# Patient Record
Sex: Female | Born: 1960 | Race: Black or African American | Hispanic: No | Marital: Married | Smoking: Former smoker
Health system: Southern US, Community
[De-identification: ages and names within clinical notes are randomized; demographics above are authoritative.]

## PROBLEM LIST (undated history)

## (undated) ENCOUNTER — Emergency Department (HOSPITAL_COMMUNITY): Discharge status: Absent without leave | Payer: BLUE CROSS/BLUE SHIELD

## (undated) DIAGNOSIS — I10 Essential (primary) hypertension: Secondary | ICD-10-CM

## (undated) DIAGNOSIS — G8929 Other chronic pain: Secondary | ICD-10-CM

## (undated) DIAGNOSIS — M79673 Pain in unspecified foot: Secondary | ICD-10-CM

## (undated) DIAGNOSIS — G4730 Sleep apnea, unspecified: Secondary | ICD-10-CM

## (undated) DIAGNOSIS — E785 Hyperlipidemia, unspecified: Secondary | ICD-10-CM

## (undated) DIAGNOSIS — M47812 Spondylosis without myelopathy or radiculopathy, cervical region: Secondary | ICD-10-CM

## (undated) DIAGNOSIS — F419 Anxiety disorder, unspecified: Secondary | ICD-10-CM

## (undated) DIAGNOSIS — E119 Type 2 diabetes mellitus without complications: Secondary | ICD-10-CM

## (undated) DIAGNOSIS — D573 Sickle-cell trait: Secondary | ICD-10-CM

## (undated) DIAGNOSIS — M779 Enthesopathy, unspecified: Secondary | ICD-10-CM

## (undated) DIAGNOSIS — M722 Plantar fascial fibromatosis: Secondary | ICD-10-CM

## (undated) DIAGNOSIS — D571 Sickle-cell disease without crisis: Secondary | ICD-10-CM

## (undated) DIAGNOSIS — G5600 Carpal tunnel syndrome, unspecified upper limb: Secondary | ICD-10-CM

## (undated) DIAGNOSIS — D649 Anemia, unspecified: Secondary | ICD-10-CM

## (undated) DIAGNOSIS — M797 Fibromyalgia: Secondary | ICD-10-CM

## (undated) DIAGNOSIS — K5190 Ulcerative colitis, unspecified, without complications: Secondary | ICD-10-CM

## (undated) HISTORY — PX: COLONOSCOPY: SHX174

## (undated) HISTORY — DX: Carpal tunnel syndrome, unspecified upper limb: G56.00

## (undated) HISTORY — DX: Sleep apnea, unspecified: G47.30

## (undated) HISTORY — DX: Other chronic pain: G89.29

## (undated) HISTORY — DX: Hyperlipidemia, unspecified: E78.5

## (undated) HISTORY — DX: Type 2 diabetes mellitus without complications: E11.9

## (undated) HISTORY — DX: Pain in unspecified foot: M79.673

## (undated) HISTORY — DX: Plantar fascial fibromatosis: M72.2

## (undated) HISTORY — DX: Anxiety disorder, unspecified: F41.9

## (undated) HISTORY — DX: Essential (primary) hypertension: I10

## (undated) HISTORY — PX: ROTATOR CUFF REPAIR: SHX139

## (undated) HISTORY — PX: ABDOMINAL HYSTERECTOMY: SHX81

## (undated) HISTORY — PX: ESOPHAGOGASTRODUODENOSCOPY: SHX1529

## (undated) HISTORY — PX: APPENDECTOMY: SHX54

## (undated) HISTORY — DX: Spondylosis without myelopathy or radiculopathy, cervical region: M47.812

## (undated) HISTORY — DX: Anemia, unspecified: D64.9

## (undated) HISTORY — DX: Sickle-cell disease without crisis: D57.1

## (undated) HISTORY — DX: Enthesopathy, unspecified: M77.9

---

## 2015-09-13 DIAGNOSIS — K51919 Ulcerative colitis, unspecified with unspecified complications: Secondary | ICD-10-CM | POA: Diagnosis not present

## 2015-09-13 DIAGNOSIS — R748 Abnormal levels of other serum enzymes: Secondary | ICD-10-CM | POA: Diagnosis not present

## 2015-10-25 DIAGNOSIS — Z87891 Personal history of nicotine dependence: Secondary | ICD-10-CM | POA: Diagnosis not present

## 2015-10-25 DIAGNOSIS — Z5181 Encounter for therapeutic drug level monitoring: Secondary | ICD-10-CM | POA: Diagnosis not present

## 2015-10-25 DIAGNOSIS — Z885 Allergy status to narcotic agent status: Secondary | ICD-10-CM | POA: Diagnosis not present

## 2015-10-25 DIAGNOSIS — K5190 Ulcerative colitis, unspecified, without complications: Secondary | ICD-10-CM | POA: Diagnosis not present

## 2015-11-15 DIAGNOSIS — R748 Abnormal levels of other serum enzymes: Secondary | ICD-10-CM | POA: Diagnosis not present

## 2015-11-26 DIAGNOSIS — Z885 Allergy status to narcotic agent status: Secondary | ICD-10-CM | POA: Diagnosis not present

## 2015-11-26 DIAGNOSIS — Z87891 Personal history of nicotine dependence: Secondary | ICD-10-CM | POA: Diagnosis not present

## 2015-11-26 DIAGNOSIS — K5190 Ulcerative colitis, unspecified, without complications: Secondary | ICD-10-CM | POA: Diagnosis not present

## 2015-12-27 DIAGNOSIS — Z885 Allergy status to narcotic agent status: Secondary | ICD-10-CM | POA: Diagnosis not present

## 2015-12-27 DIAGNOSIS — K5190 Ulcerative colitis, unspecified, without complications: Secondary | ICD-10-CM | POA: Diagnosis not present

## 2015-12-27 DIAGNOSIS — Z87891 Personal history of nicotine dependence: Secondary | ICD-10-CM | POA: Diagnosis not present

## 2016-01-25 DIAGNOSIS — K5190 Ulcerative colitis, unspecified, without complications: Secondary | ICD-10-CM | POA: Diagnosis not present

## 2016-01-25 DIAGNOSIS — Z87891 Personal history of nicotine dependence: Secondary | ICD-10-CM | POA: Diagnosis not present

## 2016-01-25 DIAGNOSIS — Z885 Allergy status to narcotic agent status: Secondary | ICD-10-CM | POA: Diagnosis not present

## 2016-02-21 DIAGNOSIS — D509 Iron deficiency anemia, unspecified: Secondary | ICD-10-CM | POA: Diagnosis not present

## 2016-02-21 DIAGNOSIS — K51919 Ulcerative colitis, unspecified with unspecified complications: Secondary | ICD-10-CM | POA: Diagnosis not present

## 2016-02-21 DIAGNOSIS — Z720 Tobacco use: Secondary | ICD-10-CM | POA: Diagnosis not present

## 2016-02-21 DIAGNOSIS — R1084 Generalized abdominal pain: Secondary | ICD-10-CM | POA: Diagnosis not present

## 2016-02-21 DIAGNOSIS — Z885 Allergy status to narcotic agent status: Secondary | ICD-10-CM | POA: Diagnosis not present

## 2016-02-21 DIAGNOSIS — Z8719 Personal history of other diseases of the digestive system: Secondary | ICD-10-CM | POA: Diagnosis not present

## 2016-03-20 DIAGNOSIS — K5190 Ulcerative colitis, unspecified, without complications: Secondary | ICD-10-CM | POA: Diagnosis not present

## 2016-03-20 DIAGNOSIS — Z885 Allergy status to narcotic agent status: Secondary | ICD-10-CM | POA: Diagnosis not present

## 2016-03-20 DIAGNOSIS — Z87891 Personal history of nicotine dependence: Secondary | ICD-10-CM | POA: Diagnosis not present

## 2016-04-18 DIAGNOSIS — K5190 Ulcerative colitis, unspecified, without complications: Secondary | ICD-10-CM | POA: Diagnosis not present

## 2016-04-18 DIAGNOSIS — Z87891 Personal history of nicotine dependence: Secondary | ICD-10-CM | POA: Diagnosis not present

## 2016-04-18 DIAGNOSIS — Z885 Allergy status to narcotic agent status: Secondary | ICD-10-CM | POA: Diagnosis not present

## 2016-05-16 DIAGNOSIS — K5100 Ulcerative (chronic) pancolitis without complications: Secondary | ICD-10-CM | POA: Diagnosis not present

## 2016-05-16 DIAGNOSIS — K2970 Gastritis, unspecified, without bleeding: Secondary | ICD-10-CM | POA: Diagnosis not present

## 2016-06-28 DIAGNOSIS — M7989 Other specified soft tissue disorders: Secondary | ICD-10-CM | POA: Diagnosis not present

## 2016-06-28 DIAGNOSIS — R0789 Other chest pain: Secondary | ICD-10-CM | POA: Diagnosis not present

## 2016-06-28 DIAGNOSIS — M25512 Pain in left shoulder: Secondary | ICD-10-CM | POA: Diagnosis not present

## 2016-06-28 DIAGNOSIS — R079 Chest pain, unspecified: Secondary | ICD-10-CM | POA: Diagnosis not present

## 2016-06-28 DIAGNOSIS — R200 Anesthesia of skin: Secondary | ICD-10-CM | POA: Diagnosis not present

## 2016-06-28 DIAGNOSIS — M79602 Pain in left arm: Secondary | ICD-10-CM | POA: Diagnosis not present

## 2016-06-28 DIAGNOSIS — K5190 Ulcerative colitis, unspecified, without complications: Secondary | ICD-10-CM | POA: Diagnosis not present

## 2016-06-29 DIAGNOSIS — R200 Anesthesia of skin: Secondary | ICD-10-CM | POA: Diagnosis not present

## 2016-06-29 DIAGNOSIS — M25512 Pain in left shoulder: Secondary | ICD-10-CM | POA: Diagnosis not present

## 2016-07-07 DIAGNOSIS — G5602 Carpal tunnel syndrome, left upper limb: Secondary | ICD-10-CM | POA: Diagnosis not present

## 2016-07-07 DIAGNOSIS — M654 Radial styloid tenosynovitis [de Quervain]: Secondary | ICD-10-CM | POA: Diagnosis not present

## 2016-07-07 DIAGNOSIS — G5601 Carpal tunnel syndrome, right upper limb: Secondary | ICD-10-CM | POA: Diagnosis not present

## 2016-07-10 ENCOUNTER — Encounter: Payer: Self-pay | Admitting: Cardiology

## 2016-07-10 DIAGNOSIS — M50122 Cervical disc disorder at C5-C6 level with radiculopathy: Secondary | ICD-10-CM | POA: Diagnosis not present

## 2016-07-10 DIAGNOSIS — M5412 Radiculopathy, cervical region: Secondary | ICD-10-CM | POA: Diagnosis not present

## 2016-07-22 DIAGNOSIS — M5412 Radiculopathy, cervical region: Secondary | ICD-10-CM | POA: Diagnosis not present

## 2016-07-22 DIAGNOSIS — G540 Brachial plexus disorders: Secondary | ICD-10-CM | POA: Diagnosis not present

## 2016-07-22 DIAGNOSIS — M542 Cervicalgia: Secondary | ICD-10-CM | POA: Diagnosis not present

## 2016-07-22 DIAGNOSIS — M792 Neuralgia and neuritis, unspecified: Secondary | ICD-10-CM | POA: Diagnosis not present

## 2016-08-01 DIAGNOSIS — G540 Brachial plexus disorders: Secondary | ICD-10-CM | POA: Diagnosis not present

## 2016-08-04 DIAGNOSIS — M654 Radial styloid tenosynovitis [de Quervain]: Secondary | ICD-10-CM | POA: Diagnosis not present

## 2016-08-04 DIAGNOSIS — G5601 Carpal tunnel syndrome, right upper limb: Secondary | ICD-10-CM | POA: Diagnosis not present

## 2016-08-04 DIAGNOSIS — G5602 Carpal tunnel syndrome, left upper limb: Secondary | ICD-10-CM | POA: Diagnosis not present

## 2016-08-13 DIAGNOSIS — M7542 Impingement syndrome of left shoulder: Secondary | ICD-10-CM | POA: Diagnosis not present

## 2016-08-13 DIAGNOSIS — M79672 Pain in left foot: Secondary | ICD-10-CM | POA: Diagnosis not present

## 2016-08-13 DIAGNOSIS — M19071 Primary osteoarthritis, right ankle and foot: Secondary | ICD-10-CM | POA: Diagnosis not present

## 2016-08-13 DIAGNOSIS — M7732 Calcaneal spur, left foot: Secondary | ICD-10-CM | POA: Diagnosis not present

## 2016-08-13 DIAGNOSIS — M5412 Radiculopathy, cervical region: Secondary | ICD-10-CM | POA: Diagnosis not present

## 2016-08-13 DIAGNOSIS — M79671 Pain in right foot: Secondary | ICD-10-CM | POA: Diagnosis not present

## 2016-08-13 DIAGNOSIS — G5603 Carpal tunnel syndrome, bilateral upper limbs: Secondary | ICD-10-CM | POA: Diagnosis not present

## 2016-08-13 DIAGNOSIS — M7731 Calcaneal spur, right foot: Secondary | ICD-10-CM | POA: Diagnosis not present

## 2016-08-13 DIAGNOSIS — G5761 Lesion of plantar nerve, right lower limb: Secondary | ICD-10-CM | POA: Diagnosis not present

## 2016-08-13 DIAGNOSIS — M722 Plantar fascial fibromatosis: Secondary | ICD-10-CM | POA: Diagnosis not present

## 2016-08-15 DIAGNOSIS — M5412 Radiculopathy, cervical region: Secondary | ICD-10-CM | POA: Diagnosis not present

## 2016-08-15 DIAGNOSIS — M792 Neuralgia and neuritis, unspecified: Secondary | ICD-10-CM | POA: Diagnosis not present

## 2016-08-15 DIAGNOSIS — G540 Brachial plexus disorders: Secondary | ICD-10-CM | POA: Diagnosis not present

## 2016-08-15 DIAGNOSIS — M542 Cervicalgia: Secondary | ICD-10-CM | POA: Diagnosis not present

## 2016-08-20 DIAGNOSIS — N63 Unspecified lump in unspecified breast: Secondary | ICD-10-CM | POA: Diagnosis not present

## 2016-08-20 DIAGNOSIS — N6459 Other signs and symptoms in breast: Secondary | ICD-10-CM | POA: Diagnosis not present

## 2016-08-20 DIAGNOSIS — R922 Inconclusive mammogram: Secondary | ICD-10-CM | POA: Diagnosis not present

## 2016-08-20 DIAGNOSIS — R928 Other abnormal and inconclusive findings on diagnostic imaging of breast: Secondary | ICD-10-CM | POA: Diagnosis not present

## 2016-08-25 DIAGNOSIS — G5603 Carpal tunnel syndrome, bilateral upper limbs: Secondary | ICD-10-CM | POA: Diagnosis not present

## 2016-09-10 DIAGNOSIS — R202 Paresthesia of skin: Secondary | ICD-10-CM | POA: Diagnosis not present

## 2016-09-10 DIAGNOSIS — M7741 Metatarsalgia, right foot: Secondary | ICD-10-CM | POA: Diagnosis not present

## 2016-09-10 DIAGNOSIS — G5762 Lesion of plantar nerve, left lower limb: Secondary | ICD-10-CM | POA: Diagnosis not present

## 2016-09-10 DIAGNOSIS — M7661 Achilles tendinitis, right leg: Secondary | ICD-10-CM | POA: Diagnosis not present

## 2016-10-13 DIAGNOSIS — Z1389 Encounter for screening for other disorder: Secondary | ICD-10-CM | POA: Diagnosis not present

## 2016-10-13 DIAGNOSIS — N390 Urinary tract infection, site not specified: Secondary | ICD-10-CM | POA: Diagnosis not present

## 2016-10-13 DIAGNOSIS — Z1231 Encounter for screening mammogram for malignant neoplasm of breast: Secondary | ICD-10-CM | POA: Diagnosis not present

## 2016-10-13 DIAGNOSIS — R918 Other nonspecific abnormal finding of lung field: Secondary | ICD-10-CM | POA: Diagnosis not present

## 2016-10-13 DIAGNOSIS — R945 Abnormal results of liver function studies: Secondary | ICD-10-CM | POA: Diagnosis not present

## 2017-02-06 ENCOUNTER — Encounter (HOSPITAL_COMMUNITY): Payer: Self-pay | Admitting: Family Medicine

## 2017-02-06 ENCOUNTER — Ambulatory Visit (HOSPITAL_COMMUNITY)
Admission: EM | Admit: 2017-02-06 | Discharge: 2017-02-06 | Disposition: A | Discharge status: Home / Self Care | Payer: Medicare Other | Attending: Internal Medicine | Admitting: Internal Medicine

## 2017-02-06 DIAGNOSIS — M79672 Pain in left foot: Secondary | ICD-10-CM | POA: Diagnosis not present

## 2017-02-06 DIAGNOSIS — M2012 Hallux valgus (acquired), left foot: Secondary | ICD-10-CM

## 2017-02-06 DIAGNOSIS — M722 Plantar fascial fibromatosis: Secondary | ICD-10-CM | POA: Diagnosis not present

## 2017-02-06 DIAGNOSIS — M79671 Pain in right foot: Secondary | ICD-10-CM

## 2017-02-06 DIAGNOSIS — M2011 Hallux valgus (acquired), right foot: Secondary | ICD-10-CM | POA: Diagnosis not present

## 2017-02-06 HISTORY — DX: Ulcerative colitis, unspecified, without complications: K51.90

## 2017-02-06 MED ORDER — MELOXICAM 15 MG PO TABS: 15.0000 mg | ORAL_TABLET | Freq: Every day | ORAL | 0 refills | Status: DC

## 2017-02-06 NOTE — ED Provider Notes (Signed)
CSN: [id number]     Arrival date & time 02/06/17  1311 History   First MD Initiated Contact with Patient 02/06/17 1600     Chief Complaint  Patient presents with  . Foot Pain   (Consider location/radiation/quality/duration/timing/severity/associated sxs/prior Treatment) 56 year old female who moved to the area from [place] approximately 2 months ago. She is complaining of feet pain and swelling for 2-3 weeks. She has a history of plantar fasciitis bilaterally. She saw a podiatrist approximate 6 months ago in [place] and was given injections into the heels of her feet. She was advised to use cold, rolling the bottom of her feet over a frozen can as well as to wear shoes supports. She describes supports that sound like it is more for heel cushion than actual arch support as recommended for plantar fasciitis. Denies any known injury. Denies being on her feet for prolonged period of time.      Past Medical History:  Diagnosis Date  . UC (ulcerative colitis) ([place])    History reviewed. No pertinent surgical history. History reviewed. No pertinent family history. Social History  Substance Use Topics  . Smoking status: Never Smoker  . Smokeless tobacco: Never Used  . Alcohol use Not on file   OB History    No data available     Review of Systems  Constitutional: Negative.  Negative for activity change, chills and fever.  HENT: Negative.   Respiratory: Negative.   Cardiovascular: Negative.   Musculoskeletal:       As per HPI  Skin: Negative for color change, pallor and rash.  Neurological: Negative.   All other systems reviewed and are negative.   Allergies  Codeine and Latex  Home Medications   Prior to Admission medications   Medication Sig Start Date End Date Taking? Authorizing Provider  meloxicam (MOBIC) 15 MG tablet Take 1 tablet (15 mg total) by mouth daily. Prn pain 02/06/17   Janne Napoleon, NP   Meds Ordered and Administered this Visit  Medications - No data to  display  BP 135/82 (BP Location: Right Arm)   Pulse 87   Temp 98.6 F (37 C) (Oral)   Resp 16   SpO2 99%  No data found.   Physical Exam  Constitutional: She is oriented to person, place, and time. She appears well-developed and well-nourished. No distress.  HENT:  Head: Normocephalic and atraumatic.  Eyes: EOM are normal. Pupils are equal, round, and reactive to light.  Neck: Neck supple.  Pulmonary/Chest: Effort normal.  Musculoskeletal: She exhibits tenderness. She exhibits no edema.  Today the feet have no evidence of swelling. The plantar aspect of the heels are tender, left greater than right. There is tenderness to the dorsum of both feet. Tenderness to the base of the great toes over the MCPs. Both toes demonstrate hallux valgus. No other obvious deformities. Normal warmth and color. Pedal pulse 2+. No lesions of the skin.  Neurological: She is alert and oriented to person, place, and time. No cranial nerve deficit.  Skin: Skin is warm and dry. Capillary refill takes less than 2 seconds.  Psychiatric: She has a normal mood and affect.  Nursing note and vitals reviewed.   Urgent Care Course     Procedures (including critical care time)  Labs Review Labs Reviewed - No data to display  Imaging Review No results found.   Visual Acuity Review  Right Eye Distance:   Left Eye Distance:   Bilateral Distance:    Right Eye  Near:   Left Eye Near:    Bilateral Near:         MDM   1. Plantar fasciitis, bilateral   2. Hallux valgus, acquired, bilateral   3. Foot pain, bilateral   Injections were offered but explanation of the procedure resulted in the choice for more conservative tx first. Obtain the full-length shoe inserts with a high arch and wear them whenever you are walking. Rolled the bottom of your feet over frozen can 3 or 4 times a day. Follow-up with podiatrist in call for an appointment early. Meds ordered this encounter  Medications  . meloxicam  (MOBIC) 15 MG tablet    Sig: Take 1 tablet (15 mg total) by mouth daily. Prn pain    Dispense:  14 tablet    Refill:  0    Order Specific Question:   Supervising Provider    Answer:   Sherlene Shams [[id number]]       Janne Napoleon, NP 02/06/17 [phone]

## 2017-02-06 NOTE — ED Triage Notes (Signed)
Pt here for bilateral foot pain x 3 weeks. sts more on the left side. sts she has been soaking them.  sts she previously saw a podiatrist and used to get shots.

## 2017-02-06 NOTE — Discharge Instructions (Signed)
Obtain the full-length shoe inserts with a high arch and wear them whenever you are walking. Rolled the bottom of your feet over frozen can 3 or 4 times a day. Follow-up with podiatrist in call for an appointment early.

## 2017-02-12 DIAGNOSIS — K51019 Ulcerative (chronic) pancolitis with unspecified complications: Secondary | ICD-10-CM | POA: Diagnosis not present

## 2017-02-13 DIAGNOSIS — G5761 Lesion of plantar nerve, right lower limb: Secondary | ICD-10-CM | POA: Diagnosis not present

## 2017-02-13 DIAGNOSIS — M7661 Achilles tendinitis, right leg: Secondary | ICD-10-CM | POA: Diagnosis not present

## 2017-02-13 DIAGNOSIS — M7662 Achilles tendinitis, left leg: Secondary | ICD-10-CM | POA: Diagnosis not present

## 2017-02-14 DIAGNOSIS — R109 Unspecified abdominal pain: Secondary | ICD-10-CM | POA: Diagnosis not present

## 2017-02-14 DIAGNOSIS — R197 Diarrhea, unspecified: Secondary | ICD-10-CM | POA: Diagnosis not present

## 2017-05-05 ENCOUNTER — Encounter (HOSPITAL_COMMUNITY): Payer: Self-pay | Admitting: Emergency Medicine

## 2017-05-05 ENCOUNTER — Ambulatory Visit (HOSPITAL_COMMUNITY)
Admission: EM | Admit: 2017-05-05 | Discharge: 2017-05-05 | Disposition: A | Discharge status: Home / Self Care | Payer: Medicare Other | Attending: Emergency Medicine | Admitting: Emergency Medicine

## 2017-05-05 DIAGNOSIS — R21 Rash and other nonspecific skin eruption: Secondary | ICD-10-CM

## 2017-05-05 MED ORDER — METHYLPREDNISOLONE ACETATE 80 MG/ML IJ SUSP
INTRAMUSCULAR | Status: AC
  Filled 2017-05-05: qty 1

## 2017-05-05 MED ORDER — METHYLPREDNISOLONE ACETATE 80 MG/ML IJ SUSP
80.0000 mg | Freq: Once | INTRAMUSCULAR | Status: AC
  Administered 2017-05-05: 80 mg via INTRAMUSCULAR

## 2017-05-05 MED ORDER — PREDNISONE 10 MG (21) PO TBPK: ORAL_TABLET | ORAL | 0 refills | Status: DC

## 2017-05-05 MED ORDER — TRIAMCINOLONE ACETONIDE 0.1 % EX CREA: 1.0000 "application " | TOPICAL_CREAM | Freq: Two times a day (BID) | CUTANEOUS | 2 refills | Status: DC

## 2017-05-05 MED ORDER — HYDROXYZINE HCL 25 MG PO TABS: 25.0000 mg | ORAL_TABLET | Freq: Four times a day (QID) | ORAL | 0 refills | Status: DC

## 2017-05-05 NOTE — ED Provider Notes (Signed)
  [place]   [id number] 05/05/17 Arrival Time: 1656   SUBJECTIVE:  Beverly Ramirez is a 56 y.o. female who presents to the urgent care with complaint of pruritic rash that is been ongoing for 2 weeks and worsening. States his symptoms started after working in her flower bed, has no new medications, new soaps, new detergents, or other sources allergen, no other person in her family is currently experiencing similar symptoms. She has no fever or chills, nausea or vomiting, or other systemic symptoms  ROS: As per HPI, remainder of ROS negative.   OBJECTIVE:  Vitals:   05/05/17 1734 05/05/17 1735  BP: (!) 138/93   Pulse: 82   Resp: 16   Temp: 98.3 F (36.8 C)   TempSrc: Oral   SpO2: 100%   Weight:  165 lb (74.8 kg)  Height:  5' 2"  (1.575 m)     General appearance: alert; no distress HEENT: normocephalic; atraumatic; conjunctivae normal;  Lungs: clear to auscultation bilaterally Heart: regular rate and rhythm Abdomen: soft, non-tender Musculoskeletal: Grossly symmetrical Skin: warm and dry, multiple, erythemic, scaling lesions on the hands, arms, legs, and feet Neurologic: Grossly normal Psychological:  alert and cooperative; normal mood and affect     ASSESSMENT & PLAN:  1. Rash and nonspecific skin eruption     Meds ordered this encounter  Medications  . methylPREDNISolone acetate (DEPO-MEDROL) injection 80 mg  . predniSONE (STERAPRED UNI-PAK 21 TAB) 10 MG (21) TBPK tablet    Sig: Take 6 tablets tomorrow, decrease by 1 each day till finished (6,5,4,3,2,1)    Dispense:  21 tablet    Refill:  0    Order Specific Question:   Supervising Provider    Answer:   Melynda Ripple [[id number]]  . triamcinolone cream (KENALOG) 0.1 %    Sig: Apply 1 application topically 2 (two) times daily.    Dispense:  30 g    Refill:  2    Order Specific Question:   Supervising Provider    Answer:   Melynda Ripple [[id number]]  . hydrOXYzine (ATARAX/VISTARIL) 25 MG tablet    Sig: Take 1 tablet (25 mg total) by mouth every 6 (six) hours.    Dispense:  30 tablet    Refill:  0    Order Specific Question:   Supervising Provider    Answer:   Melynda Ripple [[id number]]    Reviewed expectations re: course of current medical issues. Questions answered. Outlined signs and symptoms indicating need for more acute intervention. Patient verbalized understanding. After Visit Summary given.    Procedures:     No results found for this or any previous visit.  Labs Reviewed - No data to display  No results found.  Allergies  Allergen Reactions  . Codeine   . Latex     PMHx, SurgHx, SocialHx, Medications, and Allergies were reviewed in the Visit Navigator and updated as appropriate.       Barnet Glasgow, NP 05/05/17 1840

## 2017-05-05 NOTE — Discharge Instructions (Signed)
I have attached the contact information for [facility], contact them to schedule an appointment to establish for primary care.   For your rash, you have been given injection of Depo-Medrol here, started on a prednisone taper to take starting tomorrow, and for itching Atarax, one tablet every 6 hours as needed. This may cause drowsiness, do not drink alcohol, and do not drive until this medicine affects you.  For your hands, and given you Kenalog cream, apply 2-3 times a day as needed

## 2017-05-05 NOTE — ED Triage Notes (Signed)
PT reports an itchy rash on arms and legs for two weeks. It started after working in her flower beds.

## 2017-05-13 ENCOUNTER — Encounter: Payer: Self-pay | Admitting: Internal Medicine

## 2017-05-26 ENCOUNTER — Ambulatory Visit (INDEPENDENT_AMBULATORY_CARE_PROVIDER_SITE_OTHER): Payer: Medicare Other

## 2017-05-26 ENCOUNTER — Encounter: Payer: Self-pay | Admitting: Podiatry

## 2017-05-26 ENCOUNTER — Ambulatory Visit (INDEPENDENT_AMBULATORY_CARE_PROVIDER_SITE_OTHER): Payer: Medicare Other | Admitting: Podiatry

## 2017-05-26 DIAGNOSIS — M722 Plantar fascial fibromatosis: Secondary | ICD-10-CM | POA: Diagnosis not present

## 2017-05-26 DIAGNOSIS — G8929 Other chronic pain: Secondary | ICD-10-CM | POA: Diagnosis not present

## 2017-05-26 DIAGNOSIS — M79673 Pain in unspecified foot: Secondary | ICD-10-CM

## 2017-05-26 MED ORDER — MELOXICAM 15 MG PO TABS: 15.0000 mg | ORAL_TABLET | Freq: Every day | ORAL | 2 refills | Status: DC

## 2017-05-26 NOTE — Patient Instructions (Signed)

## 2017-05-26 NOTE — Progress Notes (Signed)
   Subjective:    Patient ID: Beverly Ramirez, female    DOB: 1961-04-10, 56 y.o.   MRN: [id number]  HPI  56 year old female presents the office today for concerns of bilateral heel pain. She previously was being treated by a physician in [place]. She states that when she was there she had one injection to the bottom of the heels which did help some but she did not want another injection. She also had a wrap to her foot. She is also dispensed a night splint which did not help. She also appears to his diagnosis the neuroma however she was having no symptoms this point. Her main concern is pain in the bottom of the heels which is been ongoing for about 1.5 years but over the last years become more continual to the bottom of the heel. She describes a throbbing, sharp sensation of the heel. She denies a sharp, shooting symptoms. She denies any swelling or redness. The pain does not wake her up at night. She has no other concerns. Review of Systems  All other systems reviewed and are negative.      Objective:   Physical Exam General: AAO x3, NAD  Dermatological: Skin is warm, dry and supple bilateral. Nails x 10 are well manicured; remaining integument appears unremarkable at this time. There are no open sores, no preulcerative lesions, no rash or signs of infection present.  Vascular: Dorsalis Pedis artery and Posterior Tibial artery pedal pulses are 2/4 bilateral with immedate capillary fill time.There is no pain with calf compression, swelling, warmth, erythema.   Neruologic: Grossly intact via light touch bilateral.  Protective threshold with Semmes Wienstein monofilament intact to all pedal sites bilateral. Negative tinel sign.   Musculoskeletal: Tenderness to palpation along the plantar medial tubercle of the calcaneus at the insertion of plantar fascia on the left and right foot. There is no pain along the course of the plantar fascia within the arch of the foot. Plantar fascia appears to  be intact. There is no pain with lateral compression of the calcaneus or pain with vibratory sensation. There is no pain along the course or insertion of the achilles tendon. No other areas of tenderness to bilateral lower extremities. Muscular strength 5/5 in all groups tested bilateral. There is no pain on the interspaces is no palpable neuroma identified today.  Gait: Unassisted, Nonantalgic.      Assessment & Plan:  56 year old female presents the office they for bilateral heel pain, chronic likely plantar fasciitis  -Treatment options discussed including all alternatives, risks, and complications -Etiology of symptoms were discussed -X-rays were obtained and reviewed with the patient. No evidence of acute fracture identified.  -Prescribed mobic. Discussed side effects of the medication and directed to stop if any are to occur and call the office.  -She wishes to hold off on another steroid injection -Stretching, icing exercises daily.  -Plantar fascial brace dispensed bilaterally  -I do think that should benefit from custom orthotics given her chronic nature the plantar fasciitis. She has multiple with orthotics today. She was aware of the charge.  -RTC 3 weeks or sooner if needed.   Celesta Gentile, DPM

## 2017-05-27 DIAGNOSIS — M79673 Pain in unspecified foot: Secondary | ICD-10-CM

## 2017-05-28 DIAGNOSIS — M722 Plantar fascial fibromatosis: Secondary | ICD-10-CM | POA: Insufficient documentation

## 2017-05-30 ENCOUNTER — Ambulatory Visit (HOSPITAL_COMMUNITY)
Admission: EM | Admit: 2017-05-30 | Discharge: 2017-05-30 | Disposition: A | Discharge status: Home / Self Care | Payer: Medicare Other | Attending: Family Medicine | Admitting: Family Medicine

## 2017-05-30 ENCOUNTER — Ambulatory Visit (INDEPENDENT_AMBULATORY_CARE_PROVIDER_SITE_OTHER): Payer: Medicare Other

## 2017-05-30 ENCOUNTER — Encounter (HOSPITAL_COMMUNITY): Payer: Self-pay | Admitting: Family Medicine

## 2017-05-30 DIAGNOSIS — S6992XA Unspecified injury of left wrist, hand and finger(s), initial encounter: Secondary | ICD-10-CM | POA: Diagnosis not present

## 2017-05-30 DIAGNOSIS — M25532 Pain in left wrist: Secondary | ICD-10-CM

## 2017-05-30 IMAGING — DX DG WRIST COMPLETE 3+V*L*
3 series · 3 of 3 positions shown · non-contrast
Comparison: None.

CLINICAL DATA: Fall down steps today injuring left wrist.

EXAM:
LEFT WRIST - COMPLETE 3+ VIEW

[wrist pa]
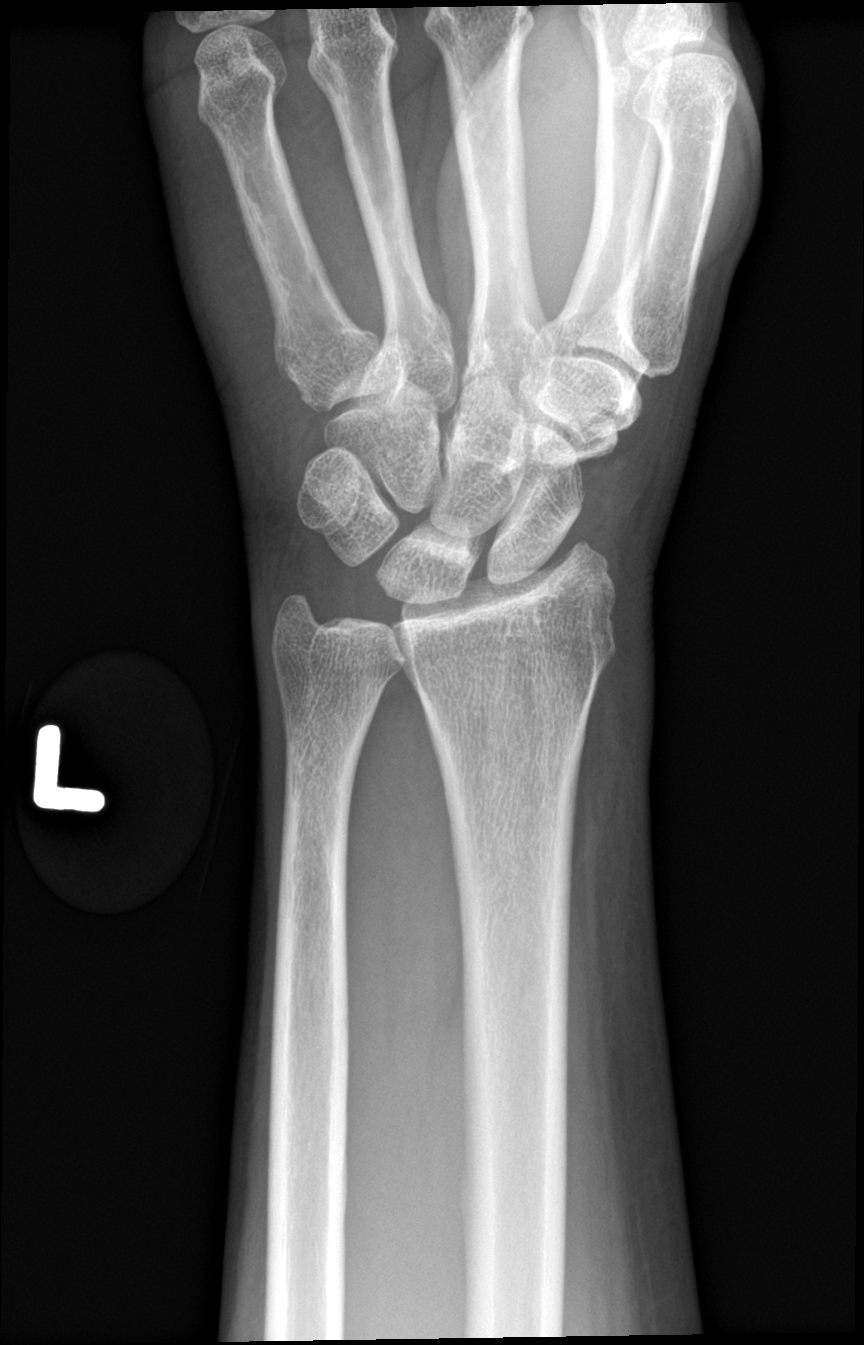

[wrist obl]
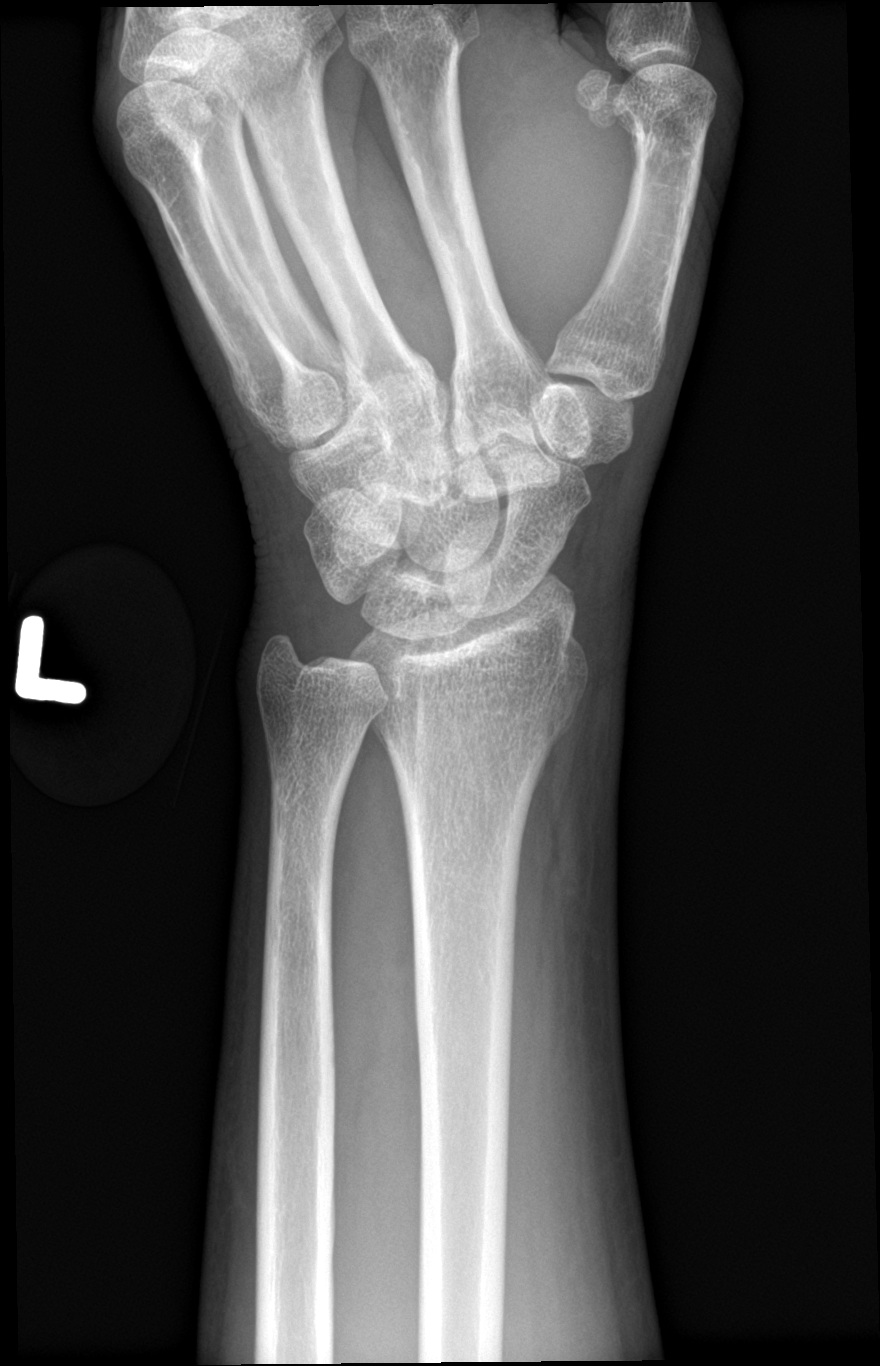

[wrist lat]
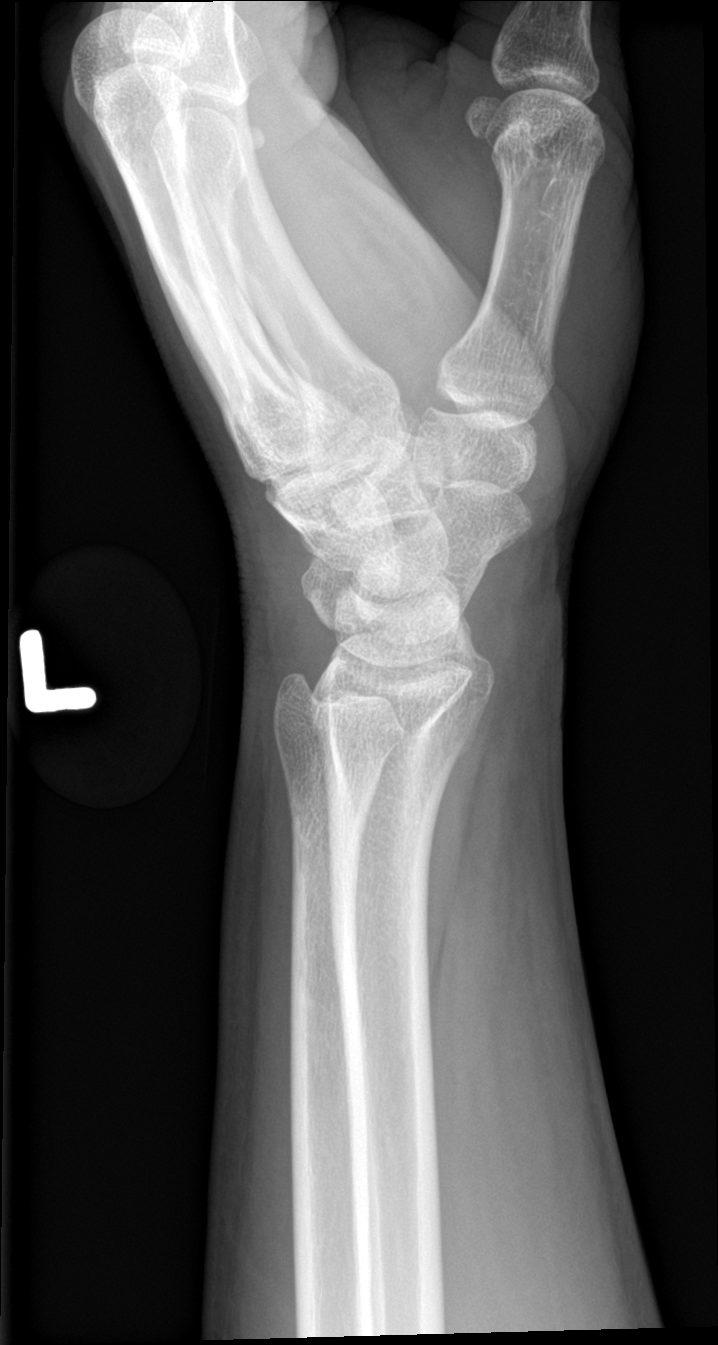

[3 of 3 positions shown; findings below may reference images not displayed]

FINDINGS: There is no evidence of fracture or dislocation. There is no
evidence of arthropathy or other focal bone abnormality. Soft
tissues are unremarkable.
IMPRESSION: Negative.

## 2017-05-30 NOTE — ED Notes (Signed)
No answer

## 2017-05-30 NOTE — ED Triage Notes (Signed)
Per pt she had a fall today mopping and landed on left arm. Pain in hand, wrist and FA.

## 2017-06-01 NOTE — ED Provider Notes (Signed)
  [place]   [id number] 05/30/17 Arrival Time: 2637  ASSESSMENT & PLAN:  1. Wrist pain, left    Placed in wrist splint to wear for at least one week. OTC analgesics as needed. F/U if needed. No fracture seen on x-rays tonight. Reviewed expectations re: course of current medical issues. Questions answered. Outlined signs and symptoms indicating need for more acute intervention. Patient verbalized understanding. After Visit Summary given.   SUBJECTIVE:  Beverly Ramirez is a 56 y.o. female who presents with complaint of L wrist discomfort after falling and landing on the side of her L arm. Gradual onset. Worse with certain movement. Better when holding still. No analgesics taken. No arm sensation changes. No previous injury reported.  ROS: As per HPI.   OBJECTIVE:  Vitals:   05/30/17 1820  BP: (!) 148/86  Pulse: 71  Resp: 18  Temp: 98.6 F (37 C)  SpO2: 100%    General appearance: alert; no distress Extremities: L forearm with tenderness over wrist but poorly localized; FROM with discomfort; normal sensation and grip; normal capillary refill Skin: warm and dry Psychological: alert and cooperative; normal mood and affect  Imaging: Dg Wrist Complete Left  Result Date: 05/30/2017 CLINICAL DATA:  Fall down steps today injuring left wrist. EXAM: LEFT WRIST - COMPLETE 3+ VIEW COMPARISON:  None. FINDINGS: There is no evidence of fracture or dislocation. There is no evidence of arthropathy or other focal bone abnormality. Soft tissues are unremarkable. IMPRESSION: Negative. Electronically Signed   By: Marin Olp M.D.   On: 05/30/2017 19:17    Allergies  Allergen Reactions  . Codeine Itching, Rash and Hives  . Latex     Past Medical History:  Diagnosis Date  . UC (ulcerative colitis) [facility])    Social History   Social History  . Marital status: Married    Spouse name: N/A  . Number of children: N/A  . Years of education: N/A   Occupational History    . Not on file.   Social History Main Topics  . Smoking status: Never Smoker  . Smokeless tobacco: Never Used  . Alcohol use Not on file  . Drug use: Unknown  . Sexual activity: Not on file   Other Topics Concern  . Not on file   Social History Narrative  . No narrative on file      Vanessa Kick, MD 06/01/17 [phone]

## 2017-06-10 ENCOUNTER — Encounter (HOSPITAL_COMMUNITY): Payer: Self-pay | Admitting: Emergency Medicine

## 2017-06-10 ENCOUNTER — Ambulatory Visit (INDEPENDENT_AMBULATORY_CARE_PROVIDER_SITE_OTHER): Payer: Medicare Other

## 2017-06-10 ENCOUNTER — Ambulatory Visit (HOSPITAL_COMMUNITY)
Admission: EM | Admit: 2017-06-10 | Discharge: 2017-06-10 | Disposition: A | Discharge status: Home / Self Care | Payer: Medicare Other | Attending: Family Medicine | Admitting: Family Medicine

## 2017-06-10 DIAGNOSIS — M25532 Pain in left wrist: Secondary | ICD-10-CM

## 2017-06-10 IMAGING — DX DG WRIST COMPLETE 3+V*L*
4 series · 4 of 4 positions shown · non-contrast
Comparison: Radiographs [DATE].

CLINICAL DATA: Left wrist pain after fall down stairs 1 month ago.

EXAM:
LEFT WRIST - COMPLETE 3+ VIEW

[wrist pa]
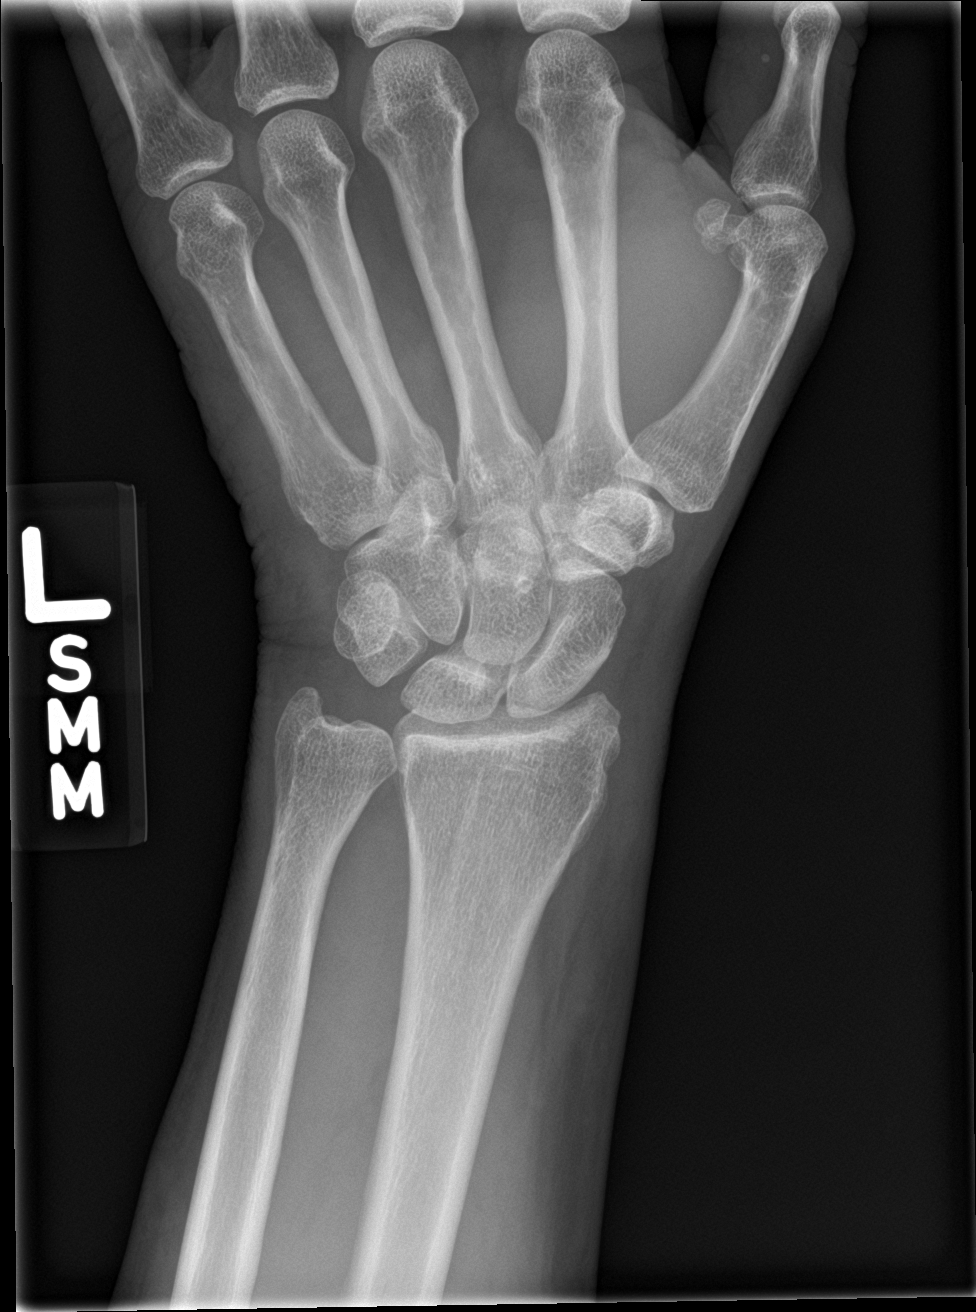

[wrist navicular]
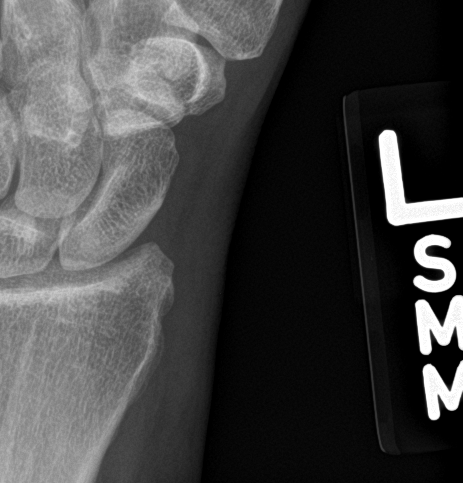

[wrist obl]
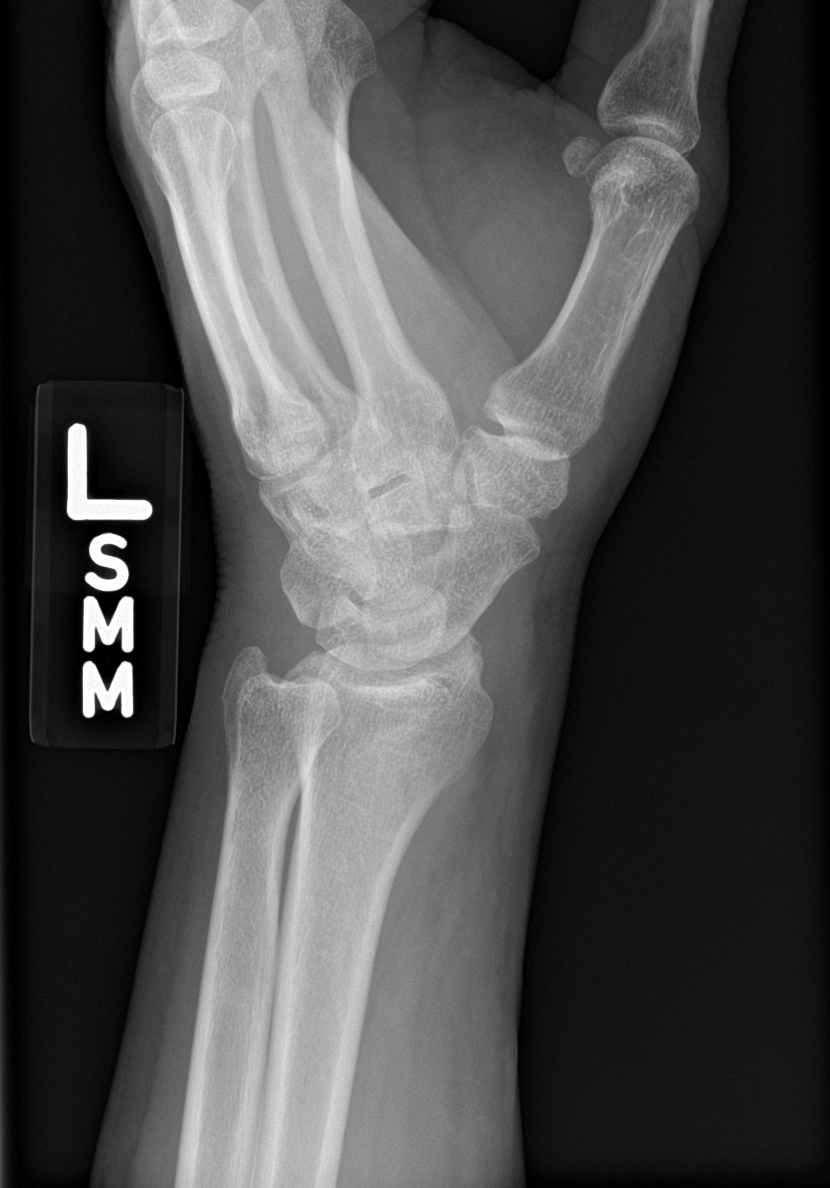

[wrist lat]
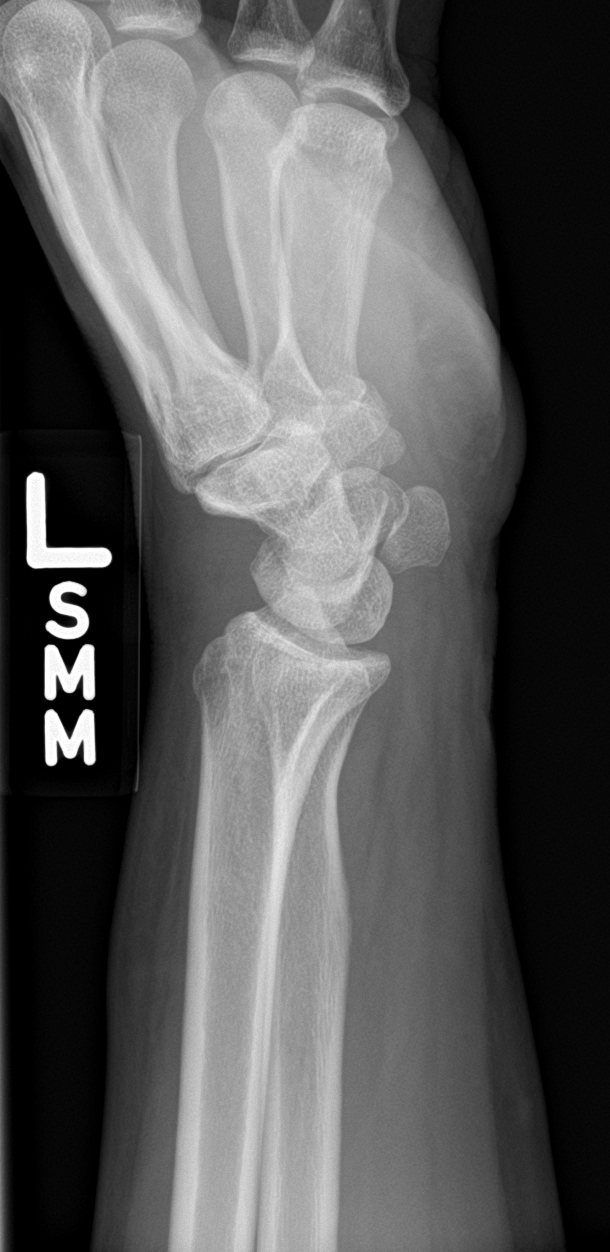

[4 of 4 positions shown; findings below may reference images not displayed]

FINDINGS: There is no evidence of fracture or dislocation. There is no
evidence of arthropathy or other focal bone abnormality. Soft
tissues are unremarkable.
IMPRESSION: Normal left wrist.

## 2017-06-10 MED ORDER — PREDNISONE 10 MG (21) PO TBPK: ORAL_TABLET | Freq: Every day | ORAL | 0 refills | Status: DC

## 2017-06-10 NOTE — ED Triage Notes (Signed)
PT reports she was seen here 2 weeks ago for left wrist pain. PT has been wearing her brace. Pain is not improving.

## 2017-06-13 NOTE — ED Provider Notes (Signed)
  [place]   [id number] 06/10/17 Arrival Time: 9191  ASSESSMENT & PLAN:  1. Left wrist pain     Meds ordered this encounter  Medications  . predniSONE (STERAPRED UNI-PAK 21 TAB) 10 MG (21) TBPK tablet    Sig: Take by mouth daily. Take as directed.    Dispense:  21 tablet    Refill:  0   Stop wearing wrist splint. Trial of prednisone. Will need orthopaedic evaluation if not improving. Reviewed expectations re: course of current medical issues. Questions answered. Outlined signs and symptoms indicating need for more acute intervention. Patient verbalized understanding. After Visit Summary given.   SUBJECTIVE:  Beverly Ramirez is a 56 y.o. female who presents with complaint of continuing L wrist discomfort. Wearing splint. See last note. No better but no worse. "Feels stiff." Has not been moving it much. No extremity sensation changes or weakness. No specific aggravating or alleviating factors reported.  ROS: As per HPI.   OBJECTIVE:  Vitals:   06/10/17 1447 06/10/17 1448  BP: 124/88   Pulse: 69   Resp: 16   Temp: 98.5 F (36.9 C)   TempSrc: Oral   SpO2: 100%   Weight:  165 lb (74.8 kg)  Height:  5' 2"  (1.575 m)    General appearance: alert; no distress Extremities: L wrist with vague tenderness on exam; FROM; no gross abnormalities Skin: warm and dry Neurologic: normal gait; normal symmetric reflexes Psychological: alert and cooperative; normal mood and affect  Imaging: Dg Wrist Complete Left  Result Date: 06/10/2017 CLINICAL DATA:  Left wrist pain after fall down stairs 1 month ago. EXAM: LEFT WRIST - COMPLETE 3+ VIEW COMPARISON:  Radiographs of May 30, 2017. FINDINGS: There is no evidence of fracture or dislocation. There is no evidence of arthropathy or other focal bone abnormality. Soft tissues are unremarkable. IMPRESSION: Normal left wrist. Electronically Signed   By: Marijo Conception, M.D.   On: 06/10/2017 15:44    Allergies    Allergen Reactions  . Codeine Itching, Rash and Hives  . Latex     Past Medical History:  Diagnosis Date  . UC (ulcerative colitis) [facility])    Social History   Social History  . Marital status: Married    Spouse name: N/A  . Number of children: N/A  . Years of education: N/A   Occupational History  . Not on file.   Social History Main Topics  . Smoking status: Never Smoker  . Smokeless tobacco: Never Used  . Alcohol use No  . Drug use: No  . Sexual activity: Not on file   Other Topics Concern  . Not on file   Social History Narrative  . No narrative on file   No family history on file. Past Surgical History:  Procedure Laterality Date  . ABDOMINAL HYSTERECTOMY    . APPENDECTOMY    . CESAREAN SECTION       Vanessa Kick, MD 06/13/17 [phone]

## 2017-06-22 ENCOUNTER — Encounter: Payer: Self-pay | Admitting: Obstetrics & Gynecology

## 2017-06-22 ENCOUNTER — Ambulatory Visit (INDEPENDENT_AMBULATORY_CARE_PROVIDER_SITE_OTHER): Payer: Medicare Other | Admitting: Obstetrics & Gynecology

## 2017-06-22 ENCOUNTER — Other Ambulatory Visit: Payer: Self-pay | Admitting: Obstetrics & Gynecology

## 2017-06-22 VITALS — BP 136/88 | Ht 62.5 in | Wt 164.0 lb

## 2017-06-22 DIAGNOSIS — N951 Menopausal and female climacteric states: Secondary | ICD-10-CM

## 2017-06-22 DIAGNOSIS — Z90711 Acquired absence of uterus with remaining cervical stump: Secondary | ICD-10-CM

## 2017-06-22 DIAGNOSIS — Z01411 Encounter for gynecological examination (general) (routine) with abnormal findings: Secondary | ICD-10-CM | POA: Diagnosis not present

## 2017-06-22 DIAGNOSIS — E2839 Other primary ovarian failure: Secondary | ICD-10-CM

## 2017-06-22 DIAGNOSIS — Z124 Encounter for screening for malignant neoplasm of cervix: Secondary | ICD-10-CM | POA: Diagnosis not present

## 2017-06-22 DIAGNOSIS — Z1231 Encounter for screening mammogram for malignant neoplasm of breast: Secondary | ICD-10-CM

## 2017-06-22 MED ORDER — ESTRADIOL 0.05 MG/24HR TD PTWK: 0.0500 mg | MEDICATED_PATCH | TRANSDERMAL | 4 refills | Status: DC

## 2017-06-22 NOTE — Progress Notes (Signed)
Beverly Ramirez 07-30-1961 [id number]   History:    56 y.o.  G61P4  Married  RP:  New patient presenting for annual gyn exam   HPI:  S/P Supracervical Hysterectomy for Fibroids.  Menopausal Sxs +++.  No HRT.  No pelvic pain.  Ulcerative Colitis under control on Meds.  Breasts wnl.  Mictions normal.  Past medical history,surgical history, family history and social history were all reviewed and documented in the EPIC chart.  Gynecologic History No LMP recorded. Patient has had a hysterectomy. Contraception: status post hysterectomy Last Pap: 2017. Results were: normal Last mammogram: 01/2016. Results were: normal [place] 2017 No Bone density yet  Obstetric History OB History  Gravida Para Term Preterm AB Living  4 4       4   SAB TAB Ectopic Multiple Live Births               # Outcome Date GA Lbr Len/2nd Weight Sex Delivery Anes PTL Lv  4 Para           3 Para           2 Para           1 Para                ROS: A ROS was performed and pertinent positives and negatives are included in the history.  GENERAL: No fevers or chills. HEENT: No change in vision, no earache, sore throat or sinus congestion. NECK: No pain or stiffness. CARDIOVASCULAR: No chest pain or pressure. No palpitations. PULMONARY: No shortness of breath, cough or wheeze. GASTROINTESTINAL: No abdominal pain, nausea, vomiting or diarrhea, melena or bright red blood per rectum. GENITOURINARY: No urinary frequency, urgency, hesitancy or dysuria. MUSCULOSKELETAL: No joint or muscle pain, no back pain, no recent trauma. DERMATOLOGIC: No rash, no itching, no lesions. ENDOCRINE: No polyuria, polydipsia, no heat or cold intolerance. No recent change in weight. HEMATOLOGICAL: No anemia or easy bruising or bleeding. NEUROLOGIC: No headache, seizures, numbness, tingling or weakness. PSYCHIATRIC: No depression, no loss of interest in normal activity or change in sleep pattern.     Exam:   BP 136/88   Ht 5' 2.5"  (1.588 m)   Wt 164 lb (74.4 kg)   BMI 29.52 kg/m   Body mass index is 29.52 kg/m.  General appearance : Well developed well nourished female. No acute distress HEENT: Eyes: no retinal hemorrhage or exudates,  Neck supple, trachea midline, no carotid bruits, no thyroidmegaly Lungs: Clear to auscultation, no rhonchi or wheezes, or rib retractions  Heart: Regular rate and rhythm, no murmurs or gallops Breast:Examined in sitting and supine position were symmetrical in appearance, no palpable masses or tenderness,  no skin retraction, no nipple inversion, no nipple discharge, no skin discoloration, no axillary or supraclavicular lymphadenopathy Abdomen: no palpable masses or tenderness, no rebound or guarding Extremities: no edema or skin discoloration or tenderness  Pelvic: Vulva normal  Bartholin, Urethra, Skene Glands: Within normal limits             Vagina: No gross lesions or discharge  Cervix: No gross lesions or discharge.  Pap reflex done.  Uterus  Absent  Adnexa  Without masses or tenderness  Anus and perineum  normal      Assessment/Plan:  56 y.o. female for annual exam   1. Encounter for gynecological examination with abnormal finding Gyn exam s/p SupraCervical Hysterectomy.  Pap reflex done.  Breasts wnl.  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  S/P Supracervical Hysterectomy.  No CI to HRT.  Risks/Benefits reviewed thoroughly.  Risks of blood clot/Stroke and Breast Ca discussed.  Benefits on CVS, Bones, Sexual activity, skin and overall enjoyment of life reviewed.  Start on Estradiol patch 0.05 continuous use, changing patch every week.  Will schedule Bone Density.  Vit D supplements/Ca++ in food/Weight Bearing physical activity. - DG Bone Density; Future  3. H/O abdominal supracervical subtotal hysterectomy  Counseling on above issues >50% x 20 minutes.  Princess Bruins MD,  11:40 AM 06/22/2017

## 2017-06-23 ENCOUNTER — Ambulatory Visit (INDEPENDENT_AMBULATORY_CARE_PROVIDER_SITE_OTHER): Payer: Self-pay | Admitting: Orthotics

## 2017-06-23 DIAGNOSIS — M79673 Pain in unspecified foot: Secondary | ICD-10-CM

## 2017-06-23 DIAGNOSIS — G8929 Other chronic pain: Secondary | ICD-10-CM

## 2017-06-23 DIAGNOSIS — M722 Plantar fascial fibromatosis: Secondary | ICD-10-CM

## 2017-06-23 LAB — PAP IG W/ RFLX HPV ASCU

## 2017-06-23 NOTE — Progress Notes (Signed)
Patient came in today to pick up custom made foot orthotics.  The goals were accomplished and the patient reported no dissatisfaction with said orthotics.  Patient was advised of breakin period and how to report any issues. 

## 2017-06-27 NOTE — Patient Instructions (Signed)
1. Encounter for gynecological examination with abnormal finding Gyn exam s/p SupraCervical Hysterectomy.  Pap reflex done.  Breasts wnl.  2. 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  S/P Supracervical Hysterectomy.  No CI to HRT.  Risks/Benefits reviewed thoroughly.  Risks of blood clot/Stroke and Breast Ca discussed.  Benefits on CVS, Bones, Sexual activity, skin and overall enjoyment of life reviewed.  Start on Estradiol patch 0.05 continuous use, changing patch every week.  Will schedule Bone Density.  Vit D supplements/Ca++ in food/Weight Bearing physical activity. - DG Bone Density; Future  3. H/O abdominal supracervical subtotal hysterectomy  Beverly Ramirez, it was a pleasure meeting you today!  I will inform you of your results as soon as available.     Menopause and Hormone Replacement Therapy What is hormone replacement therapy? Hormone replacement therapy (HRT) is the use of artificial (synthetic) hormones to replace hormones that your body stops producing during menopause. Menopause is the normal time of life when menstrual periods stop completely and the ovaries stop producing the female hormones estrogen and progesterone. This lack of hormones can affect your health and cause undesirable symptoms. HRT can relieve some of those symptoms. What are my options for HRT? HRT may consist of the synthetic hormones estrogen and progestin, or it may consist of only estrogen (estrogen-only therapy). You and your health care provider will decide which form of HRT is best for you. If you choose to be on HRT and you have a uterus, estrogen and progestin are usually prescribed. Estrogen-only therapy is used for women who do not have a uterus. Possible options for taking HRT include:  Pills.  Patches.  Gels.  Sprays.  Vaginal cream.  Vaginal rings.  Vaginal inserts.  The amount of hormone(s) that you take and  how long you take the hormone(s) varies depending on your individual health. It is important to:  Begin HRT with the lowest possible dosage.  Stop HRT as soon as your health care provider tells you to stop.  Work with your health care provider so that you feel informed and comfortable with your decisions.  What are the benefits of HRT? HRT can reduce the frequency and severity of menopausal symptoms. Benefits of HRT vary depending on the menopausal symptoms that you have, the severity of your symptoms, and your overall health. HRT may help to improve the following menopausal symptoms:  Hot flashes and night sweats. These are sudden feelings of heat that spread over the face and body. The skin may turn red, like a blush. Night sweats are hot flashes that happen while you are sleeping or trying to sleep.  Bone loss (osteoporosis). The body loses calcium more quickly after menopause, causing the bones to become weaker. This can increase the risk for bone breaks (fractures).  Vaginal dryness. The lining of the vagina can become thin and dry, which can cause pain during sexual intercourse or cause infection, burning, or itching.  Urinary tract infections.  Urinary incontinence. This is a decreased ability to control when you urinate.  Irritability.  Short-term memory problems.  What are the risks of HRT? Risks of HRT vary depending on your individual health and medical history. Risks of HRT also depend on whether you receive both estrogen and progestin or you receive estrogen only.HRT may increase the risk of:  Spotting. This is when a small amount of bloodleaks from the vagina unexpectedly.  Endometrial cancer. This cancer is in  the lining of the uterus (endometrium).  Breast cancer.  Increased density of breast tissue. This can make it harder to find breast cancer on a breast X-ray (mammogram).  Stroke.  Heart attack.  Blood clots.  Gallbladder disease.  Risks of HRT can  increase if you have any of the following conditions:  Endometrial cancer.  Liver disease.  Heart disease.  Breast cancer.  History of blood clots.  History of stroke.  How should I care for myself while I am on HRT?  Take over-the-counter and prescription medicines only as told by your health care provider.  Get mammograms, pelvic exams, and medical checkups as often as told by your health care provider.  Have Pap tests done as often as told by your health care provider. A Pap test is sometimes called a Pap smear. It is a screening test that is used to check for signs of cancer of the cervix and vagina. A Pap test can also identify the presence of infection or precancerous changes. Pap tests may be done: ? Every 3 years, starting at age 61. ? Every 5 years, starting after age 20, in combination with testing for human papillomavirus (HPV). ? More often or less often depending on other medical conditions you have, your age, and other risk factors.  It is your responsibility to get your Pap test results. Ask your health care provider or the department performing the test when your results will be ready.  Keep all follow-up visits as told by your health care provider. This is important. When should I seek medical care? Talk with your health care provider if:  You have any of these: ? Pain or swelling in your legs. ? Shortness of breath. ? Chest pain. ? Lumps or changes in your breasts or armpits. ? Slurred speech. ? Pain, burning, or bleeding when you urine.  You develop any of these: ? Unusual vaginal bleeding. ? Dizziness or headaches. ? Weakness or numbness in any part of your arms or legs. ? Pain in your abdomen.  This information is not intended to replace advice given to you by your health care provider. Make sure you discuss any questions you have with your health care provider. Document Released: 05/24/2003 Document Revised: 07/22/2016 Document Reviewed:  02/26/2015 Elsevier Interactive Patient Education  2017 [employer].

## 2017-07-13 ENCOUNTER — Ambulatory Visit (INDEPENDENT_AMBULATORY_CARE_PROVIDER_SITE_OTHER): Payer: Medicare Other | Admitting: Internal Medicine

## 2017-07-13 ENCOUNTER — Encounter: Payer: Self-pay | Admitting: Internal Medicine

## 2017-07-13 ENCOUNTER — Telehealth: Payer: Self-pay | Admitting: Internal Medicine

## 2017-07-13 VITALS — BP 148/94 | HR 60 | Ht 62.0 in | Wt 165.2 lb

## 2017-07-13 DIAGNOSIS — K51919 Ulcerative colitis, unspecified with unspecified complications: Secondary | ICD-10-CM | POA: Insufficient documentation

## 2017-07-13 DIAGNOSIS — Z23 Encounter for immunization: Secondary | ICD-10-CM | POA: Diagnosis not present

## 2017-07-13 DIAGNOSIS — K5190 Ulcerative colitis, unspecified, without complications: Secondary | ICD-10-CM

## 2017-07-13 NOTE — Progress Notes (Addendum)
Lourine Dowty-Hines 56 y.o. [birth year] [id number]  Assessment & Plan:   Ulcerative colitis without complications ([place]) Establishing care here today.  It sounds like there is a past history of Remicade and Humira.  Insurance coverage issues have caused her to go on oral therapy which she is unaware of the name but will call us with that.  I will review her records we have requested the last 5 years her records from her [place] gastroenterologist and will make her a follow-up appointment next available which is 2-3 months, and then before that determine what best to treat her with after records review.  Influenza vaccine provided today.  At her request.  09/02/2017  Records reviewed - was not on Remicade but was on Entyvio  Here is summary  Dx 2007 [place] - hospitalized and then Tx ASA 6 MP x years Dr. Aniceto Boss off meds Readmitted 2016 - Tx IV steroids Then Entyvio - did well with UC but had some nausea, increased LFT's Colonoscopy mild rectal edema w/ micro colitis in ascending only Neg EGD Changed to Humira 2017 - rapid response - did well but moved to [place] and insurance change and could not afford Humira CRP and ESR do rise w/ flares  Will contact her about treatment options which will be in large part determined by insurance  Gatha Mayer, MD, [facility]   Subjective:   Chief Complaint: Ulcerative colitis, establish care  HPI The patient is a pleasant married 56 year old African-American woman who moved from [place] to [place] within the past several months, requesting to establish care for ulcerative colitis.  Previously treated by Dr. Watt Climes in [place], last colonoscopy about 2 years ago, she is in symptomatic remission, with a history of treatment with Remicade and then Humira and prednisone.  She does not remember the name of the therapy he prescribed in June, she saw him and traveled back there to see him, but she could not afford to pay Humira anymore as it  was not covered on her insurance plan.  She has traditional Medicare but does not have a Medicare supplement at this time nor does she have a Medicare part D plan.  She denies any diarrhea bleeding or problems at this time.  She says she was diagnosed 6 or 7 years ago was hospitalized with a lot of nausea and vomiting weight loss diarrhea and terrible abdominal pain.  Her GI review of systems is otherwise negative at this time.  She does report that when she flares she will get mouth ulcers and also have peeling skin on her hands.  May have had some joint issues. Allergies  Allergen Reactions  . Codeine Itching, Rash and Hives  . Latex Hives    With the power   Current Meds  Medication Sig  . hydrOXYzine (ATARAX/VISTARIL) 10 MG tablet Take 10 mg as needed by mouth.  Unknown ulcerative colitis medicine, 4 red pills Past Medical History:  Diagnosis Date  . Anemia   . Carpal tunnel syndrome    bilateral  . Chronic heel pain   . DM (diabetes mellitus) ([place])   . Plantar fasciitis    bilateral  . UC (ulcerative colitis) ([place])    Past Surgical History:  Procedure Laterality Date  . ABDOMINAL HYSTERECTOMY    . APPENDECTOMY    . CESAREAN SECTION     x4  . COLONOSCOPY     Multiple in [place]   Social History   Socioeconomic History  .  Marital status: Married    Spouse name: Not on file  . Number of children: 4  .    .    Social Needs  .    .    .    .    .    Occupational History  . Occupation: retired    Comment: He formerly worked for [employer] of [employer], disabled due to a fall  Tobacco Use  . Smoking status: Former Smoker    Last attempt to quit: 10/13/2006    Years since quitting: 10.7  . Smokeless tobacco: Never Used  Substance and Sexual Activity  . Alcohol use: Yes    Comment: occasional  . Drug use: No  . Sexual activity: Yes    Partners: Male    Comment: 1ST intercourse- 38, partners- 65, married- 39 yrs   Other Topics Concern  . Not on file    Social History Narrative   She is married, 3 sons born 1979, 1981, 1984.  One daughter born in 66.   She is medically retired from the [employer] after a fall and a hip injury, around 2004.   Caffeinated beverage every other day x1   07/13/2017   family history includes Breast cancer in her maternal aunt; Colon cancer (age of onset: 85) in her cousin; Diabetes in her maternal aunt and mother; Hypertension in her mother; Lung cancer in her maternal aunt, maternal aunt, maternal aunt, maternal grandmother, and mother; Other in her sister; Sickle cell anemia in her sister; Sickle cell anemia (age of onset: 76) in her sister; Sickle cell trait in her father and mother; Stroke in her mother.   Review of Systems As per HPI.  Also sinus problems itchy skin.  She says her hand skin peels when she is ill and she will get mouth sores.  All other review of systems are negative.  Objective:   Physical Exam @BP  (!) 148/94 (BP Location: Left Arm, Patient Position: Sitting, Cuff Size: Normal)   Pulse 60   Ht 5' 2"  (1.575 m) Comment: height measured without shoes  Wt 165 lb 4 oz (75 kg)   BMI 30.22 kg/m @  General:  Well-developed, well-nourished and in no acute distress Eyes:  anicteric. ENT:   Mouth and posterior pharynx free of lesions.  Neck:   supple w/o thyromegaly or mass.  Lungs: Clear to auscultation bilaterally. Heart:  S1S2, no rubs, murmurs, gallops. Abdomen:  soft, non-tender, no hepatosplenomegaly, hernia, or mass and BS+.  There are well-healed surgical scars Lymph:  no cervical or supraclavicular adenopathy. Extremities:   no edema, cyanosis or clubbing Skin   no rash. Neuro:  A&O x 3.  Psych:  appropriate mood and  Affect.   Data Reviewed: Nothing to review today awaiting outside records review

## 2017-07-13 NOTE — Telephone Encounter (Signed)
Left message for patient to call me back , I want to confirm her medicine.

## 2017-07-13 NOTE — Assessment & Plan Note (Signed)
Establishing care here today.  It sounds like there is a past history of Remicade and Humira.  Insurance coverage issues have caused her to go on oral therapy which she is unaware of the name but will call us with that.  I will review her records we have requested the last 5 years her records from her [place] gastroenterologist and will make her a follow-up appointment next available which is 2-3 months, and then before that determine what best to treat her with after records review.  Influenza vaccine provided today.  At her request.

## 2017-07-13 NOTE — Patient Instructions (Addendum)
  Call us with the name of the medicine your on please.   We are going to obtain your records from [place] for review.   We will see you back on 09/22/17 at 10:45AM.  Today you've been given a flu shot.   Work on getting a Health visitor.    I appreciate the opportunity to care for you. Silvano Rusk, MD, [facility]

## 2017-07-14 ENCOUNTER — Ambulatory Visit
Admission: RE | Admit: 2017-07-14 | Discharge: 2017-07-14 | Disposition: A | Discharge status: Home / Self Care | Payer: Medicare Other | Source: Ambulatory Visit | Attending: Obstetrics & Gynecology | Admitting: Obstetrics & Gynecology

## 2017-07-14 DIAGNOSIS — Z780 Asymptomatic menopausal state: Secondary | ICD-10-CM | POA: Diagnosis not present

## 2017-07-14 DIAGNOSIS — Z13820 Encounter for screening for osteoporosis: Secondary | ICD-10-CM | POA: Diagnosis not present

## 2017-07-14 DIAGNOSIS — E2839 Other primary ovarian failure: Secondary | ICD-10-CM

## 2017-07-14 DIAGNOSIS — Z1231 Encounter for screening mammogram for malignant neoplasm of breast: Secondary | ICD-10-CM | POA: Diagnosis not present

## 2017-07-14 IMAGING — MG 2D DIGITAL SCREENING BILATERAL MAMMOGRAM WITH CAD AND ADJUNCT TO
8 of 12 series · 8 of 28 positions shown · non-contrast
Comparison: None.

CLINICAL DATA: Screening.

EXAM:
2D DIGITAL SCREENING BILATERAL MAMMOGRAM WITH CAD AND ADJUNCT TOMO

[L CC synth-2D]
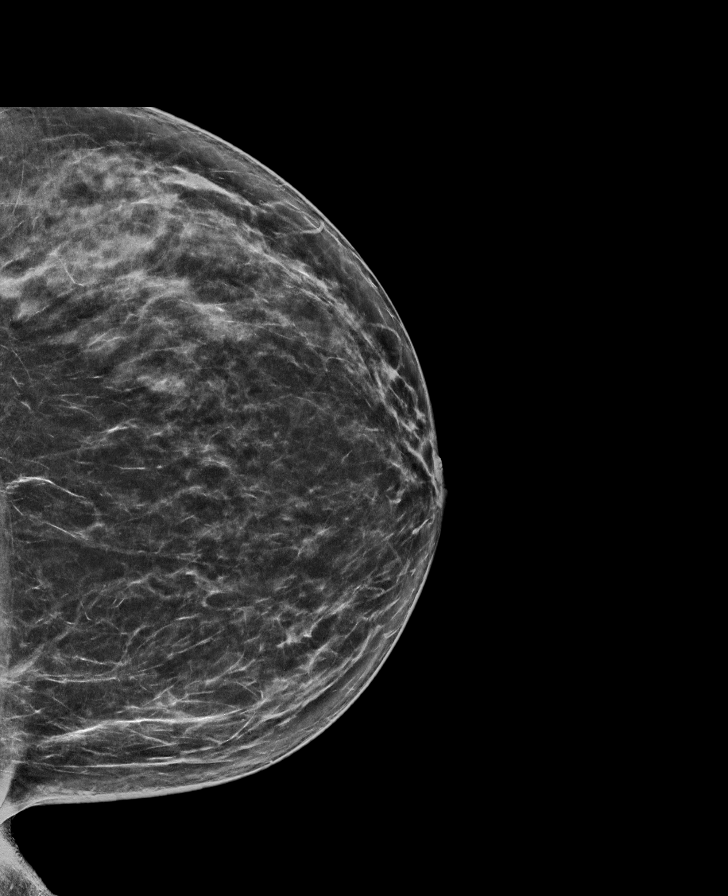

[R MLO synth-2D]
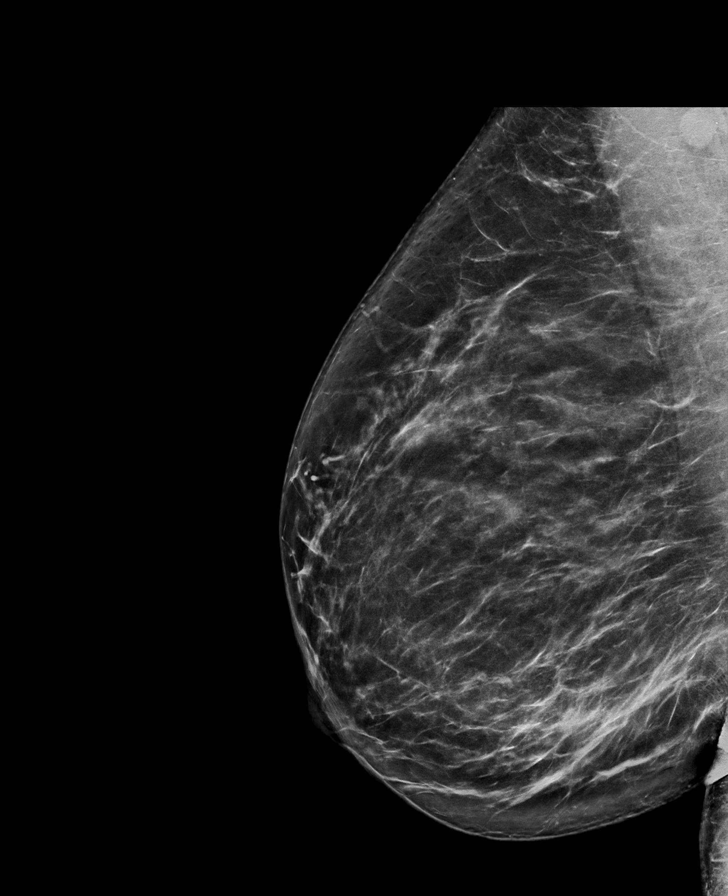

[L MLO synth-2D]
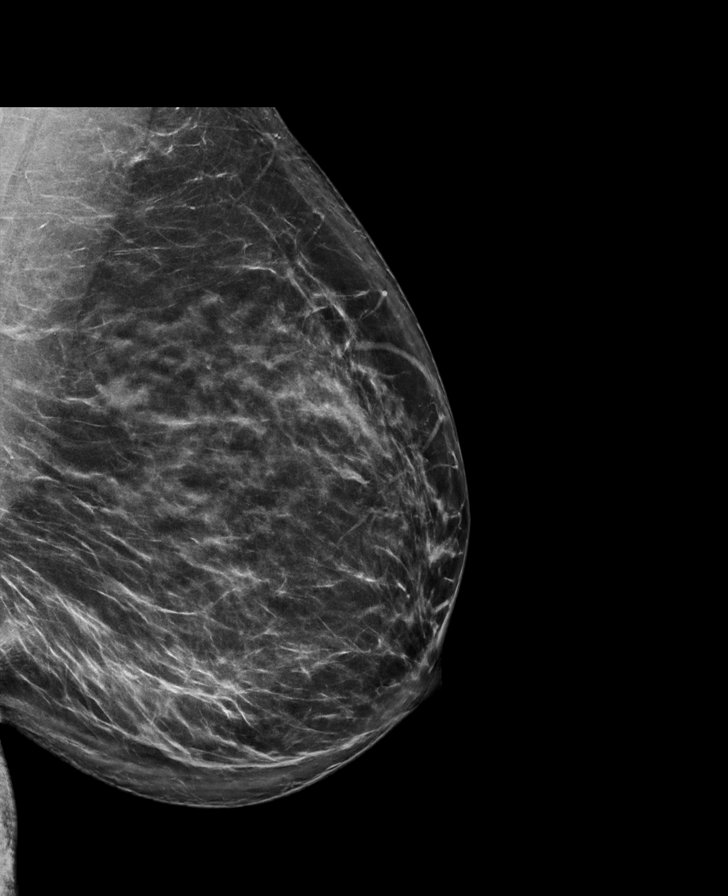

[L CC]
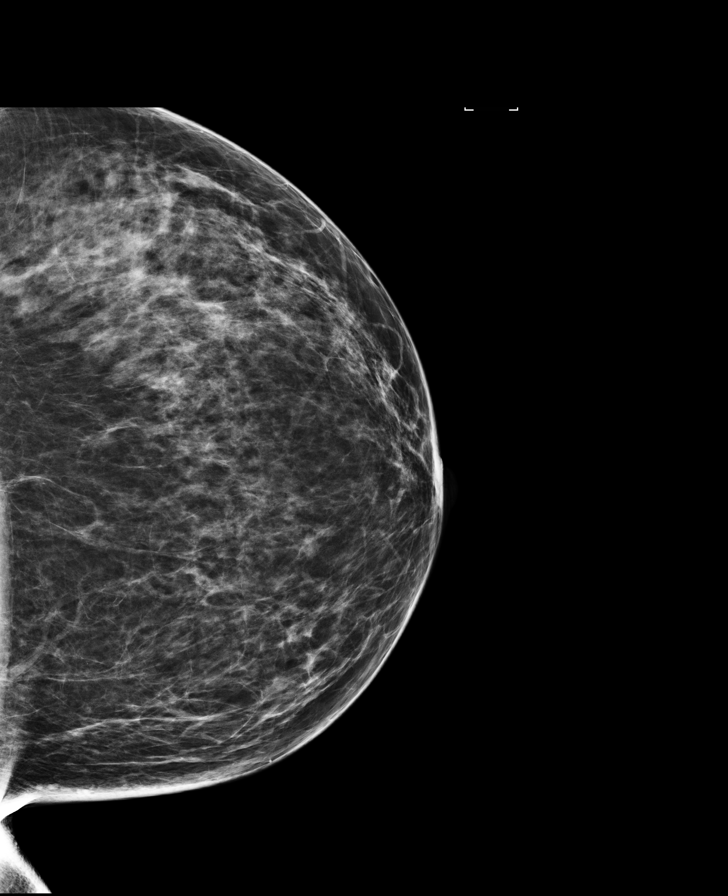

[L MLO]
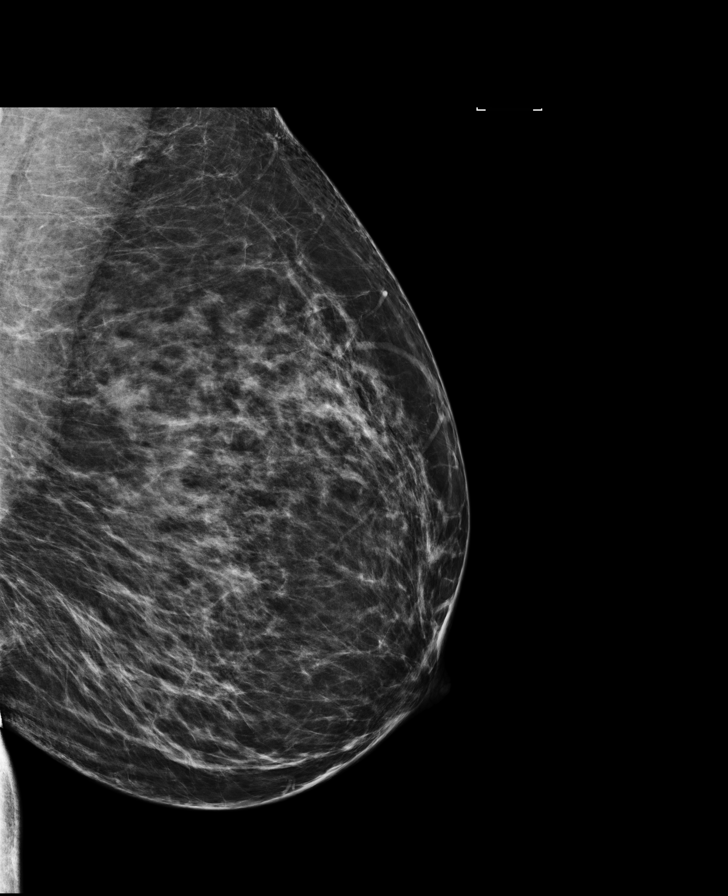

[R CC synth-2D]
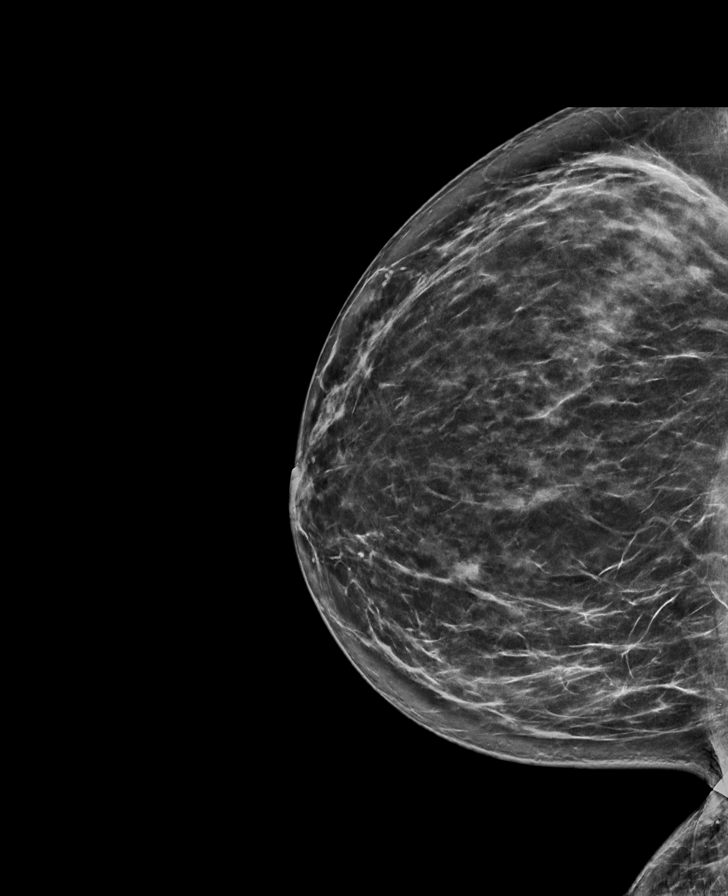

[R CC]
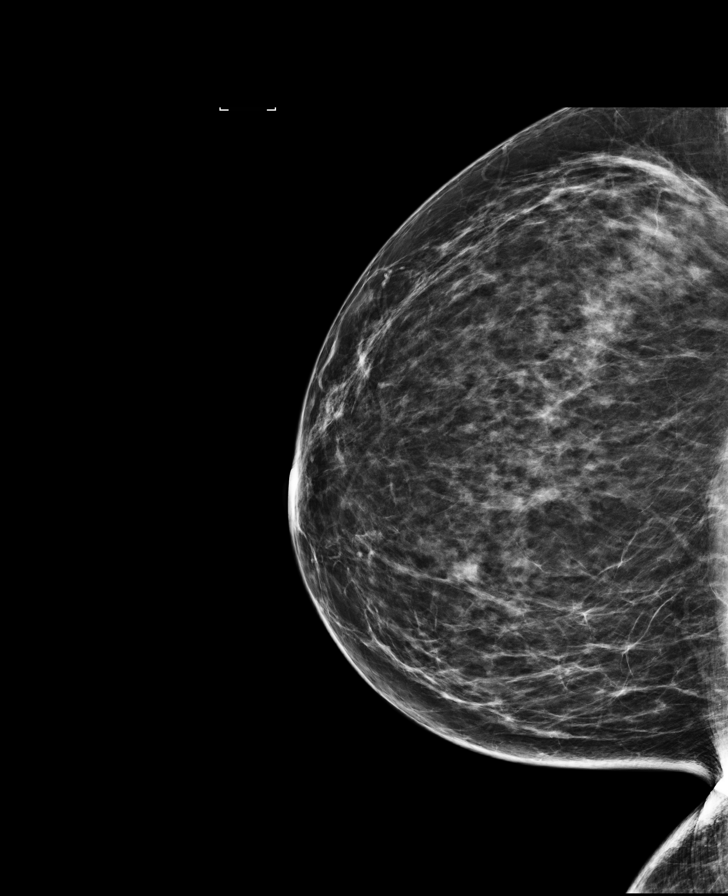

[R MLO]
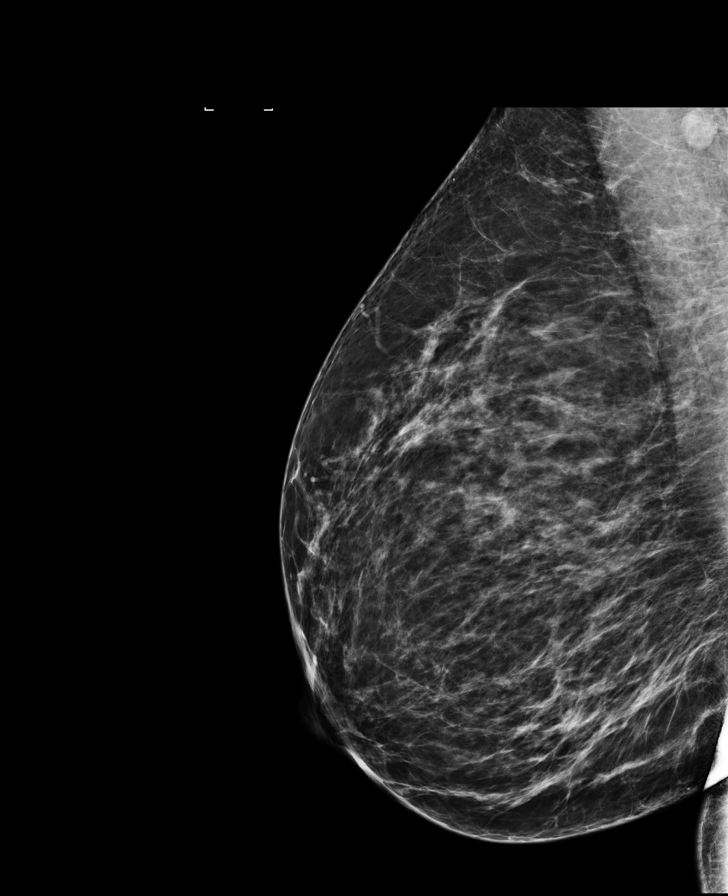

[8 of 28 positions shown; findings below may reference images not displayed]

ACR Breast Density Category b: There are scattered areas of
fibroglandular density.
FINDINGS: There are no findings suspicious for malignancy. Images were
processed with CAD.
IMPRESSION: No mammographic evidence of malignancy. A result letter of this
screening mammogram will be mailed directly to the patient.

RECOMMENDATION:
Screening mammogram in one year. (Code:[19])

BI-RADS CATEGORY  1: Negative.

## 2017-07-14 NOTE — Telephone Encounter (Signed)
Left another message to call me back to confirm rx.

## 2017-07-15 NOTE — Telephone Encounter (Signed)
Patient called back and I confirmed she is on Lialda 1.2g, 4 tablets daily.  I will let Dr Carlean Purl know.

## 2017-08-04 ENCOUNTER — Ambulatory Visit (INDEPENDENT_AMBULATORY_CARE_PROVIDER_SITE_OTHER): Payer: Medicare Other

## 2017-08-04 ENCOUNTER — Encounter: Payer: Self-pay | Admitting: Family Medicine

## 2017-08-04 ENCOUNTER — Other Ambulatory Visit: Payer: Self-pay

## 2017-08-04 ENCOUNTER — Ambulatory Visit (INDEPENDENT_AMBULATORY_CARE_PROVIDER_SITE_OTHER): Payer: Medicare Other | Admitting: Family Medicine

## 2017-08-04 VITALS — BP 128/78 | HR 111 | Temp 98.2°F | Wt 166.4 lb

## 2017-08-04 DIAGNOSIS — Z23 Encounter for immunization: Secondary | ICD-10-CM

## 2017-08-04 DIAGNOSIS — K5190 Ulcerative colitis, unspecified, without complications: Secondary | ICD-10-CM

## 2017-08-04 DIAGNOSIS — R059 Cough, unspecified: Secondary | ICD-10-CM

## 2017-08-04 DIAGNOSIS — R05 Cough: Secondary | ICD-10-CM

## 2017-08-04 DIAGNOSIS — D500 Iron deficiency anemia secondary to blood loss (chronic): Secondary | ICD-10-CM | POA: Diagnosis not present

## 2017-08-04 DIAGNOSIS — L309 Dermatitis, unspecified: Secondary | ICD-10-CM

## 2017-08-04 DIAGNOSIS — Z7689 Persons encountering health services in other specified circumstances: Secondary | ICD-10-CM

## 2017-08-04 DIAGNOSIS — E785 Hyperlipidemia, unspecified: Secondary | ICD-10-CM | POA: Insufficient documentation

## 2017-08-04 LAB — POCT URINALYSIS DIP (MANUAL ENTRY)
Bilirubin, UA: NEGATIVE
Glucose, UA: NEGATIVE mg/dL
Ketones, POC UA: NEGATIVE mg/dL
Leukocytes, UA: NEGATIVE
Nitrite, UA: NEGATIVE
Spec Grav, UA: 1.015 (ref 1.010–1.025)
Urobilinogen, UA: 1 E.U./dL
pH, UA: 6 (ref 5.0–8.0)

## 2017-08-04 IMAGING — DX DG CHEST 2V
2 series · 2 of 2 positions shown · non-contrast
Comparison: None.

CLINICAL DATA: Cough.

EXAM:
CHEST  2 VIEW

[chest pa]
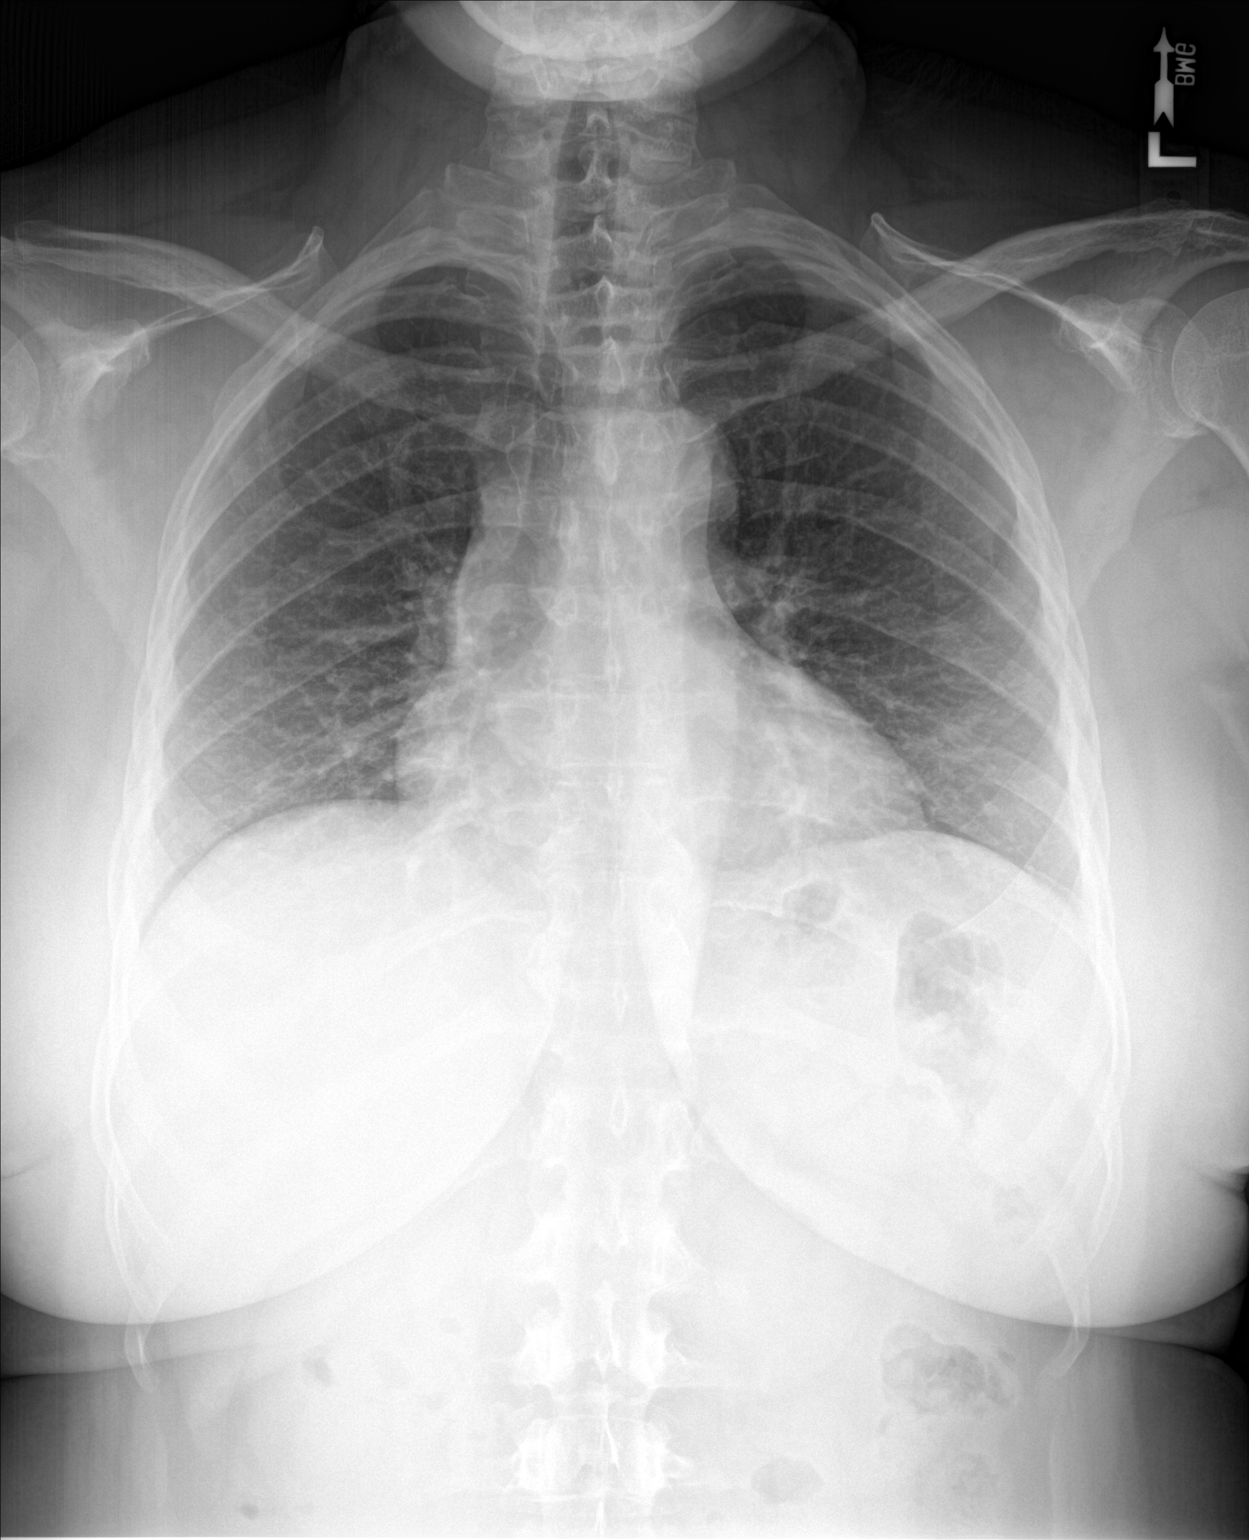

[chest lat]
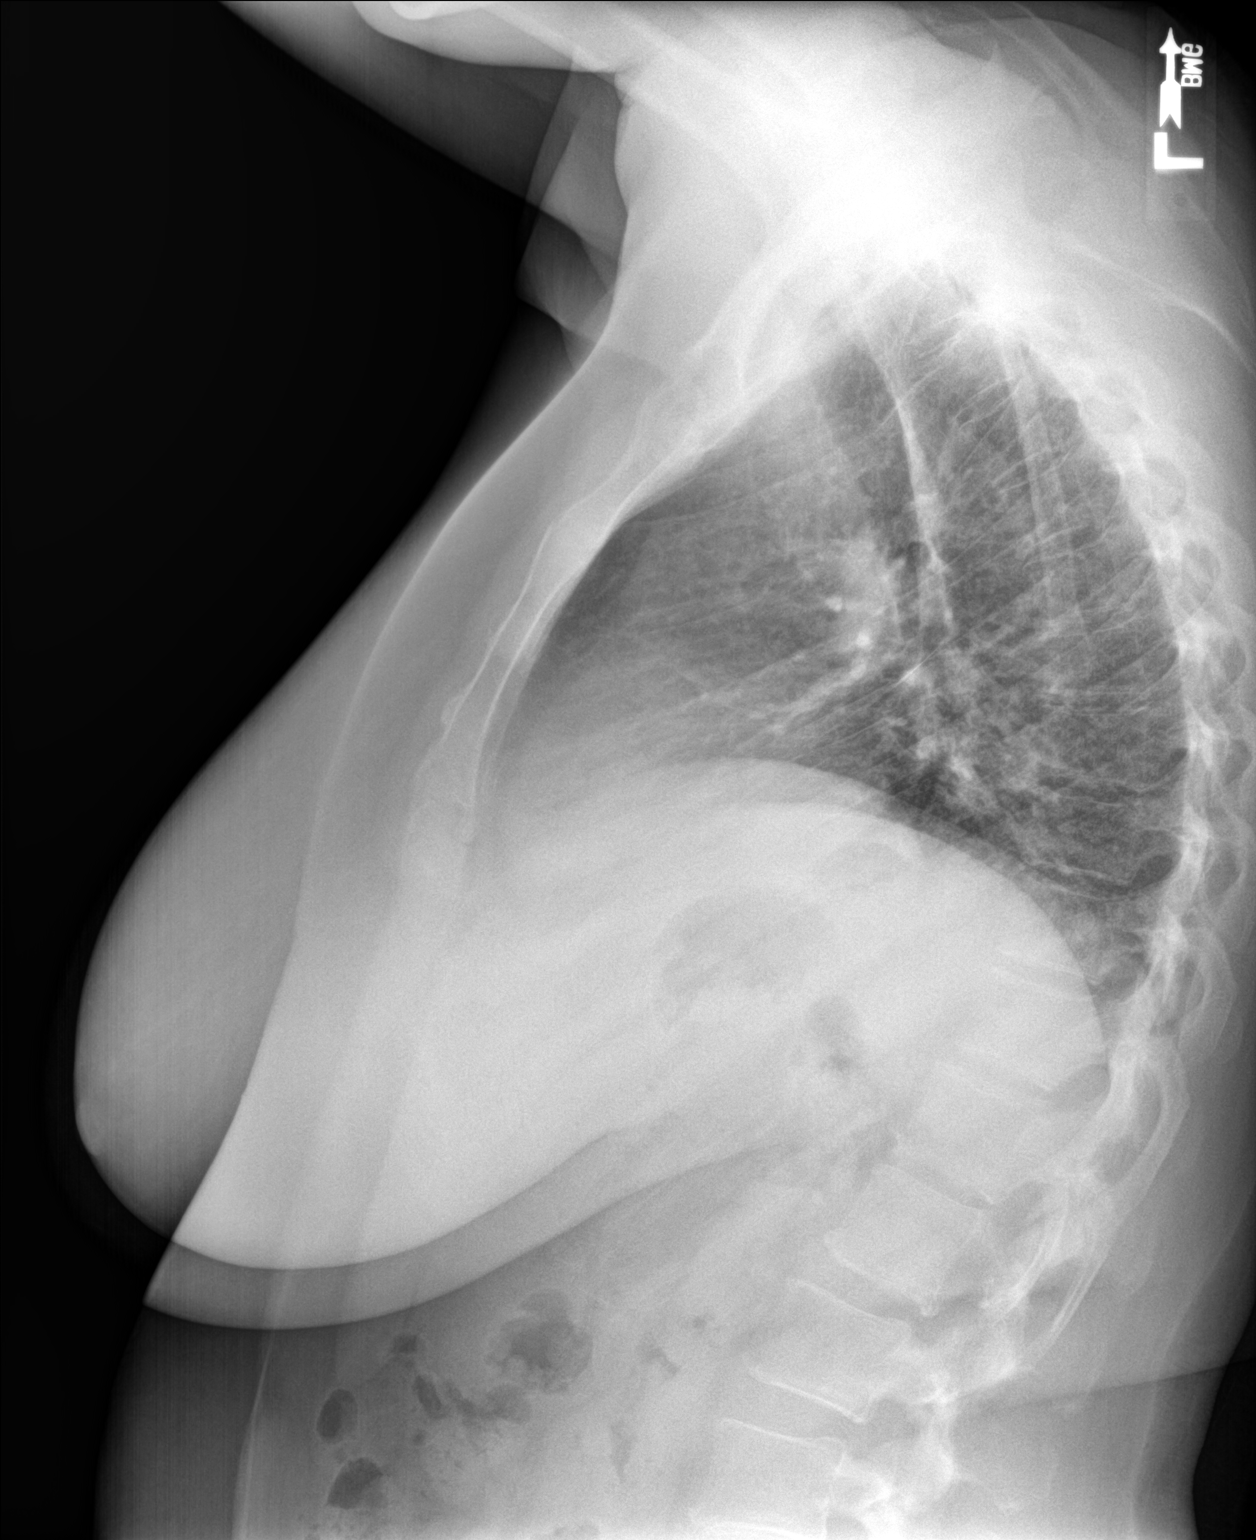

[2 of 2 positions shown; findings below may reference images not displayed]

FINDINGS: The cardiac silhouette, mediastinal and hilar contours are within
normal limits. Mild tortuosity of the thoracic aorta. Slightly low
lung volumes with vascular crowding and streaky atelectasis but no
definite infiltrates or effusions. Mild peribronchial thickening
could suggest bronchitis.

The bony thorax is intact.
IMPRESSION: Slightly low lung volumes with vascular crowding and streaky
atelectasis.

Mild peribronchial thickening could suggest bronchitis. No focal
infiltrates or effusions.

## 2017-08-04 MED ORDER — TRIAMCINOLONE ACETONIDE 0.1 % EX CREA
1.0000 | TOPICAL_CREAM | Freq: Two times a day (BID) | CUTANEOUS | 0 refills | Status: DC
Start: 2017-08-04 — End: 2017-11-17

## 2017-08-04 NOTE — Progress Notes (Signed)
11/27/20182:56 PM  Beverly Ramirez Mar 01, 1961, 56 y.o. female [id number]  Chief Complaint  Patient presents with  . Establish Care    may need gluscose chk due to prednisone taken prn. Makes glucose level spike     HPI:   Patient is a 56 y.o. female with past medical history significant for ulcerative colitis, sickle cell trait and anemia who presents today to establish care.  Patient recently moved to [facility] from [place] due to better weather with her husband and youngest daughter. She is on social security disability from work related injury affecting her hip and back She has established with GI. UC is currently not symptomatic. Used to be on remicade and humira, but now on mesasalamine due to insurance coverage. She is G4P4, postmenopausal, s/p partial hysterectomy due to DUB. Was prescribed climara for hot flashes but she never started it due to concern very strong fhx of breast cancer. Gyn ordered mammo and dexa, both were normal. Anemia has resolved, due to recurring UC flareups and DUB. Patient is jehova's witness and refuses transfusion, but she also states that her hgb was never low enough to be of concern. She reports her only concern is a cough, feels she might be coming down with something and was told that they saw a possible lung nodule, left lobe, in her last mammogram in [place].  Depression screen PHQ 2/9 08/04/2017  Decreased Interest 0  Down, Depressed, Hopeless 0  PHQ - 2 Score 0    Allergies  Allergen Reactions  . Codeine Itching, Rash and Hives  . Latex Hives    With the power    Prior to Admission medications   Medication Sig Start Date End Date Taking? Authorizing Provider  hydrOXYzine (ATARAX/VISTARIL) 10 MG tablet Take 10 mg as needed by mouth.   Yes [provider]  mesalamine (LIALDA) 1.2 g EC tablet Take 4.8 g daily with breakfast by mouth.    [provider]    Past Medical History:  Diagnosis Date  . Anemia   . Carpal tunnel  syndrome    bilateral  . Chronic heel pain   . Plantar fasciitis    bilateral  . UC (ulcerative colitis) ([place])     Past Surgical History:  Procedure Laterality Date  . ABDOMINAL HYSTERECTOMY    . APPENDECTOMY    . CESAREAN SECTION     x4  . COLONOSCOPY     Multiple in [place]    Social History   Tobacco Use  . Smoking status: Former Smoker    Last attempt to quit: 10/13/2006    Years since quitting: 10.8  . Smokeless tobacco: Never Used  Substance Use Topics  . Alcohol use: Yes    Comment: occasional    Family History  Problem Relation Age of Onset  . Stroke Mother   . Diabetes Mother   . Hypertension Mother   . Lung cancer Mother   . Sickle cell trait Mother   . Breast cancer Maternal Aunt   . Diabetes Maternal Aunt   . Lung cancer Maternal Aunt   . Lung cancer Maternal Aunt   . Lung cancer Maternal Aunt   . Colon cancer Cousin 71  . Sickle cell trait Father   . Sickle cell anemia Sister 53  . Lung cancer Maternal Grandmother   . Sickle cell anemia Sister   . Other Sister        accident and blood clot formed    Review of Systems  Constitutional: Negative for chills, fever, malaise/fatigue and weight loss.  HENT: Positive for congestion. Negative for hearing loss, sore throat and tinnitus.   Eyes: Negative for blurred vision and double vision.  Respiratory: Positive for cough. Negative for hemoptysis, sputum production, shortness of breath and wheezing.   Cardiovascular: Negative for chest pain, palpitations and leg swelling.  Gastrointestinal: Negative for abdominal pain, blood in stool, constipation, diarrhea, melena, nausea and vomiting.  Genitourinary: Negative for dysuria and hematuria.  Musculoskeletal: Negative for joint pain and myalgias.  Skin: Positive for itching and rash.  Neurological: Negative for dizziness, weakness and headaches.  Psychiatric/Behavioral: Negative for depression. The patient is not nervous/anxious.       OBJECTIVE:  Blood pressure 128/78, pulse (!) 111, temperature 98.2 F (36.8 C), temperature source Oral, weight 166 lb 6.4 oz (75.5 kg), SpO2 97 %.  Physical Exam  Constitutional: She is oriented to person, place, and time and well-developed, well-nourished, and in no distress.  HENT:  Head: Normocephalic and atraumatic.  Right Ear: Hearing, tympanic membrane, external ear and ear canal normal.  Left Ear: Hearing, tympanic membrane, external ear and ear canal normal.  Mouth/Throat: Oropharynx is clear and moist.  Eyes: EOM are normal. Pupils are equal, round, and reactive to light.  Neck: Neck supple. No thyromegaly present.  Cardiovascular: Normal rate, regular rhythm, normal heart sounds and intact distal pulses. Exam reveals no gallop and no friction rub.  No murmur heard. Pulmonary/Chest: Effort normal and breath sounds normal. She has no wheezes. She has no rales.  Abdominal: Soft. Bowel sounds are normal. She exhibits no distension and no mass. There is no tenderness.  Musculoskeletal: Normal range of motion. She exhibits no edema.  Lymphadenopathy:    She has no cervical adenopathy.  Neurological: She is alert and oriented to person, place, and time. She has normal reflexes. Gait normal.  Skin: Skin is warm and dry. Rash (extensor surface of upper arms with macular scaly patches. ) noted.  Psychiatric: Mood and affect normal.  Nursing note and vitals reviewed.   Results for orders placed or performed in visit on 08/04/17 (from the past 24 hour(s))  POCT urinalysis dipstick     Status: Abnormal   Collection Time: 08/04/17  2:32 PM  Result Value Ref Range   Color, UA yellow yellow   Clarity, UA clear clear   Glucose, UA negative negative mg/dL   Bilirubin, UA negative negative   Ketones, POC UA negative negative mg/dL   Spec Grav, UA 1.015 1.010 - 1.025   Blood, UA small (A) negative   pH, UA 6.0 5.0 - 8.0   Protein Ur, POC trace (A) negative mg/dL   Urobilinogen,  UA 1.0 0.2 or 1.0 E.U./dL   Nitrite, UA Negative Negative   Leukocytes, UA Negative Negative    Dg Chest 2 View  Result Date: 08/04/2017 CLINICAL DATA:  Cough. EXAM: CHEST  2 VIEW COMPARISON:  None. FINDINGS: The cardiac silhouette, mediastinal and hilar contours are within normal limits. Mild tortuosity of the thoracic aorta. Slightly low lung volumes with vascular crowding and streaky atelectasis but no definite infiltrates or effusions. Mild peribronchial thickening could suggest bronchitis. The bony thorax is intact. IMPRESSION: Slightly low lung volumes with vascular crowding and streaky atelectasis. Mild peribronchial thickening could suggest bronchitis. No focal infiltrates or effusions. Electronically Signed   By: Marijo Sanes M.D.   On: 08/04/2017 14:54     ASSESSMENT and PLAN 1. Encounter to establish care PMH, PSH, meds, allergies, Fhx, Shx,  reviewed with patient today. - CBC - Comprehensive metabolic panel - Lipid panel - TSH - POCT urinalysis dipstick  2. Cough - DG Chest 2 View; Future, suggestive of possible bronchitis, most likely viral as no fevers or significant symptoms and normal exam Discussed supportive measures: increase hydration, rest, OTC medications, etc. RTC precautions discussed.  3. Eczema, unspecified type Discussed routine eczema skin care instructions - triamcinolone cream (KENALOG) 0.1 %; Apply 1 application topically 2 (two) times daily.   4. Ulcerative colitis without complications, unspecified location [facility]) Currently well controlled on current regime, patient has established with GI, follow-up as scheduled. - CBC - Comprehensive metabolic panel  5. Need for diphtheria-tetanus-pertussis (Tdap) vaccine - Tdap vaccine greater than or equal to 7yo IM - given today  6. Anemia due to chronic blood loss Checking CBC today, it seems to be resolved with improved control and UC and hysterectomy. - CBC  7. Hyperlipidemia, unspecified  hyperlipidemia type Patient reports used to take medication but stopped for no specific reasons, will recheck lipids today. - Comprehensive metabolic panel - Lipid panel - TSH   Return in about 3 months (around 11/04/2017) for after seeing GI.    Rutherford Guys, MD [facility], [place] [address] Ph.  [phone] Fax [phone]

## 2017-08-04 NOTE — Patient Instructions (Signed)
     IF you received an x-ray today, you will receive an invoice from [facility]. Please contact [facility] at [phone] with questions or concerns regarding your invoice.   IF you received labwork today, you will receive an invoice from [facility]. Please contact [facility] at [phone] with questions or concerns regarding your invoice.   Our billing staff will not be able to assist you with questions regarding bills from these companies.  You will be contacted with the lab results as soon as they are available. The fastest way to get your results is to activate your My Chart account. Instructions are located on the last page of this paperwork. If you have not heard from us regarding the results in 2 weeks, please contact this office.     

## 2017-08-05 LAB — TSH: TSH: 0.725 u[IU]/mL (ref 0.450–4.500)

## 2017-08-05 LAB — CBC
Hematocrit: 38.5 % (ref 34.0–46.6)
Hemoglobin: 13.3 g/dL (ref 11.1–15.9)
MCH: 28.6 pg (ref 26.6–33.0)
MCHC: 34.5 g/dL (ref 31.5–35.7)
MCV: 83 fL (ref 79–97)
Platelets: 330 10*3/uL (ref 150–379)
RBC: 4.65 x10E6/uL (ref 3.77–5.28)
RDW: 13.6 % (ref 12.3–15.4)
WBC: 13.2 10*3/uL — ABNORMAL HIGH (ref 3.4–10.8)

## 2017-08-05 LAB — COMPREHENSIVE METABOLIC PANEL
ALT: 37 IU/L — ABNORMAL HIGH (ref 0–32)
AST: 22 IU/L (ref 0–40)
Albumin/Globulin Ratio: 1.4 (ref 1.2–2.2)
Albumin: 4.2 g/dL (ref 3.5–5.5)
Alkaline Phosphatase: 102 IU/L (ref 39–117)
BUN/Creatinine Ratio: 13 (ref 9–23)
BUN: 12 mg/dL (ref 6–24)
Bilirubin Total: 0.3 mg/dL (ref 0.0–1.2)
CO2: 26 mmol/L (ref 20–29)
Calcium: 9.7 mg/dL (ref 8.7–10.2)
Chloride: 102 mmol/L (ref 96–106)
Creatinine, Ser: 0.89 mg/dL (ref 0.57–1.00)
GFR calc Af Amer: 84 mL/min/{1.73_m2} (ref >59)
GFR calc non Af Amer: 73 mL/min/{1.73_m2} (ref >59)
Globulin, Total: 2.9 g/dL (ref 1.5–4.5)
Glucose: 109 mg/dL — ABNORMAL HIGH (ref 65–99)
Potassium: 4 mmol/L (ref 3.5–5.2)
Sodium: 143 mmol/L (ref 134–144)
Total Protein: 7.1 g/dL (ref 6.0–8.5)

## 2017-08-05 LAB — LIPID PANEL
Chol/HDL Ratio: 6.5 ratio — ABNORMAL HIGH (ref 0.0–4.4)
Cholesterol, Total: 268 mg/dL — ABNORMAL HIGH (ref 100–199)
HDL: 41 mg/dL (ref >39)
LDL Calculated: 185 mg/dL — ABNORMAL HIGH (ref 0–99)
Triglycerides: 210 mg/dL — ABNORMAL HIGH (ref 0–149)
VLDL Cholesterol Cal: 42 mg/dL — ABNORMAL HIGH (ref 5–40)

## 2017-08-06 NOTE — Telephone Encounter (Signed)
I am still waiting on outside records.  Can you double check on these thanks

## 2017-08-06 NOTE — Telephone Encounter (Signed)
Have re-faxed the ROI.

## 2017-08-07 NOTE — Telephone Encounter (Signed)
Spoke to the office where the records are coming from and she gave me a different fax # so re-faxed it again.

## 2017-08-10 ENCOUNTER — Encounter: Payer: Self-pay | Admitting: Family Medicine

## 2017-08-13 NOTE — Telephone Encounter (Signed)
Spoke with the office again and they cannot find the ROI, per JoHana refaxed to her attention.

## 2017-08-24 ENCOUNTER — Telehealth: Payer: Self-pay | Admitting: *Deleted

## 2017-08-24 DIAGNOSIS — D72829 Elevated white blood cell count, unspecified: Secondary | ICD-10-CM

## 2017-08-24 NOTE — Telephone Encounter (Signed)
Dr. Pamella Pert, I spoke with the patient and should would like a referral  to a hematologist. She stated that the one she saw in 03/2016 is in [place]. Also, she will take cholesterol medication, it can be called into the [facility] on [address]. [phone]. She also, stated that she had discussed with you, that her urine smells "has a really strong odor", but she has not heard anything back on it. Please advise.

## 2017-08-24 NOTE — Telephone Encounter (Signed)
I have called once again checking on the records and spoke with several people.  They are checking into this matter and they are going to call me back.

## 2017-08-25 MED ORDER — ATORVASTATIN CALCIUM 40 MG PO TABS: 40.0000 mg | ORAL_TABLET | Freq: Every day | ORAL | 1 refills | Status: DC

## 2017-08-25 NOTE — Telephone Encounter (Signed)
Thanks. Please let patient know that her urine did not look infected when I checked her in clinic. Please have her increase fluids. If it persist we might need to send it for culture. thanks

## 2017-08-26 NOTE — Progress Notes (Signed)
Called pt regarding the result note per [place]. Pt cell is no longer in service.

## 2017-08-27 NOTE — Telephone Encounter (Signed)
Spoke with Dr Bobby Rumpf Cohen's office once again and the young lady assured me this matter about patients records will be taken care of.  She said I should get the records in 24-48 hours and if they don't come to call them back.

## 2017-08-27 NOTE — Telephone Encounter (Signed)
Records came and have been placed in Dr Celesta Aver office for review.

## 2017-09-02 ENCOUNTER — Encounter: Payer: Self-pay | Admitting: Internal Medicine

## 2017-09-03 NOTE — Telephone Encounter (Signed)
Left message on her voice mail to call me back.

## 2017-09-03 NOTE — Telephone Encounter (Signed)
Please tell her I reviewed her records and see that she has been treated with Entyvio and then Humira as biologic agents.  Please get a colitis symptom update from her re: diarrhea, bleeding, abdominal pain   Since she required biologics in the past I suspect she will need that type of medication again  That depends upon her Medicare part D - does she have one of thos plans for 2019? If so can she tell us what it is so we can check her formulary?

## 2017-09-07 NOTE — Telephone Encounter (Signed)
Beverly Ramirez said she is doing okay other than having nausea x 4 days and hands breaking out, no fever, no diarrhea, no abdominal pain. She said her hands used to break out before she would have an attack.  She has an appointment to see Dr Carlean Purl 09/22/17.  She is going to find out which biologic her insurance covers and let us know.

## 2017-09-21 ENCOUNTER — Other Ambulatory Visit: Payer: Self-pay

## 2017-09-21 ENCOUNTER — Ambulatory Visit (INDEPENDENT_AMBULATORY_CARE_PROVIDER_SITE_OTHER): Payer: Medicare Other | Admitting: Family Medicine

## 2017-09-21 ENCOUNTER — Ambulatory Visit: Payer: Medicare Other | Admitting: Family Medicine

## 2017-09-21 ENCOUNTER — Encounter: Payer: Self-pay | Admitting: Family Medicine

## 2017-09-21 VITALS — BP 132/84 | HR 98 | Temp 97.8°F | Ht 65.0 in | Wt 168.8 lb

## 2017-09-21 DIAGNOSIS — R079 Chest pain, unspecified: Secondary | ICD-10-CM

## 2017-09-21 DIAGNOSIS — G4452 New daily persistent headache (NDPH): Secondary | ICD-10-CM

## 2017-09-21 DIAGNOSIS — R51 Headache: Secondary | ICD-10-CM

## 2017-09-21 DIAGNOSIS — R519 Headache, unspecified: Secondary | ICD-10-CM

## 2017-09-21 MED ORDER — IBUPROFEN 600 MG PO TABS: 600.0000 mg | ORAL_TABLET | Freq: Three times a day (TID) | ORAL | 2 refills | Status: DC | PRN

## 2017-09-21 MED ORDER — COLCHICINE 0.6 MG PO TABS: 0.6000 mg | ORAL_TABLET | Freq: Two times a day (BID) | ORAL | 3 refills | Status: DC

## 2017-09-21 MED ORDER — OMEPRAZOLE 20 MG PO CPDR: 20.0000 mg | DELAYED_RELEASE_CAPSULE | Freq: Every day | ORAL | 3 refills | Status: DC

## 2017-09-21 NOTE — Progress Notes (Signed)
1/14/201911:51 AM  Beverly Ramirez 02/16/1961, 57 y.o. female [id number]  Chief Complaint  Patient presents with  . Migraine    FOR 7 DAYS. HAVING ORAL SURGERY DONE SOON. TAKING AMOXICILLIN FOR TOOTH    HPI:   Patient is a 57 y.o. female with past medical history significant for UC who presents today for several concerns:  1. Headache, bilateral, throbbing, constant, x 1 week. starts in back of head/neck and radiates forward. She states that she goes to bed with headache, wakes up with headache. She denies any vision changes. She reports mild nausea. She does not suffer from headaches. She denies any trauma. 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   2. Chest pain, diffuse, anterior, with deep breathing only or is she lies back in her recliner. Stabbing. Feels it radiates to the back. Present for about a week. Pain forces her to sit forward in her recliner. Not associated with eating. Denies any recent illnesses, SOB, fever or chills. Has not taken anything for it. Pain is intermittent and self-resolves. She feels she is having a mild UC flareup..   Depression screen [facility] 2/9 09/21/2017 08/04/2017  Decreased Interest 0 0  Down, Depressed, Hopeless 0 0  PHQ - 2 Score 0 0    Allergies  Allergen Reactions  . Codeine Itching, Rash and Hives  . Latex Hives    With the power    Prior to Admission medications   Medication Sig Start Date End Date Taking? Authorizing Provider  atorvastatin (LIPITOR) 40 MG tablet Take 1 tablet (40 mg total) by mouth daily. 08/25/17   Rutherford Guys, MD  hydrOXYzine (ATARAX/VISTARIL) 10 MG tablet Take 10 mg as needed by mouth.    [provider]  mesalamine (LIALDA) 1.2 g EC tablet Take 4.8 g daily with breakfast by mouth.    [provider]  triamcinolone cream (KENALOG) 0.1 % Apply 1 application topically 2 (two) times daily. 08/04/17   Rutherford Guys, MD    Past Medical History:  Diagnosis Date  . Anemia   . Carpal tunnel  syndrome    bilateral  . Chronic heel pain   . DJD (degenerative joint disease) of cervical spine    MRI 2017  . Plantar fasciitis    bilateral  . UC (ulcerative colitis) ([place])     Past Surgical History:  Procedure Laterality Date  . ABDOMINAL HYSTERECTOMY    . APPENDECTOMY    . CESAREAN SECTION     x4  . COLONOSCOPY     Multiple in [place]  . ESOPHAGOGASTRODUODENOSCOPY      Social History   Tobacco Use  . Smoking status: Former Smoker    Last attempt to quit: 10/13/2006    Years since quitting: 10.9  . Smokeless tobacco: Never Used  Substance Use Topics  . Alcohol use: Yes    Comment: occasional    Family History  Problem Relation Age of Onset  . Stroke Mother   . Diabetes Mother   . Hypertension Mother   . Lung cancer Mother   . Sickle cell trait Mother   . Breast cancer Maternal Aunt   . Diabetes Maternal Aunt   . Lung cancer Maternal Aunt   . Lung cancer Maternal Aunt   . Lung cancer Maternal Aunt   . Colon cancer Cousin 52  . Sickle cell trait Father   . Sickle cell anemia Sister 28  . Lung cancer Maternal Grandmother   . Sickle cell anemia Sister   .  Other Sister        accident and blood clot formed    ROS Per hpi  OBJECTIVE:  Blood pressure 132/84, pulse 98, temperature 97.8 F (36.6 C), temperature source Oral, height _0  (1.651 m), weight 168 lb 12.8 oz (76.6 kg), SpO2 99 %.  Physical Exam  Constitutional: She is oriented to person, place, and time and well-developed, well-nourished, and in no distress.  HENT:  Head: Normocephalic and atraumatic.  Mouth/Throat: Oropharynx is clear and moist. No oropharyngeal exudate.  Bilateral TA nontender, nonindurated, + pulses  Eyes: EOM are normal. Pupils are equal, round, and reactive to light. No scleral icterus.  Neck: Neck supple. No JVD present. Muscular tenderness present. No spinous process tenderness present. Normal range of motion present.  Cardiovascular: Normal rate, regular rhythm and  normal heart sounds. Exam reveals no gallop and no friction rub.  No murmur heard. Pulmonary/Chest: Effort normal and breath sounds normal. She has no wheezes. She has no rales. She exhibits no tenderness.  Musculoskeletal: She exhibits no edema.  Lymphadenopathy:    She has no cervical adenopathy.  Neurological: She is alert and oriented to person, place, and time. She has normal sensation, normal strength, normal reflexes and intact cranial nerves. She has a normal Cerebellar Exam. Gait normal.  Skin: Skin is warm and dry.    ASSESSMENT and PLAN  1. Chest pain, unspecified type History suggestive of pericarditis, mild. Can be related to UC or sulfasalazine. Will start treatment with NSAIDs and colchicine. New meds r/se/b discussed. Adding GI protection. ER precautions reviewed.  - EKG 12-Lead - nsr, hr 57, nonspecific t wave abnormalities. No st elevation.   2. Acute intractable headache, unspecified headache type New onset in 57yo F. Exam suggestive of MSK etiology stemming from DDD-neck. However given age, constant and association with nausea will check ESR and CT scan head to r/o structural etiologies.  - CBC with Differential - Comprehensive metabolic panel - Sedimentation Rate  3. New daily persistent headache - CT HEAD W & WO CONTRAST; Future  Other orders - ibuprofen (ADVIL,MOTRIN) 600 MG tablet; Take 1 tablet (600 mg total) by mouth every 8 (eight) hours as needed. - colchicine 0.6 MG tablet; Take 1 tablet (0.6 mg total) by mouth 2 (two) times daily. - omeprazole (PRILOSEC) 20 MG capsule; Take 1 capsule (20 mg total) by mouth daily.  Return in about 1 week (around 09/28/2017).    Rutherford Guys, MD [facility], [place] [address] Ph.  [phone] Fax [phone]

## 2017-09-21 NOTE — Patient Instructions (Signed)
     IF you received an x-ray today, you will receive an invoice from [facility]. Please contact [facility] at [phone] with questions or concerns regarding your invoice.   IF you received labwork today, you will receive an invoice from [facility]. Please contact [facility] at [phone] with questions or concerns regarding your invoice.   Our billing staff will not be able to assist you with questions regarding bills from these companies.  You will be contacted with the lab results as soon as they are available. The fastest way to get your results is to activate your My Chart account. Instructions are located on the last page of this paperwork. If you have not heard from us regarding the results in 2 weeks, please contact this office.        IF you received an x-ray today, you will receive an invoice from [facility]. Please contact [facility] at [phone] with questions or concerns regarding your invoice.   IF you received labwork today, you will receive an invoice from [facility]. Please contact [facility] at [phone] with questions or concerns regarding your invoice.   Our billing staff will not be able to assist you with questions regarding bills from these companies.  You will be contacted with the lab results as soon as they are available. The fastest way to get your results is to activate your My Chart account. Instructions are located on the last page of this paperwork. If you have not heard from us regarding the results in 2 weeks, please contact this office.     

## 2017-09-22 ENCOUNTER — Encounter: Payer: Self-pay | Admitting: Internal Medicine

## 2017-09-22 ENCOUNTER — Ambulatory Visit (INDEPENDENT_AMBULATORY_CARE_PROVIDER_SITE_OTHER): Payer: Medicare Other | Admitting: Internal Medicine

## 2017-09-22 ENCOUNTER — Other Ambulatory Visit (INDEPENDENT_AMBULATORY_CARE_PROVIDER_SITE_OTHER): Payer: Medicare Other

## 2017-09-22 VITALS — BP 124/80 | HR 60 | Ht 62.0 in | Wt 166.5 lb

## 2017-09-22 DIAGNOSIS — Z7960 Long term (current) use of unspecified immunomodulators and immunosuppressants: Secondary | ICD-10-CM

## 2017-09-22 DIAGNOSIS — K51918 Ulcerative colitis, unspecified with other complication: Secondary | ICD-10-CM

## 2017-09-22 DIAGNOSIS — Z79899 Other long term (current) drug therapy: Secondary | ICD-10-CM

## 2017-09-22 LAB — COMPREHENSIVE METABOLIC PANEL
ALT: 23 IU/L (ref 0–32)
AST: 16 IU/L (ref 0–40)
Albumin/Globulin Ratio: 1.4 (ref 1.2–2.2)
Albumin: 4 g/dL (ref 3.5–5.5)
Alkaline Phosphatase: 88 IU/L (ref 39–117)
BUN/Creatinine Ratio: 12 (ref 9–23)
BUN: 9 mg/dL (ref 6–24)
Bilirubin Total: 0.2 mg/dL (ref 0.0–1.2)
CO2: 27 mmol/L (ref 20–29)
Calcium: 9.5 mg/dL (ref 8.7–10.2)
Chloride: 100 mmol/L (ref 96–106)
Creatinine, Ser: 0.75 mg/dL (ref 0.57–1.00)
GFR calc Af Amer: 104 mL/min/{1.73_m2} (ref >59)
GFR calc non Af Amer: 90 mL/min/{1.73_m2} (ref >59)
Globulin, Total: 2.8 g/dL (ref 1.5–4.5)
Glucose: 123 mg/dL — ABNORMAL HIGH (ref 65–99)
Potassium: 3.8 mmol/L (ref 3.5–5.2)
Sodium: 140 mmol/L (ref 134–144)
Total Protein: 6.8 g/dL (ref 6.0–8.5)

## 2017-09-22 LAB — CBC WITH DIFFERENTIAL/PLATELET
Basophils Absolute: 0 10*3/uL (ref 0.0–0.2)
Basos: 0 %
EOS (ABSOLUTE): 1 10*3/uL — ABNORMAL HIGH (ref 0.0–0.4)
Eos: 8 %
Hematocrit: 36.3 % (ref 34.0–46.6)
Hemoglobin: 11.9 g/dL (ref 11.1–15.9)
Immature Grans (Abs): 0 10*3/uL (ref 0.0–0.1)
Immature Granulocytes: 0 %
Lymphocytes Absolute: 4 10*3/uL — ABNORMAL HIGH (ref 0.7–3.1)
Lymphs: 32 %
MCH: 28.3 pg (ref 26.6–33.0)
MCHC: 32.8 g/dL (ref 31.5–35.7)
MCV: 86 fL (ref 79–97)
Monocytes Absolute: 1 10*3/uL — ABNORMAL HIGH (ref 0.1–0.9)
Monocytes: 8 %
Neutrophils Absolute: 6.4 10*3/uL (ref 1.4–7.0)
Neutrophils: 52 %
Platelets: 350 10*3/uL (ref 150–379)
RBC: 4.21 x10E6/uL (ref 3.77–5.28)
RDW: 13.6 % (ref 12.3–15.4)
WBC: 12.4 10*3/uL — ABNORMAL HIGH (ref 3.4–10.8)

## 2017-09-22 LAB — SEDIMENTATION RATE: Sed Rate: 10 mm/hr (ref 0–40)

## 2017-09-22 LAB — C-REACTIVE PROTEIN: CRP: 0.6 mg/dL (ref 0.5–20.0)

## 2017-09-22 MED ORDER — PREDNISONE 10 MG PO TABS: ORAL_TABLET | ORAL | 0 refills | Status: DC

## 2017-09-22 NOTE — Progress Notes (Signed)
Haylo Sitter-Hines 57 y.o. [birth year] [id number]  Assessment & Plan:   Encounter Diagnoses  Name Primary?  . Ulcerative colitis with other complication, unspecified location ([place]) Yes  . Long-term use of immunosuppressant medication - anticipate     I explained to her that it will probably be impossible to get her back on a biologic without some sort of insurance coverage.  I suspect her financial situation is such that she would not qualify for any assistance programs that we can see.  Nevertheless I will check a QuantiFERON again, she had one earlier last year that was okay but will check that, I am going to check a TP MT level in anticipation of starting 6-MP.  We may just go with 6-MP plus or minus Lialda.  That may be the most cost effective route for her.  I reviewed the risks benefits and indications of immunomodulators and biologic's with her some and of also given her the [facility] a handouts for both of these types of medications.  I plan to see her back in 2 months.  Sooner if needed.  Because of her acute symptoms that she says are like a flare she will have a one-month taper of prednisone starting at 40 mg daily declining by 10 mg each week   When she returns we can consider checking up on immunizations, she did have a flu vaccine in November, she has had a Tdap also.  Consider pneumococcal vaccines.  Bone density and vitamin D level may be necessary as well.  Cc;Santiago, Lilia Argue, MD  Subjective:   Chief Complaint: colitis flare  HPI Adlynn is here today for follow-up, I met her in November for the first time, she has a history of ulcerative colitis treated in [place], she moved to [place] in 2018.  Today she is says she is having a flare, she does say she has missed some doses of her Lialda and knows that that could be contributing, she reports that she has diffuse abdominal soreness in her hands are getting dry and cracking and she says that is consistent with a flare  of her ulcerative colitis.  Is not describing significant diarrhea or bleeding etc.   Since I last saw her I did get to review her records.  Her history is summarized below  Dx 2007 [place] - hospitalized and then Tx ASA 6 MP x years Dr. Aniceto Boss off meds Readmitted 2016 - Tx IV steroids Then Entyvio - did well with UC but had some nausea, increased LFT's Colonoscopy mild rectal edema w/ micro colitis in ascending only Neg EGD Changed to Humira 2017 - rapid response - did well but moved to  and insurance change and could not afford Humira CRP and ESR do rise w/ flares  She does not have a Medicare part D plan.  She says she is still sorting through things.  She is disabled, her husband has just retired and I think he may have a pension and they are still trying to sort out their insurance.  She saw primary care yesterday she had a headache that was thought to be musculoskeletal and/or related to C-spine degenerative joint disease.  CBC and metabolic panel were okay, white count was slightly elevated, sed rate was normal.A CT scan of the head was ordered.  Allergies  Allergen Reactions  . Codeine Itching, Rash and Hives  . Latex Hives    With the power   Current Meds  Medication Sig  . amoxicillin (  AMOXIL) 500 MG capsule Take 500 mg by mouth 3 (three) times daily.  Marland Kitchen atorvastatin (LIPITOR) 40 MG tablet Take 1 tablet (40 mg total) by mouth daily.  . colchicine 0.6 MG tablet Take 1 tablet (0.6 mg total) by mouth 2 (two) times daily.  Marland Kitchen ibuprofen (ADVIL,MOTRIN) 600 MG tablet Take 1 tablet (600 mg total) by mouth every 8 (eight) hours as needed.  Marland Kitchen omeprazole (PRILOSEC) 20 MG capsule Take 1 capsule (20 mg total) by mouth daily.  Marland Kitchen triamcinolone cream (KENALOG) 0.1 % Apply 1 application topically 2 (two) times daily.   Past Medical History:  Diagnosis Date  . Anemia   . Carpal tunnel syndrome    bilateral  . Chronic heel pain   . DJD (degenerative joint disease) of cervical spine     MRI 2017  . Plantar fasciitis    bilateral  . UC (ulcerative colitis) ([place])    Past Surgical History:  Procedure Laterality Date  . ABDOMINAL HYSTERECTOMY    . APPENDECTOMY    . CESAREAN SECTION     x4  . COLONOSCOPY     Multiple in [place]  . ESOPHAGOGASTRODUODENOSCOPY     Social History   Social History Narrative   She is married, 3 sons born 1979, 1981, 1984.  One daughter born in 29.   She is medically retired from the [employer] after a fall and a hip injury, around 2004.   Caffeinated beverage every other day x1   07/13/2017   family history includes Breast cancer in her maternal aunt; Colon cancer (age of onset: 80) in her cousin; Diabetes in her maternal aunt and mother; Hypertension in her mother; Lung cancer in her maternal aunt, maternal aunt, maternal aunt, maternal grandmother, and mother; Other in her sister; Sickle cell anemia in her sister; Sickle cell anemia (age of onset: 59) in her sister; Sickle cell trait in her father and mother; Stroke in her mother.   Review of Systems As per HPI  Objective:   Physical Exam BP 124/80 (BP Location: Left Arm, Patient Position: Sitting, Cuff Size: Normal)   Pulse 60   Ht 5' 2" (1.575 m)   Wt 166 lb 8 oz (75.5 kg)   BMI 30.45 kg/m   NAD Eyes anicteric Lungs cta Cor S1s2 no rmg abd is soft and mildly diffusely tender

## 2017-09-22 NOTE — Patient Instructions (Addendum)
Your physician has requested that you go to the basement for the following lab work before leaving today: TPMT, Gold Plus, CRP  We have sent the following medications to your pharmacy for you to pick up at your convenience: Prednisone  We are giving you information to read on biologics.   Follow up with Dr Carlean Purl in 2 months, appointment made for 11/24/17 at 2:00pm  I appreciate the opportunity to care for you. Silvano Rusk, MD, [facility]

## 2017-09-22 NOTE — Progress Notes (Signed)
CONSULT NOTE  Patient Care Team: Rutherford Guys, MD as PCP - General (Family Medicine)  CHIEF COMPLAINTS/PURPOSE OF CONSULTATION:  Chronic leukocytosis  HISTORY OF PRESENTING ILLNESS:   Beverly Ramirez 57 y.o. female was referred to the clinic by her PCP, Dr. Pamella Pert for elevated WBC count noted on labs on 09/21/17.   She is doing well overall. Per patient most recent labs on 09/21/17, her WBC count was at 12.4k and lymphocytes at 4k compared to 13.2 on 08/04/2017. She notes that she has been evaluated for elevated WBCs in the past and she hasn't had an official diagnosis as of yet. She is unsure of her highest WBC count at this time.   She has a PMHx of Ulcerative Colitis and anemia. She was diagnosed with UC 8 years ago. She has been on occasional prednisone use and she was prescribed a prescription of 40 mg taper prednisone today due to extra intestinal flare with rash and arthritis in her hands.  She notes that she hasn't started this prescription as of yet. Within the past year she has had 2-3 rounds of prednisone with her most recent prescriptions being in September 2018 and January 2019. Her UC has affected a portion of her colon and she is unsure if her entire colon is affected. She denies being on steroid enemas for her UC. She denies a prior hx of C. Diff. She has had to be hospitalized for her UC with her last admittance being in 2017 at [facility]. She notes that she was informed that her liver count was elevated and she was evaluated by a hepatologist and was informed that it was due to her UC. She denies stents being placed. She has taken colchicine in the past for probable gout without an official diagnosis. She didn't have a joint aspiration to diagnose gout. She was advised to d/c red meat consumption. She was evaluated by a podiatrist and diagnosed with plantar fascitis.   She was on Humira last year for 4 months and she notes that she tolerated humara without any overt  side effects. Pt was on Entyvio prior to humira and was on it for approximately 1 year. Weyman Rodney was discontinued due to no longer aiding in her symptoms. Her Gastroenterologist is Dr. Silvano Rusk. Her most recent colonoscopy was in 2017. She is on Amoxil currently prior to a root canal dental procedure. Pt moved from [place] to [place] about 8 months ago. She had a partial hysterectomy due to fibroids and no longer has her menstrual cycle. She notes that the reason for the partial hysterectomy was due to menorrhagia. She just started back taking multivitamins and B12 complex vitamin. She has a follow up appointment with her PCP, Dr. Pamella Pert on 09/28/2017   On review of systems, she notes abdominal cramping and soreness, cracking to the skin of her bilateral hands (patient uses triamcinolone cream for this), mouth sores, vomiting, and joint pain to bilateral hands. She has had prior hx of blood in stools with her most recent case being in 2017 with her prior admittance. She has one bowel movement a day and denies diarrhea. She was given IV Iron several times. She denies any other symptoms.    MEDICAL HISTORY:  Past Medical History:  Diagnosis Date  . Anemia   . Carpal tunnel syndrome    bilateral  . Chronic heel pain   . DJD (degenerative joint disease) of cervical spine    MRI 2017  . Plantar fasciitis  bilateral  . UC (ulcerative colitis) ([place])     SURGICAL HISTORY: Past Surgical History:  Procedure Laterality Date  . ABDOMINAL HYSTERECTOMY    . APPENDECTOMY    . CESAREAN SECTION     x4  . COLONOSCOPY     Multiple in [place]  . ESOPHAGOGASTRODUODENOSCOPY      SOCIAL HISTORY: Social History   Socioeconomic History  . Marital status: Married    Spouse name: Simona Huh  . Number of children: 4  . Years of education: Not on file  . Highest education level: Not on file  Social Needs  . Financial resource strain: Not on file  . Food insecurity - worry: Not on file  . Food insecurity -  inability: Not on file  . Transportation needs - medical: Not on file  . Transportation needs - non-medical: Not on file  Occupational History  . Occupation: retired    Comment: He formerly worked for [employer] of [employer], disabled due to a fall  Tobacco Use  . Smoking status: Former Smoker    Last attempt to quit: 10/13/2006    Years since quitting: 10.9  . Smokeless tobacco: Never Used  Substance and Sexual Activity  . Alcohol use: Yes    Comment: occasional  . Drug use: No  . Sexual activity: Yes    Partners: Male    Comment: 1ST intercourse- 55, partners- 30, married- 53 yrs   Other Topics Concern  . Not on file  Social History Narrative   She is married, 3 sons born 1979, 1981, 1984.  One daughter born in 6.   She is medically retired from the [employer] after a fall and a hip injury, around 2004.   Caffeinated beverage every other day x1   07/13/2017    FAMILY HISTORY: Family History  Problem Relation Age of Onset  . Stroke Mother   . Diabetes Mother   . Hypertension Mother   . Lung cancer Mother   . Sickle cell trait Mother   . Breast cancer Maternal Aunt   . Diabetes Maternal Aunt   . Lung cancer Maternal Aunt   . Lung cancer Maternal Aunt   . Lung cancer Maternal Aunt   . Colon cancer Cousin 29  . Sickle cell trait Father   . Sickle cell anemia Sister 21  . Lung cancer Maternal Grandmother   . Sickle cell anemia Sister   . Other Sister        accident and blood clot formed    ALLERGIES:  is allergic to codeine and latex.  MEDICATIONS:  Current Outpatient Medications  Medication Sig Dispense Refill  . amoxicillin (AMOXIL) 500 MG capsule Take 500 mg by mouth 3 (three) times daily.    Marland Kitchen atorvastatin (LIPITOR) 40 MG tablet Take 1 tablet (40 mg total) by mouth daily. 90 tablet 1  . hydrOXYzine (ATARAX/VISTARIL) 10 MG tablet Take 10 mg as needed by mouth.    Marland Kitchen ibuprofen (ADVIL,MOTRIN) 600 MG tablet Take 1 tablet (600 mg  total) by mouth every 8 (eight) hours as needed. 90 tablet 2  . mesalamine (LIALDA) 1.2 g EC tablet Take 4.8 g daily with breakfast by mouth.    Marland Kitchen omeprazole (PRILOSEC) 20 MG capsule Take 1 capsule (20 mg total) by mouth daily. 30 capsule 3  . predniSONE (DELTASONE) 10 MG tablet 40 mg daily x 1 week, reduce by 10 mg weekly until gone 70 tablet 0  . triamcinolone cream (KENALOG)  0.1 % Apply 1 application topically 2 (two) times daily. 30 g 0   No current facility-administered medications for this visit.     REVIEW OF SYSTEMS:   Constitutional: Denies fevers, chills or abnormal night sweats Eyes: Denies blurriness of vision, double vision or watery eyes Ears, nose, mouth, throat, and face: Denies mucositis or sore throat. +mouth sores  Respiratory: Denies cough, dyspnea or wheezes Cardiovascular: Denies palpitation, chest discomfort or lower extremity swelling Gastrointestinal:  Denies nausea, heartburn or change in bowel habits. +abdominal cramping and soreness. +vomiting.  Skin: +Cracking of skin to bilateral hands.  Lymphatics: Denies new lymphadenopathy or easy bruising Neurological:Denies numbness, tingling or new weaknesses Musculoskeletal: +joint pain to bilateral hands.  Behavioral/Psych: Mood is stable, no new changes  All other systems were reviewed with the patient and are negative.  PHYSICAL EXAMINATION: Vitals:   09/23/17 1006  BP: (!) 144/87  Pulse: 77  Resp: 20  Temp: 98.4 F (36.9 C)  SpO2: 100%   Filed Weights   09/23/17 1006  Weight: 167 lb 3.2 oz (75.8 kg)     GENERAL:alert, no distress and comfortable SKIN: skin color, texture, turgor are normal. Scaly, lichen formed rash on fingers bilaterally.  EYES: normal, conjunctiva are pink and non-injected, sclera clear OROPHARYNX:no exudate, no erythema and lips, buccal mucosa, and tongue normal. A few apthous like ulcers in the mouth.  NECK: supple, thyroid normal size, non-tender, without nodularity LYMPH:  no  palpable lymphadenopathy in the cervical, axillary or inguinal LUNGS: clear to auscultation and percussion with normal breathing effort HEART: regular rate & rhythm and no murmurs and no lower extremity edema ABDOMEN:abdomen soft and normal bowel sounds. General non-focal tenderness over all quadrants. No guarding, rigidity, or rebound.   Musculoskeletal:no cyanosis of digits and no clubbing. synovitis in bilateral hands, more prominent the proximal interphalanx joints.  PSYCH: alert & oriented x 3 with fluent speech NEURO: no focal motor/sensory deficits  LABORATORY DATA:   . CBC Latest Ref Rng & Units 09/23/2017 09/21/2017 08/04/2017  WBC 3.9 - 10.3 K/uL 12.6(H) 12.4(H) 13.2(H)  Hemoglobin 11.6 - 15.9 g/dL 12.4 11.9 13.3  Hematocrit 34.8 - 46.6 % 37.4 36.3 38.5  Platelets 145 - 400 K/uL 320 350 330   . CBC    Component Value Date/Time   WBC 12.6 (H) 09/23/2017 1059   RBC 4.35 09/23/2017 1059   RBC 4.35 09/23/2017 1059   HGB 12.4 09/23/2017 1059   HGB 11.9 09/21/2017 1333   HCT 37.4 09/23/2017 1059   HCT 36.3 09/21/2017 1333   PLT 320 09/23/2017 1059   PLT 350 09/21/2017 1333   MCV 86.0 09/23/2017 1059   MCV 86 09/21/2017 1333   MCH 28.5 09/23/2017 1059   MCHC 33.2 09/23/2017 1059   RDW 12.6 09/23/2017 1059   RDW 13.6 09/21/2017 1333   LYMPHSABS 3.4 (H) 09/23/2017 1059   LYMPHSABS 4.0 (H) 09/21/2017 1333   MONOABS 0.9 09/23/2017 1059   EOSABS 1.1 (H) 09/23/2017 1059   EOSABS 1.0 (H) 09/21/2017 1333   BASOSABS 0.0 09/23/2017 1059   BASOSABS 0.0 09/21/2017 1333   . CMP Latest Ref Rng & Units 09/23/2017 09/21/2017 08/04/2017  Glucose 70 - 140 mg/dL 93 123(H) 109(H)  BUN 7 - 26 mg/dL 10 9 12   Creatinine 0.57 - 1.00 mg/dL - 0.75 0.89  Sodium 136 - 145 mmol/L 140 140 143  Potassium 3.3 - 4.7 mmol/L 3.6 3.8 4.0  Chloride 98 - 109 mmol/L 105 100 102  CO2 22 - 29  mmol/L 28 27 26   Calcium 8.4 - 10.4 mg/dL 9.4 9.5 9.7  Total Protein 6.4 - 8.3 g/dL 7.2 6.8 7.1  Total Bilirubin  0.2 - 1.2 mg/dL 0.5 0.2 0.3  Alkaline Phos 40 - 150 U/L 94 88 102  AST 5 - 34 U/L 17 16 22   ALT 0 - 55 U/L 23 23 37(H)   Component     Latest Ref Rng & Units 09/23/2017  Iron     41 - 142 ug/dL 68  TIBC     236 - 444 ug/dL 288  Saturation Ratios     21 - 57 % 24  UIBC     ug/dL 220  Retic Ct Pct     0.7 - 2.1 % 2.8 (H)  RBC.     3.70 - 5.45 MIL/uL 4.35  Retic Count, Absolute     33.7 - 90.7 K/uL 121.8 (H)  LDH     125 - 245 U/L 240  TSH     0.308 - 3.960 uIU/mL 0.902  Ferritin     9 - 269 ng/mL 186  Vitamin B12     180 - 914 pg/mL 560  Sed Rate     0 - 22 mm/hr 18  CRP     <1.0 mg/dL <0.8    RADIOGRAPHIC STUDIES: I have personally reviewed the radiological images as listed and agreed with the findings in the report. No results found.  ASSESSMENT & PLAN:   57 y.o. female with a prior hx of Ulcerative colitis.   1. Leukocytosis secondary to acute on chronic inflammation from Ulcerative colitis  Her leucocytosis has been chronic since atleast 2017 and seems to wax and wane based on UC flares. Neutrophilia on and off could also be from steroid use.  Plan -Discussed recent labs with patient today.  -Advised that this appears chronic and improved and it can be atttributed to her hx of UC as the WBC counts appear reactive and not progressive and has been present for many years.  -Prior workup consisted of clonal study, BCR-ABL to rule out CML was negative in July 2017. Tryptase levels normal and it appears non-clonal.  -WBC at 12.4k, lymphocytes at 4.0, monocytes at 1.0 EOS at 1.0 as of 09/21/17 -will send out further study for elevated lymphocytes - flow cytometry was done and showed non clonal lymphocytes. -CRP at 0.6 as of 09/22/2017, Sed rate at 10 as of 09/21/17,  -PBS - no increased blasts. Normal myeloid maturation. -Advised that prednisone use as well as an UC flare could elevate WBC count.  -Advised the patient to maintain follow up with Dr. Carlean Purl for UC to  optimize treatment for UC  -Follow up in 6 months with labs      2. . Patient Active Problem List   Diagnosis Date Noted  . Hyperlipidemia 08/04/2017  . Ulcerative colitis without complications ([place]) 85/88/5027  . Plantar fasciitis 05/28/2017   -f/u with PCP for continued management.   Plan:  Labs today RTC with Dr Kambryn Dapolito in 6 months with labs  All questions were answered. The patient knows to call the clinic with any problems, questions or concerns. I spent 35 minutes counseling the patient face to face. The total time spent in the appointment was 45 minutes and more than 50% was on counseling.   Brunetta Genera MD MS  This document serves as a record of services personally performed by Sullivan Lone, MD. It was created on his behalf by [facility],  a trained medical scribe. The creation of this record is based on the scribe's personal observations and the provider's statements to them.   .I have reviewed the above documentation for accuracy and completeness, and I agree with the above. Brunetta Genera MD

## 2017-09-23 ENCOUNTER — Telehealth: Payer: Self-pay | Admitting: Hematology

## 2017-09-23 ENCOUNTER — Encounter: Payer: Self-pay | Admitting: Hematology

## 2017-09-23 ENCOUNTER — Inpatient Hospital Stay: Payer: Medicare Other | Attending: Hematology | Admitting: Hematology

## 2017-09-23 ENCOUNTER — Inpatient Hospital Stay: Payer: Medicare Other

## 2017-09-23 VITALS — BP 144/87 | HR 77 | Temp 98.4°F | Resp 20 | Ht 62.0 in | Wt 167.2 lb

## 2017-09-23 DIAGNOSIS — K51918 Ulcerative colitis, unspecified with other complication: Secondary | ICD-10-CM

## 2017-09-23 DIAGNOSIS — D509 Iron deficiency anemia, unspecified: Secondary | ICD-10-CM

## 2017-09-23 DIAGNOSIS — R109 Unspecified abdominal pain: Secondary | ICD-10-CM

## 2017-09-23 DIAGNOSIS — Z90710 Acquired absence of both cervix and uterus: Secondary | ICD-10-CM | POA: Diagnosis not present

## 2017-09-23 DIAGNOSIS — M722 Plantar fascial fibromatosis: Secondary | ICD-10-CM

## 2017-09-23 DIAGNOSIS — R112 Nausea with vomiting, unspecified: Secondary | ICD-10-CM | POA: Diagnosis not present

## 2017-09-23 DIAGNOSIS — D649 Anemia, unspecified: Secondary | ICD-10-CM

## 2017-09-23 DIAGNOSIS — D72828 Other elevated white blood cell count: Secondary | ICD-10-CM

## 2017-09-23 DIAGNOSIS — Z87891 Personal history of nicotine dependence: Secondary | ICD-10-CM

## 2017-09-23 DIAGNOSIS — K1379 Other lesions of oral mucosa: Secondary | ICD-10-CM | POA: Diagnosis not present

## 2017-09-23 DIAGNOSIS — E785 Hyperlipidemia, unspecified: Secondary | ICD-10-CM

## 2017-09-23 DIAGNOSIS — Z800 Family history of malignant neoplasm of digestive organs: Secondary | ICD-10-CM | POA: Diagnosis not present

## 2017-09-23 DIAGNOSIS — Z801 Family history of malignant neoplasm of trachea, bronchus and lung: Secondary | ICD-10-CM | POA: Diagnosis not present

## 2017-09-23 DIAGNOSIS — Z79899 Other long term (current) drug therapy: Secondary | ICD-10-CM | POA: Diagnosis not present

## 2017-09-23 DIAGNOSIS — E538 Deficiency of other specified B group vitamins: Secondary | ICD-10-CM

## 2017-09-23 DIAGNOSIS — M479 Spondylosis, unspecified: Secondary | ICD-10-CM

## 2017-09-23 DIAGNOSIS — K51911 Ulcerative colitis, unspecified with rectal bleeding: Secondary | ICD-10-CM

## 2017-09-23 DIAGNOSIS — D696 Thrombocytopenia, unspecified: Secondary | ICD-10-CM | POA: Diagnosis not present

## 2017-09-23 DIAGNOSIS — M2550 Pain in unspecified joint: Secondary | ICD-10-CM

## 2017-09-23 DIAGNOSIS — Z9049 Acquired absence of other specified parts of digestive tract: Secondary | ICD-10-CM | POA: Diagnosis not present

## 2017-09-23 DIAGNOSIS — D72829 Elevated white blood cell count, unspecified: Secondary | ICD-10-CM

## 2017-09-23 LAB — CMP (CANCER CENTER ONLY)
ALBUMIN: 3.6 g/dL (ref 3.5–5.0)
ALT: 23 U/L (ref 0–55)
AST: 17 U/L (ref 5–34)
Alkaline Phosphatase: 94 U/L (ref 40–150)
Anion gap: 7 (ref 3–11)
BUN: 10 mg/dL (ref 7–26)
CHLORIDE: 105 mmol/L (ref 98–109)
CO2: 28 mmol/L (ref 22–29)
CREATININE: 0.78 mg/dL (ref 0.60–1.10)
Calcium: 9.4 mg/dL (ref 8.4–10.4)
GFR, Estimated: >60 mL/min (ref >60)
GLUCOSE: 93 mg/dL (ref 70–140)
Potassium: 3.6 mmol/L (ref 3.3–4.7)
SODIUM: 140 mmol/L (ref 136–145)
Total Bilirubin: 0.5 mg/dL (ref 0.2–1.2)
Total Protein: 7.2 g/dL (ref 6.4–8.3)

## 2017-09-23 LAB — RETICULOCYTES
RBC.: 4.35 MIL/uL (ref 3.70–5.45)
RETIC COUNT ABSOLUTE: 121.8 10*3/uL — AB (ref 33.7–90.7)
RETIC CT PCT: 2.8 % — AB (ref 0.7–2.1)

## 2017-09-23 LAB — CBC WITH DIFFERENTIAL/PLATELET
BASOS ABS: 0 10*3/uL (ref 0.0–0.1)
BASOS PCT: 0 %
EOS ABS: 1.1 10*3/uL — AB (ref 0.0–0.5)
Eosinophils Relative: 9 %
HEMATOCRIT: 37.4 % (ref 34.8–46.6)
HEMOGLOBIN: 12.4 g/dL (ref 11.6–15.9)
Lymphocytes Relative: 27 %
Lymphs Abs: 3.4 10*3/uL — ABNORMAL HIGH (ref 0.9–3.3)
MCH: 28.5 pg (ref 25.1–34.0)
MCHC: 33.2 g/dL (ref 31.5–36.0)
MCV: 86 fL (ref 79.5–101.0)
Monocytes Absolute: 0.9 10*3/uL (ref 0.1–0.9)
Monocytes Relative: 7 %
NEUTROS ABS: 7.1 10*3/uL — AB (ref 1.5–6.5)
NEUTROS PCT: 57 %
Platelets: 320 10*3/uL (ref 145–400)
RBC: 4.35 MIL/uL (ref 3.70–5.45)
RDW: 12.6 % (ref 11.2–16.1)
WBC: 12.6 10*3/uL — AB (ref 3.9–10.3)

## 2017-09-23 LAB — IRON AND TIBC
Iron: 68 ug/dL (ref 41–142)
Saturation Ratios: 24 % (ref 21–57)
TIBC: 288 ug/dL (ref 236–444)
UIBC: 220 ug/dL

## 2017-09-23 LAB — FERRITIN: Ferritin: 186 ng/mL (ref 9–269)

## 2017-09-23 LAB — VITAMIN B12: VITAMIN B 12: 560 pg/mL (ref 180–914)

## 2017-09-23 LAB — TSH: TSH: 0.902 u[IU]/mL (ref 0.308–3.960)

## 2017-09-23 LAB — C-REACTIVE PROTEIN

## 2017-09-23 LAB — LACTATE DEHYDROGENASE: LDH: 240 U/L (ref 125–245)

## 2017-09-23 LAB — SEDIMENTATION RATE: SED RATE: 18 mm/h (ref 0–22)

## 2017-09-23 NOTE — Telephone Encounter (Signed)
Scheduled appt per 1/16 los - Gave patient aVS and calender per los.

## 2017-09-24 ENCOUNTER — Encounter: Payer: Self-pay | Admitting: Family Medicine

## 2017-09-25 ENCOUNTER — Other Ambulatory Visit: Payer: Self-pay

## 2017-09-25 ENCOUNTER — Ambulatory Visit (INDEPENDENT_AMBULATORY_CARE_PROVIDER_SITE_OTHER): Payer: Medicare Other | Admitting: Family Medicine

## 2017-09-25 ENCOUNTER — Encounter: Payer: Self-pay | Admitting: Family Medicine

## 2017-09-25 VITALS — BP 130/82 | HR 98 | Temp 97.8°F | Ht 62.0 in | Wt 166.0 lb

## 2017-09-25 DIAGNOSIS — K5190 Ulcerative colitis, unspecified, without complications: Secondary | ICD-10-CM | POA: Diagnosis not present

## 2017-09-25 DIAGNOSIS — R079 Chest pain, unspecified: Secondary | ICD-10-CM

## 2017-09-25 DIAGNOSIS — G4452 New daily persistent headache (NDPH): Secondary | ICD-10-CM

## 2017-09-25 DIAGNOSIS — D72829 Elevated white blood cell count, unspecified: Secondary | ICD-10-CM | POA: Diagnosis not present

## 2017-09-25 MED ORDER — CYCLOBENZAPRINE HCL 10 MG PO TABS: ORAL_TABLET | ORAL | 0 refills | Status: DC

## 2017-09-25 MED ORDER — COLCHICINE 0.6 MG PO TABS: 0.6000 mg | ORAL_TABLET | Freq: Two times a day (BID) | ORAL | 2 refills | Status: DC

## 2017-09-25 NOTE — Patient Instructions (Signed)
     IF you received an x-ray today, you will receive an invoice from [facility]. Please contact [facility] at [phone] with questions or concerns regarding your invoice.   IF you received labwork today, you will receive an invoice from [facility]. Please contact [facility] at [phone] with questions or concerns regarding your invoice.   Our billing staff will not be able to assist you with questions regarding bills from these companies.  You will be contacted with the lab results as soon as they are available. The fastest way to get your results is to activate your My Chart account. Instructions are located on the last page of this paperwork. If you have not heard from us regarding the results in 2 weeks, please contact this office.        IF you received an x-ray today, you will receive an invoice from [facility]. Please contact [facility] at [phone] with questions or concerns regarding your invoice.   IF you received labwork today, you will receive an invoice from [facility]. Please contact [facility] at [phone] with questions or concerns regarding your invoice.   Our billing staff will not be able to assist you with questions regarding bills from these companies.  You will be contacted with the lab results as soon as they are available. The fastest way to get your results is to activate your My Chart account. Instructions are located on the last page of this paperwork. If you have not heard from us regarding the results in 2 weeks, please contact this office.     

## 2017-09-28 LAB — QUANTIFERON-TB GOLD PLUS
NIL: 0.04 [IU]/mL
QUANTIFERON-TB GOLD PLUS: NEGATIVE
TB1-NIL: <0 IU/mL
TB2-NIL: <0 IU/mL

## 2017-09-28 LAB — THIOPURINE METHYLTRANSFERASE (TPMT), RBC: Thiopurine Methyltransferase, RBC: 10 nmol/hr/mL RBC — ABNORMAL LOW

## 2017-10-04 DIAGNOSIS — D72829 Elevated white blood cell count, unspecified: Secondary | ICD-10-CM | POA: Insufficient documentation

## 2017-10-04 NOTE — Progress Notes (Signed)
1/27/20196:06 PM  Beverly Ramirez [birth year], 57 y.o. female [id number]  Chief Complaint  Patient presents with  . Follow-up    HPI:   Patient is a 57 y.o. female with past medical history significant for UC who presents today for followup on chest pain that was suggestive of pericarditis and new onset headaches, presumed to be MSK 2/2 cervical DDD.  Chest pain has resolved. On NSAIDs, pred (GI)  and colchicine. Tolerating meds well.  Patient is also requesting muscle relaxant for headaches, no changes with current antiinflammatories. CT scan pending.   Since our last appt she has seen GI who started prednisone for UC flareup. Considering starting 6-MP.   Also saw heme-onc, chronic leukocytosis, reactive related to UC, workup for malignancy negative  Depression screen [facility] 2/9 09/25/2017 09/21/2017 08/04/2017  Decreased Interest 0 0 0  Down, Depressed, Hopeless 0 0 0  PHQ - 2 Score 0 0 0    Allergies  Allergen Reactions  . Codeine Itching, Rash and Hives  . Latex Hives    With the power    Prior to Admission medications   Medication Sig Start Date End Date Taking? Authorizing Provider  amoxicillin (AMOXIL) 500 MG capsule Take 500 mg by mouth 3 (three) times daily.   Yes [provider]  atorvastatin (LIPITOR) 40 MG tablet Take 1 tablet (40 mg total) by mouth daily. 08/25/17  Yes Rutherford Guys, MD  hydrOXYzine (ATARAX/VISTARIL) 10 MG tablet Take 10 mg as needed by mouth.   Yes [provider]  mesalamine (LIALDA) 1.2 g EC tablet Take 4.8 g daily with breakfast by mouth.   Yes [provider]  omeprazole (PRILOSEC) 20 MG capsule Take 1 capsule (20 mg total) by mouth daily. 09/21/17  Yes Rutherford Guys, MD  predniSONE (DELTASONE) 10 MG tablet 40 mg daily x 1 week, reduce by 10 mg weekly until gone 09/22/17  Yes Gatha Mayer, MD  triamcinolone cream (KENALOG) 0.1 % Apply 1 application topically 2 (two) times daily. 08/04/17  Yes Rutherford Guys, MD  colchicine 0.6 MG tablet Take 1 tablet (0.6 mg total) by mouth 2 (two) times daily. 09/25/17   Rutherford Guys, MD    Past Medical History:  Diagnosis Date  . Anemia   . Carpal tunnel syndrome    bilateral  . Chronic heel pain   . DJD (degenerative joint disease) of cervical spine    MRI 2017  . Plantar fasciitis    bilateral  . UC (ulcerative colitis) ([place])     Past Surgical History:  Procedure Laterality Date  . ABDOMINAL HYSTERECTOMY    . APPENDECTOMY    . CESAREAN SECTION     x4  . COLONOSCOPY     Multiple in [place]  . ESOPHAGOGASTRODUODENOSCOPY      Social History   Tobacco Use  . Smoking status: Former Smoker    Last attempt to quit: 10/13/2006    Years since quitting: 10.9  . Smokeless tobacco: Never Used  Substance Use Topics  . Alcohol use: Yes    Comment: occasional    Family History  Problem Relation Age of Onset  . Stroke Mother   . Diabetes Mother   . Hypertension Mother   . Lung cancer Mother   . Sickle cell trait Mother   . Breast cancer Maternal Aunt   . Diabetes Maternal Aunt   . Lung cancer Maternal Aunt   . Lung cancer Maternal Aunt   . Lung  cancer Maternal Aunt   . Colon cancer Cousin 68  . Sickle cell trait Father   . Sickle cell anemia Sister 53  . Lung cancer Maternal Grandmother   . Sickle cell anemia Sister   . Other Sister        accident and blood clot formed    Review of Systems  Constitutional: Negative for chills and fever.  HENT: Negative for congestion, ear pain, sinus pain and sore throat.   Eyes: Negative for blurred vision and double vision.  Respiratory: Negative for cough and shortness of breath.   Cardiovascular: Negative for chest pain, palpitations and leg swelling.  Gastrointestinal: Positive for abdominal pain and diarrhea. Negative for blood in stool, melena, nausea and vomiting.  Musculoskeletal: Positive for neck pain.  Neurological: Positive for headaches. Negative for dizziness, sensory  change, speech change and focal weakness.     OBJECTIVE:  Blood pressure 130/82, pulse 98, temperature 97.8 F (36.6 C), temperature source Oral, height 5' 2"  (1.575 m), weight 166 lb (75.3 kg), SpO2 97 %.  Physical Exam  Constitutional: She is oriented to person, place, and time and well-developed, well-nourished, and in no distress.  HENT:  Head: Normocephalic and atraumatic.  Mouth/Throat: Oropharynx is clear and moist. No oropharyngeal exudate.  Eyes: EOM are normal. Pupils are equal, round, and reactive to light. No scleral icterus.  Neck: Neck supple.  Cardiovascular: Normal rate, regular rhythm and normal heart sounds. Exam reveals no gallop and no friction rub.  No murmur heard. Pulmonary/Chest: Effort normal and breath sounds normal. She has no wheezes. She has no rales.  Musculoskeletal: She exhibits no edema.  Neurological: She is alert and oriented to person, place, and time. Gait normal.  Skin: Skin is warm and dry.    ASSESSMENT and PLAN  1. Chest pain, unspecified type Discussed stopping NSAIDs while on prednisone, discussed once again treatment for presumed pericarditis. Meds r/se/b reviewed. ER preacutions given.  2. New daily persistent headache Prescribing flexeril. CT pending, ER precautions given.  3. Ulcerative colitis without complications, unspecified location [facility]) Per GI.   4. Leukocytosis, unspecified type Reactive per heme onc, most likely from UC flareups.   Other orders - colchicine 0.6 MG tablet; Take 1 tablet (0.6 mg total) by mouth 2 (two) times daily. - cyclobenzaprine (FLEXERIL) 10 MG tablet; Take 1 tablet (10 mg total) by mouth at bedtime. May also take 0.5 tablets (5 mg total) 2 (two) times daily as needed for muscle spasms.  Return in about 4 weeks (around 10/23/2017).    Rutherford Guys, MD [facility] at [place], [place] [address] Ph.  [phone] Fax [phone]

## 2017-10-06 ENCOUNTER — Encounter: Payer: Self-pay | Admitting: Family Medicine

## 2017-10-06 ENCOUNTER — Ambulatory Visit (INDEPENDENT_AMBULATORY_CARE_PROVIDER_SITE_OTHER): Payer: Medicare Other | Admitting: Family Medicine

## 2017-10-06 ENCOUNTER — Ambulatory Visit (INDEPENDENT_AMBULATORY_CARE_PROVIDER_SITE_OTHER): Payer: Medicare Other

## 2017-10-06 ENCOUNTER — Other Ambulatory Visit: Payer: Self-pay

## 2017-10-06 VITALS — BP 141/87 | HR 86 | Temp 98.1°F | Ht 62.0 in | Wt 166.0 lb

## 2017-10-06 DIAGNOSIS — R05 Cough: Secondary | ICD-10-CM | POA: Diagnosis not present

## 2017-10-06 DIAGNOSIS — R079 Chest pain, unspecified: Secondary | ICD-10-CM | POA: Diagnosis not present

## 2017-10-06 DIAGNOSIS — R059 Cough, unspecified: Secondary | ICD-10-CM

## 2017-10-06 IMAGING — DX DG CHEST 2V
2 series · 2 of 2 positions shown · non-contrast
Comparison: [DATE]

CLINICAL DATA: Productive cough, chest pain

EXAM:
CHEST  2 VIEW

[chest pa]
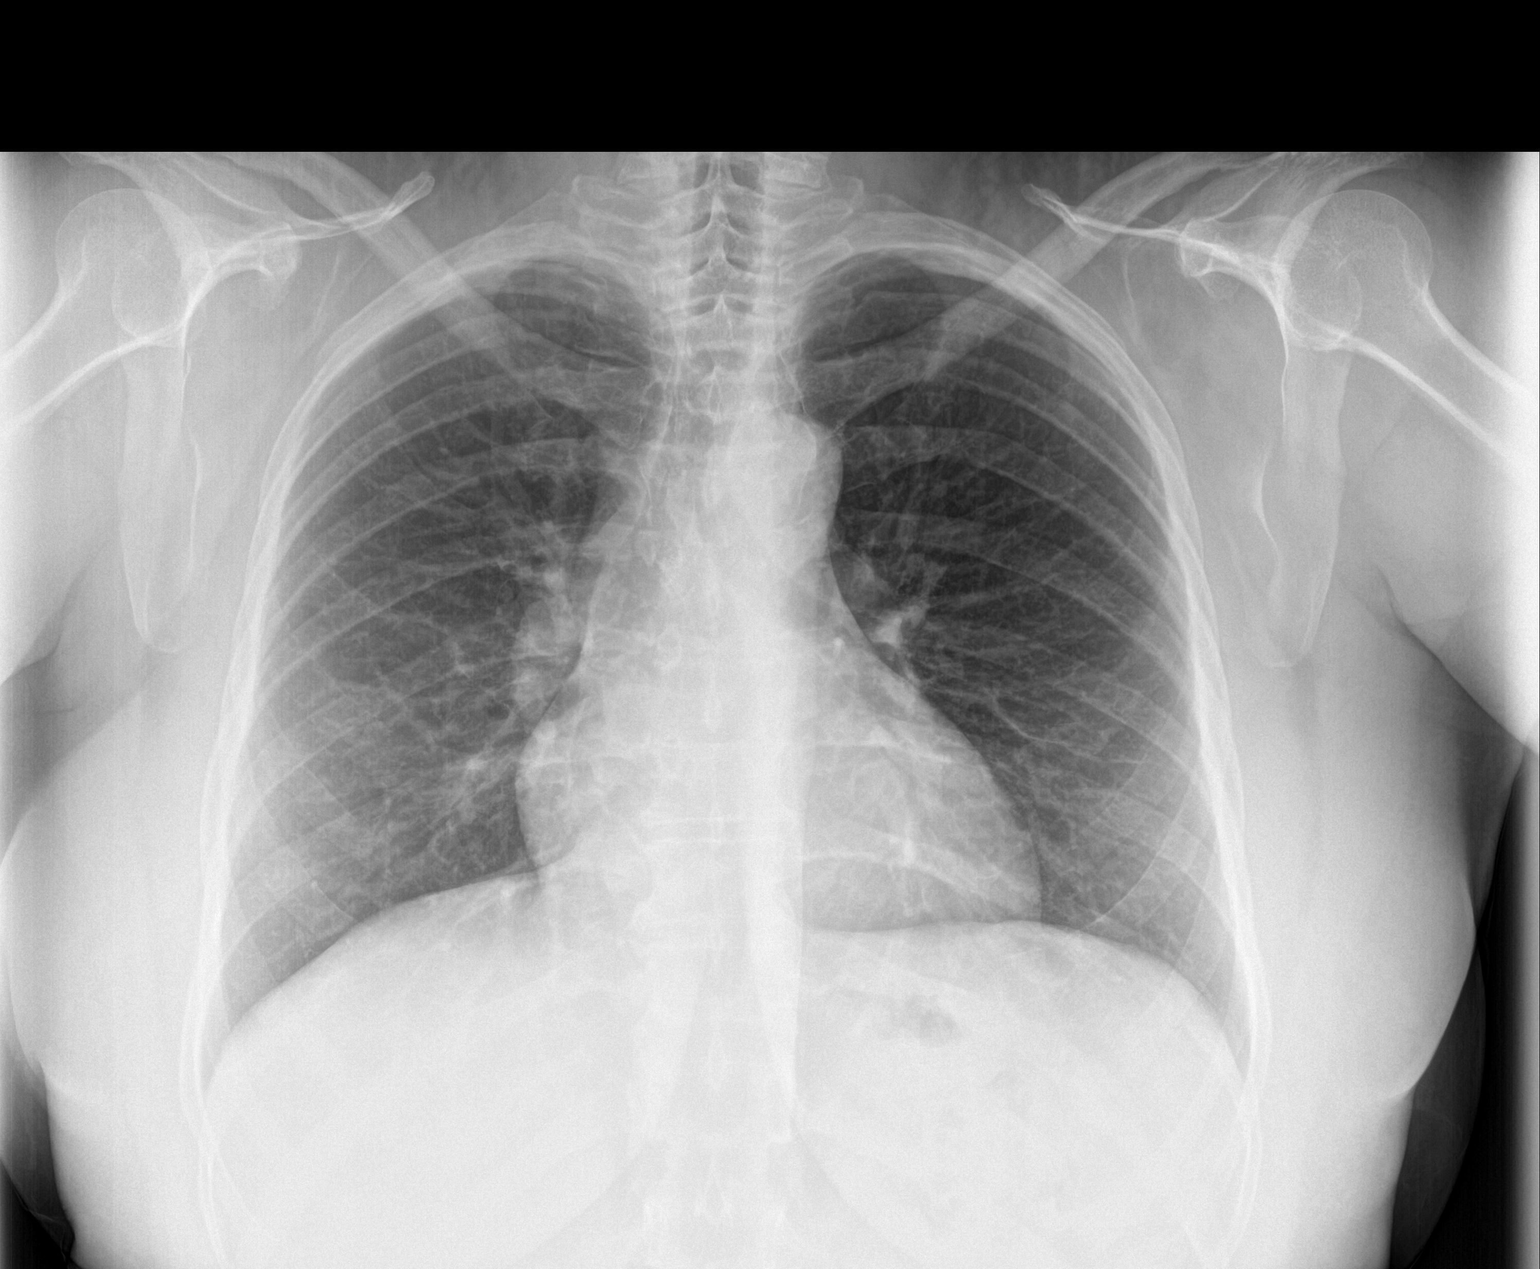

[chest lat]
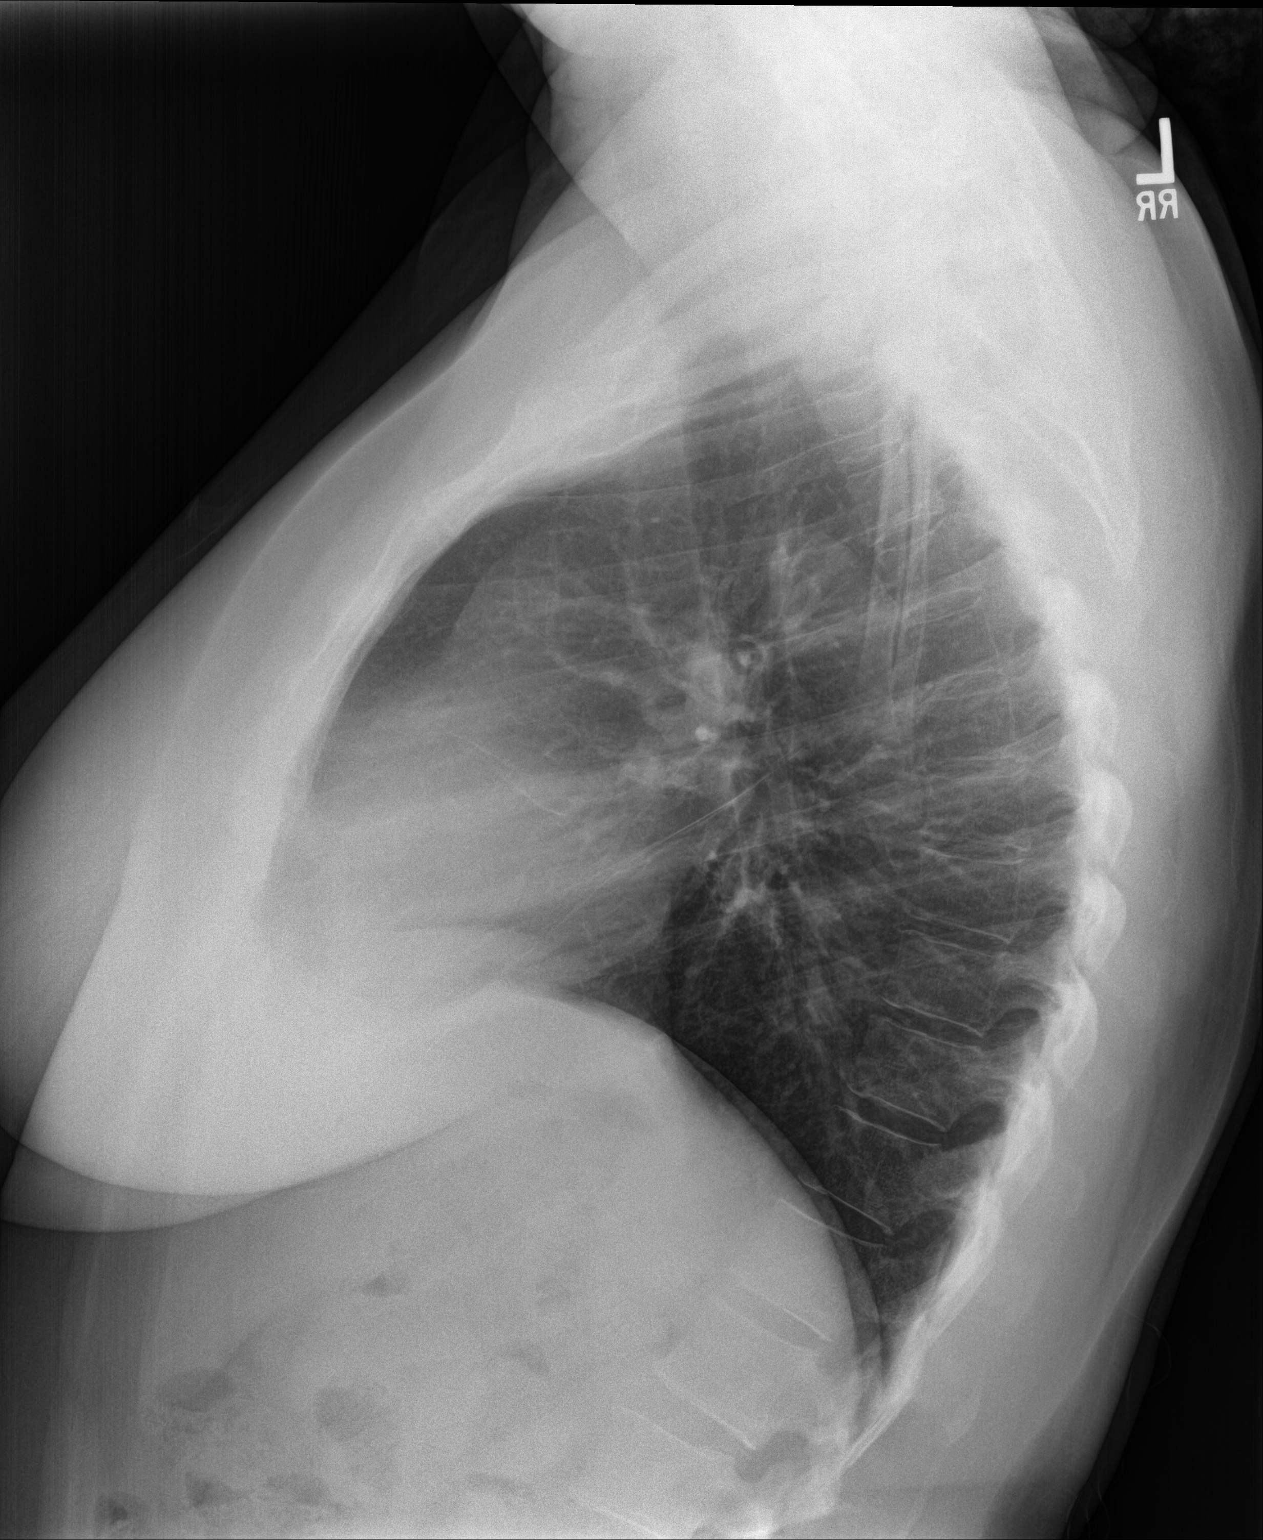

[2 of 2 positions shown; findings below may reference images not displayed]

FINDINGS: The heart size and mediastinal contours are within normal limits.
Both lungs are clear. The visualized skeletal structures are
unremarkable.
IMPRESSION: No active cardiopulmonary disease.

## 2017-10-06 NOTE — Patient Instructions (Signed)
     IF you received an x-ray today, you will receive an invoice from [facility]. Please contact [facility] at [phone] with questions or concerns regarding your invoice.   IF you received labwork today, you will receive an invoice from [facility]. Please contact [facility] at [phone] with questions or concerns regarding your invoice.   Our billing staff will not be able to assist you with questions regarding bills from these companies.  You will be contacted with the lab results as soon as they are available. The fastest way to get your results is to activate your My Chart account. Instructions are located on the last page of this paperwork. If you have not heard from us regarding the results in 2 weeks, please contact this office.     

## 2017-10-06 NOTE — Progress Notes (Signed)
1/29/20199:40 AM  Beverly Ramirez 10-Jun-1961, 57 y.o. female [id number]  Chief Complaint  Patient presents with  . Follow-up    STILL HAVING CHEST PAIN    HPI:   Patient is a 57 y.o. female with past medical history significant for UC who presents today for followup.  Seen several weeks ago for stabbing chest pain that happend with deep breathing or whenever she laid back. She felt most comfortable with resolution of chest pain when leading forward. She felt pain radiated towards her back. Chest pain was not exertional. It started with onset of UC flareup. She was treated empirically for mild pericarditis. Was doing well, chest pain had resolved. GI started her on prednisone, currently on 64m daily after a week of 438mdaily. Therefore NSAIDs were stopped but colchicine was continued.   She was doing well until Friday, 4 days ago, when she started having sharp stabbing mid chest pain with breathing only, not exertional. Chest pain radiates into her back, now with irritated throat and productive cough and mild SOB. Denies any fever or chills, denies any h/o asthma, does not smoke. Denies chest pain being positional. She does have hyperlipidemia.  She is on a PPI, denies heartburn. Pain not triggered by food.   [facility] 10/06/2017 09/25/2017 09/21/2017  Decreased Interest 0 0 0  Down, Depressed, Hopeless 0 0 0  PHQ - 2 Score 0 0 0    Allergies  Allergen Reactions  . Codeine Itching, Rash and Hives  . Latex Hives    With the power    Prior to Admission medications   Medication Sig Start Date End Date Taking? Authorizing Provider  amoxicillin (AMOXIL) 500 MG capsule Take 500 mg by mouth 3 (three) times daily.   Yes [provider]  atorvastatin (LIPITOR) 40 MG tablet Take 1 tablet (40 mg total) by mouth daily. 08/25/17  Yes SaRutherford GuysMD  colchicine 0.6 MG tablet Take 1 tablet (0.6 mg total) by mouth 2 (two) times daily. 09/25/17  Yes SaRutherford GuysMD  cyclobenzaprine (FLEXERIL) 10 MG tablet Take 1 tablet (10 mg total) by mouth at bedtime. May also take 0.5 tablets (5 mg total) 2 (two) times daily as needed for muscle spasms. 09/25/17  Yes SaRutherford GuysMD  mesalamine (LIALDA) 1.2 g EC tablet Take 4.8 g daily with breakfast by mouth.   Yes [provider]  omeprazole (PRILOSEC) 20 MG capsule Take 1 capsule (20 mg total) by mouth daily. 09/21/17  Yes SaRutherford GuysMD  predniSONE (DELTASONE) 10 MG tablet 40 mg daily x 1 week, reduce by 10 mg weekly until gone 09/22/17  Yes GeGatha MayerMD  triamcinolone cream (KENALOG) 0.1 % Apply 1 application topically 2 (two) times daily. 08/04/17  Yes SaRutherford GuysMD  hydrOXYzine (ATARAX/VISTARIL) 10 MG tablet Take 10 mg as needed by mouth.    [provider]    Past Medical History:  Diagnosis Date  . Anemia   . Carpal tunnel syndrome    bilateral  . Chronic heel pain   . DJD (degenerative joint disease) of cervical spine    MRI 2017  . Plantar fasciitis    bilateral  . UC (ulcerative colitis) ([facility]    Past Surgical History:  Procedure Laterality Date  . ABDOMINAL HYSTERECTOMY    . APPENDECTOMY    . CESAREAN SECTION     x4  . [place]  . ESOPHAGOGASTRODUODENOSCOPY      Social History   Tobacco Use  . Smoking status: Former Smoker    Last attempt to quit: 10/13/2006    Years since quitting: 10.9  . Smokeless tobacco: Never Used  Substance Use Topics  . Alcohol use: Yes    Comment: occasional    Family History  Problem Relation Age of Onset  . Stroke Mother   . Diabetes Mother   . Hypertension Mother   . Lung cancer Mother   . Sickle cell trait Mother   . Breast cancer Maternal Aunt   . Diabetes Maternal Aunt   . Lung cancer Maternal Aunt   . Lung cancer Maternal Aunt   . Lung cancer Maternal Aunt   . Colon cancer Cousin 57  . Sickle cell trait Father   . Sickle cell anemia Sister 51  . Lung cancer  Maternal Grandmother   . Sickle cell anemia Sister   . Other Sister        accident and blood clot formed    ROS Per hpi  OBJECTIVE:  Blood pressure (!) 141/87, pulse 86, temperature 98.1 F (36.7 C), temperature source Oral, height 5' 2"  (1.575 m), weight 166 lb (75.3 kg), SpO2 97 %.  Peak flow: 450, normal  Physical Exam  Constitutional: She is oriented to person, place, and time and well-developed, well-nourished, and in no distress.  HENT:  Head: Normocephalic and atraumatic.  Mouth/Throat: Oropharynx is clear and moist. No oropharyngeal exudate.  Eyes: EOM are normal. Pupils are equal, round, and reactive to light. No scleral icterus.  Neck: Neck supple.  Cardiovascular: Normal rate, regular rhythm and normal heart sounds. Exam reveals no gallop and no friction rub.  No murmur heard. Pulmonary/Chest: Effort normal and breath sounds normal. She has no wheezes. She has no rales.  Musculoskeletal: She exhibits no edema.  Neurological: She is alert and oriented to person, place, and time. Gait normal.  Skin: Skin is warm and dry.    EKG: NSR, HR 66, nonspecific T wave changes, unchanged when compared to EKG 09/21/17  Dg Chest 2 View  Result Date: 10/06/2017 CLINICAL DATA:  Productive cough, chest pain EXAM: CHEST  2 VIEW COMPARISON:  08/04/2017 FINDINGS: The heart size and mediastinal contours are within normal limits. Both lungs are clear. The visualized skeletal structures are unremarkable. IMPRESSION: No active cardiopulmonary disease. Electronically Signed   By: Kathreen Devoid   On: 10/06/2017 11:14     ASSESSMENT and PLAN  1. Chest pain, unspecified type EKG and CXR unremarkable in clinic, empirically being treated for mild pericarditis, return of symptoms seems to be related to mild decrease in pred dose, I have asked patient to increase back to 37m for 1 week and see if pain resolves. Resume taper per GI instructions after that. RTC precautions reviewed.  - EKG  12-Lead - DG Chest 2 View; Future  2. Cough - DG Chest 2 View; Future  Other orders - Check Peak Flow  Return in about 2 weeks (around 10/20/2017).    IRutherford Guys MD [facility] [place] [address]Ph.  [phone] [phone]

## 2017-10-13 ENCOUNTER — Other Ambulatory Visit: Payer: Medicare Other

## 2017-10-19 ENCOUNTER — Other Ambulatory Visit: Payer: Self-pay | Admitting: Internal Medicine

## 2017-11-02 ENCOUNTER — Other Ambulatory Visit: Payer: Self-pay | Admitting: Internal Medicine

## 2017-11-05 ENCOUNTER — Ambulatory Visit: Payer: Medicare Other | Admitting: Family Medicine

## 2017-11-17 ENCOUNTER — Other Ambulatory Visit: Payer: Self-pay | Admitting: Family Medicine

## 2017-11-17 MED ORDER — TRIAMCINOLONE ACETONIDE 0.1 % EX CREA: 1.0000 "application " | TOPICAL_CREAM | Freq: Two times a day (BID) | CUTANEOUS | 1 refills | Status: DC

## 2017-11-24 ENCOUNTER — Ambulatory Visit: Payer: Medicare Other | Admitting: Internal Medicine

## 2017-12-15 ENCOUNTER — Other Ambulatory Visit: Payer: Self-pay | Admitting: Family Medicine

## 2017-12-15 NOTE — Telephone Encounter (Signed)
Pt request refill for ibuprofen; d/c'd by Dr Pamella Pert 09/25/17; will route to office for provider review.

## 2017-12-25 ENCOUNTER — Other Ambulatory Visit: Payer: Self-pay

## 2017-12-30 NOTE — Telephone Encounter (Signed)
Called pt to try and get her set up with an appt with Dr. Pamella Pert for med refills. If pt calls back, please schedule her at her convenience.   Thanks!

## 2018-01-08 ENCOUNTER — Other Ambulatory Visit: Payer: Self-pay

## 2018-01-08 ENCOUNTER — Ambulatory Visit (INDEPENDENT_AMBULATORY_CARE_PROVIDER_SITE_OTHER): Payer: Medicare Other

## 2018-01-08 ENCOUNTER — Ambulatory Visit: Payer: Medicare Other | Admitting: Family Medicine

## 2018-01-08 ENCOUNTER — Ambulatory Visit (INDEPENDENT_AMBULATORY_CARE_PROVIDER_SITE_OTHER): Payer: Medicare Other | Admitting: Family Medicine

## 2018-01-08 ENCOUNTER — Encounter: Payer: Self-pay | Admitting: Family Medicine

## 2018-01-08 VITALS — BP 150/94 | HR 98 | Temp 97.7°F | Ht 63.5 in | Wt 166.4 lb

## 2018-01-08 DIAGNOSIS — M542 Cervicalgia: Secondary | ICD-10-CM | POA: Diagnosis not present

## 2018-01-08 DIAGNOSIS — M791 Myalgia, unspecified site: Secondary | ICD-10-CM

## 2018-01-08 DIAGNOSIS — R202 Paresthesia of skin: Secondary | ICD-10-CM

## 2018-01-08 DIAGNOSIS — M130 Polyarthritis, unspecified: Secondary | ICD-10-CM | POA: Diagnosis not present

## 2018-01-08 DIAGNOSIS — E785 Hyperlipidemia, unspecified: Secondary | ICD-10-CM

## 2018-01-08 IMAGING — DX DG CERVICAL SPINE COMPLETE 4+V
5 series · 5 of 5 positions shown · non-contrast
Comparison: None.

CLINICAL DATA: Neck pain with left greater than right upper
extremity numbness and tingling. No acute injury.

EXAM:
CERVICAL SPINE - COMPLETE 4+ VIEW

[c-spine lat]
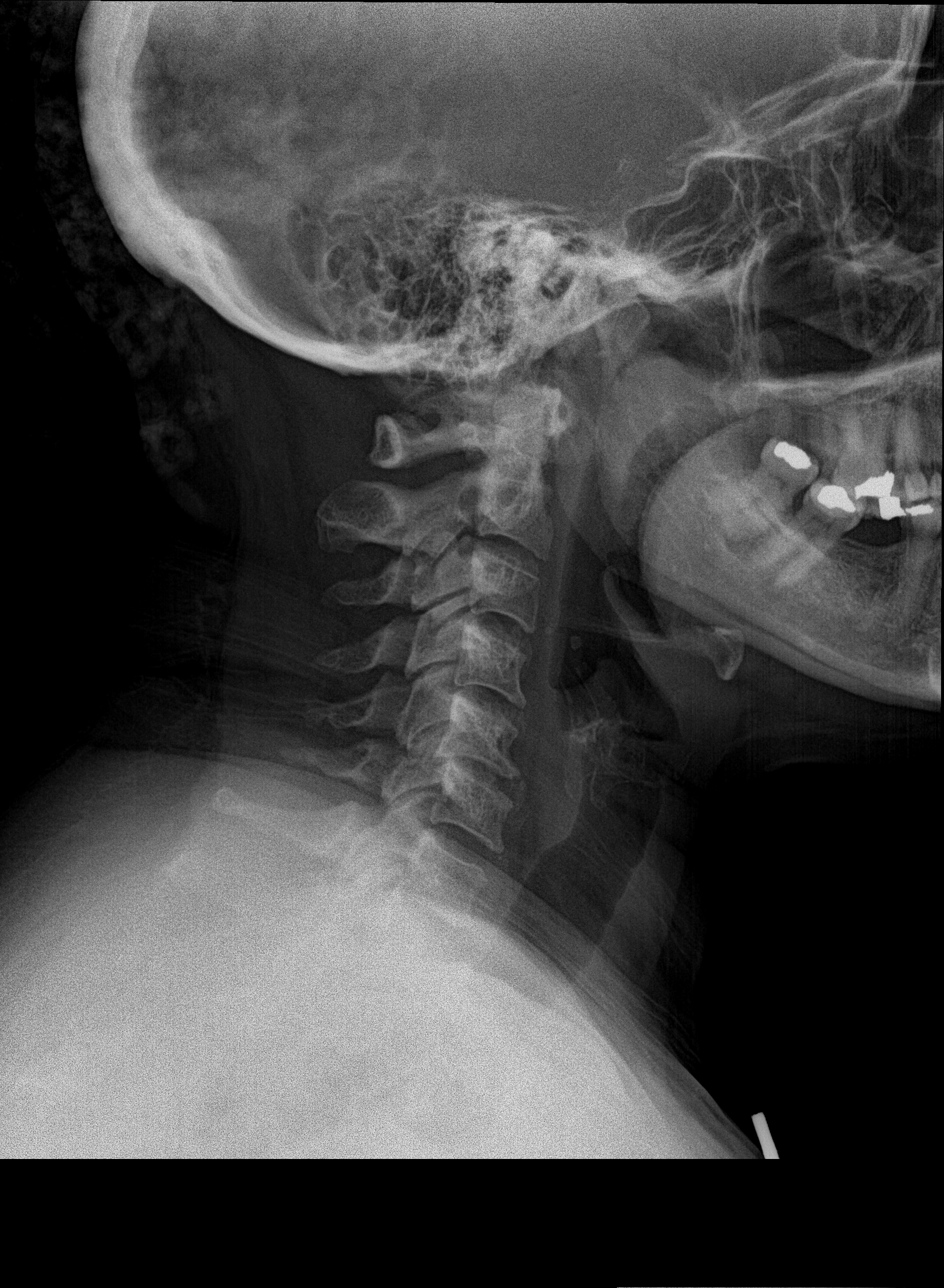

[c-spine obl (1 of 2)]
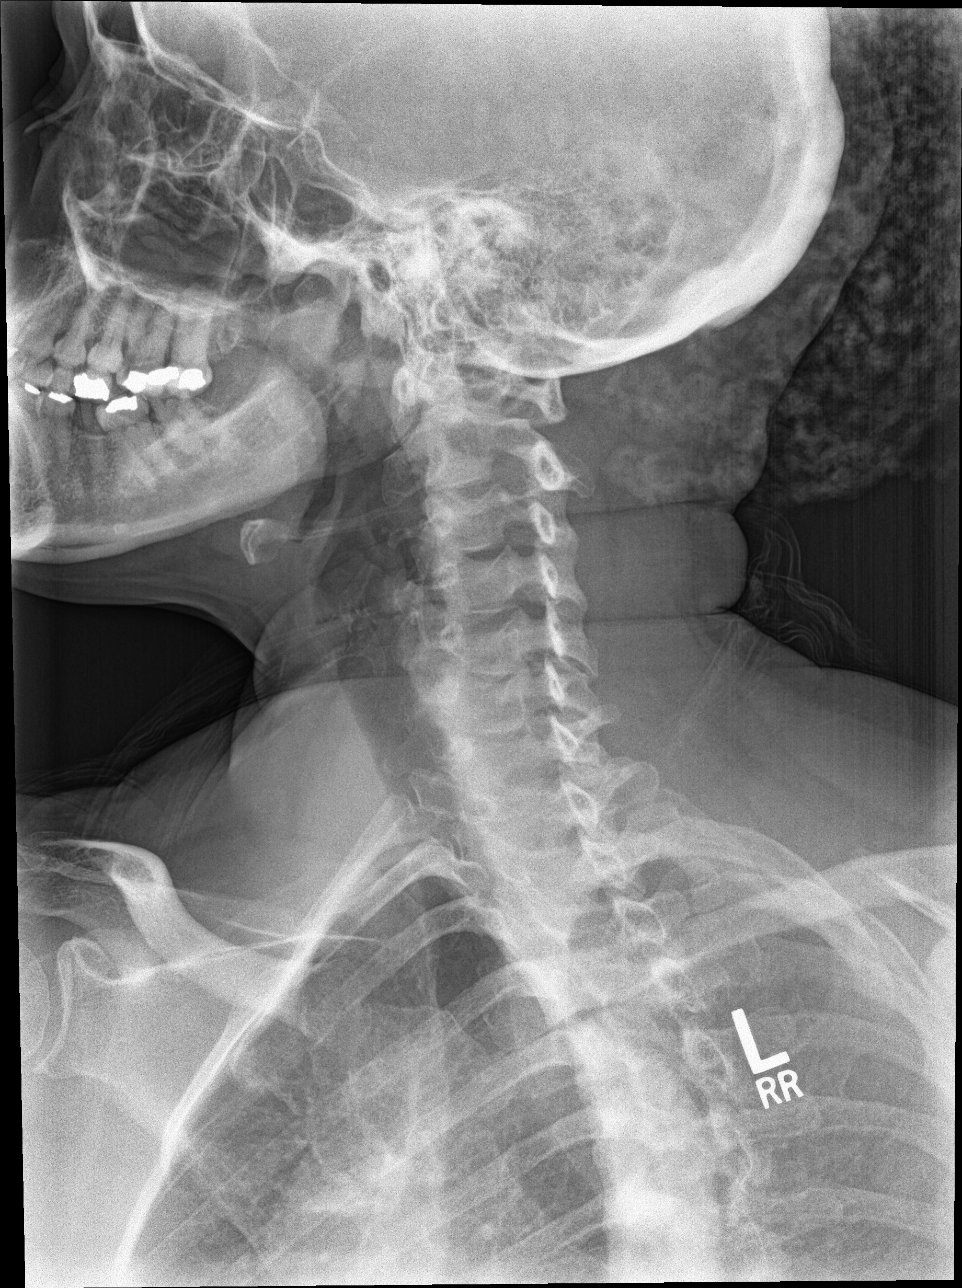

[c-spine obl (2 of 2)]
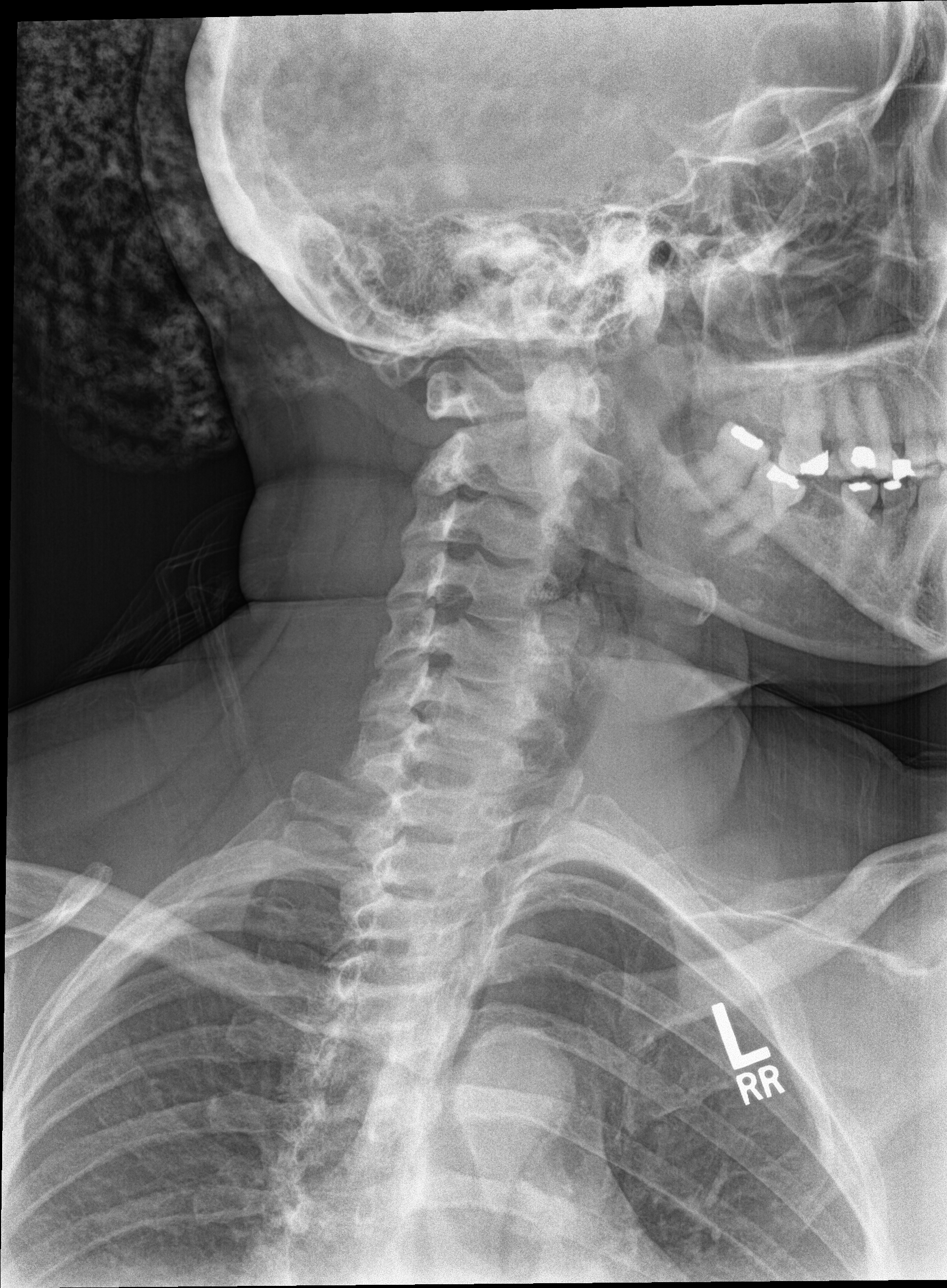

[c-spine ap]
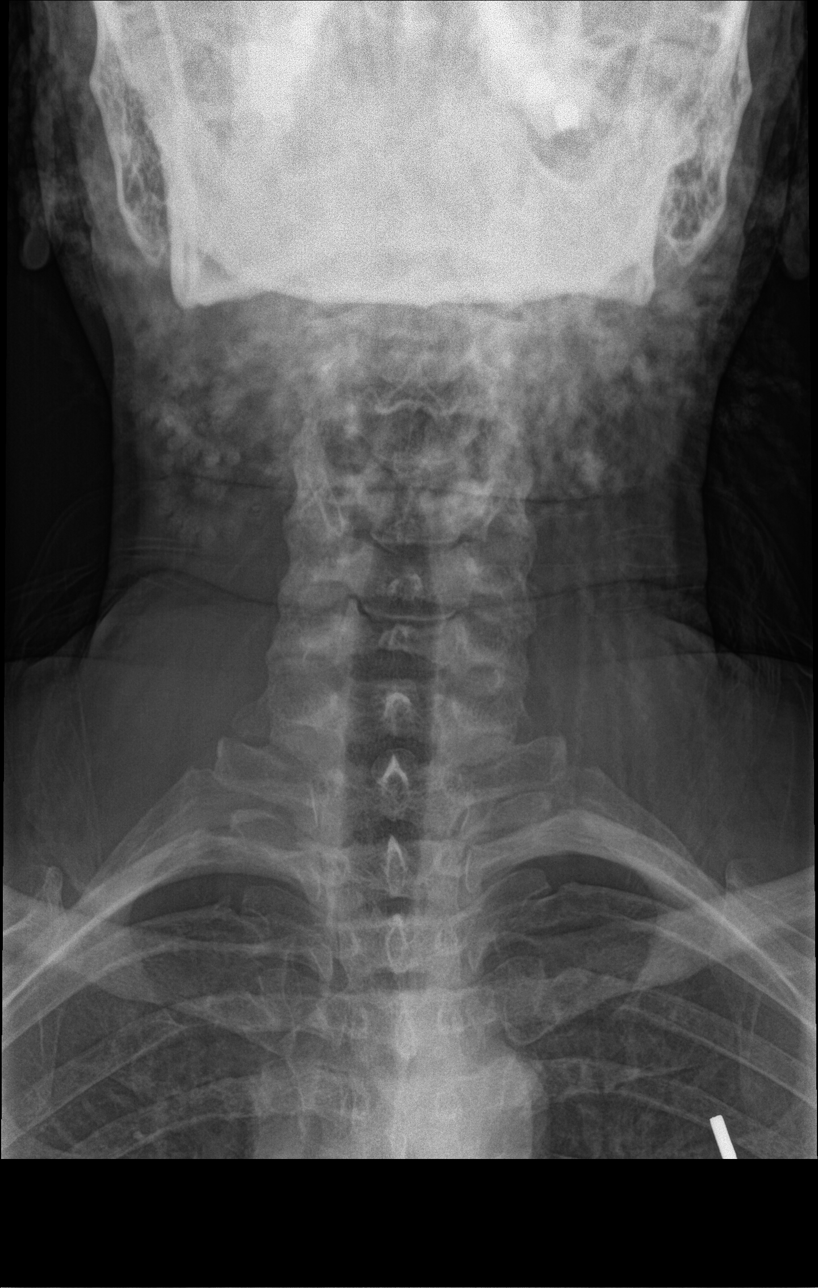

[c-spine open mouth]
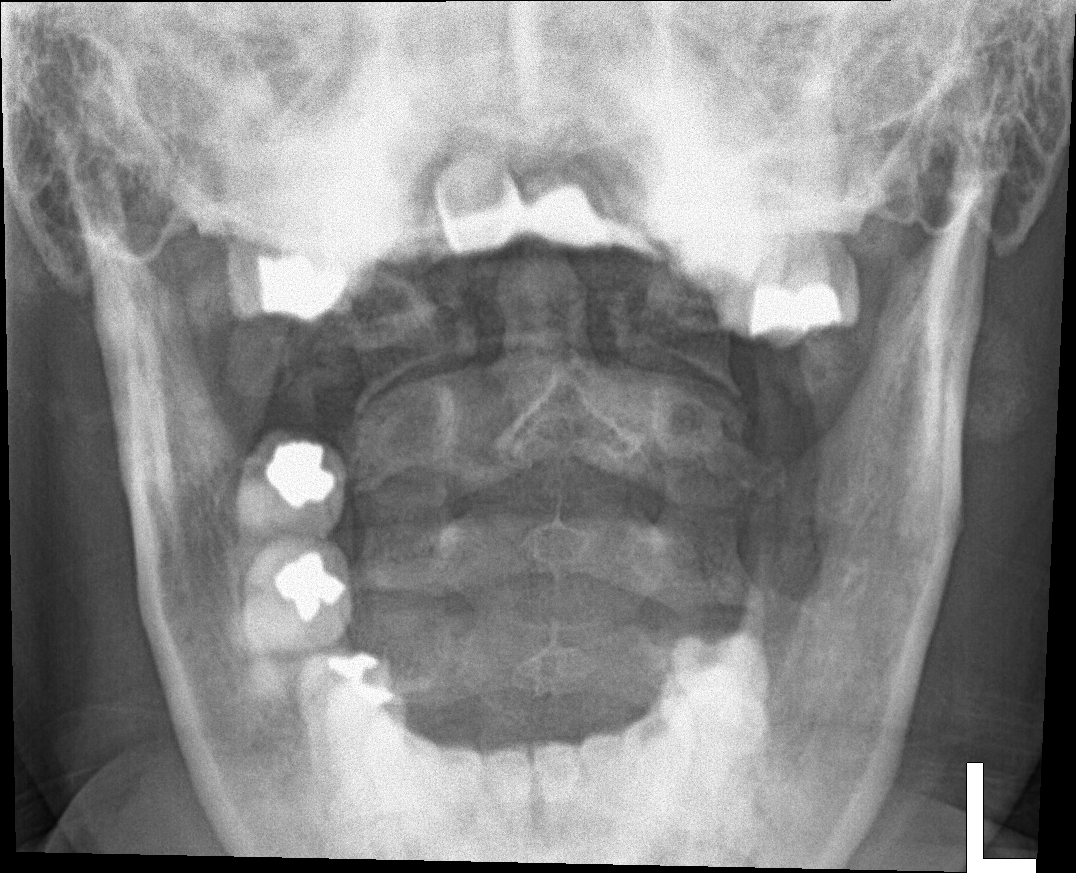

[5 of 5 positions shown; findings below may reference images not displayed]

FINDINGS: There is some artifact from the patient's hair overlapping the spine
on the AP views. The prevertebral soft tissues are normal. There is
a slight degenerative retrolisthesis at C5-6. The alignment is
otherwise normal. There is no evidence of acute fracture or
traumatic subluxation. The C1-2 articulation appears normal in the
AP projection. There is mild disc space narrowing and uncinate
spurring at C5-6. Oblique views demonstrate mild osseous foraminal
narrowing at C5-6.
IMPRESSION: Mild spondylosis at C5-6 with associated retrolisthesis and mild
foraminal narrowing bilaterally. No acute osseous findings.

## 2018-01-08 MED ORDER — TRIAMCINOLONE ACETONIDE 0.1 % EX CREA: 1.0000 "application " | TOPICAL_CREAM | Freq: Two times a day (BID) | CUTANEOUS | 1 refills | Status: DC

## 2018-01-08 MED ORDER — GABAPENTIN 300 MG PO CAPS: 300.0000 mg | ORAL_CAPSULE | Freq: Three times a day (TID) | ORAL | 3 refills | Status: DC

## 2018-01-08 NOTE — Patient Instructions (Addendum)
1. Stop atorvastatin for now and lets see if muscle pain is better, please call me in about 2 weeks to see how you are doing 2. I have made a referral to orthopedics surgeon to further evaluate if your symptoms are due to changes in your cervical spine    IF you received an x-ray today, you will receive an invoice from [facility]. Please contact [facility] at [phone] with questions or concerns regarding your invoice.   IF you received labwork today, you will receive an invoice from [facility]. Please contact [facility] at [phone] with questions or concerns regarding your invoice.   Our billing staff will not be able to assist you with questions regarding bills from these companies.  You will be contacted with the lab results as soon as they are available. The fastest way to get your results is to activate your My Chart account. Instructions are located on the last page of this paperwork. If you have not heard from [place] regarding the results in 2 weeks, please contact this office.

## 2018-01-08 NOTE — Progress Notes (Signed)
5/3/20193:23 PM  Francess Goodrich-Hines 1960-09-24, 57 y.o. female [id number]  Chief Complaint  Patient presents with  . Generalized Body Aches    for the past 2 wks, headaches are starting to return    HPI:   Patient is a 57 y.o. female with past medical history significant for UC, HLP, DJD of c-spine who presents today for 2 weeks of myalgias  She describes muscle pain and stiffness, all over Recently started atorvastatin for HLP and wondering is this is the cause She has been taking intermittently for past several days She endorses joint pain, wrists, shoulders, ankles. Denies any joint swelling, redness She also has been having worsening neck pain with occ headaches, numbness and tingling or hands and feet. She reports headaches are occipital.  Denies vision changes, nausea, vomiting, photophobia Denies any changes to bowel or bladder function, dark urine, fever or chills Denies any CP, SOB but reports waking up at times feeling SOB  Fall Risk  01/08/2018 10/06/2017 09/25/2017 09/21/2017 08/04/2017  Falls in the past year? No No No No Exclusion - non ambulatory  Number falls in past yr: - - - - 1  Injury with Fall? - - - - Yes     Depression screen [facility] 2/9 01/08/2018 10/06/2017 09/25/2017  Decreased Interest 0 0 0  Down, Depressed, Hopeless 0 0 0  PHQ - 2 Score 0 0 0    Allergies  Allergen Reactions  . Codeine Itching, Rash and Hives  . Latex Hives    With the power    Prior to Admission medications   Medication Sig Start Date End Date Taking? Authorizing Provider  atorvastatin (LIPITOR) 40 MG tablet Take 1 tablet (40 mg total) by mouth daily. 08/25/17  Yes Rutherford Guys, MD  mesalamine (LIALDA) 1.2 g EC tablet Take 4.8 g daily with breakfast by mouth.   Yes [provider]  triamcinolone cream (KENALOG) 0.1 % Apply 1 application topically 2 (two) times daily. 11/17/17  Yes Rutherford Guys, MD  amoxicillin (AMOXIL) 500 MG capsule Take 500 mg by mouth 3 (three)  times daily.    [provider]  hydrOXYzine (ATARAX/VISTARIL) 10 MG tablet Take 10 mg as needed by mouth.    [provider]    Past Medical History:  Diagnosis Date  . Anemia   . Carpal tunnel syndrome    bilateral  . Chronic heel pain   . DJD (degenerative joint disease) of cervical spine    MRI 2017  . Plantar fasciitis    bilateral  . UC (ulcerative colitis) ([place])     Past Surgical History:  Procedure Laterality Date  . ABDOMINAL HYSTERECTOMY    . APPENDECTOMY    . CESAREAN SECTION     x4  . COLONOSCOPY     [place]  . ESOPHAGOGASTRODUODENOSCOPY      Social History   Tobacco Use  . Smoking status: Former Smoker    Last attempt to quit: 10/13/2006    Years since quitting: 11.2  . Smokeless tobacco: Never Used  Substance Use Topics  . Alcohol use: Yes    Comment: occasional    Family History  Problem Relation Age of Onset  . Stroke Mother   . Diabetes Mother   . Hypertension Mother   . Lung cancer Mother   . Sickle cell trait Mother   . Breast cancer Maternal Aunt   . Diabetes Maternal Aunt   . Lung cancer Maternal Aunt   .  Lung cancer Maternal Aunt   . Lung cancer Maternal Aunt   . Colon cancer Cousin 39  . Sickle cell trait Father   . Sickle cell anemia Sister 49  . Lung cancer Maternal Grandmother   . Sickle cell anemia Sister   . Other Sister        accident and blood clot formed    ROS Per hpi  OBJECTIVE:  Blood pressure (!) 150/94, pulse 98, temperature 97.7 F (36.5 C), temperature source Oral, height 5' 3.5" (1.613 m), weight 166 lb 6.4 oz (75.5 kg), SpO2 98 %.    Physical Exam  Constitutional: She is oriented to person, place, and time. She appears well-developed and well-nourished.  HENT:  Head: Normocephalic and atraumatic.  Mouth/Throat: Oropharynx is clear and moist. No oropharyngeal exudate.  Eyes: Pupils are equal, round, and reactive to light. EOM are normal. No scleral icterus.  Neck: Neck  supple. Spinous process tenderness and muscular tenderness present.  Cardiovascular: Normal rate, regular rhythm and normal heart sounds. Exam reveals no gallop and no friction rub.  No murmur heard. Pulmonary/Chest: Effort normal and breath sounds normal. She has no wheezes. She has no rales.  Musculoskeletal: She exhibits no edema.  Joints without deformities, swelling or warmth  Neurological: She is alert and oriented to person, place, and time. She has normal reflexes.  Neg phalen and tinel bilaterally  Skin: Skin is warm and dry.  Nursing note and vitals reviewed.   Dg Cervical Spine Complete  Result Date: 01/08/2018 CLINICAL DATA:  Neck pain with left greater than right upper extremity numbness and tingling. No acute injury. EXAM: CERVICAL SPINE - COMPLETE 4+ VIEW COMPARISON:  None. FINDINGS: There is some artifact from the patient's hair overlapping the spine on the AP views. The prevertebral soft tissues are normal. There is a slight degenerative retrolisthesis at C5-6. The alignment is otherwise normal. There is no evidence of acute fracture or traumatic subluxation. The C1-2 articulation appears normal in the AP projection. There is mild disc space narrowing and uncinate spurring at C5-6. Oblique views demonstrate mild osseous foraminal narrowing at C5-6. IMPRESSION: Mild spondylosis at C5-6 with associated retrolisthesis and mild foraminal narrowing bilaterally. No acute osseous findings. Electronically Signed   By: Richardean Sale M.D.   On: 01/08/2018 16:39     ASSESSMENT and PLAN  1. Myalgia Checking CK, dc atorvastatin, evaluate if sx resolve - CK, TOTAL(REFL) - CK; Future  2. Paresthesias 3. Neck pain Patient with known carpal tunnel per EMG, but neg provocative tests today. c spine with involvement of C5-6. Starting gabapentin, discussed r/se/b and titration, referring back to ortho.  - Ambulatory referral to Orthopedic Surgery - DG Cervical Spine Complete; Future  4.  Polyarthritis Exam benign today. Unable to do NSAIDs as patient is on mesalamine. Discussed APAP use. UC aw with polyarthritis. Consider rheum eval.  - Rheumatoid Arthritis Diagnostic Panel - ANA,IFA Sjogrens' Pnl rflx Tit/Patn - Sedimentation rate - C-reactive protein - Uric acid  5. Hyperlipidemia, unspecified hyperlipidemia type Rechecking lipids today. DC atorvastatin due to myalgia. Consider restarting atorvastatin 33m  - COMPLETE METABOLIC PANEL WITH GFR - Lipid Panel w/reflex Direct LDL  Other orders - gabapentin (NEURONTIN) 300 MG capsule; Take 1 capsule (300 mg total) by mouth 3 (three) times daily. - triamcinolone cream (KENALOG) 0.1 %; Apply 1 application topically 2 (two) times daily. - EXTRA LAV TOP TUBE - Anti-nuclear ab-titer (ANA titer)  Return for after orthopedics workup.    IRutherford Guys MD  Primary Care at [place], [place] [address] Ph.  [phone] Fax [phone]

## 2018-01-12 LAB — URIC ACID: Uric Acid, Serum: 4.4 mg/dL (ref 2.5–7.0)

## 2018-01-12 LAB — C-REACTIVE PROTEIN: CRP: 5.9 mg/L (ref <8.0)

## 2018-01-12 LAB — SEDIMENTATION RATE: Sed Rate: 17 mm/h (ref 0–30)

## 2018-01-13 LAB — RHEUMATOID ARTHRITIS DIAGNOSTIC PANEL
Cyclic Citrullin Peptide Ab: <16 UNITS
Rhuematoid fact SerPl-aCnc: <14 IU/mL (ref <14)

## 2018-01-13 LAB — LIPID PANEL W/REFLEX DIRECT LDL
Cholesterol: 223 mg/dL — ABNORMAL HIGH (ref <200)
HDL: 35 mg/dL — ABNORMAL LOW (ref >50)
LDL Cholesterol (Calc): 151 mg/dL (calc) — ABNORMAL HIGH
Non-HDL Cholesterol (Calc): 188 mg/dL (calc) — ABNORMAL HIGH (ref <130)
Total CHOL/HDL Ratio: 6.4 (calc) — ABNORMAL HIGH (ref <5.0)
Triglycerides: 229 mg/dL — ABNORMAL HIGH (ref <150)

## 2018-01-13 LAB — ANA,IFA SJOGRENS' PNL RFLX TIT/PATN
Anti Nuclear Antibody(ANA): POSITIVE — AB
Rhuematoid fact SerPl-aCnc: <14 IU/mL (ref <14)
SSA (Ro) (ENA) Antibody, IgG: <1 AI
SSB (La) (ENA) Antibody, IgG: <1 AI

## 2018-01-13 LAB — EXTRA LAV TOP TUBE

## 2018-01-13 LAB — ANTI-NUCLEAR AB-TITER (ANA TITER): ANA Titer 1: 1:40 {titer} — ABNORMAL HIGH

## 2018-01-13 LAB — CK, TOTAL(REFL): Total CK: 249 U/L — ABNORMAL HIGH (ref 29–143)

## 2018-01-16 ENCOUNTER — Encounter: Payer: Self-pay | Admitting: Family Medicine

## 2018-01-18 ENCOUNTER — Telehealth: Payer: Self-pay | Admitting: *Deleted

## 2018-01-18 NOTE — Telephone Encounter (Signed)
Patient has scheduled her appointment.  She will come in for her labs 3 days before visit, patient stated she wanted to be sure they are put in computer .

## 2018-01-20 ENCOUNTER — Encounter: Payer: Self-pay | Admitting: Physician Assistant

## 2018-01-20 ENCOUNTER — Ambulatory Visit (INDEPENDENT_AMBULATORY_CARE_PROVIDER_SITE_OTHER): Payer: Medicare Other | Admitting: Physician Assistant

## 2018-01-20 ENCOUNTER — Other Ambulatory Visit: Payer: Self-pay

## 2018-01-20 VITALS — BP 134/95 | HR 74 | Temp 98.3°F | Resp 16 | Ht 63.5 in | Wt 165.0 lb

## 2018-01-20 DIAGNOSIS — J302 Other seasonal allergic rhinitis: Secondary | ICD-10-CM

## 2018-01-20 MED ORDER — METHYLPREDNISOLONE ACETATE 80 MG/ML IJ SUSP
80.0000 mg | Freq: Once | INTRAMUSCULAR | Status: AC
  Administered 2018-01-20: 80 mg via INTRAMUSCULAR

## 2018-01-20 MED ORDER — CETIRIZINE-PSEUDOEPHEDRINE ER 5-120 MG PO TB12: 1.0000 | ORAL_TABLET | Freq: Two times a day (BID) | ORAL | 0 refills | Status: AC

## 2018-01-20 MED ORDER — OLOPATADINE HCL 0.2 % OP SOLN: 1.0000 [drp] | Freq: Every day | OPHTHALMIC | 0 refills | Status: DC

## 2018-01-20 NOTE — Progress Notes (Signed)
01/20/2018 11:52 AM   DOB: 01/04/1961 / MRN: [id number]  SUBJECTIVE:  Beverly Ramirez is a 57 y.o. female presenting for itching, itchy mouth, throat, ear itching.  Patient associates sneezing.  States this started 5 days ago and is worsening.  She is tried Benadryl and Claritin both of which help somewhat however she continues to have severe symptoms.  She tells me "I want to call my eyes out they itch so bad."  She denies any acute changes in vision.  She is allergic to codeine and latex.   She  has a past medical history of Anemia, Carpal tunnel syndrome, Chronic heel pain, DJD (degenerative joint disease) of cervical spine, Plantar fasciitis, and UC (ulcerative colitis) ([place]).    She  reports that she quit smoking about 11 years ago. She has never used smokeless tobacco. She reports that she drinks alcohol. She reports that she does not use drugs. She  reports that she currently engages in sexual activity and has had partners who are Female. The patient  has a past surgical history that includes Appendectomy; Abdominal hysterectomy; Cesarean section; Colonoscopy; and Esophagogastroduodenoscopy.  Her family history includes Breast cancer in her maternal aunt; Colon cancer (age of onset: 29) in her cousin; Diabetes in her maternal aunt and mother; Hypertension in her mother; Lung cancer in her maternal aunt, maternal aunt, maternal aunt, maternal grandmother, and mother; Other in her sister; Sickle cell anemia in her sister; Sickle cell anemia (age of onset: 61) in her sister; Sickle cell trait in her father and mother; Stroke in her mother.  ROS per HPI  The problem list and medications were reviewed and updated by myself where necessary and exist elsewhere in the encounter.   OBJECTIVE:  BP (!) 134/95 (BP Location: Right Arm, Patient Position: Sitting, Cuff Size: Normal)   Pulse 74   Temp 98.3 F (36.8 C) (Oral)   Resp 16   Ht 5' 3.5" (1.613 m)   Wt 165 lb (74.8 kg)   SpO2 96%    BMI 28.77 kg/m   CMP Latest Ref Rng & Units 09/23/2017 09/21/2017 08/04/2017  Glucose 70 - 140 mg/dL 93 123(H) 109(H)  BUN 7 - 26 mg/dL 10 9 12   Creatinine 0.60 - 1.10 mg/dL 0.78 0.75 0.89  Sodium 136 - 145 mmol/L 140 140 143  Potassium 3.3 - 4.7 mmol/L 3.6 3.8 4.0  Chloride 98 - 109 mmol/L 105 100 102  CO2 22 - 29 mmol/L 28 27 26   Calcium 8.4 - 10.4 mg/dL 9.4 9.5 9.7  Total Protein 6.4 - 8.3 g/dL 7.2 6.8 7.1  Total Bilirubin 0.2 - 1.2 mg/dL 0.5 0.2 0.3  Alkaline Phos 40 - 150 U/L 94 88 102  AST 5 - 34 U/L 17 16 22   ALT 0 - 55 U/L 23 23 37(H)     Physical Exam  Constitutional: She is oriented to person, place, and time. She appears well-nourished. No distress.  HENT:  Head: Normocephalic and atraumatic.  Right Ear: Hearing and tympanic membrane normal.  Left Ear: Hearing and tympanic membrane normal.  Nose: Mucosal edema present.  Mouth/Throat: Uvula is midline, oropharynx is clear and moist and mucous membranes are normal.  Eyes: Pupils are equal, round, and reactive to light. EOM are normal.  Bilateral mild conjunctival injection.  Vision 20/30 bilaterally.  No frank eye pain on exam.  No sensitivity to light.  Extraocular motions intact  Cardiovascular: Normal rate.  Pulmonary/Chest: Effort normal.  Abdominal: She exhibits no distension.  Neurological: She is alert and oriented to person, place, and time. No cranial nerve deficit. Gait normal.  Skin: Skin is dry. She is not diaphoretic.  Psychiatric: She has a normal mood and affect.  Vitals reviewed.   No results found for this or any previous visit (from the past 72 hour(s)).  No results found.  ASSESSMENT AND PLAN:  Beverly Ramirez was seen today for eye problem and sinus problem.  Diagnoses and all orders for this visit:  Seasonal allergies: She appears to be miserable today.  IM steroid with stronger H1 blockade and eye drop.  RTC as needed.  -     methylPREDNISolone acetate (DEPO-MEDROL) injection 80 mg -      Olopatadine HCl 0.2 % SOLN; Apply 1 drop to eye daily. -     cetirizine-pseudoephedrine (ZYRTEC-D) 5-120 MG tablet; Take 1 tablet by mouth 2 (two) times daily for 7 days.    The patient is advised to call or return to clinic if she does not see an improvement in symptoms, or to seek the care of the closest emergency department if she worsens with the above plan.   Philis Fendt, MHS, PA-C [facility] at [facility] 01/20/2018 11:52 AM

## 2018-01-20 NOTE — Telephone Encounter (Signed)
CK ordered.

## 2018-01-20 NOTE — Patient Instructions (Signed)
Take meds as prescribed. Come back as needed.

## 2018-01-29 DIAGNOSIS — M542 Cervicalgia: Secondary | ICD-10-CM | POA: Diagnosis not present

## 2018-01-29 DIAGNOSIS — G5603 Carpal tunnel syndrome, bilateral upper limbs: Secondary | ICD-10-CM | POA: Diagnosis not present

## 2018-02-04 ENCOUNTER — Encounter: Payer: Self-pay | Admitting: Family Medicine

## 2018-02-04 ENCOUNTER — Ambulatory Visit: Payer: Medicare Other | Admitting: Family Medicine

## 2018-02-04 ENCOUNTER — Ambulatory Visit (INDEPENDENT_AMBULATORY_CARE_PROVIDER_SITE_OTHER): Payer: Medicare Other | Admitting: Family Medicine

## 2018-02-04 VITALS — BP 120/78 | HR 78 | Temp 98.8°F | Resp 16 | Ht 63.0 in | Wt 165.2 lb

## 2018-02-04 DIAGNOSIS — R748 Abnormal levels of other serum enzymes: Secondary | ICD-10-CM

## 2018-02-04 DIAGNOSIS — R21 Rash and other nonspecific skin eruption: Secondary | ICD-10-CM | POA: Diagnosis not present

## 2018-02-04 DIAGNOSIS — E785 Hyperlipidemia, unspecified: Secondary | ICD-10-CM | POA: Diagnosis not present

## 2018-02-04 DIAGNOSIS — G5603 Carpal tunnel syndrome, bilateral upper limbs: Secondary | ICD-10-CM

## 2018-02-04 DIAGNOSIS — L03113 Cellulitis of right upper limb: Secondary | ICD-10-CM | POA: Diagnosis not present

## 2018-02-04 DIAGNOSIS — M791 Myalgia, unspecified site: Secondary | ICD-10-CM

## 2018-02-04 LAB — POCT SKIN KOH: Skin KOH, POC: NEGATIVE

## 2018-02-04 MED ORDER — EZETIMIBE 10 MG PO TABS: 10.0000 mg | ORAL_TABLET | Freq: Every day | ORAL | 1 refills | Status: DC

## 2018-02-04 MED ORDER — CEPHALEXIN 500 MG PO CAPS: 500.0000 mg | ORAL_CAPSULE | Freq: Four times a day (QID) | ORAL | 0 refills | Status: AC

## 2018-02-04 NOTE — Progress Notes (Signed)
5/30/20191:48 PM  Beverly Ramirez 11-22-1960, 57 y.o. female [id number]  Chief Complaint  Patient presents with  . myalgia    HPI:   Patient is a 57 y.o. female with past medical history significant for UC, HLP who presents today for followup  Stopped statin as instructed given elevated CK. Has not had any myalgias since then Saw ortho on the 24th, referred to PT for carpal tunnel, gabapentin helping Otherwise having a itchy burning rash on back of right arm, warm to touch, tender, triamcinolone was helping at first Denies any new exposures  Fall Risk  01/20/2018 01/08/2018 10/06/2017 09/25/2017 09/21/2017  Falls in the past year? No No No No No  Number falls in past yr: - - - - -  Injury with Fall? - - - - -     Depression screen [facility] 2/9 02/04/2018 01/20/2018 01/08/2018  Decreased Interest 0 0 0  Down, Depressed, Hopeless 0 0 0  PHQ - 2 Score 0 0 0    Allergies  Allergen Reactions  . Codeine Itching, Rash and Hives  . Latex Hives    With the power    Prior to Admission medications   Medication Sig Start Date End Date Taking? Authorizing Provider  gabapentin (NEURONTIN) 300 MG capsule Take 1 capsule (300 mg total) by mouth 3 (three) times daily. 01/08/18  Yes Rutherford Guys, MD  mesalamine (LIALDA) 1.2 g EC tablet Take 4.8 g daily with breakfast by mouth.   Yes [provider]  Olopatadine HCl 0.2 % SOLN Apply 1 drop to eye daily. 01/20/18  Yes Tereasa Coop, PA-C  triamcinolone cream (KENALOG) 0.1 % Apply 1 application topically 2 (two) times daily. 01/08/18  Yes Rutherford Guys, MD    Past Medical History:  Diagnosis Date  . Anemia   . Carpal tunnel syndrome    bilateral  . Chronic heel pain   . DJD (degenerative joint disease) of cervical spine    MRI 2017  . Plantar fasciitis    bilateral  . UC (ulcerative colitis) ([place])     Past Surgical History:  Procedure Laterality Date  . ABDOMINAL HYSTERECTOMY    . APPENDECTOMY    . CESAREAN SECTION     x4  . COLONOSCOPY     Multiple in [place]  . ESOPHAGOGASTRODUODENOSCOPY      Social History   Tobacco Use  . Smoking status: Former Smoker    Last attempt to quit: 10/13/2006    Years since quitting: 11.3  . Smokeless tobacco: Never Used  Substance Use Topics  . Alcohol use: Yes    Comment: occasional    Family History  Problem Relation Age of Onset  . Stroke Mother   . Diabetes Mother   . Hypertension Mother   . Lung cancer Mother   . Sickle cell trait Mother   . Breast cancer Maternal Aunt   . Diabetes Maternal Aunt   . Lung cancer Maternal Aunt   . Lung cancer Maternal Aunt   . Lung cancer Maternal Aunt   . Colon cancer Cousin 29  . Sickle cell trait Father   . Sickle cell anemia Sister 22  . Lung cancer Maternal Grandmother   . Sickle cell anemia Sister   . Other Sister        accident and blood clot formed    ROS Per hpi Neg fever, chills  OBJECTIVE:  Blood pressure 120/78, pulse 78, temperature 98.8 F (37.1 C), temperature source Oral, resp.  rate 16, height 5' 3"  (1.6 m), weight 165 lb 3.2 oz (74.9 kg), SpO2 98 %.  Physical Exam  Constitutional: She is oriented to person, place, and time. She appears well-developed and well-nourished.  HENT:  Head: Normocephalic and atraumatic.  Mouth/Throat: Mucous membranes are normal.  Eyes: Pupils are equal, round, and reactive to light. EOM are normal. No scleral icterus.  Neck: Neck supple.  Pulmonary/Chest: Effort normal.  Neurological: She is alert and oriented to person, place, and time.  Skin: Skin is warm and dry. Rash: near axilla a hyperpigemented slighty scaly pacth. There is erythema (right upper arm, with a large erythematous warm tender area. ).  Psychiatric: She has a normal mood and affect.  Nursing note and vitals reviewed.     Results for orders placed or performed in visit on 02/04/18 (from the past 24 hour(s))  POCT Skin KOH     Status: None   Collection Time: 02/04/18  3:05 PM  Result  Value Ref Range   Skin KOH, POC Negative Negative      ASSESSMENT and PLAN  1. Myalgia 2. Elevated CK  Myalgias resolved with dc of statin, rechecking CK level today. Will cont to monitor to see if myalgias return or not given mild CK elevation - CK  3. Hyperlipidemia, unspecified hyperlipidemia type See above, starting zetia, new med reviewed - ezetimibe (ZETIA) 10 MG tablet; Take 1 tablet (10 mg total) by mouth daily.  4. Bilateral carpal tunnel syndrome Per ortho starting PT, cont with gabapentin  5. Rash and nonspecific skin eruption - POCT Skin KOH - neg  6. Cellulitis of right upper extremity Starting abx, RTC precautions given - cephALEXin (KEFLEX) 500 MG capsule; Take 1 capsule (500 mg total) by mouth 4 (four) times daily for 7 days.  Return in about 2 weeks (around 02/18/2018).    Rutherford Guys, MD Primary Care at [facility] [place], [place] [address] Ph.  [phone] Fax [phone]

## 2018-02-04 NOTE — Patient Instructions (Signed)
     IF you received an x-ray today, you will receive an invoice from [facility]. Please contact [facility] at [phone] with questions or concerns regarding your invoice.   IF you received labwork today, you will receive an invoice from [facility]. Please contact [facility] at [phone] with questions or concerns regarding your invoice.   Our billing staff will not be able to assist you with questions regarding bills from these companies.  You will be contacted with the lab results as soon as they are available. The fastest way to get your results is to activate your My Chart account. Instructions are located on the last page of this paperwork. If you have not heard from us regarding the results in 2 weeks, please contact this office.     

## 2018-02-05 ENCOUNTER — Other Ambulatory Visit: Payer: Self-pay | Admitting: Family Medicine

## 2018-02-05 LAB — CK: Total CK: 287 U/L — ABNORMAL HIGH (ref 24–173)

## 2018-02-08 NOTE — Telephone Encounter (Signed)
Already written for 90 day prescription gabapentin refill Last Refill:01/08/18 # 90 3 RF Last OV: 02/04/18 PCP: Grant Fontana MD Pharmacy:[facility]

## 2018-02-27 ENCOUNTER — Other Ambulatory Visit: Payer: Self-pay | Admitting: Family Medicine

## 2018-03-01 NOTE — Telephone Encounter (Signed)
Patient called and spoke to her about Atorvastatin, she says she is no longer taking that medication it was switched to Zetia.

## 2018-03-08 ENCOUNTER — Ambulatory Visit: Payer: Self-pay

## 2018-03-08 NOTE — Telephone Encounter (Signed)
Pt. Reports she had vomiting this past weekend - Fri.,Sat., and Sunday. Yesterday started having dizziness - "worse when I lay down.Feels like the room is spinning." Denies any fever or vomiting today. Reports is eating and drinking well today. No signs of dehydration.Denies any chest pain or shortness of breath. Appointment offered for today - request tomorrow. Appointment made.Instructed of symptoms worsen to go to ED. Verbalizes understanding. Reason for Disposition . [1] MODERATE dizziness (e.g., interferes with normal activities) AND [2] has NOT been evaluated by physician for this  (Exception: dizziness caused by heat exposure, sudden standing, or poor fluid intake)  Answer Assessment - Initial Assessment Questions 1. DESCRIPTION: "Describe your dizziness."     Dizzy 2. LIGHTHEADED: "Do you feel lightheaded?" (e.g., somewhat faint, woozy, weak upon standing)     Lightheaded 3. VERTIGO: "Do you feel like either you or the room is spinning or tilting?" (i.e. vertigo)     Yes 4. SEVERITY: "How bad is it?"  "Do you feel like you are going to faint?" "Can you stand and walk?"   - MILD - walking normally   - MODERATE - interferes with normal activities (e.g., work, school)    - SEVERE - unable to stand, requires support to walk, feels like passing out now.      Mild 5. ONSET:  "When did the dizziness begin?"     2 days ago 6. AGGRAVATING FACTORS: "Does anything make it worse?" (e.g., standing, change in head position)     Laying down 7. HEART RATE: "Can you tell me your heart rate?" "How many beats in 15 seconds?"  (Note: not all patients can do this)       No 8. CAUSE: "What do you think is causing the dizziness?"     Unsure 9. RECURRENT SYMPTOM: "Have you had dizziness before?" If so, ask: "When was the last time?" "What happened that time?"     Yes 10. OTHER SYMPTOMS: "Do you have any other symptoms?" (e.g., fever, chest pain, vomiting, diarrhea, bleeding)       Had vomited over the  weekend 11. PREGNANCY: "Is there any chance you are pregnant?" "When was your last menstrual period?"       No  Protocols used: DIZZINESS [facility]

## 2018-03-09 ENCOUNTER — Ambulatory Visit (INDEPENDENT_AMBULATORY_CARE_PROVIDER_SITE_OTHER): Payer: Medicare Other | Admitting: Family Medicine

## 2018-03-09 ENCOUNTER — Other Ambulatory Visit: Payer: Self-pay

## 2018-03-09 ENCOUNTER — Encounter: Payer: Self-pay | Admitting: Family Medicine

## 2018-03-09 VITALS — BP 142/89 | HR 65 | Temp 99.0°F | Resp 20 | Ht 62.95 in | Wt 160.4 lb

## 2018-03-09 DIAGNOSIS — R197 Diarrhea, unspecified: Secondary | ICD-10-CM | POA: Diagnosis not present

## 2018-03-09 DIAGNOSIS — R112 Nausea with vomiting, unspecified: Secondary | ICD-10-CM

## 2018-03-09 DIAGNOSIS — R42 Dizziness and giddiness: Secondary | ICD-10-CM

## 2018-03-09 LAB — POC MICROSCOPIC URINALYSIS (UMFC): Mucus: ABSENT

## 2018-03-09 LAB — POCT URINALYSIS DIP (MANUAL ENTRY)
Bilirubin, UA: NEGATIVE
Glucose, UA: NEGATIVE mg/dL
Ketones, POC UA: NEGATIVE mg/dL
Leukocytes, UA: NEGATIVE
Nitrite, UA: NEGATIVE
Protein Ur, POC: NEGATIVE mg/dL
Spec Grav, UA: 1.01 (ref 1.010–1.025)
Urobilinogen, UA: 0.2 E.U./dL
pH, UA: 6.5 (ref 5.0–8.0)

## 2018-03-09 MED ORDER — MECLIZINE HCL 12.5 MG PO TABS: 12.5000 mg | ORAL_TABLET | Freq: Three times a day (TID) | ORAL | 0 refills | Status: DC | PRN

## 2018-03-09 NOTE — Progress Notes (Signed)
7/2/201910:18 AM  Beverly Ramirez May 17, 1961, 57 y.o. female [id number]  Chief Complaint  Patient presents with  . Dizziness    X 4 days  . Vomiting    X 4 days    HPI:   Patient is a 57 y.o. female with past medical history significant for UC who presents today for dizziness, vomiting and diarrhea that started last Friday, 5 days ago  She denies any new foods or sick contacts She reports that suddenly she felt very ill, tired and dizzy (room spinning) Then soon after she started vomiting and having watery diarrhea She reports able to drink fluids and clear diet Vomiting and diarrhea have resolved. She was able to eat solid food yesterday and did fine.  She still gets dizzy if she lies back Overall feeling much better today  Denies any fever, chills Denies cough, sob Denies any bloody diarrhea She did have abd cramping but no localized abd pain She denies any dysuria or hematuria  Fall Risk  03/09/2018 01/20/2018 01/08/2018 10/06/2017 09/25/2017  Falls in the past year? No No No No No  Number falls in past yr: - - - - -  Injury with Fall? - - - - -     Depression screen [facility] 2/9 03/09/2018 02/04/2018 01/20/2018  Decreased Interest 0 0 0  Down, Depressed, Hopeless 0 0 0  PHQ - 2 Score 0 0 0    Allergies  Allergen Reactions  . Codeine Itching, Rash and Hives  . Latex Hives    With the power    Prior to Admission medications   Medication Sig Start Date End Date Taking? Authorizing Provider  ezetimibe (ZETIA) 10 MG tablet Take 1 tablet (10 mg total) by mouth daily. 02/04/18  Yes Rutherford Guys, MD  gabapentin (NEURONTIN) 300 MG capsule Take 1 capsule (300 mg total) by mouth 3 (three) times daily. 01/08/18  Yes Rutherford Guys, MD  mesalamine (LIALDA) 1.2 g EC tablet Take 4.8 g daily with breakfast by mouth.   Yes [provider]  Olopatadine HCl 0.2 % SOLN Apply 1 drop to eye daily. 01/20/18  Yes Tereasa Coop, PA-C  triamcinolone cream (KENALOG) 0.1 % Apply  1 application topically 2 (two) times daily. 01/08/18  Yes Rutherford Guys, MD    Past Medical History:  Diagnosis Date  . Anemia   . Carpal tunnel syndrome    bilateral  . Chronic heel pain   . DJD (degenerative joint disease) of cervical spine    MRI 2017  . Hyperlipidemia   . Plantar fasciitis    bilateral  . UC (ulcerative colitis) ([place])     Past Surgical History:  Procedure Laterality Date  . ABDOMINAL HYSTERECTOMY    . APPENDECTOMY    . CESAREAN SECTION     x4  . COLONOSCOPY     Multiple in [place]  . ESOPHAGOGASTRODUODENOSCOPY      Social History   Tobacco Use  . Smoking status: Former Smoker    Types: Cigarettes    Last attempt to quit: 10/13/2006    Years since quitting: 11.4  . Smokeless tobacco: Never Used  Substance Use Topics  . Alcohol use: Yes    Comment: occasional    Family History  Problem Relation Age of Onset  . Stroke Mother   . Diabetes Mother   . Hypertension Mother   . Lung cancer Mother   . Sickle cell trait Mother   . Breast cancer Maternal Aunt   .  Diabetes Maternal Aunt   . Lung cancer Maternal Aunt   . Lung cancer Maternal Aunt   . Lung cancer Maternal Aunt   . Colon cancer Cousin 1  . Sickle cell trait Father   . Sickle cell anemia Sister 84  . Lung cancer Maternal Grandmother   . Sickle cell anemia Sister   . Other Sister        accident and blood clot formed    ROS Per hpi  OBJECTIVE:  Blood pressure (!) 129/93, pulse 74, temperature 99 F (37.2 C), temperature source Oral, resp. rate 20, height 5' 2.95" (1.599 m), weight 160 lb 6.4 oz (72.8 kg), SpO2 98 %.  Physical Exam  Constitutional: She is oriented to person, place, and time. She appears well-developed and well-nourished. She does not appear ill.  HENT:  Head: Normocephalic and atraumatic.  Right Ear: Hearing, tympanic membrane, external ear and ear canal normal.  Left Ear: Hearing, tympanic membrane, external ear and ear canal normal.  Mouth/Throat:  Oropharynx is clear and moist.  Eyes: Pupils are equal, round, and reactive to light. EOM are normal.  Neck: Neck supple.  Cardiovascular: Normal rate, regular rhythm and normal heart sounds. Exam reveals no gallop and no friction rub.  No murmur heard. Pulmonary/Chest: Effort normal and breath sounds normal. She has no wheezes. She has no rales.  Abdominal: Soft. Bowel sounds are normal. She exhibits no distension. There is no hepatosplenomegaly. There is tenderness in the right upper quadrant, epigastric area and left upper quadrant. There is no rebound, no guarding, no CVA tenderness and negative Murphy's sign.  Lymphadenopathy:    She has no cervical adenopathy.  Neurological: She is alert and oriented to person, place, and time.  Skin: Skin is warm and dry. Capillary refill takes 2 to 3 seconds.  Psychiatric: She has a normal mood and affect.  Nursing note and vitals reviewed.     Results for orders placed or performed in visit on 03/09/18 (from the past 24 hour(s))  POCT urinalysis dipstick     Status: Abnormal   Collection Time: 03/09/18 11:00 AM  Result Value Ref Range   Color, UA yellow yellow   Clarity, UA clear clear   Glucose, UA negative negative mg/dL   Bilirubin, UA negative negative   Ketones, POC UA negative negative mg/dL   Spec Grav, UA 1.010 1.010 - 1.025   Blood, UA trace-intact (A) negative   pH, UA 6.5 5.0 - 8.0   Protein Ur, POC negative negative mg/dL   Urobilinogen, UA 0.2 0.2 or 1.0 E.U./dL   Nitrite, UA Negative Negative   Leukocytes, UA Negative Negative  POCT Microscopic Urinalysis ([facility])     Status: Abnormal   Collection Time: 03/09/18 11:18 AM  Result Value Ref Range   WBC,UR,HPF,POC Moderate (A) None WBC/hpf   RBC,UR,HPF,POC None None RBC/hpf   Bacteria Few (A) None, Too numerous to count   Mucus Absent Absent   Epithelial Cells, UR Per Microscopy Few (A) None, Too numerous to count cells/hpf      ASSESSMENT and PLAN  1. Nausea and  vomiting, intractability of vomiting not specified, unspecified vomiting type 2. Diarrhea, unspecified type Resolved, most likely viral. continue slowly advancing diet.  - POCT urinalysis dipstick - POCT Microscopic Urinalysis ([facility])  3. Vertigo - meclizine (ANTIVERT) 12.5 MG tablet; Take 1 tablet (12.5 mg total) by mouth 3 (three) times daily as needed for dizziness.  Return if symptoms worsen or fail to improve.    Ellias Mcelreath  Reubin Milan, MD Primary Care at [facility], [place] [address] Ph.  [phone] Fax [phone]

## 2018-03-09 NOTE — Patient Instructions (Addendum)
Continue slowly advancing diet as tolerated. Keep pushing fluids.  Meclizine to be used as needed for dizziness. It can cause sleepiness.     IF you received an x-ray today, you will receive an invoice from [facility]. Please contact [facility] at [phone] with questions or concerns regarding your invoice.   IF you received labwork today, you will receive an invoice from [facility]. Please contact [facility] at [phone] with questions or concerns regarding your invoice.   Our billing staff will not be able to assist you with questions regarding bills from these companies.  You will be contacted with the lab results as soon as they are available. The fastest way to get your results is to activate your My Chart account. Instructions are located on the last page of this paperwork. If you have not heard from [place] regarding the results in 2 weeks, please contact this office.

## 2018-03-13 ENCOUNTER — Other Ambulatory Visit: Payer: Self-pay | Admitting: Family Medicine

## 2018-03-14 NOTE — Telephone Encounter (Signed)
Refill req sent to Dr. Pamella Pert - [place] discontinued by Dr. Pamella Pert previously.

## 2018-04-01 ENCOUNTER — Other Ambulatory Visit: Payer: Self-pay

## 2018-04-01 DIAGNOSIS — D72828 Other elevated white blood cell count: Secondary | ICD-10-CM

## 2018-04-02 ENCOUNTER — Other Ambulatory Visit: Payer: Medicare Other

## 2018-04-02 ENCOUNTER — Ambulatory Visit: Payer: Medicare Other | Admitting: Hematology

## 2018-04-12 ENCOUNTER — Ambulatory Visit (INDEPENDENT_AMBULATORY_CARE_PROVIDER_SITE_OTHER): Payer: Medicare Other | Admitting: Family Medicine

## 2018-04-12 ENCOUNTER — Ambulatory Visit (INDEPENDENT_AMBULATORY_CARE_PROVIDER_SITE_OTHER): Payer: Medicare Other

## 2018-04-12 ENCOUNTER — Other Ambulatory Visit: Payer: Self-pay

## 2018-04-12 ENCOUNTER — Encounter: Payer: Self-pay | Admitting: Family Medicine

## 2018-04-12 ENCOUNTER — Ambulatory Visit: Payer: Medicare Other

## 2018-04-12 VITALS — BP 154/88 | HR 63 | Temp 98.0°F | Ht 62.0 in | Wt 162.2 lb

## 2018-04-12 DIAGNOSIS — M130 Polyarthritis, unspecified: Secondary | ICD-10-CM

## 2018-04-12 DIAGNOSIS — R748 Abnormal levels of other serum enzymes: Secondary | ICD-10-CM

## 2018-04-12 DIAGNOSIS — M25531 Pain in right wrist: Secondary | ICD-10-CM

## 2018-04-12 DIAGNOSIS — M25572 Pain in left ankle and joints of left foot: Secondary | ICD-10-CM | POA: Diagnosis not present

## 2018-04-12 DIAGNOSIS — M25532 Pain in left wrist: Secondary | ICD-10-CM

## 2018-04-12 DIAGNOSIS — M2550 Pain in unspecified joint: Secondary | ICD-10-CM | POA: Diagnosis not present

## 2018-04-12 DIAGNOSIS — E785 Hyperlipidemia, unspecified: Secondary | ICD-10-CM

## 2018-04-12 DIAGNOSIS — M25571 Pain in right ankle and joints of right foot: Secondary | ICD-10-CM | POA: Diagnosis not present

## 2018-04-12 DIAGNOSIS — K5190 Ulcerative colitis, unspecified, without complications: Secondary | ICD-10-CM | POA: Diagnosis not present

## 2018-04-12 IMAGING — DX DG ANKLE COMPLETE 3+V*R*
4 series · 4 of 4 positions shown · non-contrast
Comparison: Bilateral feet radiograph [DATE]

CLINICAL DATA: Arthralgia, ulcerative colitis.

EXAM:
LEFT ANKLE COMPLETE - 3+ VIEW; RIGHT ANKLE - COMPLETE 3+ VIEW

[ankle ap]
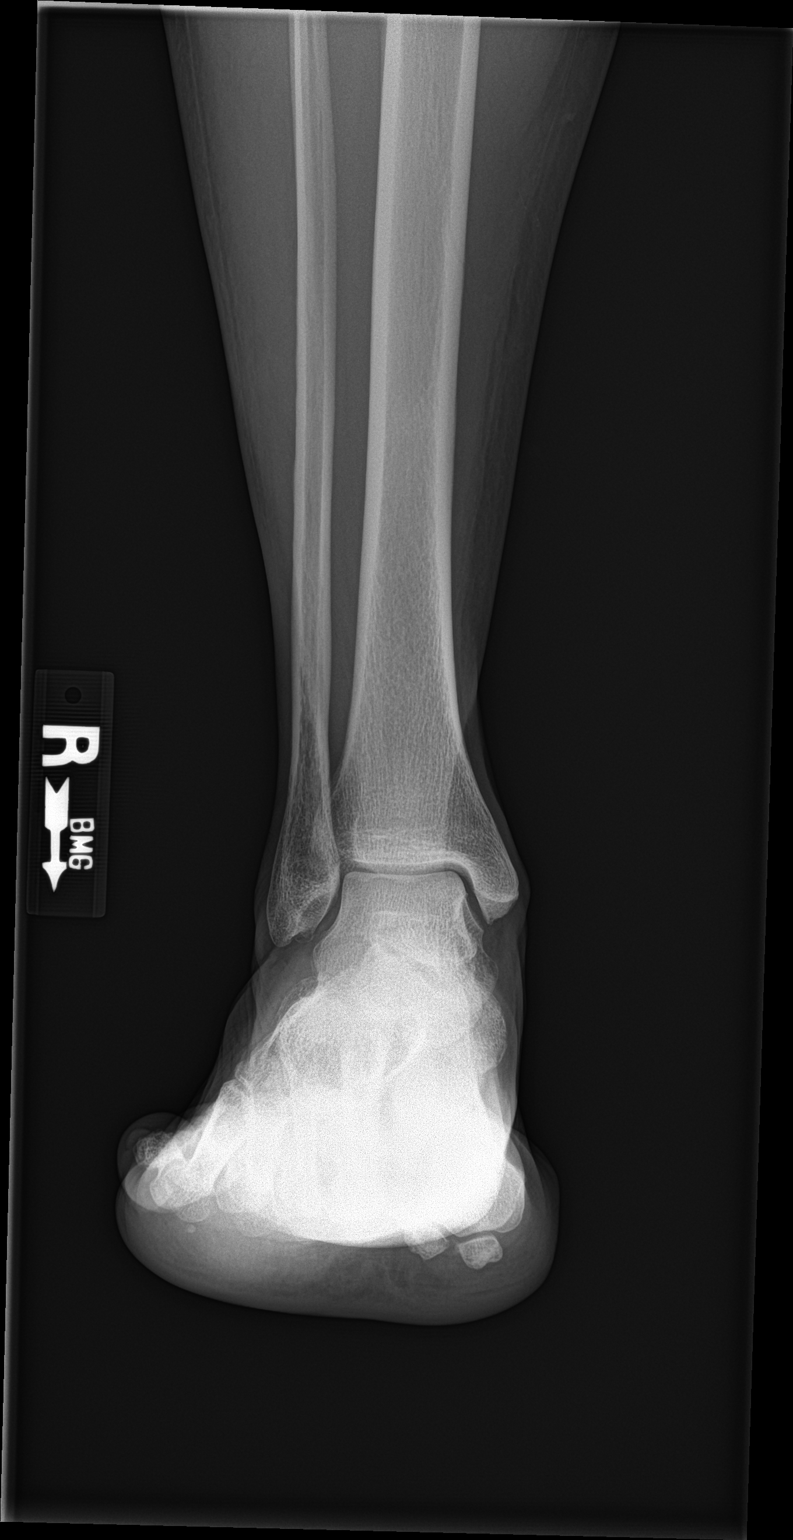

[ankle obl (1 of 2)]
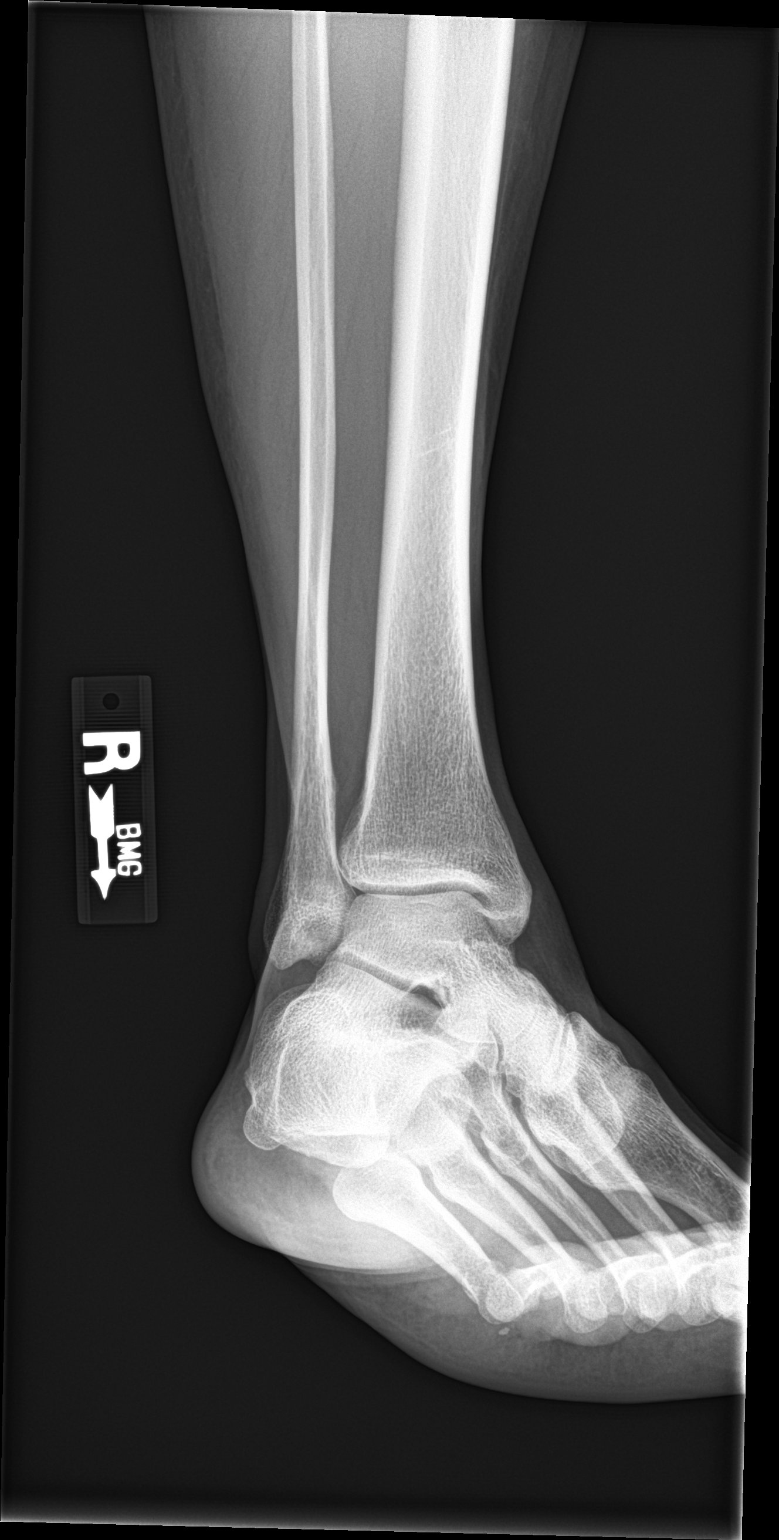

[ankle lat]
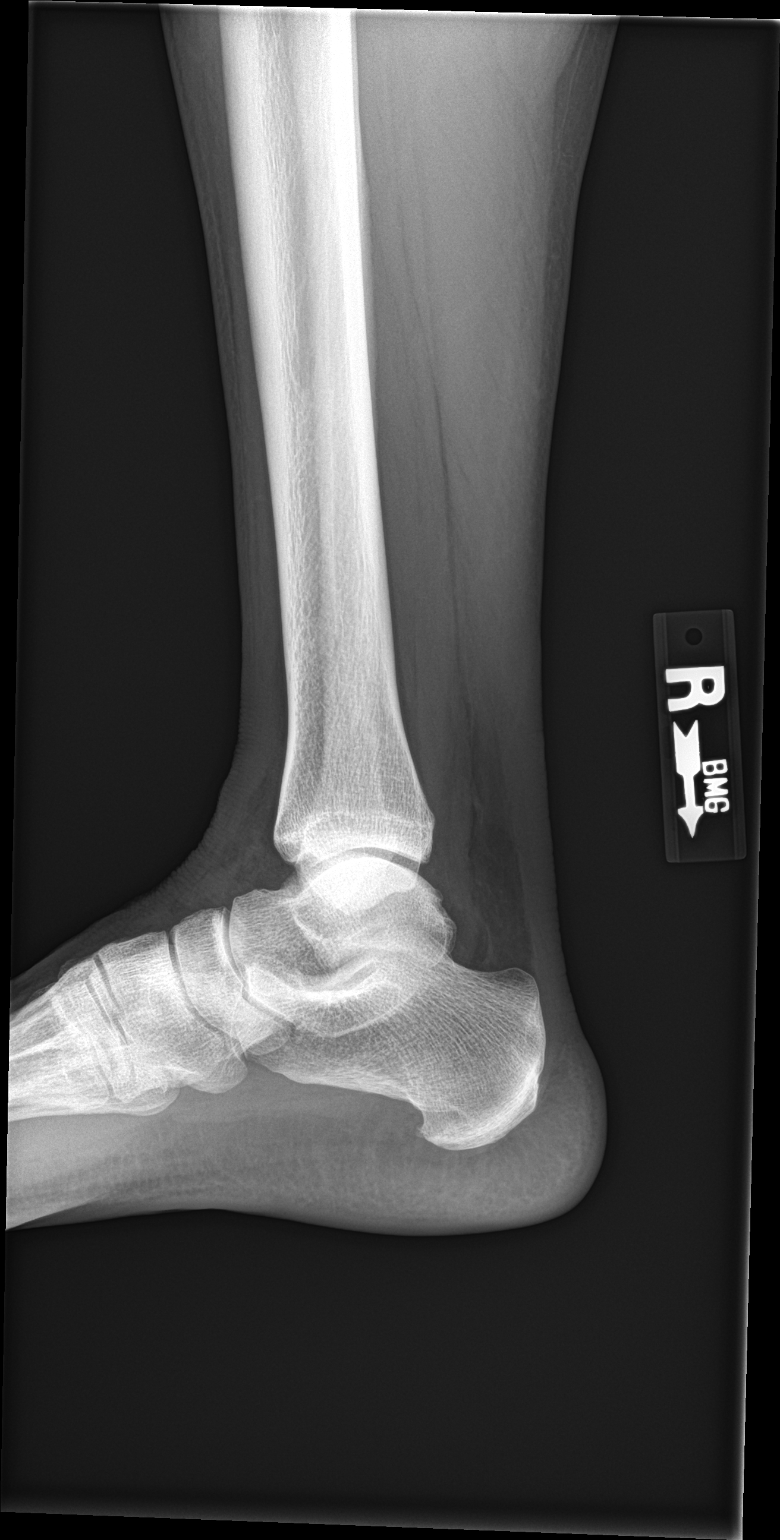

[ankle obl (2 of 2)]
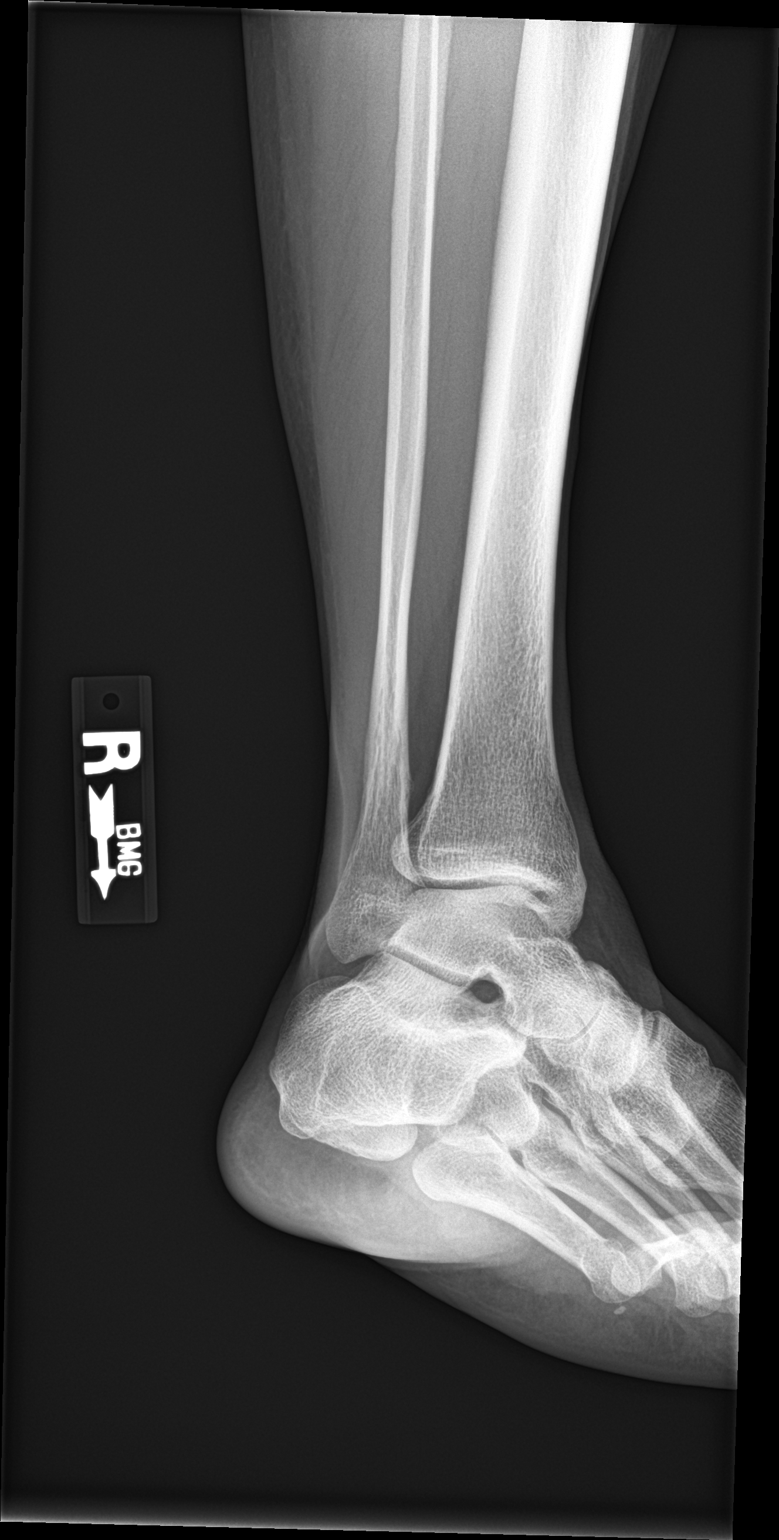

[4 of 4 positions shown; findings below may reference images not displayed]

FINDINGS: No fracture deformity nor dislocation. The ankle mortise appears
congruent and the tibiofibular syndesmosis intact. No erosions. No
destructive bony lesions. Soft tissue planes are non-suspicious.
IMPRESSION: Negative.

## 2018-04-12 IMAGING — DX DG ANKLE COMPLETE 3+V*L*
4 series · 4 of 4 positions shown · non-contrast
Comparison: Bilateral feet radiograph [DATE]

CLINICAL DATA: Arthralgia, ulcerative colitis.

EXAM:
LEFT ANKLE COMPLETE - 3+ VIEW; RIGHT ANKLE - COMPLETE 3+ VIEW

[ankle obl (1 of 2)]
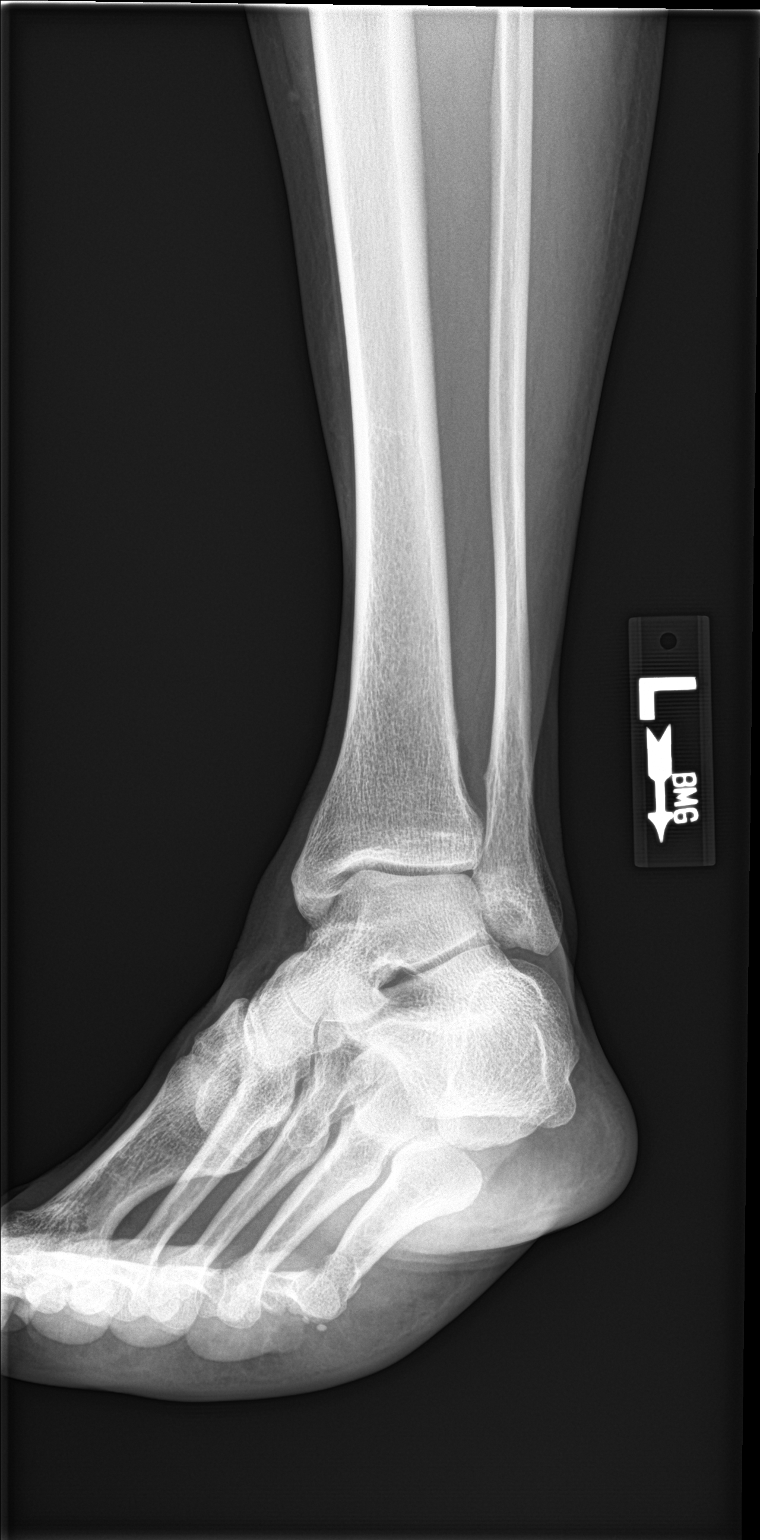

[ankle ap]
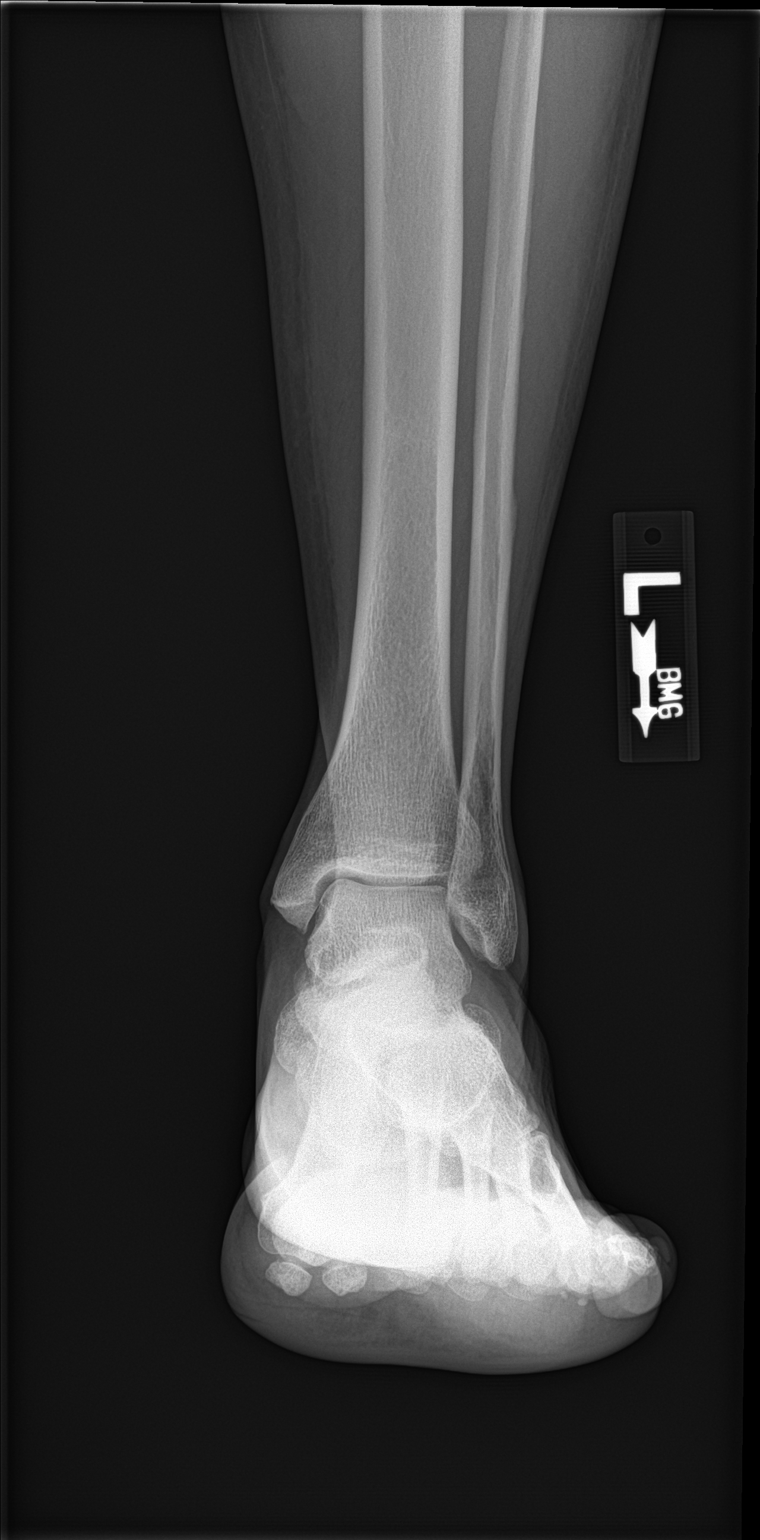

[ankle lat]
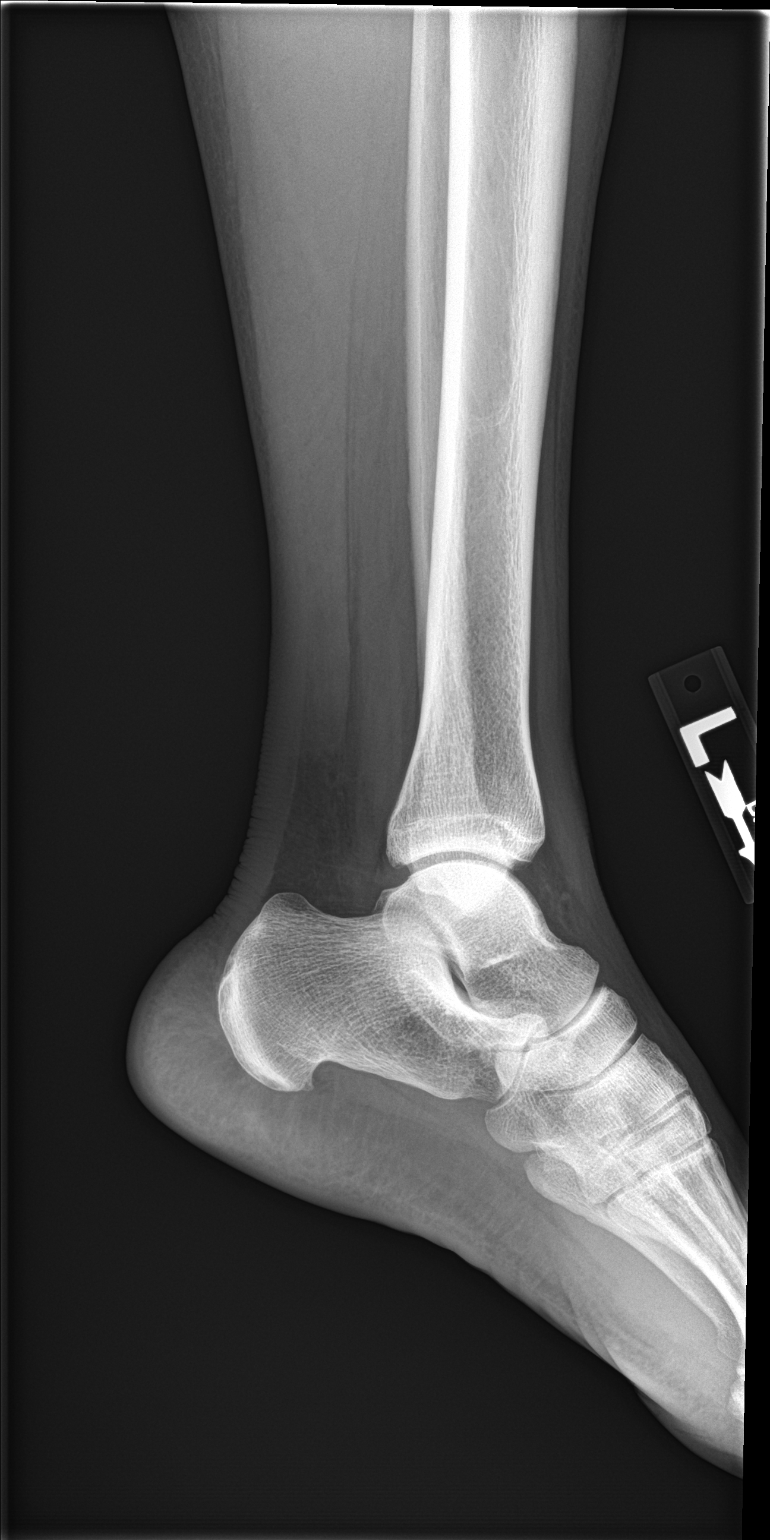

[ankle obl (2 of 2)]
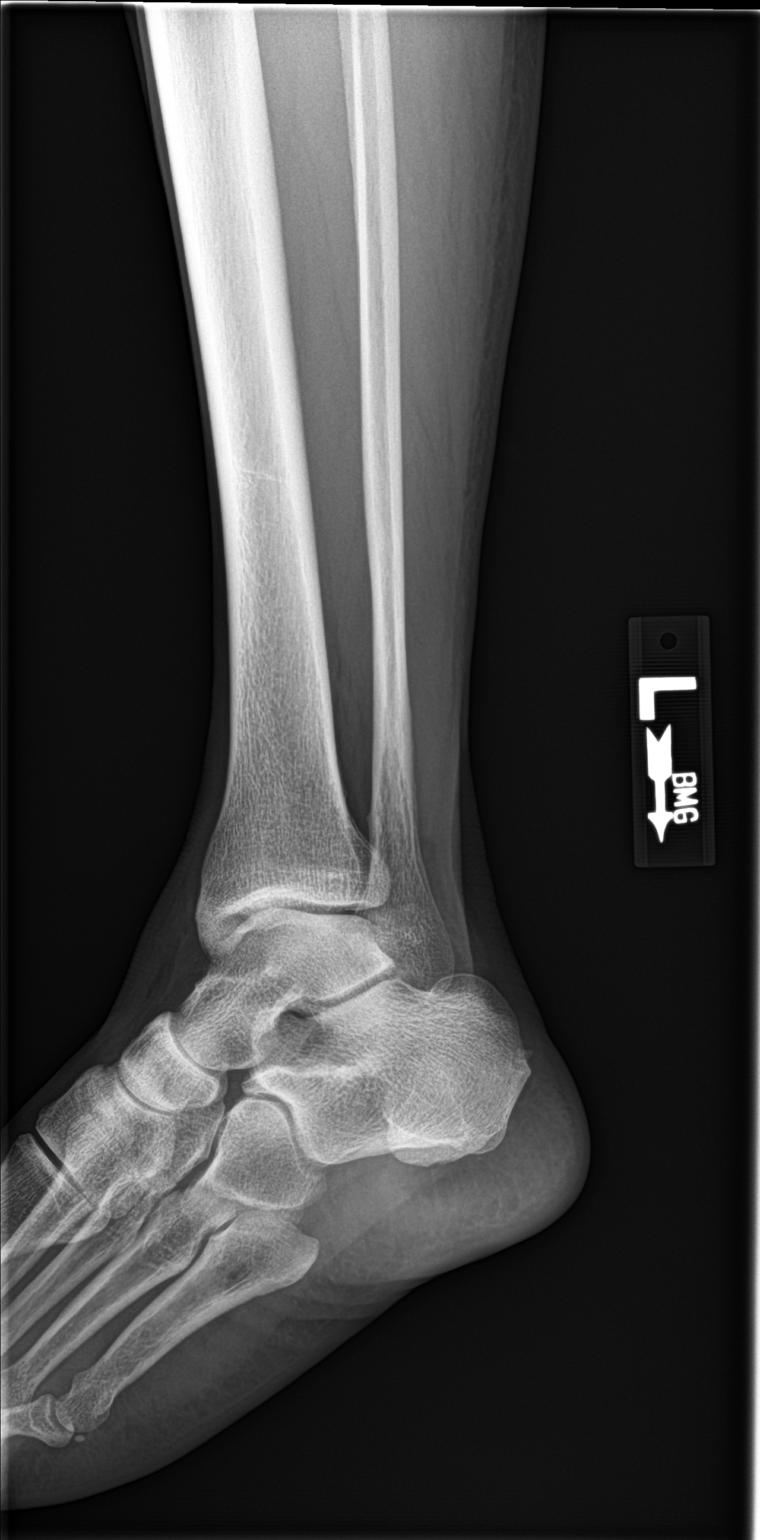

[4 of 4 positions shown; findings below may reference images not displayed]

FINDINGS: No fracture deformity nor dislocation. The ankle mortise appears
congruent and the tibiofibular syndesmosis intact. No erosions. No
destructive bony lesions. Soft tissue planes are non-suspicious.
IMPRESSION: Negative.

## 2018-04-12 IMAGING — DX DG WRIST 2V*L*
2 series · 2 of 2 positions shown · non-contrast
Comparison: None.

CLINICAL DATA: Arthralgia.

EXAM:
LEFT WRIST - 2 VIEW; RIGHT WRIST - 2 VIEW

[wrist pa]
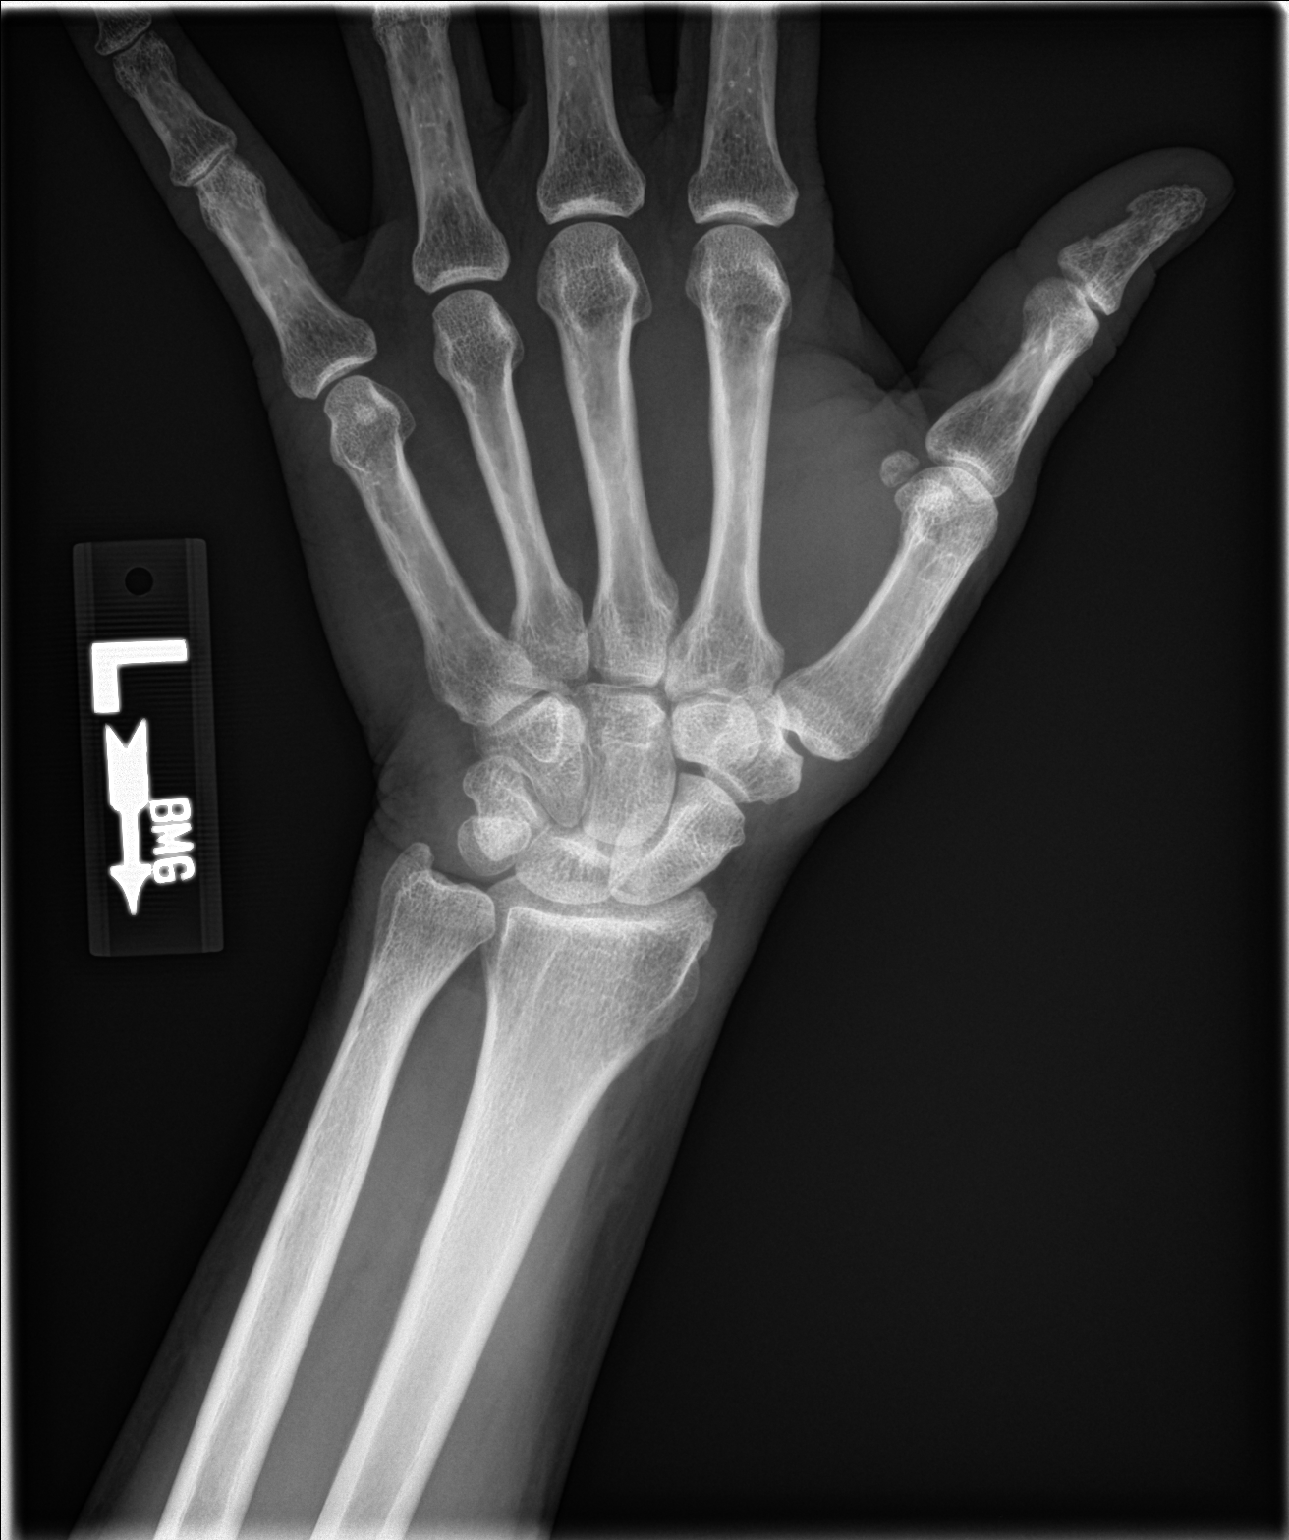

[wrist lat]
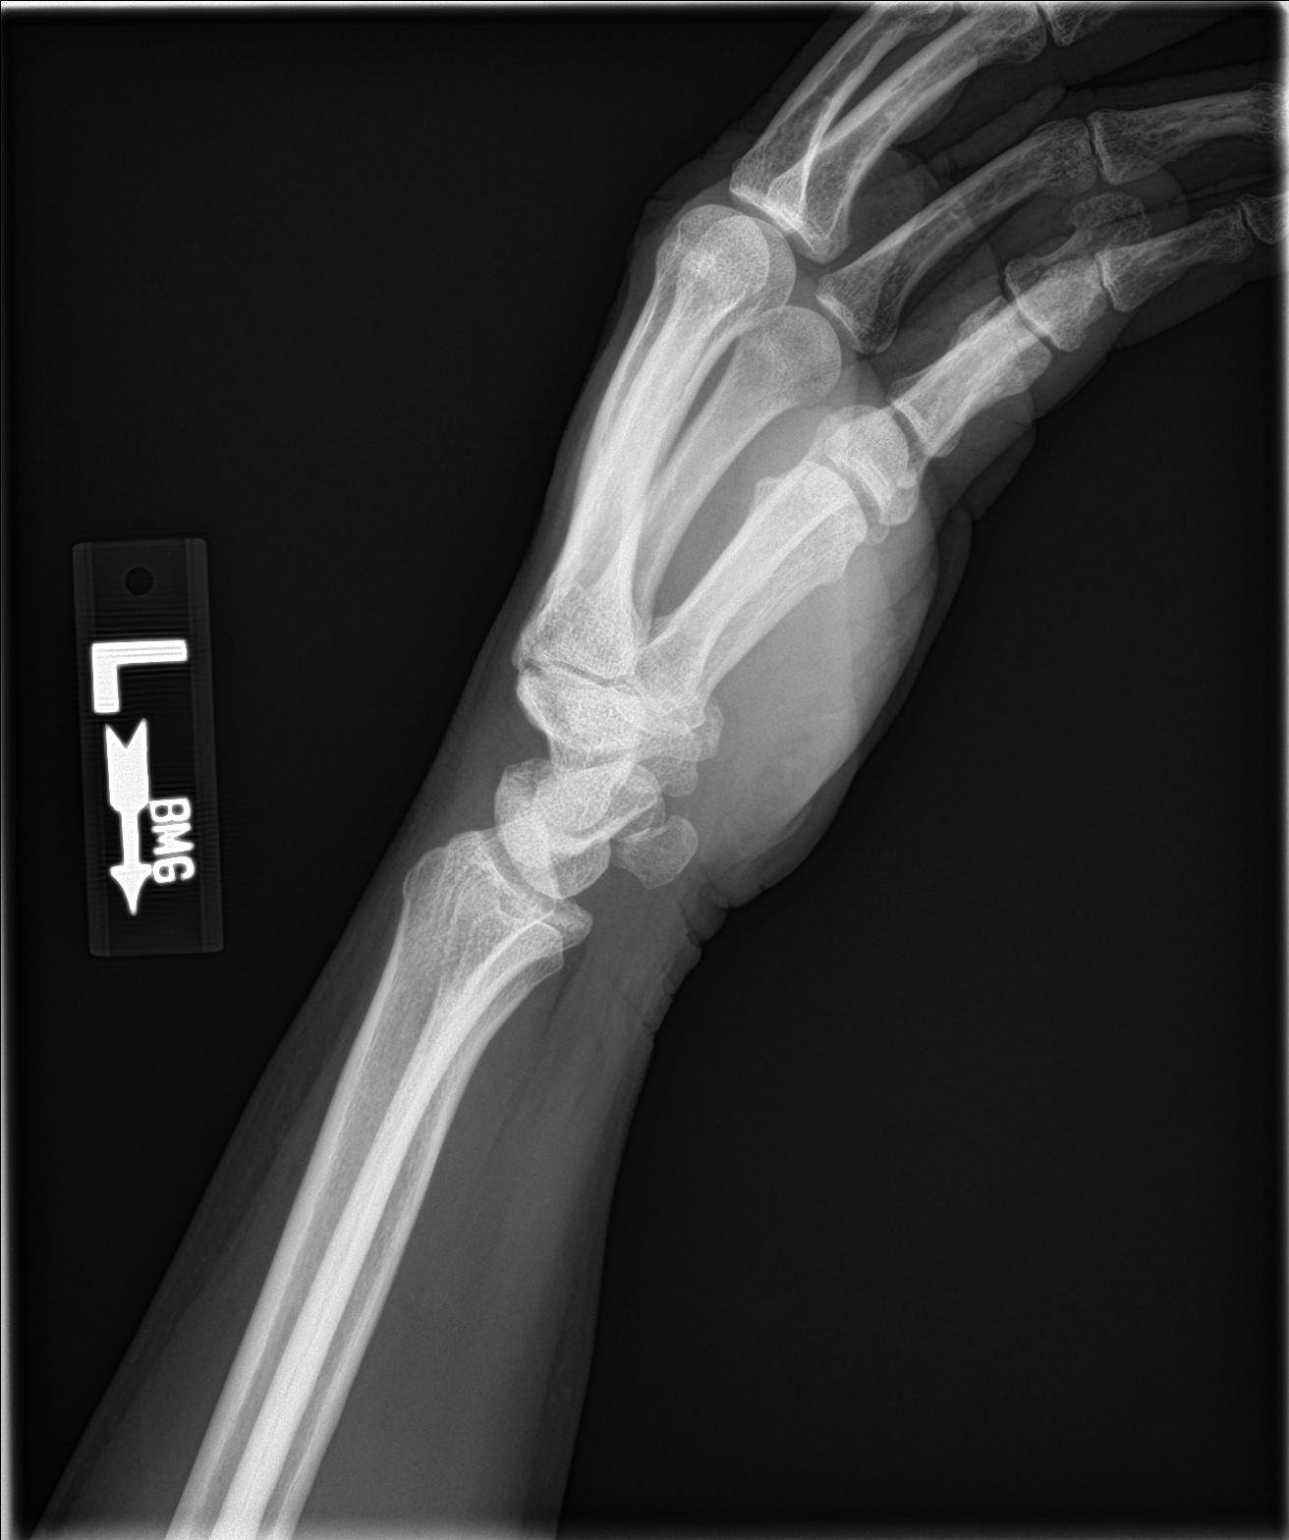

[2 of 2 positions shown; findings below may reference images not displayed]

FINDINGS: There is no evidence of fracture or dislocation. There is no
evidence of arthropathy, erosions or other focal bone abnormality.
Soft tissues are non suspicious, no periarticular calcifications.
IMPRESSION: Negative.

## 2018-04-12 IMAGING — DX DG WRIST 2V*R*
2 series · 2 of 2 positions shown · non-contrast
Comparison: None.

CLINICAL DATA: Arthralgia.

EXAM:
LEFT WRIST - 2 VIEW; RIGHT WRIST - 2 VIEW

[wrist pa]
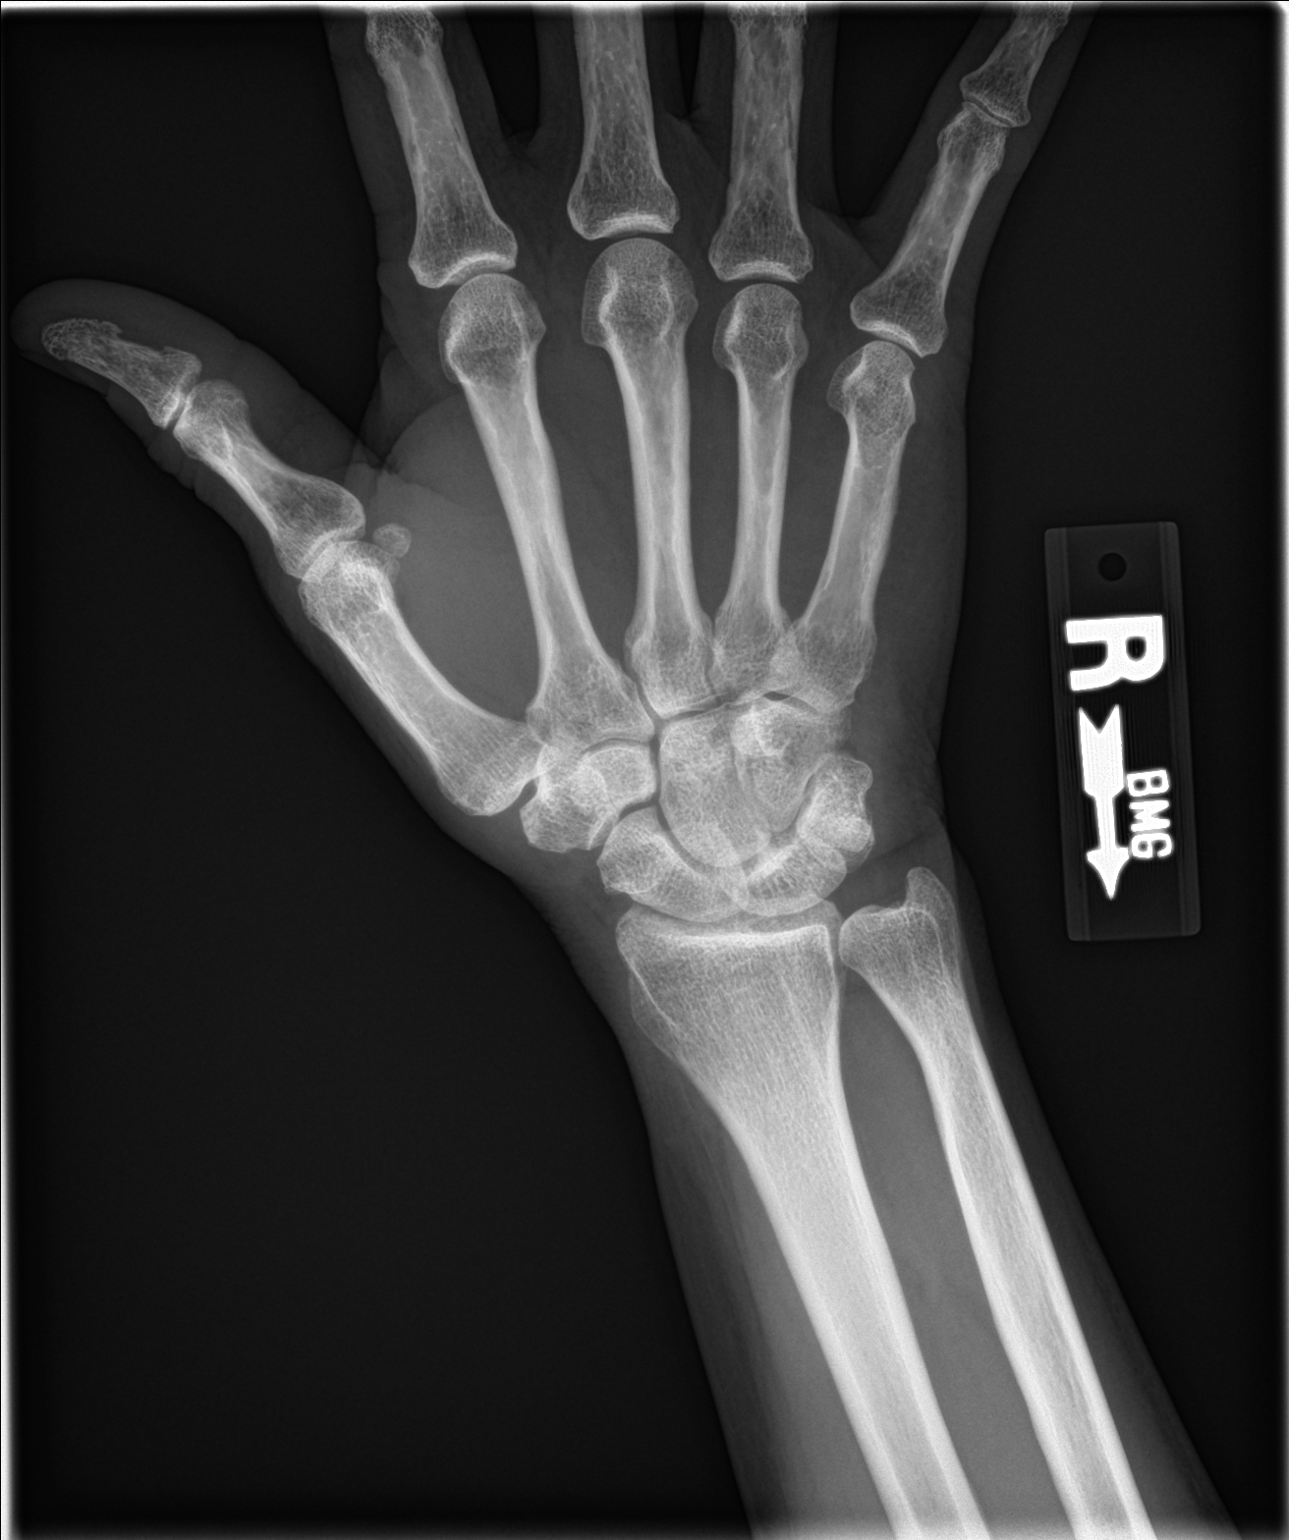

[wrist lat]
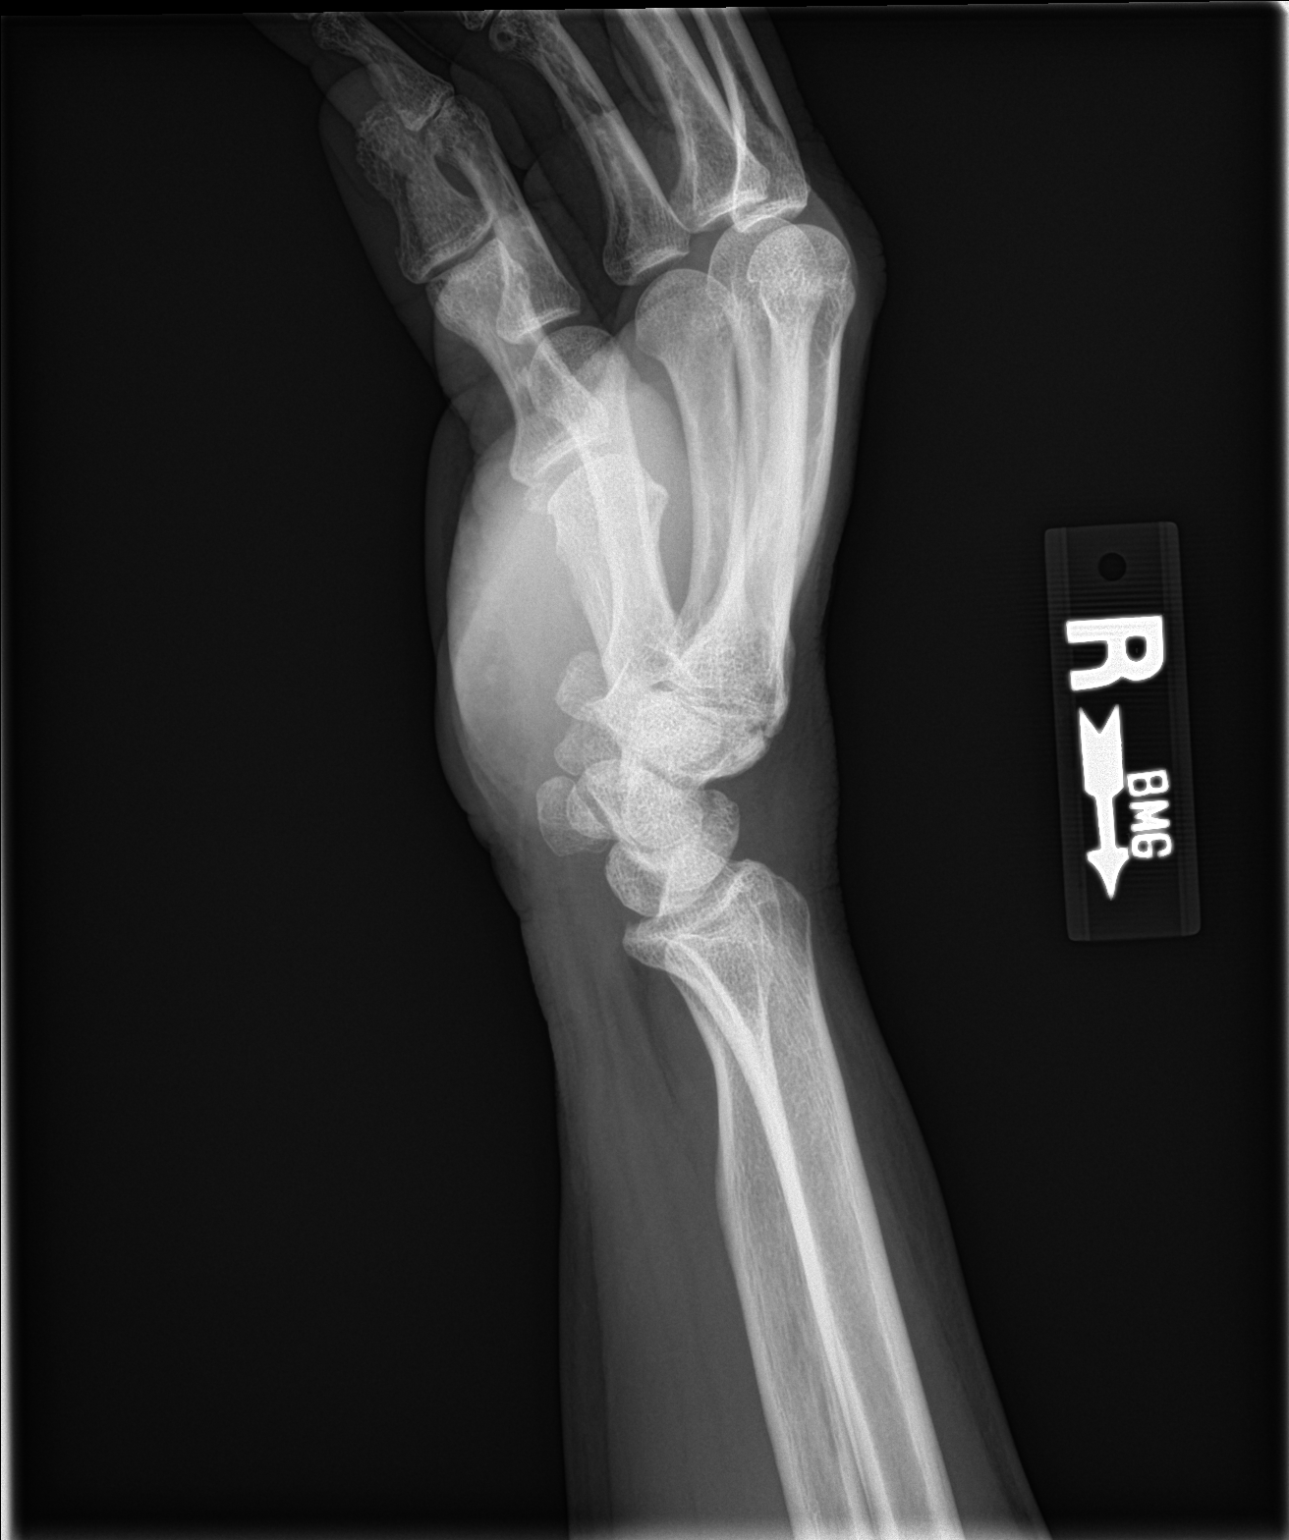

[2 of 2 positions shown; findings below may reference images not displayed]

FINDINGS: There is no evidence of fracture or dislocation. There is no
evidence of arthropathy, erosions or other focal bone abnormality.
Soft tissues are non suspicious, no periarticular calcifications.
IMPRESSION: Negative.

## 2018-04-12 MED ORDER — EZETIMIBE 10 MG PO TABS: 10.0000 mg | ORAL_TABLET | Freq: Every day | ORAL | 1 refills | Status: DC

## 2018-04-12 MED ORDER — PREDNISONE 20 MG PO TABS: ORAL_TABLET | ORAL | 0 refills | Status: AC

## 2018-04-12 MED ORDER — MESALAMINE 1.2 G PO TBEC: 4.8000 g | DELAYED_RELEASE_TABLET | Freq: Every day | ORAL | 2 refills | Status: DC

## 2018-04-12 NOTE — Patient Instructions (Addendum)
Start checking blood pressure at home every day around the same time. Normal blood pressure is 100-120/60-80. We consider medication if 140/90 or higher.   Melatonin: Take this supplement by mouth with a glass of water. Do not take with food. This supplement is usually taken 1 or 2 hours before bedtime. After taking this supplement, limit your activities to those needed to prepare for bed. Some products may be chewed or dissolved in the mouth before swallowing. Some tablets or capsules must be swallowed whole; do not cut, crush or chew. Follow the directions on the package labeling, or take as directed by your health care professional. Do not take this supplement more often than directed.   IF you received an x-ray today, you will receive an invoice from [facility]. Please contact [facility] at [phone] with questions or concerns regarding your invoice.   IF you received labwork today, you will receive an invoice from [place]. Please contact [facility] at [phone] with questions or concerns regarding your invoice.   Our billing staff will not be able to assist you with questions regarding bills from these companies.  You will be contacted with the lab results as soon as they are available. The fastest way to get your results is to activate your My Chart account. Instructions are located on the last page of this paperwork. If you have not heard from [place] regarding the results in 2 weeks, please contact this office.    Insomnia Insomnia is a sleep disorder that makes it difficult to fall asleep or to stay asleep. Insomnia can cause tiredness (fatigue), low energy, difficulty concentrating, mood swings, and poor performance at work or school. There are three different ways to classify insomnia:  Difficulty falling asleep.  Difficulty staying asleep.  Waking up too early in the morning.  Any type of insomnia can be long-term (chronic) or short-term (acute). Both are  common. Short-term insomnia usually lasts for three months or less. Chronic insomnia occurs at least three times a week for longer than three months. What are the causes? Insomnia may be caused by another condition, situation, or substance, such as:  Anxiety.  Certain medicines.  Gastroesophageal reflux disease (GERD) or other gastrointestinal conditions.  Asthma or other breathing conditions.  Restless legs syndrome, sleep apnea, or other sleep disorders.  Chronic pain.  Menopause. This may include hot flashes.  Stroke.  Abuse of alcohol, tobacco, or illegal drugs.  Depression.  Caffeine.  Neurological disorders, such as Alzheimer disease.  An overactive thyroid (hyperthyroidism).  The cause of insomnia may not be known. What increases the risk? Risk factors for insomnia include:  Gender. Women are more commonly affected than men.  Age. Insomnia is more common as you get older.  Stress. This may involve your professional or personal life.  Income. Insomnia is more common in people with lower income.  Lack of exercise.  Irregular work schedule or night shifts.  Traveling between different time zones.  What are the signs or symptoms? If you have insomnia, trouble falling asleep or trouble staying asleep is the main symptom. This may lead to other symptoms, such as:  Feeling fatigued.  Feeling nervous about going to sleep.  Not feeling rested in the morning.  Having trouble concentrating.  Feeling irritable, anxious, or depressed.  How is this treated? Treatment for insomnia depends on the cause. If your insomnia is caused by an underlying condition, treatment will focus on addressing the condition. Treatment may also include:  Medicines to help you  sleep.  Counseling or therapy.  Lifestyle adjustments.  Follow these instructions at home:  Take medicines only as directed by your health care provider.  Keep regular sleeping and waking hours.  Avoid naps.  Keep a sleep diary to help you and your health care provider figure out what could be causing your insomnia. Include: ? When you sleep. ? When you wake up during the night. ? How well you sleep. ? How rested you feel the next day. ? Any side effects of medicines you are taking. ? What you eat and drink.  Make your bedroom a comfortable place where it is easy to fall asleep: ? Put up shades or special blackout curtains to block light from outside. ? Use a white noise machine to block noise. ? Keep the temperature cool.  Exercise regularly as directed by your health care provider. Avoid exercising right before bedtime.  Use relaxation techniques to manage stress. Ask your health care provider to suggest some techniques that may work well for you. These may include: ? Breathing exercises. ? Routines to release muscle tension. ? Visualizing peaceful scenes.  Cut back on alcohol, caffeinated beverages, and cigarettes, especially close to bedtime. These can disrupt your sleep.  Do not overeat or eat spicy foods right before bedtime. This can lead to digestive discomfort that can make it hard for you to sleep.  Limit screen use before bedtime. This includes: ? Watching TV. ? Using your smartphone, tablet, and computer.  Stick to a routine. This can help you fall asleep faster. Try to do a quiet activity, brush your teeth, and go to bed at the same time each night.  Get out of bed if you are still awake after 15 minutes of trying to sleep. Keep the lights down, but try reading or doing a quiet activity. When you feel sleepy, go back to bed.  Make sure that you drive carefully. Avoid driving if you feel very sleepy.  Keep all follow-up appointments as directed by your health care provider. This is important. Contact a health care provider if:  You are tired throughout the day or have trouble in your daily routine due to sleepiness.  You continue to have sleep problems or  your sleep problems get worse. Get help right away if:  You have serious thoughts about hurting yourself or someone else. This information is not intended to replace advice given to you by your health care provider. Make sure you discuss any questions you have with your health care provider. Document Released: 08/22/2000 Document Revised: 01/25/2016 Document Reviewed: 05/26/2014 Elsevier Interactive Patient Education  [employer].

## 2018-04-12 NOTE — Progress Notes (Signed)
8/5/20195:37 PM  Beverly Ramirez October 05, 1960, 57 y.o. female [id number]  Chief Complaint  Patient presents with  . Generalized Body Aches    pain from head to toe. Needs clarification on the medication she is taking. Not sure what is causing the colitis flares    HPI:   Patient is a 57 y.o. female with past medical history significant for UC and HLP who presents today for 4-5 days of joint pain   She reports that her wrist, elbows, knees, ankles and feet having been severely hurting for past several days So intense at times that she has been using a cane  She denies any redness, swelling, or warmth of these joints She has never had this type of pain before  She has also been struggling with UC flare ups Very sore mouth and abd with diarrhea, not bloody of recent Last flare up started about a week ago She took several days of pred 20, stopped ~ 3 days ago Last 2 BMs have been solid She also puts herself in a liquid diet during these times  She stopped all her medications about a month ago, including UC med She sees GI on Oct 1st  Fall Risk  04/12/2018 03/09/2018 01/20/2018 01/08/2018 10/06/2017  Falls in the past year? _0   Number falls in past yr: - - - - -  Injury with Fall? - - - - -     Depression screen [facility] 2/9 04/12/2018 03/09/2018 02/04/2018  Decreased Interest 0 0 0  Down, Depressed, Hopeless 0 0 0  PHQ - 2 Score 0 0 0    Allergies  Allergen Reactions  . Codeine Itching, Rash and Hives  . Latex Hives    With the power    Prior to Admission medications   Medication Sig Start Date End Date Taking? Authorizing Provider  colchicine 0.6 MG tablet TAKE 1 TABLET BY MOUTH 2 (TWO) TIMES DAILY. 03/15/18   Rutherford Guys, MD  ezetimibe (ZETIA) 10 MG tablet Take 1 tablet (10 mg total) by mouth daily. 02/04/18   Rutherford Guys, MD  gabapentin (NEURONTIN) 300 MG capsule Take 1 capsule (300 mg total) by mouth 3 (three) times daily. 01/08/18   Rutherford Guys, MD    meclizine (ANTIVERT) 12.5 MG tablet Take 1 tablet (12.5 mg total) by mouth 3 (three) times daily as needed for dizziness. 03/09/18   Rutherford Guys, MD  mesalamine (LIALDA) 1.2 g EC tablet Take 4.8 g daily with breakfast by mouth.    [provider]  Olopatadine HCl 0.2 % SOLN Apply 1 drop to eye daily. 01/20/18   Tereasa Coop, PA-C  triamcinolone cream (KENALOG) 0.1 % Apply 1 application topically 2 (two) times daily. 01/08/18   Rutherford Guys, MD    Past Medical History:  Diagnosis Date  . Anemia   . Carpal tunnel syndrome    bilateral  . Chronic heel pain   . DJD (degenerative joint disease) of cervical spine    MRI 2017  . Hyperlipidemia   . Plantar fasciitis    bilateral  . UC (ulcerative colitis) ([place])     Past Surgical History:  Procedure Laterality Date  . ABDOMINAL HYSTERECTOMY    . APPENDECTOMY    . CESAREAN SECTION     x4  . COLONOSCOPY     Multiple in [place]  . ESOPHAGOGASTRODUODENOSCOPY      Social History   Tobacco Use  . Smoking status: Former  Smoker    Types: Cigarettes    Last attempt to quit: 10/13/2006    Years since quitting: 11.5  . Smokeless tobacco: Never Used  Substance Use Topics  . Alcohol use: Yes    Comment: occasional    Family History  Problem Relation Age of Onset  . Stroke Mother   . Diabetes Mother   . Hypertension Mother   . Lung cancer Mother   . Sickle cell trait Mother   . Breast cancer Maternal Aunt   . Diabetes Maternal Aunt   . Lung cancer Maternal Aunt   . Lung cancer Maternal Aunt   . Lung cancer Maternal Aunt   . Colon cancer Cousin 62  . Sickle cell trait Father   . Sickle cell anemia Sister 14  . Lung cancer Maternal Grandmother   . Sickle cell anemia Sister   . Other Sister        accident and blood clot formed    ROS Per hpi  OBJECTIVE:  Blood pressure (!) 154/88, pulse 63, temperature 98 F (36.7 C), temperature source Oral, height _0  (1.575 m), weight 162 lb 3.2 oz (73.6 kg),  SpO2 99 %. Body mass index is 29.67 kg/m.   Repeat BP 138/72  Wt Readings from Last 3 Encounters:  04/12/18 162 lb 3.2 oz (73.6 kg)  03/09/18 160 lb 6.4 oz (72.8 kg)  02/04/18 165 lb 3.2 oz (74.9 kg)     Physical Exam  Constitutional: She is oriented to person, place, and time. She appears well-developed and well-nourished.  HENT:  Head: Normocephalic and atraumatic.  Mouth/Throat: Oropharynx is clear and moist. No oropharyngeal exudate.  Eyes: Pupils are equal, round, and reactive to light. EOM are normal. No scleral icterus.  Neck: Neck supple.  Cardiovascular: Normal rate, regular rhythm and normal heart sounds. Exam reveals no gallop and no friction rub.  No murmur heard. Pulmonary/Chest: Effort normal and breath sounds normal. She has no wheezes. She has no rales.  Abdominal: Soft. Bowel sounds are normal. She exhibits no distension. There is tenderness.  Musculoskeletal: She exhibits no edema.  Joints with FROM, no synovitis, erythema or warmth  Neurological: She is alert and oriented to person, place, and time.  Skin: Skin is warm and dry.  Psychiatric: She has a normal mood and affect.  Nursing note and vitals reviewed.   Dg Wrist 2 Views Left  Result Date: 04/12/2018 CLINICAL DATA:  Arthralgia. EXAM: LEFT WRIST - 2 VIEW; RIGHT WRIST - 2 VIEW COMPARISON:  None. FINDINGS: There is no evidence of fracture or dislocation. There is no evidence of arthropathy, erosions or other focal bone abnormality. Soft tissues are non suspicious, no periarticular calcifications. IMPRESSION: Negative. Electronically Signed   By: Elon Alas M.D.   On: 04/12/2018 18:19   Dg Wrist 2 Views Right  Result Date: 04/12/2018 CLINICAL DATA:  Arthralgia. EXAM: LEFT WRIST - 2 VIEW; RIGHT WRIST - 2 VIEW COMPARISON:  None. FINDINGS: There is no evidence of fracture or dislocation. There is no evidence of arthropathy, erosions or other focal bone abnormality. Soft tissues are non suspicious, no  periarticular calcifications. IMPRESSION: Negative. Electronically Signed   By: Elon Alas M.D.   On: 04/12/2018 18:19   Dg Ankle Complete Left  Result Date: 04/12/2018 CLINICAL DATA:  Arthralgia, ulcerative colitis. EXAM: LEFT ANKLE COMPLETE - 3+ VIEW; RIGHT ANKLE - COMPLETE 3+ VIEW COMPARISON:  Bilateral feet radiograph May 26, 2017 FINDINGS: No fracture deformity nor dislocation. The ankle mortise appears congruent  and the tibiofibular syndesmosis intact. No erosions. No destructive bony lesions. Soft tissue planes are non-suspicious. IMPRESSION: Negative. Electronically Signed   By: Elon Alas M.D.   On: 04/12/2018 18:20   Dg Ankle Complete Right  Result Date: 04/12/2018 CLINICAL DATA:  Arthralgia, ulcerative colitis. EXAM: LEFT ANKLE COMPLETE - 3+ VIEW; RIGHT ANKLE - COMPLETE 3+ VIEW COMPARISON:  Bilateral feet radiograph May 26, 2017 FINDINGS: No fracture deformity nor dislocation. The ankle mortise appears congruent and the tibiofibular syndesmosis intact. No erosions. No destructive bony lesions. Soft tissue planes are non-suspicious. IMPRESSION: Negative. Electronically Signed   By: Elon Alas M.D.   On: 04/12/2018 18:20     ASSESSMENT and PLAN  1. Ulcerative colitis without complications, unspecified location  [facility]) Not controlled in setting of being off meds. Checking labs per below. Restart mesalamine. Reports has never been prescribed suppositories. Pred taper if needed. ESR/CRP might be normal in setting of recent pred use. Keep appt with GI - Sedimentation Rate - C-reactive protein - CK - CBC with Differential/Platelet - Comprehensive metabolic panel  2. Polyarthritis Related to UC??? Will re-eval once UC under control. Previous basic rheum wu was  - Sedimentation Rate - C-reactive protein - CK - CBC with Differential/Platelet - Comprehensive metabolic panel  3. Elevated CK Originally 2/2 to starting statin. Recheck today - DG Wrist 2  Views Left; Future - DG Wrist 2 Views Right; Future - DG Ankle Complete Left; Future - DG Ankle Complete Right; Future  4. Hyperlipidemia, unspecified hyperlipidemia type Restart zetia  Other orders - mesalamine (LIALDA) 1.2 g EC tablet; Take 4 tablets (4.8 g total) by mouth daily with breakfast. - predniSONE (DELTASONE) 20 MG tablet; Take 2 tablets (40 mg total) by mouth daily with breakfast for 3 days, THEN 1 tablet (20 mg total) daily with breakfast for 3 days, THEN 0.5 tablets (10 mg total) daily with breakfast for 3 days. - ezetimibe (ZETIA) 10 MG tablet; Take 1 tablet (10 mg total) by mouth daily.  Return in about 3 months (around 07/13/2018).    Rutherford Guys, MD Primary Care at [place], [place] [address] Ph.  [phone] Fax [phone]

## 2018-04-13 DIAGNOSIS — G5603 Carpal tunnel syndrome, bilateral upper limbs: Secondary | ICD-10-CM | POA: Insufficient documentation

## 2018-04-13 LAB — CBC WITH DIFFERENTIAL/PLATELET
Basophils Absolute: 0 10*3/uL (ref 0.0–0.2)
Basos: 0 %
EOS (ABSOLUTE): 1 10*3/uL — ABNORMAL HIGH (ref 0.0–0.4)
Eos: 8 %
Hematocrit: 36.5 % (ref 34.0–46.6)
Hemoglobin: 12.3 g/dL (ref 11.1–15.9)
Immature Grans (Abs): 0 10*3/uL (ref 0.0–0.1)
Immature Granulocytes: 0 %
Lymphocytes Absolute: 3.9 10*3/uL — ABNORMAL HIGH (ref 0.7–3.1)
Lymphs: 31 %
MCH: 28.7 pg (ref 26.6–33.0)
MCHC: 33.7 g/dL (ref 31.5–35.7)
MCV: 85 fL (ref 79–97)
Monocytes Absolute: 1.1 10*3/uL — ABNORMAL HIGH (ref 0.1–0.9)
Monocytes: 9 %
Neutrophils Absolute: 6.4 10*3/uL (ref 1.4–7.0)
Neutrophils: 52 %
Platelets: 359 10*3/uL (ref 150–450)
RBC: 4.29 x10E6/uL (ref 3.77–5.28)
RDW: 13.8 % (ref 12.3–15.4)
WBC: 12.6 10*3/uL — ABNORMAL HIGH (ref 3.4–10.8)

## 2018-04-13 LAB — COMPREHENSIVE METABOLIC PANEL
ALT: 30 IU/L (ref 0–32)
AST: 22 IU/L (ref 0–40)
Albumin/Globulin Ratio: 1.4 (ref 1.2–2.2)
Albumin: 3.9 g/dL (ref 3.5–5.5)
Alkaline Phosphatase: 97 IU/L (ref 39–117)
BUN/Creatinine Ratio: 13 (ref 9–23)
BUN: 11 mg/dL (ref 6–24)
Bilirubin Total: <0.2 mg/dL (ref 0.0–1.2)
CO2: 27 mmol/L (ref 20–29)
Calcium: 9.5 mg/dL (ref 8.7–10.2)
Chloride: 105 mmol/L (ref 96–106)
Creatinine, Ser: 0.87 mg/dL (ref 0.57–1.00)
GFR calc Af Amer: 86 mL/min/{1.73_m2} (ref >59)
GFR calc non Af Amer: 75 mL/min/{1.73_m2} (ref >59)
Globulin, Total: 2.7 g/dL (ref 1.5–4.5)
Glucose: 100 mg/dL — ABNORMAL HIGH (ref 65–99)
Potassium: 4.2 mmol/L (ref 3.5–5.2)
Sodium: 145 mmol/L — ABNORMAL HIGH (ref 134–144)
Total Protein: 6.6 g/dL (ref 6.0–8.5)

## 2018-04-13 LAB — SEDIMENTATION RATE: Sed Rate: 12 mm/hr (ref 0–40)

## 2018-04-13 LAB — CK: Total CK: 229 U/L — ABNORMAL HIGH (ref 24–173)

## 2018-04-13 LAB — C-REACTIVE PROTEIN: CRP: 4 mg/L (ref 0–10)

## 2018-05-07 ENCOUNTER — Ambulatory Visit (INDEPENDENT_AMBULATORY_CARE_PROVIDER_SITE_OTHER): Payer: Medicare Other | Admitting: Family Medicine

## 2018-05-07 ENCOUNTER — Encounter: Payer: Self-pay | Admitting: Family Medicine

## 2018-05-07 ENCOUNTER — Other Ambulatory Visit: Payer: Self-pay

## 2018-05-07 VITALS — Temp 97.7°F | Ht 63.0 in | Wt 161.8 lb

## 2018-05-07 DIAGNOSIS — R42 Dizziness and giddiness: Secondary | ICD-10-CM | POA: Diagnosis not present

## 2018-05-07 DIAGNOSIS — M791 Myalgia, unspecified site: Secondary | ICD-10-CM

## 2018-05-07 DIAGNOSIS — R748 Abnormal levels of other serum enzymes: Secondary | ICD-10-CM | POA: Diagnosis not present

## 2018-05-07 DIAGNOSIS — I10 Essential (primary) hypertension: Secondary | ICD-10-CM | POA: Diagnosis not present

## 2018-05-07 DIAGNOSIS — M130 Polyarthritis, unspecified: Secondary | ICD-10-CM | POA: Diagnosis not present

## 2018-05-07 DIAGNOSIS — Z23 Encounter for immunization: Secondary | ICD-10-CM

## 2018-05-07 DIAGNOSIS — G4719 Other hypersomnia: Secondary | ICD-10-CM

## 2018-05-07 MED ORDER — MECLIZINE HCL 25 MG PO TABS: 25.0000 mg | ORAL_TABLET | Freq: Three times a day (TID) | ORAL | 0 refills | Status: DC | PRN

## 2018-05-07 MED ORDER — AMLODIPINE BESYLATE 5 MG PO TABS: 5.0000 mg | ORAL_TABLET | Freq: Every day | ORAL | 3 refills | Status: DC

## 2018-05-07 NOTE — Progress Notes (Signed)
8/30/201911:11 AM  Beverly Ramirez Jun 26, 1961, 57 y.o. female [id number]  Chief Complaint  Patient presents with  . Ulcerative Colitis    had some flare up while on vacaiton along with extreme vertigo    HPI:   Patient is a 57 y.o. female with past medical history significant for UC, HLP, anemia who presents today with several concerns  Just came back from vacation - had a wonderful time But when she came back started having spinning sensation and headaches Spinning only happens if she lies down flat Getting a little better While she was away her mouth got very sore, her stomach was very sore but no diarrhea Last colonoscopy about 3 years ago - was normal  Mild snoring, falls asleep easily during the day Waking up with headaches Has a hard time falling asleep and if she awakens in the middle of the night really hard to get back to sleep  Muscle pains and tiredness, mostly forearms Hand peels Feels better when she takes pred for presumed UC flare up Last pred was 6 days ago  Has started checking BP at home 140/80s   Fall Risk  05/07/2018 04/12/2018 03/09/2018 01/20/2018 01/08/2018  Falls in the past year? No No No No No  Number falls in past yr: - - - - -  Injury with Fall? - - - - -     Depression screen [facility] 2/9 05/07/2018 04/12/2018 03/09/2018  Decreased Interest 0 0 0  Down, Depressed, Hopeless 0 0 0  PHQ - 2 Score 0 0 0    Allergies  Allergen Reactions  . Codeine Itching, Rash and Hives  . Latex Hives    With the power    Prior to Admission medications   Medication Sig Start Date End Date Taking? Authorizing Provider  colchicine 0.6 MG tablet TAKE 1 TABLET BY MOUTH 2 (TWO) TIMES DAILY. 03/15/18  Yes Rutherford Guys, MD  ezetimibe (ZETIA) 10 MG tablet Take 1 tablet (10 mg total) by mouth daily. 04/12/18  Yes Rutherford Guys, MD  mesalamine (LIALDA) 1.2 g EC tablet Take 4 tablets (4.8 g total) by mouth daily with breakfast. 04/12/18 07/11/18 Yes Rutherford Guys, MD     Past Medical History:  Diagnosis Date  . Anemia   . Carpal tunnel syndrome    bilateral  . Chronic heel pain   . DJD (degenerative joint disease) of cervical spine    MRI 2017  . Hyperlipidemia   . Plantar fasciitis    bilateral  . UC (ulcerative colitis) ([place])     Past Surgical History:  Procedure Laterality Date  . ABDOMINAL HYSTERECTOMY    . APPENDECTOMY    . CESAREAN SECTION     x4  . COLONOSCOPY     Multiple in [place]  . ESOPHAGOGASTRODUODENOSCOPY      Social History   Tobacco Use  . Smoking status: Former Smoker    Types: Cigarettes    Last attempt to quit: 10/13/2006    Years since quitting: 11.5  . Smokeless tobacco: Never Used  Substance Use Topics  . Alcohol use: Yes    Comment: occasional    Family History  Problem Relation Age of Onset  . Stroke Mother   . Diabetes Mother   . Hypertension Mother   . Lung cancer Mother   . Sickle cell trait Mother   . Breast cancer Maternal Aunt   . Diabetes Maternal Aunt   . Lung cancer Maternal Aunt   .  Lung cancer Maternal Aunt   . Lung cancer Maternal Aunt   . Colon cancer Cousin 44  . Sickle cell trait Father   . Sickle cell anemia Sister 35  . Lung cancer Maternal Grandmother   . Sickle cell anemia Sister   . Other Sister        accident and blood clot formed    Review of Systems  Constitutional: Positive for malaise/fatigue. Negative for chills and fever.  Respiratory: Negative for cough and shortness of breath.   Cardiovascular: Negative for chest pain, palpitations and leg swelling.  Gastrointestinal: Positive for abdominal pain and nausea. Negative for blood in stool, constipation, diarrhea and vomiting.  Musculoskeletal: Positive for joint pain and myalgias.  Skin: Positive for rash.  Neurological: Positive for dizziness, focal weakness and headaches.     OBJECTIVE:  Temperature 97.7 F (36.5 C), temperature source Oral, height 5' 3"  (1.6 m), weight 161 lb 12.8 oz (73.4 kg), SpO2  95 %. Body mass index is 28.66 kg/m.   Lying: 162/84, 90 Sitting: 147/97, 95 Standing: 122/74, 97  BP Readings from Last 3 Encounters:  04/12/18 (!) 154/88  03/09/18 (!) 142/89  02/04/18 120/78    Physical Exam  Constitutional: She is oriented to person, place, and time. She appears well-developed and well-nourished.  HENT:  Head: Normocephalic and atraumatic.  Mouth/Throat: Oropharynx is clear and moist. No oropharyngeal exudate.  Eyes: Pupils are equal, round, and reactive to light. Conjunctivae and EOM are normal. No scleral icterus.  Neck: Neck supple.  Cardiovascular: Normal rate, regular rhythm and normal heart sounds. Exam reveals no gallop and no friction rub.  No murmur heard. Pulmonary/Chest: Effort normal and breath sounds normal. She has no wheezes. She has no rales.  Musculoskeletal: She exhibits no edema.  Neurological: She is alert and oriented to person, place, and time.  Skin: Skin is warm and dry.  Psychiatric: She has a normal mood and affect.  Nursing note and vitals reviewed.   ASSESSMENT and PLAN  1. Vertigo Very postural in nature, when she lies down. Already getting better. Discussed supportive measures, new meds r/se/b and RTC precautions.  2. Elevated CK 3. Myalgia 4. Polyarthritis Patient continues to refer to "UC flareups" but has no diarrhea, she does have UE pain and weakness, peeling of her fingers and soreness in her mouth...labs to r/o myositis or dermatomyositis. Consider neuro referral.  - Lactate Dehydrogenase - Aldolase - ANA,IFA RA Diag Pnl w/rflx Tit/Patn - Myositis Panel III  5. Excessive daytime sleepiness Concerning for OSA with morning headaches, sleepiness, snoring and elevated BP, referring to sleep medicine - Ambulatory referral to Sleep Studies  6. Essential hypertension, benign New diagnosis, starting amlodipine, new med r/se/b reviewed 7. Need for influenza vaccination - Flu Vaccine QUAD 36+ mos IM  Other orders -  meclizine (ANTIVERT) 25 MG tablet; Take 1 tablet (25 mg total) by mouth 3 (three) times daily as needed for dizziness. - amLODipine (NORVASC) 5 MG tablet; Take 1 tablet (5 mg total) by mouth daily.  Return in about 4 weeks (around 06/04/2018).    Rutherford Guys, MD Primary Care at [place], [place] [address] Ph.  [phone] Fax [phone]

## 2018-05-07 NOTE — Patient Instructions (Signed)
° ° ° °  If you have lab work done today you will be contacted with your lab results within the next 2 weeks.  If you have not heard from us then please contact us. The fastest way to get your results is to register for My Chart. ° ° °IF you received an x-ray today, you will receive an invoice from [facility]. Please contact [facility] at [phone] with questions or concerns regarding your invoice.  ° °IF you received labwork today, you will receive an invoice from [facility]. Please contact [facility] at [phone] with questions or concerns regarding your invoice.  ° °Our billing staff will not be able to assist you with questions regarding bills from these companies. ° °You will be contacted with the lab results as soon as they are available. The fastest way to get your results is to activate your My Chart account. Instructions are located on the last page of this paperwork. If you have not heard from us regarding the results in 2 weeks, please contact this office. °  ° ° ° °

## 2018-05-11 NOTE — Addendum Note (Signed)
Addended by: Rutherford Guys on: 05/11/2018 10:49 AM   Modules accepted: Orders

## 2018-05-18 DIAGNOSIS — G5602 Carpal tunnel syndrome, left upper limb: Secondary | ICD-10-CM | POA: Diagnosis not present

## 2018-05-18 DIAGNOSIS — G5601 Carpal tunnel syndrome, right upper limb: Secondary | ICD-10-CM | POA: Diagnosis not present

## 2018-05-20 ENCOUNTER — Telehealth: Payer: Self-pay | Admitting: *Deleted

## 2018-05-20 NOTE — Telephone Encounter (Signed)
Left message to call back Patient was referred to neurology

## 2018-05-21 LAB — ANA,IFA RA DIAG PNL W/RFLX TIT/PATN
ANA Titer 1: NEGATIVE
Cyclic Citrullin Peptide Ab: 7 units (ref 0–19)
Rhuematoid fact SerPl-aCnc: <10 IU/mL (ref 0.0–13.9)

## 2018-05-21 LAB — MYOSITIS PANEL III: RNP: 2.3 EU/ml

## 2018-05-21 LAB — ALDOLASE: Aldolase: 11.8 U/L — ABNORMAL HIGH (ref 3.3–10.3)

## 2018-05-21 LAB — LACTATE DEHYDROGENASE: LDH: 253 IU/L — ABNORMAL HIGH (ref 119–226)

## 2018-06-03 ENCOUNTER — Ambulatory Visit: Payer: Medicare Other | Admitting: Family Medicine

## 2018-06-08 ENCOUNTER — Encounter

## 2018-06-08 ENCOUNTER — Ambulatory Visit: Payer: PRIVATE HEALTH INSURANCE | Admitting: Internal Medicine

## 2018-06-18 ENCOUNTER — Ambulatory Visit: Payer: Self-pay | Admitting: *Deleted

## 2018-06-18 ENCOUNTER — Encounter

## 2018-06-18 NOTE — Telephone Encounter (Signed)
Contacted pt regarding symptoms; shehas been having headaches for 3 day and neck pain and trouble moving her neck the pain is radiating down her spine toback; her back/spine pain started about a week ago' recommendations made per protocol; the pt normally sees Dr Alta Corning but she has no availability per guidelines; pt offered and accepted appointment with Dr Carlota Raspberry, [place] [facility], on 06/19/18 at 1040; she verbalizes understanding.  Reason for Disposition . [1] Age > 72 AND [2] no history of prior similar back pain  Answer Assessment - Initial Assessment Questions 1. ONSET: "When did the pain begin?"      06/11/18 2. LOCATION: "Where does it hurt?" (upper, mid or lower back)     Middle of back/spine 3. SEVERITY: "How bad is the pain?"  (e.g., Scale 1-10; mild, moderate, or severe)   - MILD (1-3): doesn't interfere with normal activities    - MODERATE (4-7): interferes with normal activities or awakens from sleep    - SEVERE (8-10): excruciating pain, unable to do any normal activities     Severe rated 8 out of 10 4. PATTERN: "Is the pain constant?" (e.g., yes, no; constant, intermittent)      contsant 5. RADIATION: "Does the pain shoot into your legs or elsewhere?"     Neck; headaches in back of head; occasionally hard to move neck due to pain  6. CAUSE:  "What do you think is causing the back pain?"      No clue 7. BACK OVERUSE:  "Any recent lifting of heavy objects, strenuous work or exercise?"     no 8. MEDICATIONS: "What have you taken so far for the pain?" (e.g., nothing, acetaminophen, NSAIDS)     nothing 9. NEUROLOGIC SYMPTOMS: "Do you have any weakness, numbness, or problems with bowel/bladder control?"     no 10. OTHER SYMPTOMS: "Do you have any other symptoms?" (e.g., fever, abdominal pain, burning with urination, blood in urine)       no 11. PREGNANCY: "Is there any chance you are pregnant?" (e.g., yes, no; LMP)      No partial hysterectomy  Protocols used: BACK  PAIN-A-AH

## 2018-06-19 ENCOUNTER — Ambulatory Visit (INDEPENDENT_AMBULATORY_CARE_PROVIDER_SITE_OTHER): Payer: Medicare Other | Admitting: Family Medicine

## 2018-06-19 ENCOUNTER — Encounter: Payer: Self-pay | Admitting: Family Medicine

## 2018-06-19 ENCOUNTER — Other Ambulatory Visit: Payer: Self-pay

## 2018-06-19 VITALS — BP 143/82 | HR 67 | Temp 98.2°F | Resp 18 | Ht 63.0 in | Wt 162.4 lb

## 2018-06-19 DIAGNOSIS — M549 Dorsalgia, unspecified: Secondary | ICD-10-CM | POA: Diagnosis not present

## 2018-06-19 DIAGNOSIS — M62838 Other muscle spasm: Secondary | ICD-10-CM

## 2018-06-19 DIAGNOSIS — G8929 Other chronic pain: Secondary | ICD-10-CM

## 2018-06-19 DIAGNOSIS — G44209 Tension-type headache, unspecified, not intractable: Secondary | ICD-10-CM | POA: Diagnosis not present

## 2018-06-19 MED ORDER — CYCLOBENZAPRINE HCL 5 MG PO TABS: 5.0000 mg | ORAL_TABLET | Freq: Three times a day (TID) | ORAL | 0 refills | Status: DC | PRN

## 2018-06-19 NOTE — Patient Instructions (Addendum)
It appears that the chronic back pain may have flared with muscle spasm which also appears to be causing the spasm of the muscles of the neck and likely tension headache.  See information below on back pain and muscle spasms as well as tension headache.  Flexeril one half up to 2 pills every 8 hours as needed.  Continue tylenol as needed. Heat or ice to spasm. Watch for sedation or dizziness with flexeril. Recheck in the next 5 days if not starting to improve, sooner if worse.  I will also refer you to ortho for ongoing care of your back.   Return to the clinic or go to the nearest emergency room if any of your symptoms worsen or new symptoms occur.    General Headache Without Cause A headache is pain or discomfort felt around the head or neck area. There are many causes and types of headaches. In some cases, the cause may not be found. Follow these instructions at home: Managing pain  Take over-the-counter and prescription medicines only as told by your doctor.  Lie down in a dark, quiet room when you have a headache.  If directed, apply ice to the head and neck area: ? Put ice in a plastic bag. ? Place a towel between your skin and the bag. ? Leave the ice on for 20 minutes, 2-3 times per day.  Use a heating pad or hot shower to apply heat to the head and neck area as told by your doctor.  Keep lights dim if bright lights bother you or make your headaches worse. Eating and drinking  Eat meals on a regular schedule.  Lessen how much alcohol you drink.  Lessen how much caffeine you drink, or stop drinking caffeine. General instructions  Keep all follow-up visits as told by your doctor. This is important.  Keep a journal to find out if certain things bring on headaches. For example, write down: ? What you eat and drink. ? How much sleep you get. ? Any change to your diet or medicines.  Relax by getting a massage or doing other relaxing activities.  Lessen stress.  Sit up  straight. Do not tighten (tense) your muscles.  Do not use tobacco products. This includes cigarettes, chewing tobacco, or e-cigarettes. If you need help quitting, ask your doctor.  Exercise regularly as told by your doctor.  Get enough sleep. This often means 7-9 hours of sleep. Contact a doctor if:  Your symptoms are not helped by medicine.  You have a headache that feels different than the other headaches.  You feel sick to your stomach (nauseous) or you throw up (vomit).  You have a fever. Get help right away if:  Your headache becomes really bad.  You keep throwing up.  You have a stiff neck.  You have trouble seeing.  You have trouble speaking.  You have pain in the eye or ear.  Your muscles are weak or you lose muscle control.  You lose your balance or have trouble walking.  You feel like you will pass out (faint) or you pass out.  You have confusion. This information is not intended to replace advice given to you by your health care provider. Make sure you discuss any questions you have with your health care provider. Document Released: 06/03/2008 Document Revised: 01/31/2016 Document Reviewed: 12/18/2014 Elsevier Interactive Patient Education  2018 [employer].   Muscle Cramps and Spasms Muscle cramps and spasms are when muscles tighten by themselves. They usually  get better within minutes. Muscle cramps are painful. They are usually stronger and last longer than muscle spasms. Muscle spasms may or may not be painful. They can last a few seconds or much longer. Follow these instructions at home:  Drink enough fluid to keep your pee (urine) clear or pale yellow.  Massage, stretch, and relax the muscle.  If directed, apply heat to tight or tense muscles as often as told by your doctor. Use the heat source that your doctor recommends. ? Place a towel between your skin and the heat source. ? Leave the heat on for 20-30 minutes. ? Take off the heat if your  skin turns bright red. This is especially important if you are unable to feel pain, heat, or cold. You may have a greater risk of getting burned.  If directed, put ice on the affected area. This may help if you are sore or have pain after a cramp or spasm. ? Put ice in a plastic bag. ? Place a towel between your skin and the bag. ? Leave the ice on for 20 minutes, 2-3 times a day.  Take over-the-counter and prescription medicines only as told by your doctor.  Pay attention to any changes in your symptoms. Contact a doctor if:  Your cramps or spasms get worse or happen more often.  Your cramps or spasms do not get better with time. This information is not intended to replace advice given to you by your health care provider. Make sure you discuss any questions you have with your health care provider. Document Released: 08/07/2008 Document Revised: 09/26/2015 Document Reviewed: 05/29/2015 Elsevier Interactive Patient Education  2018 [employer].  Back Pain, Adult Many adults have back pain from time to time. Common causes of back pain include:  A strained muscle or ligament.  Wear and tear (degeneration) of the spinal disks.  Arthritis.  A hit to the back.  Back pain can be short-lived (acute) or last a long time (chronic). A physical exam, lab tests, and imaging studies may be done to find the cause of your pain. Follow these instructions at home: Managing pain and stiffness  Take over-the-counter and prescription medicines only as told by your health care provider.  If directed, apply heat to the affected area as often as told by your health care provider. Use the heat source that your health care provider recommends, such as a moist heat pack or a heating pad. ? Place a towel between your skin and the heat source. ? Leave the heat on for 20-30 minutes. ? Remove the heat if your skin turns bright red. This is especially important if you are unable to feel pain, heat, or cold.  You have a greater risk of getting burned.  If directed, apply ice to the injured area: ? Put ice in a plastic bag. ? Place a towel between your skin and the bag. ? Leave the ice on for 20 minutes, 2-3 times a day for the first 2-3 days. Activity  Do not stay in bed. Resting more than 1-2 days can delay your recovery.  Take short walks on even surfaces as soon as you are able. Try to increase the length of time you walk each day.  Do not sit, drive, or stand in one place for more than 30 minutes at a time. Sitting or standing for long periods of time can put stress on your back.  Use proper lifting techniques. When you bend and lift, use positions that put  less stress on your back: ? Bend your knees. ? Keep the load close to your body. ? Avoid twisting.  Exercise regularly as told by your health care provider. Exercising will help your back heal faster. This also helps prevent back injuries by keeping muscles strong and flexible.  Your health care provider may recommend that you see a physical therapist. This person can help you come up with a safe exercise program. Do any exercises as told by your physical therapist. Lifestyle  Maintain a healthy weight. Extra weight puts stress on your back and makes it difficult to have good posture.  Avoid activities or situations that make you feel anxious or stressed. Learn ways to manage anxiety and stress. One way to manage stress is through exercise. Stress and anxiety increase muscle tension and can make back pain worse. General instructions  Sleep on a firm mattress in a comfortable position. Try lying on your side with your knees slightly bent. If you lie on your back, put a pillow under your knees.  Follow your treatment plan as told by your health care provider. This may include: ? Cognitive or behavioral therapy. ? Acupuncture or massage therapy. ? Meditation or yoga. Contact a health care provider if:  You have pain that is not  relieved with rest or medicine.  You have increasing pain going down into your legs or buttocks.  Your pain does not improve in 2 weeks.  You have pain at night.  You lose weight.  You have a fever or chills. Get help right away if:  You develop new bowel or bladder control problems.  You have unusual weakness or numbness in your arms or legs.  You develop nausea or vomiting.  You develop abdominal pain.  You feel faint. Summary  Many adults have back pain from time to time. A physical exam, lab tests, and imaging studies may be done to find the cause of your pain.  Use proper lifting techniques. When you bend and lift, use positions that put less stress on your back.  Take over-the-counter and prescription medicines and apply heat or ice as directed by your health care provider. This information is not intended to replace advice given to you by your health care provider. Make sure you discuss any questions you have with your health care provider. Document Released: 08/25/2005 Document Revised: 09/29/2016 Document Reviewed: 09/29/2016 Elsevier Interactive Patient Education  [employer].    If you have lab work done today you will be contacted with your lab results within the next 2 weeks.  If you have not heard from [place] then please contact us. The fastest way to get your results is to register for My Chart.   IF you received an x-ray today, you will receive an invoice from [facility]. Please contact [facility] at [phone] with questions or concerns regarding your invoice.   IF you received labwork today, you will receive an invoice from [facility]. Please contact [facility] at [phone] with questions or concerns regarding your invoice.   Our billing staff will not be able to assist you with questions regarding bills from these companies.  You will be contacted with the lab results as soon as they are available. The fastest way to get your  results is to activate your My Chart account. Instructions are located on the last page of this paperwork. If you have not heard from [place] regarding the results in 2 weeks, please contact this office.

## 2018-06-19 NOTE — Progress Notes (Signed)
Subjective:    Patient ID: Beverly Ramirez, female    DOB: Jul 03, 1961, 57 y.o.   MRN: [id number]  HPI Beverly Ramirez is a 57 y.o. female Presents today for: Chief Complaint  Patient presents with  . Back Pain    x1 week; states her entire back hurts  . Headache    states her head pain started after the back pain; pain raidates to her neck   Pain in back past week.  No recent injury.  Has had back pain flares in past treated with antiinflammatories and muscle relaxers.  Has helped in past, but not taken recently.  Pain in entire back - more lower, but now in upper and base of neck with some HA in back of neck sore past 3 days. No fever, unexplained wt loss,  no recent illness.  No new urinary symptoms - no blood in urine. No hx of kidney stones.  No bowel or bladder incontinence, no saddle anesthesia, no lower extremity weakness.   No recent activity changes, on disability for prior back injury. Moved here from [place] 1 year ago, followed by pain mgt in past, but no recent daily meds for back, no local back specialist. Flares of back pain once per month.   Hx of UC, had been recommended to avoid NSAIDS.   Tx: tylenol, heating pad.    Patient Active Problem List   Diagnosis Date Noted  . Leukocytosis 10/04/2017  . Hyperlipidemia 08/04/2017  . Ulcerative colitis without complications ([place]) 57/32/2025  . Plantar fasciitis 05/28/2017   Past Medical History:  Diagnosis Date  . Anemia   . Carpal tunnel syndrome    bilateral  . Chronic heel pain   . DJD (degenerative joint disease) of cervical spine    MRI 2017  . Hyperlipidemia   . Plantar fasciitis    bilateral  . UC (ulcerative colitis) ([place])    Past Surgical History:  Procedure Laterality Date  . ABDOMINAL HYSTERECTOMY    . APPENDECTOMY    . CESAREAN SECTION     x4  . COLONOSCOPY     Multiple in [place]  . ESOPHAGOGASTRODUODENOSCOPY     Allergies  Allergen Reactions  . Codeine Itching, Rash and Hives  .  Latex Hives    With the power   Prior to Admission medications   Medication Sig Start Date End Date Taking? Authorizing Provider  amLODipine (NORVASC) 5 MG tablet Take 1 tablet (5 mg total) by mouth daily. 05/07/18  Yes Rutherford Guys, MD  ezetimibe (ZETIA) 10 MG tablet Take 1 tablet (10 mg total) by mouth daily. 04/12/18  Yes Rutherford Guys, MD  mesalamine (LIALDA) 1.2 g EC tablet Take 4 tablets (4.8 g total) by mouth daily with breakfast. 04/12/18 07/11/18 Yes Rutherford Guys, MD   Social History   Socioeconomic History  . Marital status: Married    Spouse name: Simona Huh  . Number of children: 4  . Years of education: Not on file  . Highest education level: Not on file  Occupational History  . Occupation: retired    Comment: He formerly worked for [employer], disabled due to a fall  Social Needs  . Financial resource strain: Not on file  . Food insecurity:    Worry: Not on file    Inability: Not on file  . Transportation needs:    Medical: Not on file    Non-medical: Not on file  Tobacco Use  . Smoking status:  Former Smoker    Types: Cigarettes    Last attempt to quit: 10/13/2006    Years since quitting: 11.6  . Smokeless tobacco: Never Used  Substance and Sexual Activity  . Alcohol use: Yes    Comment: occasional  . Drug use: No  . Sexual activity: Yes    Partners: Male    Comment: 1ST intercourse- 29, partners- 6, married- 16 yrs   Lifestyle  . Physical activity:    Days per week: Not on file    Minutes per session: Not on file  . Stress: Not on file  Relationships  . Social connections:    Talks on phone: Not on file    Gets together: Not on file    Attends religious service: Not on file    Active member of club or organization: Not on file    Attends meetings of clubs or organizations: Not on file    Relationship status: Not on file  . Intimate partner violence:    Fear of current or ex partner: Not on file    Emotionally abused: Not on  file    Physically abused: Not on file    Forced sexual activity: Not on file  Other Topics Concern  . Not on file  Social History Narrative   She is married, 3 sons born 1979, 1981, 1984.  One daughter born in 66.   She is medically retired from the [employer] after a fall and a hip injury, around 2004.   Caffeinated beverage every other day x1   07/13/2017    Review of Systems Per hpi.     Objective:   Physical Exam  Constitutional: She is oriented to person, place, and time. She appears well-developed and well-nourished. No distress.  HENT:  Head: Normocephalic and atraumatic.  Cardiovascular: Normal rate.  Pulmonary/Chest: Effort normal.  Musculoskeletal:  Somewhat guarded, but negative seated straight leg raise.  Diffusely tender paraspinals of lumbar, thoracic and paraspinals of cervical spine.  Slight tenderness of SCM muscles.  Discomfort with paraspinal musculature with neck range of motion.   Neurological: She is alert and oriented to person, place, and time.  Skin: Skin is warm and dry. No rash noted.  Psychiatric: She has a normal mood and affect.  Vitals reviewed.  nonfocal neuro exam.   Vitals:   06/19/18 1104  BP: (!) 143/82  Pulse: 67  Resp: 18  Temp: 98.2 F (36.8 C)  TempSrc: Oral  SpO2: 98%  Weight: 162 lb 6.4 oz (73.7 kg)  Height: 5' 3"  (1.6 m)       Assessment & Plan:    Beverly Ramirez is a 56 y.o. female Bilateral back pain, unspecified back location, unspecified chronicity - Plan: cyclobenzaprine (FLEXERIL) 5 MG tablet  Tension headache - Plan: cyclobenzaprine (FLEXERIL) 5 MG tablet  Muscle spasm - Plan: cyclobenzaprine (FLEXERIL) 5 MG tablet  Chronic back pain, unspecified back location, unspecified back pain laterality - Plan: Ambulatory referral to Orthopedic Surgery  Suspected chronic back pain with flare, secondary spasm.  No red flags or concerning symptoms on exam/history.  Suspected tension headache  with spasms of back and neck.  -Flexeril prescribed with potential side effects/risk discussed, lowest effective dose.  -Refer to orthopedics due to history of back pain previously [place] of state.  -Handout given on muscle spasm and headache, but RTC precautions discussed with any acute worsening, or if not improving within the next 5 days.   Meds ordered this  encounter  Medications  . cyclobenzaprine (FLEXERIL) 5 MG tablet    Sig: Take 1-2 tablets (5-10 mg total) by mouth 3 (three) times daily as needed.    Dispense:  15 tablet    Refill:  0   Patient Instructions   It appears that the chronic back pain may have flared with muscle spasm which also appears to be causing the spasm of the muscles of the neck and likely tension headache.  See information below on back pain and muscle spasms as well as tension headache.  Flexeril one half up to 2 pills every 8 hours as needed.  Continue tylenol as needed. Heat or ice to spasm. Watch for sedation or dizziness with flexeril. Recheck in the next 5 days if not starting to improve, sooner if worse.  I will also refer you to ortho for ongoing care of your back.   Return to the clinic or go to the nearest emergency room if any of your symptoms worsen or new symptoms occur.    General Headache Without Cause A headache is pain or discomfort felt around the head or neck area. There are many causes and types of headaches. In some cases, the cause may not be found. Follow these instructions at home: Managing pain  Take over-the-counter and prescription medicines only as told by your doctor.  Lie down in a dark, quiet room when you have a headache.  If directed, apply ice to the head and neck area: ? Put ice in a plastic bag. ? Place a towel between your skin and the bag. ? Leave the ice on for 20 minutes, 2-3 times per day.  Use a heating pad or hot shower to apply heat to the head and neck area as told by your doctor.  Keep  lights dim if bright lights bother you or make your headaches worse. Eating and drinking  Eat meals on a regular schedule.  Lessen how much alcohol you drink.  Lessen how much caffeine you drink, or stop drinking caffeine. General instructions  Keep all follow-up visits as told by your doctor. This is important.  Keep a journal to find out if certain things bring on headaches. For example, write down: ? What you eat and drink. ? How much sleep you get. ? Any change to your diet or medicines.  Relax by getting a massage or doing other relaxing activities.  Lessen stress.  Sit up straight. Do not tighten (tense) your muscles.  Do not use tobacco products. This includes cigarettes, chewing tobacco, or e-cigarettes. If you need help quitting, ask your doctor.  Exercise regularly as told by your doctor.  Get enough sleep. This often means 7-9 hours of sleep. Contact a doctor if:  Your symptoms are not helped by medicine.  You have a headache that feels different than the other headaches.  You feel sick to your stomach (nauseous) or you throw up (vomit).  You have a fever. Get help right away if:  Your headache becomes really bad.  You keep throwing up.  You have a stiff neck.  You have trouble seeing.  You have trouble speaking.  You have pain in the eye or ear.  Your muscles are weak or you lose muscle control.  You lose your balance or have trouble walking.  You feel like you will pass out (faint) or you pass out.  You have confusion. This information is not intended to replace advice given to you by your health care provider. Make  sure you discuss any questions you have with your health care provider. Document Released: 06/03/2008 Document Revised: 01/31/2016 Document Reviewed: 12/18/2014 Elsevier Interactive Patient Education  2018 [employer].   Muscle Cramps and Spasms Muscle cramps and spasms are when muscles tighten by themselves. They usually get  better within minutes. Muscle cramps are painful. They are usually stronger and last longer than muscle spasms. Muscle spasms may or may not be painful. They can last a few seconds or much longer. Follow these instructions at home:  Drink enough fluid to keep your pee (urine) clear or pale yellow.  Massage, stretch, and relax the muscle.  If directed, apply heat to tight or tense muscles as often as told by your doctor. Use the heat source that your doctor recommends. ? Place a towel between your skin and the heat source. ? Leave the heat on for 20-30 minutes. ? Take off the heat if your skin turns bright red. This is especially important if you are unable to feel pain, heat, or cold. You may have a greater risk of getting burned.  If directed, put ice on the affected area. This may help if you are sore or have pain after a cramp or spasm. ? Put ice in a plastic bag. ? Place a towel between your skin and the bag. ? Leave the ice on for 20 minutes, 2-3 times a day.  Take over-the-counter and prescription medicines only as told by your doctor.  Pay attention to any changes in your symptoms. Contact a doctor if:  Your cramps or spasms get worse or happen more often.  Your cramps or spasms do not get better with time. This information is not intended to replace advice given to you by your health care provider. Make sure you discuss any questions you have with your health care provider. Document Released: 08/07/2008 Document Revised: 09/26/2015 Document Reviewed: 05/29/2015 Elsevier Interactive Patient Education  2018 [employer].  Back Pain, Adult Many adults have back pain from time to time. Common causes of back pain include:  A strained muscle or ligament.  Wear and tear (degeneration) of the spinal disks.  Arthritis.  A hit to the back.  Back pain can be short-lived (acute) or last a long time (chronic). A physical exam, lab tests, and imaging studies may be done to find the  cause of your pain. Follow these instructions at home: Managing pain and stiffness  Take over-the-counter and prescription medicines only as told by your health care provider.  If directed, apply heat to the affected area as often as told by your health care provider. Use the heat source that your health care provider recommends, such as a moist heat pack or a heating pad. ? Place a towel between your skin and the heat source. ? Leave the heat on for 20-30 minutes. ? Remove the heat if your skin turns bright red. This is especially important if you are unable to feel pain, heat, or cold. You have a greater risk of getting burned.  If directed, apply ice to the injured area: ? Put ice in a plastic bag. ? Place a towel between your skin and the bag. ? Leave the ice on for 20 minutes, 2-3 times a day for the first 2-3 days. Activity  Do not stay in bed. Resting more than 1-2 days can delay your recovery.  Take short walks on even surfaces as soon as you are able. Try to increase the length of time you walk each  day.  Do not sit, drive, or stand in one place for more than 30 minutes at a time. Sitting or standing for long periods of time can put stress on your back.  Use proper lifting techniques. When you bend and lift, use positions that put less stress on your back: ? [place] your knees. ? Keep the load close to your body. ? Avoid twisting.  Exercise regularly as told by your health care provider. Exercising will help your back heal faster. This also helps prevent back injuries by keeping muscles strong and flexible.  Your health care provider may recommend that you see a physical therapist. This person can help you come up with a safe exercise program. Do any exercises as told by your physical therapist. Lifestyle  Maintain a healthy weight. Extra weight puts stress on your back and makes it difficult to have good posture.  Avoid activities or situations that make you feel anxious or  stressed. Learn ways to manage anxiety and stress. One way to manage stress is through exercise. Stress and anxiety increase muscle tension and can make back pain worse. General instructions  Sleep on a firm mattress in a comfortable position. Try lying on your side with your knees slightly bent. If you lie on your back, put a pillow under your knees.  Follow your treatment plan as told by your health care provider. This may include: ? Cognitive or behavioral therapy. ? Acupuncture or massage therapy. ? Meditation or yoga. Contact a health care provider if:  You have pain that is not relieved with rest or medicine.  You have increasing pain going down into your legs or buttocks.  Your pain does not improve in 2 weeks.  You have pain at night.  You lose weight.  You have a fever or chills. Get help right away if:  You develop new bowel or bladder control problems.  You have unusual weakness or numbness in your arms or legs.  You develop nausea or vomiting.  You develop abdominal pain.  You feel faint. Summary  Many adults have back pain from time to time. A physical exam, lab tests, and imaging studies may be done to find the cause of your pain.  Use proper lifting techniques. When you bend and lift, use positions that put less stress on your back.  Take over-the-counter and prescription medicines and apply heat or ice as directed by your health care provider. This information is not intended to replace advice given to you by your health care provider. Make sure you discuss any questions you have with your health care provider. Document Released: 08/25/2005 Document Revised: 09/29/2016 Document Reviewed: 09/29/2016 Elsevier Interactive Patient Education  [employer].    If you have lab work done today you will be contacted with your lab results within the next 2 weeks.  If you have not heard from [place] then please contact us. The fastest way to get your results is to  register for My Chart.   IF you received an x-ray today, you will receive an invoice from [facility]. Please contact [facility] at [phone] with questions or concerns regarding your invoice.   IF you received labwork today, you will receive an invoice from [facility]. Please contact [facility] at [phone] with questions or concerns regarding your invoice.   Our billing staff will not be able to assist you with questions regarding bills from these companies.  You will be contacted with the lab results as soon as they are  available. The fastest way to get your results is to activate your My Chart account. Instructions are located on the last page of this paperwork. If you have not heard from [place] regarding the results in 2 weeks, please contact this office.       Signed,   Merri Ray, MD [facility].  06/21/18 12:33 PM

## 2018-07-04 ENCOUNTER — Other Ambulatory Visit: Payer: Self-pay | Admitting: Family Medicine

## 2018-07-05 NOTE — Telephone Encounter (Signed)
Requested medication (s) are due for refill today: yes  Requested medication (s) are on the active medication list: yes  Last refill:  04/12/18  Future visit scheduled: no  Notes to clinic:  Elevated WBC on 04/12/18.  Med not delegated.  Requested Prescriptions  Pending Prescriptions Disp Refills   mesalamine (LIALDA) 1.2 g EC tablet [Pharmacy Med Name: MESALAMINE DR 1.2 GM TABLET] 360 tablet 0    Sig: TAKE 4 TABLETS BY MOUTH DAILY WITH BREAKFAST.     Not Delegated - Gastroenterology:  Inflammatory Bowel Disease (Oral) Failed - 07/04/2018  1:13 AM      Failed - This refill cannot be delegated      Failed - WBC in normal range and within 180 days    WBC  Date Value Ref Range Status  04/12/2018 12.6 (H) 3.4 - 10.8 x10E3/uL Final  09/23/2017 12.6 (H) 3.9 - 10.3 K/uL Final         Passed - Cr in normal range and within 180 days    Creatinine  Date Value Ref Range Status  09/23/2017 0.78 0.60 - 1.10 mg/dL Final   Creatinine, Ser  Date Value Ref Range Status  04/12/2018 0.87 0.57 - 1.00 mg/dL Final         Passed - AST in normal range and within 180 days    AST  Date Value Ref Range Status  04/12/2018 22 0 - 40 IU/L Final  09/23/2017 17 5 - 34 U/L Final         Passed - ALT in normal range and within 180 days    ALT  Date Value Ref Range Status  04/12/2018 30 0 - 32 IU/L Final  09/23/2017 23 0 - 55 U/L Final         Passed - HGB in normal range and within 180 days    Hemoglobin  Date Value Ref Range Status  04/12/2018 12.3 11.1 - 15.9 g/dL Final         Passed - HCT in normal range and within 180 days    Hematocrit  Date Value Ref Range Status  04/12/2018 36.5 34.0 - 46.6 % Final         Passed - PLT in normal range and within 180 days    Platelets  Date Value Ref Range Status  04/12/2018 359 150 - 450 x10E3/uL Final         Passed - Valid encounter within last 6 months    Recent Outpatient Visits          2 weeks ago Bilateral back pain, unspecified  back location, unspecified chronicity   [facility], Ranell Patrick, MD   1 month ago Vertigo   [facility], Lilia Argue, MD   2 months ago Ulcerative colitis without complications, unspecified location [facility])   [facility], Lilia Argue, MD   3 months ago Nausea and vomiting, intractability of vomiting not specified, unspecified vomiting type   [facility], Lilia Argue, MD   5 months ago Myalgia   [facility], Lilia Argue, MD

## 2018-07-13 ENCOUNTER — Encounter (INDEPENDENT_AMBULATORY_CARE_PROVIDER_SITE_OTHER): Payer: Self-pay | Admitting: Orthopaedic Surgery

## 2018-07-13 ENCOUNTER — Ambulatory Visit (INDEPENDENT_AMBULATORY_CARE_PROVIDER_SITE_OTHER): Payer: Medicare Other | Admitting: Orthopaedic Surgery

## 2018-07-13 ENCOUNTER — Ambulatory Visit (INDEPENDENT_AMBULATORY_CARE_PROVIDER_SITE_OTHER): Payer: Medicare Other

## 2018-07-13 VITALS — BP 124/85 | HR 67 | Ht 62.5 in | Wt 164.0 lb

## 2018-07-13 DIAGNOSIS — M545 Low back pain, unspecified: Secondary | ICD-10-CM

## 2018-07-13 DIAGNOSIS — M4722 Other spondylosis with radiculopathy, cervical region: Secondary | ICD-10-CM

## 2018-07-13 DIAGNOSIS — M542 Cervicalgia: Secondary | ICD-10-CM

## 2018-07-13 NOTE — Progress Notes (Signed)
Office Visit Note   Patient: Beverly Ramirez           Date of Birth: 06-25-1961           MRN: [id number] Visit Date: 07/13/2018              Requested by: Wendie Agreste, MD [address] [place], [place] [address] PCP: Rutherford Guys, MD   Assessment & Plan: Visit Diagnoses:  1. Acute bilateral low back pain without sciatica   2. Other spondylosis with radiculopathy, cervical region     Plan: We will proceed with MRI scan cervical spine to evaluate her for progression of her retrolisthesis and stenosis at C5-6 with previous imaging 2 years ago showing severe foraminal stenosis.  Office follow-up after MRI for review.  Follow-Up Instructions: No follow-ups on file.   Orders:  Orders Placed This Encounter  Procedures  . XR Lumbar Spine 2-3 Views   No orders of the defined types were placed in this encounter.     Procedures: No procedures performed   Clinical Data: No additional findings.   Subjective: Chief Complaint  Patient presents with  . Lower Back - Pain  . Middle Back - Pain    HPI 57 year old female new patient visit with history of chronic back pain and chronic neck pain.  She fell down the stairs 2 separate episodes more than 10 years ago and states she has not worked since that time.  She was treated in [place] had a cervical MRI that showed cervical spondylosis with retrolisthesis and some stenosis with severe foraminal stenosis at C56.  She states she also had an MRI of the lumbar spine that report is not available but she states she had multiple lumbar disc bulges.  One point she is to be on Percocet currently she is taking some Flexeril just at night.  She does have ulcerative colitis also some problems with plantar fasciitis recently had some inserts given to her with more heel pain on the left than right.  She has problems with left side neck pain associated headaches pain with rotation of her neck numbness in her hands that wakes her up at  night.  She had electrical test that showed mild bilateral carpal tunnel syndrome in 2017 and also evidence of C6 radiculopathy.  She denies recent falls.  She ambulates with a limp mostly due to her heel pain.  Review of Systems positive for plantar fasciitis, hyperlipidemia history of leukocytosis followed by hematologist which improved with time.  History of ulcerative colitis.  History of on-the-job fall on the stairs more than 10 years ago she is been disabled she states since that time is no longer working.  Past history of smoking not a current smoker.  14 point review of systems otherwise negative   Objective: Vital Signs: BP 124/85   Pulse 67   Ht 5' 2.5" (1.588 m)   Wt 164 lb (74.4 kg)   BMI 29.52 kg/m   Physical Exam  Constitutional: She is oriented to person, place, and time. She appears well-developed.  HENT:  Head: Normocephalic.  Right Ear: External ear normal.  Left Ear: External ear normal.  Eyes: Pupils are equal, round, and reactive to light.  Neck: No tracheal deviation present. No thyromegaly present.  Cardiovascular: Normal rate.  Pulmonary/Chest: Effort normal.  Abdominal: Soft.  Neurological: She is alert and oriented to person, place, and time.  Skin: Skin is warm and dry.  Psychiatric: She has a normal mood  and affect. Her behavior is normal.    Ortho Exam patient has pain with cervical compression brachial plexus tenderness on the right.  Upper extremity reflexes are 1+ and symmetrical positive Spurling on the right negative shoulder impingement.  Some pain with compression of the carpal tunnel positive Phalen's right and left mild.  Slow stride gait she is able to heel and toe walk for a few steps with heel walking she has significant increase in left heel pain.  No gastrocsoleus atrophy no rash over exposed skin no supraclavicular lymphadenopathy.  Specialty Comments:  No specialty comments available.  Imaging: No results found.   PMFS  History: Patient Active Problem List   Diagnosis Date Noted  . Leukocytosis 10/04/2017  . Hyperlipidemia 08/04/2017  . Ulcerative colitis without complications ([place]) 45/85/9292  . Plantar fasciitis 05/28/2017   Past Medical History:  Diagnosis Date  . Anemia   . Carpal tunnel syndrome    bilateral  . Chronic heel pain   . DJD (degenerative joint disease) of cervical spine    MRI 2017  . Hyperlipidemia   . Plantar fasciitis    bilateral  . UC (ulcerative colitis) ([place])     Family History  Problem Relation Age of Onset  . Stroke Mother   . Diabetes Mother   . Hypertension Mother   . Lung cancer Mother   . Sickle cell trait Mother   . Breast cancer Maternal Aunt   . Diabetes Maternal Aunt   . Lung cancer Maternal Aunt   . Lung cancer Maternal Aunt   . Lung cancer Maternal Aunt   . Colon cancer Cousin 40  . Sickle cell trait Father   . Sickle cell anemia Sister 66  . Lung cancer Maternal Grandmother   . Sickle cell anemia Sister   . Other Sister        accident and blood clot formed    Past Surgical History:  Procedure Laterality Date  . ABDOMINAL HYSTERECTOMY    . APPENDECTOMY    . CESAREAN SECTION     x4  . COLONOSCOPY     Multiple in [place]  . ESOPHAGOGASTRODUODENOSCOPY     Social History   Occupational History  . Occupation: retired    Comment: He formerly worked for [employer], disabled due to a fall  Tobacco Use  . Smoking status: Former Smoker    Types: Cigarettes    Last attempt to quit: 10/13/2006    Years since quitting: 11.7  . Smokeless tobacco: Never Used  Substance and Sexual Activity  . Alcohol use: Yes    Comment: occasional  . Drug use: No  . Sexual activity: Yes    Partners: Male    Comment: 1ST intercourse- 21, partners- 71, married- 11 yrs

## 2018-07-13 NOTE — Addendum Note (Signed)
Addended by: Mal Misty L on: 07/13/2018 02:43 PM   Modules accepted: Orders

## 2018-07-16 ENCOUNTER — Telehealth (INDEPENDENT_AMBULATORY_CARE_PROVIDER_SITE_OTHER): Payer: Self-pay | Admitting: Orthopaedic Surgery

## 2018-07-16 ENCOUNTER — Ambulatory Visit (INDEPENDENT_AMBULATORY_CARE_PROVIDER_SITE_OTHER): Payer: Medicare Other | Admitting: Physician Assistant

## 2018-07-16 ENCOUNTER — Other Ambulatory Visit: Payer: Self-pay

## 2018-07-16 ENCOUNTER — Encounter: Payer: Self-pay | Admitting: Physician Assistant

## 2018-07-16 VITALS — BP 131/90 | HR 104 | Temp 98.3°F | Resp 18 | Ht 62.5 in | Wt 161.8 lb

## 2018-07-16 DIAGNOSIS — R3989 Other symptoms and signs involving the genitourinary system: Secondary | ICD-10-CM | POA: Diagnosis not present

## 2018-07-16 DIAGNOSIS — M545 Low back pain, unspecified: Secondary | ICD-10-CM

## 2018-07-16 LAB — POC MICROSCOPIC URINALYSIS (UMFC): Mucus: ABSENT

## 2018-07-16 LAB — POCT URINALYSIS DIP (MANUAL ENTRY)
Bilirubin, UA: NEGATIVE
Glucose, UA: NEGATIVE mg/dL
Ketones, POC UA: NEGATIVE mg/dL
Leukocytes, UA: NEGATIVE
Nitrite, UA: NEGATIVE
Protein Ur, POC: NEGATIVE mg/dL
SPEC GRAV UA: 1.01 (ref 1.010–1.025)
UROBILINOGEN UA: 1 U/dL
pH, UA: 7 (ref 5.0–8.0)

## 2018-07-16 NOTE — Patient Instructions (Addendum)
Your urine does not have any blood or white blood cells in it, which is reassuring. I will send it off for a culture just in case. This is all consistent with muscle spasms. I suggest taking flexeril as prescribed. You can take 5-76m up to three times a day. Use heating pad to affected area. Follow up with orthopedics as planned. Thank you for letting me participate in your health and well being.   If you have lab work done today you will be contacted with your lab results within the next 2 weeks.  If you have not heard from [place] please contact [place] The fastest way to get your results is to register for My Chart.   IF you received an x-ray today, you will receive an [facility] from [facility]. Please contact [facility] at [phone] questions or concerns regarding your invoice.   IF you received labwork today, you will receive an invoice from [facility] Please contact [facility] at [phone] questions or concerns regarding your invoice.   Our billing staff will not be able to assist you with questions regarding bills from these companies.  You will be contacted with the lab results as soon as they are available. The fastest way to get your results is to activate your My Chart account. Instructions are located on the last page of this paperwork. If you have not heard from [place] the results in 2 weeks, please contact this office.

## 2018-07-16 NOTE — Progress Notes (Signed)
Timmy Sailer-Hines  MRN: [id number] DOB: 10-07-1960  Subjective:  Cristle Grudzien-Hines is a 57 y.o. female seen in office today for a chief complaint of low back pain. Was just seen by orthopedics on 07/13/18 and had lumbar film, which was normal.  Has follow up with him in a few weeks for MRI of back. Today having a new pain, which is higher in the back so wanted to get checked out again. Has been having left sided sharp pain that does not radiate.  Walking and moving makes it worse.  Taking tylenol and muscle relaxer with some relief. Urine has been darker in color (more dark yellow) for the past few days.  Has a little bit more urinary frequency.  Denies hematuria, dysuria, urgency, ab pain, fever, chills, N/V/D. Drinks ~20 oz of water a day. Drinks juices and teas mostly.  No hx of kidney stones. No bowel or bladder incontinence, no saddle anesthesia, no lower extremity weakness.  Review of Systems  Per HPI  Patient Active Problem List   Diagnosis Date Noted  . Leukocytosis 10/04/2017  . Hyperlipidemia 08/04/2017  . Ulcerative colitis without complications ([place]) 09/73/5329  . Plantar fasciitis 05/28/2017    Current Outpatient Medications on File Prior to Visit  Medication Sig Dispense Refill  . amLODipine (NORVASC) 5 MG tablet Take 1 tablet (5 mg total) by mouth daily. 90 tablet 3  . cyclobenzaprine (FLEXERIL) 5 MG tablet Take 1-2 tablets (5-10 mg total) by mouth 3 (three) times daily as needed. 15 tablet 0  . ezetimibe (ZETIA) 10 MG tablet Take 1 tablet (10 mg total) by mouth daily. 90 tablet 1  . mesalamine (LIALDA) 1.2 g EC tablet Take 4 tablets (4.8 g total) by mouth daily with breakfast. 120 tablet 2   No current facility-administered medications on file prior to visit.     Allergies  Allergen Reactions  . Codeine Itching, Rash and Hives  . Latex Hives    With the power     Objective:  BP 131/90   Pulse (!) 104   Temp 98.3 F (36.8 C) (Oral)   Resp 18   Ht 5'  2.5" (1.588 m)   Wt 161 lb 12.8 oz (73.4 kg)   SpO2 98%   BMI 29.12 kg/m   Physical Exam  Constitutional: She is oriented to person, place, and time. She appears well-developed and well-nourished. No distress.  HENT:  Head: Normocephalic and atraumatic.  Eyes: Conjunctivae are normal.  Neck: Normal range of motion.  Pulmonary/Chest: Effort normal.  Musculoskeletal:       Thoracic back: She exhibits decreased range of motion and tenderness. She exhibits no bony tenderness, no swelling and no spasm.       Lumbar back: She exhibits normal range of motion, no tenderness, no bony tenderness and no spasm.       Back:  Neurological: She is alert and oriented to person, place, and time. Gait normal.  Reflex Scores:      Patellar reflexes are 2+ on the right side and 2+ on the left side.      Achilles reflexes are 2+ on the right side and 2+ on the left side. Neg SLR b/l Strength 5/5 of BLE Sensation of BLE intact  Skin: Skin is warm and dry.  Psychiatric: She has a normal mood and affect.  Vitals reviewed.   Results for orders placed or performed in visit on 07/16/18 (from the past 24 hour(s))  POCT urinalysis dipstick  Status: Abnormal   Collection Time: 07/16/18  2:38 PM  Result Value Ref Range   Color, UA yellow yellow   Clarity, UA clear clear   Glucose, UA negative negative mg/dL   Bilirubin, UA negative negative   Ketones, POC UA negative negative mg/dL   Spec Grav, UA 1.010 1.010 - 1.025   Blood, UA trace-intact (A) negative   pH, UA 7.0 5.0 - 8.0   Protein Ur, POC negative negative mg/dL   Urobilinogen, UA 1.0 0.2 or 1.0 E.U./dL   Nitrite, UA Negative Negative   Leukocytes, UA Negative Negative  POCT Microscopic Urinalysis ([facility])     Status: Abnormal   Collection Time: 07/16/18  2:42 PM  Result Value Ref Range   WBC,UR,HPF,POC None None WBC/hpf   RBC,UR,HPF,POC None None RBC/hpf   Bacteria Few (A) None, Too numerous to count   Mucus Absent Absent   Epithelial  Cells, UR Per Microscopy Few (A) None, Too numerous to count cells/hpf     Assessment and Plan :  1. Low back pain, unspecified back pain laterality, unspecified chronicity, unspecified whether sciatica present Patient has reproducible pain in thoracic muscular region.  No CVA tenderness.  No hematuria.  She is afebrile.  Consistent with muscle spasm. Neuro exam normal.  Recommended conservative treatment with rest, heating pad, NSAIDs, muscle relaxants, and stretching as tolerated.  Avoid heavy lifting.  Encouraged patient to follow-up with orthopedics as planned. - POCT urinalysis dipstick - POCT Microscopic Urinalysis ([facility])  2. Abnormal urine color UA negative for nitrites, leukocytes; urine micro negative for WBC and RBC, positive for few bacteria.  Will send for urine culture for further evaluation. Darker color is likely due to decreased water consumption and increased juice/tea consumption. Educated pt to decrease the amt of juice/tea and increase water. Pt reassured.  Advised to return to clinic if symptoms worsen, do not improve, or as needed.  - Urine Culture   Tenna Delaine PA-C  [facility] 07/16/2018 2:44 PM

## 2018-07-16 NOTE — Telephone Encounter (Signed)
Called patient left message on voicemail to call back to schedule and schedule an appointment for MRI review

## 2018-07-17 ENCOUNTER — Encounter: Payer: Self-pay | Admitting: Physician Assistant

## 2018-07-18 LAB — URINE CULTURE

## 2018-07-19 ENCOUNTER — Other Ambulatory Visit: Payer: Self-pay | Admitting: Physician Assistant

## 2018-07-19 ENCOUNTER — Telehealth: Payer: Self-pay | Admitting: Physician Assistant

## 2018-07-19 MED ORDER — NITROFURANTOIN MONOHYD MACRO 100 MG PO CAPS: 100.0000 mg | ORAL_CAPSULE | Freq: Two times a day (BID) | ORAL | 0 refills | Status: AC

## 2018-07-19 NOTE — Telephone Encounter (Signed)
Reviewed lab results and physician's note with patient. All questions answered.

## 2018-07-19 NOTE — Progress Notes (Signed)
Please call pt and report her urine culture did grow ecoli which is likely causing her urinary symptoms, I have sent in an antibiotic for her to pick up from her pharmacy and start today. Please let me know if she has any questions or concerns.

## 2018-07-19 NOTE — Progress Notes (Signed)
LMOM for pt to call the office. Pls advise her of Brittany,PA message regarding lab results and medication sent to her pharmacy. thanks

## 2018-07-20 ENCOUNTER — Encounter: Payer: Self-pay | Admitting: Neurology

## 2018-07-20 ENCOUNTER — Ambulatory Visit (INDEPENDENT_AMBULATORY_CARE_PROVIDER_SITE_OTHER): Payer: Medicare Other | Admitting: Neurology

## 2018-07-20 VITALS — BP 132/89 | HR 75 | Ht 62.0 in | Wt 164.5 lb

## 2018-07-20 DIAGNOSIS — R0683 Snoring: Secondary | ICD-10-CM | POA: Diagnosis not present

## 2018-07-20 DIAGNOSIS — R51 Headache: Secondary | ICD-10-CM | POA: Diagnosis not present

## 2018-07-20 DIAGNOSIS — R0681 Apnea, not elsewhere classified: Secondary | ICD-10-CM | POA: Diagnosis not present

## 2018-07-20 DIAGNOSIS — G4719 Other hypersomnia: Secondary | ICD-10-CM

## 2018-07-20 DIAGNOSIS — E669 Obesity, unspecified: Secondary | ICD-10-CM

## 2018-07-20 DIAGNOSIS — R519 Headache, unspecified: Secondary | ICD-10-CM

## 2018-07-20 DIAGNOSIS — R351 Nocturia: Secondary | ICD-10-CM

## 2018-07-20 NOTE — Patient Instructions (Signed)

## 2018-07-20 NOTE — Progress Notes (Signed)
Subjective:    Patient ID: Beverly Ramirez is a 57 y.o. female.  HPI   Star Age, MD, PhD [facility] [address], [address] [place], [place] [address]  Dear Dr. Pamella Pert,   I saw your patient, Beverly Ramirez, upon your kind request, in my sleep clinic today for initial consultation of her sleep disorder, in particular, concern for underlying obstructive sleep apnea. The patient is unaccompanied today. As you know, [name] is a 57 year old right-handed woman with an underlying medical history of hyperlipidemia, degenerative disc disease, anemia, ulcerative colitis and overweight state, who reports snoring and excessive daytime somnolence. I reviewed your office note from 05/07/2018. She has had recurrent headaches including morning headaches. Her Epworth sleepiness score is 8 out of 24, fatigue score is 15 out of 63. She does snore, she does not wake herself up with gasping, but husband has mentioned pauses in her breathing while she is asleep. She does not sleep well, does not wake up fully rested. Bedtime is around 11 PM, but wakes up in the middle of the night. She wakes up around 6:45 AM, when her daughter leaves for college. She has nocturia about 2-3 times per night. Weight has been stable, has a nephew with OSA.  She does not endorse RLS symptoms. She has a Hx of bronchitis and pneumonia. Quit smoking about 10 years ago, rare EtOH, drinks no daily caffeine. She does not currently work. She lives with her husband and her 34 year old daughter. She has a total of 4 children.   Her Past Medical History Is Significant For: Past Medical History:  Diagnosis Date  . Anemia   . Carpal tunnel syndrome    bilateral  . Chronic heel pain   . DJD (degenerative joint disease) of cervical spine    MRI 2017  . Hyperlipidemia   . Plantar fasciitis    bilateral  . UC (ulcerative colitis) ([place])     Her Past Surgical History Is Significant  For: Past Surgical History:  Procedure Laterality Date  . ABDOMINAL HYSTERECTOMY    . APPENDECTOMY    . CESAREAN SECTION     x4  . COLONOSCOPY     Multiple in [place]  . ESOPHAGOGASTRODUODENOSCOPY      Her Family History Is Significant For: Family History  Problem Relation Age of Onset  . Stroke Mother   . Diabetes Mother   . Hypertension Mother   . Lung cancer Mother   . Sickle cell trait Mother   . Breast cancer Maternal Aunt   . Diabetes Maternal Aunt   . Lung cancer Maternal Aunt   . Lung cancer Maternal Aunt   . Lung cancer Maternal Aunt   . Colon cancer Cousin 81  . Sickle cell trait Father   . Sickle cell anemia Sister 49  . Lung cancer Maternal Grandmother   . Sickle cell anemia Sister   . Other Sister        accident and blood clot formed    Her Social History Is Significant For: Social History   Socioeconomic History  . Marital status: Married    Spouse name: Simona Huh  . Number of children: 4  . Years of education: Not on file  . Highest education level: Not on file  Occupational History  . Occupation: retired    Comment: He formerly worked for [employer], disabled due to a fall  Social Needs  . Financial resource strain: Not on file  .  Food insecurity:    Worry: Not on file    Inability: Not on file  . Transportation needs:    Medical: Not on file    Non-medical: Not on file  Tobacco Use  . Smoking status: Former Smoker    Types: Cigarettes    Last attempt to quit: 10/13/2006    Years since quitting: 11.7  . Smokeless tobacco: Never Used  Substance and Sexual Activity  . Alcohol use: Yes    Comment: occasional  . Drug use: No  . Sexual activity: Yes    Partners: Male    Comment: 1ST intercourse- 88, partners- 27, married- 16 yrs   Lifestyle  . Physical activity:    Days per week: Not on file    Minutes per session: Not on file  . Stress: Not on file  Relationships  . Social connections:    Talks on phone: Not  on file    Gets together: Not on file    Attends religious service: Not on file    Active member of club or organization: Not on file    Attends meetings of clubs or organizations: Not on file    Relationship status: Not on file  Other Topics Concern  . Not on file  Social History Narrative   She is married, 3 sons born 1979, 1981, 1984.  One daughter born in 31.   She is medically retired from the [employer] after a fall and a hip injury, around 2004.   Caffeinated beverage every other day x1   07/13/2017    Her Allergies Are:  Allergies  Allergen Reactions  . Codeine Itching, Rash and Hives  . Latex Hives    With the power  :   Her Current Medications Are:  Outpatient Encounter Medications as of 07/20/2018  Medication Sig  . amLODipine (NORVASC) 5 MG tablet Take 1 tablet (5 mg total) by mouth daily.  . cyclobenzaprine (FLEXERIL) 5 MG tablet Take 1-2 tablets (5-10 mg total) by mouth 3 (three) times daily as needed.  . ezetimibe (ZETIA) 10 MG tablet Take 1 tablet (10 mg total) by mouth daily.  . nitrofurantoin, macrocrystal-monohydrate, (MACROBID) 100 MG capsule Take 1 capsule (100 mg total) by mouth 2 (two) times daily for 5 days.  . mesalamine (LIALDA) 1.2 g EC tablet Take 4 tablets (4.8 g total) by mouth daily with breakfast.   No facility-administered encounter medications on file as of 07/20/2018.   :  Review of Systems:  Out of a complete 14 point review of systems, all are reviewed and negative with the exception of these symptoms as listed below: Review of Systems  Neurological:       Patient mentioned that she doesn't get a goodnight's rest due to her having a headache when she goes to bed and she also wakes up having a headache. Patient mentioned that she hasn't had a sleep study done before.  Epworth Sleepiness Scale 0= would never doze 1= slight chance of dozing 2= moderate chance of dozing 3= high chance of dozing  Sitting and  reading:2 Watching TV:0 Sitting inactive in a public place (ex. Theater or meeting):2 As a passenger in a car for an hour without a break:1 Lying down to rest in the afternoon:1 Sitting and talking to someone:0 Sitting quietly after lunch (no alcohol):2 In a car, while stopped in traffic:0 Total:8       Objective:  Neurological Exam  Physical Exam Physical Examination:   Vitals:  07/20/18 1533  BP: 132/89  Pulse: 75    General Examination: The patient is a very pleasant 57 y.o. female in no acute distress. She appears well-developed and well-nourished and well groomed.   HEENT: Normocephalic, atraumatic, pupils are equal, round and reactive to light and accommodation. Extraocular tracking is good without limitation to gaze excursion or nystagmus noted. Normal smooth pursuit is noted. Hearing is grossly intact. Face is symmetric with normal facial animation and normal facial sensation. Speech is clear with no dysarthria noted. There is no hypophonia. There is no lip, neck/head, jaw or voice tremor. Neck is supple with full range of passive and active motion. There are no carotid bruits on auscultation. Oropharynx exam reveals: mild mouth dryness, adequate dental hygiene and mild airway crowding, due to longer uvula, tonsils of about 1+ bilaterally, neck size 15.5 inches. Mallampati is class I. Tongue protrudes centrally and palate elevates symmetrically. She has a mild to maybe moderate overbite.   Chest: Clear to auscultation without wheezing, rhonchi or crackles noted.  Heart: S1+S2+0, regular and normal without murmurs, rubs or gallops noted.   Abdomen: Soft, non-tender and non-distended with normal bowel sounds appreciated on auscultation.  Extremities: There is no pitting edema in the distal lower extremities bilaterally.   Skin: Warm and dry without trophic changes noted.  Musculoskeletal: exam reveals no obvious joint deformities, tenderness or joint swelling or  erythema.   Neurologically:  Mental status: The patient is awake, alert and oriented in all 4 spheres. Her immediate and remote memory, attention, language skills and fund of knowledge are appropriate. There is no evidence of aphasia, agnosia, apraxia or anomia. Speech is clear with normal prosody and enunciation. Thought process is linear. Mood is normal and affect is normal.  Cranial nerves II - XII are as described above under HEENT exam. In addition: shoulder shrug is normal with equal shoulder height noted. Motor exam: Normal bulk, strength normal and normal tone is noted. There is no drift, tremor or rebound. Romberg is negative. Reflexes are 1-2+ throughout. Fine motor skills and coordination: grossly intact.  Cerebellar testing: No dysmetria or intention tremor. There is no truncal or gait ataxia.  Sensory exam: intact to light touch in the upper and lower extremities.  Gait, station and balance: She stands easily. No veering to one side is noted. No leaning to one side is noted. Posture is age-appropriate and stance is narrow based. Gait shows normal stride length and normal pace. No problems turning are noted. Tandem walk is unremarkable.   Assessment and Plan:    In summary, Estie Loll-Hines is a very pleasant 57 y.o.-year old female with an underlying medical history of hyperlipidemia, degenerative disc disease, anemia, ulcerative colitis and overweight state, whose history and physical exam are concerning for obstructive sleep apnea (OSA). I had a long chat with the patient about my findings and the diagnosis of OSA, its prognosis and treatment options. We talked about medical treatments, surgical interventions and non-pharmacological approaches. I explained in particular the risks and ramifications of untreated moderate to severe OSA, especially with respect to developing cardiovascular disease down the Road, including congestive heart failure, difficult to treat hypertension, cardiac  arrhythmias, or stroke. Even type 2 diabetes has, in part, been linked to untreated OSA. Symptoms of untreated OSA include daytime sleepiness, memory problems, mood irritability and mood disorder such as depression and anxiety, lack of energy, as well as recurrent headaches, especially morning headaches. We talked about trying to maintain a healthy lifestyle in general,  as well as the importance of weight control. I encouraged the patient to eat healthy, exercise daily and keep well hydrated, to keep a scheduled bedtime and wake time routine, to not skip any meals and eat healthy snacks in between meals. I advised the patient not to drive when feeling sleepy. I recommended the following at this time: sleep study with potential positive airway pressure titration. (We will score hypopneas at 4%).   I explained the sleep test procedure to the patient and also outlined possible surgical and non-surgical treatment options of OSA, including the use of a custom-made dental device (which would require a referral to a specialist dentist or oral surgeon), upper airway surgical options, such as pillar implants, radiofrequency surgery, tongue base surgery, and UPPP (which would involve a referral to an ENT surgeon). Rarely, jaw surgery such as mandibular advancement may be considered.  I also explained the CPAP treatment option to the patient, who indicated that she would be willing to try CPAP if the need arises. I explained the importance of being compliant with PAP treatment, not only for insurance purposes but primarily to improve Her symptoms, and for the patient's long term health benefit, including to reduce Her cardiovascular risks. I answered all her questions today and the patient was in agreement. I plan to see her back after the sleep study is completed and encouraged her to call with any interim questions, concerns, problems or updates.   Thank you very much for allowing me to participate in the care of this  nice patient. If I can be of any further assistance to you please do not hesitate to call me at [phone].  Sincerely,   Star Age, MD, PhD

## 2018-08-02 ENCOUNTER — Other Ambulatory Visit: Payer: PRIVATE HEALTH INSURANCE

## 2018-08-03 ENCOUNTER — Ambulatory Visit (INDEPENDENT_AMBULATORY_CARE_PROVIDER_SITE_OTHER): Payer: Medicare Other | Admitting: Orthopaedic Surgery

## 2018-08-03 ENCOUNTER — Encounter (INDEPENDENT_AMBULATORY_CARE_PROVIDER_SITE_OTHER): Payer: Self-pay

## 2018-08-11 ENCOUNTER — Ambulatory Visit (HOSPITAL_COMMUNITY)
Admission: EM | Admit: 2018-08-11 | Discharge: 2018-08-11 | Disposition: A | Discharge status: Home / Self Care | Payer: Medicare Other | Attending: Family Medicine | Admitting: Family Medicine

## 2018-08-11 ENCOUNTER — Encounter (HOSPITAL_COMMUNITY): Payer: Self-pay

## 2018-08-11 ENCOUNTER — Ambulatory Visit (INDEPENDENT_AMBULATORY_CARE_PROVIDER_SITE_OTHER): Payer: Medicare Other

## 2018-08-11 DIAGNOSIS — S93401A Sprain of unspecified ligament of right ankle, initial encounter: Secondary | ICD-10-CM

## 2018-08-11 DIAGNOSIS — S99911A Unspecified injury of right ankle, initial encounter: Secondary | ICD-10-CM | POA: Diagnosis not present

## 2018-08-11 IMAGING — DX DG ANKLE COMPLETE 3+V*R*
3 series · 3 of 3 positions shown · non-contrast
Comparison: None.

CLINICAL DATA: Injury 1 week ago.  Pain anteriorly.

EXAM:
RIGHT ANKLE - COMPLETE 3+ VIEW

[ankle ap]
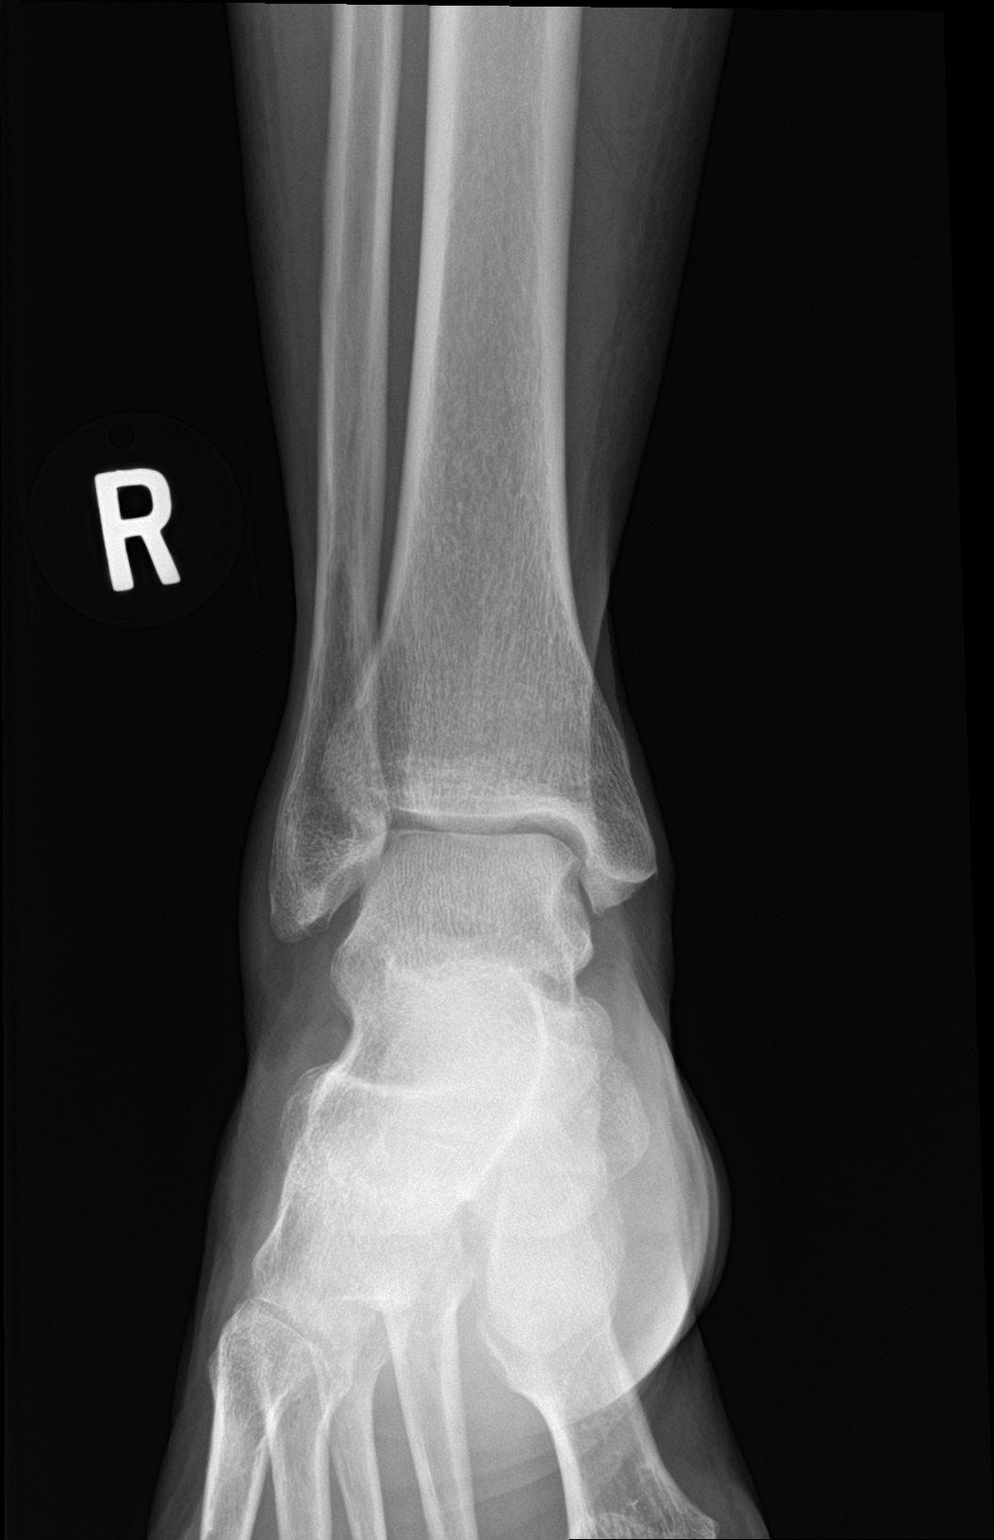

[ankle obl]
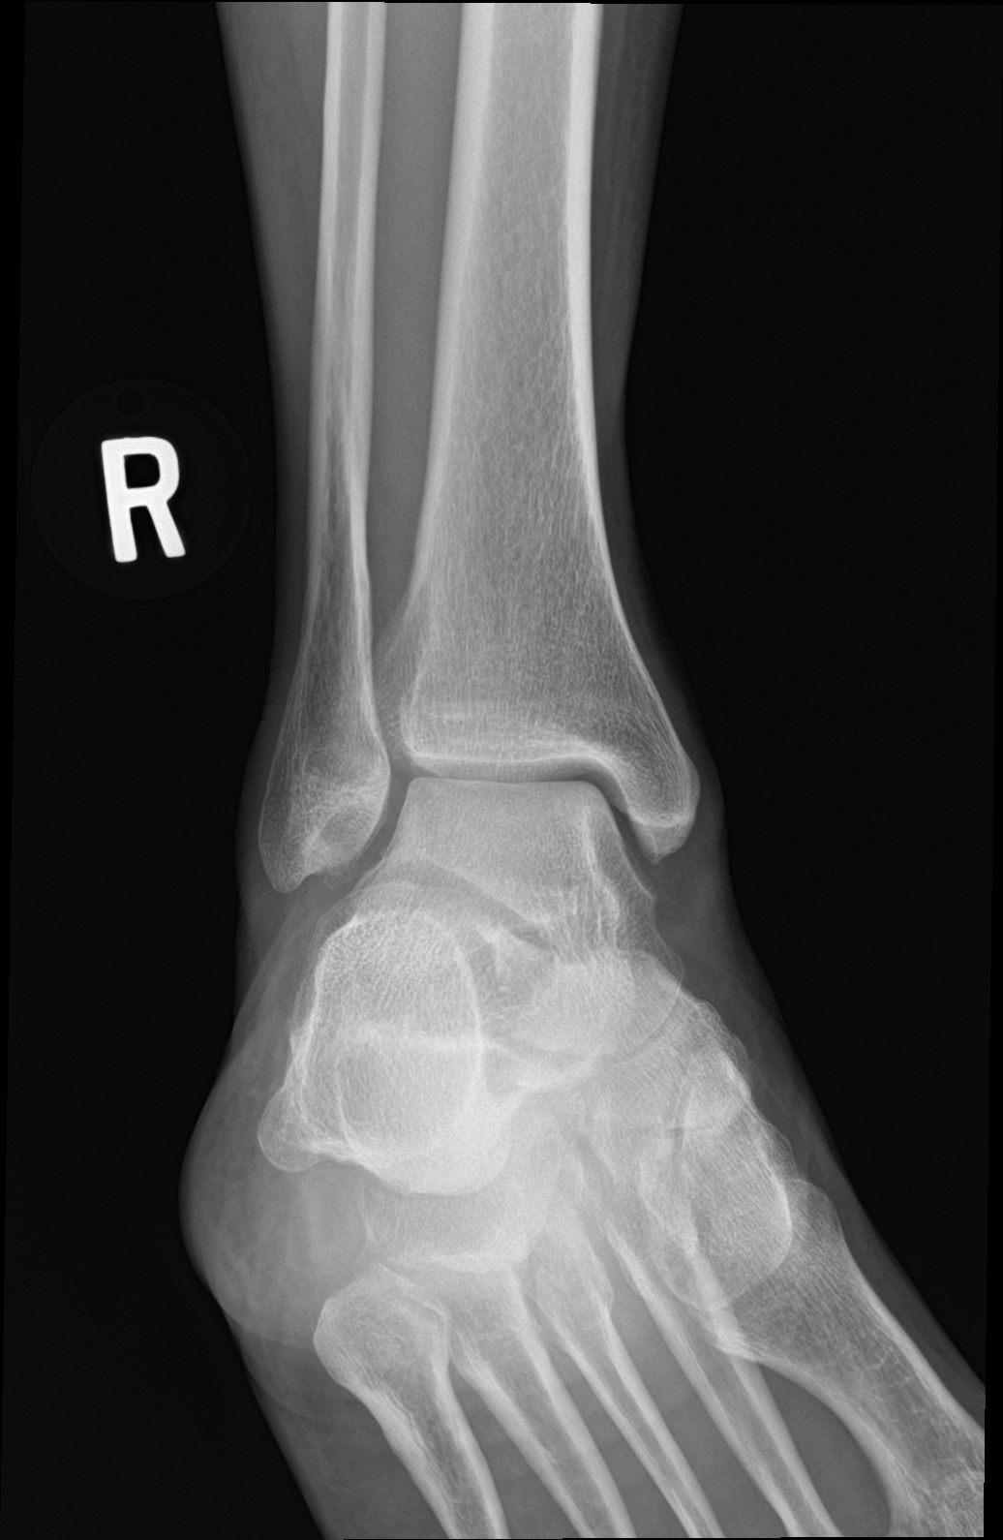

[ankle lat]
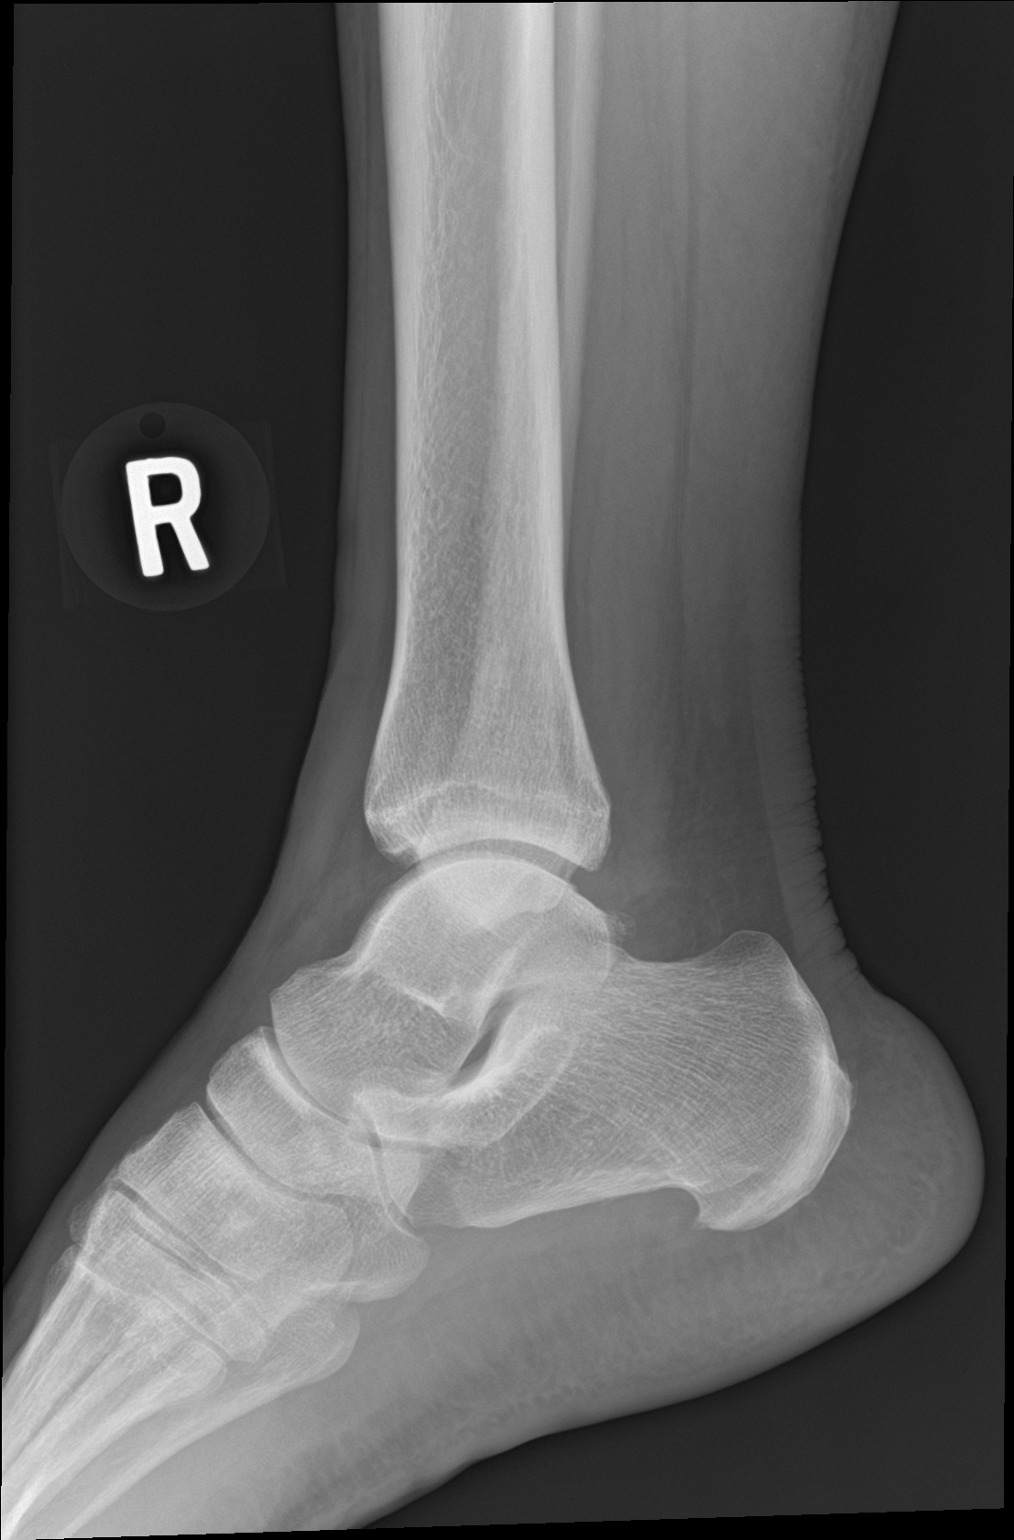

[3 of 3 positions shown; findings below may reference images not displayed]

FINDINGS: There is no evidence of fracture, dislocation, or joint effusion.
There is no evidence of arthropathy or other focal bone abnormality.
Soft tissues are unremarkable.
IMPRESSION: Negative.

## 2018-08-11 MED ORDER — NAPROXEN 500 MG PO TABS: 500.0000 mg | ORAL_TABLET | Freq: Two times a day (BID) | ORAL | 0 refills | Status: DC

## 2018-08-11 NOTE — ED Provider Notes (Signed)
[place]    CSN: [id number] Arrival date & time: 08/11/18  1523     History   Chief Complaint Chief Complaint  Patient presents with  . Ankle Pain    Right    HPI Beverly Ramirez is a 57 y.o. female  history of hyperlipidemia, ulcerative colitis, presenting today for evaluation of right ankle pain.  Patient was walking on Friday in the street over some leaves, she slipped and twisted her ankle.  Since she has had pain with weightbearing.  Pain with dorsiflexion.  She has been wearing a Coban wrap to help with support and swelling.  Denies previous injury to this ankle.  Reports pain mainly in the front.  Denies numbness or tingling.  HPI  Past Medical History:  Diagnosis Date  . Anemia   . Carpal tunnel syndrome    bilateral  . Chronic heel pain   . DJD (degenerative joint disease) of cervical spine    MRI 2017  . Hyperlipidemia   . Plantar fasciitis    bilateral  . UC (ulcerative colitis) [facility])     Patient Active Problem List   Diagnosis Date Noted  . Leukocytosis 10/04/2017  . Hyperlipidemia 08/04/2017  . Ulcerative colitis without complications ([place]) 03/01/7627  . Plantar fasciitis 05/28/2017    Past Surgical History:  Procedure Laterality Date  . ABDOMINAL HYSTERECTOMY    . APPENDECTOMY    . CESAREAN SECTION     x4  . COLONOSCOPY     Multiple in [place]  . ESOPHAGOGASTRODUODENOSCOPY      OB History    Gravida  4   Para  4   Term      Preterm      AB      Living  4     SAB      TAB      Ectopic      Multiple      Live Births               Home Medications    Prior to Admission medications   Medication Sig Start Date End Date Taking? Authorizing Provider  amLODipine (NORVASC) 5 MG tablet Take 1 tablet (5 mg total) by mouth daily. 05/07/18   Rutherford Guys, MD  cyclobenzaprine (FLEXERIL) 5 MG tablet Take 1-2 tablets (5-10 mg total) by mouth 3 (three) times daily as needed. 06/19/18   Wendie Agreste, MD   ezetimibe (ZETIA) 10 MG tablet Take 1 tablet (10 mg total) by mouth daily. 04/12/18   Rutherford Guys, MD  mesalamine (LIALDA) 1.2 g EC tablet Take 4 tablets (4.8 g total) by mouth daily with breakfast. 04/12/18 07/11/18  Rutherford Guys, MD  naproxen (NAPROSYN) 500 MG tablet Take 1 tablet (500 mg total) by mouth 2 (two) times daily. 08/11/18   Wieters, Elesa Hacker, PA-C    Family History Family History  Problem Relation Age of Onset  . Stroke Mother   . Diabetes Mother   . Hypertension Mother   . Lung cancer Mother   . Sickle cell trait Mother   . Breast cancer Maternal Aunt   . Diabetes Maternal Aunt   . Lung cancer Maternal Aunt   . Lung cancer Maternal Aunt   . Lung cancer Maternal Aunt   . Colon cancer Cousin 66  . Sickle cell trait Father   . Sickle cell anemia Sister 33  . Lung cancer Maternal Grandmother   . Sickle cell anemia Sister   .  Other Sister        accident and blood clot formed    Social History Social History   Tobacco Use  . Smoking status: Former Smoker    Types: Cigarettes    Last attempt to quit: 10/13/2006    Years since quitting: 11.8  . Smokeless tobacco: Never Used  Substance Use Topics  . Alcohol use: Yes    Comment: occasional  . Drug use: No     Allergies   Codeine and Latex   Review of Systems Review of Systems  Constitutional: Negative for fatigue and fever.  Eyes: Negative for visual disturbance.  Respiratory: Negative for shortness of breath.   Cardiovascular: Negative for chest pain.  Gastrointestinal: Negative for abdominal pain, nausea and vomiting.  Musculoskeletal: Positive for arthralgias and myalgias. Negative for joint swelling.  Skin: Negative for color change, rash and wound.  Neurological: Negative for dizziness, weakness, light-headedness and headaches.     Physical Exam Triage Vital Signs ED Triage Vitals  Enc Vitals Group     BP 08/11/18 1602 117/86     Pulse Rate 08/11/18 1602 84     Resp 08/11/18 1602 20      Temp 08/11/18 1602 98 F (36.7 C)     Temp Source 08/11/18 1602 Oral     SpO2 08/11/18 1602 99 %     Weight --      Height --      Head Circumference --      Peak Flow --      Pain Score 08/11/18 1600 7     Pain Loc --      Pain Edu? --      Excl. in [place]? --    No data found.  Updated Vital Signs BP 117/86 (BP Location: Right Arm)   Pulse 84   Temp 98 F (36.7 C) (Oral)   Resp 20   SpO2 99%   Visual Acuity Right Eye Distance:   Left Eye Distance:   Bilateral Distance:    Right Eye Near:   Left Eye Near:    Bilateral Near:     Physical Exam  Constitutional: She is oriented to person, place, and time. She appears well-developed and well-nourished.  No acute distress  HENT:  Head: Normocephalic and atraumatic.  Nose: Nose normal.  Eyes: Conjunctivae are normal.  Neck: Neck supple.  Cardiovascular: Normal rate.  Pulmonary/Chest: Effort normal. No respiratory distress.  Abdominal: She exhibits no distension.  Musculoskeletal: Normal range of motion.  No obvious swelling, deformity or erythema to right ankle, nontender to medial lateral malleolus, significant tenderness throughout anterior aspect of ankle, nontender throughout dorsum of foot, dorsalis pedis 2+, cap refill less than 2 seconds  Nontender at fibular insertion at knee  Neurological: She is alert and oriented to person, place, and time.  Skin: Skin is warm and dry.  Psychiatric: She has a normal mood and affect.  Nursing note and vitals reviewed.    UC Treatments / Results  Labs (all labs ordered are listed, but only abnormal results are displayed) Labs Reviewed - No data to display  EKG None  Radiology Dg Ankle Complete Right  Result Date: 08/11/2018 CLINICAL DATA:  Injury 1 week ago.  Pain anteriorly. EXAM: RIGHT ANKLE - COMPLETE 3+ VIEW COMPARISON:  None. FINDINGS: There is no evidence of fracture, dislocation, or joint effusion. There is no evidence of arthropathy or other focal bone  abnormality. Soft tissues are unremarkable. IMPRESSION: Negative. Electronically Signed   By:  Dorise Bullion III M.D   On: 08/11/2018 16:40    Procedures Procedures (including critical care time)  Medications Ordered in UC Medications - No data to display  Initial Impression / Assessment and Plan / UC Course  I have reviewed the triage vital signs and the nursing notes.  Pertinent labs & imaging results that were available during my care of the patient were reviewed by me and considered in my medical decision making (see chart for details).    X-ray negative for fracture, will treat as sprain.  Ace wrap provided, anti-inflammatories, ice and elevation.  Activity modification.Discussed strict return precautions. Patient verbalized understanding and is agreeable with plan.  Final Clinical Impressions(s) / UC Diagnoses   Final diagnoses:  Sprain of right ankle, unspecified ligament, initial encounter     Discharge Instructions     NO fracture on xray, likely sprain Use anti-inflammatories for pain/swelling. You may take up to 800 mg Ibuprofen every 8 hours with food. You may supplement Ibuprofen with Tylenol [phone] mg every 8 hours.  OR  Naprosyn twice daily with food  Ice and elevate ankle  Limit acitivity  Follow up if not improving    ED Prescriptions    Medication Sig Dispense Auth. Provider   naproxen (NAPROSYN) 500 MG tablet Take 1 tablet (500 mg total) by mouth 2 (two) times daily. 30 tablet Wieters, [place] C, PA-C     Controlled Substance Prescriptions [place] Controlled Substance Registry consulted? Not Applicable   Janith Lima, [place] 08/11/18 1654

## 2018-08-11 NOTE — ED Triage Notes (Signed)
Pt presents with right ankle pain and swelling after a fall.

## 2018-08-11 NOTE — Discharge Instructions (Signed)
NO fracture on xray, likely sprain Use anti-inflammatories for pain/swelling. You may take up to 800 mg Ibuprofen every 8 hours with food. You may supplement Ibuprofen with Tylenol [id number] mg every 8 hours.  OR  Naprosyn twice daily with food  Ice and elevate ankle  Limit acitivity  Follow up if not improving

## 2018-08-15 ENCOUNTER — Ambulatory Visit (HOSPITAL_COMMUNITY)
Admission: EM | Admit: 2018-08-15 | Discharge: 2018-08-15 | Disposition: A | Discharge status: Home / Self Care | Payer: Medicare Other | Attending: Physician Assistant | Admitting: Physician Assistant

## 2018-08-15 ENCOUNTER — Encounter (HOSPITAL_COMMUNITY): Payer: Self-pay | Admitting: Emergency Medicine

## 2018-08-15 DIAGNOSIS — L03114 Cellulitis of left upper limb: Secondary | ICD-10-CM

## 2018-08-15 MED ORDER — CEPHALEXIN 500 MG PO CAPS: 500.0000 mg | ORAL_CAPSULE | Freq: Three times a day (TID) | ORAL | 0 refills | Status: AC

## 2018-08-15 NOTE — Discharge Instructions (Addendum)
Remember that if you are getting worse you will need to come back in so we can look at the arm again.  Please take the medicine and try not to miss doses.  I have checked the computer and it looks like your Tdap was given you in 2018 so you do not need that again today.

## 2018-08-15 NOTE — ED Provider Notes (Signed)
08/15/2018 1:39 PM   DOB: 04-06-1961 / MRN: [id number]  SUBJECTIVE:  Beverly Ramirez is a 57 y.o. female presenting for left elbow pain that started a few days ago after an insect bite.  Reports itching at the time of the bite.  Assoicates new redness, swelling and, new pain about the area.  Denies itching at this time.  Has tried nothing.    Immunization History  Administered Date(s) Administered  . Influenza,inj,Quad PF,6+ Mos 07/13/2017, 05/07/2018  . Tdap 08/04/2017    She is allergic to codeine and latex.   She  has a past medical history of Anemia, Carpal tunnel syndrome, Chronic heel pain, DJD (degenerative joint disease) of cervical spine, Hyperlipidemia, Plantar fasciitis, and UC (ulcerative colitis) ([place]).    She  reports that she quit smoking about 11 years ago. Her smoking use included cigarettes. She has never used smokeless tobacco. She reports that she drinks alcohol. She reports that she does not use drugs. She  reports that she currently engages in sexual activity and has had partner(s) who are Female. The patient  has a past surgical history that includes Appendectomy; Abdominal hysterectomy; Cesarean section; Colonoscopy; and Esophagogastroduodenoscopy.  Her family history includes Breast cancer in her maternal aunt; Colon cancer (age of onset: 32) in her cousin; Diabetes in her maternal aunt and mother; Hypertension in her mother; Lung cancer in her maternal aunt, maternal aunt, maternal aunt, maternal grandmother, and mother; Other in her sister; Sickle cell anemia in her sister; Sickle cell anemia (age of onset: 71) in her sister; Sickle cell trait in her father and mother; Stroke in her mother.  ROS  OBJECTIVE:  BP (!) 142/89   Pulse 85   Temp 98.2 F (36.8 C)   Resp 16   SpO2 99%   Wt Readings from Last 3 Encounters:  07/20/18 164 lb 8 oz (74.6 kg)  07/16/18 161 lb 12.8 oz (73.4 kg)  07/13/18 164 lb (74.4 kg)   Temp Readings from Last 3 Encounters:   08/15/18 98.2 F (36.8 C)  08/11/18 98 F (36.7 C) (Oral)  07/16/18 98.3 F (36.8 C) (Oral)   BP Readings from Last 3 Encounters:  08/15/18 (!) 142/89  08/11/18 117/86  07/20/18 132/89   Pulse Readings from Last 3 Encounters:  08/15/18 85  08/11/18 84  07/20/18 75    Physical Exam  Constitutional: She is oriented to person, place, and time. She appears well-nourished. No distress.  Eyes: Pupils are equal, round, and reactive to light. EOM are normal.  Cardiovascular: Normal rate.  Pulmonary/Chest: Effort normal.  Abdominal: She exhibits no distension.  Musculoskeletal:  Swelling about the posterior proximal forearm with exquisite tenderness and erythema.  No focal abscess identified.  No punctum identified.  The olecranon bursa is not inflamed.  Neurological: She is alert and oriented to person, place, and time. No cranial nerve deficit. Gait normal.  Skin: Skin is dry. She is not diaphoretic.  Psychiatric: She has a normal mood and affect.  Vitals reviewed.   No results found for this or any previous visit (from the past 72 hour(s)).  No results found.  ASSESSMENT AND PLAN:   Cellulitis of left upper extremity    Discharge Instructions     Remember that if you are getting worse you will need to come back in so we can look at the arm again.  Please take the medicine and try not to miss doses.  I have checked the computer and it looks like your Tdap  was given you in 2018 so you do not need that again today.        The patient is advised to call or return to clinic if she does not see an improvement in symptoms, or to seek the care of the closest emergency department if she worsens with the above plan.   Philis Fendt, MHS, PA-C 08/15/2018 1:39 PM   Tereasa Coop, PA-C 08/15/18 1339

## 2018-08-15 NOTE — ED Triage Notes (Signed)
Pt states she thinks something bit her on her L elbow, states its swollen, red, and hot, and very painful.

## 2018-08-19 ENCOUNTER — Other Ambulatory Visit: Payer: PRIVATE HEALTH INSURANCE

## 2018-09-06 ENCOUNTER — Ambulatory Visit
Admission: RE | Admit: 2018-09-06 | Discharge: 2018-09-06 | Disposition: A | Discharge status: Home / Self Care | Payer: Medicare Other | Source: Ambulatory Visit | Attending: Orthopaedic Surgery | Admitting: Orthopaedic Surgery

## 2018-09-06 DIAGNOSIS — M47812 Spondylosis without myelopathy or radiculopathy, cervical region: Secondary | ICD-10-CM | POA: Diagnosis not present

## 2018-09-06 DIAGNOSIS — M542 Cervicalgia: Secondary | ICD-10-CM

## 2018-09-06 DIAGNOSIS — M4802 Spinal stenosis, cervical region: Secondary | ICD-10-CM | POA: Diagnosis not present

## 2018-09-06 IMAGING — MR MR CERVICAL SPINE W/O CM
4 of 5 series · 27 of 48 positions shown · non-contrast
Comparison: Cervical spine radiographs [DATE].

CLINICAL DATA: 57-year-old female with neck pain, left shoulder arm
and hand pain. Remote neck injury with fall down stairs. Bilateral
hand pain and numbness.

EXAM:
MRI CERVICAL SPINE WITHOUT CONTRAST
TECHNIQUE: Multiplanar, multisequence MR imaging of the cervical spine was
performed. No intravenous contrast was administered.

[Series 6: T1 · sagittal · 3.0mm · 0.66mm/px · 6 of 15 slices shown]
[im 1/15]
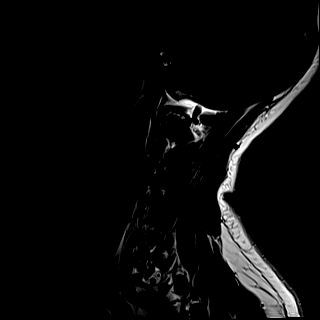
[im 3/15]
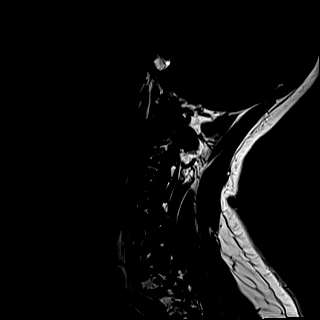
[im 6/15]
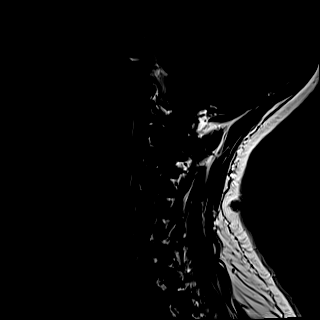
[im 9/15]
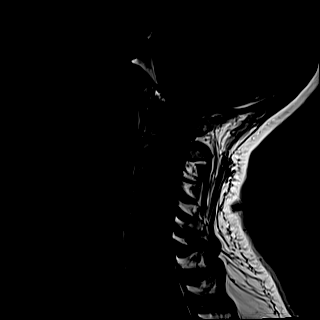
[im 12/15]
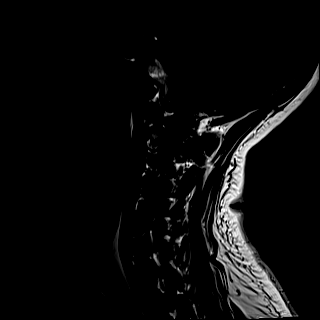
[im 15/15]
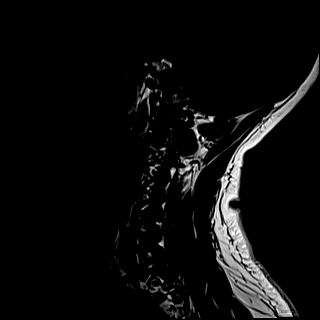

[Series 7: T2 · sagittal · 3.0mm · 0.55mm/px · 7 of 15 slices shown (1 of 2)]
[im 1/15]
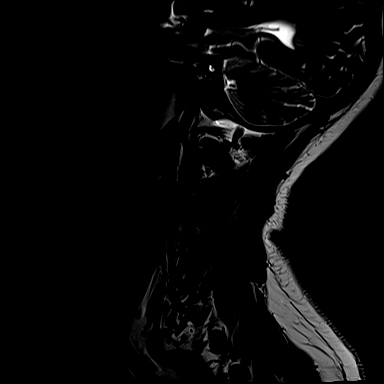
[im 3/15]
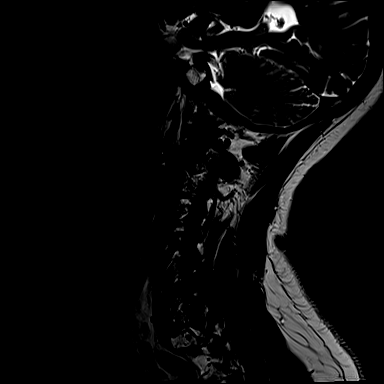
[im 5/15]
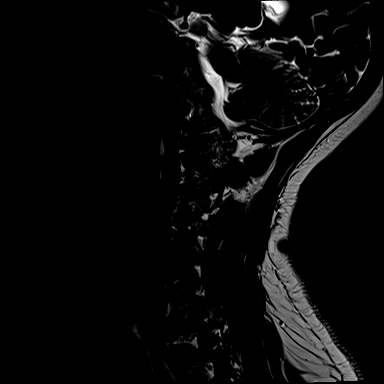
[im 8/15]
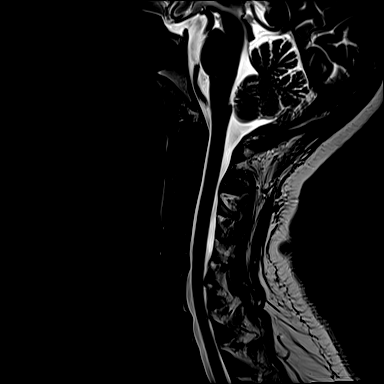
[im 10/15]
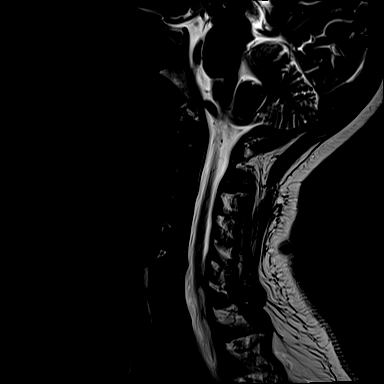
[im 12/15]
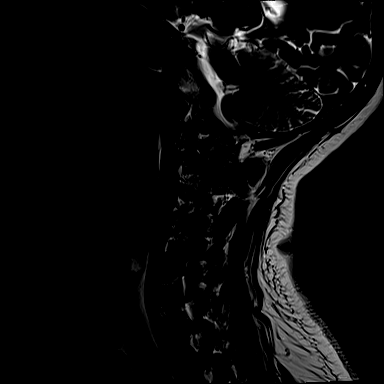
[im 15/15]
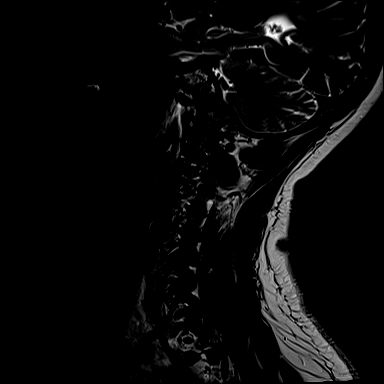

[Series 8: STIR · sagittal · 3.0mm · 0.33mm/px · 6 of 15 slices shown]
[im 1/15]
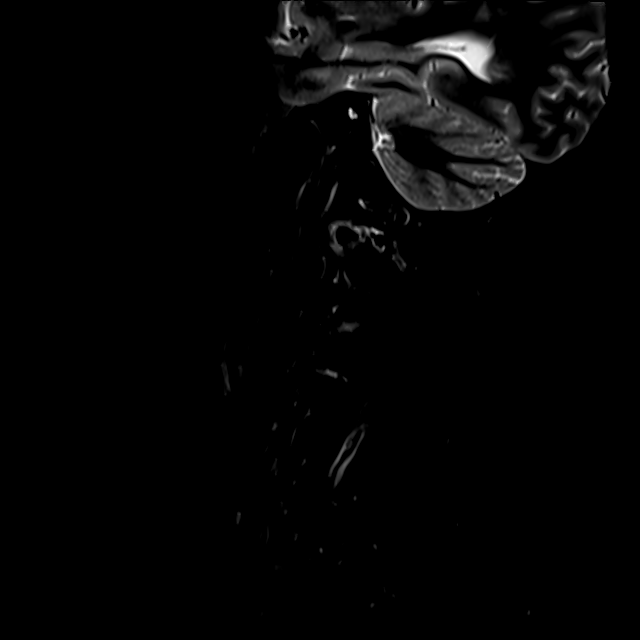
[im 3/15]
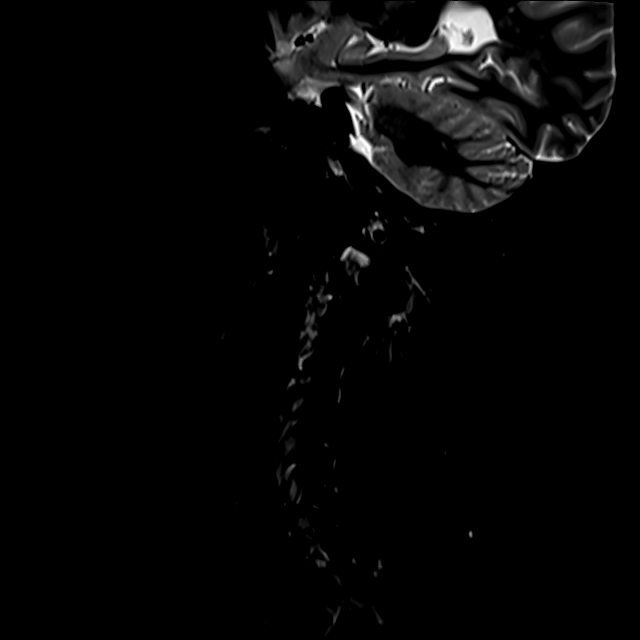
[im 5/15]
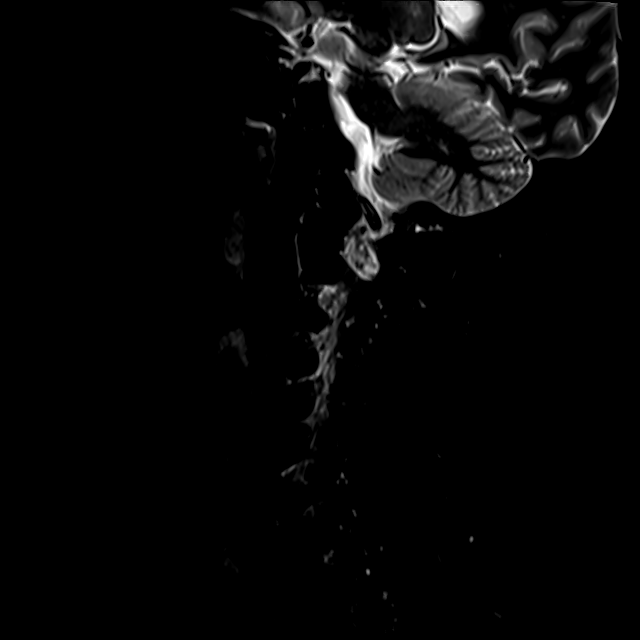
[im 8/15]
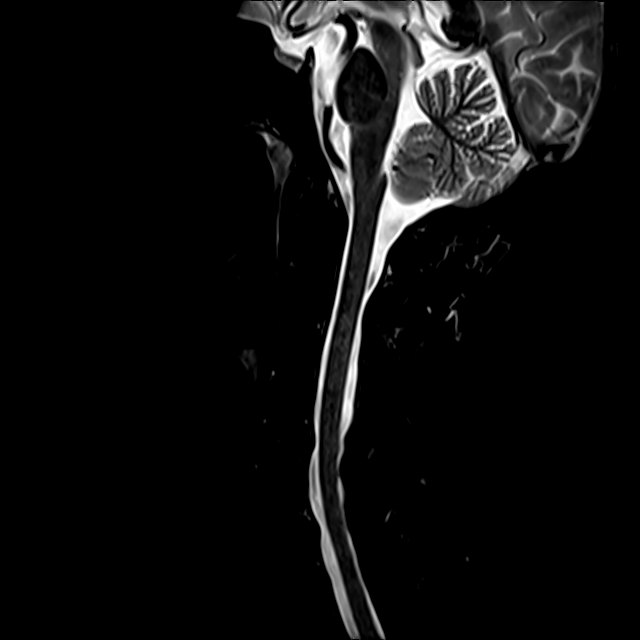
[im 10/15]
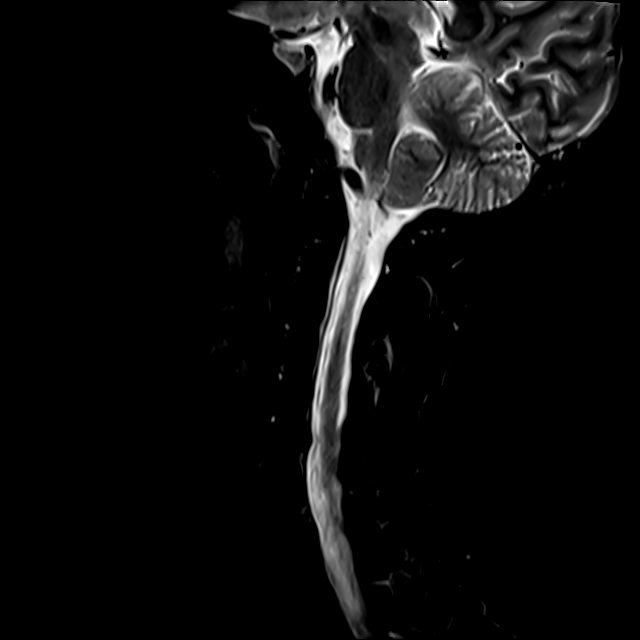
[im 12/15]
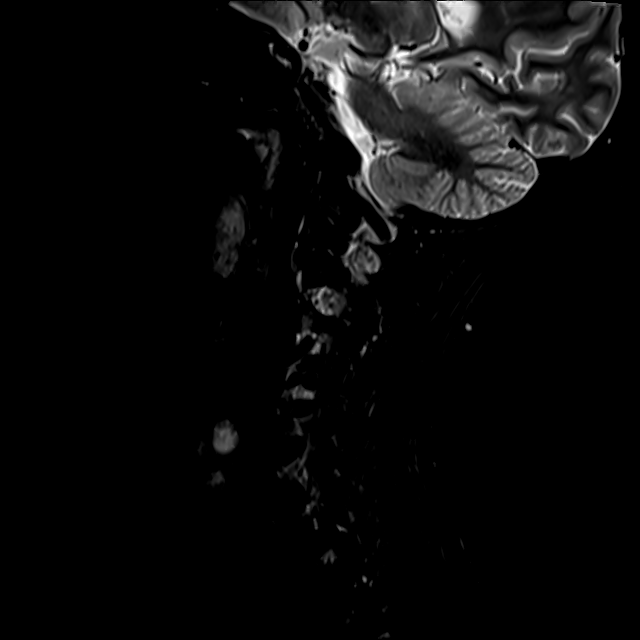

[Series 9: T2 · axial · 3.0mm · 0.50mm/px · z∈[-90,+5]mm · 8 of 31 slices shown (2 of 2)]
[im 1/31]
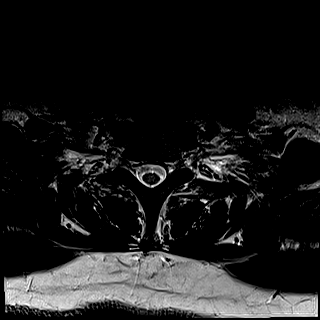
[im 5/31]
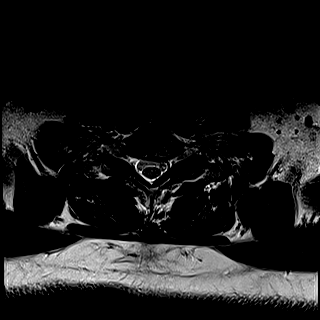
[im 10/31]
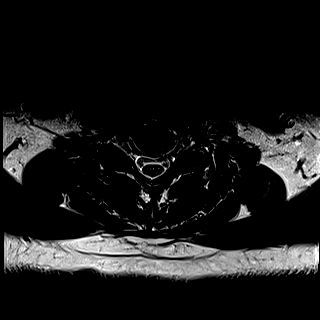
[im 14/31]
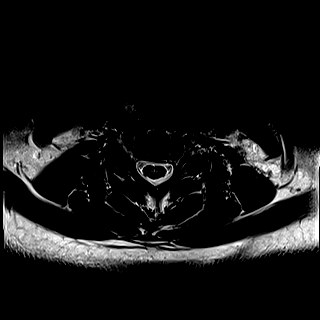
[im 17/31]
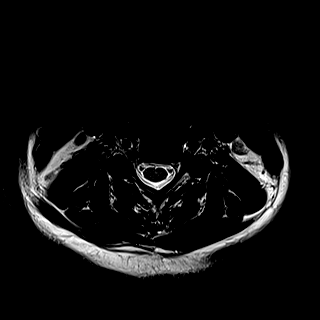
[im 21/31]
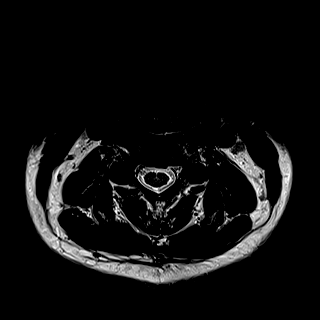
[im 26/31]
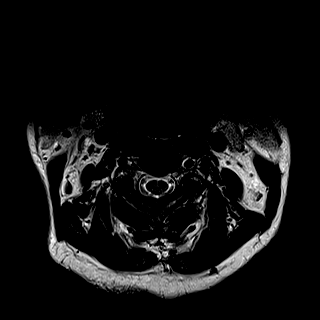
[im 31/31]
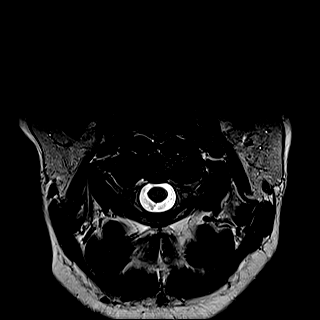

[27 of 48 positions shown; findings below may reference images not displayed]

FINDINGS: Alignment: Straightening of cervical lordosis with mild
retrolisthesis of C5 on C6 is stable [REDACTED]. No other
spondylolisthesis.

Vertebrae: No marrow edema or evidence of acute osseous abnormality.
Visualized bone marrow signal is within normal limits.

Cord: Spinal cord signal is within normal limits at all visualized
levels.

Posterior Fossa, vertebral arteries, paraspinal tissues:
Cervicomedullary junction is within normal limits. Negative visible
brain parenchyma. Preserved major vascular flow voids in the neck.

Multiple thyroid nodules in the right lobe measuring up to 11
millimeters diameter (series 8, image 4) do not meet size criteria
for ultrasound follow-up. Otherwise negative neck soft tissues.

Disc levels:

C2-C3: Mild thickening of the posterior longitudinal ligament. No
stenosis.

C3-C4:  Mild facet hypertrophy.  No stenosis.

C4-C5: Minor disc bulge and endplate spurring. Mild facet
hypertrophy. No stenosis.

C5-C6: Mild retrolisthesis with disc space loss. Circumferential
disc bulge and endplate spurring slightly eccentric to the left.
Mild facet hypertrophy. No spinal stenosis. Moderate left and mild
right C6 foraminal stenosis.

C6-C7:  Mild facet and ligament flavum hypertrophy.  No stenosis.

C7-T1: Mild circumferential disc bulge and endplate spurring
eccentric to the left. Mild facet hypertrophy. No spinal stenosis.
Mild left and borderline to mild right C8 foraminal stenosis.

No upper thoracic stenosis.
IMPRESSION: 1. C5-C6 disc and endplate degeneration in the setting of mild
retrolisthesis at that level. No spinal stenosis, but moderate left
and mild right C6 neural foraminal stenosis.
2. Mild cervical spine degeneration elsewhere. Borderline to mild C8
neural foraminal stenosis greater on the left.

## 2018-09-20 ENCOUNTER — Ambulatory Visit: Payer: Medicare Other | Admitting: Family Medicine

## 2018-09-23 ENCOUNTER — Encounter: Payer: Self-pay | Admitting: Physician Assistant

## 2018-09-23 ENCOUNTER — Ambulatory Visit (INDEPENDENT_AMBULATORY_CARE_PROVIDER_SITE_OTHER): Payer: Medicare Other | Admitting: Physician Assistant

## 2018-09-23 ENCOUNTER — Ambulatory Visit: Payer: Medicare Other | Admitting: Nurse Practitioner

## 2018-09-23 ENCOUNTER — Other Ambulatory Visit (INDEPENDENT_AMBULATORY_CARE_PROVIDER_SITE_OTHER): Payer: Medicare Other

## 2018-09-23 VITALS — BP 116/74 | HR 84 | Ht 62.5 in | Wt 159.8 lb

## 2018-09-23 DIAGNOSIS — K51019 Ulcerative (chronic) pancolitis with unspecified complications: Secondary | ICD-10-CM

## 2018-09-23 LAB — COMPREHENSIVE METABOLIC PANEL
ALT: 25 U/L (ref 0–35)
AST: 25 U/L (ref 0–37)
Albumin: 3.8 g/dL (ref 3.5–5.2)
Alkaline Phosphatase: 94 U/L (ref 39–117)
BILIRUBIN TOTAL: 0.4 mg/dL (ref 0.2–1.2)
BUN: 11 mg/dL (ref 6–23)
CO2: 31 meq/L (ref 19–32)
Calcium: 9.5 mg/dL (ref 8.4–10.5)
Chloride: 104 mEq/L (ref 96–112)
Creatinine, Ser: 0.82 mg/dL (ref 0.40–1.20)
GFR: 87.06 mL/min (ref >60.00)
Glucose, Bld: 90 mg/dL (ref 70–99)
Potassium: 3.7 mEq/L (ref 3.5–5.1)
Sodium: 141 mEq/L (ref 135–145)
Total Protein: 7 g/dL (ref 6.0–8.3)

## 2018-09-23 LAB — CBC WITH DIFFERENTIAL/PLATELET
Basophils Absolute: 0 10*3/uL (ref 0.0–0.1)
Basophils Relative: 0.1 % (ref 0.0–3.0)
Eosinophils Absolute: 1.2 10*3/uL — ABNORMAL HIGH (ref 0.0–0.7)
Eosinophils Relative: 11.3 % — ABNORMAL HIGH (ref 0.0–5.0)
HCT: 35.3 % — ABNORMAL LOW (ref 36.0–46.0)
Hemoglobin: 12.1 g/dL (ref 12.0–15.0)
LYMPHS ABS: 4 10*3/uL (ref 0.7–4.0)
Lymphocytes Relative: 36.7 % (ref 12.0–46.0)
MCHC: 34.2 g/dL (ref 30.0–36.0)
MCV: 85.1 fl (ref 78.0–100.0)
Monocytes Absolute: 1.4 10*3/uL — ABNORMAL HIGH (ref 0.1–1.0)
Monocytes Relative: 12.4 % — ABNORMAL HIGH (ref 3.0–12.0)
NEUTROS ABS: 4.3 10*3/uL (ref 1.4–7.7)
Neutrophils Relative %: 39.5 % — ABNORMAL LOW (ref 43.0–77.0)
PLATELETS: 402 10*3/uL — AB (ref 150.0–400.0)
RBC: 4.15 Mil/uL (ref 3.87–5.11)
RDW: 13.5 % (ref 11.5–15.5)
WBC: 10.9 10*3/uL — ABNORMAL HIGH (ref 4.0–10.5)

## 2018-09-23 LAB — SEDIMENTATION RATE: Sed Rate: 20 mm/hr (ref 0–30)

## 2018-09-23 LAB — C-REACTIVE PROTEIN: CRP: 0.5 mg/dL (ref 0.5–20.0)

## 2018-09-23 MED ORDER — PREDNISONE 10 MG PO TABS: ORAL_TABLET | ORAL | 0 refills | Status: DC

## 2018-09-23 NOTE — Patient Instructions (Signed)
Your provider has requested that you go to the basement level for lab work before leaving today. Press "B" on the elevator. The lab is located at the first door on the left as you exit the elevator.   We have sent the following medications to your pharmacy for you to pick up at your convenience: Prednisone   We are making you a follow up appointment with Dr Carlean Purl.   I appreciate the opportunity to care for you.

## 2018-09-23 NOTE — Progress Notes (Addendum)
Chief Complaint: Colitis flare  HPI:     Beverly Ramirez is a 58 year old African-American female, known to Dr. Carlean Purl, with a history of ulcerative colitis, who presents to clinic today with a colitis flare.    09/22/2017 office visit with Dr. Carlean Purl.  At that time, it was discussed she had a history of ulcerative colitis treated in [place] and moved to [place] in 2018.  She described having a flare at that time missing some doses of her Lialda with abdominal soreness and "hands that were getting dry and cracking".  At that time not describing significant diarrhea or bleeding.  Did not have Medicare part D.  It was discussed that it probably be impossible to get her back on a biologic without some sort of insurance coverage.  She had a recheck of quantiferon Gold which was negative, as well as a TPMT level in anticipation of starting 6-MP.  Thought about possibly adding 6-MP +/- Lialda.  This would be the most cost effective route.  Given a one-month taper Prednisone starting at 40 mg daily declining by 10 mg each week.      Today, patient presents clinic and explains that she has been using her Lialda 4.8 g/day but about 2 weeks ago started with a colitis flare.  Tells me this is associated with an increase in diarrheal loose stools and some hematochezia as well as nausea and a cracking of her hands and mouth with some nausea and vomiting.  She tells me the colitis has been "kicking my butt".  Her stomach is also sore and food feels as though it sits in her throat, but no true dysphagia.    No change in insurance since being seen last.     Denies fever, chills, weight loss or symptoms that awaken her from sleep.  Previous GI history: Dx 2007 [place] - hospitalized and then Tx ASA 6 MP x years Dr. Aniceto Boss off meds Readmitted 2016 - Tx IV steroids Then Entyvio - did well with UC but had some nausea, increased LFT's Colonoscopy mild rectal edema w/ micro colitis in ascending only Neg  EGD Changed to Humira 2017 - rapid response - did well but moved to [place] and insurance change and could not afford Humira CRP and ESR do rise w/ flares  Past Medical History:  Diagnosis Date  . Anemia   . Carpal tunnel syndrome    bilateral  . Chronic heel pain   . DJD (degenerative joint disease) of cervical spine    MRI 2017  . Hyperlipidemia   . Plantar fasciitis    bilateral  . UC (ulcerative colitis) ([place])     Past Surgical History:  Procedure Laterality Date  . ABDOMINAL HYSTERECTOMY    . APPENDECTOMY    . CESAREAN SECTION     x4  . COLONOSCOPY     Multiple in [place]  . ESOPHAGOGASTRODUODENOSCOPY      Current Outpatient Medications  Medication Sig Dispense Refill  . amLODipine (NORVASC) 5 MG tablet Take 1 tablet (5 mg total) by mouth daily. 90 tablet 3  . cyclobenzaprine (FLEXERIL) 5 MG tablet Take 1-2 tablets (5-10 mg total) by mouth 3 (three) times daily as needed. 15 tablet 0  . ezetimibe (ZETIA) 10 MG tablet Take 1 tablet (10 mg total) by mouth daily. 90 tablet 1  . mesalamine (LIALDA) 1.2 g EC tablet Take 4 tablets (4.8 g total) by mouth daily with breakfast. 120 tablet 2  . naproxen (NAPROSYN)  500 MG tablet Take 1 tablet (500 mg total) by mouth 2 (two) times daily. 30 tablet 0   No current facility-administered medications for this visit.     Allergies as of 09/23/2018 - Review Complete 08/15/2018  Allergen Reaction Noted  . Codeine Itching, Rash, and Hives 07/07/2016  . Latex Hives 02/06/2017    Family History  Problem Relation Age of Onset  . Stroke Mother   . Diabetes Mother   . Hypertension Mother   . Lung cancer Mother   . Sickle cell trait Mother   . Breast cancer Maternal Aunt   . Diabetes Maternal Aunt   . Lung cancer Maternal Aunt   . Lung cancer Maternal Aunt   . Lung cancer Maternal Aunt   . Colon cancer Cousin 32  . Sickle cell trait Father   . Sickle cell anemia Sister 9  . Lung cancer Maternal Grandmother   . Sickle cell  anemia Sister   . Other Sister        accident and blood clot formed    Social History   Socioeconomic History  . Marital status: Married    Spouse name: Beverly Ramirez  . Number of children: 4  . Years of education: Not on file  . Highest education level: Not on file  Occupational History  . Occupation: retired    Comment: He formerly worked for [employer] of [employer], disabled due to a fall  Social Needs  . Financial resource strain: Not on file  . Food insecurity:    Worry: Not on file    Inability: Not on file  . Transportation needs:    Medical: Not on file    Non-medical: Not on file  Tobacco Use  . Smoking status: Former Smoker    Types: Cigarettes    Last attempt to quit: 10/13/2006    Years since quitting: 11.9  . Smokeless tobacco: Never Used  Substance and Sexual Activity  . Alcohol use: Yes    Comment: occasional  . Drug use: No  . Sexual activity: Yes    Partners: Male    Comment: 1ST intercourse- 19, partners- 22, married- 16 yrs   Lifestyle  . Physical activity:    Days per week: Not on file    Minutes per session: Not on file  . Stress: Not on file  Relationships  . Social connections:    Talks on phone: Not on file    Gets together: Not on file    Attends religious service: Not on file    Active member of club or organization: Not on file    Attends meetings of clubs or organizations: Not on file    Relationship status: Not on file  . Intimate partner violence:    Fear of current or ex partner: Not on file    Emotionally abused: Not on file    Physically abused: Not on file    Forced sexual activity: Not on file  Other Topics Concern  . Not on file  Social History Narrative   She is married, 3 sons born 1979, 1981, 1984.  One daughter born in 79.   She is medically retired from the [employer] after a fall and a hip injury, around 2004.   Caffeinated beverage every other day x1   07/13/2017    Review of Systems:      Constitutional: No weight loss, fever or chills Skin: No rash  Cardiovascular: No chest pain Respiratory: No  SOB  Gastrointestinal: See HPI and otherwise negative Genitourinary: No dysuria  Neurological: No headache, dizziness or syncope Musculoskeletal: No new muscle or joint pain Hematologic: No bruising Psychiatric: No history of depression or anxiety   Physical Exam:  Vital signs: BP 116/74   Pulse 84   Ht 5' 2.5" (1.588 Ramirez)   Wt 159 lb 12.8 oz (72.5 kg)   BMI 28.76 kg/Ramirez    Constitutional:   Pleasant AA female appears to be in NAD, Well developed, Well nourished, alert and cooperative Respiratory: Respirations even and unlabored. Lungs clear to auscultation bilaterally.   No wheezes, crackles, or rhonchi.  Cardiovascular: Normal S1, S2. No MRG. Regular rate and rhythm. No peripheral edema, cyanosis or pallor.  Gastrointestinal:  Soft, nondistended, Marked ttp, worse in b/l lower quadrants with some involuntary guarding, No rebound or guarding. Normal bowel sounds. No appreciable masses or hepatomegaly. Rectal:  Not performed.  Psychiatric: Demonstrates good judgement and reason without abnormal affect or behaviors.  MOST RECENT LABS AND IMAGING: CBC    Component Value Date/Time   WBC 12.6 (H) 04/12/2018 1802   WBC 12.6 (H) 09/23/2017 1059   RBC 4.29 04/12/2018 1802   RBC 4.35 09/23/2017 1059   RBC 4.35 09/23/2017 1059   HGB 12.3 04/12/2018 1802   HCT 36.5 04/12/2018 1802   PLT 359 04/12/2018 1802   MCV 85 04/12/2018 1802   MCH 28.7 04/12/2018 1802   MCH 28.5 09/23/2017 1059   MCHC 33.7 04/12/2018 1802   MCHC 33.2 09/23/2017 1059   RDW 13.8 04/12/2018 1802   LYMPHSABS 3.9 (H) 04/12/2018 1802   MONOABS 0.9 09/23/2017 1059   EOSABS 1.0 (H) 04/12/2018 1802   BASOSABS 0.0 04/12/2018 1802    CMP     Component Value Date/Time   NA 145 (H) 04/12/2018 1802   K 4.2 04/12/2018 1802   CL 105 04/12/2018 1802   CO2 27 04/12/2018 1802   GLUCOSE 100 (H) 04/12/2018 1802    GLUCOSE 93 09/23/2017 1059   BUN 11 04/12/2018 1802   CREATININE 0.87 04/12/2018 1802   CREATININE 0.78 09/23/2017 1059   CALCIUM 9.5 04/12/2018 1802   PROT 6.6 04/12/2018 1802   ALBUMIN 3.9 04/12/2018 1802   AST 22 04/12/2018 1802   AST 17 09/23/2017 1059   ALT 30 04/12/2018 1802   ALT 23 09/23/2017 1059   ALKPHOS 97 04/12/2018 1802   BILITOT <0.2 04/12/2018 1802   BILITOT 0.5 09/23/2017 1059   GFRNONAA 75 04/12/2018 1802   GFRNONAA >60 09/23/2017 1059   GFRAA 86 04/12/2018 1802   GFRAA >60 09/23/2017 1059    Assessment: 1.  Ulcerative colitis with complication: Patient typically maintained on Lialda 4.8 g/day, see HPI, previously did well on Humira but concerns about getting this covered with her current insurance, last colon in 2017, some discussion about adding 6-MP, will leave this decision to Dr. Carlean Purl at time of follow-up  Plan: 1.  Ordered CBC, CMP, ESR and CRP 2.  Started Prednisone 40 mg daily x1 week then decrease by 5 mg/week for 2 months. 3.  Ordered C. difficile and GI pathogen panel 4.  Patient to continue Lialda 4.8 g for now.  She can discuss addition of 6-MP with Dr. Carlean Purl at follow-up. 5.  Scheduled patient to follow-up with Dr. Carlean Purl in the next 4-6 weeks, should be before she finishes her Prednisone taper. Reiterated how important follow up was for her.  Beverly Newer, PA-C [facility] 09/23/2018, 2:21 PM  Cc: Beverly Fontana  M, MD   Agree with [name]'s evaluation and management.  Beverly Mayer, MD, Beverly Ramirez

## 2018-09-24 ENCOUNTER — Ambulatory Visit (INDEPENDENT_AMBULATORY_CARE_PROVIDER_SITE_OTHER): Payer: Medicare Other | Admitting: Family Medicine

## 2018-09-24 ENCOUNTER — Encounter: Payer: Self-pay | Admitting: Family Medicine

## 2018-09-24 ENCOUNTER — Other Ambulatory Visit: Payer: Self-pay

## 2018-09-24 VITALS — BP 124/78 | HR 88 | Temp 98.4°F | Ht 62.5 in | Wt 159.6 lb

## 2018-09-24 DIAGNOSIS — M659 Synovitis and tenosynovitis, unspecified: Secondary | ICD-10-CM

## 2018-09-24 DIAGNOSIS — R739 Hyperglycemia, unspecified: Secondary | ICD-10-CM | POA: Diagnosis not present

## 2018-09-24 DIAGNOSIS — Z7952 Long term (current) use of systemic steroids: Secondary | ICD-10-CM | POA: Diagnosis not present

## 2018-09-24 MED ORDER — BLOOD GLUCOSE MONITOR KIT: PACK | 0 refills | Status: DC

## 2018-09-24 NOTE — Progress Notes (Signed)
1/17/20203:11 PM  Beverly Ramirez 04-24-1961, 58 y.o. female [id number]  Chief Complaint  Patient presents with  . Pain    pain and swelling in the left arm with swelling in the wrist. Went to ER  for a knot on the same are, was given antibiotics. Knot never went away    HPI:   Patient is a 58 y.o. female with past medical history significant for UC who presents today for swelling of left arm  Seen in ER 08/15/18 - treated for cellulitis of LUE 2/2 insect bite, treated w keflex Saw GI yesterday, started on pred taper over 2 months In the past when she has been on steroids for so long her sugars have gone high > 500 and has needed insulin   Has seen sleep - plan for split sleep study  Has seen ortho, told she had severe carpal tunnel, has not seen them back  Fall Risk  09/24/2018 07/16/2018 05/07/2018 04/12/2018 03/09/2018  Falls in the past year? 0 0 No No No  Number falls in past yr: - - - - -  Injury with Fall? - - - - -     Depression screen [facility] 2/9 09/24/2018 07/16/2018 06/19/2018  Decreased Interest 0 0 0  Down, Depressed, Hopeless 0 0 0  PHQ - 2 Score 0 0 0    Allergies  Allergen Reactions  . Codeine Itching, Rash and Hives  . Latex Hives    With the power    Prior to Admission medications   Medication Sig Start Date End Date Taking? Authorizing Provider  amLODipine (NORVASC) 5 MG tablet Take 1 tablet (5 mg total) by mouth daily. 05/07/18  Yes Rutherford Guys, MD  cyclobenzaprine (FLEXERIL) 5 MG tablet Take 1-2 tablets (5-10 mg total) by mouth 3 (three) times daily as needed. 06/19/18  Yes Wendie Agreste, MD  ezetimibe (ZETIA) 10 MG tablet Take 1 tablet (10 mg total) by mouth daily. 04/12/18  Yes Rutherford Guys, MD  predniSONE (DELTASONE) 10 MG tablet Take 4 tablets (40 mg total) by mouth daily with breakfast for 7 days, THEN 3.5 tablets (35 mg total) daily with breakfast for 7 days, THEN 3 tablets (30 mg total) daily with breakfast for 7 days, THEN 2.5 tablets  (25 mg total) daily with breakfast for 7 days, THEN 2 tablets (20 mg total) daily with breakfast for 7 days, THEN 1.5 tablets (15 mg total) daily with breakfast for 7 days, THEN 1 tablet (10 mg total) daily with breakfast for 7 days, THEN 0.5 tablets (5 mg total) daily with breakfast for 7 days. 09/23/18 11/18/18 Yes Levin Erp, PA  mesalamine (LIALDA) 1.2 g EC tablet Take 4 tablets (4.8 g total) by mouth daily with breakfast. 04/12/18 07/11/18  Rutherford Guys, MD    Past Medical History:  Diagnosis Date  . Anemia   . Carpal tunnel syndrome    bilateral  . Chronic heel pain   . DJD (degenerative joint disease) of cervical spine    MRI 2017  . Hyperlipidemia   . Plantar fasciitis    bilateral  . UC (ulcerative colitis) ([place])     Past Surgical History:  Procedure Laterality Date  . ABDOMINAL HYSTERECTOMY    . APPENDECTOMY    . CESAREAN SECTION     x4  . COLONOSCOPY     Multiple in [place]  . ESOPHAGOGASTRODUODENOSCOPY      Social History   Tobacco Use  . Smoking status: Former  Smoker    Types: Cigarettes    Last attempt to quit: 10/13/2006    Years since quitting: 11.9  . Smokeless tobacco: Never Used  Substance Use Topics  . Alcohol use: Yes    Comment: occasional    Family History  Problem Relation Age of Onset  . Stroke Mother   . Diabetes Mother   . Hypertension Mother   . Lung cancer Mother   . Sickle cell trait Mother   . Breast cancer Maternal Aunt   . Diabetes Maternal Aunt   . Lung cancer Maternal Aunt   . Lung cancer Maternal Aunt   . Lung cancer Maternal Aunt   . Colon cancer Cousin 38  . Sickle cell trait Father   . Sickle cell anemia Sister 29  . Lung cancer Maternal Grandmother   . Sickle cell anemia Sister   . Other Sister        accident and blood clot formed    ROS Per hpi  OBJECTIVE:  Blood pressure 124/78, pulse 88, temperature 98.4 F (36.9 C), temperature source Oral, height 5' 2.5" (1.588 m), weight 159 lb 9.6 oz (72.4  kg), SpO2 97 %. Body mass index is 28.73 kg/m.   Physical Exam Vitals signs and nursing note reviewed.  Constitutional:      Appearance: She is well-developed.  HENT:     Head: Normocephalic and atraumatic.     Mouth/Throat:     Pharynx: No oropharyngeal exudate.  Eyes:     General: No scleral icterus.    Conjunctiva/sclera: Conjunctivae normal.     Pupils: Pupils are equal, round, and reactive to light.  Neck:     Musculoskeletal: Neck supple.  Cardiovascular:     Rate and Rhythm: Normal rate and regular rhythm.     Heart sounds: Normal heart sounds. No murmur. No friction rub. No gallop.   Pulmonary:     Effort: Pulmonary effort is normal.     Breath sounds: Normal breath sounds. No wheezing or rales.  Musculoskeletal:     Left wrist: She exhibits tenderness (over radial side, + finkelstein, no erythema nor warmth) and swelling.  Skin:    General: Skin is warm and dry.  Neurological:     Mental Status: She is alert and oriented to person, place, and time.     ASSESSMENT and PLAN  1. Tenosynovitis of thumb Placed on spica splint, Discussed supportive measures, new and RTC precautions. Patient educational handout given. - Thumb spica  2. Hyperglycemia 3. Long term systemic steroid user Start checking cbgs, return if > 300 Other orders - blood glucose meter kit and supplies KIT; Based on patient's insurance. Check twice a day. Dx R73.9, Z79.52  Return for after sleep study.    Rutherford Guys, MD [facility] [place], [place] [address] Ph.  [phone] Fax [phone]

## 2018-09-24 NOTE — Patient Instructions (Addendum)
If you have lab work done today you will be contacted with your lab results within the next 2 weeks.  If you have not heard from [place] then please contact us. The fastest way to get your results is to register for My Chart.   IF you received an x-ray today, you will receive an invoice from [facility]. Please contact [facility] at [phone] with questions or concerns regarding your invoice.   IF you received labwork today, you will receive an invoice from [place]. Please contact [facility] at [phone] with questions or concerns regarding your invoice.   Our billing staff will not be able to assist you with questions regarding bills from these companies.  You will be contacted with the lab results as soon as they are available. The fastest way to get your results is to activate your My Chart account. Instructions are located on the last page of this paperwork. If you have not heard from [place] regarding the results in 2 weeks, please contact this office.      Tenosynovitis Tenosynovitis is inflammation of a tendon and the sleeve of tissue that covers the tendon (tendon sheath). A tendon is a cord of tissue that connects muscle to bone. Normally, a tendon slides smoothly inside its tendon sheath. Tenosynovitis limits movement of the tendon and surrounding tissues, which may cause pain and stiffness. Tenosynovitis can affect any tendon and tendon sheath. Commonly affected areas include tendons in the:  Shoulder.  Arm.  Hand.  Hip.  Leg.  Foot. What are the causes? The main cause of this condition is wear and tear over time that results in slight tears in the tendon. Other possible causes include:  A sudden injury to the tendon or tendon sheath.  A disease that causes inflammation in the body.  An infection that spreads to the tendon and tendon sheath from a skin wound.  An infection in another part of the body that spreads to the tendon and tendon sheath  through the blood. What increases the risk? The following factors may make you more likely to develop this condition:  Having rheumatoid arthritis, gout, or diabetes.  Using IV drugs.  Doing physical activities that can cause tendon overuse and stress.  Having gonorrhea. What are the signs or symptoms? Symptoms of this condition depend on the cause. Symptoms may include:  Pain with movement.  Pain and tenderness when pressing on the tendon and tendon sheath.  Swelling.  Stiffness. If tenosynovitis is caused by an infection, symptoms may also include:  Fever.  Redness.  Warmth. How is this diagnosed? This condition may be diagnosed based on your medical history and a physical exam. You also may have:  Blood tests.  Imaging tests, such as: ? MRI. ? Ultrasound.  A sample of fluid removed from inside the tendon sheath to be checked in a lab. How is this treated? Treatment for this condition depends on the cause. If tenosynovitis is not caused by an infection, treatment may include:  Resting the tendon.  Keeping the tendon in place for periods of time (immobilization) in a splint, brace, or sling.  NSAIDs to reduce pain and swelling.  A shot (injection) of medicine to help reduce pain and swelling (steroid).  Icing or applying heat to the affected area.  Physical therapy.  Surgery to release the tendon in the sheath or to repair damage to the tendon or tendon sheath. Surgery may be done if other treatments do not help relieve symptoms.  If tenosynovitis is caused by infection, treatment may include antibiotic medicine given through an IV. In some cases, surgery may be needed to drain fluid from the tendon sheath or to remove the tendon sheath. Follow these instructions at home: If you have a splint, brace, or sling:   Wear the splint, brace, or sling as told by your health care provider. Remove it only as told by your health care provider.  Loosen the splint,  brace, or sling if your fingers or toes tingle, become numb, or turn cold and blue.  Do not let your splint or brace get wet if it is not waterproof. ? Do not take baths, swim, or use a hot tub until your health care provider approves. Ask your health care provider if you can take showers. ? If your splint, brace, or sling is not waterproof, cover it with a watertight covering when you take a bath or a shower.  Keep the splint, brace, or sling clean. Managing pain, stiffness, and swelling  If directed, apply ice to the affected area. ? Put ice in a plastic bag. ? Place a towel between your skin and the bag. ? Leave the ice on for 20 minutes, 2-3 times a day.  Move the fingers or toes of the affected limb often, if this applies. This can help to prevent stiffness and lessen swelling.  If directed, raise (elevate) the affected area above the level of your heart while you are sitting or lying down.  If directed, apply heat to the affected area as often as told by your health care provider. Use the heat source that your health care provider recommends, such as a moist heat pack or a heating pad. ? Place a towel between your skin and the heat source. ? Leave the heat on for 20-30 minutes. ? Remove the heat if your skin turns bright red. This is especially important if you are unable to feel pain, heat, or cold. You may have a greater risk of getting burned. Driving  Do not drive or operate heavy machinery while taking prescription pain medicine.  Ask your health care provider when it is safe to drive if you have a splint or brace on any part of your arm or leg. Activity  Return to your normal activities as told by your health care provider. Ask your health care provider what activities are safe for you.  Rest the affected area as told by your health care provider.  Avoid using the affected area while you are having symptoms of tenosynovitis.  If physical therapy was prescribed, do  exercises as told by your health care provider. Safety  Do not use the injured limb to support your body weight until your health care provider says that you can. General instructions  Take over-the-counter and prescription medicines only as told by your health care provider.  Keep all follow-up visits as told by your health care provider. This is important. Contact a health care provider if:  Your symptoms are not improving or are getting worse.  You have a fever and more of any of the following symptoms: ? Pain. ? Redness. ? Warmth. ? Swelling. Get help right away if:  Your fingers or toes become numb or turn blue. This information is not intended to replace advice given to you by your health care provider. Make sure you discuss any questions you have with your health care provider. Document Released: 08/25/2005 Document Revised: 04/24/2016 Document Reviewed: 07/04/2015 Elsevier Interactive Patient Education  2019 [place].

## 2018-09-27 LAB — GASTROINTESTINAL PATHOGEN PANEL PCR
C. difficile Tox A/B, PCR: NOT DETECTED
CAMPYLOBACTER, PCR: NOT DETECTED
Cryptosporidium, PCR: NOT DETECTED
E coli (ETEC) LT/ST PCR: NOT DETECTED
E coli (STEC) stx1/stx2, PCR: NOT DETECTED
E coli 0157, PCR: NOT DETECTED
Giardia lamblia, PCR: NOT DETECTED
Norovirus, PCR: NOT DETECTED
Rotavirus A, PCR: NOT DETECTED
SHIGELLA, PCR: NOT DETECTED
Salmonella, PCR: NOT DETECTED

## 2018-09-27 LAB — CLOSTRIDIUM DIFFICILE TOXIN B, QUALITATIVE, REAL-TIME PCR: Toxigenic C. Difficile by PCR: NOT DETECTED

## 2018-10-02 ENCOUNTER — Telehealth: Payer: Self-pay | Admitting: Family Medicine

## 2018-10-02 DIAGNOSIS — Z7952 Long term (current) use of systemic steroids: Secondary | ICD-10-CM

## 2018-10-02 DIAGNOSIS — Z5181 Encounter for therapeutic drug level monitoring: Principal | ICD-10-CM

## 2018-10-02 MED ORDER — INSULIN REGULAR HUMAN 100 UNIT/ML IJ SOLN: INTRAMUSCULAR | 1 refills | Status: DC

## 2018-10-02 NOTE — Telephone Encounter (Signed)
Pt called in to let us know

## 2018-10-02 NOTE — Telephone Encounter (Signed)
Started on steroid 1/172020 She states that she has been getting readings of 300 to 400s She states that she gets dizziness with her elevated readings She states that she has a long term course of steroid  Advised pt to use sliding scale insulin Discussed signs of hyperglycemia Discussed hypoglycemia  Come back in to the office for a follow up if sugars are remaining in the 400s

## 2018-10-04 ENCOUNTER — Other Ambulatory Visit: Payer: Self-pay | Admitting: Family Medicine

## 2018-10-04 MED ORDER — INSULIN REGULAR HUMAN 100 UNIT/ML IJ SOLN: 8.0000 [IU] | Freq: Three times a day (TID) | INTRAMUSCULAR | 2 refills | Status: DC

## 2018-10-04 NOTE — Progress Notes (Signed)
Insurance not covering humalin r Sent rx for novolin r

## 2018-10-08 ENCOUNTER — Telehealth: Payer: Self-pay | Admitting: Family Medicine

## 2018-10-08 NOTE — Telephone Encounter (Signed)
Left message for pt. - need to know what size insulin syringes she uses. Nothing on medication list.

## 2018-10-08 NOTE — Telephone Encounter (Signed)
Copied from [place] [id number]. Topic: Quick Communication - Rx Refill/Question >> Oct 08, 2018  2:32 PM Virl Axe D wrote: Medication: Pt stated Dr. Pamella Pert sent in rx for her insulin on 10/04/18 however she did not send in rx for pens. Pt does not have any. Requesting rx be sent to pharmacy for pens. Please advise pt once this is done.  Has the patient contacted their pharmacy? Yes.   (Agent: If no, request that the patient contact the pharmacy for the refill.) (Agent: If yes, when and what did the pharmacy advise?)  Preferred Pharmacy (with phone number or street name): [id number] [place], [place] [phone](Phone) [phone](Fax)  Agent: Please be advised that RX refills may take up to 3 business days. We ask that you follow-up with your pharmacy.

## 2018-10-08 NOTE — Telephone Encounter (Signed)
Patient called, left VM to return call to the office. [facility] called and spoke to [place], [facility] who says there is no insulin syringes listed on her profile, so a new prescription will need to be sent over for generic insulin needles.

## 2018-10-14 ENCOUNTER — Other Ambulatory Visit: Payer: Self-pay | Admitting: Physician Assistant

## 2018-10-16 ENCOUNTER — Other Ambulatory Visit: Payer: Self-pay

## 2018-10-16 MED ORDER — "INSULIN SYRINGE 31G X 5/16"" 1 ML MISC": 3 refills | Status: DC

## 2018-10-16 NOTE — Telephone Encounter (Signed)
Rx sent to pharmacy   

## 2018-10-18 ENCOUNTER — Other Ambulatory Visit: Payer: Self-pay | Admitting: Family Medicine

## 2018-10-19 ENCOUNTER — Telehealth: Payer: Self-pay | Admitting: Neurology

## 2018-10-19 NOTE — Telephone Encounter (Signed)
We have attempted to call the patient 2 times to schedule sleep study. Patient has been unavailable at the phone numbers we have on file and has not returned our calls. At this point we will send a letter asking pt to please contact the sleep lab to schedule their sleep study. If patient calls back we will schedule them for their sleep study.

## 2018-10-21 ENCOUNTER — Ambulatory Visit: Payer: Medicare Other | Admitting: Internal Medicine

## 2018-10-28 ENCOUNTER — Ambulatory Visit: Payer: Medicare Other | Admitting: Internal Medicine

## 2018-11-10 ENCOUNTER — Other Ambulatory Visit: Payer: Self-pay

## 2018-11-10 ENCOUNTER — Ambulatory Visit (INDEPENDENT_AMBULATORY_CARE_PROVIDER_SITE_OTHER): Payer: Medicare Other | Admitting: Family Medicine

## 2018-11-10 ENCOUNTER — Encounter: Payer: Self-pay | Admitting: Family Medicine

## 2018-11-10 ENCOUNTER — Ambulatory Visit (INDEPENDENT_AMBULATORY_CARE_PROVIDER_SITE_OTHER): Payer: Medicare Other

## 2018-11-10 VITALS — BP 134/82 | HR 86 | Temp 97.7°F | Ht 62.0 in | Wt 164.8 lb

## 2018-11-10 DIAGNOSIS — D899 Disorder involving the immune mechanism, unspecified: Secondary | ICD-10-CM

## 2018-11-10 DIAGNOSIS — Z7952 Long term (current) use of systemic steroids: Secondary | ICD-10-CM | POA: Diagnosis not present

## 2018-11-10 DIAGNOSIS — R309 Painful micturition, unspecified: Secondary | ICD-10-CM | POA: Diagnosis not present

## 2018-11-10 DIAGNOSIS — Z23 Encounter for immunization: Secondary | ICD-10-CM

## 2018-11-10 DIAGNOSIS — M79642 Pain in left hand: Secondary | ICD-10-CM

## 2018-11-10 DIAGNOSIS — D849 Immunodeficiency, unspecified: Secondary | ICD-10-CM

## 2018-11-10 DIAGNOSIS — M659 Synovitis and tenosynovitis, unspecified: Secondary | ICD-10-CM

## 2018-11-10 DIAGNOSIS — K5190 Ulcerative colitis, unspecified, without complications: Secondary | ICD-10-CM | POA: Diagnosis not present

## 2018-11-10 DIAGNOSIS — R739 Hyperglycemia, unspecified: Secondary | ICD-10-CM | POA: Diagnosis not present

## 2018-11-10 DIAGNOSIS — M7989 Other specified soft tissue disorders: Secondary | ICD-10-CM | POA: Diagnosis not present

## 2018-11-10 DIAGNOSIS — M25532 Pain in left wrist: Secondary | ICD-10-CM | POA: Diagnosis not present

## 2018-11-10 LAB — POCT GLYCOSYLATED HEMOGLOBIN (HGB A1C): Hemoglobin A1C: 9.5 % — AB (ref 4.0–5.6)

## 2018-11-10 LAB — POCT URINALYSIS DIP (MANUAL ENTRY)
Bilirubin, UA: NEGATIVE
Glucose, UA: >=1000 mg/dL — AB
Ketones, POC UA: NEGATIVE mg/dL
Leukocytes, UA: NEGATIVE
Nitrite, UA: NEGATIVE
Protein Ur, POC: NEGATIVE mg/dL
Spec Grav, UA: 1.01 (ref 1.010–1.025)
Urobilinogen, UA: 1 E.U./dL
pH, UA: 6 (ref 5.0–8.0)

## 2018-11-10 LAB — POC MICROSCOPIC URINALYSIS (UMFC): Mucus: ABSENT

## 2018-11-10 LAB — GLUCOSE, POCT (MANUAL RESULT ENTRY): POC Glucose: 327 mg/dl — AB (ref 70–99)

## 2018-11-10 IMAGING — DX DG WRIST COMPLETE 3+V*L*
4 series · 4 of 4 positions shown · non-contrast
Comparison: [DATE]

CLINICAL DATA: Pain, swelling at base of left thumb and wrist.

EXAM:
LEFT WRIST - COMPLETE 3+ VIEW

[wrist pa]
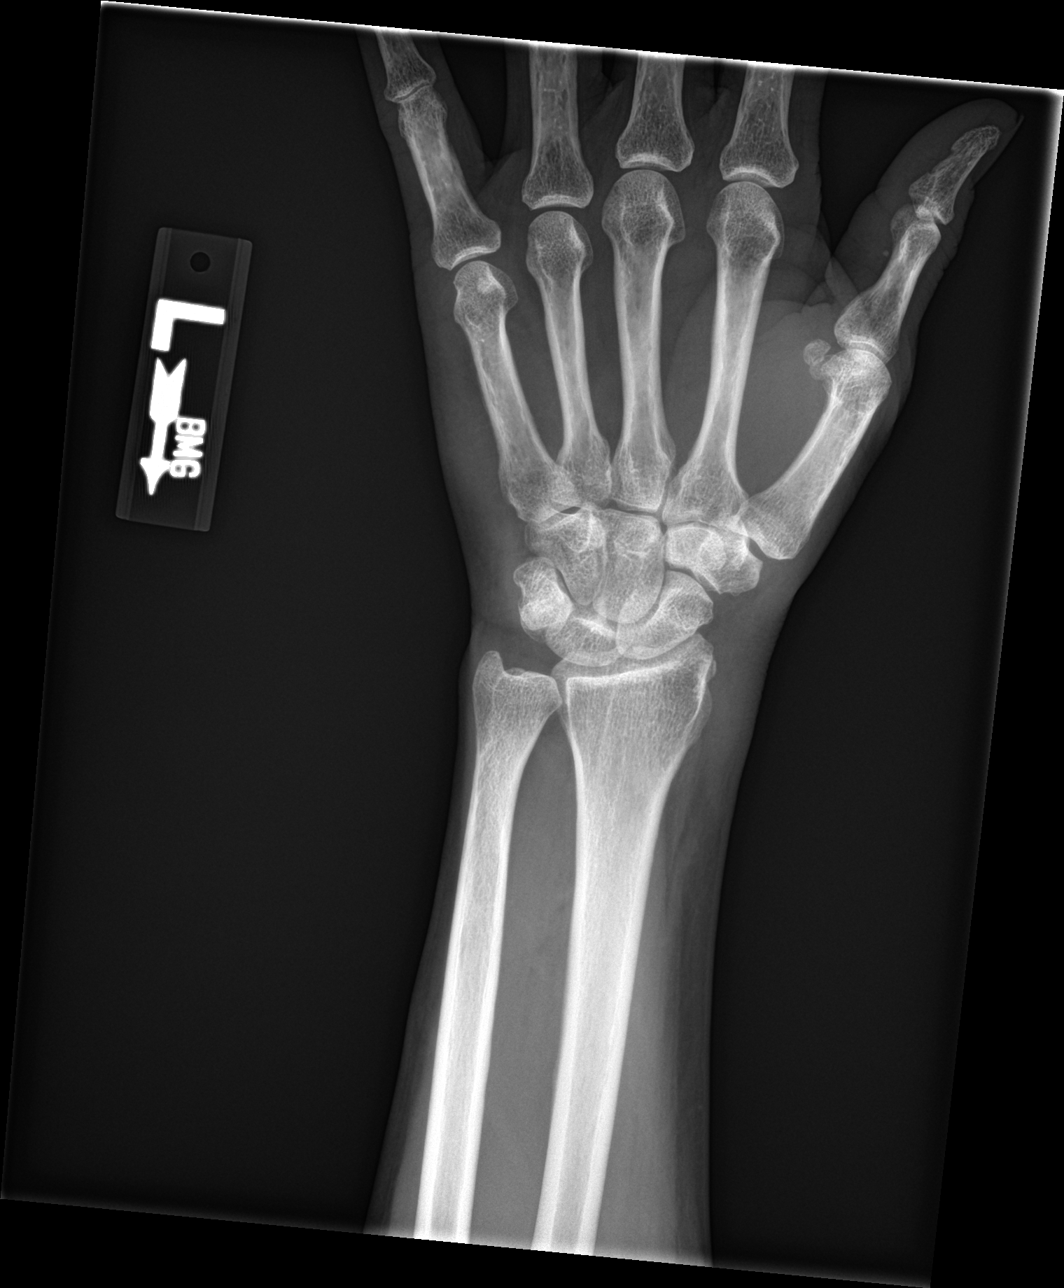

[wrist obl]
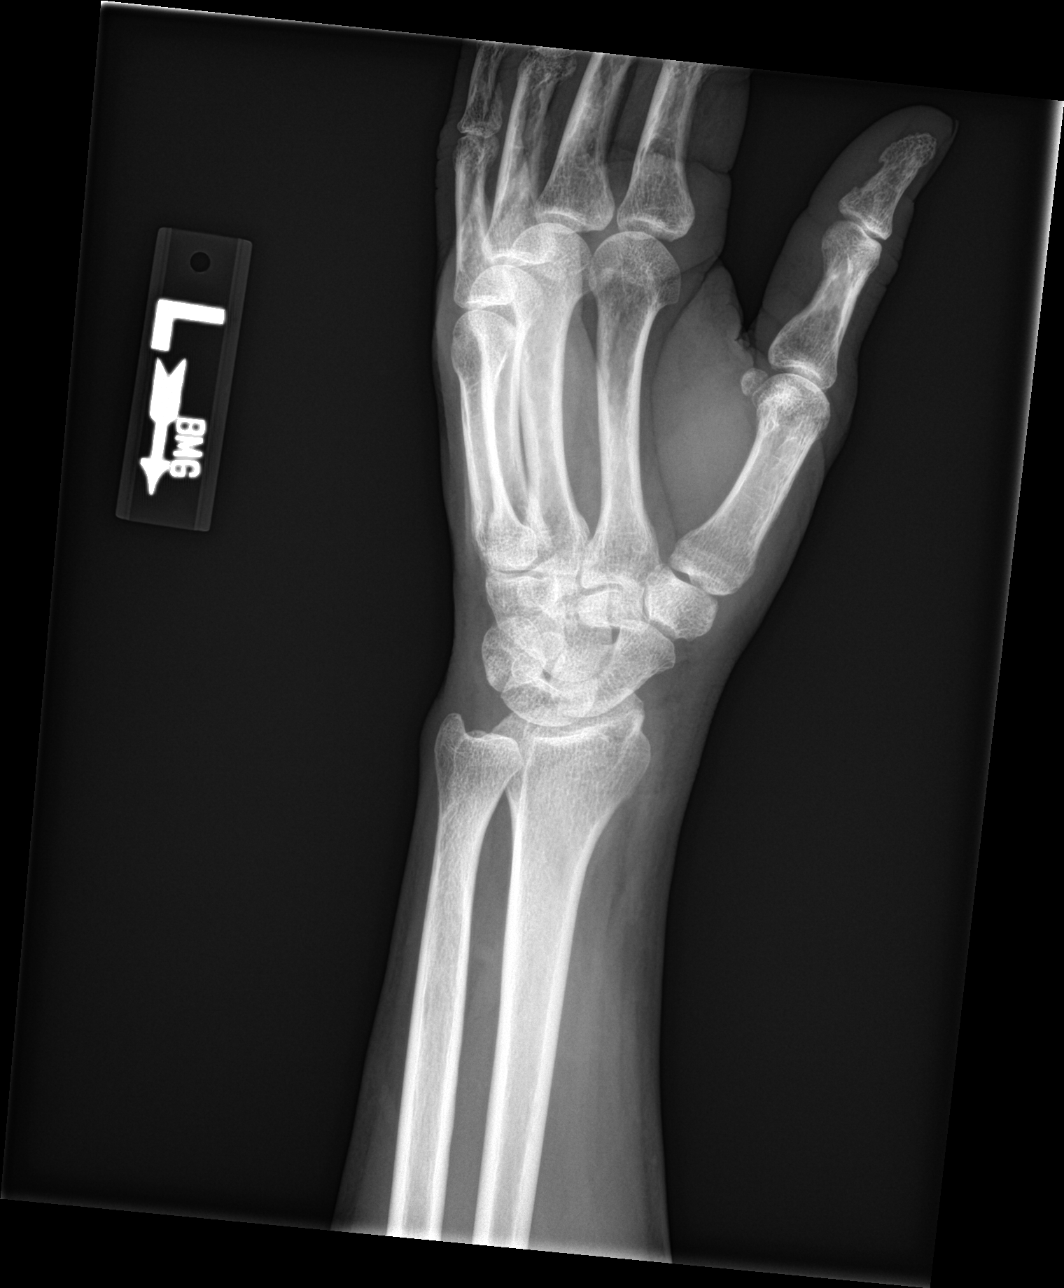

[wrist lat]
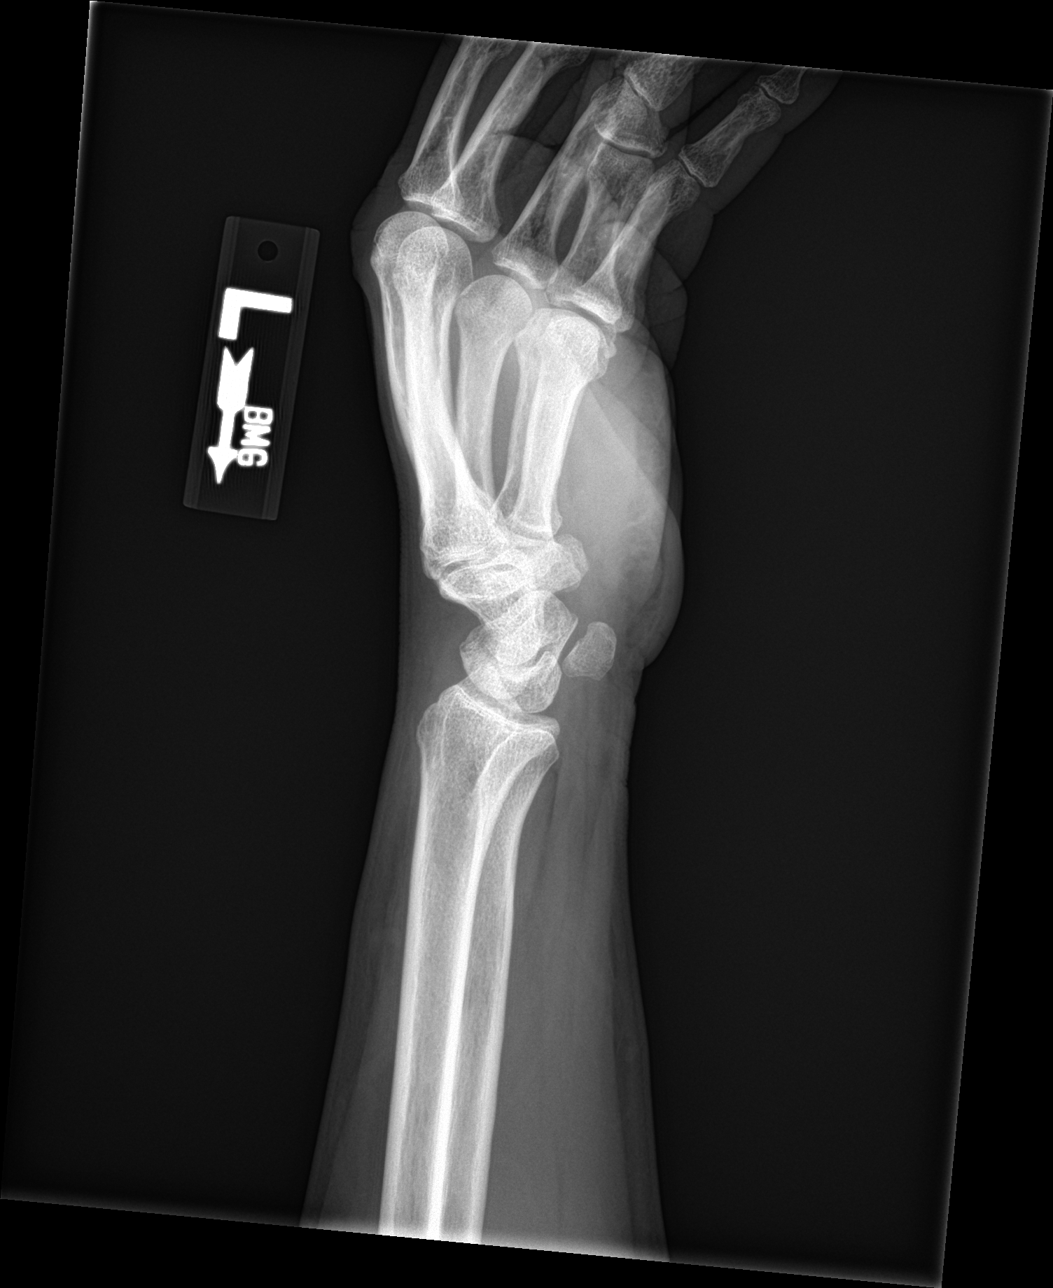

[wrist navicular]
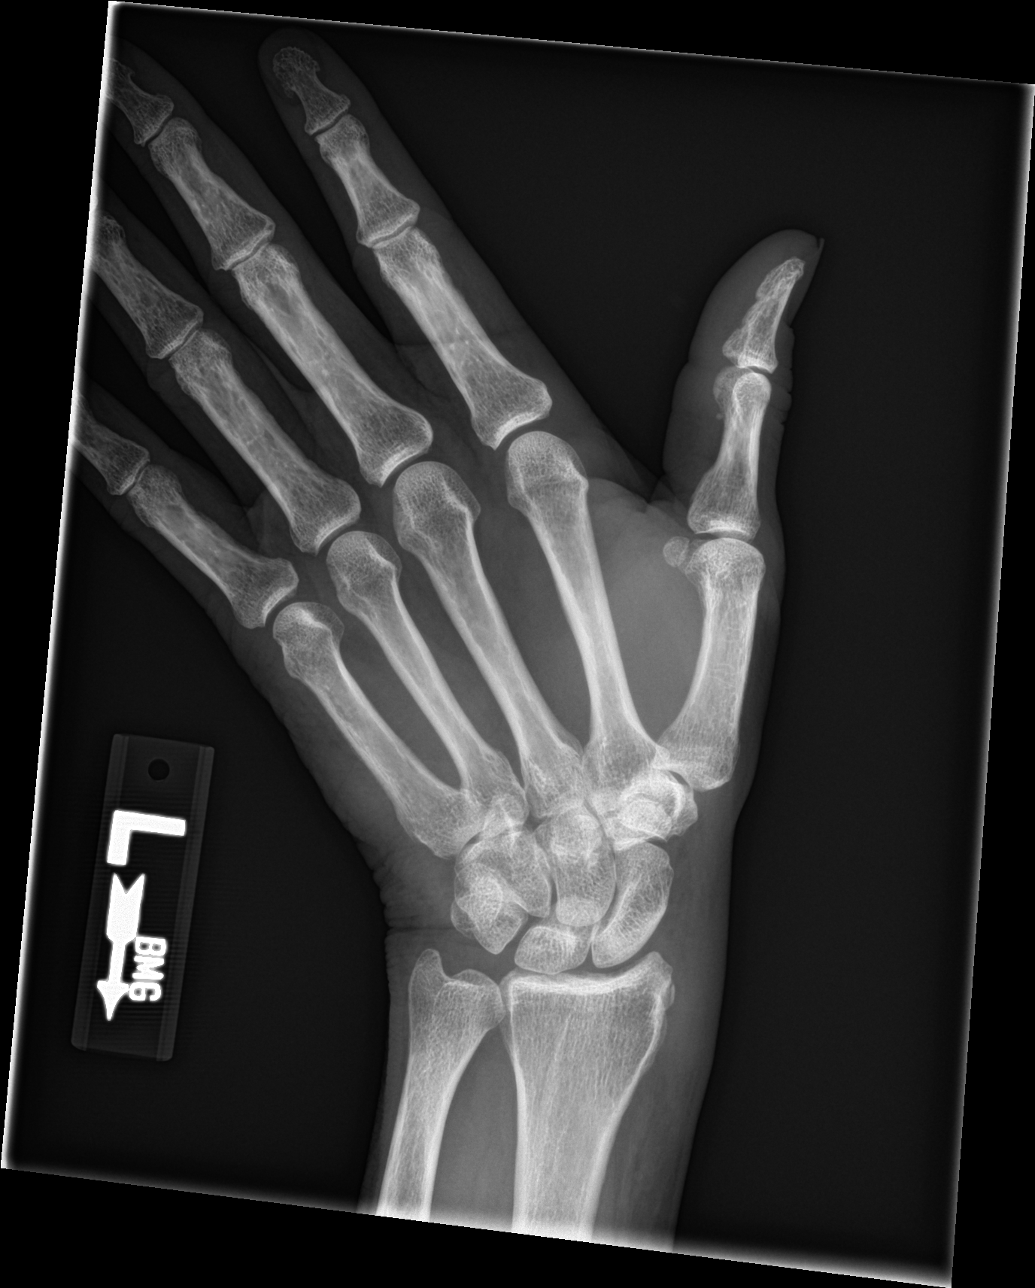

[4 of 4 positions shown; findings below may reference images not displayed]

FINDINGS: There is no evidence of fracture or dislocation. There is no
evidence of arthropathy or other focal bone abnormality. Soft
tissues are unremarkable.
IMPRESSION: Negative.

## 2018-11-10 MED ORDER — INSULIN GLARGINE 100 UNIT/ML SOLOSTAR PEN: 10.0000 [IU] | PEN_INJECTOR | Freq: Every day | SUBCUTANEOUS | 0 refills | Status: DC

## 2018-11-10 MED ORDER — TRAMADOL HCL 50 MG PO TABS: 50.0000 mg | ORAL_TABLET | Freq: Three times a day (TID) | ORAL | 0 refills | Status: DC | PRN

## 2018-11-10 NOTE — Patient Instructions (Addendum)
If you have lab work done today you will be contacted with your lab results within the next 2 weeks.  If you have not heard from [place] then please contact us. The fastest way to get your results is to register for My Chart.   IF you received an x-ray today, you will receive an invoice from [facility]. Please contact [facility] Radiology at [phone] with questions or concerns regarding your invoice.   IF you received labwork today, you will receive an invoice from [place]. Please contact [facility] at [phone] with questions or concerns regarding your invoice.   Our billing staff will not be able to assist you with questions regarding bills from these companies.  You will be contacted with the lab results as soon as they are available. The fastest way to get your results is to activate your My Chart account. Instructions are located on the last page of this paperwork. If you have not heard from [place] regarding the results in 2 weeks, please contact this office.    \ Hyperglycemia Hyperglycemia occurs when the level of sugar (glucose) in the blood is too high. Glucose is a type of sugar that provides the body's main source of energy. Certain hormones (insulin and glucagon) control the level of glucose in the blood. Insulin lowers blood glucose, and glucagon increases blood glucose. Hyperglycemia can result from having too little insulin in the bloodstream, or from the body not responding normally to insulin. Hyperglycemia occurs most often in people who have diabetes (diabetes mellitus), but it can happen in people who do not have diabetes. It can develop quickly, and it can be life-threatening if it causes you to become severely dehydrated (diabetic ketoacidosis or hyperglycemic hyperosmolar state). Severe hyperglycemia is a medical emergency. What are the causes? If you have diabetes, hyperglycemia may be caused by:  Diabetes medicine.  Medicines that increase blood glucose or  affect your diabetes control.  Not eating enough, or not eating often enough.  Changes in physical activity level.  Being sick or having an infection. If you have prediabetes or undiagnosed diabetes:  Hyperglycemia may be caused by those conditions. If you do not have diabetes, hyperglycemia may be caused by:  Certain medicines, including steroid medicines, beta-blockers, epinephrine, and thiazide diuretics.  Stress.  Serious illness.  Surgery.  Diseases of the pancreas.  Infection. What increases the risk? Hyperglycemia is more likely to develop in people who have risk factors for diabetes, such as:  Having a family member with diabetes.  Having a gene for type 1 diabetes that is passed from parent to child (inherited).  Living in an area with cold weather conditions.  Exposure to certain viruses.  Certain conditions in which the body's disease-fighting (immune) system attacks itself (autoimmune disorders).  Being overweight or obese.  Having an inactive (sedentary) lifestyle.  Having been diagnosed with insulin resistance.  Having a history of prediabetes, gestational diabetes, or polycystic ovarian syndrome (PCOS).  Being of American-Indian, African-American, Hispanic/Latino, or Asian/Pacific Islander descent. What are the signs or symptoms? Hyperglycemia may not cause any symptoms. If you do have symptoms, they may include early warning signs, such as:  Increased thirst.  Hunger.  Feeling very tired.  Needing to urinate more often than usual.  Blurry vision. Other symptoms may develop if hyperglycemia gets worse, such as:  Dry mouth.  Loss of appetite.  Fruity-smelling breath.  Weakness.  Unexpected or rapid weight gain or weight loss.  Tingling or numbness in the hands  or feet.  Headache.  Skin that does not quickly return to normal after being lightly pinched and released (poor skin turgor).  Abdominal pain.  Cuts or bruises that are  slow to heal. How is this diagnosed? Hyperglycemia is diagnosed with a blood test to measure your blood glucose level. This blood test is usually done while you are having symptoms. Your health care provider may also do a physical exam and review your medical history. You may have more tests to determine the cause of your hyperglycemia, such as:  A fasting blood glucose (FBG) test. You will not be allowed to eat (you will fast) for at least 8 hours before a blood sample is taken.  An A1c (hemoglobin A1c) blood test. This provides information about blood glucose control over the previous 2-3 months.  An oral glucose tolerance test (OGTT). This measures your blood glucose at two times: ? After fasting. This is your baseline blood glucose level. ? Two hours after drinking a beverage that contains glucose. How is this treated? Treatment depends on the cause of your hyperglycemia. Treatment may include:  Taking medicine to regulate your blood glucose levels. If you take insulin or other diabetes medicines, your medicine or dosage may be adjusted.  Lifestyle changes, such as exercising more, eating healthier foods, or losing weight.  Treating an illness or infection, if this caused your hyperglycemia.  Checking your blood glucose more often.  Stopping or reducing steroid medicines, if these caused your hyperglycemia. If your hyperglycemia becomes severe and it results in hyperglycemic hyperosmolar state, you must be hospitalized and given IV fluids. Follow these instructions at home:  General instructions  Take over-the-counter and prescription medicines only as told by your health care provider.  Do not use any products that contain nicotine or tobacco, such as cigarettes and e-cigarettes. If you need help quitting, ask your health care provider.  Limit alcohol intake to no more than 1 drink per day for nonpregnant women and 2 drinks per day for men. One drink equals 12 oz of beer, 5 oz  of wine, or 1 oz of hard liquor.  Learn to manage stress. If you need help with this, ask your health care provider.  Keep all follow-up visits as told by your health care provider. This is important. Eating and drinking   Maintain a healthy weight.  Exercise regularly, as directed by your health care provider.  Stay hydrated, especially when you exercise, get sick, or spend time in hot temperatures.  Eat healthy foods, such as: ? Lean proteins. ? Complex carbohydrates. ? Fresh fruits and vegetables. ? Low-fat dairy products. ? Healthy fats.  Drink enough fluid to keep your urine clear or pale yellow. If you have diabetes:  Make sure you know the symptoms of hyperglycemia.  Follow your diabetes management plan, as told by your health care provider. Make sure you: ? Take your insulin and medicines as directed. ? Follow your exercise plan. ? Follow your meal plan. Eat on time, and do not skip meals. ? Check your blood glucose as often as directed. Make sure to check your blood glucose before and after exercise. If you exercise longer or in a different way than usual, check your blood glucose more often. ? Follow your sick day plan whenever you cannot eat or drink normally. Make this plan in advance with your health care provider.  Share your diabetes management plan with people in your workplace, school, and household.  Check your urine for ketones when  you are ill and as told by your health care provider.  Carry a medical alert card or wear medical alert jewelry. Contact a health care provider if:  Your blood glucose is at or above 240 mg/dL (13.3 mmol/L) for 2 days in a row.  You have problems keeping your blood glucose in your target range.  You have frequent episodes of hyperglycemia. Get help right away if:  You have difficulty breathing.  You have a change in how you think, feel, or act (mental status).  You have nausea or vomiting that does not go away. These  symptoms may represent a serious problem that is an emergency. Do not wait to see if the symptoms will go away. Get medical help right away. Call your local emergency services (911 in the [place] Do not drive yourself to the hospital. Summary  Hyperglycemia occurs when the level of sugar (glucose) in the blood is too high.  Hyperglycemia is diagnosed with a blood test to measure your blood glucose level. This blood test is usually done while you are having symptoms. Your health care provider may also do a physical exam and review your medical history.  If you have diabetes, follow your diabetes management plan as told by your health care provider.  Contact your health care provider if you have problems keeping your blood glucose in your target range. This information is not intended to replace advice given to you by your health care provider. Make sure you discuss any questions you have with your health care provider. Document Released: 02/18/2001 Document Revised: 05/12/2016 Document Reviewed: 05/12/2016 Elsevier Interactive Patient Education  2019 [employer].

## 2018-11-10 NOTE — Progress Notes (Signed)
3/4/202011:46 AM  Beverly Ramirez 1961-02-20, 58 y.o. female [id number]  Chief Complaint  Patient presents with  . Urinary Frequency    1 wk  . left arm pain    worse this past 1 month     HPI:   Patient is a 58 y.o. female with past medical history significant for ulcerative colitis, HLP who presents today for painful urination and left arm pain  Last OV Jan 2020, dx with tenosynovitis of thumb Left thumb continues to be very painful, swelling of thumb and wrist, radiates up her forearm, burning pain Has been wearing brace, ice and heat without any relief  Had to start regular insulin while on long prednisone for UC Has a week left of pred, cbgs still high, has been missing insulin about once a day. Did take last night with dinenr She has been doing 30 units a time, denies any sweats shakiness, occ feels really tired afterwards - drinks OJ or eats chocolate which helps Had 30 units this morning Fasting glucose today 195 Sees GI in about 2 weeks  Has been a week of painful frequent urination No blood, no discharge No fever, chills, nausea, vomiting or abd pain  Has not had sleep study yet, needs to reschedule  Fall Risk  11/10/2018 09/24/2018 07/16/2018 05/07/2018 04/12/2018  Falls in the past year? 0 0 0 No No  Number falls in past yr: 0 - - - -  Injury with Fall? 0 - - - -  Follow up Falls evaluation completed - - - -     Depression screen [facility] 2/9 09/24/2018 07/16/2018 06/19/2018  Decreased Interest 0 0 0  Down, Depressed, Hopeless 0 0 0  PHQ - 2 Score 0 0 0    Allergies  Allergen Reactions  . Codeine Itching, Rash and Hives  . Latex Hives    With the power    Prior to Admission medications   Medication Sig Start Date End Date Taking? Authorizing Provider  amLODipine (NORVASC) 5 MG tablet Take 1 tablet (5 mg total) by mouth daily. 05/07/18  Yes Rutherford Guys, MD  blood glucose meter kit and supplies KIT Based on patient's insurance. Check twice a day. Dx  R73.9, Z79.52 09/24/18  Yes Rutherford Guys, MD  cyclobenzaprine (FLEXERIL) 5 MG tablet Take 1-2 tablets (5-10 mg total) by mouth 3 (three) times daily as needed. 06/19/18  Yes Wendie Agreste, MD  insulin regular (NOVOLIN R) 100 units/mL injection Inject 0.08 mLs (8 Units total) into the skin 3 (three) times daily before meals. 10/04/18  Yes Rutherford Guys, MD  Insulin Syringe-Needle U-100 (INSULIN SYRINGE 1CC/31GX5/16") 31G X 5/16" 1 ML MISC Inject 0.08 ( 8 Units)  into the skin 3 times daily before meals. 10/16/18  Yes Rutherford Guys, MD  [facility] DELICA LANCETS 08M MISC USE TO CHECK BLOOD SUGAR TWICE A DAY 10/19/18  Yes Rutherford Guys, MD  mesalamine (LIALDA) 1.2 g EC tablet Take 4 tablets (4.8 g total) by mouth daily with breakfast. 04/12/18 07/11/18  Rutherford Guys, MD    Past Medical History:  Diagnosis Date  . Anemia   . Carpal tunnel syndrome    bilateral  . Chronic heel pain   . DJD (degenerative joint disease) of cervical spine    MRI 2017  . Hyperlipidemia   . Plantar fasciitis    bilateral  . UC (ulcerative colitis) ([place])     Past Surgical History:  Procedure Laterality Date  . ABDOMINAL  HYSTERECTOMY    . APPENDECTOMY    . CESAREAN SECTION     x4  . COLONOSCOPY     Multiple in [place]  . ESOPHAGOGASTRODUODENOSCOPY      Social History   Tobacco Use  . Smoking status: Former Smoker    Types: Cigarettes    Last attempt to quit: 10/13/2006    Years since quitting: 12.0  . Smokeless tobacco: Never Used  Substance Use Topics  . Alcohol use: Yes    Comment: occasional    Family History  Problem Relation Age of Onset  . Stroke Mother   . Diabetes Mother   . Hypertension Mother   . Lung cancer Mother   . Sickle cell trait Mother   . Breast cancer Maternal Aunt   . Diabetes Maternal Aunt   . Lung cancer Maternal Aunt   . Lung cancer Maternal Aunt   . Lung cancer Maternal Aunt   . Colon cancer Cousin 35  . Sickle cell trait Father   . Sickle cell  anemia Sister 33  . Lung cancer Maternal Grandmother   . Sickle cell anemia Sister   . Other Sister        accident and blood clot formed    ROS Per hpi  OBJECTIVE:  Blood pressure 134/82, pulse 86, temperature 97.7 F (36.5 C), temperature source Oral, height 5' 2"  (1.575 m), weight 164 lb 12.8 oz (74.8 kg), SpO2 97 %. Body mass index is 30.14 kg/m.   Physical Exam Vitals signs and nursing note reviewed.  Constitutional:      Appearance: She is well-developed.  HENT:     Head: Normocephalic and atraumatic.     Mouth/Throat:     Pharynx: No oropharyngeal exudate.  Eyes:     General: No scleral icterus.    Conjunctiva/sclera: Conjunctivae normal.     Pupils: Pupils are equal, round, and reactive to light.  Neck:     Musculoskeletal: Neck supple.  Cardiovascular:     Rate and Rhythm: Normal rate and regular rhythm.     Heart sounds: Normal heart sounds. No murmur. No friction rub. No gallop.   Pulmonary:     Effort: Pulmonary effort is normal.     Breath sounds: Normal breath sounds. No wheezing or rales.  Musculoskeletal:     Left wrist: She exhibits tenderness and swelling (no erythema or warmth). She exhibits normal range of motion (but painful ).  Skin:    General: Skin is warm and dry.  Neurological:     Mental Status: She is alert and oriented to person, place, and time.       Results for orders placed or performed in visit on 11/10/18 (from the past 24 hour(s))  POCT urinalysis dipstick     Status: Abnormal   Collection Time: 11/10/18 12:00 PM  Result Value Ref Range   Color, UA yellow yellow   Clarity, UA clear clear   Glucose, UA >=1,000 (A) negative mg/dL   Bilirubin, UA negative negative   Ketones, POC UA negative negative mg/dL   Spec Grav, UA 1.010 1.010 - 1.025   Blood, UA trace-lysed (A) negative   pH, UA 6.0 5.0 - 8.0   Protein Ur, POC negative negative mg/dL   Urobilinogen, UA 1.0 0.2 or 1.0 E.U./dL   Nitrite, UA Negative Negative    Leukocytes, UA Negative Negative  POCT Microscopic Urinalysis ([facility])     Status: Abnormal   Collection Time: 11/10/18 12:07 PM  Result Value Ref  Range   WBC,UR,HPF,POC None None WBC/hpf   RBC,UR,HPF,POC None None RBC/hpf   Bacteria None None, Too numerous to count   Mucus Absent Absent   Epithelial Cells, UR Per Microscopy Few (A) None, Too numerous to count cells/hpf  POCT glucose (manual entry)     Status: Abnormal   Collection Time: 11/10/18 12:14 PM  Result Value Ref Range   POC Glucose 327 (A) 70 - 99 mg/dl  POCT A1C     Status: Abnormal   Collection Time: 11/10/18 12:24 PM  Result Value Ref Range   Hemoglobin A1C 9.5 (A) 4.0 - 5.6 %   HbA1c POC (<> result, manual entry)     HbA1c, POC (prediabetic range)     HbA1c, POC (controlled diabetic range)      Dg Wrist Complete Left  Result Date: 11/10/2018 CLINICAL DATA:  Pain, swelling at base of left thumb and wrist. EXAM: LEFT WRIST - COMPLETE 3+ VIEW COMPARISON:  04/12/2018 FINDINGS: There is no evidence of fracture or dislocation. There is no evidence of arthropathy or other focal bone abnormality. Soft tissues are unremarkable. IMPRESSION: Negative. Electronically Signed   By: Rolm Baptise M.D.   On: 11/10/2018 12:11    ASSESSMENT and PLAN  1. Painful urination No signs of infection. + glucose. See #3 - POCT urinalysis dipstick - POCT Microscopic Urinalysis ([facility])  2. Tenosynovitis of thumb Clinically suggestive however not relenting despite being on long term prednisone for UC. Xray normal. Referring to hand for further eval and treat. - DG Wrist Complete Left; Future  3. Hyperglycemia In setting of long course of prednisone. Adding lantus given elevated fastings. Discussed titration of basal and regular insulins. A1c elevated - has she developed DM2 ?? Will see if able to wean off insulin and next a1c. Consider endo referral.  - POCT glucose (manual entry) - POCT A1C  4. Ulcerative colitis without complications,  unspecified location ([place]) 5. Long term systemic steroid user 6. Immunosuppressed status ([place]) - DG Bone Density; Future   Other orders - Pneumococcal polysaccharide vaccine 23-valent greater than or equal to 2yo subcutaneous/IM - Insulin Glargine (LANTUS SOLOSTAR) 100 UNIT/ML Solostar Pen; Inject 10 Units into the skin at bedtime. - traMADol (ULTRAM) 50 MG tablet; Take 1 tablet (50 mg total) by mouth every 8 (eight) hours as needed.   Return in about 4 weeks (around 12/08/2018).    Rutherford Guys, MD [facility] at [facility], [place] [address] Ph.  [phone] Fax [phone]

## 2018-11-11 ENCOUNTER — Ambulatory Visit (HOSPITAL_COMMUNITY)
Admission: EM | Admit: 2018-11-11 | Discharge: 2018-11-11 | Disposition: A | Discharge status: Home / Self Care | Payer: Medicare Other | Attending: Internal Medicine | Admitting: Internal Medicine

## 2018-11-11 ENCOUNTER — Encounter (HOSPITAL_COMMUNITY): Payer: Self-pay | Admitting: Emergency Medicine

## 2018-11-11 DIAGNOSIS — E1165 Type 2 diabetes mellitus with hyperglycemia: Secondary | ICD-10-CM | POA: Diagnosis not present

## 2018-11-11 DIAGNOSIS — R5383 Other fatigue: Secondary | ICD-10-CM | POA: Diagnosis not present

## 2018-11-11 DIAGNOSIS — Z794 Long term (current) use of insulin: Secondary | ICD-10-CM

## 2018-11-11 LAB — GLUCOSE, CAPILLARY: Glucose-Capillary: 244 mg/dL — ABNORMAL HIGH (ref 70–99)

## 2018-11-11 MED ORDER — INSULIN ASPART PROT & ASPART (70-30 MIX) 100 UNIT/ML ~~LOC~~ SUSP: 20.0000 [IU] | Freq: Two times a day (BID) | SUBCUTANEOUS | 2 refills | Status: DC

## 2018-11-11 MED ORDER — INSULIN GLARGINE 100 UNIT/ML SOLOSTAR PEN: 20.0000 [IU] | PEN_INJECTOR | Freq: Every day | SUBCUTANEOUS | 0 refills | Status: DC

## 2018-11-11 NOTE — ED Triage Notes (Signed)
Pt sts hyperglycemia since being on pred for UC: pt sts taking insulin and prescribed a new insulin yesterday but was not able to pick up yet

## 2018-11-11 NOTE — ED Provider Notes (Signed)
[place]    CSN: [id number] Arrival date & time: 11/11/18  1204     History   Chief Complaint Chief Complaint  Patient presents with  . Hyperglycemia    HPI Bobetta Grunden-Hines is a 58 y.o. female recently diagnosed with diabetes mellitus likely type II currently on insulin (NovoLog 8 units AC meals) awaiting Lantus 10 units comes to the urgent care with complaints of increased fatigue blood sugar of 400 this morning.  Patient was recently seen by the primary care physician who recommended adding Lantus 10 units to her regimen of NovoLog 8 units before meals.  Patient feels somewhat thirsty.  She denies polyuria or polydipsia.  She feels fatigued and denies any blurry vision.  She comes in to have her insulin regimen rectified.  HPI  Past Medical History:  Diagnosis Date  . Anemia   . Carpal tunnel syndrome    bilateral  . Chronic heel pain   . DJD (degenerative joint disease) of cervical spine    MRI 2017  . Hyperlipidemia   . Plantar fasciitis    bilateral  . UC (ulcerative colitis) [facility])     Patient Active Problem List   Diagnosis Date Noted  . Leukocytosis 10/04/2017  . Hyperlipidemia 08/04/2017  . Ulcerative colitis without complications ([place]) 40/06/2724  . Plantar fasciitis 05/28/2017    Past Surgical History:  Procedure Laterality Date  . ABDOMINAL HYSTERECTOMY    . APPENDECTOMY    . CESAREAN SECTION     x4  . COLONOSCOPY     Multiple in [place]  . ESOPHAGOGASTRODUODENOSCOPY      OB History    Gravida  4   Para  4   Term      Preterm      AB      Living  4     SAB      TAB      Ectopic      Multiple      Live Births               Home Medications    Prior to Admission medications   Medication Sig Start Date End Date Taking? Authorizing Provider  amLODipine (NORVASC) 5 MG tablet Take 1 tablet (5 mg total) by mouth daily. 05/07/18   Rutherford Guys, MD  blood glucose meter kit and supplies KIT Based on  patient's insurance. Check twice a day. Dx R73.9, Z79.52 09/24/18   Rutherford Guys, MD  cyclobenzaprine (FLEXERIL) 5 MG tablet Take 1-2 tablets (5-10 mg total) by mouth 3 (three) times daily as needed. 06/19/18   Wendie Agreste, MD  Insulin Glargine (LANTUS SOLOSTAR) 100 UNIT/ML Solostar Pen Inject 20 Units into the skin at bedtime. 11/11/18   Lamptey, Myrene Galas, MD  insulin regular (NOVOLIN R) 100 units/mL injection Inject 0.08 mLs (8 Units total) into the skin 3 (three) times daily before meals. 10/04/18   Rutherford Guys, MD  Insulin Syringe-Needle U-100 (INSULIN SYRINGE 1CC/31GX5/16") 31G X 5/16" 1 ML MISC Inject 0.08 ( 8 Units)  into the skin 3 times daily before meals. 10/16/18   Rutherford Guys, MD  mesalamine (LIALDA) 1.2 g EC tablet Take 4 tablets (4.8 g total) by mouth daily with breakfast. 04/12/18 07/11/18  Rutherford Guys, MD  [facility] DELICA LANCETS 36U MISC USE TO CHECK BLOOD SUGAR TWICE A DAY 10/19/18   Rutherford Guys, MD  traMADol (ULTRAM) 50 MG tablet Take 1 tablet (50 mg total)  by mouth every 8 (eight) hours as needed. 11/10/18   Rutherford Guys, MD    Family History Family History  Problem Relation Age of Onset  . Stroke Mother   . Diabetes Mother   . Hypertension Mother   . Lung cancer Mother   . Sickle cell trait Mother   . Breast cancer Maternal Aunt   . Diabetes Maternal Aunt   . Lung cancer Maternal Aunt   . Lung cancer Maternal Aunt   . Lung cancer Maternal Aunt   . Colon cancer Cousin 107  . Sickle cell trait Father   . Sickle cell anemia Sister 24  . Lung cancer Maternal Grandmother   . Sickle cell anemia Sister   . Other Sister        accident and blood clot formed    Social History Social History   Tobacco Use  . Smoking status: Former Smoker    Types: Cigarettes    Last attempt to quit: 10/13/2006    Years since quitting: 12.0  . Smokeless tobacco: Never Used  Substance Use Topics  . Alcohol use: Yes    Comment: occasional  . Drug use: No      Allergies   Codeine and Latex   Review of Systems Review of Systems  Constitutional: Positive for fatigue. Negative for activity change, appetite change and fever.  HENT: Negative for ear discharge, ear pain, mouth sores, postnasal drip, sinus pressure, sinus pain and sore throat.   Eyes: Negative for pain, discharge and itching.  Respiratory: Negative for cough, chest tightness, shortness of breath and wheezing.   Genitourinary: Negative for dysuria, frequency and urgency.  Musculoskeletal: Negative for arthralgias and gait problem.  Skin: Negative for rash and wound.  Neurological: Positive for dizziness, weakness and light-headedness. Negative for numbness.  Psychiatric/Behavioral: Negative for agitation and confusion.     Physical Exam Triage Vital Signs ED Triage Vitals [11/11/18 1229]  Enc Vitals Group     BP 131/87     Pulse Rate (!) 57     Resp 18     Temp 98.1 F (36.7 C)     Temp Source Temporal     SpO2 100 %     Weight      Height      Head Circumference      Peak Flow      Pain Score 0     Pain Loc      Pain Edu?      Excl. in [place]?    No data found.  Updated Vital Signs BP 131/87 (BP Location: Right Arm)   Pulse (!) 57   Temp 98.1 F (36.7 C) (Temporal)   Resp 18   SpO2 100%   Visual Acuity Right Eye Distance:   Left Eye Distance:   Bilateral Distance:    Right Eye Near:   Left Eye Near:    Bilateral Near:     Physical Exam Constitutional:      Appearance: Normal appearance. She is ill-appearing. She is not diaphoretic.  HENT:     Right Ear: Tympanic membrane normal.     Left Ear: Tympanic membrane normal.     Nose: Nose normal.     Mouth/Throat:     Mouth: Mucous membranes are moist.  Eyes:     Conjunctiva/sclera: Conjunctivae normal.  Neck:     Musculoskeletal: Normal range of motion and neck supple.  Cardiovascular:     Rate and Rhythm: Normal rate and regular rhythm.  Pulses: Normal pulses.     Heart sounds: Normal  heart sounds.  Pulmonary:     Effort: Pulmonary effort is normal.     Breath sounds: Normal breath sounds.  Abdominal:     General: Bowel sounds are normal.     Palpations: Abdomen is soft.  Musculoskeletal: Normal range of motion.  Skin:    General: Skin is warm.     Capillary Refill: Capillary refill takes less than 2 seconds.     Coloration: Skin is not jaundiced.     Findings: No bruising or lesion.  Neurological:     General: No focal deficit present.     Mental Status: She is alert and oriented to person, place, and time. Mental status is at baseline.      UC Treatments / Results  Labs (all labs ordered are listed, but only abnormal results are displayed) Labs Reviewed  GLUCOSE, CAPILLARY - Abnormal; Notable for the following components:      Result Value   Glucose-Capillary 244 (*)    All other components within normal limits  CBG MONITORING, ED    EKG None  Radiology Dg Wrist Complete Left  Result Date: 11/10/2018 CLINICAL DATA:  Pain, swelling at base of left thumb and wrist. EXAM: LEFT WRIST - COMPLETE 3+ VIEW COMPARISON:  04/12/2018 FINDINGS: There is no evidence of fracture or dislocation. There is no evidence of arthropathy or other focal bone abnormality. Soft tissues are unremarkable. IMPRESSION: Negative. Electronically Signed   By: Rolm Baptise M.D.   On: 11/10/2018 12:11    Procedures Procedures (including critical care time)  Medications Ordered in UC Medications - No data to display  Initial Impression / Assessment and Plan / UC Course  I have reviewed the triage vital signs and the nursing notes.  Pertinent labs & imaging results that were available during my care of the patient were reviewed by me and considered in my medical decision making (see chart for details).     1.  Insulin-dependent diabetes mellitus with hyperglycemia: Increase Lantus to 20 units at bedtime Continue NovoLog 8 units with meals Hypoglycemia precautions  discussed Patient will benefit from outpatient diabetic education If securing Lantus is problematic, patient will be benefit from insulin 70/30 rather than short-acting insulin before meals Final Clinical Impressions(s) / UC Diagnoses   Final diagnoses:  Type 2 diabetes mellitus with hyperglycemia, with long-term current use of insulin [facility])   Discharge Instructions   None    ED Prescriptions    Medication Sig Dispense Auth. Provider   insulin aspart protamine- aspart (NOVOLOG MIX 70/30) (70-30) 100 UNIT/ML injection  (Status: Discontinued) Inject 0.2 mLs (20 Units total) into the skin 2 (two) times daily with a meal. 10 mL Lamptey, Myrene Galas, MD   Insulin Glargine (LANTUS SOLOSTAR) 100 UNIT/ML Solostar Pen Inject 20 Units into the skin at bedtime. 5 pen Lamptey, Myrene Galas, MD     Controlled Substance Prescriptions [place] Controlled Substance Registry consulted? No   Chase Picket, MD 11/11/18 [phone]

## 2018-11-16 ENCOUNTER — Ambulatory Visit (INDEPENDENT_AMBULATORY_CARE_PROVIDER_SITE_OTHER): Payer: Medicare Other | Admitting: Orthopaedic Surgery

## 2018-11-16 DIAGNOSIS — M654 Radial styloid tenosynovitis [de Quervain]: Secondary | ICD-10-CM | POA: Diagnosis not present

## 2018-11-16 DIAGNOSIS — D72829 Elevated white blood cell count, unspecified: Secondary | ICD-10-CM | POA: Diagnosis not present

## 2018-11-16 DIAGNOSIS — M79642 Pain in left hand: Secondary | ICD-10-CM

## 2018-11-16 DIAGNOSIS — E785 Hyperlipidemia, unspecified: Secondary | ICD-10-CM

## 2018-11-16 MED ORDER — BUPIVACAINE HCL 0.5 % IJ SOLN
0.3300 mL | INTRAMUSCULAR | Status: AC | PRN
  Administered 2018-11-16: .33 mL

## 2018-11-16 MED ORDER — LIDOCAINE HCL 1 % IJ SOLN
0.3000 mL | INTRAMUSCULAR | Status: AC | PRN
  Administered 2018-11-16: .3 mL

## 2018-11-16 NOTE — Progress Notes (Signed)
Office Visit Note   Patient: Beverly Ramirez           Date of Birth: 1960/09/27           MRN: [id number] Visit Date: 11/16/2018              Requested by: Rutherford Guys, MD [address] [place], [place] [address] PCP: Rutherford Guys, MD   Assessment & Plan: Visit Diagnoses:  1. De Quervain's tenosynovitis, left     Plan: Impression is severe left de Quervain's tenosynovitis.  Due to after mentioned reason she cannot undergo cortisone injection or do oral prednisone or NSAIDs.  Therefore we will try a Toradol injection to the first dorsal wrist compartment.  We will couple that with immobilization with thumb spica brace.  Patient instructed to follow-up if she is not better.  Follow-Up Instructions: No follow-ups on file.   Orders:  No orders of the defined types were placed in this encounter.  No orders of the defined types were placed in this encounter.     Procedures: Hand/UE Inj: L extensor compartment 1 for de Quervain's tenosynovitis on 11/16/2018 5:00 PM Indications: pain Details: 25 G needle Medications: 0.3 mL lidocaine 1 %; 0.33 mL bupivacaine 0.5 % Outcome: tolerated well, no immediate complications Patient was prepped and draped in the usual sterile fashion.       Clinical Data: No additional findings.   Subjective: Chief Complaint  Patient presents with  . Left Hand - Pain    Beverly Ramirez is a 58 year old female comes in with severe left wrist pain without any injuries.  She states that the pain radiates from the base of her thumb up to her forearm.  She states that she has been told that she had carpal tunnel in the past but not able to locate the nerve conduction studies.  She has been retired since 2010 and she is right-hand dominant.  She has ulcerative colitis.  She has recently been on chronic prednisone which has driven her H0W up to 9.5.  She denies any numbness and tingling.   Review of Systems  Constitutional: Negative.   HENT: Negative.    Eyes: Negative.   Respiratory: Negative.   Cardiovascular: Negative.   Endocrine: Negative.   Musculoskeletal: Negative.   Neurological: Negative.   Hematological: Negative.   Psychiatric/Behavioral: Negative.   All other systems reviewed and are negative.    Objective: Vital Signs: There were no vitals taken for this visit.  Physical Exam Vitals signs and nursing note reviewed.  Constitutional:      Appearance: She is well-developed.  HENT:     Head: Normocephalic and atraumatic.  Neck:     Musculoskeletal: Neck supple.  Pulmonary:     Effort: Pulmonary effort is normal.  Abdominal:     Palpations: Abdomen is soft.  Skin:    General: Skin is warm.     Capillary Refill: Capillary refill takes less than 2 seconds.  Neurological:     Mental Status: She is alert and oriented to person, place, and time.  Psychiatric:        Behavior: Behavior normal.        Thought Content: Thought content normal.        Judgment: Judgment normal.     Ortho Exam Left wrist exam shows mild swelling without any evidence of infection.  She is exhibiting pain that is out of proportion.  She is very tender over the radial styloid.  She is barely able  to perform a Finkelstein's test due to pain.  No subluxation of the tendons.  No numbness and tingling in the fingers. Specialty Comments:  No specialty comments available.  Imaging: No results found.   PMFS History: Patient Active Problem List   Diagnosis Date Noted  . De Quervain's tenosynovitis, left 11/16/2018  . Leukocytosis 10/04/2017  . Hyperlipidemia 08/04/2017  . Ulcerative colitis without complications ([place]) 63/84/5364  . Plantar fasciitis 05/28/2017   Past Medical History:  Diagnosis Date  . Anemia   . Carpal tunnel syndrome    bilateral  . Chronic heel pain   . DJD (degenerative joint disease) of cervical spine    MRI 2017  . Hyperlipidemia   . Plantar fasciitis    bilateral  . UC (ulcerative colitis) ([place])       Family History  Problem Relation Age of Onset  . Stroke Mother   . Diabetes Mother   . Hypertension Mother   . Lung cancer Mother   . Sickle cell trait Mother   . Breast cancer Maternal Aunt   . Diabetes Maternal Aunt   . Lung cancer Maternal Aunt   . Lung cancer Maternal Aunt   . Lung cancer Maternal Aunt   . Colon cancer Cousin 22  . Sickle cell trait Father   . Sickle cell anemia Sister 19  . Lung cancer Maternal Grandmother   . Sickle cell anemia Sister   . Other Sister        accident and blood clot formed    Past Surgical History:  Procedure Laterality Date  . ABDOMINAL HYSTERECTOMY    . APPENDECTOMY    . CESAREAN SECTION     x4  . COLONOSCOPY     Multiple in [place]  . ESOPHAGOGASTRODUODENOSCOPY     Social History   Occupational History  . Occupation: retired    Comment: He formerly worked for [employer], disabled due to a fall  Tobacco Use  . Smoking status: Former Smoker    Types: Cigarettes    Last attempt to quit: 10/13/2006    Years since quitting: 12.1  . Smokeless tobacco: Never Used  Substance and Sexual Activity  . Alcohol use: Yes    Comment: occasional  . Drug use: No  . Sexual activity: Yes    Partners: Male    Comment: 1ST intercourse- 89, partners- 46, married- 48 yrs

## 2018-11-19 ENCOUNTER — Other Ambulatory Visit: Payer: Self-pay | Admitting: Family Medicine

## 2018-11-19 NOTE — Telephone Encounter (Signed)
Requested Prescriptions  Pending Prescriptions Disp Refills  . Lancets (ONETOUCH DELICA PLUS [id number]) [place] [Pharmacy Med Name: ONE TOUCH DELICA PLUS 07H LANC] 219 each 3    Sig: USE TO [place] A DAY     Endocrinology: Diabetes - Testing Supplies Passed - 11/19/2018  3:28 PM      Passed - Valid encounter within last 12 months    Recent Outpatient Visits          1 week ago Painful urination   [facility], Lilia Argue, MD   1 month ago Tenosynovitis of thumb   [facility], Lilia Argue, MD   4 months ago Low back pain, unspecified back pain laterality, unspecified chronicity, unspecified whether sciatica present   [facility] at [facility], [place] D, PA-C   5 months ago Bilateral back pain, unspecified back location, unspecified chronicity   [facility], Ranell Patrick, MD   6 months ago Vertigo   [facility], Lilia Argue, MD

## 2018-11-24 ENCOUNTER — Telehealth: Payer: Self-pay

## 2018-11-24 NOTE — Telephone Encounter (Signed)
Covid-19 travel screening questions  Have you traveled in the last 14 days? NO If yes where?  Do you now or have you had a fever in the last 14 days? NO  Do you have any respiratory symptoms of shortness of breath or cough now or in the last 14 days? NO   Do you have any family members or close contacts with diagnosed or suspected Covid-19? NO

## 2018-11-25 ENCOUNTER — Other Ambulatory Visit: Payer: Self-pay

## 2018-11-25 ENCOUNTER — Ambulatory Visit (INDEPENDENT_AMBULATORY_CARE_PROVIDER_SITE_OTHER): Payer: Medicare Other | Admitting: Internal Medicine

## 2018-11-25 ENCOUNTER — Encounter: Payer: Self-pay | Admitting: Internal Medicine

## 2018-11-25 VITALS — BP 120/84 | HR 74 | Temp 98.3°F | Ht 62.0 in | Wt 162.0 lb

## 2018-11-25 DIAGNOSIS — K5100 Ulcerative (chronic) pancolitis without complications: Secondary | ICD-10-CM

## 2018-11-25 DIAGNOSIS — R21 Rash and other nonspecific skin eruption: Secondary | ICD-10-CM | POA: Diagnosis not present

## 2018-11-25 DIAGNOSIS — K121 Other forms of stomatitis: Secondary | ICD-10-CM

## 2018-11-25 MED ORDER — TRIAMCINOLONE ACETONIDE 0.1 % EX CREA: 1.0000 "application " | TOPICAL_CREAM | Freq: Two times a day (BID) | CUTANEOUS | 0 refills | Status: DC

## 2018-11-25 MED ORDER — TRIAMCINOLONE ACETONIDE 0.1 % EX CREA: 1.0000 "application " | TOPICAL_CREAM | Freq: Two times a day (BID) | CUTANEOUS | 3 refills | Status: DC

## 2018-11-25 MED ORDER — MESALAMINE 1.2 G PO TBEC: 2.4000 g | DELAYED_RELEASE_TABLET | Freq: Two times a day (BID) | ORAL | 3 refills | Status: DC

## 2018-11-25 NOTE — Assessment & Plan Note (Addendum)
Improved on steroids unfortunately is off her mesalamine.  She needs to restart this.  Cost of Biologics which she has been on in the past is an issue now that she is on Medicare. Get back on mesalamine, represcribed generic Lialda 4.8 g daily.  She indicates mouth ulcers and hand rashes trigger signal flares.  I am accustomed to mouth ulcers heralding a flare being associated but not her hand symptoms and signs.

## 2018-11-25 NOTE — Progress Notes (Signed)
Beverly Ramirez 58 y.o. 09/13/1960 [id number]  Assessment & Plan:   Ulcerative colitis without complications ([place]) Improved on steroids unfortunately is off her mesalamine.  She needs to restart this.  Cost of Biologics which she has been on in the past is an issue now that she is on Medicare. Get back on mesalamine, represcribed generic Lialda 4.8 g daily.  She indicates mouth ulcers and hand rashes trigger signal flares.  I am accustomed to mouth ulcers heralding a flare being associated but not her hand symptoms and signs.   Encounter Diagnoses  Name Primary?  . Ulcerative pancolitis without complication ([place]) Yes  . Rash and nonspecific skin eruption   . Mouth ulcers     Kenalog 0.1% generic for her rash.  Try to avoid steroids but if she needs to use these again we are limited due to cost considerations and her inability to afford Biologics.  Considering using 6-MP but she seems to be in clinical remission symptomatically at this time and in the midst of this coronavirus pandemic I do not want to start any new immunosuppression.  I will have her come back in 3 months or so to review things.  If her mouth ulcers flare she can let me know and we can try something oral for that.  Subjective:   Chief Complaint: Follow-up of ulcerative colitis  HPI The patient is here with a history of ulcerative colitis previously treated with Biologics in [place] but unable to afford those since moving to [place] and starting Medicare.  She saw Ellouise Newer in January and was treated with prednisone for a flare and is relatively asymptomatic no diarrhea no abdominal pain.  She feels like her tongue is sore and she has a dry scaly skin rash that she gets on her fingers that she thinks heralds flares. Allergies  Allergen Reactions  . Codeine Itching, Rash and Hives  . Latex Hives    With the power   Current Meds  Medication Sig  . amLODipine (NORVASC) 5 MG tablet Take 1 tablet  (5 mg total) by mouth daily.  . blood glucose meter kit and supplies KIT Based on patient's insurance. Check twice a day. Dx R73.9, Z79.52  . cyclobenzaprine (FLEXERIL) 5 MG tablet Take 1-2 tablets (5-10 mg total) by mouth 3 (three) times daily as needed.  . Insulin Glargine (LANTUS SOLOSTAR) 100 UNIT/ML Solostar Pen Inject 20 Units into the skin at bedtime.  . insulin regular (NOVOLIN R) 100 units/mL injection Inject 0.08 mLs (8 Units total) into the skin 3 (three) times daily before meals.  . Insulin Syringe-Needle U-100 (INSULIN SYRINGE 1CC/31GX5/16") 31G X 5/16" 1 ML MISC Inject 0.08 ( 8 Units)  into the skin 3 times daily before meals.  . Lancets (ONETOUCH DELICA PLUS QASTMH96Q) MISC USE TO CHECK BLOOD SUGAR TWICE A DAY  . traMADol (ULTRAM) 50 MG tablet Take 1 tablet (50 mg total) by mouth every 8 (eight) hours as needed.   Past Medical History:  Diagnosis Date  . Anemia   . Carpal tunnel syndrome    bilateral  . Chronic heel pain   . DJD (degenerative joint disease) of cervical spine    MRI 2017  . Hyperlipidemia   . Plantar fasciitis    bilateral  . Tendonitis    left hand/wrist  . UC (ulcerative colitis) ([place])    Past Surgical History:  Procedure Laterality Date  . ABDOMINAL HYSTERECTOMY    . APPENDECTOMY    . CESAREAN  SECTION     x4  . COLONOSCOPY     Multiple in [place]  . ESOPHAGOGASTRODUODENOSCOPY     Social History   Social History Narrative   She is married, 3 sons born 1979, 1981, 1984.  One daughter born in 55.   She is medically retired from the [employer] after a fall and a hip injury, around 2004.   Caffeinated beverage every other day x1   07/13/2017   family history includes Breast cancer in her maternal aunt; Colon cancer (age of onset: 23) in her cousin; Diabetes in her maternal aunt and mother; Hypertension in her mother; Lung cancer in her maternal aunt, maternal aunt, maternal aunt, maternal grandmother, and mother; Other in  her sister; Sickle cell anemia in her sister; Sickle cell anemia (age of onset: 75) in her sister; Sickle cell trait in her father and mother; Stroke in her mother.   Review of Systems As per HPI hemoglobin A1c is over 9%  Objective:   Physical Exam _0  120/84 (BP Location: Left Arm, Patient Position: Sitting, Cuff Size: Normal)   Pulse 74   Temp 98.3 F (36.8 C)   Ht _1  (1.575 m)   Wt 162 lb (73.5 kg)   BMI 29.63 kg/m @  General:  NAD Eyes:   Anicteric Mouth: Left side of the tongue is scalloped Lungs:  clear Heart::  S1S2 no rubs, murmurs or gallops Abdomen:  soft and nontender, BS+ Ext:   no edema, cyanosis or clubbing the fingers have a dry slightly scaly rash diffusely     Data Reviewed:   See HPI

## 2018-11-25 NOTE — Patient Instructions (Addendum)
  We have sent the following medications to your pharmacy for you to pick up at your convenience: Lialda and triamcinolone cream for your rash  Follow up with your Dr about your diabetes.  If the mouth ulcers get bad call us back.  Call us in May for a June/July appointment.   I appreciate the opportunity to care for you. Silvano Rusk, MD, [facility]

## 2018-11-27 ENCOUNTER — Emergency Department (HOSPITAL_COMMUNITY)
Admission: EM | Admit: 2018-11-27 | Discharge: 2018-11-27 | Disposition: A | Discharge status: Home / Self Care | Payer: Medicare Other | Attending: Emergency Medicine | Admitting: Emergency Medicine

## 2018-11-27 ENCOUNTER — Other Ambulatory Visit: Payer: Self-pay

## 2018-11-27 ENCOUNTER — Encounter (HOSPITAL_COMMUNITY): Payer: Self-pay

## 2018-11-27 DIAGNOSIS — Z794 Long term (current) use of insulin: Secondary | ICD-10-CM | POA: Diagnosis not present

## 2018-11-27 DIAGNOSIS — J111 Influenza due to unidentified influenza virus with other respiratory manifestations: Secondary | ICD-10-CM | POA: Insufficient documentation

## 2018-11-27 DIAGNOSIS — M791 Myalgia, unspecified site: Secondary | ICD-10-CM | POA: Diagnosis not present

## 2018-11-27 DIAGNOSIS — R070 Pain in throat: Secondary | ICD-10-CM | POA: Diagnosis present

## 2018-11-27 DIAGNOSIS — Z87891 Personal history of nicotine dependence: Secondary | ICD-10-CM | POA: Diagnosis not present

## 2018-11-27 DIAGNOSIS — J029 Acute pharyngitis, unspecified: Secondary | ICD-10-CM | POA: Diagnosis not present

## 2018-11-27 DIAGNOSIS — Z79899 Other long term (current) drug therapy: Secondary | ICD-10-CM | POA: Insufficient documentation

## 2018-11-27 DIAGNOSIS — R6889 Other general symptoms and signs: Secondary | ICD-10-CM

## 2018-11-27 NOTE — ED Provider Notes (Signed)
[facility] DEPT Provider Note   CSN: [id number] Arrival date & time: 11/27/18  1438    History   Chief Complaint Chief Complaint  Patient presents with  . Generalized Body Aches    HPI    Beverly Ramirez is a 58 y.o. female with a PMHx of DM2, HLD, anemia, UC, and other conditions listed below, who presents to the ED with complaints of mild constant body aches, sore throat, and bilateral ear pain for the last 3 days.  She has tried Tylenol with some relief.  No known aggravating factors.  She also mentions having a mild cough with occasional yellow sputum production as well as some rhinorrhea.  She has not recently traveled nor been in contact with anyone with similar symptoms.  She denies any COVID 19 exposure.  She denies any fevers, chills, drooling, trismus, ear drainage, chest pain, shortness of breath, abdominal pain, nausea, vomiting, diarrhea, constipation, dysuria, hematuria, numbness, tingling, focal weakness, or any other complaints at this time.  The history is provided by the patient and medical records. No language interpreter was used.    Past Medical History:  Diagnosis Date  . Anemia   . Carpal tunnel syndrome    bilateral  . Chronic heel pain   . DJD (degenerative joint disease) of cervical spine    MRI 2017  . Hyperlipidemia   . Plantar fasciitis    bilateral  . Tendonitis    left hand/wrist  . UC (ulcerative colitis) [facility])     Patient Active Problem List   Diagnosis Date Noted  . De Quervain's tenosynovitis, left 11/16/2018  . Leukocytosis 10/04/2017  . Hyperlipidemia 08/04/2017  . Ulcerative colitis without complications ([place]) 34/91/7915  . Plantar fasciitis 05/28/2017    Past Surgical History:  Procedure Laterality Date  . ABDOMINAL HYSTERECTOMY    . APPENDECTOMY    . CESAREAN SECTION     x4  . COLONOSCOPY     Multiple in [place]  . ESOPHAGOGASTRODUODENOSCOPY       OB History    Gravida  4   Para   4   Term      Preterm      AB      Living  4     SAB      TAB      Ectopic      Multiple      Live Births               Home Medications    Prior to Admission medications   Medication Sig Start Date End Date Taking? Authorizing Provider  amLODipine (NORVASC) 5 MG tablet Take 1 tablet (5 mg total) by mouth daily. 05/07/18   Rutherford Guys, MD  blood glucose meter kit and supplies KIT Based on patient's insurance. Check twice a day. Dx R73.9, Z79.52 09/24/18   Rutherford Guys, MD  cyclobenzaprine (FLEXERIL) 5 MG tablet Take 1-2 tablets (5-10 mg total) by mouth 3 (three) times daily as needed. 06/19/18   Wendie Agreste, MD  Insulin Glargine (LANTUS SOLOSTAR) 100 UNIT/ML Solostar Pen Inject 20 Units into the skin at bedtime. 11/11/18   Lamptey, Myrene Galas, MD  insulin regular (NOVOLIN R) 100 units/mL injection Inject 0.08 mLs (8 Units total) into the skin 3 (three) times daily before meals. 10/04/18   Rutherford Guys, MD  Insulin Syringe-Needle U-100 (INSULIN SYRINGE 1CC/31GX5/16") 31G X 5/16" 1 ML MISC Inject 0.08 ( 8 Units)  into the  skin 3 times daily before meals. 10/16/18   Rutherford Guys, MD  Lancets [facility] DELICA PLUS QASTMH96Q) MISC USE TO CHECK BLOOD SUGAR TWICE A DAY 11/19/18   Rutherford Guys, MD  mesalamine (LIALDA) 1.2 g EC tablet Take 2 tablets (2.4 g total) by mouth 2 (two) times daily. 11/25/18 11/20/19  Gatha Mayer, MD  traMADol (ULTRAM) 50 MG tablet Take 1 tablet (50 mg total) by mouth every 8 (eight) hours as needed. 11/10/18   Rutherford Guys, MD  triamcinolone cream (KENALOG) 0.1 % Apply 1 application topically 2 (two) times daily. 11/25/18   Gatha Mayer, MD    Family History Family History  Problem Relation Age of Onset  . Stroke Mother   . Diabetes Mother   . Hypertension Mother   . Lung cancer Mother   . Sickle cell trait Mother   . Breast cancer Maternal Aunt   . Diabetes Maternal Aunt   . Lung cancer Maternal Aunt   . Lung cancer  Maternal Aunt   . Lung cancer Maternal Aunt   . Colon cancer Cousin 55  . Sickle cell trait Father   . Sickle cell anemia Sister 78  . Lung cancer Maternal Grandmother   . Sickle cell anemia Sister   . Other Sister        accident and blood clot formed    Social History Social History   Tobacco Use  . Smoking status: Former Smoker    Types: Cigarettes    Last attempt to quit: 10/13/2006    Years since quitting: 12.1  . Smokeless tobacco: Never Used  Substance Use Topics  . Alcohol use: Yes    Comment: occasional  . Drug use: No     Allergies   Codeine and Latex   Review of Systems Review of Systems  Constitutional: Negative for chills and fever.  HENT: Positive for ear pain, rhinorrhea and sore throat. Negative for ear discharge and trouble swallowing.   Respiratory: Positive for cough (mild). Negative for shortness of breath.   Cardiovascular: Negative for chest pain.  Gastrointestinal: Negative for abdominal pain, constipation, diarrhea, nausea and vomiting.  Genitourinary: Negative for dysuria and hematuria.  Musculoskeletal: Positive for myalgias (body aches). Negative for arthralgias.  Skin: Negative for color change.  Allergic/Immunologic: Positive for immunocompromised state (DM2).  Neurological: Negative for weakness and numbness.  Psychiatric/Behavioral: Negative for confusion.   All other systems reviewed and are negative for acute change except as noted in the HPI.    Physical Exam Updated Vital Signs BP (!) 140/96 (BP Location: Left Arm)   Pulse 85   Temp 98.7 F (37.1 C) (Oral)   Resp 18   SpO2 99%   Physical Exam Vitals signs and nursing note reviewed.  Constitutional:      General: She is not in acute distress.    Appearance: Normal appearance. She is well-developed. She is not toxic-appearing.     Comments: Afebrile, nontoxic, NAD, well appearing  HENT:     Head: Normocephalic and atraumatic.     Right Ear: Hearing, ear canal and external  ear normal. A middle ear effusion (serous) is present. Tympanic membrane is not injected, erythematous or bulging.     Left Ear: Hearing, ear canal and external ear normal. A middle ear effusion (serous) is present. Tympanic membrane is not injected, erythematous or bulging.     Ears:     Comments: Ears with mild serous effusion bilaterally but otherwise no TM injection/erythema/bulging, otherwise  clear bilaterally.     Nose: Rhinorrhea (very mild) present.     Mouth/Throat:     Mouth: Mucous membranes are moist.     Pharynx: Oropharynx is clear. Uvula midline. No pharyngeal swelling, oropharyngeal exudate, posterior oropharyngeal erythema or uvula swelling.     Tonsils: No tonsillar exudate or tonsillar abscesses.     Comments: Nose with very mild rhinorrhea. Oropharynx clear and moist, without uvular swelling or deviation, no trismus or drooling, no tonsillar swelling or erythema, no exudates.  Eyes:     General:        Right eye: No discharge.        Left eye: No discharge.     Conjunctiva/sclera: Conjunctivae normal.  Neck:     Musculoskeletal: Normal range of motion and neck supple.  Cardiovascular:     Rate and Rhythm: Normal rate and regular rhythm.     Pulses: Normal pulses.     Heart sounds: Normal heart sounds, S1 normal and S2 normal. No murmur. No friction rub. No gallop.   Pulmonary:     Effort: Pulmonary effort is normal. No respiratory distress.     Breath sounds: Normal breath sounds. No decreased breath sounds, wheezing, rhonchi or rales.     Comments: CTAB in all lung fields, no w/r/r, no hypoxia or increased WOB, speaking in full sentences, SpO2 99% on RA  Abdominal:     General: Bowel sounds are normal. There is no distension.     Palpations: Abdomen is soft. Abdomen is not rigid.     Tenderness: There is no abdominal tenderness. There is no right CVA tenderness, left CVA tenderness, guarding or rebound. Negative signs include Murphy's sign and McBurney's sign.   Musculoskeletal: Normal range of motion.  Skin:    General: Skin is warm and dry.     Findings: No rash.  Neurological:     Mental Status: She is alert and oriented to person, place, and time.     Sensory: Sensation is intact. No sensory deficit.     Motor: Motor function is intact.  Psychiatric:        Mood and Affect: Mood and affect normal.        Behavior: Behavior normal.      ED Treatments / Results  Labs (all labs ordered are listed, but only abnormal results are displayed) Labs Reviewed - No data to display  EKG None  Radiology No results found.  Procedures Procedures (including critical care time)  Medications Ordered in ED Medications - No data to display   Initial Impression / Assessment and Plan / ED Course  I have reviewed the triage vital signs and the nursing notes.  Pertinent labs & imaging results that were available during my care of the patient were reviewed by me and considered in my medical decision making (see chart for details).        58 y.o. female here with some flulike symptoms for the last 3 days.  No fevers or chills.  No recent travel or sick contacts.  On exam, clear lung exam, no rhonchi or rales, no wheezing, no hypoxia or increased work of breathing, afebrile and nontoxic-appearing, very mild rhinorrhea, throat completely clear, ears with some serous effusion but otherwise clear.  Suspect allergic component versus possible viral.  Doubt COVID 19 or need for testing at this time.  Patient is outside of the Tamiflu window, so will not proceed with flu testing at this point.  Doubt need for chest x-ray  at this time.  Very reassuring exam.  Advised patient to self quarantine until symptoms resolve or at least 7 days, but doubt need for further emergent work-up.  Advised OTC remedies for symptomatic care.  Follow-up with PCP in 1 week. I explained the diagnosis and have given explicit precautions to return to the ER including for any other new  or worsening symptoms. The patient understands and accepts the medical plan as it's been dictated and I have answered their questions. Discharge instructions concerning home care and prescriptions have been given. The patient is STABLE and is discharged to home in good condition.    Final Clinical Impressions(s) / ED Diagnoses   Final diagnoses:  Flu-like symptoms  Sore throat    ED Discharge Orders    [address] Izaias [address]upka, [place], [place] 11/27/18 [place], MD 12/01/18 2047

## 2018-11-27 NOTE — Discharge Instructions (Signed)
Continue to stay well-hydrated. Gargle warm salt water and spit it out and use chloraseptic spray as needed for sore throat. Continue to alternate between Tylenol and Ibuprofen for pain or fever. Use Mucinex/Robitussin/etc for cough suppression/expectoration of mucus. Use over the counter flonase and the netipot to help with nasal congestion. May consider over-the-counter Benadryl or other antihistamine like Claritin/Zyrtec/etc to decrease secretions and for help with your symptoms. Follow up with your primary care doctor in 5-7 days for recheck of ongoing symptoms. Return to emergency department for emergent changing or worsening of symptoms.

## 2018-11-27 NOTE — ED Triage Notes (Signed)
Pt states that she has generalized body aches x 3. Pt states flu like symptoms.

## 2018-12-03 ENCOUNTER — Other Ambulatory Visit: Payer: Self-pay | Admitting: Family Medicine

## 2018-12-08 ENCOUNTER — Other Ambulatory Visit: Payer: Self-pay | Admitting: Family Medicine

## 2018-12-08 NOTE — Telephone Encounter (Signed)
Please review pt's request

## 2018-12-23 ENCOUNTER — Ambulatory Visit: Payer: Self-pay | Admitting: Family Medicine

## 2018-12-23 ENCOUNTER — Emergency Department (HOSPITAL_COMMUNITY)
Admission: EM | Admit: 2018-12-23 | Discharge: 2018-12-23 | Disposition: A | Discharge status: Home / Self Care | Payer: Medicare Other | Attending: Emergency Medicine | Admitting: Emergency Medicine

## 2018-12-23 ENCOUNTER — Emergency Department (HOSPITAL_COMMUNITY): Payer: Medicare Other

## 2018-12-23 ENCOUNTER — Other Ambulatory Visit: Payer: Self-pay

## 2018-12-23 ENCOUNTER — Encounter (HOSPITAL_COMMUNITY): Payer: Self-pay

## 2018-12-23 DIAGNOSIS — R05 Cough: Secondary | ICD-10-CM | POA: Insufficient documentation

## 2018-12-23 DIAGNOSIS — Z794 Long term (current) use of insulin: Secondary | ICD-10-CM | POA: Diagnosis not present

## 2018-12-23 DIAGNOSIS — R059 Cough, unspecified: Secondary | ICD-10-CM

## 2018-12-23 DIAGNOSIS — Z79899 Other long term (current) drug therapy: Secondary | ICD-10-CM | POA: Insufficient documentation

## 2018-12-23 DIAGNOSIS — Z91040 Latex allergy status: Secondary | ICD-10-CM | POA: Diagnosis not present

## 2018-12-23 DIAGNOSIS — R9431 Abnormal electrocardiogram [ECG] [EKG]: Secondary | ICD-10-CM | POA: Diagnosis not present

## 2018-12-23 DIAGNOSIS — Z87891 Personal history of nicotine dependence: Secondary | ICD-10-CM | POA: Diagnosis not present

## 2018-12-23 DIAGNOSIS — R0981 Nasal congestion: Secondary | ICD-10-CM | POA: Diagnosis not present

## 2018-12-23 IMAGING — CR CHEST - 2 VIEW
2 series · 2 of 2 positions shown · non-contrast
Comparison: [DATE]

CLINICAL DATA: Dry cough for several days

EXAM:
CHEST - 2 VIEW

[w chest pa]
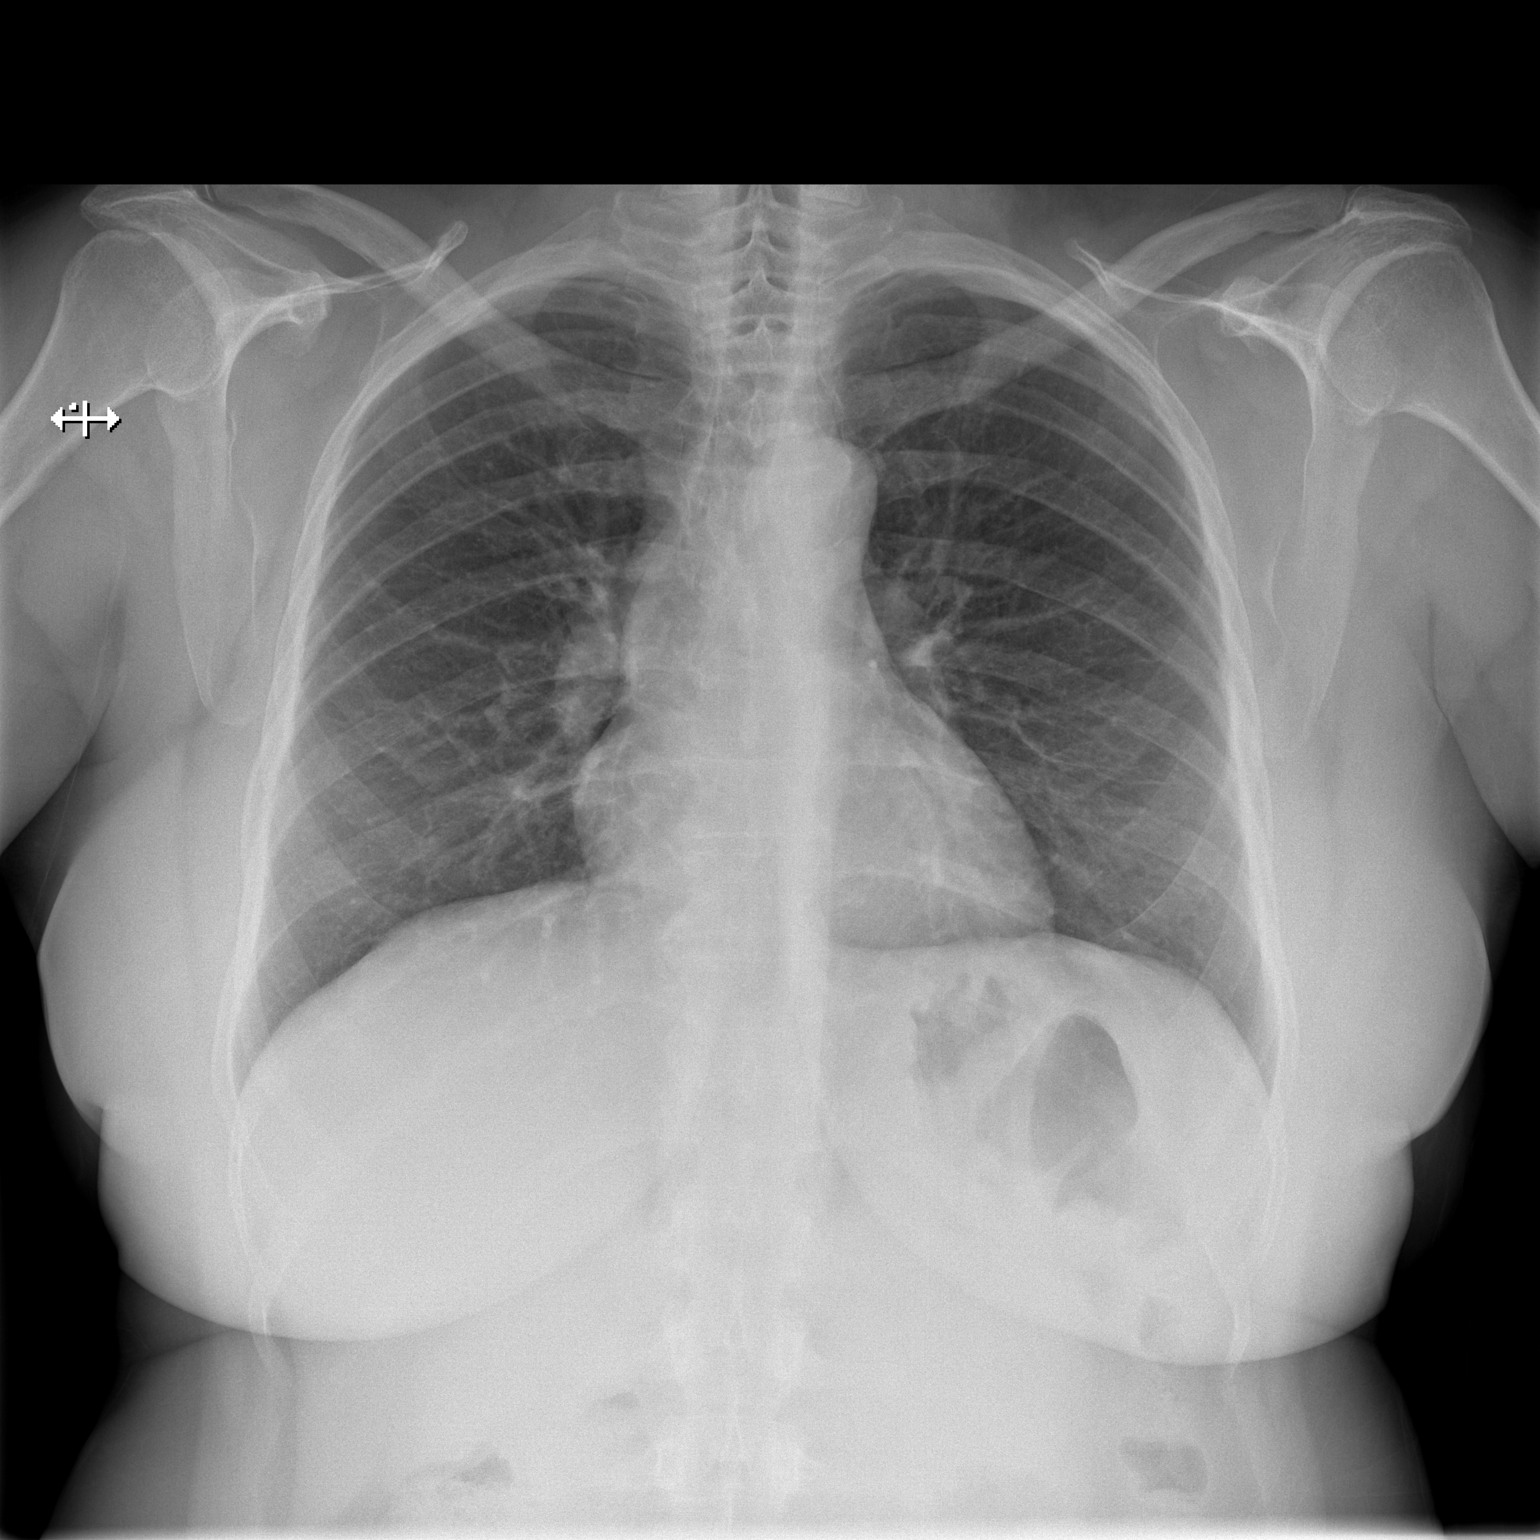

[w chest lat]
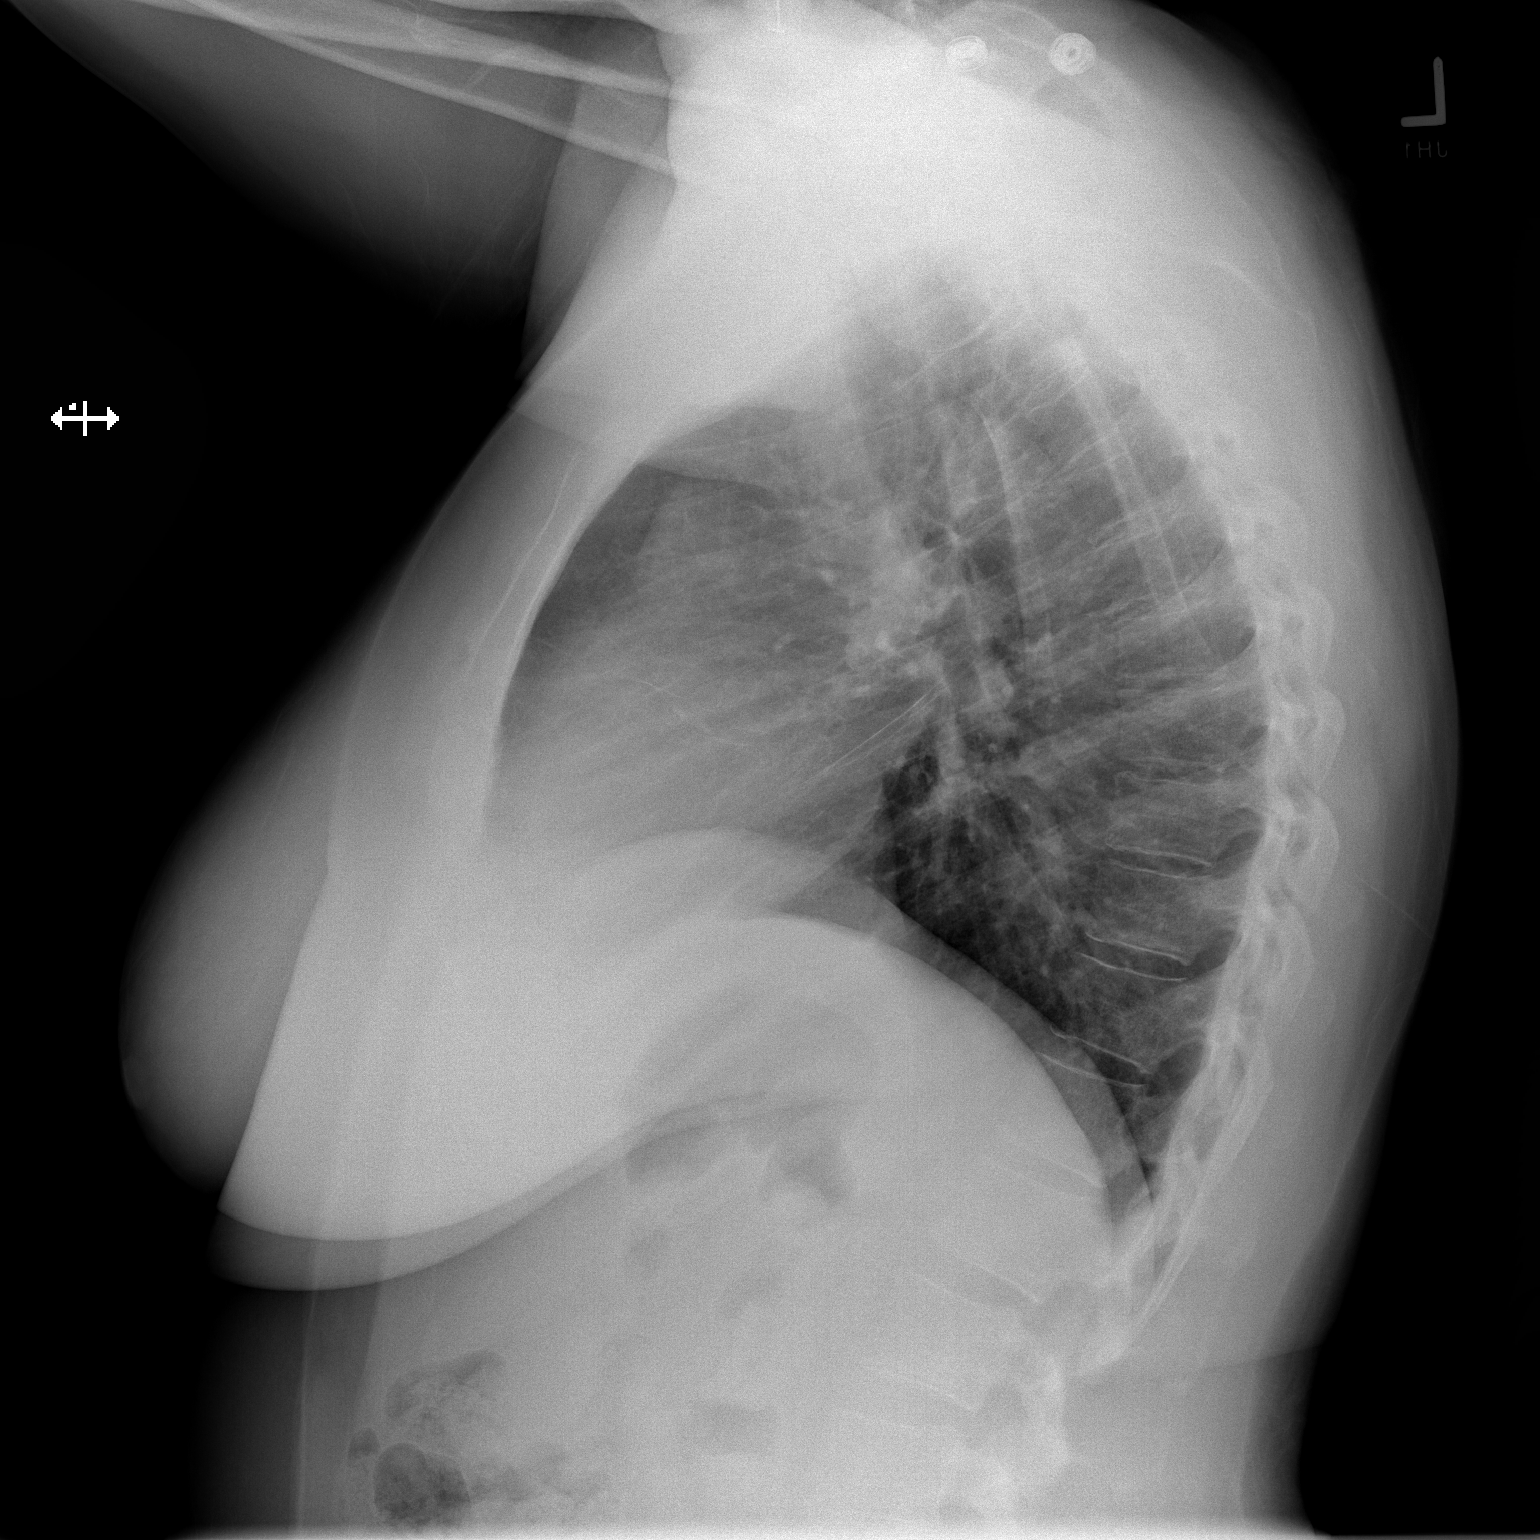

[2 of 2 positions shown; findings below may reference images not displayed]

FINDINGS: The heart size and mediastinal contours are within normal limits.
Both lungs are clear. The visualized skeletal structures are
unremarkable.
IMPRESSION: No active cardiopulmonary disease.

## 2018-12-23 MED ORDER — FLUTICASONE PROPIONATE 50 MCG/ACT NA SUSP: 2.0000 | Freq: Every day | NASAL | 0 refills | Status: DC

## 2018-12-23 NOTE — Discharge Instructions (Signed)
Your symptoms are likely caused by a viral upper respiratory infection versus allergies.  Antibiotics are not helpful in treating viral infection, the virus should run its course in about 5-7 days. Please make sure you are drinking plenty of fluids. You can treat your symptoms supportively with tylenol/ibuprofen for fevers and pains, Claritin and Flonase to help with nasal congestion, and over the counter cough syrups and throat lozenges to help with cough. If your symptoms are not improving please follow up with you Primary doctor.   If you develop persistent fevers, shortness of breath or difficulty breathing, chest pain, severe headache and neck pain, persistent nausea and vomiting or other new or concerning symptoms return to the Emergency department.

## 2018-12-23 NOTE — Telephone Encounter (Signed)
Pt. complaining of dry cough x 4 days. No fever or shortness of breath. Eras are itchy, sore throat. Taking Claritin for allergies. States "this cough is different from what I have had before." Warm transfer to [facility] in the practice for an appointment.  Answer Assessment - Initial Assessment Questions 1. ONSET: "When did the cough begin?"      4 days ago 2. SEVERITY: "How bad is the cough today?"      Mild 3. RESPIRATORY DISTRESS: "Describe your breathing."      No distress 4. FEVER: "Do you have a fever?" If so, ask: "What is your temperature, how was it measured, and when did it start?"     No 5. HEMOPTYSIS: "Are you coughing up any blood?" If so ask: "How much?" (flecks, streaks, tablespoons, etc.)     No 6. TREATMENT: "What have you done so far to treat the cough?" (e.g., meds, fluids, humidifier)     Claritin 7. CARDIAC HISTORY: "Do you have any history of heart disease?" (e.g., heart attack, congestive heart failure)      No 8. LUNG HISTORY: "Do you have any history of lung disease?"  (e.g., pulmonary embolus, asthma, emphysema)     No 9. PE RISK FACTORS: "Do you have a history of blood clots?" (or: recent major surgery, recent prolonged travel, bedridden)     No 10. OTHER SYMPTOMS: "Do you have any other symptoms? (e.g., runny nose, wheezing, chest pain)       Ears itchy, sore throat, burning in chest with cough 11. PREGNANCY: "Is there any chance you are pregnant?" "When was your last menstrual period?"       No 12. TRAVEL: "Have you traveled out of the country in the last month?" (e.g., travel history, exposures)       No  Protocols used: COUGH - ACUTE NON-PRODUCTIVE-A-AH

## 2018-12-23 NOTE — ED Triage Notes (Signed)
Pt complains of a dry cough for 4 days, no fevers, slight back ache, no vomiting or diarrhea

## 2018-12-23 NOTE — ED Notes (Signed)
Patient transported to X-ray 

## 2018-12-23 NOTE — ED Provider Notes (Signed)
[facility] DEPT Provider Note   CSN: [id number] Arrival date & time: 12/23/18  1956    History   Chief Complaint Chief Complaint  Patient presents with  . Cough    HPI Beverly Ramirez is a 58 y.o. female.     Beverly Ramirez is a 58 y.o. female with a history of ulcerative colitis, hyperlipidemia, anemia and DJD, who presents to the emergency department for evaluation of cough.  Patient reports for the past 4 days she has had an intermittent right, she denies sputum production.  She denies any associated fevers or chills.  She has been dealing with worsening allergies, her symptoms initially started with rhinorrhea, prior to cough developing.  She denies any shortness of breath.  Reports with repetitive coughing she has a burning discomfort over her chest that goes away when she stops coughing.  She denies any abdominal pain, nausea vomiting or diarrhea.  No anosmia or dysgeusia.  Her daughter is here with chest pain, cough and headache, her daughter has not been experiencing fevers either and neither has had any known sick contacts or recent travel.  She is a non-smoker.  She has been taking Claritin with some improvement in her rhinorrhea has not tried anything for cough, no other aggravating or alleviating factors.     Past Medical History:  Diagnosis Date  . Anemia   . Carpal tunnel syndrome    bilateral  . Chronic heel pain   . DJD (degenerative joint disease) of cervical spine    MRI 2017  . Hyperlipidemia   . Plantar fasciitis    bilateral  . Tendonitis    left hand/wrist  . UC (ulcerative colitis) [facility])     Patient Active Problem List   Diagnosis Date Noted  . De Quervain's tenosynovitis, left 11/16/2018  . Leukocytosis 10/04/2017  . Hyperlipidemia 08/04/2017  . Ulcerative colitis without complications ([place]) 30/16/0109  . Plantar fasciitis 05/28/2017    Past Surgical History:  Procedure Laterality Date  . ABDOMINAL  HYSTERECTOMY    . APPENDECTOMY    . CESAREAN SECTION     x4  . COLONOSCOPY     Multiple in [place]  . ESOPHAGOGASTRODUODENOSCOPY       OB History    Gravida  4   Para  4   Term      Preterm      AB      Living  4     SAB      TAB      Ectopic      Multiple      Live Births               Home Medications    Prior to Admission medications   Medication Sig Start Date End Date Taking? Authorizing Provider  amLODipine (NORVASC) 5 MG tablet Take 1 tablet (5 mg total) by mouth daily. 05/07/18   Rutherford Guys, MD  blood glucose meter kit and supplies KIT Based on patient's insurance. Check twice a day. Dx R73.9, Z79.52 09/24/18   Rutherford Guys, MD  cyclobenzaprine (FLEXERIL) 5 MG tablet Take 1-2 tablets (5-10 mg total) by mouth 3 (three) times daily as needed. 06/19/18   Wendie Agreste, MD  fluticasone (FLONASE) 50 MCG/ACT nasal spray Place 2 sprays into both nostrils daily. 12/23/18   Jacqlyn Larsen, PA-C  Insulin Glargine (LANTUS SOLOSTAR) 100 UNIT/ML Solostar Pen Inject 20 Units into the skin at bedtime. 11/11/18   Lamptey,  Myrene Galas, MD  insulin regular (NOVOLIN R) 100 units/mL injection Inject 0.08 mLs (8 Units total) into the skin 3 (three) times daily before meals. 10/04/18   Rutherford Guys, MD  Insulin Syringe-Needle U-100 (INSULIN SYRINGE 1CC/31GX5/16") 31G X 5/16" 1 ML MISC Inject 0.08 ( 8 Units)  into the skin 3 times daily before meals. 10/16/18   Rutherford Guys, MD  Lancets [facility] DELICA PLUS [id number]) MISC USE TO CHECK BLOOD SUGAR TWICE A DAY 11/19/18   Rutherford Guys, MD  mesalamine (LIALDA) 1.2 g EC tablet Take 2 tablets (2.4 g total) by mouth 2 (two) times daily. 11/25/18 11/20/19  Gatha Mayer, MD  [facility] VERIO test strip CHECK BLOOD SUGAR TWICE A DAY. 12/08/18   Rutherford Guys, MD  traMADol (ULTRAM) 50 MG tablet Take 1 tablet (50 mg total) by mouth every 8 (eight) hours as needed. 11/10/18   Rutherford Guys, MD  triamcinolone cream  (KENALOG) 0.1 % Apply 1 application topically 2 (two) times daily. 11/25/18   Gatha Mayer, MD    Family History Family History  Problem Relation Age of Onset  . Stroke Mother   . Diabetes Mother   . Hypertension Mother   . Lung cancer Mother   . Sickle cell trait Mother   . Breast cancer Maternal Aunt   . Diabetes Maternal Aunt   . Lung cancer Maternal Aunt   . Lung cancer Maternal Aunt   . Lung cancer Maternal Aunt   . Colon cancer Cousin 93  . Sickle cell trait Father   . Sickle cell anemia Sister 87  . Lung cancer Maternal Grandmother   . Sickle cell anemia Sister   . Other Sister        accident and blood clot formed    Social History Social History   Tobacco Use  . Smoking status: Former Smoker    Types: Cigarettes    Last attempt to quit: 10/13/2006    Years since quitting: 12.2  . Smokeless tobacco: Never Used  Substance Use Topics  . Alcohol use: Yes    Comment: occasional  . Drug use: No     Allergies   Codeine and Latex   Review of Systems Review of Systems  Constitutional: Negative for chills and fever.  HENT: Positive for congestion, postnasal drip and rhinorrhea. Negative for ear pain and sore throat.   Respiratory: Positive for cough. Negative for chest tightness, shortness of breath and wheezing.   Cardiovascular: Negative for chest pain.  Gastrointestinal: Negative for abdominal pain, nausea and vomiting.  Musculoskeletal: Negative for arthralgias and myalgias.  Skin: Negative for color change and rash.  All other systems reviewed and are negative.    Physical Exam Updated Vital Signs BP 133/87   Pulse 84   Temp 99.2 F (37.3 C) (Oral)   Resp 18   Ht 5' 2"  (1.575 m)   Wt 74.8 kg   SpO2 99%   BMI 30.18 kg/m   Physical Exam Vitals signs and nursing note reviewed.  Constitutional:      General: She is not in acute distress.    Appearance: Normal appearance. She is well-developed and normal weight. She is not ill-appearing,  toxic-appearing or diaphoretic.  HENT:     Head: Normocephalic and atraumatic.     Right Ear: Tympanic membrane and ear canal normal.     Left Ear: Ear canal normal.     Nose: Congestion and rhinorrhea present.     Comments:  Bilateral nares patent with moderate mucosal edema and clear rhinorrhea present.     Mouth/Throat:     Mouth: Mucous membranes are moist.     Pharynx: Oropharynx is clear. No oropharyngeal exudate.     Comments: Posterior oropharynx clear and mucous membranes moist, small amount of post-nasal drainage noted, there is mild erythema but no edema or tonsillar exudates, uvula midline, normal phonation, no trismus, tolerating secretions without difficulty. Eyes:     General:        Right eye: No discharge.        Left eye: No discharge.  Neck:     Musculoskeletal: Neck supple.     Comments: No rigidity Cardiovascular:     Rate and Rhythm: Normal rate and regular rhythm.     Heart sounds: Normal heart sounds. No murmur. No friction rub. No gallop.   Pulmonary:     Effort: Pulmonary effort is normal. No respiratory distress.     Breath sounds: Normal breath sounds.     Comments: Respirations equal and unlabored, patient able to speak in full sentences, lungs clear to auscultation bilaterally Chest:     Chest wall: No tenderness.  Abdominal:     General: Bowel sounds are normal. There is no distension.     Palpations: Abdomen is soft. There is no mass.     Tenderness: There is no abdominal tenderness. There is no guarding.     Comments: Abdomen soft, nondistended, nontender to palpation in all quadrants without guarding or peritoneal signs  Musculoskeletal:        General: No deformity.  Lymphadenopathy:     Cervical: No cervical adenopathy.  Skin:    General: Skin is warm and dry.     Capillary Refill: Capillary refill takes less than 2 seconds.  Neurological:     Mental Status: She is alert and oriented to person, place, and time.     Coordination: Coordination  normal.  Psychiatric:        Mood and Affect: Mood normal.        Behavior: Behavior normal.      ED Treatments / Results  Labs (all labs ordered are listed, but only abnormal results are displayed) Labs Reviewed - No data to display  EKG EKG Interpretation  Date/Time:  Thursday December 23 2018 20:47:07 EDT Ventricular Rate:  75 PR Interval:    QRS Duration: 78 QT Interval:  374 QTC Calculation: 418 R Axis:   2 Text Interpretation:  Sinus rhythm Posterior infarct, old Borderline T abnormalities, inferior leads Baseline wander in lead(s) III aVF V2 No old tracing to compare Confirmed by Daleen Bo [phone]) on 12/23/2018 9:01:38 PM   Radiology Dg Chest 2 View  Result Date: 12/23/2018 CLINICAL DATA:  Dry cough for several days EXAM: CHEST - 2 VIEW COMPARISON:  10/06/2017 FINDINGS: The heart size and mediastinal contours are within normal limits. Both lungs are clear. The visualized skeletal structures are unremarkable. IMPRESSION: No active cardiopulmonary disease. Electronically Signed   By: Inez Catalina M.D.   On: 12/23/2018 21:09    Procedures Procedures (including critical care time)  Medications Ordered in ED Medications - No data to display   Initial Impression / Assessment and Plan / ED Course  I have reviewed the triage vital signs and the nursing notes.  Pertinent labs & imaging results that were available during my care of the patient were reviewed by me and considered in my medical decision making (see chart for details).  Patient presents  with 4 days of dry cough, prior to this patient was dealing with some nasal congestion which is been improving somewhat with Claritin.  She has not had any associated fevers.  She reports that with cough she gets an intermittent burning chest pain that resolves when coughing stop she denies associated shortness of breath, wheezing or chest tightness. Her daughter is here with chest pain and headache but has not had any fevers  either.  Neither of them have had any recent sick contacts or travel.  I suspect patient's dry cough is more related to her allergies, she has some noted postnasal drip on exam, and symptoms have been somewhat improved with Claritin.  She is well-appearing with clear lungs, her chest x-ray shows no evidence of pneumonia or other active cardiopulmonary disease, images reviewed by myself.  EKG shows sinus rhythm without concerning ischemic changes.  Chest pain is only present during cough and I have low suspicion for cardiac etiology.  Will treat with Flonase and have patient continue Claritin have also encouraged her to use an over-the-counter cough medication if cough persists.  Though patient is afebrile and without other lower respiratory symptoms, given current pandemic recommended that she quarantine at home until cough improves and monitor her symptoms for any worsening.  She expresses understanding with plan.  Discharged home in good condition.  Final Clinical Impressions(s) / ED Diagnoses   Final diagnoses:  Cough    ED Discharge Orders         Ordered    fluticasone (FLONASE) 50 MCG/ACT nasal spray  Daily     12/23/18 2119           Jacqlyn Larsen, [place] 12/25/18 1001    Daleen Bo, MD 12/26/18 1032

## 2018-12-24 NOTE — Telephone Encounter (Signed)
Noted. Has tele-med visit on 12/27/18 with Dr. Carlota Raspberry

## 2018-12-26 ENCOUNTER — Other Ambulatory Visit: Payer: Self-pay | Admitting: Family Medicine

## 2018-12-27 ENCOUNTER — Telehealth (INDEPENDENT_AMBULATORY_CARE_PROVIDER_SITE_OTHER): Payer: Medicare Other | Admitting: Family Medicine

## 2018-12-27 ENCOUNTER — Telehealth: Payer: Self-pay | Admitting: Family Medicine

## 2018-12-27 ENCOUNTER — Other Ambulatory Visit: Payer: Self-pay

## 2018-12-27 DIAGNOSIS — R05 Cough: Secondary | ICD-10-CM

## 2018-12-27 DIAGNOSIS — R059 Cough, unspecified: Secondary | ICD-10-CM

## 2018-12-27 DIAGNOSIS — J309 Allergic rhinitis, unspecified: Secondary | ICD-10-CM

## 2018-12-27 MED ORDER — DOXYCYCLINE HYCLATE 100 MG PO TABS: 100.0000 mg | ORAL_TABLET | Freq: Two times a day (BID) | ORAL | 0 refills | Status: DC

## 2018-12-27 NOTE — Telephone Encounter (Signed)
12/27/2018 - PATIENT HAD A VIRTUAL VISIT WITH DR. Carlota Raspberry ON Monday 12/27/2018. HE WANTS PATIENT TO GET SIGNED UP WITH THE MY-CHART PROGRAM. I HAD TO LEAVE HER A VOICE MAIL THAT I SENT THE MY-CHART LINK TO HER E-MAIL ADDRESS. I ALSO GAVE HER THE PHONE NUMBER TO THE [facility] MY-CHART TECHNICAL SUPPORT TEAM IF SHE SHOULD HAVE ANY PROBLEMS. [place]

## 2018-12-27 NOTE — Progress Notes (Signed)
Virtual Visit via phone Note  I connected with Beverly Ramirez on 12/27/18 at 12:27 PM by phone and verified that I am speaking with the correct person using two identifiers.   I discussed the limitations, risks, security and privacy concerns of performing an evaluation and management service by telephone and the availability of in person appointments. I also discussed with the patient that there may be a patient responsible charge related to this service. The patient expressed understanding and agreed to proceed, consent obtained  6 min chart review from ER note.   Chief complaint:  Cough   History of Present Illness: Beverly Ramirez is a 58 y.o. female  Cough: Seen 4 days ago in the ER, 4 days of cough at that time worsening allergies.  No dyspnea.  No fevers.  Burning discomfort in chest with cough that improved after cough stopped.  EKG without apparent acute findings.  Chest x-ray without active cardiopulmonary disease.  Cough thought to be due primarily due to allergies at that time, but sick contact with daughter.  Although Beverly Ramirez was afebrile and without other lower respiratory symptoms Beverly Ramirez was recommended to quarantine at home until cough improves and monitored for other symptomatic worsening.  Primarily dry cough, with burning sensation in chest with cough.itchy feeling.  Few weeks prior had some cough as well - thought to be due to allergies. claritin helped some last month, but cough came back.   Persistent cough, now with green/yellow mucus - past 6 days,  nasal d/c and phlegm at times. No face pain.  No fever.  Rare HA, not constant, occasional leg aches, but no diffuse body aches. No dyspnea.  Has been taking Flonase nasal spray past 3 days, and has remained on claritin.  No wheeze, no hx of asthma.  Denies recent heartburn.   Patient Active Problem List   Diagnosis Date Noted  . De Quervain's tenosynovitis, left 11/16/2018  . Leukocytosis 10/04/2017  .  Hyperlipidemia 08/04/2017  . Ulcerative colitis without complications ([place]) 35/32/9924  . Plantar fasciitis 05/28/2017   Past Medical History:  Diagnosis Date  . Anemia   . Carpal tunnel syndrome    bilateral  . Chronic heel pain   . DJD (degenerative joint disease) of cervical spine    MRI 2017  . Hyperlipidemia   . Plantar fasciitis    bilateral  . Tendonitis    left hand/wrist  . UC (ulcerative colitis) ([place])    Past Surgical History:  Procedure Laterality Date  . ABDOMINAL HYSTERECTOMY    . APPENDECTOMY    . CESAREAN SECTION     x4  . COLONOSCOPY     Multiple in [place]  . ESOPHAGOGASTRODUODENOSCOPY     Allergies  Allergen Reactions  . Codeine Itching, Rash and Hives  . Latex Hives    With the power   Prior to Admission medications   Medication Sig Start Date End Date Taking? Authorizing Provider  amLODipine (NORVASC) 5 MG tablet Take 1 tablet (5 mg total) by mouth daily. 05/07/18  Yes Rutherford Guys, MD  blood glucose meter kit and supplies KIT Based on patient's insurance. Check twice a day. Dx R73.9, Z79.52 09/24/18  Yes Rutherford Guys, MD  fluticasone [facility]) 50 MCG/ACT nasal spray Place 2 sprays into both nostrils daily. 12/23/18  Yes Jacqlyn Larsen, PA-C  Insulin Glargine (LANTUS SOLOSTAR) 100 UNIT/ML Solostar Pen Inject 20 Units into the skin at bedtime. 11/11/18  Yes Lamptey, Myrene Galas, MD  Insulin Syringe-Needle U-100 (  INSULIN SYRINGE 1CC/31GX5/16") 31G X 5/16" 1 ML MISC Inject 0.08 ( 8 Units)  into the skin 3 times daily before meals. 10/16/18  Yes Rutherford Guys, MD  Lancets [facility] DELICA PLUS [id number]) MISC USE TO CHECK BLOOD SUGAR TWICE A DAY 11/19/18  Yes Rutherford Guys, MD  mesalamine (LIALDA) 1.2 g EC tablet Take 2 tablets (2.4 g total) by mouth 2 (two) times daily. 11/25/18 11/20/19 Yes Gatha Mayer, MD  NOVOLIN R 100 UNIT/ML injection INJECT 8 UNITS INTO THE SKIN 3 TIMES DAILY BEFORE MEALS. 12/26/18  Yes Rutherford Guys, MD  [facility] VERIO  test strip CHECK BLOOD SUGAR TWICE A DAY. 12/08/18  Yes Rutherford Guys, MD  traMADol (ULTRAM) 50 MG tablet Take 1 tablet (50 mg total) by mouth every 8 (eight) hours as needed. Patient not taking: Reported on 12/27/2018 11/10/18   Rutherford Guys, MD   Social History   Socioeconomic History  . Marital status: Married    Spouse name: Simona Huh  . Number of children: 4  . Years of education: Not on file  . Highest education level: Not on file  Occupational History  . Occupation: retired    Comment: He formerly worked for [employer], disabled due to a fall  Social Needs  . Financial resource strain: Not on file  . Food insecurity:    Worry: Not on file    Inability: Not on file  . Transportation needs:    Medical: Not on file    Non-medical: Not on file  Tobacco Use  . Smoking status: Former Smoker    Types: Cigarettes    Last attempt to quit: 10/13/2006    Years since quitting: 12.2  . Smokeless tobacco: Never Used  Substance and Sexual Activity  . Alcohol use: Yes    Comment: occasional  . Drug use: No  . Sexual activity: Yes    Partners: Male    Comment: 1ST intercourse- 39, partners- 75, married- 16 yrs   Lifestyle  . Physical activity:    Days per week: Not on file    Minutes per session: Not on file  . Stress: Not on file  Relationships  . Social connections:    Talks on phone: Not on file    Gets together: Not on file    Attends religious service: Not on file    Active member of club or organization: Not on file    Attends meetings of clubs or organizations: Not on file    Relationship status: Not on file  . Intimate partner violence:    Fear of current or ex partner: Not on file    Emotionally abused: Not on file    Physically abused: Not on file    Forced sexual activity: Not on file  Other Topics Concern  . Not on file  Social History Narrative   Beverly Ramirez is married, 3 sons born 1979, 1981, 1984.  One daughter born in 10.   Beverly Ramirez is  medically retired from the [employer] after a fall and a hip injury, around 2004.   Caffeinated beverage every other day x1   07/13/2017    Observations/Objective:  No distress on phone. Speaking in full sentences with appropriate responses, no respiratory distress.   Assessment and Plan: Cough - Plan: doxycycline (VIBRA-TABS) 100 MG tablet  Allergic rhinitis, unspecified seasonality, unspecified trigger  likely prior allergies, with peristent cough, now secondary worsening with discolored productive phlegm   -  continue flonase, claritin  - trial of pepcid for possible LPR/heartburn component.   - start doxycycline, potential side effects and risks discussed.  rtc /er precautions.   Follow Up Instructions: As needed.   Patient Instructions   Continue flonase and claritin.  Ok to try pepcid for now in case heartburn may be contributing.  doyxcycline antibiotic twice per day.  If not improving in next week, or any worsening sooner, I recommend repeat exam.   Return to the clinic or go to the nearest emergency room if any of your symptoms worsen or new symptoms occur.   Cough, Adult  Coughing is a reflex that clears your throat and your airways. Coughing helps to heal and protect your lungs. It is normal to cough occasionally, but a cough that happens with other symptoms or lasts a long time may be a sign of a condition that needs treatment. A cough may last only 2-3 weeks (acute), or it may last longer than 8 weeks (chronic). What are the causes? Coughing is commonly caused by:  Breathing in substances that irritate your lungs.  A viral or bacterial respiratory infection.  Allergies.  Asthma.  Postnasal drip.  Smoking.  Acid backing up from the stomach into the esophagus (gastroesophageal reflux).  Certain medicines.  Chronic lung problems, including COPD (or rarely, lung cancer).  Other medical conditions such as heart failure. Follow these  instructions at home: Pay attention to any changes in your symptoms. Take these actions to help with your discomfort:  Take medicines only as told by your health care provider. ? If you were prescribed an antibiotic medicine, take it as told by your health care provider. Do not stop taking the antibiotic even if you start to feel better. ? Talk with your health care provider before you take a cough suppressant medicine.  Drink enough fluid to keep your urine clear or pale yellow.  If the air is dry, use a cold steam vaporizer or humidifier in your bedroom or your home to help loosen secretions.  Avoid anything that causes you to cough at work or at home.  If your cough is worse at night, try sleeping in a semi-upright position.  Avoid cigarette smoke. If you smoke, quit smoking. If you need help quitting, ask your health care provider.  Avoid caffeine.  Avoid alcohol.  Rest as needed. Contact a health care provider if:  You have new symptoms.  You cough up pus.  Your cough does not get better after 2-3 weeks, or your cough gets worse.  You cannot control your cough with suppressant medicines and you are losing sleep.  You develop pain that is getting worse or pain that is not controlled with pain medicines.  You have a fever.  You have unexplained weight loss.  You have night sweats. Get help right away if:  You cough up blood.  You have difficulty breathing.  Your heartbeat is very fast. This information is not intended to replace advice given to you by your health care provider. Make sure you discuss any questions you have with your health care provider. Document Released: 02/21/2011 Document Revised: 01/31/2016 Document Reviewed: 11/01/2014 Elsevier Interactive Patient Education  [employer].   If you have lab work done today you will be contacted with your lab results within the next 2 weeks.  If you have not heard from [place] then please contact us. The  fastest way to get your results is to register for My Chart.  IF you received an x-ray today, you will receive an invoice from [facility]. Please contact [facility] at [phone] with questions or concerns regarding your invoice.   IF you received labwork today, you will receive an invoice from [facility]. Please contact [facility] at [phone] with questions or concerns regarding your invoice.   Our billing staff will not be able to assist you with questions regarding bills from these companies.  You will be contacted with the lab results as soon as they are available. The fastest way to get your results is to activate your My Chart account. Instructions are located on the last page of this paperwork. If you have not heard from [place] regarding the results in 2 weeks, please contact this office.          I discussed the assessment and treatment plan with the patient. The patient was provided an opportunity to ask questions and all were answered. The patient agreed with the plan and demonstrated an understanding of the instructions.   The patient was advised to call back or seek an in-person evaluation if the symptoms worsen or if the condition fails to improve as anticipated.  I provided 18 minutes of non-face-to-face time during this encounter.   Wendie Agreste, MD

## 2018-12-27 NOTE — Progress Notes (Signed)
CC- cough- Patient was seen in the ED on 12/23/18 cause she could not get into see [place]. They done a chest xray and they stated her lungs was clear. Ekg was fine. They let me go and gave me Flonase nasal spray and sent me home. The nasal spray has been helping with the congestion but has not done any thing for the cough and green mucus. Not feeling any better at all. Feel like I need something else to help clear this up. When cough have a burning/itch in chest, Have been also taking Claritin as informed.

## 2018-12-27 NOTE — Patient Instructions (Addendum)
Continue flonase and claritin.  Ok to try pepcid for now in case heartburn may be contributing.  doyxcycline antibiotic twice per day.  If not improving in next week, or any worsening sooner, I recommend repeat exam.   Return to the clinic or go to the nearest emergency room if any of your symptoms worsen or new symptoms occur.   Cough, Adult  Coughing is a reflex that clears your throat and your airways. Coughing helps to heal and protect your lungs. It is normal to cough occasionally, but a cough that happens with other symptoms or lasts a long time may be a sign of a condition that needs treatment. A cough may last only 2-3 weeks (acute), or it may last longer than 8 weeks (chronic). What are the causes? Coughing is commonly caused by:  Breathing in substances that irritate your lungs.  A viral or bacterial respiratory infection.  Allergies.  Asthma.  Postnasal drip.  Smoking.  Acid backing up from the stomach into the esophagus (gastroesophageal reflux).  Certain medicines.  Chronic lung problems, including COPD (or rarely, lung cancer).  Other medical conditions such as heart failure. Follow these instructions at home: Pay attention to any changes in your symptoms. Take these actions to help with your discomfort:  Take medicines only as told by your health care provider. ? If you were prescribed an antibiotic medicine, take it as told by your health care provider. Do not stop taking the antibiotic even if you start to feel better. ? Talk with your health care provider before you take a cough suppressant medicine.  Drink enough fluid to keep your urine clear or pale yellow.  If the air is dry, use a cold steam vaporizer or humidifier in your bedroom or your home to help loosen secretions.  Avoid anything that causes you to cough at work or at home.  If your cough is worse at night, try sleeping in a semi-upright position.  Avoid cigarette smoke. If you smoke, quit  smoking. If you need help quitting, ask your health care provider.  Avoid caffeine.  Avoid alcohol.  Rest as needed. Contact a health care provider if:  You have new symptoms.  You cough up pus.  Your cough does not get better after 2-3 weeks, or your cough gets worse.  You cannot control your cough with suppressant medicines and you are losing sleep.  You develop pain that is getting worse or pain that is not controlled with pain medicines.  You have a fever.  You have unexplained weight loss.  You have night sweats. Get help right away if:  You cough up blood.  You have difficulty breathing.  Your heartbeat is very fast. This information is not intended to replace advice given to you by your health care provider. Make sure you discuss any questions you have with your health care provider. Document Released: 02/21/2011 Document Revised: 01/31/2016 Document Reviewed: 11/01/2014 Elsevier Interactive Patient Education  [employer].   If you have lab work done today you will be contacted with your lab results within the next 2 weeks.  If you have not heard from [place] then please contact us. The fastest way to get your results is to register for My Chart.   IF you received an x-ray today, you will receive an invoice from [facility]. Please contact [facility] at [phone] with questions or concerns regarding your invoice.   IF you received labwork today, you will receive an invoice from [facility] [employer] [facility].  Please contact [facility] at [phone] with questions or concerns regarding your invoice.   Our billing staff will not be able to assist you with questions regarding bills from these companies.  You will be contacted with the lab results as soon as they are available. The fastest way to get your results is to activate your My Chart account. Instructions are located on the last page of this paperwork. If you have not heard from [place] regarding the results in 2  weeks, please contact this office.

## 2018-12-27 NOTE — Telephone Encounter (Signed)
Patient returned call and information mentioned below was given.

## 2019-01-06 ENCOUNTER — Other Ambulatory Visit: Payer: Self-pay

## 2019-01-06 ENCOUNTER — Telehealth (INDEPENDENT_AMBULATORY_CARE_PROVIDER_SITE_OTHER): Payer: Medicare Other | Admitting: Family Medicine

## 2019-01-06 DIAGNOSIS — R1011 Right upper quadrant pain: Secondary | ICD-10-CM

## 2019-01-06 DIAGNOSIS — R109 Unspecified abdominal pain: Secondary | ICD-10-CM

## 2019-01-06 DIAGNOSIS — M791 Myalgia, unspecified site: Secondary | ICD-10-CM | POA: Diagnosis not present

## 2019-01-06 NOTE — Progress Notes (Signed)
Pt is having pain on the right side of the body, along with headaches. Says when she walks and put pressure down on the right foot, the pain radiates up the back of the leg. Having pain in the right kidney area and stomach. Says the pain feels like really bad charlie horses. Denies falling, but does have light nausea and some vertigo. Taking tramodol for the pain. Onset 1 wk ago.

## 2019-01-06 NOTE — Progress Notes (Signed)
Virtual Visit Note  I connected with patient on 01/06/19 at 1057am by phone and verified that I am speaking with the correct person using two identifiers. Beverly Ramirez is currently located at home and patient is currently with them during visit. The provider, Rutherford Guys, MD is located in their office at time of visit.  I discussed the limitations, risks, security and privacy concerns of performing an evaluation and management service by telephone and the availability of in person appointments. I also discussed with the patient that there may be a patient responsible charge related to this service. The patient expressed understanding and agreed to proceed.   CC: right sided pain  HPI ? Patient is a 58 y.o. female with past medical history significant for ulcerative colitis, HLP, hyperglycemia who presents today for  Right sided back pain  Having whole body aches, headaches, tired and weak x 1 week Recently treated for LTRI with azithryomycin, cough and other URI sx have resolved Neck muscle cramps, very debilitating, recurring Worse pain right side abdomen radiating to right flank x 3 days abd pain is not better or worse with eating Vomited x 1 when she took abx, no nausea or vomiting since then Had diarrhea for 2-3 days with abx and has since then normalized Pain is worse with walking, worse on the right, now shooting pain Denies any swollen joints No fevers or rash Urine is yellow color, denies any dysuria or hematuria Drinking lots of water Has not been taking tramadol for pain   Allergies  Allergen Reactions  . Codeine Itching, Rash and Hives  . Latex Hives    With the power    Prior to Admission medications   Medication Sig Start Date End Date Taking? Authorizing Provider  amLODipine (NORVASC) 5 MG tablet Take 1 tablet (5 mg total) by mouth daily. 05/07/18   Rutherford Guys, MD  blood glucose meter kit and supplies KIT Based on patient's insurance. Check  twice a day. Dx R73.9, Z79.52 09/24/18   Rutherford Guys, MD  doxycycline (VIBRA-TABS) 100 MG tablet Take 1 tablet (100 mg total) by mouth 2 (two) times daily. 12/27/18   Wendie Agreste, MD  fluticasone (FLONASE) 50 MCG/ACT nasal spray Place 2 sprays into both nostrils daily. 12/23/18   Jacqlyn Larsen, PA-C  Insulin Glargine (LANTUS SOLOSTAR) 100 UNIT/ML Solostar Pen Inject 20 Units into the skin at bedtime. 11/11/18   LampteyMyrene Galas, MD  Insulin Syringe-Needle U-100 (INSULIN SYRINGE 1CC/31GX5/16") 31G X 5/16" 1 ML MISC Inject 0.08 ( 8 Units)  into the skin 3 times daily before meals. 10/16/18   Rutherford Guys, MD  Lancets [facility] DELICA PLUS [id number]) MISC USE TO CHECK BLOOD SUGAR TWICE A DAY 11/19/18   Rutherford Guys, MD  mesalamine (LIALDA) 1.2 g EC tablet Take 2 tablets (2.4 g total) by mouth 2 (two) times daily. 11/25/18 11/20/19  Gatha Mayer, MD  NOVOLIN R 100 UNIT/ML injection INJECT 8 UNITS INTO THE SKIN 3 TIMES DAILY BEFORE MEALS. 12/26/18   Rutherford Guys, MD  [facility] VERIO test strip CHECK BLOOD SUGAR TWICE A DAY. 12/08/18   Rutherford Guys, MD  traMADol Veatrice Bourbon) 50 MG tablet Take 1 tablet (50 mg total) by mouth every 8 (eight) hours as needed. Patient not taking: Reported on 12/27/2018 11/10/18   Rutherford Guys, MD    Past Medical History:  Diagnosis Date  . Anemia   . Carpal tunnel syndrome    bilateral  .  Chronic heel pain   . DJD (degenerative joint disease) of cervical spine    MRI 2017  . Hyperlipidemia   . Plantar fasciitis    bilateral  . Tendonitis    left hand/wrist  . UC (ulcerative colitis) ([place])     Past Surgical History:  Procedure Laterality Date  . ABDOMINAL HYSTERECTOMY    . APPENDECTOMY    . CESAREAN SECTION     x4  . COLONOSCOPY     Multiple in [place]  . ESOPHAGOGASTRODUODENOSCOPY      Social History   Tobacco Use  . Smoking status: Former Smoker    Types: Cigarettes    Last attempt to quit: 10/13/2006    Years since quitting: 12.2   . Smokeless tobacco: Never Used  Substance Use Topics  . Alcohol use: Yes    Comment: occasional    Family History  Problem Relation Age of Onset  . Stroke Mother   . Diabetes Mother   . Hypertension Mother   . Lung cancer Mother   . Sickle cell trait Mother   . Breast cancer Maternal Aunt   . Diabetes Maternal Aunt   . Lung cancer Maternal Aunt   . Lung cancer Maternal Aunt   . Lung cancer Maternal Aunt   . Colon cancer Cousin 61  . Sickle cell trait Father   . Sickle cell anemia Sister 30  . Lung cancer Maternal Grandmother   . Sickle cell anemia Sister   . Other Sister        accident and blood clot formed    ROS Per hpi  Objective  Vitals as reported by the patient: none   ASSESSMENT and PLAN  1. Myalgia - CK; Future  2. Acute right flank pain - Urinalysis, Routine w reflex microscopic; Future  3. RUQ pain - CBC with Differential/Platelet; Future - Comprehensive metabolic panel; Future  Favoring post viral myositis. Labs to r/o GB and renal pathology.  Discussed supportive measures and RTC precautions.   FOLLOW-UP: 1 week   The above assessment and management plan was discussed with the patient. The patient verbalized understanding of and has agreed to the management plan. Patient is aware to call the clinic if symptoms persist or worsen. Patient is aware when to return to the clinic for a follow-up visit. Patient educated on when it is appropriate to go to the emergency department.    I provided 19 minutes of non-face-to-face time during this encounter.  Rutherford Guys, MD [facility] at [facility] [place], [place] [address] Ph.  [phone] Fax [phone]

## 2019-01-07 ENCOUNTER — Ambulatory Visit (INDEPENDENT_AMBULATORY_CARE_PROVIDER_SITE_OTHER): Payer: Medicare Other | Admitting: Family Medicine

## 2019-01-07 ENCOUNTER — Other Ambulatory Visit: Payer: Self-pay

## 2019-01-07 DIAGNOSIS — M791 Myalgia, unspecified site: Secondary | ICD-10-CM | POA: Diagnosis not present

## 2019-01-07 DIAGNOSIS — R1011 Right upper quadrant pain: Secondary | ICD-10-CM

## 2019-01-07 DIAGNOSIS — R109 Unspecified abdominal pain: Secondary | ICD-10-CM | POA: Diagnosis not present

## 2019-01-07 LAB — URINALYSIS, ROUTINE W REFLEX MICROSCOPIC
Bilirubin, UA: NEGATIVE
Leukocytes,UA: NEGATIVE
Nitrite, UA: NEGATIVE
RBC, UA: NEGATIVE
Specific Gravity, UA: 1.026 (ref 1.005–1.030)
Urobilinogen, Ur: 0.2 mg/dL (ref 0.2–1.0)
pH, UA: 5.5 (ref 5.0–7.5)

## 2019-01-08 LAB — CBC WITH DIFFERENTIAL/PLATELET
Basophils Absolute: 0.1 10*3/uL (ref 0.0–0.2)
Basos: 1 %
EOS (ABSOLUTE): 0.6 10*3/uL — ABNORMAL HIGH (ref 0.0–0.4)
Eos: 6 %
Hematocrit: 44.1 % (ref 34.0–46.6)
Hemoglobin: 14.5 g/dL (ref 11.1–15.9)
Immature Grans (Abs): 0.1 10*3/uL (ref 0.0–0.1)
Immature Granulocytes: 1 %
Lymphocytes Absolute: 3.5 10*3/uL — ABNORMAL HIGH (ref 0.7–3.1)
Lymphs: 34 %
MCH: 28.4 pg (ref 26.6–33.0)
MCHC: 32.9 g/dL (ref 31.5–35.7)
MCV: 86 fL (ref 79–97)
Monocytes Absolute: 0.9 10*3/uL (ref 0.1–0.9)
Monocytes: 9 %
Neutrophils Absolute: 5.2 10*3/uL (ref 1.4–7.0)
Neutrophils: 49 %
Platelets: 401 10*3/uL (ref 150–450)
RBC: 5.11 x10E6/uL (ref 3.77–5.28)
RDW: 11.8 % (ref 11.7–15.4)
WBC: 10.3 10*3/uL (ref 3.4–10.8)

## 2019-01-08 LAB — COMPREHENSIVE METABOLIC PANEL
ALT: 29 IU/L (ref 0–32)
AST: 27 IU/L (ref 0–40)
Albumin/Globulin Ratio: 1.4 (ref 1.2–2.2)
Albumin: 4.2 g/dL (ref 3.8–4.9)
Alkaline Phosphatase: 136 IU/L — ABNORMAL HIGH (ref 39–117)
BUN/Creatinine Ratio: 11 (ref 9–23)
BUN: 11 mg/dL (ref 6–24)
Bilirubin Total: 0.7 mg/dL (ref 0.0–1.2)
CO2: 21 mmol/L (ref 20–29)
Calcium: 10.4 mg/dL — ABNORMAL HIGH (ref 8.7–10.2)
Chloride: 95 mmol/L — ABNORMAL LOW (ref 96–106)
Creatinine, Ser: 1.02 mg/dL — ABNORMAL HIGH (ref 0.57–1.00)
GFR calc Af Amer: 71 mL/min/{1.73_m2} (ref >59)
GFR calc non Af Amer: 62 mL/min/{1.73_m2} (ref >59)
Globulin, Total: 3.1 g/dL (ref 1.5–4.5)
Glucose: 413 mg/dL — ABNORMAL HIGH (ref 65–99)
Potassium: 5.3 mmol/L — ABNORMAL HIGH (ref 3.5–5.2)
Sodium: 132 mmol/L — ABNORMAL LOW (ref 134–144)
Total Protein: 7.3 g/dL (ref 6.0–8.5)

## 2019-01-08 LAB — CK: Total CK: 99 U/L (ref 32–182)

## 2019-01-11 ENCOUNTER — Telehealth: Payer: Self-pay | Admitting: Family Medicine

## 2019-01-11 NOTE — Telephone Encounter (Signed)
Copied from [place] [phone]. Topic: General - Inquiry >> Jan 10, 2019  2:35 PM Rutherford Nail, [place] wrote: Reason for CRM: Patient calling to check the status of her results from 01/07/2019. States that she was told that she would receive a call this morning and has not heard anything. Please advise. CV#: [phone]

## 2019-01-12 ENCOUNTER — Emergency Department (HOSPITAL_COMMUNITY): Payer: Medicare Other

## 2019-01-12 ENCOUNTER — Encounter: Payer: Medicare Other | Admitting: Family Medicine

## 2019-01-12 ENCOUNTER — Emergency Department (HOSPITAL_COMMUNITY)
Admission: EM | Admit: 2019-01-12 | Discharge: 2019-01-12 | Disposition: A | Discharge status: Home / Self Care | Payer: Medicare Other | Attending: Emergency Medicine | Admitting: Emergency Medicine

## 2019-01-12 ENCOUNTER — Other Ambulatory Visit: Payer: Self-pay

## 2019-01-12 ENCOUNTER — Encounter (HOSPITAL_COMMUNITY): Payer: Self-pay

## 2019-01-12 DIAGNOSIS — E1165 Type 2 diabetes mellitus with hyperglycemia: Secondary | ICD-10-CM | POA: Diagnosis not present

## 2019-01-12 DIAGNOSIS — R739 Hyperglycemia, unspecified: Secondary | ICD-10-CM | POA: Diagnosis not present

## 2019-01-12 DIAGNOSIS — I10 Essential (primary) hypertension: Secondary | ICD-10-CM | POA: Diagnosis not present

## 2019-01-12 DIAGNOSIS — R05 Cough: Secondary | ICD-10-CM | POA: Diagnosis not present

## 2019-01-12 DIAGNOSIS — Z794 Long term (current) use of insulin: Secondary | ICD-10-CM | POA: Diagnosis not present

## 2019-01-12 DIAGNOSIS — Z79899 Other long term (current) drug therapy: Secondary | ICD-10-CM | POA: Diagnosis not present

## 2019-01-12 DIAGNOSIS — R1031 Right lower quadrant pain: Secondary | ICD-10-CM | POA: Diagnosis present

## 2019-01-12 DIAGNOSIS — Z87891 Personal history of nicotine dependence: Secondary | ICD-10-CM | POA: Diagnosis not present

## 2019-01-12 DIAGNOSIS — R109 Unspecified abdominal pain: Secondary | ICD-10-CM | POA: Diagnosis not present

## 2019-01-12 DIAGNOSIS — E860 Dehydration: Secondary | ICD-10-CM | POA: Diagnosis not present

## 2019-01-12 LAB — BLOOD GAS, VENOUS
Acid-Base Excess: 1.2 mmol/L (ref 0.0–2.0)
Bicarbonate: 26.8 mmol/L (ref 20.0–28.0)
O2 Saturation: 54.6 %
Patient temperature: 37
pCO2, Ven: 48.5 mmHg (ref 44.0–60.0)
pH, Ven: 7.361 (ref 7.250–7.430)
pO2, Ven: 32.1 mmHg (ref 32.0–45.0)

## 2019-01-12 LAB — POCT I-STAT EG7
Acid-Base Excess: 1 mmol/L (ref 0.0–2.0)
Bicarbonate: 27.2 mmol/L (ref 20.0–28.0)
Calcium, Ion: 1.24 mmol/L (ref 1.15–1.40)
HCT: 43 % (ref 36.0–46.0)
Hemoglobin: 14.6 g/dL (ref 12.0–15.0)
O2 Saturation: 49 %
Potassium: 4.3 mmol/L (ref 3.5–5.1)
Sodium: 131 mmol/L — ABNORMAL LOW (ref 135–145)
TCO2: 29 mmol/L (ref 22–32)
pCO2, Ven: 46 mmHg (ref 44.0–60.0)
pH, Ven: 7.38 (ref 7.250–7.430)
pO2, Ven: 27 mmHg — CL (ref 32.0–45.0)

## 2019-01-12 LAB — URINALYSIS, ROUTINE W REFLEX MICROSCOPIC
Bacteria, UA: NONE SEEN
Bilirubin Urine: NEGATIVE
Glucose, UA: >=500 mg/dL — AB
Hgb urine dipstick: NEGATIVE
Ketones, ur: NEGATIVE mg/dL
Leukocytes,Ua: NEGATIVE
Nitrite: NEGATIVE
Protein, ur: NEGATIVE mg/dL
Specific Gravity, Urine: 1.023 (ref 1.005–1.030)
pH: 5 (ref 5.0–8.0)

## 2019-01-12 LAB — CBC WITH DIFFERENTIAL/PLATELET
Abs Immature Granulocytes: 0.06 10*3/uL (ref 0.00–0.07)
Basophils Absolute: 0.1 10*3/uL (ref 0.0–0.1)
Basophils Relative: 0 %
Eosinophils Absolute: 0.7 10*3/uL — ABNORMAL HIGH (ref 0.0–0.5)
Eosinophils Relative: 6 %
HCT: 40.4 % (ref 36.0–46.0)
Hemoglobin: 13.8 g/dL (ref 12.0–15.0)
Immature Granulocytes: 1 %
Lymphocytes Relative: 35 %
Lymphs Abs: 4.4 10*3/uL — ABNORMAL HIGH (ref 0.7–4.0)
MCH: 28.8 pg (ref 26.0–34.0)
MCHC: 34.2 g/dL (ref 30.0–36.0)
MCV: 84.2 fL (ref 80.0–100.0)
Monocytes Absolute: 1 10*3/uL (ref 0.1–1.0)
Monocytes Relative: 8 %
Neutro Abs: 6.4 10*3/uL (ref 1.7–7.7)
Neutrophils Relative %: 50 %
Platelets: 388 10*3/uL (ref 150–400)
RBC: 4.8 MIL/uL (ref 3.87–5.11)
RDW: 11.6 % (ref 11.5–15.5)
WBC: 12.6 10*3/uL — ABNORMAL HIGH (ref 4.0–10.5)
nRBC: 0 % (ref 0.0–0.2)

## 2019-01-12 LAB — HEPATIC FUNCTION PANEL
ALT: 48 U/L — ABNORMAL HIGH (ref 0–44)
AST: 42 U/L — ABNORMAL HIGH (ref 15–41)
Albumin: 4.1 g/dL (ref 3.5–5.0)
Alkaline Phosphatase: 140 U/L — ABNORMAL HIGH (ref 38–126)
Bilirubin, Direct: 0.2 mg/dL (ref 0.0–0.2)
Indirect Bilirubin: 0.3 mg/dL (ref 0.3–0.9)
Total Bilirubin: 0.5 mg/dL (ref 0.3–1.2)
Total Protein: 8.1 g/dL (ref 6.5–8.1)

## 2019-01-12 LAB — BASIC METABOLIC PANEL
Anion gap: 11 (ref 5–15)
BUN: 13 mg/dL (ref 6–20)
CO2: 25 mmol/L (ref 22–32)
Calcium: 9.7 mg/dL (ref 8.9–10.3)
Chloride: 94 mmol/L — ABNORMAL LOW (ref 98–111)
Creatinine, Ser: 0.91 mg/dL (ref 0.44–1.00)
GFR calc Af Amer: >60 mL/min (ref >60)
GFR calc non Af Amer: >60 mL/min (ref >60)
Glucose, Bld: 573 mg/dL (ref 70–99)
Potassium: 4.1 mmol/L (ref 3.5–5.1)
Sodium: 130 mmol/L — ABNORMAL LOW (ref 135–145)

## 2019-01-12 LAB — CBG MONITORING, ED
Glucose-Capillary: 590 mg/dL (ref 70–99)
Glucose-Capillary: 328 mg/dL — ABNORMAL HIGH (ref 70–99)

## 2019-01-12 LAB — TROPONIN I: Troponin I: <0.03 ng/mL (ref <0.03)

## 2019-01-12 IMAGING — DX PORTABLE CHEST - 1 VIEW
1 series · 1 of 1 positions shown · non-contrast
Comparison: [DATE]

CLINICAL DATA: Cough and elevated blood sugar

EXAM:
PORTABLE CHEST 1 VIEW

[chest ap]
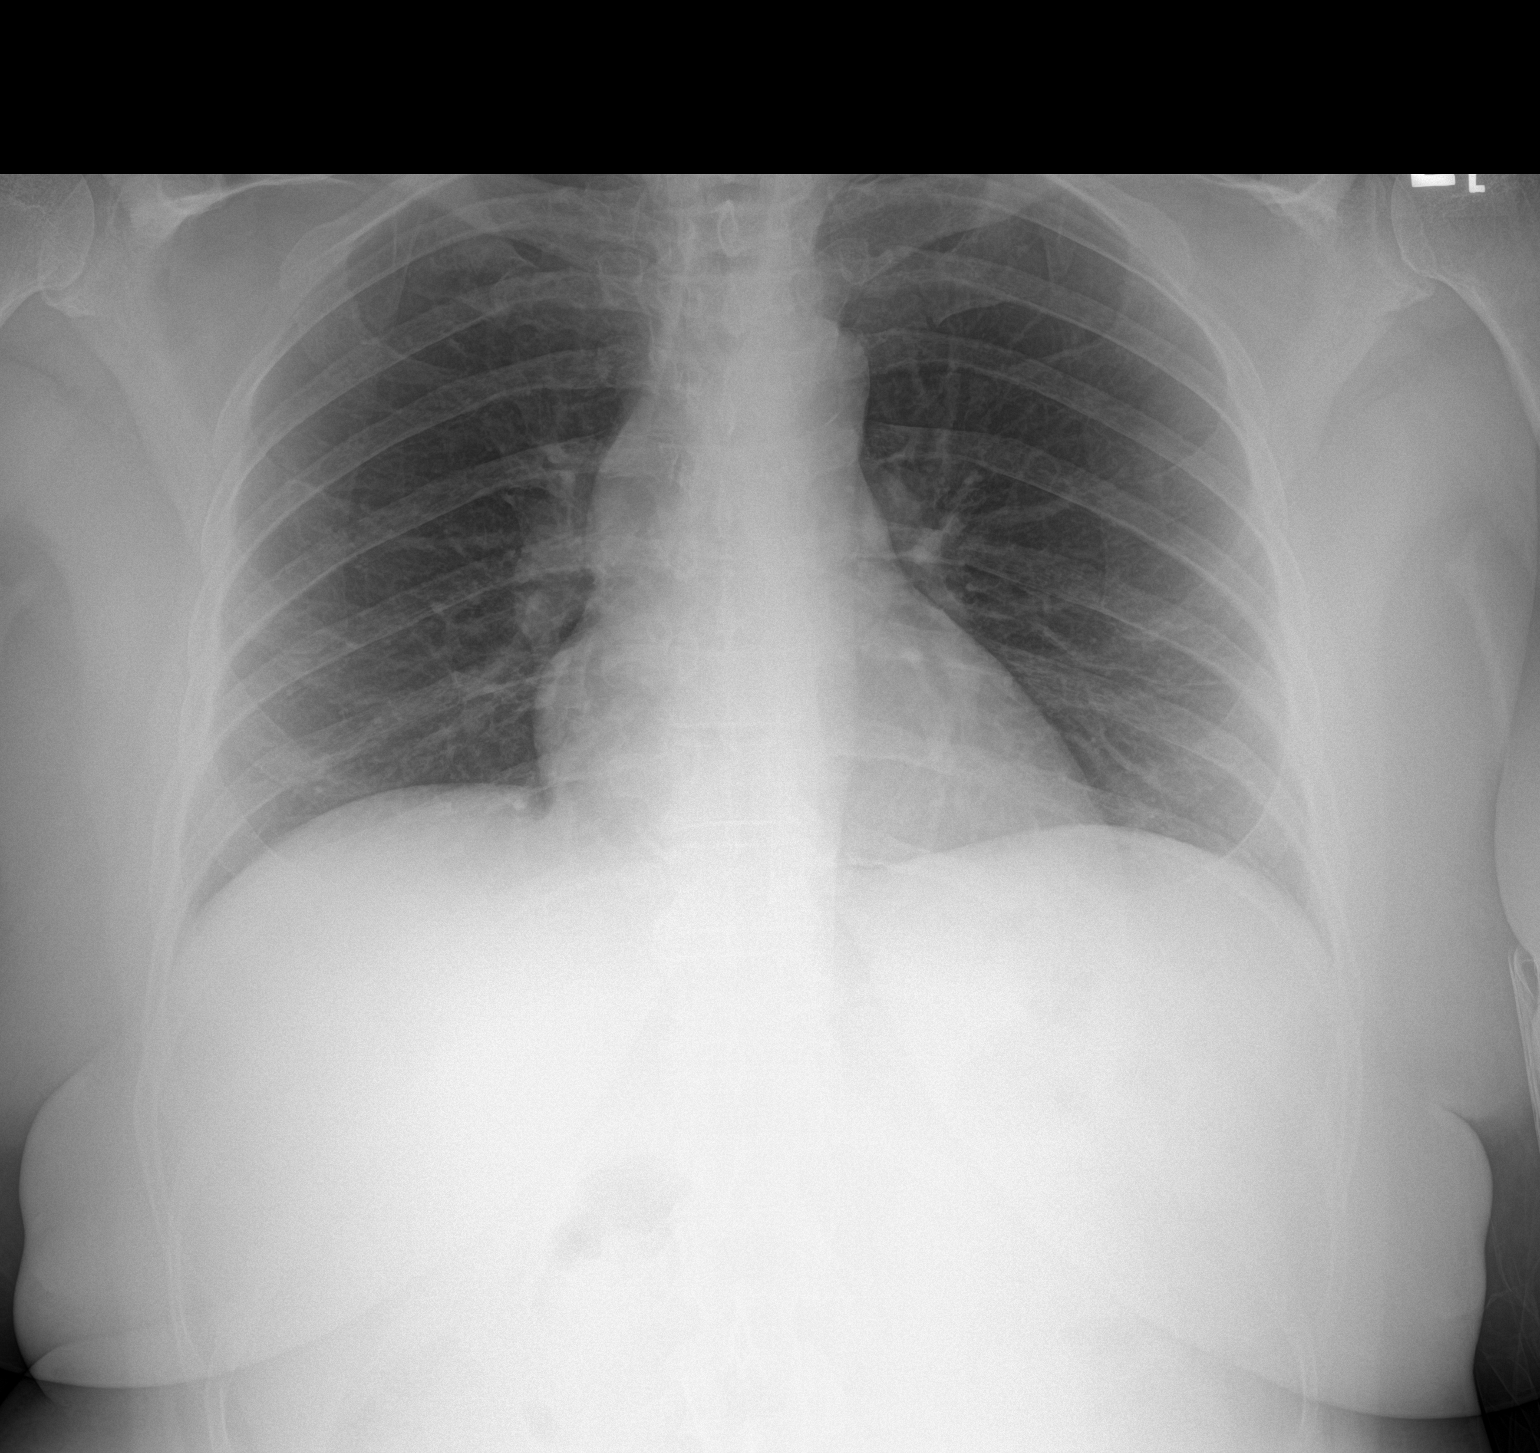

[1 of 1 positions shown; findings below may reference images not displayed]

FINDINGS: The heart size and mediastinal contours are within normal limits.
Both lungs are clear. The visualized skeletal structures are
unremarkable.
IMPRESSION: No active disease.

## 2019-01-12 IMAGING — CT CT ABDOMEN AND PELVIS WITH CONTRAST
2 of 5 series · 16 of 46 positions shown, 18 images · IV contrast (ISOVUE)
Comparison: None.

CLINICAL DATA: Elevated blood pressure abdominal pain

EXAM:
CT ABDOMEN AND PELVIS WITH CONTRAST
TECHNIQUE: Multidetector CT imaging of the abdomen and pelvis was performed
using the standard protocol following bolus administration of
intravenous contrast.
CONTRAST:  100mL OMNIPAQUE IOHEXOL 300 MG/ML  SOLN

[Series 2: axial st · axial · 0.83mm/px · z∈[+719,+1129]mm · 13 of 96 slices shown, 15 images]
[im 7/96  soft-tissue]
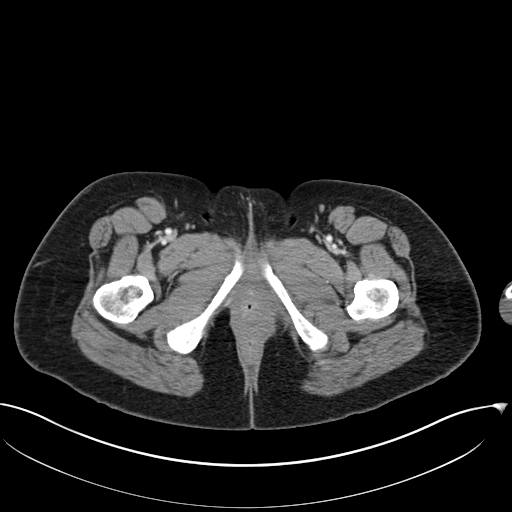
[im 7/96  bone]
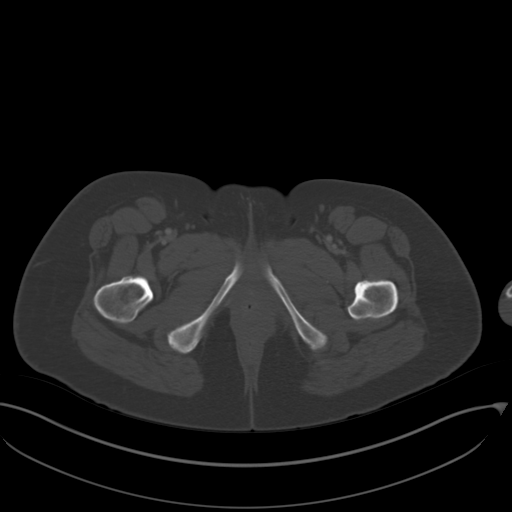
[im 14/96  soft-tissue]
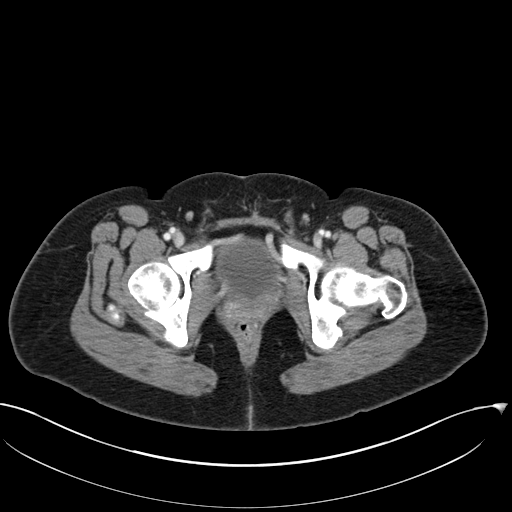
[im 21/96  soft-tissue]
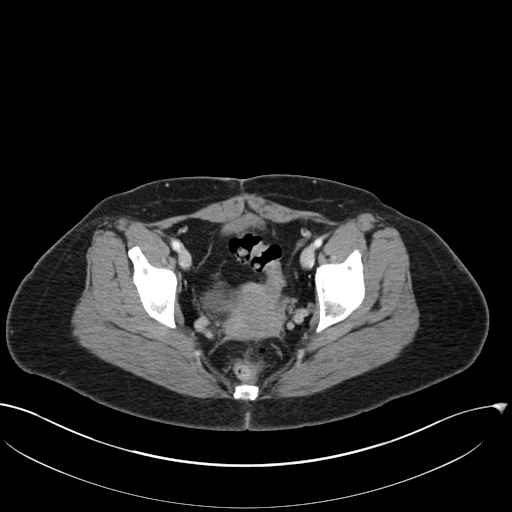
[im 28/96  soft-tissue]
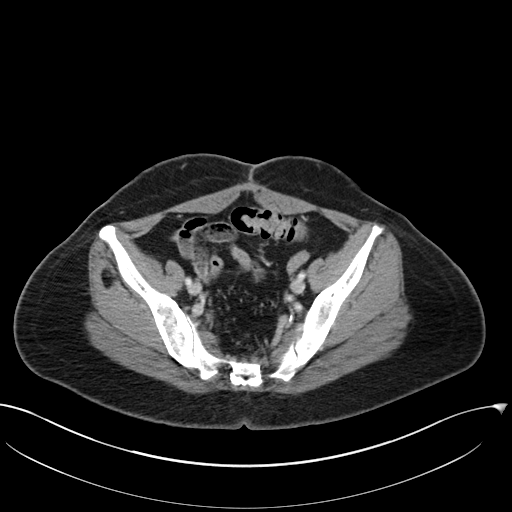
[im 34/96  soft-tissue]
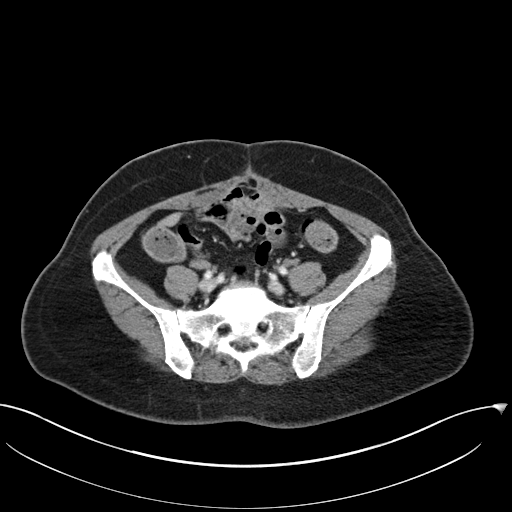
[im 41/96  soft-tissue]
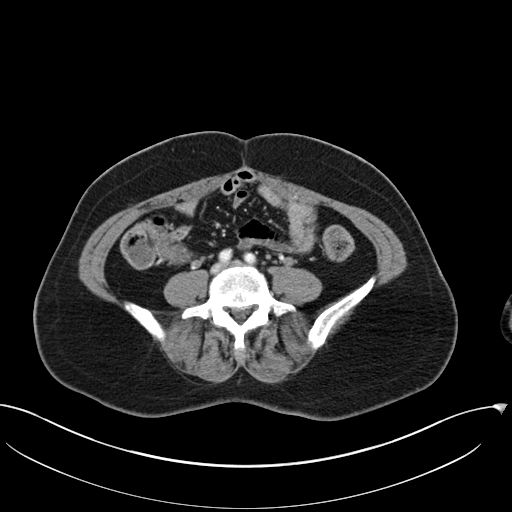
[im 48/96  soft-tissue]
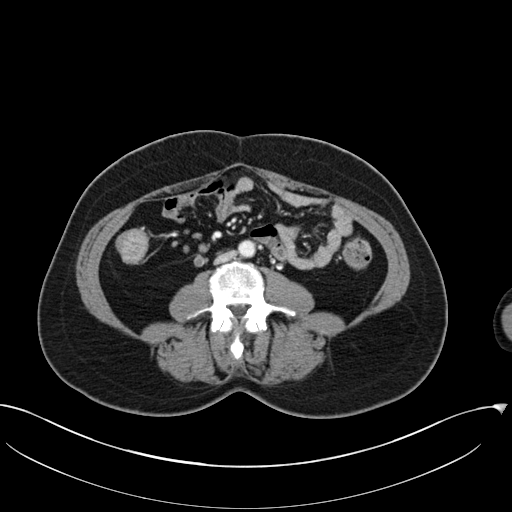
[im 55/96  soft-tissue]
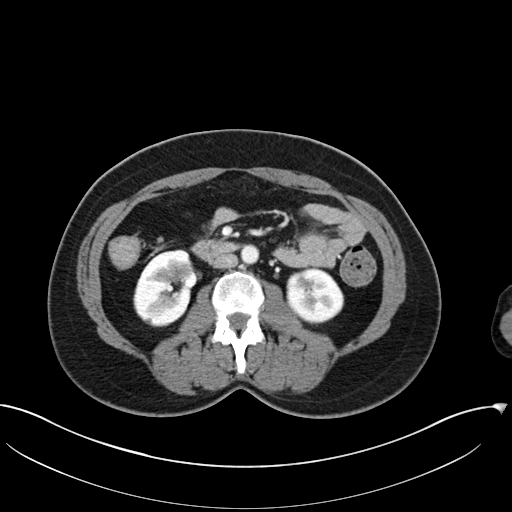
[im 62/96  soft-tissue]
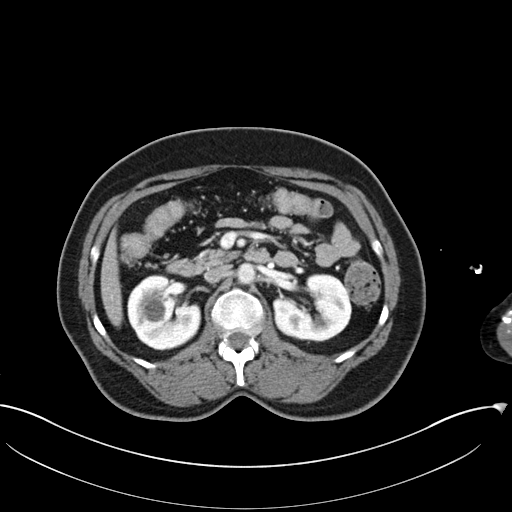
[im 62/96  bone]
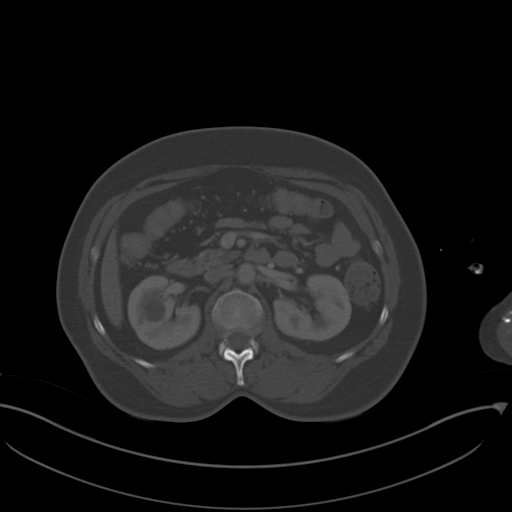
[im 68/96  soft-tissue]
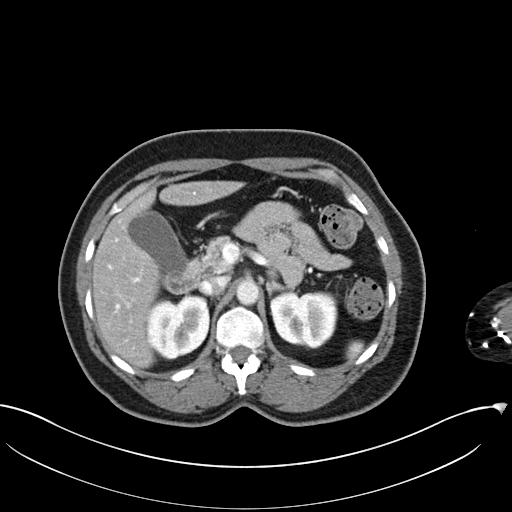
[im 75/96  soft-tissue]
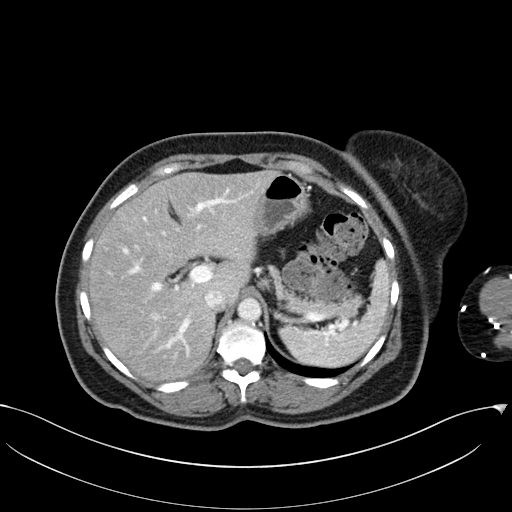
[im 82/96  soft-tissue]
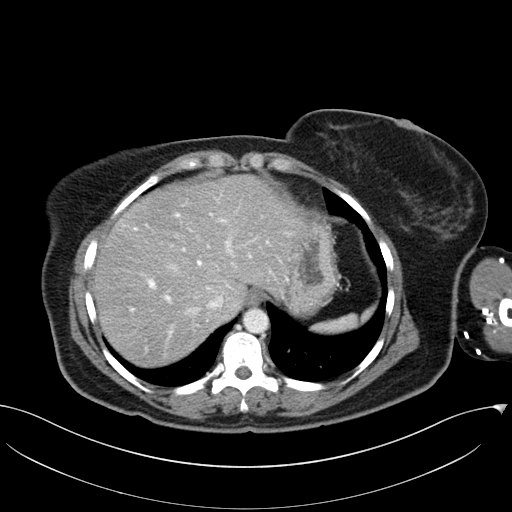
[im 89/96  soft-tissue]
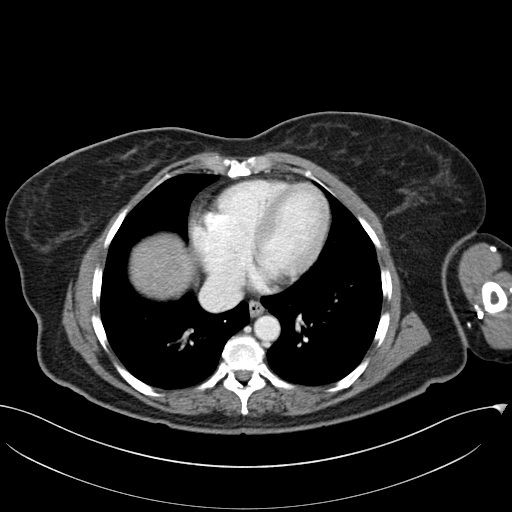

[Series 5: coronal st · coronal · 0.72mm/px · 3 of 127 slices shown]
[im 43/127  soft-tissue]
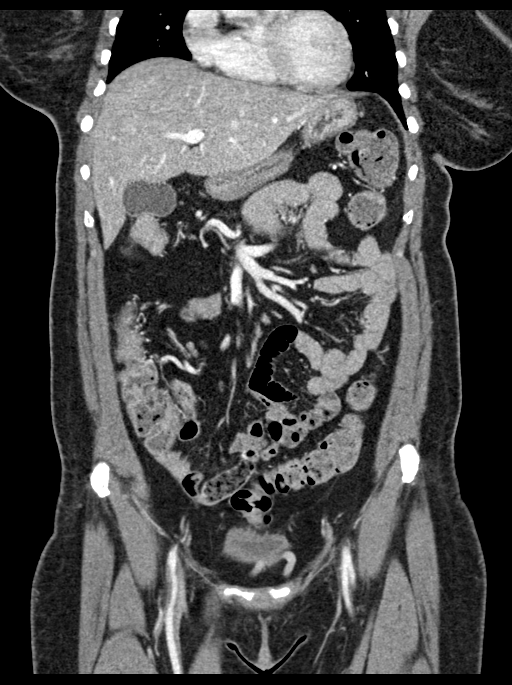
[im 57/127  soft-tissue]
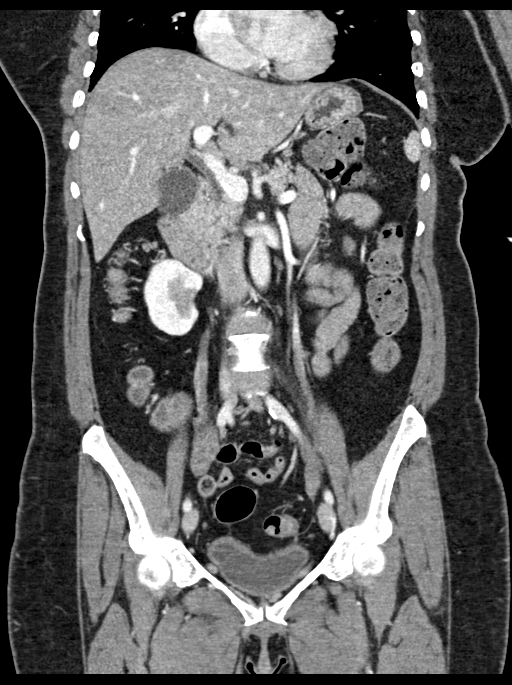
[im 71/127  soft-tissue]
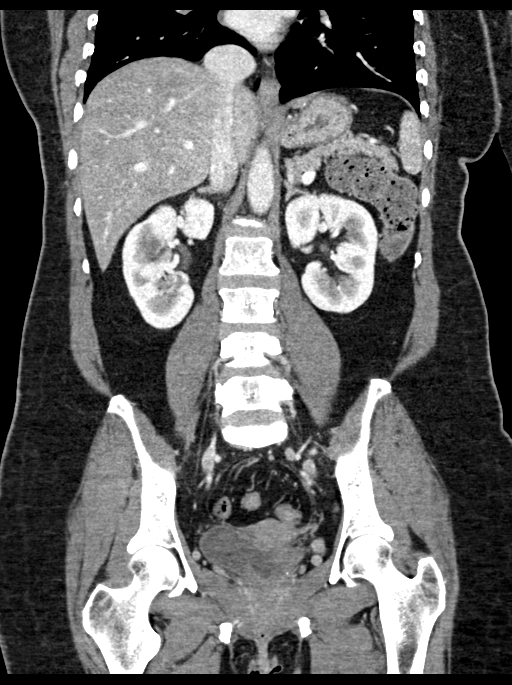

[16 of 46 positions shown; findings below may reference images not displayed]

FINDINGS: Lower chest: Lung bases demonstrate no acute consolidation or
effusion. Heart size is normal

Hepatobiliary: No focal liver abnormality is seen. No gallstones,
gallbladder wall thickening, or biliary dilatation.

Pancreas: Unremarkable. No pancreatic ductal dilatation or
surrounding inflammatory changes.

Spleen: Normal in size without focal abnormality.

Adrenals/Urinary Tract: Adrenal glands are normal. No
hydronephrosis. Hypodense lesion upper pole right kidney measures 14
mm, density values not consistent with simple cysts. Cortical
scarring at the lower poles of both kidneys. Subcentimeter
hypodensities within both kidneys too small to further characterize.
Bladder is normal

Stomach/Bowel: Stomach is within normal limits. Appendix non
identified corresponding to history of appendectomy. No evidence of
bowel wall thickening, distention, or inflammatory changes.

Vascular/Lymphatic: Mild aortic atherosclerosis without aneurysm. No
significantly enlarged lymph nodes

Reproductive: No adnexal mass.  History of hysterectomy.

Other: Negative for free air or free fluid.

Musculoskeletal: No acute or significant osseous findings.
IMPRESSION: 1. No CT evidence for acute intra-abdominal or pelvic abnormality.
2. Indeterminate 14 mm hypodense lesion upper pole right kidney.
This may be further evaluated with nonemergent MRI.

## 2019-01-12 MED ORDER — SODIUM CHLORIDE 0.9 % IV BOLUS
2000.0000 mL | Freq: Once | INTRAVENOUS | Status: AC
  Administered 2019-01-12: 2000 mL via INTRAVENOUS

## 2019-01-12 MED ORDER — INSULIN ASPART 100 UNIT/ML ~~LOC~~ SOLN
10.0000 [IU] | Freq: Once | SUBCUTANEOUS | Status: AC
  Administered 2019-01-12: 10 [IU] via SUBCUTANEOUS
  Filled 2019-01-12: qty 1

## 2019-01-12 MED ORDER — PROCHLORPERAZINE EDISYLATE 10 MG/2ML IJ SOLN
10.0000 mg | Freq: Once | INTRAMUSCULAR | Status: AC
  Administered 2019-01-12: 10 mg via INTRAVENOUS
  Filled 2019-01-12: qty 2

## 2019-01-12 MED ORDER — DIPHENHYDRAMINE HCL 50 MG/ML IJ SOLN
25.0000 mg | Freq: Once | INTRAMUSCULAR | Status: AC
  Administered 2019-01-12: 25 mg via INTRAVENOUS
  Filled 2019-01-12: qty 1

## 2019-01-12 MED ORDER — IOHEXOL 300 MG/ML  SOLN
100.0000 mL | Freq: Once | INTRAMUSCULAR | Status: AC | PRN
  Administered 2019-01-12: 100 mL via INTRAVENOUS

## 2019-01-12 NOTE — Telephone Encounter (Signed)
Call to pt at request of Dr. Pamella Pert to assess current status.  Pt reports ongoing HA, fatigue as well as various body aches including RLE, hip, back, and now neck.  Reports ongoing nausea but no vomiting x 2 days.  States her FSBS was 339 two days ago.  Took again while on phone and glucometer read "high".  Advised patient that based on her lab results, ongoing symptoms, and current glucometer reading, we are concerned that her sugar is not being maintained with current medication and potentially heading toward DKA.  Pt agrees to let husband take her to Emergency Room for eval this afternoon.

## 2019-01-12 NOTE — Discharge Instructions (Signed)
Call your family doctor and let them know that your blood sugar has been running high and see if they want to change your medication regimen.  Your CT scan was negative for acute abdominal issue however there was a soft tissue density that was seen on 1 of the kidneys that they thought should be evaluated with an MRI as an outpatient.  Please discuss this with your family doctor.

## 2019-01-12 NOTE — ED Notes (Signed)
Patient transported to CT 

## 2019-01-12 NOTE — Progress Notes (Signed)
Called pt to prepare for telemed, says she just got off the phone with the triage nurse ad was told to go to er due to high blood sugar. She will follow up once she is release

## 2019-01-12 NOTE — ED Notes (Signed)
Date and time results received: 01/12/19 4:47 PM   Test: Glucose Critical Value: 573  Name of Provider Notified: Tyrone Nine  Orders Received? Or Actions Taken?:

## 2019-01-12 NOTE — ED Triage Notes (Signed)
Pt states that her FSBG has been reading HIGH. Pt states that her BP has high as well. PCP told her to come to the hospital.  Pt talks about constant thirst and sweet taste in mouth.

## 2019-01-12 NOTE — ED Provider Notes (Signed)
[place] DEPT Provider Note   CSN: [id number] Arrival date & time: 01/12/19  1529    History   Chief Complaint Chief Complaint  Patient presents with   Hyperglycemia   Hypertension    HPI Beverly Ramirez is a 57 y.o. female.     58 yo F with a chief complaint of hyperglycemia and headache.  She has been having polydipsia polyuria polyphagia.  Going on for about a week now.  She initially started with diffuse myalgias and has had cough and congestion.  Started having some right lower quadrant abdominal pain feels like her prior colitis that is only in 1 spot and not diffuse like it typically is.  Has had some nausea and vomiting off and on.  She has been very fatigued.  Had some chest pain that she described as a sharp spot with coughing which had resolved.  The history is provided by the patient.  Hyperglycemia  Associated symptoms: abdominal pain (rlq), chest pain (earlier in the week, now resolved), nausea and vomiting   Associated symptoms: no dizziness, no dysuria, no fever and no shortness of breath   Hypertension  Associated symptoms include chest pain (earlier in the week, now resolved) and abdominal pain (rlq). Pertinent negatives include no headaches and no shortness of breath.    Past Medical History:  Diagnosis Date   Anemia    Carpal tunnel syndrome    bilateral   Chronic heel pain    DJD (degenerative joint disease) of cervical spine    MRI 2017   Hyperlipidemia    Plantar fasciitis    bilateral   Tendonitis    left hand/wrist   UC (ulcerative colitis) [facility])     Patient Active Problem List   Diagnosis Date Noted   De Quervain's tenosynovitis, left 11/16/2018   Bilateral carpal tunnel syndrome 04/13/2018   Leukocytosis 10/04/2017   Hyperlipidemia 08/04/2017   Ulcerative colitis without complications ([place]) 32/67/1245   Plantar fasciitis 05/28/2017    Past Surgical History:  Procedure Laterality  Date   ABDOMINAL HYSTERECTOMY     APPENDECTOMY     CESAREAN SECTION     x4   COLONOSCOPY     Multiple in [place]   ESOPHAGOGASTRODUODENOSCOPY       OB History    Gravida  4   Para  4   Term      Preterm      AB      Living  4     SAB      TAB      Ectopic      Multiple      Live Births               Home Medications    Prior to Admission medications   Medication Sig Start Date End Date Taking? Authorizing Provider  amLODipine (NORVASC) 5 MG tablet Take 1 tablet (5 mg total) by mouth daily. 05/07/18   Rutherford Guys, MD  blood glucose meter kit and supplies KIT Based on patient's insurance. Check twice a day. Dx R73.9, Z79.52 09/24/18   Rutherford Guys, MD  doxycycline (VIBRA-TABS) 100 MG tablet Take 1 tablet (100 mg total) by mouth 2 (two) times daily. 12/27/18   Wendie Agreste, MD  fluticasone (FLONASE) 50 MCG/ACT nasal spray Place 2 sprays into both nostrils daily. 12/23/18   Jacqlyn Larsen, PA-C  Insulin Glargine (LANTUS SOLOSTAR) 100 UNIT/ML Solostar Pen Inject 20 Units into the skin  at bedtime. 11/11/18   LampteyMyrene Galas, MD  Insulin Syringe-Needle U-100 (INSULIN SYRINGE 1CC/31GX5/16") 31G X 5/16" 1 ML MISC Inject 0.08 ( 8 Units)  into the skin 3 times daily before meals. 10/16/18   Rutherford Guys, MD  Lancets [facility] DELICA PLUS [id number]) MISC USE TO CHECK BLOOD SUGAR TWICE A DAY 11/19/18   Rutherford Guys, MD  mesalamine (LIALDA) 1.2 g EC tablet Take 2 tablets (2.4 g total) by mouth 2 (two) times daily. 11/25/18 11/20/19  Gatha Mayer, MD  NOVOLIN R 100 UNIT/ML injection INJECT 8 UNITS INTO THE SKIN 3 TIMES DAILY BEFORE MEALS. 12/26/18   Rutherford Guys, MD  [facility] VERIO test strip CHECK BLOOD SUGAR TWICE A DAY. 12/08/18   Rutherford Guys, MD  traMADol Veatrice Bourbon) 50 MG tablet Take 1 tablet (50 mg total) by mouth every 8 (eight) hours as needed. Patient not taking: Reported on 12/27/2018 11/10/18   Rutherford Guys, MD    Family  History Family History  Problem Relation Age of Onset   Stroke Mother    Diabetes Mother    Hypertension Mother    Lung cancer Mother    Sickle cell trait Mother    Breast cancer Maternal Aunt    Diabetes Maternal Aunt    Lung cancer Maternal Aunt    Lung cancer Maternal Aunt    Lung cancer Maternal Aunt    Colon cancer Cousin 30   Sickle cell trait Father    Sickle cell anemia Sister 32   Lung cancer Maternal Grandmother    Sickle cell anemia Sister    Other Sister        accident and blood clot formed    Social History Social History   Tobacco Use   Smoking status: Former Smoker    Types: Cigarettes    Last attempt to quit: 10/13/2006    Years since quitting: 12.2   Smokeless tobacco: Never Used  Substance Use Topics   Alcohol use: Yes    Comment: occasional   Drug use: No     Allergies   Codeine and Latex   Review of Systems Review of Systems  Constitutional: Negative for chills and fever.  HENT: Negative for congestion and rhinorrhea.   Eyes: Negative for redness and visual disturbance.  Respiratory: Negative for shortness of breath and wheezing.   Cardiovascular: Positive for chest pain (earlier in the week, now resolved). Negative for palpitations.  Gastrointestinal: Positive for abdominal pain (rlq), nausea and vomiting.  Genitourinary: Negative for dysuria and urgency.  Musculoskeletal: Negative for arthralgias and myalgias.  Skin: Negative for pallor and wound.  Neurological: Negative for dizziness and headaches.     Physical Exam Updated Vital Signs BP 122/80    Pulse 77    Temp 98.1 F (36.7 C) (Oral)    Resp 15    Ht 5' 2"  (1.575 m)    Wt 75 kg    SpO2 97%    BMI 30.24 kg/m   Physical Exam Vitals signs and nursing note reviewed.  Constitutional:      General: She is not in acute distress.    Appearance: She is well-developed. She is not diaphoretic.  HENT:     Head: Normocephalic and atraumatic.  Eyes:     Pupils:  Pupils are equal, round, and reactive to light.  Neck:     Musculoskeletal: Normal range of motion and neck supple.  Cardiovascular:     Rate and Rhythm: Normal rate and  regular rhythm.     Heart sounds: No murmur. No friction rub. No gallop.   Pulmonary:     Effort: Pulmonary effort is normal.     Breath sounds: No wheezing or rales.  Abdominal:     General: There is no distension.     Palpations: Abdomen is soft.     Tenderness: There is abdominal tenderness (pain to the RLQ, mild guarding). There is guarding.  Musculoskeletal:        General: No tenderness.  Skin:    General: Skin is warm and dry.  Neurological:     Mental Status: She is alert and oriented to person, place, and time.  Psychiatric:        Behavior: Behavior normal.      ED Treatments / Results  Labs (all labs ordered are listed, but only abnormal results are displayed) Labs Reviewed  CBC WITH DIFFERENTIAL/PLATELET - Abnormal; Notable for the following components:      Result Value   WBC 12.6 (*)    Lymphs Abs 4.4 (*)    Eosinophils Absolute 0.7 (*)    All other components within normal limits  BASIC METABOLIC PANEL - Abnormal; Notable for the following components:   Sodium 130 (*)    Chloride 94 (*)    Glucose, Bld 573 (*)    All other components within normal limits  HEPATIC FUNCTION PANEL - Abnormal; Notable for the following components:   AST 42 (*)    ALT 48 (*)    Alkaline Phosphatase 140 (*)    All other components within normal limits  URINALYSIS, ROUTINE W REFLEX MICROSCOPIC - Abnormal; Notable for the following components:   Color, Urine STRAW (*)    Glucose, UA >=500 (*)    All other components within normal limits  CBG MONITORING, ED - Abnormal; Notable for the following components:   Glucose-Capillary 590 (*)    All other components within normal limits  POCT I-STAT EG7 - Abnormal; Notable for the following components:   pO2, Ven 27.0 (*)    Sodium 131 (*)    All other components  within normal limits  CBG MONITORING, ED - Abnormal; Notable for the following components:   Glucose-Capillary 328 (*)    All other components within normal limits  TROPONIN I  BLOOD GAS, VENOUS    EKG None  Radiology Ct Abdomen Pelvis W Contrast  Result Date: 01/12/2019 CLINICAL DATA:  Elevated blood pressure abdominal pain EXAM: CT ABDOMEN AND PELVIS WITH CONTRAST TECHNIQUE: Multidetector CT imaging of the abdomen and pelvis was performed using the standard protocol following bolus administration of intravenous contrast. CONTRAST:  173m OMNIPAQUE IOHEXOL 300 MG/ML  SOLN COMPARISON:  None. FINDINGS: Lower chest: Lung bases demonstrate no acute consolidation or effusion. Heart size is normal Hepatobiliary: No focal liver abnormality is seen. No gallstones, gallbladder wall thickening, or biliary dilatation. Pancreas: Unremarkable. No pancreatic ductal dilatation or surrounding inflammatory changes. Spleen: Normal in size without focal abnormality. Adrenals/Urinary Tract: Adrenal glands are normal. No hydronephrosis. Hypodense lesion upper pole right kidney measures 14 mm, density values not consistent with simple cysts. Cortical scarring at the lower poles of both kidneys. Subcentimeter hypodensities within both kidneys too small to further characterize. Bladder is normal Stomach/Bowel: Stomach is within normal limits. Appendix non identified corresponding to history of appendectomy. No evidence of bowel wall thickening, distention, or inflammatory changes. Vascular/Lymphatic: Mild aortic atherosclerosis without aneurysm. No significantly enlarged lymph nodes Reproductive: No adnexal mass.  History of hysterectomy. Other: Negative  for free air or free fluid. Musculoskeletal: No acute or significant osseous findings. IMPRESSION: 1. No CT evidence for acute intra-abdominal or pelvic abnormality. 2. Indeterminate 14 mm hypodense lesion upper pole right kidney. This may be further evaluated with  nonemergent MRI. Electronically Signed   By: Donavan Foil M.D.   On: 01/12/2019 19:53   Dg Chest Port 1 View  Result Date: 01/12/2019 CLINICAL DATA:  Cough and elevated blood sugar EXAM: PORTABLE CHEST 1 VIEW COMPARISON:  12/23/2018 FINDINGS: The heart size and mediastinal contours are within normal limits. Both lungs are clear. The visualized skeletal structures are unremarkable. IMPRESSION: No active disease. Electronically Signed   By: Inez Catalina M.D.   On: 01/12/2019 16:37    Procedures Procedures (including critical care time) Procedure note: Ultrasound Guided Peripheral IV Ultrasound guided peripheral 1.88 inch angiocath IV placement performed by me. Indications: Nursing unable to place IV. Details: The antecubital fossa and upper arm were evaluated with a multifrequency linear probe. Patent brachial veins were noted. 1 attempt was made to cannulate a vein under realtime US guidance with successful cannulation of the vein and catheter placement. There is return of non-pulsatile dark red blood. The patient tolerated the procedure well without complications. Images archived electronically.  CPT codes: [phone] and [phone]  Medications Ordered in ED Medications  insulin aspart (novoLOG) injection 10 Units (has no administration in time range)  sodium chloride 0.9 % bolus 2,000 mL (2,000 mLs Intravenous New Bag/Given 01/12/19 1624)  prochlorperazine (COMPAZINE) injection 10 mg (10 mg Intravenous Given 01/12/19 1702)  diphenhydrAMINE (BENADRYL) injection 25 mg (25 mg Intravenous Given 01/12/19 1702)  iohexol (OMNIPAQUE) 300 MG/ML solution 100 mL (100 mLs Intravenous Contrast Given 01/12/19 1744)     Initial Impression / Assessment and Plan / ED Course  I have reviewed the triage vital signs and the nursing notes.  Pertinent labs & imaging results that were available during my care of the patient were reviewed by me and considered in my medical decision making (see chart for details).        58  yo F who has had chief complaints of polydipsia polyphagia and polyuria.  She is having a headache with this which I think is likely due to dehydration.  We will give a headache cocktail and fluids.  Her neurologic exam is unremarkable.  I am wondering why she actually is hyperglycemic.  She was having some chest pain at the onset of this did seem related to a viral illness.  Though now she does have right lower quadrant abdominal pain.  She had a prior appendectomy but will obtain a CT scan due to the amount of pain she is having. Reassess.   Hyperglycemic without anion gap.  No noted acidosis. Mild leukocytosis of 12. CXR viewed by me without focal infiltrate.   Patient's blood sugars improved about 200, her headache is significantly improved.  CT scan negative for acute intra-abdominal pathology.  Will discharge the patient home.  PCP follow-up.  8:18 PM:  I have discussed the diagnosis/risks/treatment options with the patient and family and believe the pt to be eligible for discharge home to follow-up with PCP. We also discussed returning to the ED immediately if new or worsening sx occur. We discussed the sx which are most concerning (e.g., sudden worsening pain, fever, inability to tolerate by mouth) that necessitate immediate return. Medications administered to the patient during their visit and any new prescriptions provided to the patient are listed below.  Medications given during  this visit Medications  insulin aspart (novoLOG) injection 10 Units (has no administration in time range)  sodium chloride 0.9 % bolus 2,000 mL (2,000 mLs Intravenous New Bag/Given 01/12/19 1624)  prochlorperazine (COMPAZINE) injection 10 mg (10 mg Intravenous Given 01/12/19 1702)  diphenhydrAMINE (BENADRYL) injection 25 mg (25 mg Intravenous Given 01/12/19 1702)  iohexol (OMNIPAQUE) 300 MG/ML solution 100 mL (100 mLs Intravenous Contrast Given 01/12/19 1744)     The patient appears reasonably screen and/or stabilized  for discharge and I doubt any other medical condition or other [facility] requiring further screening, evaluation, or treatment in the ED at this time prior to discharge.    Final Clinical Impressions(s) / ED Diagnoses   Final diagnoses:  Hyperglycemia  Dehydration    ED Discharge Orders    None       Deno Etienne, DO 01/12/19 2018

## 2019-01-12 NOTE — ED Notes (Signed)
Pt taken to CT, CT tech informed this RN that CT unable to be performed at this time d/t lack of appropriate IV access.  Charge RN made aware, requested to attempt [place] IV placement.

## 2019-01-12 NOTE — ED Notes (Signed)
Pt verbalized discharge instructions and follow up care. IV removed. Alert and ambulatory. Family picking pt up

## 2019-01-13 ENCOUNTER — Telehealth: Payer: Self-pay | Admitting: Family Medicine

## 2019-01-13 ENCOUNTER — Other Ambulatory Visit: Payer: Self-pay

## 2019-01-13 ENCOUNTER — Telehealth: Payer: Self-pay

## 2019-01-13 MED ORDER — METFORMIN HCL 500 MG PO TABS: ORAL_TABLET | ORAL | 0 refills | Status: DC

## 2019-01-13 NOTE — Telephone Encounter (Signed)
At MD request, contacted pt.  L/m that we would like to f/u after yesterday's ED visit.  Sending in Rx for Metformin to be taken as instructed on bottle.  Pt may also take Humalog if sugars are not controlled with Metformin.  If refill of Humalog is needed, please let us know.  Provider would like to see pt back in office for follow up, preferably within the week.  Asked pt to c/b re humalog and to schedule f/u appt.

## 2019-01-13 NOTE — Telephone Encounter (Signed)
Check message from [facility] [facility] on 01/13/19. I have made pt appointment for 01/14/19. Pt's blood sugar is so high it just says HIGH. She went to hosp yesterday 01/12/19 and she was there 7 hours. She would like to know what she should do. Please advise at [phone]

## 2019-01-13 NOTE — Telephone Encounter (Signed)
Patient needs a follow up apt. Can be tele med.

## 2019-01-14 ENCOUNTER — Other Ambulatory Visit: Payer: Self-pay

## 2019-01-14 ENCOUNTER — Ambulatory Visit (INDEPENDENT_AMBULATORY_CARE_PROVIDER_SITE_OTHER): Payer: Medicare Other | Admitting: Family Medicine

## 2019-01-14 ENCOUNTER — Encounter: Payer: Self-pay | Admitting: Family Medicine

## 2019-01-14 VITALS — BP 126/80 | HR 77 | Ht 62.0 in | Wt 165.0 lb

## 2019-01-14 DIAGNOSIS — Z7952 Long term (current) use of systemic steroids: Secondary | ICD-10-CM | POA: Diagnosis not present

## 2019-01-14 DIAGNOSIS — I10 Essential (primary) hypertension: Secondary | ICD-10-CM | POA: Diagnosis not present

## 2019-01-14 DIAGNOSIS — Z794 Long term (current) use of insulin: Secondary | ICD-10-CM

## 2019-01-14 DIAGNOSIS — E1165 Type 2 diabetes mellitus with hyperglycemia: Secondary | ICD-10-CM | POA: Diagnosis not present

## 2019-01-14 LAB — POCT URINALYSIS DIP (MANUAL ENTRY)
Bilirubin, UA: NEGATIVE
Blood, UA: NEGATIVE
Glucose, UA: 500 mg/dL — AB
Ketones, POC UA: NEGATIVE mg/dL
Leukocytes, UA: NEGATIVE
Nitrite, UA: NEGATIVE
Protein Ur, POC: NEGATIVE mg/dL
Spec Grav, UA: <=1.005 — AB (ref 1.010–1.025)
Urobilinogen, UA: 0.2 E.U./dL
pH, UA: 6.5 (ref 5.0–8.0)

## 2019-01-14 LAB — GLUCOSE, POCT (MANUAL RESULT ENTRY): POC Glucose: >444 mg/dl (ref 70–99)

## 2019-01-14 NOTE — Progress Notes (Signed)
5/8/20202:21 PM  Beverly Ramirez 11/09/1960, 58 y.o., female [id number]  Chief Complaint  Patient presents with  . Diabetes    went to the er yesterday due to high blood sugar, meter is reading "high"    HPI:   Patient is a 58 y.o. female with past medical history significant for ulcerative colitis, HLP, HTN who presents today for new diagnosis of DM2 per a1c from march 2020  Long standing exposure to oral steroids due to UC Sent to ER due to glucosuria, ketonuria, fatigue and nausea - r/o DKA  Patient states headache returned, very thirst cbg this morning 342, 1 hour after breakfast today 369 lantus 20 units, humalog 8u AC  Lab Results  Component Value Date   HGBA1C 9.5 (A) 11/10/2018   Lab Results  Component Value Date   LDLCALC 151 (H) 01/08/2018   CREATININE 0.91 01/12/2019    Fall Risk  12/27/2018 11/10/2018 09/24/2018 07/16/2018 05/07/2018  Falls in the past year? 0 0 0 0 No  Number falls in past yr: 0 0 - - -  Injury with Fall? 0 0 - - -  Follow up - Falls evaluation completed - - -     Depression screen [facility] 2/9 12/27/2018 09/24/2018 07/16/2018  Decreased Interest 0 0 0  Down, Depressed, Hopeless 0 0 0  PHQ - 2 Score 0 0 0    Allergies  Allergen Reactions  . Codeine Itching, Rash and Hives  . Latex Hives    With the power    Prior to Admission medications   Medication Sig Start Date End Date Taking? Authorizing Provider  amLODipine (NORVASC) 5 MG tablet Take 1 tablet (5 mg total) by mouth daily. 05/07/18   Rutherford Guys, MD  blood glucose meter kit and supplies KIT Based on patient's insurance. Check twice a day. Dx R73.9, Z79.52 09/24/18   Rutherford Guys, MD  doxycycline (VIBRA-TABS) 100 MG tablet Take 1 tablet (100 mg total) by mouth 2 (two) times daily. 12/27/18   Wendie Agreste, MD  fluticasone (FLONASE) 50 MCG/ACT nasal spray Place 2 sprays into both nostrils daily. 12/23/18   Jacqlyn Larsen, PA-C  Insulin Glargine (LANTUS SOLOSTAR) 100 UNIT/ML  Solostar Pen Inject 20 Units into the skin at bedtime. 11/11/18   LampteyMyrene Galas, MD  Insulin Syringe-Needle U-100 (INSULIN SYRINGE 1CC/31GX5/16") 31G X 5/16" 1 ML MISC Inject 0.08 ( 8 Units)  into the skin 3 times daily before meals. 10/16/18   Rutherford Guys, MD  Lancets [facility] DELICA PLUS [id number]) MISC USE TO CHECK BLOOD SUGAR TWICE A DAY 11/19/18   Rutherford Guys, MD  mesalamine (LIALDA) 1.2 g EC tablet Take 2 tablets (2.4 g total) by mouth 2 (two) times daily. 11/25/18 11/20/19  Gatha Mayer, MD  metFORMIN (GLUCOPHAGE) 500 MG tablet Take 1 tablet (500 mg total) by mouth twice daily for two weeks.  Then take 2 tablets (1,035m total) by mouth twice daily. 01/13/19   SRutherford Guys MD  NOVOLIN R 100 UNIT/ML injection INJECT 8 UNITS INTO THE SKIN 3 TIMES DAILY BEFORE MEALS. 12/26/18   SRutherford Guys MD  [facility] CHECK BLOOD SUGAR TWICE A DAY. 12/08/18   SRutherford Guys MD  traMADol (Veatrice Bourbon 50 MG tablet Take 1 tablet (50 mg total) by mouth every 8 (eight) hours as needed. Patient not taking: Reported on 12/27/2018 11/10/18   SRutherford Guys MD    Past Medical History:  Diagnosis Date  .  Anemia   . Carpal tunnel syndrome    bilateral  . Chronic heel pain   . DJD (degenerative joint disease) of cervical spine    MRI 2017  . Hyperlipidemia   . Plantar fasciitis    bilateral  . Tendonitis    left hand/wrist  . UC (ulcerative colitis) ([place])     Past Surgical History:  Procedure Laterality Date  . ABDOMINAL HYSTERECTOMY    . APPENDECTOMY    . CESAREAN SECTION     x4  . COLONOSCOPY     Multiple in [place]  . ESOPHAGOGASTRODUODENOSCOPY      Social History   Tobacco Use  . Smoking status: Former Smoker    Types: Cigarettes    Last attempt to quit: 10/13/2006    Years since quitting: 12.2  . Smokeless tobacco: Never Used  Substance Use Topics  . Alcohol use: Yes    Comment: occasional    Family History  Problem Relation Age of Onset  . Stroke  Mother   . Diabetes Mother   . Hypertension Mother   . Lung cancer Mother   . Sickle cell trait Mother   . Breast cancer Maternal Aunt   . Diabetes Maternal Aunt   . Lung cancer Maternal Aunt   . Lung cancer Maternal Aunt   . Lung cancer Maternal Aunt   . Colon cancer Cousin 49  . Sickle cell trait Father   . Sickle cell anemia Sister 101  . Lung cancer Maternal Grandmother   . Sickle cell anemia Sister   . Other Sister        accident and blood clot formed    ROS Denies fever, chills, nausea, vomiting, SOB, chest pain Per hpi  OBJECTIVE:  Today's Vitals   01/14/19 1423  BP: 126/80  Pulse: 77  SpO2: 97%  Weight: 165 lb (74.8 kg)  Height: 5' 2"  (1.575 m)   There is no height or weight on file to calculate BMI.   Physical Exam Vitals signs and nursing note reviewed.  Constitutional:      Appearance: She is well-developed.  HENT:     Head: Normocephalic and atraumatic.     Mouth/Throat:     Pharynx: No oropharyngeal exudate.  Eyes:     General: No scleral icterus.    Conjunctiva/sclera: Conjunctivae normal.     Pupils: Pupils are equal, round, and reactive to light.  Neck:     Musculoskeletal: Neck supple.  Cardiovascular:     Rate and Rhythm: Normal rate and regular rhythm.     Heart sounds: Normal heart sounds. No murmur. No friction rub. No gallop.   Pulmonary:     Effort: Pulmonary effort is normal.     Breath sounds: Normal breath sounds. No wheezing or rales.  Skin:    General: Skin is warm and dry.  Neurological:     Mental Status: She is alert and oriented to person, place, and time.       Results for orders placed or performed in visit on 01/14/19 (from the past 24 hour(s))  POCT urinalysis dipstick     Status: Abnormal   Collection Time: 01/14/19  2:35 PM  Result Value Ref Range   Color, UA yellow yellow   Clarity, UA clear clear   Glucose, UA =500 (A) negative mg/dL   Bilirubin, UA negative negative   Ketones, POC UA negative negative  mg/dL   Spec Grav, UA <=1.005 (A) 1.010 - 1.025   Blood, UA negative  negative   pH, UA 6.5 5.0 - 8.0   Protein Ur, POC negative negative mg/dL   Urobilinogen, UA 0.2 0.2 or 1.0 E.U./dL   Nitrite, UA Negative Negative   Leukocytes, UA Negative Negative  POCT glucose (manual entry)     Status: None   Collection Time: 01/14/19  2:36 PM  Result Value Ref Range   POC Glucose >444 70 - 99 mg/dl    ASSESSMENT and PLAN  1. Type 2 diabetes mellitus with hyperglycemia, with long-term current use of insulin ([place]) 2. Long term systemic steroid user, intermittent New diagnosis of DM2, insulin dependent. Hyperglycemic, symptomatic.  Discussed titration of lantus 5 units q 3 days until fasting ~ 200, then by 2u q 3days Discussed increasing humalog to 10units with ISS 2/50/150 Discussed starting metformin for insulin sensitization, reviewed r/se/b Discussed ssx and mgt of hypoglycemia Discussed low carb diet Discussed ER precautions Patient educational handout given - POCT urinalysis dipstick - POCT glucose (manual entry) - Microalbumin / creatinine urine ratio  3. Essential hypertension, benign Controlled. Continue current regime.   Return in about 1 week (around 01/21/2019).    Rutherford Guys, MD [facility] at [facility] [place], [place] [address] Ph.  [phone] Fax [phone]

## 2019-01-14 NOTE — Patient Instructions (Addendum)
If you have lab work done today you will be contacted with your lab results within the next 2 weeks.  If you have not heard from [place] then please contact us. The fastest way to get your results is to register for My Chart.   IF you received an x-ray today, you will receive an invoice from [facility]. Please contact [facility] at [phone] with questions or concerns regarding your invoice.   IF you received labwork today, you will receive an invoice from [facility]. Please contact [facility] at [phone] with questions or concerns regarding your invoice.   Our billing staff will not be able to assist you with questions regarding bills from these companies.  You will be contacted with the lab results as soon as they are available. The fastest way to get your results is to activate your My Chart account. Instructions are located on the last page of this paperwork. If you have not heard from [place] regarding the results in 2 weeks, please contact this office.     Diabetes Mellitus and Nutrition, Adult When you have diabetes (diabetes mellitus), it is very important to have healthy eating habits because your blood sugar (glucose) levels are greatly affected by what you eat and drink. Eating healthy foods in the appropriate amounts, at about the same times every day, can help you:  Control your blood glucose.  Lower your risk of heart disease.  Improve your blood pressure.  Reach or maintain a healthy weight. Every person with diabetes is different, and each person has different needs for a meal plan. Your health care provider may recommend that you work with a diet and nutrition specialist (dietitian) to make a meal plan that is best for you. Your meal plan may vary depending on factors such as:  The calories you need.  The medicines you take.  Your weight.  Your blood glucose, blood pressure, and cholesterol levels.  Your activity level.  Other health conditions  you have, such as heart or kidney disease. How do carbohydrates affect me? Carbohydrates, also called carbs, affect your blood glucose level more than any other type of food. Eating carbs naturally raises the amount of glucose in your blood. Carb counting is a method for keeping track of how many carbs you eat. Counting carbs is important to keep your blood glucose at a healthy level, especially if you use insulin or take certain oral diabetes medicines. It is important to know how many carbs you can safely have in each meal. This is different for every person. Your dietitian can help you calculate how many carbs you should have at each meal and for each snack. Foods that contain carbs include:  Bread, cereal, rice, pasta, and crackers.  Potatoes and corn.  Peas, beans, and lentils.  Milk and yogurt.  Fruit and juice.  Desserts, such as cakes, cookies, ice cream, and candy. How does alcohol affect me? Alcohol can cause a sudden decrease in blood glucose (hypoglycemia), especially if you use insulin or take certain oral diabetes medicines. Hypoglycemia can be a life-threatening condition. Symptoms of hypoglycemia (sleepiness, dizziness, and confusion) are similar to symptoms of having too much alcohol. If your health care provider says that alcohol is safe for you, follow these guidelines:  Limit alcohol intake to no more than 1 drink per day for nonpregnant women and 2 drinks per day for men. One drink equals 12 oz of beer, 5 oz of wine, or 1 oz of hard liquor.  Do not drink on an empty stomach.  Keep yourself hydrated with water, diet soda, or unsweetened iced tea.  Keep in mind that regular soda, juice, and other mixers may contain a lot of sugar and must be counted as carbs. What are tips for following this plan?  Reading food labels  Start by checking the serving size on the "Nutrition Facts" label of packaged foods and drinks. The amount of calories, carbs, fats, and other  nutrients listed on the label is based on one serving of the item. Many items contain more than one serving per package.  Check the total grams (g) of carbs in one serving. You can calculate the number of servings of carbs in one serving by dividing the total carbs by 15. For example, if a food has 30 g of total carbs, it would be equal to 2 servings of carbs.  Check the number of grams (g) of saturated and trans fats in one serving. Choose foods that have low or no amount of these fats.  Check the number of milligrams (mg) of salt (sodium) in one serving. Most people should limit total sodium intake to less than 2,300 mg per day.  Always check the nutrition information of foods labeled as "low-fat" or "nonfat". These foods may be higher in added sugar or refined carbs and should be avoided.  Talk to your dietitian to identify your daily goals for nutrients listed on the label. Shopping  Avoid buying canned, premade, or processed foods. These foods tend to be high in fat, sodium, and added sugar.  Shop around the outside edge of the grocery store. This includes fresh fruits and vegetables, bulk grains, fresh meats, and fresh dairy. Cooking  Use low-heat cooking methods, such as baking, instead of high-heat cooking methods like deep frying.  Cook using healthy oils, such as olive, canola, or sunflower oil.  Avoid cooking with butter, cream, or high-fat meats. Meal planning  Eat meals and snacks regularly, preferably at the same times every day. Avoid going long periods of time without eating.  Eat foods high in fiber, such as fresh fruits, vegetables, beans, and whole grains. Talk to your dietitian about how many servings of carbs you can eat at each meal.  Eat 4-6 ounces (oz) of lean protein each day, such as lean meat, chicken, fish, eggs, or tofu. One oz of lean protein is equal to: ? 1 oz of meat, chicken, or fish. ? 1 egg. ?  cup of tofu.  Eat some foods each day that contain  healthy fats, such as avocado, nuts, seeds, and fish. Lifestyle  Check your blood glucose regularly.  Exercise regularly as told by your health care provider. This may include: ? 150 minutes of moderate-intensity or vigorous-intensity exercise each week. This could be brisk walking, biking, or water aerobics. ? Stretching and doing strength exercises, such as yoga or weightlifting, at least 2 times a week.  Take medicines as told by your health care provider.  Do not use any products that contain nicotine or tobacco, such as cigarettes and e-cigarettes. If you need help quitting, ask your health care provider.  Work with a Social worker or diabetes educator to identify strategies to manage stress and any emotional and social challenges. Questions to ask a health care provider  Do I need to meet with a diabetes educator?  Do I need to meet with a dietitian?  What number can I call if I have questions?  When are the best times to  check my blood glucose? Where to find more information:  American Diabetes Association: diabetes.org  Academy of Nutrition and Dietetics: www.eatright.[employer] of Diabetes and Digestive and Kidney Diseases (NIH): [employer] Summary  A healthy meal plan will help you control your blood glucose and maintain a healthy lifestyle.  Working with a diet and nutrition specialist (dietitian) can help you make a meal plan that is best for you.  Keep in mind that carbohydrates (carbs) and alcohol have immediate effects on your blood glucose levels. It is important to count carbs and to use alcohol carefully. This information is not intended to replace advice given to you by your health care provider. Make sure you discuss any questions you have with your health care provider. Document Released: 05/22/2005 Document Revised: 03/25/2017 Document Reviewed: 09/29/2016 Elsevier Interactive Patient Education  2019 [place].  Hypoglycemia Hypoglycemia occurs when the level of sugar (glucose) in the blood is too low. Hypoglycemia can happen in people who do or do not have diabetes. It can develop quickly, and it can be a medical emergency. For most people with diabetes, a blood glucose level below 70 mg/dL (3.9 mmol/L) is considered hypoglycemia. Glucose is a type of sugar that provides the body's main source of energy. Certain hormones (insulin and glucagon) control the level of glucose in the blood. Insulin lowers blood glucose, and glucagon raises blood glucose. Hypoglycemia can result from having too much insulin in the bloodstream, or from not eating enough food that contains glucose. You may also have reactive hypoglycemia, which happens within 4 hours after eating a meal. What are the causes? Hypoglycemia occurs most often in people who have diabetes and may be caused by:  Diabetes medicine.  Not eating enough, or not eating often enough.  Increased physical activity.  Drinking alcohol on an empty stomach. If you do not have diabetes, hypoglycemia may be caused by:  A tumor in the pancreas.  Not eating enough, or not eating for long periods at a time (fasting).  A severe infection or illness.  Certain medicines. What increases the risk? Hypoglycemia is more likely to develop in:  People who have diabetes and take medicines to lower blood glucose.  People who abuse alcohol.  People who have a severe illness. What are the signs or symptoms? Mild symptoms Mild hypoglycemia may not cause any symptoms. If you do have symptoms, they may include:  Hunger.  Anxiety.  Sweating and feeling clammy.  Dizziness or feeling light-headed.  Sleepiness.  Nausea.  Increased heart rate.  Headache.  Blurry vision.  Irritability.  Tingling or numbness around the mouth, lips, or tongue.  A change in coordination.  Restless sleep. Moderate symptoms Moderate hypoglycemia can  cause:  Mental confusion and poor judgment.  Behavior changes.  Weakness.  Irregular heartbeat. Severe symptoms Severe hypoglycemia is a medical emergency. It can cause:  Fainting.  Seizures.  Loss of consciousness (coma).  Death. How is this diagnosed? Hypoglycemia is diagnosed with a blood test to measure your blood glucose level. This blood test is done while you are having symptoms. Your health care provider may also do a physical exam and review your medical history. How is this treated? This condition can often be treated by immediately eating or drinking something that contains sugar, such as:  Fruit juice, 4-6 oz (120-150 mL).  Regular soda (not diet soda), 4-6 oz (120-150 mL).  Low-fat milk, 4 oz (120 mL).  Several pieces of hard candy.  Sugar or honey,  1 Tbsp (15 mL). Treating hypoglycemia if you have diabetes If you are alert and able to swallow safely, follow the 15:15 rule:  Take 15 grams of a rapid-acting carbohydrate. Talk with your health care provider about how much you should take.  Rapid-acting options include: ? Glucose pills (take 15 grams). ? 6-8 pieces of hard candy. ? 4-6 oz (120-150 mL) of fruit juice. ? 4-6 oz (120-150 mL) of regular (not diet) soda. ? 1 Tbsp (15 mL) honey or sugar.  Check your blood glucose 15 minutes after you take the carbohydrate.  If the repeat blood glucose level is still at or below 70 mg/dL (3.9 mmol/L), take 15 grams of a carbohydrate again.  If your blood glucose level does not increase above 70 mg/dL (3.9 mmol/L) after 3 tries, seek emergency medical care.  After your blood glucose level returns to normal, eat a meal or a snack within 1 hour.  Treating severe hypoglycemia Severe hypoglycemia is when your blood glucose level is at or below 54 mg/dL (3 mmol/L). Severe hypoglycemia is a medical emergency. Get medical help right away. If you have severe hypoglycemia and you cannot eat or drink, you may need an  injection of glucagon. A family member or close friend should learn how to check your blood glucose and how to give you a glucagon injection. Ask your health care provider if you need to have an emergency glucagon injection kit available. Severe hypoglycemia may need to be treated in a hospital. The treatment may include getting glucose through an IV. You may also need treatment for the cause of your hypoglycemia. Follow these instructions at home:  General instructions  Take over-the-counter and prescription medicines only as told by your health care provider.  Monitor your blood glucose as told by your health care provider.  Limit alcohol intake to no more than 1 drink a day for nonpregnant women and 2 drinks a day for men. One drink equals 12 oz of beer (355 mL), 5 oz of wine (148 mL), or 1 oz of hard liquor (44 mL).  Keep all follow-up visits as told by your health care provider. This is important. If you have diabetes:  Always have a rapid-acting carbohydrate snack with you to treat low blood glucose.  Follow your diabetes management plan as directed. Make sure you: ? Know the symptoms of hypoglycemia. It is important to treat it right away to prevent it from becoming severe. ? Take your medicines as directed. ? Follow your exercise plan. ? Follow your meal plan. Eat on time, and do not skip meals. ? Check your blood glucose as often as directed. Always check before and after exercise. ? Follow your sick day plan whenever you cannot eat or drink normally. Make this plan in advance with your health care provider.  Share your diabetes management plan with people in your workplace, school, and household.  Check your urine for ketones when you are ill and as told by your health care provider.  Carry a medical alert card or wear medical alert jewelry. Contact a health care provider if:  You have problems keeping your blood glucose in your target range.  You have frequent episodes  of hypoglycemia. Get help right away if:  You continue to have hypoglycemia symptoms after eating or drinking something containing glucose.  Your blood glucose is at or below 54 mg/dL (3 mmol/L).  You have a seizure.  You faint. These symptoms may represent a serious problem that is an  emergency. Do not wait to see if the symptoms will go away. Get medical help right away. Call your local emergency services (911 in the [place] Summary  Hypoglycemia occurs when the level of sugar (glucose) in the blood is too low.  Hypoglycemia can happen in people who do or do not have diabetes. It can develop quickly, and it can be a medical emergency.  Make sure you know the symptoms of hypoglycemia and how to treat it.  Always have a rapid-acting carbohydrate snack with you to treat low blood sugar. This information is not intended to replace advice given to you by your health care provider. Make sure you discuss any questions you have with your health care provider. Document Released: 08/25/2005 Document Revised: 02/16/2018 Document Reviewed: 09/28/2015 Elsevier Interactive Patient Education  2019 [employer].

## 2019-01-15 LAB — MICROALBUMIN / CREATININE URINE RATIO
Creatinine, Urine: 12 mg/dL
Microalb/Creat Ratio: <25 mg/g creat (ref 0–29)
Microalbumin, Urine: <3 ug/mL

## 2019-01-21 ENCOUNTER — Encounter: Payer: Self-pay | Admitting: Family Medicine

## 2019-01-21 ENCOUNTER — Other Ambulatory Visit: Payer: Self-pay

## 2019-01-21 ENCOUNTER — Ambulatory Visit (INDEPENDENT_AMBULATORY_CARE_PROVIDER_SITE_OTHER): Payer: Medicare Other | Admitting: Family Medicine

## 2019-01-21 VITALS — BP 116/73 | HR 73 | Temp 98.4°F | Ht 62.0 in | Wt 158.0 lb

## 2019-01-21 DIAGNOSIS — Z794 Long term (current) use of insulin: Secondary | ICD-10-CM | POA: Diagnosis not present

## 2019-01-21 DIAGNOSIS — E1165 Type 2 diabetes mellitus with hyperglycemia: Secondary | ICD-10-CM | POA: Diagnosis not present

## 2019-01-21 DIAGNOSIS — M791 Myalgia, unspecified site: Secondary | ICD-10-CM | POA: Diagnosis not present

## 2019-01-21 NOTE — Progress Notes (Signed)
5/15/202011:13 AM  Beverly Ramirez 1960-11-11, 58 y.o., female [id number]  Chief Complaint  Patient presents with  . Diabetes  . Generalized Body Aches    can not get comfortable without pain in the body when lying or sitting    HPI:   Patient is a 58 y.o. female with past medical history significant for ulcerative colitis, HLP, HTNwho presents today for new diagnosis of DM2 per a1c from march 2020 who presents for followup on hyperglycemia  Last OV a week ago Having high readings, discussed basaglar titration and changed novolin 3 to 10U + ISS 2/50/150 Started metformin  basaglar upto 30 units novolin R per sliding scale, avg 18 units a dose No lows  fastings today 228, cbg log for last week reviewed Last 3 days mostly in the high 200s She is really struggling with her diet, has no idea what she can and cant eat  Muscle pain, achy, arms > legs, not really back, chest or lower abd, hips No joint swelling or pain She does get occ rash on her hands, nails that she associates with UC flare up She is very fatigued - upcoming sleep study  Aug 2019 labs: Normal sed rate and crp Ck  229 Aldolase 11.8 LDH 253 ANA  Neg RF and CCP ab neg neg myositis panel referred to neuro but unable to contact patient     Fall Risk  01/21/2019 01/14/2019 12/27/2018 11/10/2018 09/24/2018  Falls in the past year? 0 0 0 0 0  Number falls in past yr: 0 0 0 0 -  Injury with Fall? 0 0 0 0 -  Follow up - - - Falls evaluation completed -     Depression screen [facility] 2/9 01/21/2019 12/27/2018 09/24/2018  Decreased Interest 0 0 0  Down, Depressed, Hopeless 0 0 0  PHQ - 2 Score 0 0 0    Allergies  Allergen Reactions  . Codeine Itching, Rash and Hives  . Latex Hives    With the power    Prior to Admission medications   Medication Sig Start Date End Date Taking? Authorizing Provider  amLODipine (NORVASC) 5 MG tablet Take 1 tablet (5 mg total) by mouth daily. 05/07/18   Rutherford Guys, MD   blood glucose meter kit and supplies KIT Based on patient's insurance. Check twice a day. Dx R73.9, Z79.52 09/24/18   Rutherford Guys, MD  fluticasone Asencion Islam) 50 MCG/ACT nasal spray Place 2 sprays into both nostrils daily. 12/23/18   Jacqlyn Larsen, PA-C  Insulin Glargine (LANTUS SOLOSTAR) 100 UNIT/ML Solostar Pen Inject 20 Units into the skin at bedtime. 11/11/18   LampteyMyrene Galas, MD  Insulin Syringe-Needle U-100 (INSULIN SYRINGE 1CC/31GX5/16") 31G X 5/16" 1 ML MISC Inject 0.08 ( 8 Units)  into the skin 3 times daily before meals. 10/16/18   Rutherford Guys, MD  Lancets [facility] DELICA PLUS [id number]) MISC USE TO CHECK BLOOD SUGAR TWICE A DAY 11/19/18   Rutherford Guys, MD  mesalamine (LIALDA) 1.2 g EC tablet Take 2 tablets (2.4 g total) by mouth 2 (two) times daily. 11/25/18 11/20/19  Gatha Mayer, MD  metFORMIN (GLUCOPHAGE) 500 MG tablet Take 1 tablet (500 mg total) by mouth twice daily for two weeks.  Then take 2 tablets (1,066m total) by mouth twice daily. 01/13/19   SRutherford Guys MD  NOVOLIN R 100 UNIT/ML injection INJECT 8 UNITS INTO THE SKIN 3 TIMES DAILY BEFORE MEALS. 12/26/18   SRutherford Guys MD  ONETOUCH VERIO test strip CHECK BLOOD SUGAR TWICE A DAY. 12/08/18   Rutherford Guys, MD  traMADol (ULTRAM) 50 MG tablet Take 1 tablet (50 mg total) by mouth every 8 (eight) hours as needed. 11/10/18   Rutherford Guys, MD    Past Medical History:  Diagnosis Date  . Anemia   . Carpal tunnel syndrome    bilateral  . Chronic heel pain   . DJD (degenerative joint disease) of cervical spine    MRI 2017  . Hyperlipidemia   . Plantar fasciitis    bilateral  . Tendonitis    left hand/wrist  . UC (ulcerative colitis) ([place])     Past Surgical History:  Procedure Laterality Date  . ABDOMINAL HYSTERECTOMY    . APPENDECTOMY    . CESAREAN SECTION     x4  . COLONOSCOPY     Multiple in [place]  . ESOPHAGOGASTRODUODENOSCOPY      Social History   Tobacco Use  . Smoking status:  Former Smoker    Types: Cigarettes    Last attempt to quit: 10/13/2006    Years since quitting: 12.2  . Smokeless tobacco: Never Used  Substance Use Topics  . Alcohol use: Yes    Comment: occasional    Family History  Problem Relation Age of Onset  . Stroke Mother   . Diabetes Mother   . Hypertension Mother   . Lung cancer Mother   . Sickle cell trait Mother   . Breast cancer Maternal Aunt   . Diabetes Maternal Aunt   . Lung cancer Maternal Aunt   . Lung cancer Maternal Aunt   . Lung cancer Maternal Aunt   . Colon cancer Cousin 65  . Sickle cell trait Father   . Sickle cell anemia Sister 24  . Lung cancer Maternal Grandmother   . Sickle cell anemia Sister   . Other Sister        accident and blood clot formed    ROS Per hpi  OBJECTIVE:  Today's Vitals   01/21/19 1104  BP: 116/73  Pulse: 73  Temp: 98.4 F (36.9 C)  TempSrc: Oral  SpO2: 100%  Weight: 158 lb (71.7 kg)  Height: _0  (1.575 m)   Body mass index is 28.9 kg/m.   Physical Exam Vitals signs and nursing note reviewed.  Constitutional:      Appearance: She is well-developed.  HENT:     Head: Normocephalic and atraumatic.  Eyes:     General: No scleral icterus.    Conjunctiva/sclera: Conjunctivae normal.     Pupils: Pupils are equal, round, and reactive to light.  Neck:     Musculoskeletal: Neck supple.  Pulmonary:     Effort: Pulmonary effort is normal.  Musculoskeletal:     Right shoulder: She exhibits tenderness (TTP over arms, forearms, thighs, no trigger points on back, chest wall, abd walll, hips). She exhibits normal range of motion, no bony tenderness and no swelling.  Skin:    General: Skin is warm and dry.  Neurological:     Mental Status: She is alert and oriented to person, place, and time.      ASSESSMENT and PLAN  1. Myalgia Very mild elevations of CK, aldolase and LDH in aug 2019, will repeat, if still elevated will refer to rheum for further eval and treatment. If  negative will do trial of gabapentin for possible fibromyalgia.  - CK - Aldolase - Lactate Dehydrogenase - Sedimentation Rate - C-reactive protein -  Comprehensive metabolic panel  2. Type 2 diabetes mellitus with hyperglycemia, with long-term current use of insulin (HCC) More than 50% of this 25 min visit was spent on education and coordination of care. Discussed at length plate method, carb counting, handout provided and phone apps recommended. Discussed continued titration of basaglar. Referring to endo for further eval and treatment. If not seeing CDE and endo office, then referral will be made at next visit - Ambulatory referral to Endocrinology  Return in about 4 weeks (around 02/18/2019).    Rutherford Guys, MD [facility] at [facility], [place] [address] Ph.  [phone] Fax [phone]

## 2019-01-21 NOTE — Patient Instructions (Signed)
° ° ° °  If you have lab work done today you will be contacted with your lab results within the next 2 weeks.  If you have not heard from us then please contact us. The fastest way to get your results is to register for My Chart. ° ° °IF you received an x-ray today, you will receive an invoice from [facility]. Please contact [facility] at [phone] with questions or concerns regarding your invoice.  ° °IF you received labwork today, you will receive an invoice from [facility]. Please contact [facility] at [phone] with questions or concerns regarding your invoice.  ° °Our billing staff will not be able to assist you with questions regarding bills from these companies. ° °You will be contacted with the lab results as soon as they are available. The fastest way to get your results is to activate your My Chart account. Instructions are located on the last page of this paperwork. If you have not heard from us regarding the results in 2 weeks, please contact this office. °  ° ° ° °

## 2019-01-24 ENCOUNTER — Other Ambulatory Visit: Payer: Self-pay

## 2019-01-24 ENCOUNTER — Other Ambulatory Visit: Payer: Self-pay | Admitting: Family Medicine

## 2019-01-24 ENCOUNTER — Telehealth: Payer: Self-pay

## 2019-01-24 DIAGNOSIS — E1165 Type 2 diabetes mellitus with hyperglycemia: Secondary | ICD-10-CM

## 2019-01-24 DIAGNOSIS — Z794 Long term (current) use of insulin: Secondary | ICD-10-CM

## 2019-01-24 LAB — SEDIMENTATION RATE: Sed Rate: 19 mm/hr (ref 0–40)

## 2019-01-24 LAB — COMPREHENSIVE METABOLIC PANEL
ALT: 25 IU/L (ref 0–32)
AST: 24 IU/L (ref 0–40)
Albumin/Globulin Ratio: 1.5 (ref 1.2–2.2)
Albumin: 3.7 g/dL — ABNORMAL LOW (ref 3.8–4.9)
Alkaline Phosphatase: 99 IU/L (ref 39–117)
BUN/Creatinine Ratio: 12 (ref 9–23)
BUN: 12 mg/dL (ref 6–24)
Bilirubin Total: 0.3 mg/dL (ref 0.0–1.2)
CO2: 22 mmol/L (ref 20–29)
Calcium: 9.4 mg/dL (ref 8.7–10.2)
Chloride: 102 mmol/L (ref 96–106)
Creatinine, Ser: 1.04 mg/dL — ABNORMAL HIGH (ref 0.57–1.00)
GFR calc Af Amer: 69 mL/min/{1.73_m2} (ref >59)
GFR calc non Af Amer: 60 mL/min/{1.73_m2} (ref >59)
Globulin, Total: 2.5 g/dL (ref 1.5–4.5)
Glucose: 314 mg/dL — ABNORMAL HIGH (ref 65–99)
Potassium: 4.4 mmol/L (ref 3.5–5.2)
Sodium: 138 mmol/L (ref 134–144)
Total Protein: 6.2 g/dL (ref 6.0–8.5)

## 2019-01-24 LAB — ALDOLASE: Aldolase: 7.9 U/L (ref 3.3–10.3)

## 2019-01-24 LAB — C-REACTIVE PROTEIN: CRP: 3 mg/L (ref 0–10)

## 2019-01-24 LAB — CK: Total CK: 164 U/L (ref 32–182)

## 2019-01-24 LAB — LACTATE DEHYDROGENASE: LDH: 226 IU/L (ref 119–226)

## 2019-01-24 MED ORDER — ONETOUCH DELICA PLUS LANCET33G MISC: 1.0000 | Freq: Three times a day (TID) | 11 refills | Status: AC

## 2019-01-24 MED ORDER — GABAPENTIN 300 MG PO CAPS: 300.0000 mg | ORAL_CAPSULE | Freq: Three times a day (TID) | ORAL | 3 refills | Status: DC

## 2019-01-24 NOTE — Progress Notes (Signed)
Sent message to the doctor so she could order the supplies correctly, also spoke to pt about results.

## 2019-01-24 NOTE — Addendum Note (Signed)
Addended by: Rutherford Guys on: 01/24/2019 01:20 PM   Modules accepted: Orders

## 2019-02-03 ENCOUNTER — Other Ambulatory Visit: Payer: Self-pay

## 2019-02-03 ENCOUNTER — Ambulatory Visit (INDEPENDENT_AMBULATORY_CARE_PROVIDER_SITE_OTHER): Payer: Medicare Other | Admitting: Family Medicine

## 2019-02-03 VITALS — BP 143/93 | Ht 62.0 in | Wt 158.0 lb

## 2019-02-03 DIAGNOSIS — Z0000 Encounter for general adult medical examination without abnormal findings: Secondary | ICD-10-CM | POA: Diagnosis not present

## 2019-02-03 NOTE — Progress Notes (Signed)
Presents today for [employer] Visit   Date of last exam: 01-21-2019  Interpreter used for this visit? No  I connected with  Beverly Ramirez on 02/03/19 by a telephone  and verified that I am speaking with the correct person using two identifiers.    The patient expressed understanding and agreed to proceed.   Patient Care Team: Rutherford Guys, MD as PCP - General (Family Medicine)   Other items to address today:  Discussed eye/dental yearly exams Going to call eye Doctor to schedule exam states she has seen some change in her vision over the last year Discussed immunizations    Other Screening: Last screening for diabetes: 11/10/2018 Last lipid screening: 5-11/2017  ADVANCE DIRECTIVES: Discussed: yes On File: no (copy requested) Materials Provided: no  Immunization status:  Immunization History  Administered Date(s) Administered  . Influenza,inj,Quad PF,6+ Mos 07/13/2017, 05/07/2018  . Pneumococcal Polysaccharide-23 11/10/2018  . Tdap 08/04/2017     There are no preventive care reminders to display for this patient.   Functional Status Survey: Is the patient deaf or have difficulty hearing?: No Does the patient have difficulty seeing, even when wearing glasses/contacts?: Yes(patient says made an appointment for eye check) Does the patient have difficulty concentrating, remembering, or making decisions?: No Does the patient have difficulty walking or climbing stairs?: No Does the patient have difficulty dressing or bathing?: No Does the patient have difficulty doing errands alone such as visiting a doctor's office or shopping?: No   6CIT Screen 02/03/2019  What Year? 0 points  What month? 0 points  What time? 0 points  Count back from 20 0 points  Months in reverse 0 points  Repeat phrase 0 points  Total Score 0        Clinical Support from 02/03/2019 in [facility]  AUDIT-C Score  2       Home Environment:    Lives in two story home with husband  No problem climbing stairs Yes scattered rugs (rugs have anchors to stop from slipping) No grab bars   Patient Active Problem List   Diagnosis Date Noted  . De Quervain's tenosynovitis, left 11/16/2018  . Bilateral carpal tunnel syndrome 04/13/2018  . Leukocytosis 10/04/2017  . Hyperlipidemia 08/04/2017  . Ulcerative colitis without complications ([place]) 40/37/0964  . Plantar fasciitis 05/28/2017     Past Medical History:  Diagnosis Date  . Anemia   . Carpal tunnel syndrome    bilateral  . Chronic heel pain   . DJD (degenerative joint disease) of cervical spine    MRI 2017  . Hyperlipidemia   . Plantar fasciitis    bilateral  . Tendonitis    left hand/wrist  . UC (ulcerative colitis) ([place])      Past Surgical History:  Procedure Laterality Date  . ABDOMINAL HYSTERECTOMY    . APPENDECTOMY    . CESAREAN SECTION     x4  . COLONOSCOPY     Multiple in [place]  . ESOPHAGOGASTRODUODENOSCOPY       Family History  Problem Relation Age of Onset  . Stroke Mother   . Diabetes Mother   . Hypertension Mother   . Lung cancer Mother   . Sickle cell trait Mother   . Breast cancer Maternal Aunt   . Diabetes Maternal Aunt   . Lung cancer Maternal Aunt   . Lung cancer Maternal Aunt   . Lung cancer Maternal Aunt   . Colon cancer Cousin 54  .  Sickle cell trait Father   . Sickle cell anemia Sister 22  . Lung cancer Maternal Grandmother   . Sickle cell anemia Sister   . Other Sister        accident and blood clot formed     Social History   Socioeconomic History  . Marital status: Married    Spouse name: Simona Huh  . Number of children: 4  . Years of education: Not on file  . Highest education level: Not on file  Occupational History  . Occupation: retired    Comment: He formerly worked for [employer], disabled due to a fall  Social Needs  . Financial resource strain: Not on file  . Food insecurity:     Worry: Not on file    Inability: Not on file  . Transportation needs:    Medical: Not on file    Non-medical: Not on file  Tobacco Use  . Smoking status: Former Smoker    Types: Cigarettes    Last attempt to quit: 10/13/2006    Years since quitting: 12.3  . Smokeless tobacco: Never Used  Substance and Sexual Activity  . Alcohol use: Yes    Comment: occasional  . Drug use: No  . Sexual activity: Yes    Partners: Male    Comment: 1ST intercourse- 7, partners- 86, married- 16 yrs   Lifestyle  . Physical activity:    Days per week: Not on file    Minutes per session: Not on file  . Stress: Not on file  Relationships  . Social connections:    Talks on phone: Not on file    Gets together: Not on file    Attends religious service: Not on file    Active member of club or organization: Not on file    Attends meetings of clubs or organizations: Not on file    Relationship status: Not on file  . Intimate partner violence:    Fear of current or ex partner: Not on file    Emotionally abused: Not on file    Physically abused: Not on file    Forced sexual activity: Not on file  Other Topics Concern  . Not on file  Social History Narrative   She is married, 3 sons born 1979, 1981, 1984.  One daughter born in 42.   She is medically retired from the [employer] after a fall and a hip injury, around 2004.   Caffeinated beverage every other day x1   07/13/2017     Allergies  Allergen Reactions  . Codeine Itching, Rash and Hives  . Latex Hives    With the power     Prior to Admission medications   Medication Sig Start Date End Date Taking? Authorizing Provider  amLODipine (NORVASC) 5 MG tablet Take 1 tablet (5 mg total) by mouth daily. 05/07/18  Yes Rutherford Guys, MD  blood glucose meter kit and supplies KIT Based on patient's insurance. Check twice a day. Dx R73.9, Z79.52 09/24/18  Yes Rutherford Guys, MD  fluticasone [facility]) 50 MCG/ACT nasal spray  Place 2 sprays into both nostrils daily. 12/23/18  Yes Jacqlyn Larsen, PA-C  Insulin Glargine (LANTUS SOLOSTAR) 100 UNIT/ML Solostar Pen Inject 20 Units into the skin at bedtime. 11/11/18  Yes Lamptey, Myrene Galas, MD  Insulin Syringe-Needle U-100 (INSULIN SYRINGE 1CC/31GX5/16") 31G X 5/16" 1 ML MISC Inject 0.08 ( 8 Units)  into the skin 3 times daily before  meals. 10/16/18  Yes Rutherford Guys, MD  Lancets (ONETOUCH DELICA PLUS UYZJQD64R) [place] 1 each by Does not apply route 3 (three) times daily. Dx E11.65, z79.4 01/24/19  Yes Rutherford Guys, MD  mesalamine (LIALDA) 1.2 g EC tablet Take 2 tablets (2.4 g total) by mouth 2 (two) times daily. 11/25/18 11/20/19 Yes Gatha Mayer, MD  metFORMIN (GLUCOPHAGE) 500 MG tablet Take 1 tablet (500 mg total) by mouth twice daily for two weeks.  Then take 2 tablets (1,059m total) by mouth twice daily. 01/13/19  Yes SRutherford Guys MD  NOVOLIN R 100 UNIT/ML injection INJECT 8 UNITS INTO THE SKIN 3 TIMES DAILY BEFORE MEALS. 12/26/18  Yes SRutherford Guys MD  [facility] strip CHECK BLOOD SUGAR TWICE A DAY. 12/08/18  Yes SRutherford Guys MD  gabapentin (NEURONTIN) 300 MG capsule Take 1 capsule (300 mg total) by mouth 3 (three) times daily. Patient not taking: Reported on 02/03/2019 01/24/19   SRutherford Guys MD  traMADol (ULTRAM) 50 MG tablet Take 1 tablet (50 mg total) by mouth every 8 (eight) hours as needed. Patient not taking: Reported on 02/03/2019 11/10/18   SRutherford Guys MD     [facility] 02/03/2019 01/21/2019 12/27/2018 09/24/2018 07/16/2018  Decreased Interest 0 0 0 0 0  Down, Depressed, Hopeless 0 0 0 0 0  PHQ - 2 Score 0 0 0 0 0     Fall Risk  02/03/2019 01/21/2019 01/14/2019 12/27/2018 11/10/2018  Falls in the past year? 0 0 0 0 0  Number falls in past yr: 0 0 0 0 0  Injury with Fall? 0 0 0 0 0  Follow up - - - - Falls evaluation completed      PHYSICAL EXAM: BP (!) 143/93 Comment: taken by patient at home  Ht 5' 2"  (1.575 m)   Wt  158 lb (71.7 kg)   BMI 28.90 kg/m    Wt Readings from Last 3 Encounters:  02/03/19 158 lb (71.7 kg)  01/21/19 158 lb (71.7 kg)  01/14/19 165 lb (74.8 kg)     No exam data present    Physical Exam   Education/Counseling provided regarding diet and exercise, prevention of chronic diseases, smoking/tobacco cessation, if applicable, and reviewed "Covered Medicare Preventive Services."   ASSESSMENT/PLAN: 1. Medicare annual wellness visit, subsequent

## 2019-02-03 NOTE — Patient Instructions (Signed)
Thank you for taking time to come for your Medicare Wellness Visit. I appreciate your ongoing commitment to your health goals. Please review the following plan we discussed and let me know if I can assist you in the future.  Julie Greer LPN  Healthy Eating Following a healthy eating pattern may help you to achieve and maintain a healthy body weight, reduce the risk of chronic disease, and live a long and productive life. It is important to follow a healthy eating pattern at an appropriate calorie level for your body. Your nutritional needs should be met primarily through food by choosing a variety of nutrient-rich foods. What are tips for following this plan? Reading food labels  Read labels and choose the following: ? Reduced or low sodium. ? Juices with 100% fruit juice. ? Foods with low saturated fats and high polyunsaturated and monounsaturated fats. ? Foods with whole grains, such as whole wheat, cracked wheat, brown rice, and wild rice. ? Whole grains that are fortified with folic acid. This is recommended for women who are pregnant or who want to become pregnant.  Read labels and avoid the following: ? Foods with a lot of added sugars. These include foods that contain brown sugar, corn sweetener, corn syrup, dextrose, fructose, glucose, high-fructose corn syrup, honey, invert sugar, lactose, malt syrup, maltose, molasses, raw sugar, sucrose, trehalose, or turbinado sugar.  Do not eat more than the following amounts of added sugar per day:  6 teaspoons (25 g) for women.  9 teaspoons (38 g) for men. ? Foods that contain processed or refined starches and grains. ? Refined grain products, such as white flour, degermed cornmeal, white bread, and white rice. Shopping  Choose nutrient-rich snacks, such as vegetables, whole fruits, and nuts. Avoid high-calorie and high-sugar snacks, such as potato chips, fruit snacks, and candy.  Use oil-based dressings and spreads on foods instead of  solid fats such as butter, stick margarine, or cream cheese.  Limit pre-made sauces, mixes, and "instant" products such as flavored rice, instant noodles, and ready-made pasta.  Try more plant-protein sources, such as tofu, tempeh, black beans, edamame, lentils, nuts, and seeds.  Explore eating plans such as the Mediterranean diet or vegetarian diet. Cooking  Use oil to saut or stir-fry foods instead of solid fats such as butter, stick margarine, or lard.  Try baking, boiling, grilling, or broiling instead of frying.  Remove the fatty part of meats before cooking.  Steam vegetables in water or broth. Meal planning   At meals, imagine dividing your plate into fourths: ? One-half of your plate is fruits and vegetables. ? One-fourth of your plate is whole grains. ? One-fourth of your plate is protein, especially lean meats, poultry, eggs, tofu, beans, or nuts.  Include low-fat dairy as part of your daily diet. Lifestyle  Choose healthy options in all settings, including home, work, school, restaurants, or stores.  Prepare your food safely: ? Wash your hands after handling raw meats. ? Keep food preparation surfaces clean by regularly washing with hot, soapy water. ? Keep raw meats separate from ready-to-eat foods, such as fruits and vegetables. ? Cook seafood, meat, poultry, and eggs to the recommended internal temperature. ? Store foods at safe temperatures. In general:  Keep cold foods at 40F (4.4C) or below.  Keep hot foods at 140F (60C) or above.  Keep your freezer at 0F (-17.8C) or below.  Foods are no longer safe to eat when they have been between the temperatures of 40-140F (4.4-60C) for   more than 2 hours. What foods should I eat? Fruits Aim to eat 2 cup-equivalents of fresh, canned (in natural juice), or frozen fruits each day. Examples of 1 cup-equivalent of fruit include 1 small apple, 8 large strawberries, 1 cup canned fruit,  cup dried fruit, or 1 cup  100% juice. Vegetables Aim to eat 2-3 cup-equivalents of fresh and frozen vegetables each day, including different varieties and colors. Examples of 1 cup-equivalent of vegetables include 2 medium carrots, 2 cups raw, leafy greens, 1 cup chopped vegetable (raw or cooked), or 1 medium baked potato. Grains Aim to eat 6 ounce-equivalents of whole grains each day. Examples of 1 ounce-equivalent of grains include 1 slice of bread, 1 cup ready-to-eat cereal, 3 cups popcorn, or  cup cooked rice, pasta, or cereal. Meats and other proteins Aim to eat 5-6 ounce-equivalents of protein each day. Examples of 1 ounce-equivalent of protein include 1 egg, 1/2 cup nuts or seeds, or 1 tablespoon (16 g) peanut butter. A cut of meat or fish that is the size of a deck of cards is about 3-4 ounce-equivalents.  Of the protein you eat each week, try to have at least 8 ounces come from seafood. This includes salmon, trout, herring, and anchovies. Dairy Aim to eat 3 cup-equivalents of fat-free or low-fat dairy each day. Examples of 1 cup-equivalent of dairy include 1 cup (240 mL) milk, 8 ounces (250 g) yogurt, 1 ounces (44 g) natural cheese, or 1 cup (240 mL) fortified soy milk. Fats and oils  Aim for about 5 teaspoons (21 g) per day. Choose monounsaturated fats, such as canola and olive oils, avocados, peanut butter, and most nuts, or polyunsaturated fats, such as sunflower, corn, and soybean oils, walnuts, pine nuts, sesame seeds, sunflower seeds, and flaxseed. Beverages  Aim for six 8-oz glasses of water per day. Limit coffee to three to five 8-oz cups per day.  Limit caffeinated beverages that have added calories, such as soda and energy drinks.  Limit alcohol intake to no more than 1 drink a day for nonpregnant women and 2 drinks a day for men. One drink equals 12 oz of beer (355 mL), 5 oz of wine (148 mL), or 1 oz of hard liquor (44 mL). Seasoning and other foods  Avoid adding excess amounts of salt to your  foods. Try flavoring foods with herbs and spices instead of salt.  Avoid adding sugar to foods.  Try using oil-based dressings, sauces, and spreads instead of solid fats. This information is based on general [place] nutrition guidelines. For more information, visit choosemyplate.gov. Exact amounts may vary based on your nutrition needs. Summary  A healthy eating plan may help you to maintain a healthy weight, reduce the risk of chronic diseases, and stay active throughout your life.  Plan your meals. Make sure you eat the right portions of a variety of nutrient-rich foods.  Try baking, boiling, grilling, or broiling instead of frying.  Choose healthy options in all settings, including home, work, school, restaurants, or stores. This information is not intended to replace advice given to you by your health care provider. Make sure you discuss any questions you have with your health care provider. Document Released: 12/07/2017 Document Revised: 12/07/2017 Document Reviewed: 12/07/2017 Elsevier Interactive Patient Education  2019 Elsevier Inc.  

## 2019-02-05 ENCOUNTER — Other Ambulatory Visit: Payer: Self-pay | Admitting: Family Medicine

## 2019-02-05 NOTE — Telephone Encounter (Signed)
Refill request for metformin / Previously written:  Disp Refills Start End   metFORMIN (GLUCOPHAGE) 500 MG tablet 92 tablet 0 01/13/2019    Sig: Take 1 tablet (500 mg total) by mouth twice daily for two weeks. Then take 2 tablets (1,029m total) by mouth twice daily.   Sent to pharmacy as: metFORMIN (GLUCOPHAGE) 500 MG tablet    Should patient get refill for 2 tabs twice a day for 90 days=360 tabs? / If so please have provider send in refills

## 2019-02-15 ENCOUNTER — Other Ambulatory Visit: Payer: Self-pay | Admitting: Family Medicine

## 2019-02-15 NOTE — Telephone Encounter (Signed)
Ok to change to #90

## 2019-02-16 NOTE — Telephone Encounter (Signed)
Pt is requesting medication 

## 2019-02-21 ENCOUNTER — Other Ambulatory Visit: Payer: Self-pay | Admitting: Family Medicine

## 2019-02-21 ENCOUNTER — Ambulatory Visit: Payer: Medicare Other | Admitting: Family Medicine

## 2019-02-21 NOTE — Telephone Encounter (Signed)
Please advise unsure why pt was only given #60

## 2019-02-22 ENCOUNTER — Other Ambulatory Visit: Payer: Self-pay | Admitting: *Deleted

## 2019-02-22 MED ORDER — METFORMIN HCL 500 MG PO TABS: ORAL_TABLET | ORAL | 2 refills | Status: DC

## 2019-02-22 NOTE — Progress Notes (Signed)
Medication reordered due to failed transmission to pharmacy on 02/21/19.

## 2019-03-02 ENCOUNTER — Encounter: Payer: Self-pay | Admitting: Internal Medicine

## 2019-03-08 ENCOUNTER — Telehealth: Payer: Self-pay | Admitting: Family Medicine

## 2019-03-08 ENCOUNTER — Other Ambulatory Visit: Payer: Self-pay

## 2019-03-08 ENCOUNTER — Encounter: Payer: Self-pay | Admitting: Family Medicine

## 2019-03-08 ENCOUNTER — Ambulatory Visit (INDEPENDENT_AMBULATORY_CARE_PROVIDER_SITE_OTHER): Payer: Medicare Other | Admitting: Family Medicine

## 2019-03-08 ENCOUNTER — Ambulatory Visit: Payer: Medicare Other | Admitting: Family Medicine

## 2019-03-08 ENCOUNTER — Telehealth: Payer: Self-pay | Admitting: *Deleted

## 2019-03-08 VITALS — BP 130/86 | HR 100 | Temp 99.4°F | Resp 16 | Ht 62.0 in | Wt 156.0 lb

## 2019-03-08 DIAGNOSIS — Z20822 Contact with and (suspected) exposure to covid-19: Secondary | ICD-10-CM

## 2019-03-08 DIAGNOSIS — K5190 Ulcerative colitis, unspecified, without complications: Secondary | ICD-10-CM

## 2019-03-08 DIAGNOSIS — R6889 Other general symptoms and signs: Secondary | ICD-10-CM

## 2019-03-08 DIAGNOSIS — R059 Cough, unspecified: Secondary | ICD-10-CM

## 2019-03-08 DIAGNOSIS — R05 Cough: Secondary | ICD-10-CM | POA: Diagnosis not present

## 2019-03-08 DIAGNOSIS — J029 Acute pharyngitis, unspecified: Secondary | ICD-10-CM | POA: Diagnosis not present

## 2019-03-08 NOTE — Telephone Encounter (Signed)
Patient scheduled for tomorrow at [facility] at 12:45 pm.

## 2019-03-08 NOTE — Telephone Encounter (Signed)
-----   Message from Forrest Moron, MD sent at 03/08/2019  3:50 PM EDT ----- This patient has low grade temps, sore throat, new cough, diarrhea, and fatigue. I would like her to be tested for COVID19

## 2019-03-08 NOTE — Telephone Encounter (Signed)
Patient was seen in the office today for diarrhea, cough, sore throat, elevated temps She needs testing for COVID19. Please arrange.

## 2019-03-08 NOTE — Progress Notes (Signed)
 Established Patient Office Visit  Subjective:  Patient ID: Beverly Ramirez, female    DOB: 08/25/1961  Age: 58 y.o. MRN: [id number]  CC:  Chief Complaint  Patient presents with  . Ulcerative Colitis    onset: last wednesday, diarrhea x 3 day, and emesis has started yesterday and today.  Taking benadryl for allergies. Per pt inside her mouth is burning and sore throat. throat pain 8/10 and coughing x 2-3 days    HPI Beverly Ramirez presents for   Pt reports that she has a history of Ulcerative Colitis and is on Mesalamine She sees Dr. Gessner for Gastroenterology She started having mouth ulcer, pain and diarrhea  She gets flares about monthly She states that everytime she has a UC flare she is put on high dose prednisone and this makes her sugars go very high and she feels weird.  She states that she also had emesis, with the 3 days of diarrhea, sore throat and throat pain as well as 2-3 days of coughing She denies fevers She has a college age daughter but the patient has been staying home She took some benadryl for allergies because of pruritus She states hat she has diabetes and is on lantus 20 u and novolin R 8 units TID plus metformin She states that she takes her shot at night and meal time insulin with her metformin    Past Medical History:  Diagnosis Date  . Anemia   . Carpal tunnel syndrome    bilateral  . Chronic heel pain   . DJD (degenerative joint disease) of cervical spine    MRI 2017  . Hyperlipidemia   . Plantar fasciitis    bilateral  . Tendonitis    left hand/wrist  . UC (ulcerative colitis) (HCC)     Past Surgical History:  Procedure Laterality Date  . ABDOMINAL HYSTERECTOMY    . APPENDECTOMY    . CESAREAN SECTION     x4  . COLONOSCOPY     Multiple in [place]  . ESOPHAGOGASTRODUODENOSCOPY      Family History  Problem Relation Age of Onset  . Stroke Mother   . Diabetes Mother   . Hypertension Mother   . Lung cancer Mother   .  Sickle cell trait Mother   . Breast cancer Maternal Aunt   . Diabetes Maternal Aunt   . Lung cancer Maternal Aunt   . Lung cancer Maternal Aunt   . Lung cancer Maternal Aunt   . Colon cancer Cousin 30  . Sickle cell trait Father   . Sickle cell anemia Sister 32  . Lung cancer Maternal Grandmother   . Sickle cell anemia Sister   . Other Sister        accident and blood clot formed    Social History   Socioeconomic History  . Marital status: Married    Spouse name: Dennis  . Number of children: 4  . Years of education: Not on file  . Highest education level: Not on file  Occupational History  . Occupation: retired    Comment: He formerly worked for [employer], disabled due to a fall  Social Needs  . Financial resource strain: Not on file  . Food insecurity    Worry: Not on file    Inability: Not on file  . Transportation needs    Medical: Not on file    Non-medical: Not on file  Tobacco Use  . Smoking status: Former Smoker      Types: Cigarettes    Quit date: 10/13/2006    Years since quitting: 12.5  . Smokeless tobacco: Never Used  Substance and Sexual Activity  . Alcohol use: Yes    Comment: occasional  . Drug use: No  . Sexual activity: Yes    Partners: Male    Comment: 1ST intercourse- 70, partners- 76, married- 16 yrs   Lifestyle  . Physical activity    Days per week: Not on file    Minutes per session: Not on file  . Stress: Not on file  Relationships  . Social Herbalist on phone: Not on file    Gets together: Not on file    Attends religious service: Not on file    Active member of club or organization: Not on file    Attends meetings of clubs or organizations: Not on file    Relationship status: Not on file  . Intimate partner violence    Fear of current or ex partner: Not on file    Emotionally abused: Not on file    Physically abused: Not on file    Forced sexual activity: Not on file  Other Topics Concern  . Not  on file  Social History Narrative   She is married, 3 sons born 1979, 1981, 1984.  One daughter born in 51.   She is medically retired from the [employer] after a fall and a hip injury, around 2004.   Caffeinated beverage every other day x1   07/13/2017    Outpatient Medications Prior to Visit  Medication Sig Dispense Refill  . amLODipine (NORVASC) 5 MG tablet Take 1 tablet (5 mg total) by mouth daily. 90 tablet 3  . blood glucose meter kit and supplies KIT Based on patient's insurance. Check twice a day. Dx R73.9, Z79.52 1 each 0  . fluticasone (FLONASE) 50 MCG/ACT nasal spray Place 2 sprays into both nostrils daily. 16 g 0  . Insulin Glargine (LANTUS SOLOSTAR) 100 UNIT/ML Solostar Pen Inject 20 Units into the skin at bedtime. 5 pen 0  . Insulin Syringe-Needle U-100 (INSULIN SYRINGE 1CC/31GX5/16") 31G X 5/16" 1 ML MISC Inject 0.08 ( 8 Units)  into the skin 3 times daily before meals. 100 each 3  . Lancets (ONETOUCH DELICA PLUS NIDPOE42P) MISC 1 each by Does not apply route 3 (three) times daily. Dx E11.65, z79.4 100 each 11  . mesalamine (LIALDA) 1.2 g EC tablet Take 2 tablets (2.4 g total) by mouth 2 (two) times daily. 360 tablet 3  . metFORMIN (GLUCOPHAGE) 500 MG tablet TAKE 1 TABLET BY MOUTH TWICE DAILY FOR TWO WEEKS. THEN TAKE 2 TABLETS BY MOUTH TWICE DAILY. 360 tablet 2  . ONETOUCH VERIO test strip CHECK BLOOD SUGAR TWICE A DAY. 100 each 1  . NOVOLIN R 100 UNIT/ML injection INJECT 8 UNITS INTO THE SKIN 3 TIMES DAILY BEFORE MEALS. 30 mL 0  . gabapentin (NEURONTIN) 300 MG capsule TAKE 1 CAPSULE BY MOUTH THREE TIMES A DAY 270 capsule 2  . traMADol (ULTRAM) 50 MG tablet Take 1 tablet (50 mg total) by mouth every 8 (eight) hours as needed. (Patient not taking: Reported on 02/03/2019) 30 tablet 0   No facility-administered medications prior to visit.     Allergies  Allergen Reactions  . Codeine Itching, Rash and Hives  . Latex Hives    With the power    ROS  Review of Systems See  hpi   Objective:  Physical Exam  BP 130/86 (BP Location: Right Arm, Patient Position: Sitting, Cuff Size: Normal)   Pulse 100   Temp 99.4 F (37.4 C) (Oral)   Resp 16   Ht 5' 2" (1.575 m)   Wt 156 lb (70.8 kg)   SpO2 97%   BMI 28.53 kg/m  Wt Readings from Last 3 Encounters:  04/26/19 156 lb (70.8 kg)  03/25/19 160 lb (72.6 kg)  03/08/19 156 lb (70.8 kg)   Physical Exam  Constitutional: Oriented to person, place, and time. Appears well-developed and well-nourished.  HENT:  Head: Normocephalic and atraumatic.  Eyes: Conjunctivae and EOM are normal.  Cardiovascular: Normal rate, regular rhythm, normal heart sounds and intact distal pulses.  No murmur heard. Pulmonary/Chest: Effort normal and breath sounds normal. No stridor. No respiratory distress. Has no wheezes.  Neurological: Is alert and oriented to person, place, and time.  Skin: Skin is warm. Capillary refill takes less than 2 seconds.  Psychiatric: Has a normal mood and affect. Behavior is normal. Judgment and thought content normal.    Health Maintenance Due  Topic Date Due  . INFLUENZA VACCINE  04/09/2019    There are no preventive care reminders to display for this patient.  Lab Results  Component Value Date   TSH 0.902 09/23/2017   Lab Results  Component Value Date   WBC 11.6 (H) 03/25/2019   HGB 11.4 03/25/2019   HCT 34.6 03/25/2019   MCV 86 03/25/2019   PLT 422 03/25/2019   Lab Results  Component Value Date   NA 141 03/25/2019   K 3.9 03/25/2019   CO2 25 03/25/2019   GLUCOSE 87 03/25/2019   BUN 14 03/25/2019   CREATININE 0.79 03/25/2019   BILITOT 0.2 03/25/2019   ALKPHOS 113 03/25/2019   AST 30 03/25/2019   ALT 37 (H) 03/25/2019   PROT 6.5 03/25/2019   ALBUMIN 3.8 03/25/2019   CALCIUM 9.6 03/25/2019   ANIONGAP 11 01/12/2019   GFR 87.06 09/23/2018   Lab Results  Component Value Date   CHOL 223 (H) 01/08/2018   Lab Results  Component Value Date   HDL 35  (L) 01/08/2018   Lab Results  Component Value Date   LDLCALC 151 (H) 01/08/2018   Lab Results  Component Value Date   TRIG 229 (H) 01/08/2018   Lab Results  Component Value Date   CHOLHDL 6.4 (H) 01/08/2018   Lab Results  Component Value Date   HGBA1C 7.6 (H) 03/25/2019      Assessment & Plan:   Problem List Items Addressed This Visit      Digestive   Ulcerative colitis without complications (HCC) - Primary Call her Gastroenterologist to discuss if she is in a flare    Other Visit Diagnoses    Cough       Sore throat       Suspected Covid-19 Virus Infection    -  Advised to monitor her sore throat symptoms and if there are any changes or worsening to call for testing      No orders of the defined types were placed in this encounter.   Follow-up: No follow-ups on file.   A total of 25 minutes were spent face-to-face with the patient during this encounter and over half of that time was spent on counseling and coordination of care.    A , MD 

## 2019-03-08 NOTE — Patient Instructions (Addendum)
If you have lab work done today you will be contacted with your lab results within the next 2 weeks.  If you have not heard from [place] then please contact us. The fastest way to get your results is to register for My Chart.   IF you received an x-ray today, you will receive an invoice from [facility]. Please contact [facility] at [phone] with questions or concerns regarding your invoice.   IF you received labwork today, you will receive an invoice from [facility]. Please contact [facility] at [phone] with questions or concerns regarding your invoice.   Our billing staff will not be able to assist you with questions regarding bills from these companies.  You will be contacted with the lab results as soon as they are available. The fastest way to get your results is to activate your My Chart account. Instructions are located on the last page of this paperwork. If you have not heard from [place] regarding the results in 2 weeks, please contact this office.       Person Under Monitoring Name: Beverly Ramirez  Location: [place] [address]   Infection Prevention Recommendations for Individuals Confirmed to have, or Being Evaluated for, 2019 Novel Coronavirus (COVID-19) Infection Who Receive Care at Home  Individuals who are confirmed to have, or are being evaluated for, COVID-19 should follow the prevention steps below until a healthcare provider or local or state health department says they can return to normal activities.  Stay home except to get medical care You should restrict activities outside your home, except for getting medical care. Do not go to work, school, or public areas, and do not use public transportation or taxis.  Call ahead before visiting your doctor Before your medical appointment, call the healthcare provider and tell them that you have, or are being evaluated for, COVID-19 infection. This will help the healthcare  provider's office take steps to keep other people from getting infected. Ask your healthcare provider to call the local or state health department.  Monitor your symptoms Seek prompt medical attention if your illness is worsening (e.g., difficulty breathing). Before going to your medical appointment, call the healthcare provider and tell them that you have, or are being evaluated for, COVID-19 infection. Ask your healthcare provider to call the local or state health department.  Wear a facemask You should wear a facemask that covers your nose and mouth when you are in the same room with other people and when you visit a healthcare provider. People who live with or visit you should also wear a facemask while they are in the same room with you.  Separate yourself from other people in your home As much as possible, you should stay in a different room from other people in your home. Also, you should use a separate bathroom, if available.  Avoid sharing household items You should not share dishes, drinking glasses, cups, eating utensils, towels, bedding, or other items with other people in your home. After using these items, you should wash them thoroughly with soap and water.  Cover your coughs and sneezes Cover your mouth and nose with a tissue when you cough or sneeze, or you can cough or sneeze into your sleeve. Throw used tissues in a lined trash can, and immediately wash your hands with soap and water for at least 20 seconds or use an alcohol-based hand rub.  Wash your [facility] your hands often and thoroughly with soap and water  for at least 20 seconds. You can use an alcohol-based hand sanitizer if soap and water are not available and if your hands are not visibly dirty. Avoid touching your eyes, nose, and mouth with unwashed hands.   Prevention Steps for Caregivers and Household Members of Individuals Confirmed to have, or Being Evaluated for, COVID-19 Infection Being Cared for  in the Home  If you live with, or provide care at home for, a person confirmed to have, or being evaluated for, COVID-19 infection please follow these guidelines to prevent infection:  Follow healthcare provider's instructions Make sure that you understand and can help the patient follow any healthcare provider instructions for all care.  Provide for the patient's basic needs You should help the patient with basic needs in the home and provide support for getting groceries, prescriptions, and other personal needs.  Monitor the patient's symptoms If they are getting sicker, call his or her medical provider and tell them that the patient has, or is being evaluated for, COVID-19 infection. This will help the healthcare provider's office take steps to keep other people from getting infected. Ask the healthcare provider to call the local or state health department.  Limit the number of people who have contact with the patient  If possible, have only one caregiver for the patient.  Other household members should stay in another home or place of residence. If this is not possible, they should stay  in another room, or be separated from the patient as much as possible. Use a separate bathroom, if available.  Restrict visitors who do not have an essential need to be in the home.  Keep older adults, very young children, and other sick people away from the patient Keep older adults, very young children, and those who have compromised immune systems or chronic health conditions away from the patient. This includes people with chronic heart, lung, or kidney conditions, diabetes, and cancer.  Ensure good ventilation Make sure that shared spaces in the home have good air flow, such as from an air conditioner or an opened window, weather permitting.  Wash your hands often  Wash your hands often and thoroughly with soap and water for at least 20 seconds. You can use an alcohol based hand sanitizer  if soap and water are not available and if your hands are not visibly dirty.  Avoid touching your eyes, nose, and mouth with unwashed hands.  Use disposable paper towels to dry your hands. If not available, use dedicated cloth towels and replace them when they become wet.  Wear a facemask and gloves  Wear a disposable facemask at all times in the room and gloves when you touch or have contact with the patient's blood, body fluids, and/or secretions or excretions, such as sweat, saliva, sputum, nasal mucus, vomit, urine, or feces.  Ensure the mask fits over your nose and mouth tightly, and do not touch it during use.  Throw out disposable facemasks and gloves after using them. Do not reuse.  Wash your hands immediately after removing your facemask and gloves.  If your personal clothing becomes contaminated, carefully remove clothing and launder. Wash your hands after handling contaminated clothing.  Place all used disposable facemasks, gloves, and other waste in a lined container before disposing them with other household waste.  Remove gloves and wash your hands immediately after handling these items.  Do not share dishes, glasses, or other household items with the patient  Avoid sharing household items. You should not share dishes,  drinking glasses, cups, eating utensils, towels, bedding, or other items with a patient who is confirmed to have, or being evaluated for, COVID-19 infection.  After the person uses these items, you should wash them thoroughly with soap and water.  Wash laundry thoroughly  Immediately remove and wash clothes or bedding that have blood, body fluids, and/or secretions or excretions, such as sweat, saliva, sputum, nasal mucus, vomit, urine, or feces, on them.  Wear gloves when handling laundry from the patient.  Read and follow directions on labels of laundry or clothing items and detergent. In general, wash and dry with the warmest temperatures recommended on  the label.  Clean all areas the individual has used often  Clean all touchable surfaces, such as counters, tabletops, doorknobs, bathroom fixtures, toilets, phones, keyboards, tablets, and bedside tables, every day. Also, clean any surfaces that may have blood, body fluids, and/or secretions or excretions on them.  Wear gloves when cleaning surfaces the patient has come in contact with.  Use a diluted bleach solution (e.g., dilute bleach with 1 part bleach and 10 parts water) or a household disinfectant with a label that says EPA-registered for coronaviruses. To make a bleach solution at home, add 1 tablespoon of bleach to 1 quart (4 cups) of water. For a larger supply, add  cup of bleach to 1 gallon (16 cups) of water.  Read labels of cleaning products and follow recommendations provided on product labels. Labels contain instructions for safe and effective use of the cleaning product including precautions you should take when applying the product, such as wearing gloves or eye protection and making sure you have good ventilation during use of the product.  Remove gloves and wash hands immediately after cleaning.  Monitor yourself for signs and symptoms of illness Caregivers and household members are considered close contacts, should monitor their health, and will be asked to limit movement outside of the home to the extent possible. Follow the monitoring steps for close contacts listed on the symptom monitoring form.   ? If you have additional questions, contact your local health department or call the epidemiologist on call at [phone] (available 24/7). ? This guidance is subject to change. For the most up-to-date guidance from [facility], please refer to their website: YouBlogs.pl

## 2019-03-08 NOTE — Telephone Encounter (Signed)
Scheduled patient for COVID 19 test tomorrow at 12:45 pm at [facility].  Testing protocol reviewed.

## 2019-03-09 ENCOUNTER — Other Ambulatory Visit: Payer: PRIVATE HEALTH INSURANCE

## 2019-03-09 DIAGNOSIS — R6889 Other general symptoms and signs: Secondary | ICD-10-CM | POA: Diagnosis not present

## 2019-03-09 DIAGNOSIS — Z20822 Contact with and (suspected) exposure to covid-19: Secondary | ICD-10-CM

## 2019-03-10 NOTE — Progress Notes (Signed)
This encounter was created in error - please disregard.

## 2019-03-14 ENCOUNTER — Encounter: Payer: Self-pay | Admitting: Family Medicine

## 2019-03-15 ENCOUNTER — Telehealth: Payer: Self-pay

## 2019-03-15 ENCOUNTER — Telehealth: Payer: Self-pay | Admitting: Internal Medicine

## 2019-03-15 LAB — NOVEL CORONAVIRUS, NAA: SARS-CoV-2, NAA: NOT DETECTED

## 2019-03-15 NOTE — Telephone Encounter (Signed)
Left message for patient to call back I did leave her a message since it is late in the pm that she needs to go to ED for eval.  She is asked to call me back and confirm she received my instructions.

## 2019-03-15 NOTE — Telephone Encounter (Signed)
Pt called for covid 19 results. Pt informed results are not back. Pt verbalized understanding.

## 2019-03-15 NOTE — Telephone Encounter (Signed)
Patient reports that she has has 2 week hx of diarrhea.that has gotten progressively worse.  Now she is having severe urgency and watery diarrhea. No blood in stool. Her hands are peeling, she has large sores in her mouth,  She is not tolerating solid foods at all.  Vomiting after all solids.  She is able to tolerate liquids.   She is maintained on mesalamine 2.4 Gm BID. Her Covid test from 03/09/19 is still pending.  When she saw her PCP she had fever, chest tightness, and diarrhea.  Dr. Carlean Purl .  She no longer has fever.  She is a diabetic, but her BS are around 120 she is still taking her insulin.

## 2019-03-15 NOTE — Telephone Encounter (Signed)
Pt states that she has been having colitis flare up for two weeks, she went to her PCP who told her that she had also Covid-19 sxs and sent her for Covid-19 test. Pt has not received results yet but she states that colitis sxs are worsening so she wants to know if she can take prednisone or something else or if she should wait for Covid-19 results. Pls call her.

## 2019-03-15 NOTE — Telephone Encounter (Signed)
Patient notified.  She will go to the ED now

## 2019-03-15 NOTE — Telephone Encounter (Signed)
I recommend that she be evaluated in ED Suggest Beverly Ramirez

## 2019-03-19 ENCOUNTER — Other Ambulatory Visit: Payer: Self-pay | Admitting: Family Medicine

## 2019-03-20 NOTE — Telephone Encounter (Signed)
Requested Prescriptions  Pending Prescriptions Disp Refills  . NOVOLIN R 100 UNIT/ML injection [Pharmacy Med Name: NOVOLIN R 100 UNIT/ML VIAL] 30 mL 0    Sig: INJECT 8 UNITS INTO THE SKIN 3 TIMES DAILY BEFORE MEALS.     Endocrinology:  Diabetes - Insulins Failed - 03/19/2019 12:32 PM      Failed - HBA1C is between 0 and 7.9 and within 180 days    Hemoglobin A1C  Date Value Ref Range Status  11/10/2018 9.5 (A) 4.0 - 5.6 % Final         Passed - Valid encounter within last 6 months    Recent Outpatient Visits          1 week ago Ulcerative colitis without complications, unspecified location [facility])   [facility] at [facility], Arlie Solomons, MD   1 month ago Medicare annual wellness visit, subsequent   [facility], Lilia Argue, MD   1 month ago Myalgia   [facility], Lilia Argue, MD   2 months ago Type 2 diabetes mellitus with hyperglycemia, with long-term current use of insulin [facility])   [facility], Lilia Argue, MD   2 months ago Acute right flank pain   [facility], Lilia Argue, MD      Future Appointments            In 5 days Rutherford Guys, MD Primary Care at [place], [facility]

## 2019-03-25 ENCOUNTER — Other Ambulatory Visit: Payer: Self-pay

## 2019-03-25 ENCOUNTER — Encounter: Payer: Self-pay | Admitting: Family Medicine

## 2019-03-25 ENCOUNTER — Ambulatory Visit (INDEPENDENT_AMBULATORY_CARE_PROVIDER_SITE_OTHER): Payer: Medicare Other | Admitting: Family Medicine

## 2019-03-25 VITALS — BP 135/86 | HR 78 | Temp 98.7°F | Ht 62.0 in | Wt 160.0 lb

## 2019-03-25 DIAGNOSIS — Z794 Long term (current) use of insulin: Secondary | ICD-10-CM | POA: Diagnosis not present

## 2019-03-25 DIAGNOSIS — K5190 Ulcerative colitis, unspecified, without complications: Secondary | ICD-10-CM | POA: Diagnosis not present

## 2019-03-25 DIAGNOSIS — I10 Essential (primary) hypertension: Secondary | ICD-10-CM

## 2019-03-25 DIAGNOSIS — M797 Fibromyalgia: Secondary | ICD-10-CM

## 2019-03-25 DIAGNOSIS — E1165 Type 2 diabetes mellitus with hyperglycemia: Secondary | ICD-10-CM

## 2019-03-25 MED ORDER — DULOXETINE HCL 20 MG PO CSDR: 20.0000 mg | DELAYED_RELEASE_CAPSULE | Freq: Every day | ORAL | 2 refills | Status: DC

## 2019-03-25 MED ORDER — TRIAMCINOLONE ACETONIDE 0.1 % EX CREA: 1.0000 "application " | TOPICAL_CREAM | Freq: Two times a day (BID) | CUTANEOUS | 0 refills | Status: DC

## 2019-03-25 NOTE — Progress Notes (Signed)
7/17/20202:43 PM  Beverly Ramirez Sep 18, 1960, 58 y.o., female [id number]  Chief Complaint  Patient presents with  . Diabetes  . GI Problem    still having stomach pain and now has the blisters in the mouth for 3 days. Was told by gi to go to the hospital for admittance    HPI:   Patient is a 58 y.o. female with past medical history significant for ulcerative colitis, HLP, HTN, DM2 who presents today for routine followup  Last OV May 2020 Started trial of gabapentin for myalgias suggestive of fibromyalgia  Dr Carlean Purl, GI, advised ER for further eval Afraid to go to ER She put herself in a liquid diet which helped Started to eat again, so started with profuse watery non bloody diarrhea yesterday x 2, and blisters in her mouth Abdominal pain - cramping, nausea but no vomiting if she eats saltine crackers Taking her UC medication daily covid negative No fever or chills, cough, SOB, chest pain, palpitations  Has been taking benadryl for hives for past 3-4 days Very stressed with family deaths and her sister currently in ICU with sickle cell crisis  Fasting cbg 108 this morning Highest 144 during the past 2 weeks Only doing metformin 534m BID, denies any GI side effects has not needed insulin  Did not tolerate gabapentin, too sedating Muscle pain overall less, though still has some days when she is very achy  Needs to schedule her sleep study now that lab is back open  [facility] 03/25/2019 03/08/2019 02/03/2019  Decreased Interest 0 0 0  Down, Depressed, Hopeless 0 0 0  PHQ - 2 Score 0 0 0    Fall Risk  03/25/2019 03/08/2019 02/03/2019 01/21/2019 01/14/2019  Falls in the past year? 0 0 0 0 0  Number falls in past yr: 0 0 0 0 0  Injury with Fall? 0 0 0 0 0  Follow up - Falls evaluation completed - - -     Allergies  Allergen Reactions  . Codeine Itching, Rash and Hives  . Latex Hives    With the power    Prior to Admission medications   Medication  Sig Start Date End Date Taking? Authorizing Provider  amLODipine (NORVASC) 5 MG tablet Take 1 tablet (5 mg total) by mouth daily. 05/07/18  Yes SRutherford Guys MD  blood glucose meter kit and supplies KIT Based on patient's insurance. Check twice a day. Dx R73.9, Z79.52 09/24/18  Yes SRutherford Guys MD  fluticasone ([facility] 50 MCG/ACT nasal spray Place 2 sprays into both nostrils daily. 12/23/18  Yes FJacqlyn Larsen PA-C  gabapentin (NEURONTIN) 300 MG capsule TAKE 1 CAPSULE BY MOUTH THREE TIMES A DAY 02/16/19  Yes SRutherford Guys MD  Insulin Glargine (LANTUS SOLOSTAR) 100 UNIT/ML Solostar Pen Inject 20 Units into the skin at bedtime. 11/11/18  Yes Lamptey, PMyrene Galas MD  Insulin Syringe-Needle U-100 (INSULIN SYRINGE 1CC/31GX5/16") 31G X 5/16" 1 ML MISC Inject 0.08 ( 8 Units)  into the skin 3 times daily before meals. 10/16/18  Yes SRutherford Guys MD  Lancets (ONETOUCH DELICA PLUS LIFOYDX41O MLisbon1 each by Does not apply route 3 (three) times daily. Dx E11.65, z79.4 01/24/19  Yes SRutherford Guys MD  mesalamine (LIALDA) 1.2 g EC tablet Take 2 tablets (2.4 g total) by mouth 2 (two) times daily. 11/25/18 11/20/19 Yes GGatha Mayer MD  metFORMIN (GLUCOPHAGE) 500 MG tablet TAKE 1 TABLET BY MOUTH TWICE DAILY FOR TWO WEEKS.  THEN TAKE 2 TABLETS BY MOUTH TWICE DAILY. 02/22/19  Yes Rutherford Guys, MD  NOVOLIN R 100 UNIT/ML injection INJECT 8 UNITS INTO THE SKIN 3 TIMES DAILY BEFORE MEALS. 03/20/19  Yes Rutherford Guys, MD  [facility] VERIO test strip CHECK BLOOD SUGAR TWICE A DAY. 12/08/18  Yes Rutherford Guys, MD    Past Medical History:  Diagnosis Date  . Anemia   . Carpal tunnel syndrome    bilateral  . Chronic heel pain   . DJD (degenerative joint disease) of cervical spine    MRI 2017  . Hyperlipidemia   . Plantar fasciitis    bilateral  . Tendonitis    left hand/wrist  . UC (ulcerative colitis) ([place])     Past Surgical History:  Procedure Laterality Date  . ABDOMINAL HYSTERECTOMY    .  APPENDECTOMY    . CESAREAN SECTION     x4  . COLONOSCOPY     Multiple in [place]  . ESOPHAGOGASTRODUODENOSCOPY      Social History   Tobacco Use  . Smoking status: Former Smoker    Types: Cigarettes    Quit date: 10/13/2006    Years since quitting: 12.4  . Smokeless tobacco: Never Used  Substance Use Topics  . Alcohol use: Yes    Comment: occasional    Family History  Problem Relation Age of Onset  . Stroke Mother   . Diabetes Mother   . Hypertension Mother   . Lung cancer Mother   . Sickle cell trait Mother   . Breast cancer Maternal Aunt   . Diabetes Maternal Aunt   . Lung cancer Maternal Aunt   . Lung cancer Maternal Aunt   . Lung cancer Maternal Aunt   . Colon cancer Cousin 25  . Sickle cell trait Father   . Sickle cell anemia Sister 35  . Lung cancer Maternal Grandmother   . Sickle cell anemia Sister   . Other Sister        accident and blood clot formed    ROS Per hpi  OBJECTIVE:  Today's Vitals   03/25/19 1422  BP: 135/86  Pulse: 78  Temp: 98.7 F (37.1 C)  TempSrc: Oral  SpO2: 97%  Weight: 160 lb (72.6 kg)  Height: 5' 2"  (1.575 m)   Body mass index is 29.26 kg/m.   Physical Exam Vitals signs and nursing note reviewed.  Constitutional:      Appearance: She is well-developed.  HENT:     Head: Normocephalic and atraumatic.     Mouth/Throat:     Pharynx: No oropharyngeal exudate.  Eyes:     General: No scleral icterus.    Conjunctiva/sclera: Conjunctivae normal.     Pupils: Pupils are equal, round, and reactive to light.  Neck:     Musculoskeletal: Neck supple.  Cardiovascular:     Rate and Rhythm: Normal rate and regular rhythm.     Heart sounds: Normal heart sounds. No murmur. No friction rub. No gallop.   Pulmonary:     Effort: Pulmonary effort is normal.     Breath sounds: Normal breath sounds. No wheezing or rales.  Skin:    General: Skin is warm and dry.  Neurological:     Mental Status: She is alert and oriented to  person, place, and time.     ASSESSMENT and PLAN  1. Type 2 diabetes mellitus with hyperglycemia, with long-term current use of insulin (HCC) cbgs at goal off insulin. Labs pending. Continue metformin  for now.  - Hemoglobin A1c - CMP14+EGFR  2. Ulcerative colitis without complications, unspecified location [facility]) Not controlled. Reiterated GI advise for ER evaluation. - CMP14+EGFR - CBC - Sedimentation Rate - C-reactive protein  3. Essential hypertension, benign Controlled. Continue current regime.   4. Fibromyalgia Did not tolerate gabapentin. Trial of low dose cymbalta.   Advised schedule sleep study  Other orders - DULoxetine HCl 20 MG CSDR; Take 20 mg by mouth daily. - triamcinolone cream (KENALOG) 0.1 %; Apply 1 application topically 2 (two) times daily.  Return in about 4 weeks (around 04/22/2019).    Rutherford Guys, MD Primary Care at [place], [place] [address] Ph.  [phone] Fax [phone]

## 2019-03-25 NOTE — Patient Instructions (Addendum)
    [facility] [phone]   If you have lab work done today you will be contacted with your lab results within the next 2 weeks.  If you have not heard from [place] then please contact us. The fastest way to get your results is to register for My Chart.   IF you received an x-ray today, you will receive an invoice from [facility]. Please contact [facility] at [phone] with questions or concerns regarding your invoice.   IF you received labwork today, you will receive an invoice from [place]. Please contact [facility] at [phone] with questions or concerns regarding your invoice.   Our billing staff will not be able to assist you with questions regarding bills from these companies.  You will be contacted with the lab results as soon as they are available. The fastest way to get your results is to activate your My Chart account. Instructions are located on the last page of this paperwork. If you have not heard from [place] regarding the results in 2 weeks, please contact this office.

## 2019-03-26 LAB — SEDIMENTATION RATE: Sed Rate: 14 mm/hr (ref 0–40)

## 2019-03-26 LAB — CMP14+EGFR
ALT: 37 IU/L — ABNORMAL HIGH (ref 0–32)
AST: 30 IU/L (ref 0–40)
Albumin/Globulin Ratio: 1.4 (ref 1.2–2.2)
Albumin: 3.8 g/dL (ref 3.8–4.9)
Alkaline Phosphatase: 113 IU/L (ref 39–117)
BUN/Creatinine Ratio: 18 (ref 9–23)
BUN: 14 mg/dL (ref 6–24)
Bilirubin Total: 0.2 mg/dL (ref 0.0–1.2)
CO2: 25 mmol/L (ref 20–29)
Calcium: 9.6 mg/dL (ref 8.7–10.2)
Chloride: 100 mmol/L (ref 96–106)
Creatinine, Ser: 0.79 mg/dL (ref 0.57–1.00)
GFR calc Af Amer: 96 mL/min/{1.73_m2} (ref >59)
GFR calc non Af Amer: 83 mL/min/{1.73_m2} (ref >59)
Globulin, Total: 2.7 g/dL (ref 1.5–4.5)
Glucose: 87 mg/dL (ref 65–99)
Potassium: 3.9 mmol/L (ref 3.5–5.2)
Sodium: 141 mmol/L (ref 134–144)
Total Protein: 6.5 g/dL (ref 6.0–8.5)

## 2019-03-26 LAB — CBC
Hematocrit: 34.6 % (ref 34.0–46.6)
Hemoglobin: 11.4 g/dL (ref 11.1–15.9)
MCH: 28.2 pg (ref 26.6–33.0)
MCHC: 32.9 g/dL (ref 31.5–35.7)
MCV: 86 fL (ref 79–97)
Platelets: 422 10*3/uL (ref 150–450)
RBC: 4.04 x10E6/uL (ref 3.77–5.28)
RDW: 13.5 % (ref 11.7–15.4)
WBC: 11.6 10*3/uL — ABNORMAL HIGH (ref 3.4–10.8)

## 2019-03-26 LAB — HEMOGLOBIN A1C
Est. average glucose Bld gHb Est-mCnc: 171 mg/dL
Hgb A1c MFr Bld: 7.6 % — ABNORMAL HIGH (ref 4.8–5.6)

## 2019-03-26 LAB — C-REACTIVE PROTEIN: CRP: 1 mg/L (ref 0–10)

## 2019-04-21 ENCOUNTER — Encounter: Payer: Self-pay | Admitting: Radiology

## 2019-04-26 ENCOUNTER — Encounter: Payer: Self-pay | Admitting: Family Medicine

## 2019-04-26 ENCOUNTER — Ambulatory Visit (INDEPENDENT_AMBULATORY_CARE_PROVIDER_SITE_OTHER): Payer: Medicare Other | Admitting: Family Medicine

## 2019-04-26 ENCOUNTER — Other Ambulatory Visit: Payer: Self-pay

## 2019-04-26 ENCOUNTER — Ambulatory Visit (INDEPENDENT_AMBULATORY_CARE_PROVIDER_SITE_OTHER): Payer: Medicare Other

## 2019-04-26 VITALS — BP 134/86 | HR 69 | Temp 98.9°F | Ht 62.0 in | Wt 156.0 lb

## 2019-04-26 DIAGNOSIS — M797 Fibromyalgia: Secondary | ICD-10-CM

## 2019-04-26 DIAGNOSIS — M654 Radial styloid tenosynovitis [de Quervain]: Secondary | ICD-10-CM

## 2019-04-26 DIAGNOSIS — R059 Cough, unspecified: Secondary | ICD-10-CM

## 2019-04-26 DIAGNOSIS — R05 Cough: Secondary | ICD-10-CM | POA: Diagnosis not present

## 2019-04-26 IMAGING — DX CHEST - 2 VIEW
2 series · 2 of 2 positions shown · non-contrast
Comparison: [DATE]

CLINICAL DATA: Productive cough and wheezing

EXAM:
CHEST - 2 VIEW

[chest pa]
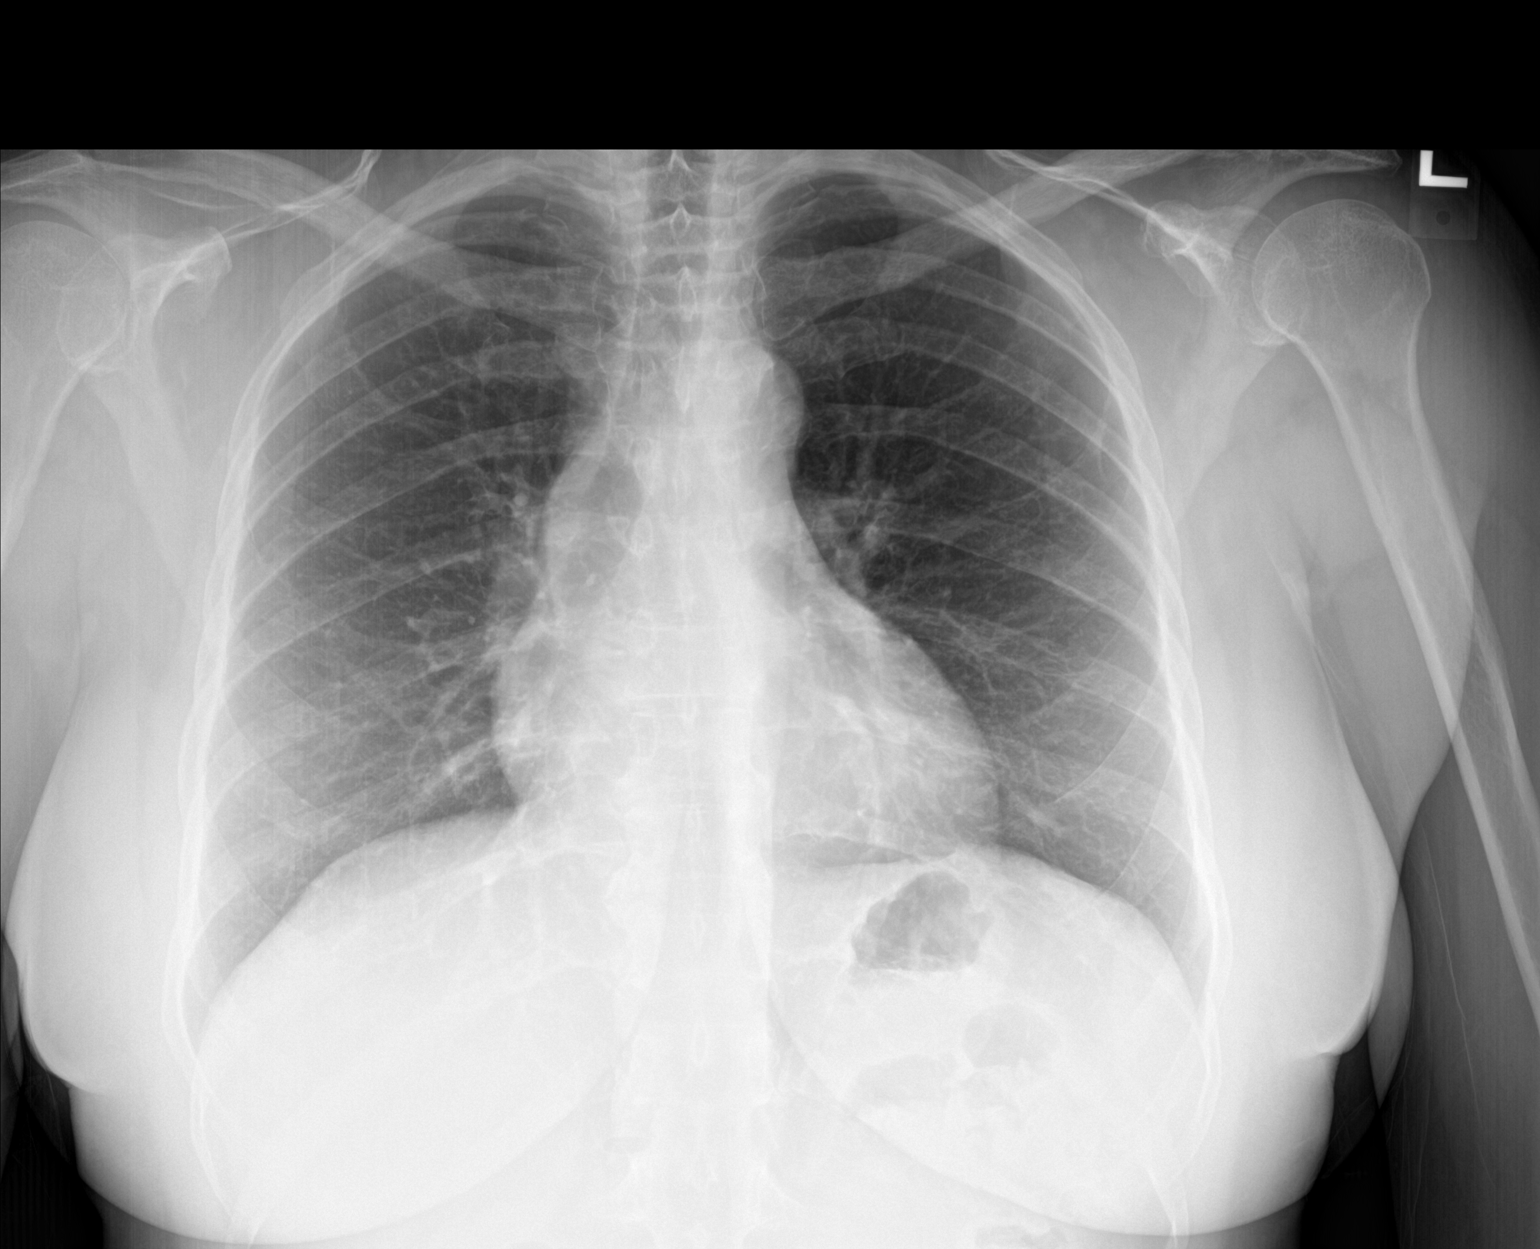

[chest lat]
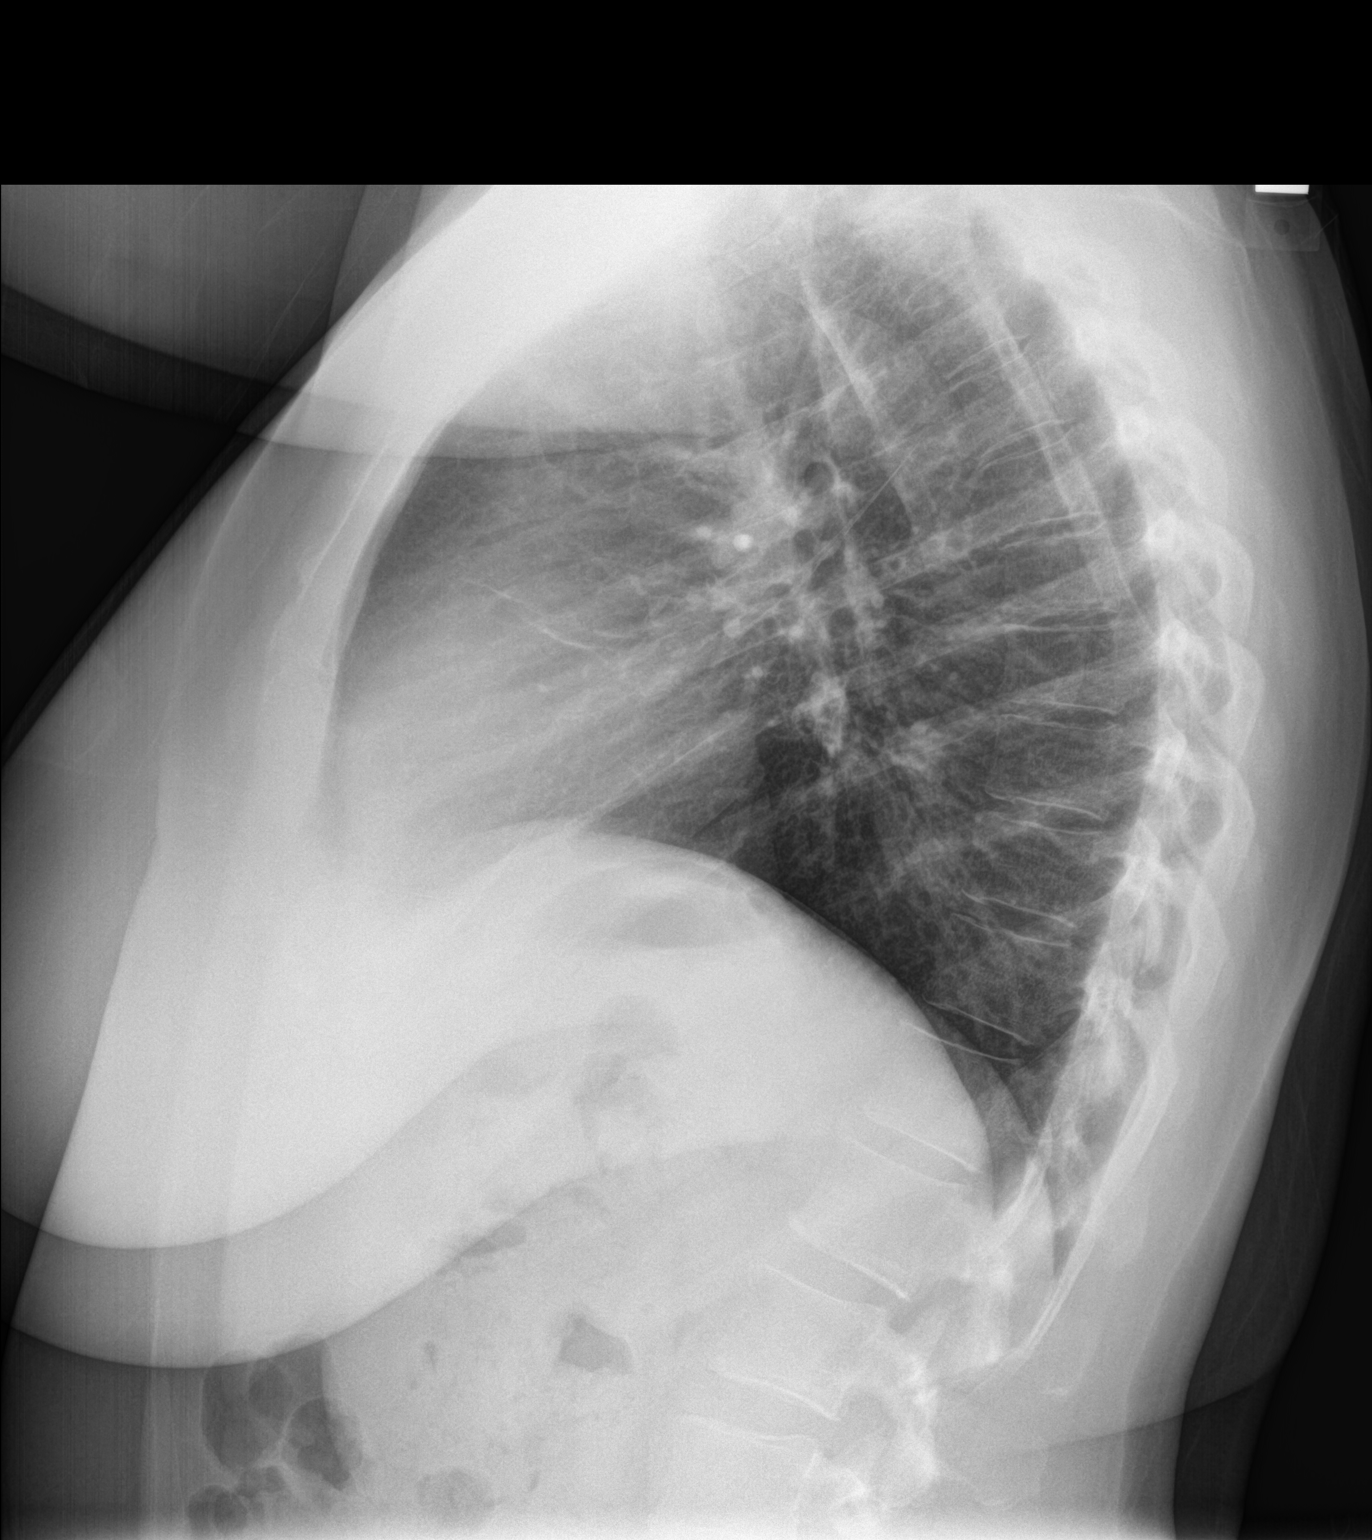

[2 of 2 positions shown; findings below may reference images not displayed]

FINDINGS: The heart size and mediastinal contours are within normal limits.
Both lungs are clear. The visualized skeletal structures are
unremarkable.
IMPRESSION: No active cardiopulmonary disease.

## 2019-04-26 MED ORDER — DULOXETINE HCL 30 MG PO CPEP: 30.0000 mg | ORAL_CAPSULE | Freq: Every day | ORAL | 3 refills | Status: DC

## 2019-04-26 NOTE — Progress Notes (Signed)
 8/18/202011:41 AM  Beverly Ramirez 09/04/1961, 58 y.o., female [id number]  Chief Complaint  Patient presents with  . Fibromyalgia    HPI:   Patient is a 58 y.o. female with past medical history significant for ulcerative colitis, HLP, HTN, DM2, fibromyalgia who presents today for routine followup  Last OV July 2020 Started cymbalta for fibromyalgia - never started, pharmacy never received Muscles are achy Having issues with her left hand de quervains' tensosynovitis - had injection in march, has been wearing brace, requesting referral to PT Has been having productive, yellow/green mucous, cough x 5 days, her husband says she has been wheezing No fevers, SOB, no sinus pressure, no itchy or water eyes, sore throat, no loss or smell or taste Having stuffy nose Has not been taking anything for these sx   Lab Results  Component Value Date   HGBA1C 7.6 (H) 03/25/2019   HGBA1C 9.5 (A) 11/10/2018   Lab Results  Component Value Date   LDLCALC 151 (H) 01/08/2018   CREATININE 0.79 03/25/2019    Depression screen PHQ 2/9 04/26/2019 03/25/2019 03/08/2019  Decreased Interest 0 0 0  Down, Depressed, Hopeless 0 0 0  PHQ - 2 Score 0 0 0    Fall Risk  04/26/2019 03/25/2019 03/08/2019 02/03/2019 01/21/2019  Falls in the past year? 0 0 0 0 0  Number falls in past yr: 0 0 0 0 0  Injury with Fall? 0 0 0 0 0  Follow up - - Falls evaluation completed - -     Allergies  Allergen Reactions  . Codeine Itching, Rash and Hives  . Latex Hives    With the power    Prior to Admission medications   Medication Sig Start Date End Date Taking? Authorizing Provider  amLODipine (NORVASC) 5 MG tablet Take 1 tablet (5 mg total) by mouth daily. 05/07/18  Yes Santiago,  M, MD  blood glucose meter kit and supplies KIT Based on patient's insurance. Check twice a day. Dx R73.9, Z79.52 09/24/18  Yes Santiago,  M, MD  DULoxetine HCl 20 MG CSDR Take 20 mg by mouth daily. 03/25/19  Yes Santiago,  M,  MD  fluticasone (FLONASE) 50 MCG/ACT nasal spray Place 2 sprays into both nostrils daily. 12/23/18  Yes Ford, Kelsey N, PA-C  Insulin Glargine (LANTUS SOLOSTAR) 100 UNIT/ML Solostar Pen Inject 20 Units into the skin at bedtime. 11/11/18  Yes Lamptey, Philip O, MD  Insulin Syringe-Needle U-100 (INSULIN SYRINGE 1CC/31GX5/16") 31G X 5/16" 1 ML MISC Inject 0.08 ( 8 Units)  into the skin 3 times daily before meals. 10/16/18  Yes Santiago,  M, MD  Lancets (ONETOUCH DELICA PLUS LANCET33G) MISC 1 each by Does not apply route 3 (three) times daily. Dx E11.65, z79.4 01/24/19  Yes Santiago,  M, MD  mesalamine (LIALDA) 1.2 g EC tablet Take 2 tablets (2.4 g total) by mouth 2 (two) times daily. 11/25/18 11/20/19 Yes Gessner, Carl E, MD  metFORMIN (GLUCOPHAGE) 500 MG tablet TAKE 1 TABLET BY MOUTH TWICE DAILY FOR TWO WEEKS. THEN TAKE 2 TABLETS BY MOUTH TWICE DAILY. 02/22/19  Yes Santiago,  M, MD  NOVOLIN R 100 UNIT/ML injection INJECT 8 UNITS INTO THE SKIN 3 TIMES DAILY BEFORE MEALS. 03/20/19  Yes Santiago,  M, MD  ONETOUCH VERIO test strip CHECK BLOOD SUGAR TWICE A DAY. 12/08/18  Yes Santiago,  M, MD  triamcinolone cream (KENALOG) 0.1 % Apply 1 application topically 2 (two) times daily. 03/25/19  Yes Santiago,  M, MD      Past Medical History:  Diagnosis Date  . Anemia   . Carpal tunnel syndrome    bilateral  . Chronic heel pain   . DJD (degenerative joint disease) of cervical spine    MRI 2017  . Hyperlipidemia   . Plantar fasciitis    bilateral  . Tendonitis    left hand/wrist  . UC (ulcerative colitis) (HCC)     Past Surgical History:  Procedure Laterality Date  . ABDOMINAL HYSTERECTOMY    . APPENDECTOMY    . CESAREAN SECTION     x4  . COLONOSCOPY     Multiple in [place]  . ESOPHAGOGASTRODUODENOSCOPY      Social History   Tobacco Use  . Smoking status: Former Smoker    Types: Cigarettes    Quit date: 10/13/2006    Years since quitting: 12.5  . Smokeless tobacco: Never Used   Substance Use Topics  . Alcohol use: Yes    Comment: occasional    Family History  Problem Relation Age of Onset  . Stroke Mother   . Diabetes Mother   . Hypertension Mother   . Lung cancer Mother   . Sickle cell trait Mother   . Breast cancer Maternal Aunt   . Diabetes Maternal Aunt   . Lung cancer Maternal Aunt   . Lung cancer Maternal Aunt   . Lung cancer Maternal Aunt   . Colon cancer Cousin 30  . Sickle cell trait Father   . Sickle cell anemia Sister 32  . Lung cancer Maternal Grandmother   . Sickle cell anemia Sister   . Other Sister        accident and blood clot formed    ROS Per hpi  OBJECTIVE:  Today's Vitals   04/26/19 1132  BP: 134/86  Pulse: 69  Temp: 98.9 F (37.2 C)  TempSrc: Oral  SpO2: 96%  Weight: 156 lb (70.8 kg)  Height: 5' 2" (1.575 m)   Body mass index is 28.53 kg/m.   Physical Exam Vitals signs and nursing note reviewed.  Constitutional:      Appearance: She is well-developed.  HENT:     Head: Normocephalic and atraumatic.     Right Ear: Hearing, tympanic membrane, ear canal and external ear normal.     Left Ear: Hearing, tympanic membrane, ear canal and external ear normal.  Eyes:     Conjunctiva/sclera: Conjunctivae normal.     Pupils: Pupils are equal, round, and reactive to light.  Neck:     Musculoskeletal: Neck supple.  Cardiovascular:     Rate and Rhythm: Normal rate and regular rhythm.     Heart sounds: Normal heart sounds. No murmur. No friction rub. No gallop.   Pulmonary:     Effort: Pulmonary effort is normal.     Breath sounds: Normal breath sounds. No wheezing or rales.  Lymphadenopathy:     Cervical: No cervical adenopathy.  Skin:    General: Skin is warm and dry.  Neurological:     Mental Status: She is alert and oriented to person, place, and time.    Dg Chest 2 View  Result Date: 04/26/2019 CLINICAL DATA:  Productive cough and wheezing EXAM: CHEST - 2 VIEW COMPARISON:  01/12/2011 FINDINGS: The heart  size and mediastinal contours are within normal limits. Both lungs are clear. The visualized skeletal structures are unremarkable. IMPRESSION: No active cardiopulmonary disease. Electronically Signed   By: Mark  Lukens M.D.   On: 04/26/2019 12:15    ASSESSMENT and   PLAN  1. Fibromyalgia written rx for duloxetine give  2. De Quervain's tenosynovitis, left - Ambulatory referral to Physical Therapy  3. Cough Patient evaluated, tested in clinic and sent home with instructions for home care and Quarantine. Instructed to seek further care if symptoms worsen.  - DG Chest 2 View; Future - Novel Coronavirus, NAA ([facility])  Other orders - DULoxetine (CYMBALTA) 30 MG capsule; Take 1 capsule (30 mg total) by mouth daily.  Return in about 3 months (around 07/27/2019).    Rutherford Guys, MD Primary Care at [place], [place] [address] Ph.  [phone] Fax [phone]

## 2019-04-26 NOTE — Patient Instructions (Addendum)
Person Under Monitoring Name: Beverly Ramirez  Location: [place] [address]   Infection Prevention Recommendations for Individuals Confirmed to have, or Being Evaluated for, 2019 Novel Coronavirus (COVID-19) Infection Who Receive Care at Home  Individuals who are confirmed to have, or are being evaluated for, COVID-19 should follow the prevention steps below until a healthcare provider or local or state health department says they can return to normal activities.  Stay home except to get medical care You should restrict activities outside your home, except for getting medical care. Do not go to work, school, or public areas, and do not use public transportation or taxis.  Call ahead before visiting your doctor Before your medical appointment, call the healthcare provider and tell them that you have, or are being evaluated for, COVID-19 infection. This will help the healthcare provider's office take steps to keep other people from getting infected. Ask your healthcare provider to call the local or state health department.  Monitor your symptoms Seek prompt medical attention if your illness is worsening (e.g., difficulty breathing). Before going to your medical appointment, call the healthcare provider and tell them that you have, or are being evaluated for, COVID-19 infection. Ask your healthcare provider to call the local or state health department.  Wear a facemask You should wear a facemask that covers your nose and mouth when you are in the same room with other people and when you visit a healthcare provider. People who live with or visit you should also wear a facemask while they are in the same room with you.  Separate yourself from other people in your home As much as possible, you should stay in a different room from other people in your home. Also, you should use a separate bathroom, if available.  Avoid sharing household items You should not  share dishes, drinking glasses, cups, eating utensils, towels, bedding, or other items with other people in your home. After using these items, you should wash them thoroughly with soap and water.  Cover your coughs and sneezes Cover your mouth and nose with a tissue when you cough or sneeze, or you can cough or sneeze into your sleeve. Throw used tissues in a lined trash can, and immediately wash your hands with soap and water for at least 20 seconds or use an alcohol-based hand rub.  Wash your [facility] your hands often and thoroughly with soap and water for at least 20 seconds. You can use an alcohol-based hand sanitizer if soap and water are not available and if your hands are not visibly dirty. Avoid touching your eyes, nose, and mouth with unwashed hands.   Prevention Steps for Caregivers and Household Members of Individuals Confirmed to have, or Being Evaluated for, COVID-19 Infection Being Cared for in the Home  If you live with, or provide care at home for, a person confirmed to have, or being evaluated for, COVID-19 infection please follow these guidelines to prevent infection:  Follow healthcare provider's instructions Make sure that you understand and can help the patient follow any healthcare provider instructions for all care.  Provide for the patient's basic needs You should help the patient with basic needs in the home and provide support for getting groceries, prescriptions, and other personal needs.  Monitor the patient's symptoms If they are getting sicker, call his or her medical provider and tell them that the patient has, or is being evaluated for, COVID-19 infection. This will help the healthcare provider's office  take steps to keep other people from getting infected. Ask the healthcare provider to call the local or state health department.  Limit the number of people who have contact with the patient  If possible, have only one caregiver for the  patient.  Other household members should stay in another home or place of residence. If this is not possible, they should stay  in another room, or be separated from the patient as much as possible. Use a separate bathroom, if available.  Restrict visitors who do not have an essential need to be in the home.  Keep older adults, very young children, and other sick people away from the patient Keep older adults, very young children, and those who have compromised immune systems or chronic health conditions away from the patient. This includes people with chronic heart, lung, or kidney conditions, diabetes, and cancer.  Ensure good ventilation Make sure that shared spaces in the home have good air flow, such as from an air conditioner or an opened window, weather permitting.  Wash your hands often  Wash your hands often and thoroughly with soap and water for at least 20 seconds. You can use an alcohol based hand sanitizer if soap and water are not available and if your hands are not visibly dirty.  Avoid touching your eyes, nose, and mouth with unwashed hands.  Use disposable paper towels to dry your hands. If not available, use dedicated cloth towels and replace them when they become wet.  Wear a facemask and gloves  Wear a disposable facemask at all times in the room and gloves when you touch or have contact with the patient's blood, body fluids, and/or secretions or excretions, such as sweat, saliva, sputum, nasal mucus, vomit, urine, or feces.  Ensure the mask fits over your nose and mouth tightly, and do not touch it during use.  Throw out disposable facemasks and gloves after using them. Do not reuse.  Wash your hands immediately after removing your facemask and gloves.  If your personal clothing becomes contaminated, carefully remove clothing and launder. Wash your hands after handling contaminated clothing.  Place all used disposable facemasks, gloves, and other waste in a lined  container before disposing them with other household waste.  Remove gloves and wash your hands immediately after handling these items.  Do not share dishes, glasses, or other household items with the patient  Avoid sharing household items. You should not share dishes, drinking glasses, cups, eating utensils, towels, bedding, or other items with a patient who is confirmed to have, or being evaluated for, COVID-19 infection.  After the person uses these items, you should wash them thoroughly with soap and water.  Wash laundry thoroughly  Immediately remove and wash clothes or bedding that have blood, body fluids, and/or secretions or excretions, such as sweat, saliva, sputum, nasal mucus, vomit, urine, or feces, on them.  Wear gloves when handling laundry from the patient.  Read and follow directions on labels of laundry or clothing items and detergent. In general, wash and dry with the warmest temperatures recommended on the label.  Clean all areas the individual has used often  Clean all touchable surfaces, such as counters, tabletops, doorknobs, bathroom fixtures, toilets, phones, keyboards, tablets, and bedside tables, every day. Also, clean any surfaces that may have blood, body fluids, and/or secretions or excretions on them.  Wear gloves when cleaning surfaces the patient has come in contact with.  Use a diluted bleach solution (e.g., dilute bleach with 1 part  bleach and 10 parts water) or a household disinfectant with a label that says EPA-registered for coronaviruses. To make a bleach solution at home, add 1 tablespoon of bleach to 1 quart (4 cups) of water. For a larger supply, add  cup of bleach to 1 gallon (16 cups) of water.  Read labels of cleaning products and follow recommendations provided on product labels. Labels contain instructions for safe and effective use of the cleaning product including precautions you should take when applying the product, such as wearing gloves or  eye protection and making sure you have good ventilation during use of the product.  Remove gloves and wash hands immediately after cleaning.  Monitor yourself for signs and symptoms of illness Caregivers and household members are considered close contacts, should monitor their health, and will be asked to limit movement outside of the home to the extent possible. Follow the monitoring steps for close contacts listed on the symptom monitoring form.   ? If you have additional questions, contact your local health department or call the epidemiologist on call at [phone] (available 24/7). ? This guidance is subject to change. For the most up-to-date guidance from [facility], please refer to their website: [email or web address]   If you have lab work done today you will be contacted with your lab results within the next 2 weeks.  If you have not heard from [place] then please contact us. The fastest way to get your results is to register for My Chart.   IF you received an x-ray today, you will receive an invoice from [facility]. Please contact [facility] at [phone] with questions or concerns regarding your invoice.   IF you received labwork today, you will receive an invoice from [facility]. Please contact [facility] at [phone] with questions or concerns regarding your invoice.   Our billing staff will not be able to assist you with questions regarding bills from these companies.  You will be contacted with the lab results as soon as they are available. The fastest way to get your results is to activate your My Chart account. Instructions are located on the last page of this paperwork. If you have not heard from [place] regarding the results in 2 weeks, please contact this office.

## 2019-04-27 LAB — NOVEL CORONAVIRUS, NAA: SARS-CoV-2, NAA: NOT DETECTED

## 2019-04-29 ENCOUNTER — Other Ambulatory Visit: Payer: Self-pay | Admitting: Family Medicine

## 2019-04-29 DIAGNOSIS — M654 Radial styloid tenosynovitis [de Quervain]: Secondary | ICD-10-CM

## 2019-05-15 ENCOUNTER — Other Ambulatory Visit: Payer: Self-pay | Admitting: Family Medicine

## 2019-05-26 DIAGNOSIS — E0921 Drug or chemical induced diabetes mellitus with diabetic nephropathy: Secondary | ICD-10-CM | POA: Diagnosis not present

## 2019-05-26 DIAGNOSIS — E785 Hyperlipidemia, unspecified: Secondary | ICD-10-CM | POA: Diagnosis not present

## 2019-05-26 DIAGNOSIS — I10 Essential (primary) hypertension: Secondary | ICD-10-CM | POA: Diagnosis not present

## 2019-05-26 DIAGNOSIS — Z8249 Family history of ischemic heart disease and other diseases of the circulatory system: Secondary | ICD-10-CM | POA: Diagnosis not present

## 2019-05-31 ENCOUNTER — Other Ambulatory Visit: Payer: Self-pay | Admitting: Family Medicine

## 2019-06-01 DIAGNOSIS — E0921 Drug or chemical induced diabetes mellitus with diabetic nephropathy: Secondary | ICD-10-CM | POA: Diagnosis not present

## 2019-06-01 DIAGNOSIS — Z8249 Family history of ischemic heart disease and other diseases of the circulatory system: Secondary | ICD-10-CM | POA: Diagnosis not present

## 2019-06-01 DIAGNOSIS — E785 Hyperlipidemia, unspecified: Secondary | ICD-10-CM | POA: Diagnosis not present

## 2019-06-01 DIAGNOSIS — I10 Essential (primary) hypertension: Secondary | ICD-10-CM | POA: Diagnosis not present

## 2019-06-13 ENCOUNTER — Encounter: Payer: Self-pay | Admitting: Family Medicine

## 2019-06-13 ENCOUNTER — Telehealth (INDEPENDENT_AMBULATORY_CARE_PROVIDER_SITE_OTHER): Payer: Medicare Other | Admitting: Family Medicine

## 2019-06-13 ENCOUNTER — Other Ambulatory Visit: Payer: Self-pay

## 2019-06-13 DIAGNOSIS — K5100 Ulcerative (chronic) pancolitis without complications: Secondary | ICD-10-CM

## 2019-06-13 DIAGNOSIS — E1165 Type 2 diabetes mellitus with hyperglycemia: Secondary | ICD-10-CM | POA: Diagnosis not present

## 2019-06-13 MED ORDER — MAGIC MOUTHWASH W/LIDOCAINE: 10.0000 mL | ORAL | 0 refills | Status: DC | PRN

## 2019-06-13 MED ORDER — TRIAMCINOLONE ACETONIDE 0.1 % EX CREA: 1.0000 "application " | TOPICAL_CREAM | Freq: Two times a day (BID) | CUTANEOUS | 0 refills | Status: DC

## 2019-06-13 MED ORDER — PREDNISONE 20 MG PO TABS: ORAL_TABLET | ORAL | 0 refills | Status: AC

## 2019-06-13 NOTE — Progress Notes (Signed)
Virtual Visit Note  I connected with patient on 06/13/19 at 540pm by phone and verified that I am speaking with the correct person using two identifiers. Beverly Ramirez is currently located at home and patient is currently with them during visit. The provider, Rutherford Guys, MD is located in their office at time of visit.  I discussed the limitations, risks, security and privacy concerns of performing an evaluation and management service by telephone and the availability of in person appointments. I also discussed with the patient that there may be a patient responsible charge related to this service. The patient expressed understanding and agreed to proceed.   CC: UC flare up  HPI ? Patient is a 58 y.o. female with past medical history significant for ulcerative colitis, HLP, HTN, DM2, fibromyalgiawho presents today with concerns for UC flare up  Having recurrent UC flare ups for past 3-4 weeks Treated at home with prednisone taper for about a week Last prednisone was about a week Having non bloody diarrhea, blisters in her mouth, hands are peeling Having left ear pain/burning, itchy, pain along LN LN not swollen No sore throat Has been taking benadryl - helped some Her cbgs have been doing well, has not needed insulin Drinking and normal UOP Has been on clear diet Last flare up June 2020 Has not called GI, last time he wanted her to go to ER and she does not want to go due to covid Still doing sulfasalazine Still has insulin    Allergies  Allergen Reactions  . Codeine Itching, Rash and Hives  . Latex Hives    With the power    Prior to Admission medications   Medication Sig Start Date End Date Taking? Authorizing Provider  amLODipine (NORVASC) 5 MG tablet TAKE 1 TABLET BY MOUTH EVERY DAY 05/31/19   Rutherford Guys, MD  blood glucose meter kit and supplies KIT Based on patient's insurance. Check twice a day. Dx R73.9, Z79.52 09/24/18   Rutherford Guys, MD   DULoxetine (CYMBALTA) 30 MG capsule Take 1 capsule (30 mg total) by mouth daily. 04/26/19   Rutherford Guys, MD  fluticasone (FLONASE) 50 MCG/ACT nasal spray Place 2 sprays into both nostrils daily. 12/23/18   Jacqlyn Larsen, PA-C  Insulin Glargine (LANTUS SOLOSTAR) 100 UNIT/ML Solostar Pen Inject 20 Units into the skin at bedtime. 11/11/18   LampteyMyrene Galas, MD  Insulin Syringe-Needle U-100 (INSULIN SYRINGE 1CC/31GX5/16") 31G X 5/16" 1 ML MISC Inject 0.08 ( 8 Units)  into the skin 3 times daily before meals. 10/16/18   Rutherford Guys, MD  Lancets [facility] DELICA PLUS [id number]) MISC 1 each by Does not apply route 3 (three) times daily. Dx E11.65, z79.4 01/24/19   Rutherford Guys, MD  mesalamine (LIALDA) 1.2 g EC tablet Take 2 tablets (2.4 g total) by mouth 2 (two) times daily. 11/25/18 11/20/19  Gatha Mayer, MD  metFORMIN (GLUCOPHAGE) 500 MG tablet TAKE 1 TABLET BY MOUTH TWICE DAILY FOR TWO WEEKS. THEN TAKE 2 TABLETS BY MOUTH TWICE DAILY. 02/22/19   Rutherford Guys, MD  NOVOLIN R 100 UNIT/ML injection INJECT 8 UNITS INTO THE SKIN 3 TIMES DAILY BEFORE MEALS. 03/20/19   Rutherford Guys, MD  [facility] VERIO test strip USE TO CHECK BLOOD SUGAR TWICE A DAY 05/15/19   Rutherford Guys, MD  triamcinolone cream (KENALOG) 0.1 % Apply 1 application topically 2 (two) times daily. 03/25/19   Rutherford Guys, MD    Past  Medical History:  Diagnosis Date  . Anemia   . Carpal tunnel syndrome    bilateral  . Chronic heel pain   . DJD (degenerative joint disease) of cervical spine    MRI 2017  . Hyperlipidemia   . Plantar fasciitis    bilateral  . Tendonitis    left hand/wrist  . UC (ulcerative colitis) ([place])     Past Surgical History:  Procedure Laterality Date  . ABDOMINAL HYSTERECTOMY    . APPENDECTOMY    . CESAREAN SECTION     x4  . COLONOSCOPY     Multiple in [place]  . ESOPHAGOGASTRODUODENOSCOPY      Social History   Tobacco Use  . Smoking status: Former Smoker    Types: Cigarettes     Quit date: 10/13/2006    Years since quitting: 12.6  . Smokeless tobacco: Never Used  Substance Use Topics  . Alcohol use: Yes    Comment: occasional    Family History  Problem Relation Age of Onset  . Stroke Mother   . Diabetes Mother   . Hypertension Mother   . Lung cancer Mother   . Sickle cell trait Mother   . Breast cancer Maternal Aunt   . Diabetes Maternal Aunt   . Lung cancer Maternal Aunt   . Lung cancer Maternal Aunt   . Lung cancer Maternal Aunt   . Colon cancer Cousin 28  . Sickle cell trait Father   . Sickle cell anemia Sister 48  . Lung cancer Maternal Grandmother   . Sickle cell anemia Sister   . Other Sister        accident and blood clot formed    ROS Per hpi  Objective  Vitals as reported by the patient: none   ASSESSMENT and PLAN  1. Ulcerative pancolitis without complication (HCC) Flare up, overall poor control. rx for pred and magic mouth wash given. Strongly urged her to follow up with GI. Discussed RTC precautions.  2. Type 2 diabetes mellitus with hyperglycemia, without long-term current use of insulin (HCC) Discussed increase testing, reviewed criteria for starting insulin.   Other orders - predniSONE (DELTASONE) 20 MG tablet; Take 3 tablets (60 mg total) by mouth daily with breakfast for 3 days, THEN 2 tablets (40 mg total) daily with breakfast for 3 days, THEN 1 tablet (20 mg total) daily with breakfast for 3 days, THEN 0.5 tablets (10 mg total) daily with breakfast for 3 days. - magic mouthwash w/lidocaine SOLN; Take 10 mLs by mouth every 2 (two) hours as needed for mouth pain. - triamcinolone cream (KENALOG) 0.1 %; Apply 1 application topically 2 (two) times daily.  FOLLOW-UP: prn   The above assessment and management plan was discussed with the patient. The patient verbalized understanding of and has agreed to the management plan. Patient is aware to call the clinic if symptoms persist or worsen. Patient is aware when to return to  the clinic for a follow-up visit. Patient educated on when it is appropriate to go to the emergency department.    I provided 15 minutes of non-face-to-face time during this encounter.  Rutherford Guys, MD Primary [facility] at [place], [place] [address] Ph.  [phone] Fax [phone]

## 2019-06-13 NOTE — Progress Notes (Signed)
Having a colitis flare, says she put herself on a prednisone tx 2 wks ago, the sx did start to improve. She has blisters in and on the mouth, using peroxide to gargle. Very hard for her to eat due to the pain in the mouth. Also, having pain in the ears, not sure if it is associated

## 2019-06-28 DIAGNOSIS — I10 Essential (primary) hypertension: Secondary | ICD-10-CM | POA: Diagnosis not present

## 2019-07-19 ENCOUNTER — Other Ambulatory Visit: Payer: Self-pay

## 2019-07-19 ENCOUNTER — Ambulatory Visit: Payer: PRIVATE HEALTH INSURANCE | Admitting: Internal Medicine

## 2019-07-19 ENCOUNTER — Ambulatory Visit
Admission: RE | Admit: 2019-07-19 | Discharge: 2019-07-19 | Disposition: A | Discharge status: Home / Self Care | Payer: Medicare Other | Source: Ambulatory Visit | Attending: Family Medicine | Admitting: Family Medicine

## 2019-07-19 DIAGNOSIS — Z13820 Encounter for screening for osteoporosis: Secondary | ICD-10-CM | POA: Diagnosis not present

## 2019-07-19 DIAGNOSIS — Z7952 Long term (current) use of systemic steroids: Secondary | ICD-10-CM

## 2019-07-19 DIAGNOSIS — K5190 Ulcerative colitis, unspecified, without complications: Secondary | ICD-10-CM

## 2019-07-19 DIAGNOSIS — Z780 Asymptomatic menopausal state: Secondary | ICD-10-CM | POA: Diagnosis not present

## 2019-07-19 DIAGNOSIS — D849 Immunodeficiency, unspecified: Secondary | ICD-10-CM

## 2019-07-25 ENCOUNTER — Encounter: Payer: Self-pay | Admitting: Family Medicine

## 2019-07-25 ENCOUNTER — Ambulatory Visit (INDEPENDENT_AMBULATORY_CARE_PROVIDER_SITE_OTHER): Payer: Medicare Other | Admitting: Family Medicine

## 2019-07-25 ENCOUNTER — Other Ambulatory Visit: Payer: Self-pay

## 2019-07-25 VITALS — BP 110/79 | HR 74 | Temp 98.1°F | Resp 12 | Ht 62.0 in | Wt 160.0 lb

## 2019-07-25 DIAGNOSIS — M797 Fibromyalgia: Secondary | ICD-10-CM

## 2019-07-25 DIAGNOSIS — K5100 Ulcerative (chronic) pancolitis without complications: Secondary | ICD-10-CM | POA: Diagnosis not present

## 2019-07-25 DIAGNOSIS — I10 Essential (primary) hypertension: Secondary | ICD-10-CM | POA: Diagnosis not present

## 2019-07-25 DIAGNOSIS — E1165 Type 2 diabetes mellitus with hyperglycemia: Secondary | ICD-10-CM

## 2019-07-25 MED ORDER — MAGIC MOUTHWASH W/LIDOCAINE: 10.0000 mL | ORAL | 4 refills | Status: DC | PRN

## 2019-07-25 MED ORDER — TRIAMCINOLONE ACETONIDE 0.1 % EX CREA: 1.0000 "application " | TOPICAL_CREAM | Freq: Two times a day (BID) | CUTANEOUS | 2 refills | Status: DC

## 2019-07-25 NOTE — Patient Instructions (Signed)
° ° ° °  If you have lab work done today you will be contacted with your lab results within the next 2 weeks.  If you have not heard from us then please contact us. The fastest way to get your results is to register for My Chart. ° ° °IF you received an x-ray today, you will receive an invoice from [facility]. Please contact  Radiology at [phone] with questions or concerns regarding your invoice.  ° °IF you received labwork today, you will receive an invoice from [facility]. Please contact [facility] at [phone] with questions or concerns regarding your invoice.  ° °Our billing staff will not be able to assist you with questions regarding bills from these companies. ° °You will be contacted with the lab results as soon as they are available. The fastest way to get your results is to activate your My Chart account. Instructions are located on the last page of this paperwork. If you have not heard from us regarding the results in 2 weeks, please contact this office. °  ° ° ° °

## 2019-07-25 NOTE — Progress Notes (Signed)
11/16/202012:14 PM  Beverly Ramirez July 07, 1961, 58 y.o., female [id number]  Chief Complaint  Patient presents with  . Follow-up    HPI:   Patient is a 58 y.o. female with past medical history significant for ulcerative colitis, HLP, HTN, DM2, fibromyalgiawho presents today for routine followup  Last OV Aug 2020 - started duloxetine for fibromylagia, took for a week, was feeling better so she stopped, having lots of muscle pain, achy  Having recurrent UC flare ups, just completed prednisone, having issues with getting a hold of her GI Not having blood diarrhea, having constant nausea, mouth ulcers and hands peeling and burning - this is what most of her flare ups are like Taking mesalamine Last colonoscopy in [place] Her appt on the 10th got canceled by automated system  She has been off BP medications for past 3-4 weeks  Checking BP at home, same as today  Checks cbgs at home Mostly < 130 Takes metformin daily Has not needed insulin  Lab Results  Component Value Date   HGBA1C 7.6 (H) 03/25/2019   HGBA1C 9.5 (A) 11/10/2018   Lab Results  Component Value Date   LDLCALC 151 (H) 01/08/2018   CREATININE 0.79 03/25/2019    Depression screen PHQ 2/9 07/25/2019 06/13/2019 04/26/2019  Decreased Interest 0 0 0  Down, Depressed, Hopeless 0 0 0  PHQ - 2 Score 0 0 0    Fall Risk  07/25/2019 06/13/2019 04/26/2019 03/25/2019 03/08/2019  Falls in the past year? 0 0 0 0 0  Number falls in past yr: 0 0 0 0 0  Injury with Fall? 0 0 0 0 0  Follow up - - - - Falls evaluation completed     Allergies  Allergen Reactions  . Codeine Itching, Rash and Hives  . Latex Hives    With the power    Prior to Admission medications   Medication Sig Start Date End Date Taking? Authorizing Provider  amLODipine (NORVASC) 5 MG tablet TAKE 1 TABLET BY MOUTH EVERY DAY 05/31/19  Yes Rutherford Guys, MD  blood glucose meter kit and supplies KIT Based on patient's insurance. Check twice a day.  Dx R73.9, Z79.52 09/24/18  Yes Rutherford Guys, MD  DULoxetine (CYMBALTA) 30 MG capsule Take 1 capsule (30 mg total) by mouth daily. 04/26/19  Yes Rutherford Guys, MD  fluticasone [facility] 50 MCG/ACT nasal spray Place 2 sprays into both nostrils daily. 12/23/18  Yes Jacqlyn Larsen, PA-C  Insulin Glargine (LANTUS SOLOSTAR) 100 UNIT/ML Solostar Pen Inject 20 Units into the skin at bedtime. 11/11/18  Yes Lamptey, Myrene Galas, MD  Insulin Syringe-Needle U-100 (INSULIN SYRINGE 1CC/31GX5/16") 31G X 5/16" 1 ML MISC Inject 0.08 ( 8 Units)  into the skin 3 times daily before meals. 10/16/18  Yes Rutherford Guys, MD  Lancets (ONETOUCH DELICA PLUS EQASTM19Q) [place] 1 each by Does not apply route 3 (three) times daily. Dx E11.65, z79.4 01/24/19  Yes Rutherford Guys, MD  magic mouthwash w/lidocaine SOLN Take 10 mLs by mouth every 2 (two) hours as needed for mouth pain. 06/13/19  Yes Rutherford Guys, MD  mesalamine (LIALDA) 1.2 g EC tablet Take 2 tablets (2.4 g total) by mouth 2 (two) times daily. 11/25/18 11/20/19 Yes Gatha Mayer, MD  metFORMIN (GLUCOPHAGE) 500 MG tablet TAKE 1 TABLET BY MOUTH TWICE DAILY FOR TWO WEEKS. THEN TAKE 2 TABLETS BY MOUTH TWICE DAILY. 02/22/19  Yes Rutherford Guys, MD  NOVOLIN R 100 UNIT/ML injection INJECT  8 UNITS INTO THE SKIN 3 TIMES DAILY BEFORE MEALS. 03/20/19  Yes Rutherford Guys, MD  [facility] VERIO test strip USE TO CHECK BLOOD SUGAR TWICE A DAY 05/15/19  Yes Rutherford Guys, MD  triamcinolone cream (KENALOG) 0.1 % Apply 1 application topically 2 (two) times daily. 06/13/19  Yes Rutherford Guys, MD    Past Medical History:  Diagnosis Date  . Anemia   . Carpal tunnel syndrome    bilateral  . Chronic heel pain   . DJD (degenerative joint disease) of cervical spine    MRI 2017  . Hyperlipidemia   . Plantar fasciitis    bilateral  . Tendonitis    left hand/wrist  . UC (ulcerative colitis) ([place])     Past Surgical History:  Procedure Laterality Date  . ABDOMINAL HYSTERECTOMY     . APPENDECTOMY    . CESAREAN SECTION     x4  . COLONOSCOPY     Multiple in [place]  . ESOPHAGOGASTRODUODENOSCOPY      Social History   Tobacco Use  . Smoking status: Former Smoker    Types: Cigarettes    Quit date: 10/13/2006    Years since quitting: 12.7  . Smokeless tobacco: Never Used  Substance Use Topics  . Alcohol use: Yes    Comment: occasional    Family History  Problem Relation Age of Onset  . Stroke Mother   . Diabetes Mother   . Hypertension Mother   . Lung cancer Mother   . Sickle cell trait Mother   . Breast cancer Maternal Aunt   . Diabetes Maternal Aunt   . Lung cancer Maternal Aunt   . Lung cancer Maternal Aunt   . Lung cancer Maternal Aunt   . Colon cancer Cousin 73  . Sickle cell trait Father   . Sickle cell anemia Sister 31  . Lung cancer Maternal Grandmother   . Sickle cell anemia Sister   . Other Sister        accident and blood clot formed    Review of Systems  Constitutional: Negative for chills and fever.  Respiratory: Negative for cough and shortness of breath.   Cardiovascular: Negative for chest pain, palpitations and leg swelling.  Gastrointestinal: Positive for nausea. Negative for abdominal pain, blood in stool, diarrhea, melena and vomiting.  per hpi   OBJECTIVE:  Today's Vitals   07/25/19 1205  BP: 110/79  Pulse: 74  Resp: 12  Temp: 98.1 F (36.7 C)  SpO2: 95%  Weight: 160 lb (72.6 kg)  Height: _0  (1.575 m)   Body mass index is 29.26 kg/m.   Physical Exam Vitals signs and nursing note reviewed.  Constitutional:      Appearance: She is well-developed.  HENT:     Head: Normocephalic and atraumatic.     Mouth/Throat:     Pharynx: No oropharyngeal exudate.  Eyes:     General: No scleral icterus.    Conjunctiva/sclera: Conjunctivae normal.     Pupils: Pupils are equal, round, and reactive to light.  Neck:     Musculoskeletal: Neck supple.  Cardiovascular:     Rate and Rhythm: Normal rate and regular  rhythm.     Heart sounds: Normal heart sounds. No murmur. No friction rub. No gallop.   Pulmonary:     Effort: Pulmonary effort is normal.     Breath sounds: Normal breath sounds. No wheezing or rales.  Musculoskeletal:     Right lower leg: No edema.  Left lower leg: No edema.  Skin:    General: Skin is warm and dry.  Neurological:     Mental Status: She is alert and oriented to person, place, and time.     No results found for this or any previous visit (from the past 24 hour(s)).  No results found.   ASSESSMENT and PLAN  1. Type 2 diabetes mellitus with hyperglycemia, without long-term current use of insulin ([place]) Checking labs today, medications will be adjusted as needed.  - Hemoglobin A1c - Lipid panel - TSH  2. Ulcerative pancolitis without complication ([place]) Advised patient to reach out to GI  3. Essential hypertension, benign Controlled. Continue current regime.  - Comprehensive metabolic panel  4. Fibromyalgia Restart fibromyalgia  Other orders - magic mouthwash w/lidocaine SOLN; Take 10 mLs by mouth every 2 (two) hours as needed for mouth pain. - triamcinolone cream (KENALOG) 0.1 %; Apply 1 application topically 2 (two) times daily.  Return in about 3 months (around 10/25/2019).    Rutherford Guys, MD [facility] at [place], [place] [address] Ph.  [phone] Fax [phone]

## 2019-07-26 LAB — COMPREHENSIVE METABOLIC PANEL
ALT: 24 IU/L (ref 0–32)
AST: 26 IU/L (ref 0–40)
Albumin/Globulin Ratio: 1.4 (ref 1.2–2.2)
Albumin: 3.9 g/dL (ref 3.8–4.9)
Alkaline Phosphatase: 97 IU/L (ref 39–117)
BUN/Creatinine Ratio: 15 (ref 9–23)
BUN: 11 mg/dL (ref 6–24)
Bilirubin Total: 0.2 mg/dL (ref 0.0–1.2)
CO2: 24 mmol/L (ref 20–29)
Calcium: 9.3 mg/dL (ref 8.7–10.2)
Chloride: 103 mmol/L (ref 96–106)
Creatinine, Ser: 0.75 mg/dL (ref 0.57–1.00)
GFR calc Af Amer: 102 mL/min/{1.73_m2} (ref >59)
GFR calc non Af Amer: 89 mL/min/{1.73_m2} (ref >59)
Globulin, Total: 2.8 g/dL (ref 1.5–4.5)
Glucose: 157 mg/dL — ABNORMAL HIGH (ref 65–99)
Potassium: 4.1 mmol/L (ref 3.5–5.2)
Sodium: 141 mmol/L (ref 134–144)
Total Protein: 6.7 g/dL (ref 6.0–8.5)

## 2019-07-26 LAB — LIPID PANEL
Chol/HDL Ratio: 6.4 ratio — ABNORMAL HIGH (ref 0.0–4.4)
Cholesterol, Total: 231 mg/dL — ABNORMAL HIGH (ref 100–199)
HDL: 36 mg/dL — ABNORMAL LOW (ref >39)
LDL Chol Calc (NIH): 139 mg/dL — ABNORMAL HIGH (ref 0–99)
Triglycerides: 309 mg/dL — ABNORMAL HIGH (ref 0–149)
VLDL Cholesterol Cal: 56 mg/dL — ABNORMAL HIGH (ref 5–40)

## 2019-07-26 LAB — HEMOGLOBIN A1C
Est. average glucose Bld gHb Est-mCnc: 157 mg/dL
Hgb A1c MFr Bld: 7.1 % — ABNORMAL HIGH (ref 4.8–5.6)

## 2019-07-26 LAB — TSH: TSH: 1.26 u[IU]/mL (ref 0.450–4.500)

## 2019-07-28 ENCOUNTER — Other Ambulatory Visit: Payer: Self-pay | Admitting: Family Medicine

## 2019-07-28 MED ORDER — ATORVASTATIN CALCIUM 20 MG PO TABS: 20.0000 mg | ORAL_TABLET | Freq: Every day | ORAL | 3 refills | Status: DC

## 2019-08-03 ENCOUNTER — Other Ambulatory Visit: Payer: Self-pay

## 2019-08-03 ENCOUNTER — Encounter (HOSPITAL_COMMUNITY): Payer: Self-pay | Admitting: Emergency Medicine

## 2019-08-03 ENCOUNTER — Emergency Department (HOSPITAL_COMMUNITY)
Admission: EM | Admit: 2019-08-03 | Discharge: 2019-08-04 | Disposition: A | Discharge status: Home / Self Care | Payer: Medicare Other | Attending: Emergency Medicine | Admitting: Emergency Medicine

## 2019-08-03 ENCOUNTER — Telehealth (INDEPENDENT_AMBULATORY_CARE_PROVIDER_SITE_OTHER): Payer: Medicare Other | Admitting: Family Medicine

## 2019-08-03 DIAGNOSIS — R519 Headache, unspecified: Secondary | ICD-10-CM

## 2019-08-03 DIAGNOSIS — R112 Nausea with vomiting, unspecified: Secondary | ICD-10-CM

## 2019-08-03 DIAGNOSIS — G43909 Migraine, unspecified, not intractable, without status migrainosus: Secondary | ICD-10-CM | POA: Insufficient documentation

## 2019-08-03 DIAGNOSIS — R1110 Vomiting, unspecified: Secondary | ICD-10-CM | POA: Diagnosis present

## 2019-08-03 DIAGNOSIS — Z79899 Other long term (current) drug therapy: Secondary | ICD-10-CM | POA: Diagnosis not present

## 2019-08-03 DIAGNOSIS — R42 Dizziness and giddiness: Secondary | ICD-10-CM

## 2019-08-03 DIAGNOSIS — Z794 Long term (current) use of insulin: Secondary | ICD-10-CM | POA: Diagnosis not present

## 2019-08-03 DIAGNOSIS — Z87891 Personal history of nicotine dependence: Secondary | ICD-10-CM | POA: Insufficient documentation

## 2019-08-03 DIAGNOSIS — Z91040 Latex allergy status: Secondary | ICD-10-CM | POA: Insufficient documentation

## 2019-08-03 LAB — CBC WITH DIFFERENTIAL/PLATELET
Abs Immature Granulocytes: 0.05 10*3/uL (ref 0.00–0.07)
Basophils Absolute: 0.1 10*3/uL (ref 0.0–0.1)
Basophils Relative: 1 %
Eosinophils Absolute: 1 10*3/uL — ABNORMAL HIGH (ref 0.0–0.5)
Eosinophils Relative: 8 %
HCT: 35.1 % — ABNORMAL LOW (ref 36.0–46.0)
Hemoglobin: 11.7 g/dL — ABNORMAL LOW (ref 12.0–15.0)
Immature Granulocytes: 0 %
Lymphocytes Relative: 41 %
Lymphs Abs: 4.8 10*3/uL — ABNORMAL HIGH (ref 0.7–4.0)
MCH: 28.3 pg (ref 26.0–34.0)
MCHC: 33.3 g/dL (ref 30.0–36.0)
MCV: 84.8 fL (ref 80.0–100.0)
Monocytes Absolute: 0.9 10*3/uL (ref 0.1–1.0)
Monocytes Relative: 7 %
Neutro Abs: 4.9 10*3/uL (ref 1.7–7.7)
Neutrophils Relative %: 43 %
Platelets: 342 10*3/uL (ref 150–400)
RBC: 4.14 MIL/uL (ref 3.87–5.11)
RDW: 12.3 % (ref 11.5–15.5)
WBC: 11.6 10*3/uL — ABNORMAL HIGH (ref 4.0–10.5)
nRBC: 0 % (ref 0.0–0.2)

## 2019-08-03 LAB — COMPREHENSIVE METABOLIC PANEL
ALT: 25 U/L (ref 0–44)
AST: 26 U/L (ref 15–41)
Albumin: 3.4 g/dL — ABNORMAL LOW (ref 3.5–5.0)
Alkaline Phosphatase: 78 U/L (ref 38–126)
Anion gap: 7 (ref 5–15)
BUN: 11 mg/dL (ref 6–20)
CO2: 29 mmol/L (ref 22–32)
Calcium: 9.3 mg/dL (ref 8.9–10.3)
Chloride: 105 mmol/L (ref 98–111)
Creatinine, Ser: 0.83 mg/dL (ref 0.44–1.00)
GFR calc Af Amer: >60 mL/min (ref >60)
GFR calc non Af Amer: >60 mL/min (ref >60)
Glucose, Bld: 129 mg/dL — ABNORMAL HIGH (ref 70–99)
Potassium: 3.6 mmol/L (ref 3.5–5.1)
Sodium: 141 mmol/L (ref 135–145)
Total Bilirubin: 0.6 mg/dL (ref 0.3–1.2)
Total Protein: 6.9 g/dL (ref 6.5–8.1)

## 2019-08-03 LAB — URINALYSIS, ROUTINE W REFLEX MICROSCOPIC
Bilirubin Urine: NEGATIVE
Glucose, UA: NEGATIVE mg/dL
Hgb urine dipstick: NEGATIVE
Ketones, ur: NEGATIVE mg/dL
Leukocytes,Ua: NEGATIVE
Nitrite: NEGATIVE
Protein, ur: NEGATIVE mg/dL
Specific Gravity, Urine: 1.011 (ref 1.005–1.030)
pH: 7 (ref 5.0–8.0)

## 2019-08-03 MED ORDER — ACETAMINOPHEN 325 MG PO TABS
650.0000 mg | ORAL_TABLET | Freq: Once | ORAL | Status: AC
  Administered 2019-08-03: 650 mg via ORAL

## 2019-08-03 NOTE — ED Triage Notes (Signed)
Patient presents with multiple complaints: "Dizzy Spells" for 4 days , headache for 1 week , denies head injury and emesis x2 yesterday , no fever or chills .

## 2019-08-03 NOTE — Progress Notes (Signed)
CC- dizziness/nausea - been going on for a while now but for the last 2 weeks has been getting worse. Can be standing then will get dizzy out of no where. With a little vision blurriness.  If I lay flat I will get dizzy as well. Have tried dizzy meds before. It helps a little but then still will come back.

## 2019-08-03 NOTE — Progress Notes (Signed)
Virtual Visit via Telephone Note  I connected with Beverly Ramirez on 08/03/19 at 5:58 PM - initial attempt at 1257.  by telephone and verified that I am speaking with the correct person using two identifiers.   I discussed the limitations, risks, security and privacy concerns of performing an evaluation and management service by telephone and the availability of in person appointments. I also discussed with the patient that there may be a patient responsible charge related to this service. The patient expressed understanding and agreed to proceed, consent obtained   Chief complaint: Dizziness  History of Present Illness: Beverly Ramirez is a 58 y.o. female  PCP: Rutherford Guys, MD   Dizziness for months. Similar symptoms few years ago, but mild.  Has been treated with meclizine in past for milder symptoms in past.   Recently has had worsening dizziness, started with posterior headache.  Notices dizziness with leaning head down or up, and room spinning in am. Using special pillow to lessen spinning.  More spinning past few days - acutely worse past few days - now with nausea and vomiting. Able to eat last night. Drinking fluids ok.  Past few days - eyes feel like they are moving fast, some trouble seeing as eyes moving so fast and some blurring due to the movement.  Avoiding driving, limiting some activities. Now having horrible headaches in back of head - started about a week ago. Goes to bed with headache and wakes up with this headache. HA wakes her up in the middle of night. Current HA is worst headache of her life, throbbing headache in back of head to neck - guarded  - does not want to move neck/head.  No phonophobia, slight photophobia.  No fever.   No heart palpitations, no chest pain.   Tx: tylenol arthritis - 2 at a time, minimal improvement of HA.  Meclizine - has not taken recently.   Home BP readings - 132/89. Glucose 211 today.   My chart message sent by  patient regarding the symptoms on November 16 with office visit recommended.  Last visit with Dr. Pamella Pert 9 days ago.  Off blood pressure medications for 3 to 4 weeks at that time.  Home blood sugars less than 130, not requiring insulin, takes Metformin for diabetes. 5 min chart review.   BP Readings from Last 3 Encounters:  07/25/19 110/79  04/26/19 134/86  03/25/19 135/86   Lab Results  Component Value Date   HGBA1C 7.1 (H) 07/25/2019    Ongoing symptoms but states last 2 weeks has been getting worse.  Episodes of dizziness out of nowhere when standing sometimes with some blurry vision.  Dizziness with laying backwards as well.  Has tried meclizine at times with some improvement in symptoms return.    Patient Active Problem List   Diagnosis Date Noted  . De Quervain's tenosynovitis, left 11/16/2018  . Bilateral carpal tunnel syndrome 04/13/2018  . Leukocytosis 10/04/2017  . Hyperlipidemia 08/04/2017  . Ulcerative colitis without complications ([place]) 29/51/8841  . Plantar fasciitis 05/28/2017   Past Medical History:  Diagnosis Date  . Anemia   . Carpal tunnel syndrome    bilateral  . Chronic heel pain   . DJD (degenerative joint disease) of cervical spine    MRI 2017  . Hyperlipidemia   . Plantar fasciitis    bilateral  . Tendonitis    left hand/wrist  . UC (ulcerative colitis) ([place])    Past Surgical History:  Procedure Laterality Date  .  ABDOMINAL HYSTERECTOMY    . APPENDECTOMY    . CESAREAN SECTION     x4  . COLONOSCOPY     Multiple in [place]  . ESOPHAGOGASTRODUODENOSCOPY     Allergies  Allergen Reactions  . Codeine Itching, Rash and Hives  . Latex Hives    With the power   Prior to Admission medications   Medication Sig Start Date End Date Taking? Authorizing Provider  atorvastatin (LIPITOR) 20 MG tablet Take 1 tablet (20 mg total) by mouth daily. 07/28/19  Yes Rutherford Guys, MD  blood glucose meter kit and supplies KIT Based on patient's  insurance. Check twice a day. Dx R73.9, Z79.52 09/24/18  Yes Rutherford Guys, MD  DULoxetine (CYMBALTA) 30 MG capsule Take 1 capsule (30 mg total) by mouth daily. 04/26/19  Yes Rutherford Guys, MD  fluticasone [facility]) 50 MCG/ACT nasal spray Place 2 sprays into both nostrils daily. 12/23/18  Yes Jacqlyn Larsen, PA-C  Insulin Glargine (LANTUS SOLOSTAR) 100 UNIT/ML Solostar Pen Inject 20 Units into the skin at bedtime. 11/11/18  Yes Lamptey, Myrene Galas, MD  Insulin Syringe-Needle U-100 (INSULIN SYRINGE 1CC/31GX5/16") 31G X 5/16" 1 ML MISC Inject 0.08 ( 8 Units)  into the skin 3 times daily before meals. 10/16/18  Yes Rutherford Guys, MD  Lancets (ONETOUCH DELICA PLUS [id number]) [place] 1 each by Does not apply route 3 (three) times daily. Dx E11.65, z79.4 01/24/19  Yes Rutherford Guys, MD  magic mouthwash w/lidocaine SOLN Take 10 mLs by mouth every 2 (two) hours as needed for mouth pain. 07/25/19  Yes Rutherford Guys, MD  mesalamine (LIALDA) 1.2 g EC tablet Take 2 tablets (2.4 g total) by mouth 2 (two) times daily. 11/25/18 11/20/19 Yes Gatha Mayer, MD  metFORMIN (GLUCOPHAGE) 500 MG tablet TAKE 1 TABLET BY MOUTH TWICE DAILY FOR TWO WEEKS. THEN TAKE 2 TABLETS BY MOUTH TWICE DAILY. 02/22/19  Yes Rutherford Guys, MD  NOVOLIN R 100 UNIT/ML injection INJECT 8 UNITS INTO THE SKIN 3 TIMES DAILY BEFORE MEALS. 03/20/19  Yes Rutherford Guys, MD  [facility] VERIO test strip USE TO CHECK BLOOD SUGAR TWICE A DAY 05/15/19  Yes Rutherford Guys, MD  triamcinolone cream (KENALOG) 0.1 % Apply 1 application topically 2 (two) times daily. 07/25/19  Yes Rutherford Guys, MD   Social History   Socioeconomic History  . Marital status: Married    Spouse name: Simona Huh  . Number of children: 4  . Years of education: Not on file  . Highest education level: Not on file  Occupational History  . Occupation: retired    Comment: He formerly worked for [employer], disabled due to a fall  Social Needs  .  Financial resource strain: Not on file  . Food insecurity    Worry: Not on file    Inability: Not on file  . Transportation needs    Medical: Not on file    Non-medical: Not on file  Tobacco Use  . Smoking status: Former Smoker    Types: Cigarettes    Quit date: 10/13/2006    Years since quitting: 12.8  . Smokeless tobacco: Never Used  Substance and Sexual Activity  . Alcohol use: Yes    Comment: occasional  . Drug use: No  . Sexual activity: Yes    Partners: Male    Comment: 1ST intercourse- 35, partners- 51, married- 16 yrs   Lifestyle  . Physical activity  Days per week: Not on file    Minutes per session: Not on file  . Stress: Not on file  Relationships  . Social Herbalist on phone: Not on file    Gets together: Not on file    Attends religious service: Not on file    Active member of club or organization: Not on file    Attends meetings of clubs or organizations: Not on file    Relationship status: Not on file  . Intimate partner violence    Fear of current or ex partner: Not on file    Emotionally abused: Not on file    Physically abused: Not on file    Forced sexual activity: Not on file  Other Topics Concern  . Not on file  Social History Narrative   She is married, 3 sons born 1979, 1981, 1984.  One daughter born in 74.   She is medically retired from the [employer] after a fall and a hip injury, around 2004.   Caffeinated beverage every other day x1   07/13/2017     Observations/Objective: Speaking full sentences, no distress.  Intelligible speech. Home BP readings - 132/89. Glucose 211 today.  Assessment and Plan: Worst headache of life  Dizziness  Vertigo  Nausea and vomiting, intractability of vomiting not specified, unspecified vomiting type  Episodic vertigo in the past.  Recent onset of posterior headache, progressive dizziness, vertigo symptoms and worsening past few days.  Does report this being the worst  headache of her life and having some neck pain, posterior headache with guarding of movement.  Minimal photophobia, no phonophobia.  -Based on progressive symptoms as above, worst headache of her life, differential includes more concerning causes of vertigo/central vertigo including posterior circulation CVA, less likely meningitis, tumor.   -Recommend emergency room evaluation for further assessment and decision on neuroimaging or further testing.  Plans to be seen tonight.  Follow Up Instructions: To ER for eval.    I discussed the assessment and treatment plan with the patient. The patient was provided an opportunity to ask questions and all were answered. The patient agreed with the plan and demonstrated an understanding of the instructions.   The patient was advised to call back or seek an in-person evaluation if the symptoms worsen or if the condition fails to improve as anticipated.  I provided 18 minutes of non-face-to-face time during this encounter.  Signed,   Merri Ray, MD Primary [facility] at [facility].  08/03/19

## 2019-08-04 ENCOUNTER — Emergency Department (HOSPITAL_COMMUNITY): Payer: Medicare Other

## 2019-08-04 DIAGNOSIS — G43909 Migraine, unspecified, not intractable, without status migrainosus: Secondary | ICD-10-CM | POA: Diagnosis not present

## 2019-08-04 DIAGNOSIS — R42 Dizziness and giddiness: Secondary | ICD-10-CM | POA: Diagnosis not present

## 2019-08-04 IMAGING — CT CT HEAD W/O CM
4 series · 17 of 47 positions shown, 19 images · non-contrast
Comparison: None.

CLINICAL DATA: Dizziness, nausea, and vision blurriness, worsening
in the last 2 weeks. Hx anemia.Neuro deficit(s), subacute

EXAM:
CT HEAD WITHOUT CONTRAST
TECHNIQUE: Contiguous axial images were obtained from the base of the skull
through the vertex without intravenous contrast.

[Series 3: head without · axial · non-contrast · 0.43mm/px · z∈[-124,-4]mm · 7 of 34 slices shown, 9 images]
[im 5/34  brain]
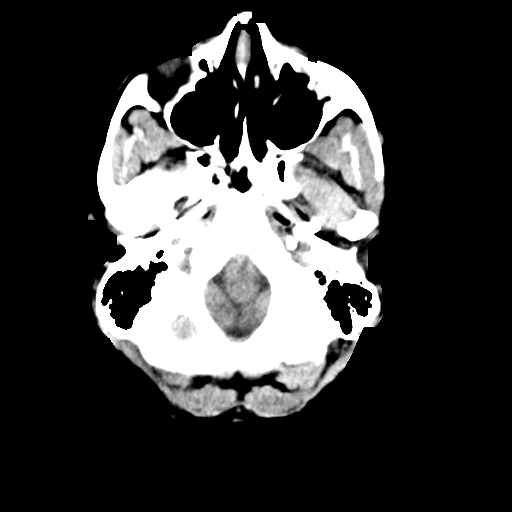
[im 5/34  bone]
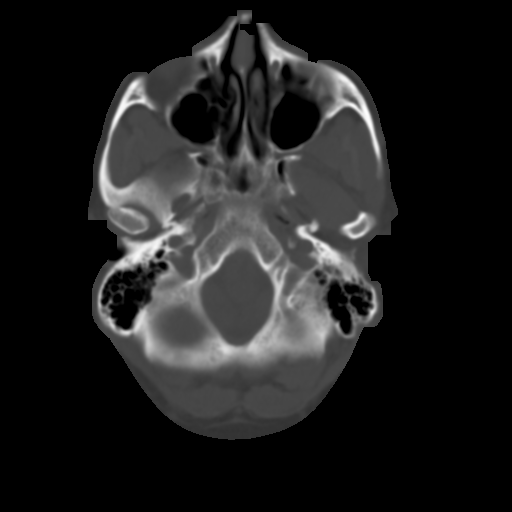
[im 9/34  brain]
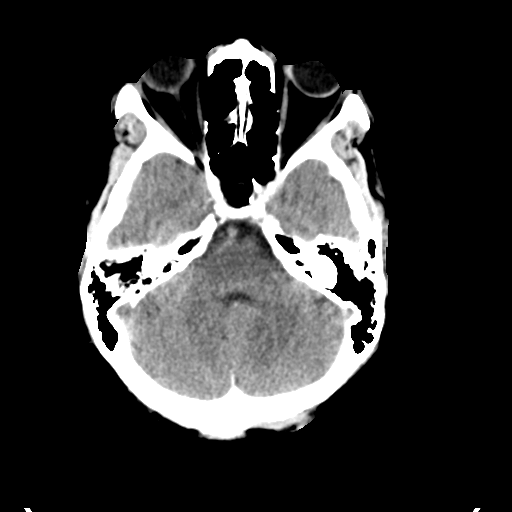
[im 13/34  brain]
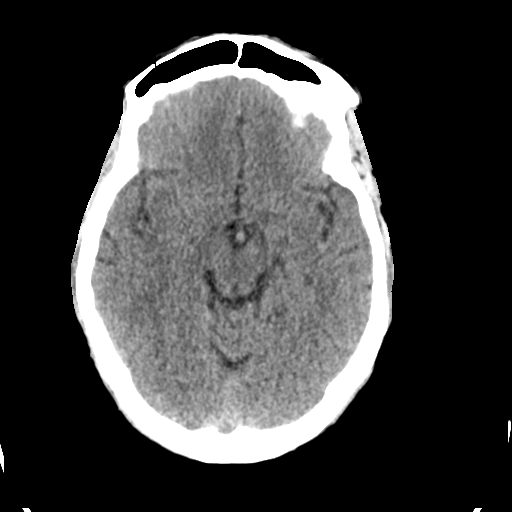
[im 17/34  brain]
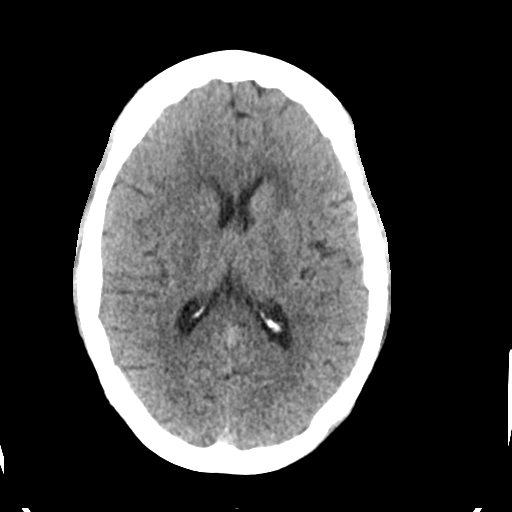
[im 21/34  brain]
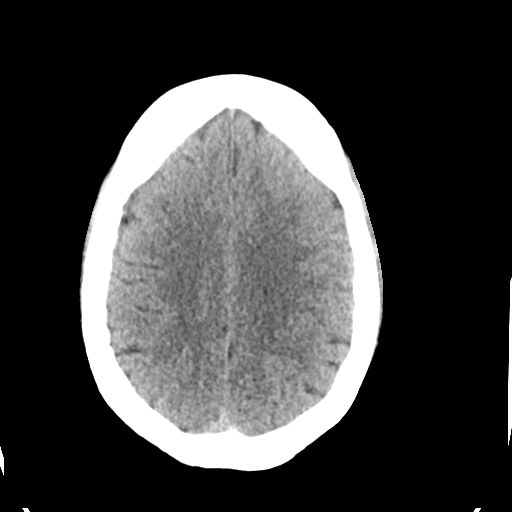
[im 21/34  bone]
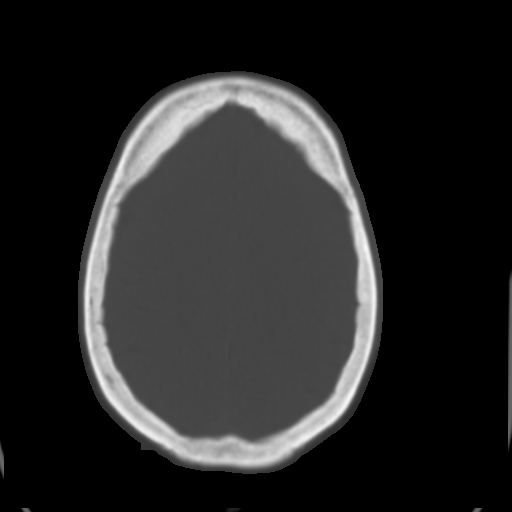
[im 25/34  brain]
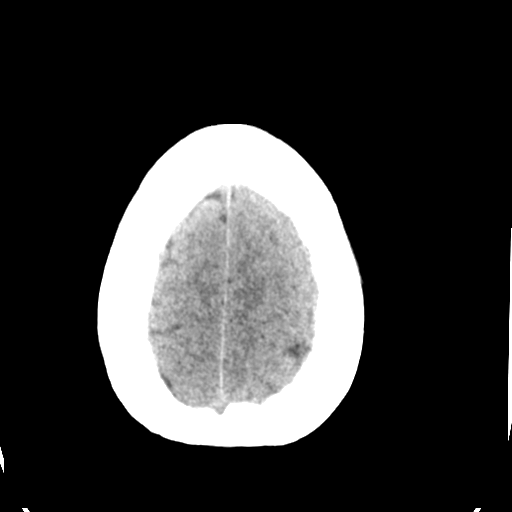
[im 29/34  brain]
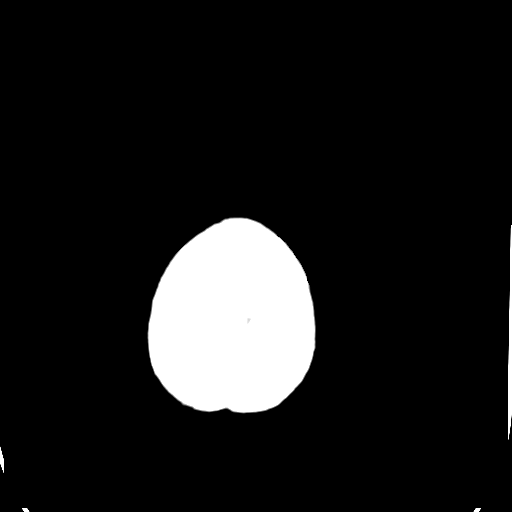

[Series 4: head bone · axial · 0.43mm/px · z∈[-128,-70]mm · 4 of 85 slices shown]
[im 9/85  bone]
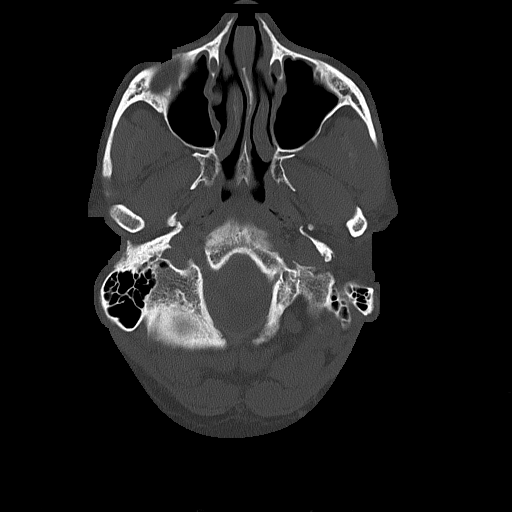
[im 17/85  bone]
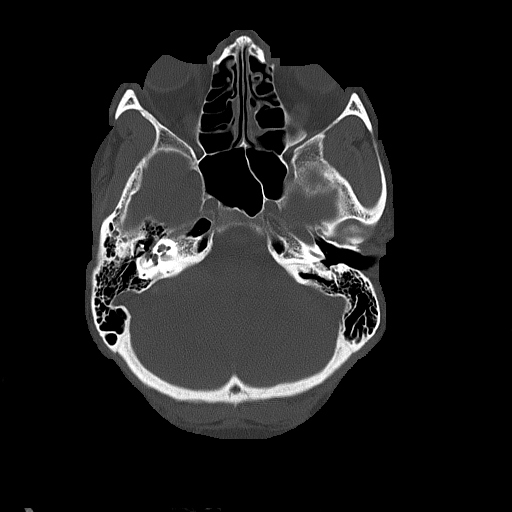
[im 26/85  bone]
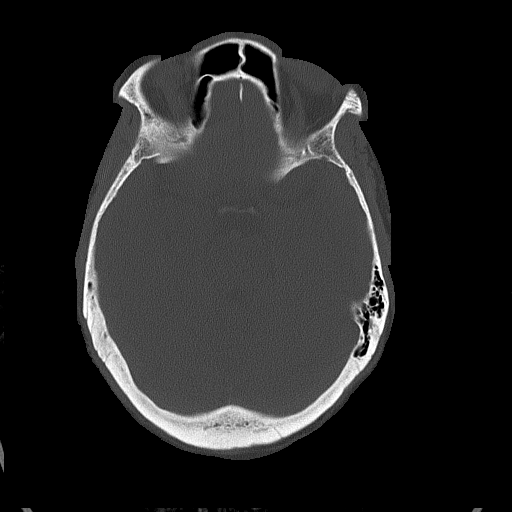
[im 38/85  bone]
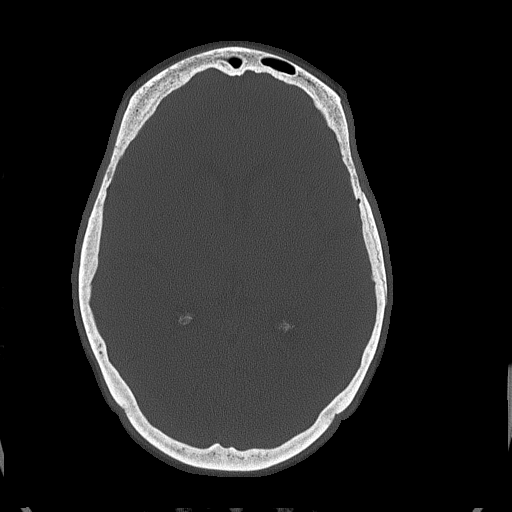

[Series 5: head without cor · coronal · non-contrast · 0.32mm/px · 3 of 70 slices shown]
[im 24/70  brain]
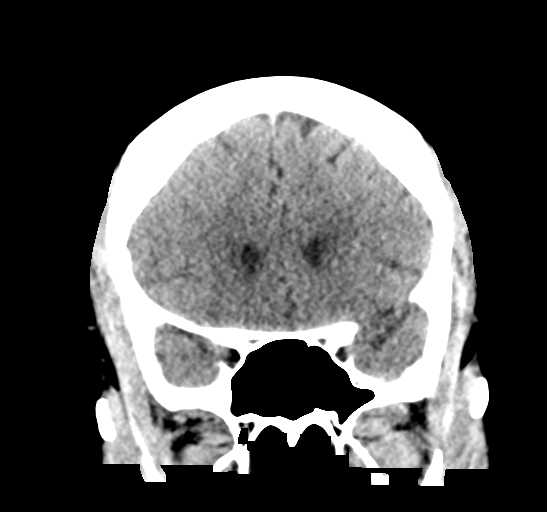
[im 31/70  brain]
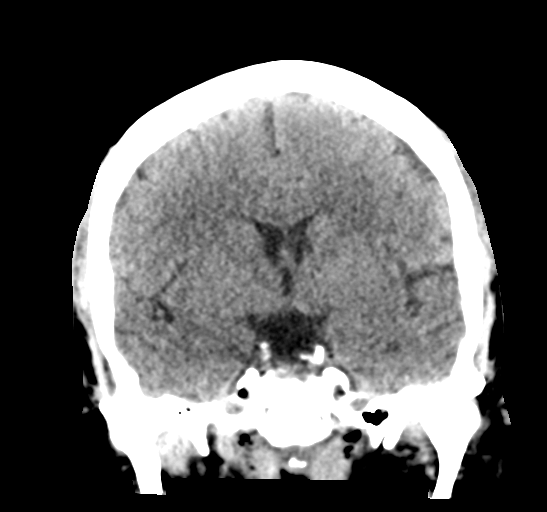
[im 39/70  brain]
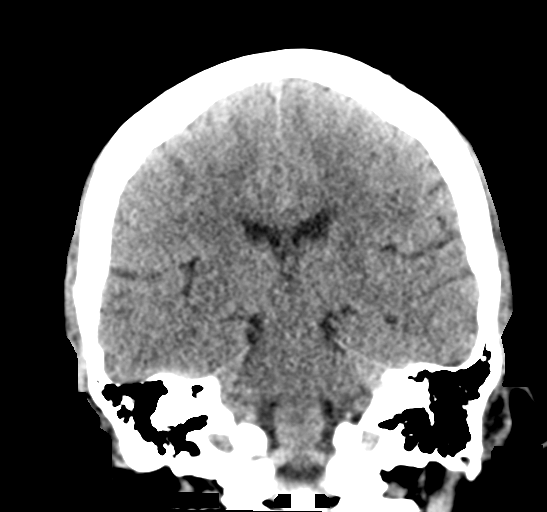

[Series 6: head without sag · sagittal · non-contrast · 0.35mm/px · 3 of 59 slices shown]
[im 20/59  brain]
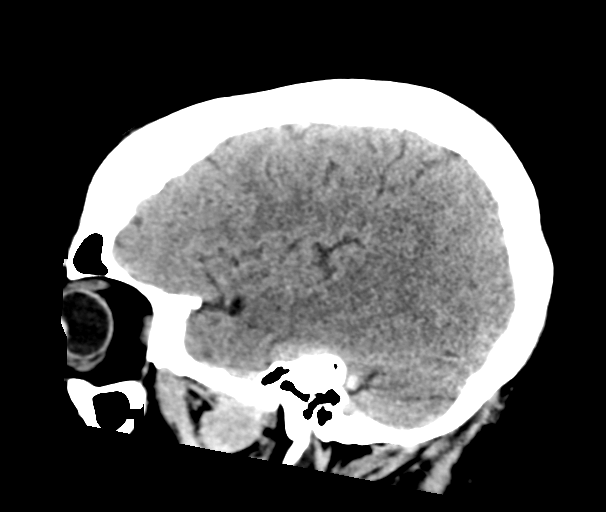
[im 30/59  brain]
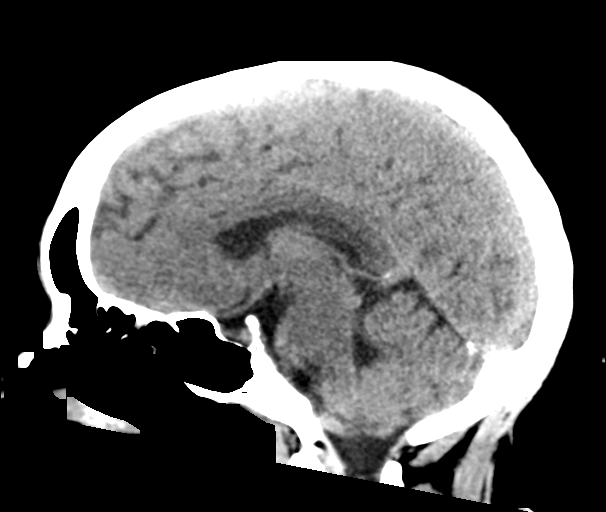
[im 39/59  brain]
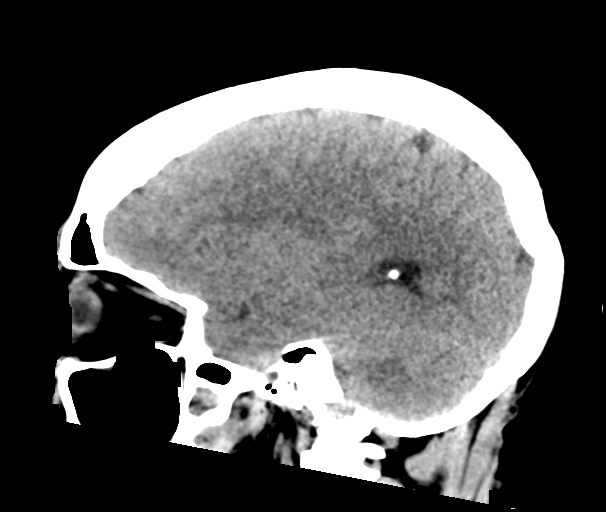

[17 of 47 positions shown; findings below may reference images not displayed]

FINDINGS: Brain: No acute intracranial hemorrhage. No focal mass lesion. No CT
evidence of acute infarction. No midline shift or mass effect. No
hydrocephalus. Basilar cisterns are patent.

Vascular: No hyperdense vessel or unexpected calcification.

Skull: Normal. Negative for fracture or focal lesion.

Sinuses/Orbits: Paranasal sinuses and mastoid air cells are clear.
Orbits are clear.

Other: None.
IMPRESSION: Normal head CT.

## 2019-08-04 MED ORDER — MELOXICAM 7.5 MG PO TABS: 7.5000 mg | ORAL_TABLET | Freq: Every day | ORAL | 0 refills | Status: DC | PRN

## 2019-08-04 MED ORDER — MECLIZINE HCL 25 MG PO TABS: 25.0000 mg | ORAL_TABLET | Freq: Three times a day (TID) | ORAL | 0 refills | Status: DC | PRN

## 2019-08-04 MED ORDER — KETOROLAC TROMETHAMINE 60 MG/2ML IM SOLN
30.0000 mg | Freq: Once | INTRAMUSCULAR | Status: AC
  Administered 2019-08-04: 30 mg via INTRAMUSCULAR
  Filled 2019-08-04: qty 2

## 2019-08-04 MED ORDER — MECLIZINE HCL 25 MG PO TABS
25.0000 mg | ORAL_TABLET | Freq: Once | ORAL | Status: AC
  Administered 2019-08-04: 25 mg via ORAL
  Filled 2019-08-04: qty 1

## 2019-08-04 NOTE — ED Provider Notes (Signed)
[facility] EMERGENCY DEPARTMENT Provider Note   CSN: [id number] Arrival date & time: 08/03/19  2033     History   Chief Complaint Chief Complaint  Patient presents with  . Emesis  . Headache    HPI Beverly Ramirez is a 58 y.o. female.      Emesis Severity:  Mild Timing:  Intermittent Progression:  Unchanged Chronicity:  New Recent urination:  Normal Ineffective treatments:  None tried Associated symptoms: headaches   Headache Associated symptoms: vomiting     Past Medical History:  Diagnosis Date  . Anemia   . Carpal tunnel syndrome    bilateral  . Chronic heel pain   . DJD (degenerative joint disease) of cervical spine    MRI 2017  . Hyperlipidemia   . Plantar fasciitis    bilateral  . Tendonitis    left hand/wrist  . UC (ulcerative colitis) [facility])     Patient Active Problem List   Diagnosis Date Noted  . De Quervain's tenosynovitis, left 11/16/2018  . Bilateral carpal tunnel syndrome 04/13/2018  . Leukocytosis 10/04/2017  . Hyperlipidemia 08/04/2017  . Ulcerative colitis without complications ([place]) 78/29/5621  . Plantar fasciitis 05/28/2017    Past Surgical History:  Procedure Laterality Date  . ABDOMINAL HYSTERECTOMY    . APPENDECTOMY    . CESAREAN SECTION     x4  . COLONOSCOPY     Multiple in [place]  . ESOPHAGOGASTRODUODENOSCOPY       OB History    Gravida  4   Para  4   Term      Preterm      AB      Living  4     SAB      TAB      Ectopic      Multiple      Live Births               Home Medications    Prior to Admission medications   Medication Sig Start Date End Date Taking? Authorizing Provider  atorvastatin (LIPITOR) 20 MG tablet Take 1 tablet (20 mg total) by mouth daily. 07/28/19   Rutherford Guys, MD  blood glucose meter kit and supplies KIT Based on patient's insurance. Check twice a day. Dx R73.9, Z79.52 09/24/18   Rutherford Guys, MD  DULoxetine (CYMBALTA) 30 MG  capsule Take 1 capsule (30 mg total) by mouth daily. 04/26/19   Rutherford Guys, MD  fluticasone (FLONASE) 50 MCG/ACT nasal spray Place 2 sprays into both nostrils daily. 12/23/18   Jacqlyn Larsen, PA-C  Insulin Glargine (LANTUS SOLOSTAR) 100 UNIT/ML Solostar Pen Inject 20 Units into the skin at bedtime. 11/11/18   LampteyMyrene Galas, MD  Insulin Syringe-Needle U-100 (INSULIN SYRINGE 1CC/31GX5/16") 31G X 5/16" 1 ML MISC Inject 0.08 ( 8 Units)  into the skin 3 times daily before meals. 10/16/18   Rutherford Guys, MD  Lancets [facility] DELICA PLUS [id number]) MISC 1 each by Does not apply route 3 (three) times daily. Dx E11.65, z79.4 01/24/19   Rutherford Guys, MD  magic mouthwash w/lidocaine SOLN Take 10 mLs by mouth every 2 (two) hours as needed for mouth pain. 07/25/19   Rutherford Guys, MD  meclizine (ANTIVERT) 25 MG tablet Take 1 tablet (25 mg total) by mouth 3 (three) times daily as needed for dizziness. 08/04/19   Gurvir Schrom, Corene Cornea, MD  meloxicam (MOBIC) 7.5 MG tablet Take 1 tablet (7.5 mg total) by mouth  daily as needed for pain (headache). 08/04/19   Sholonda Jobst, Corene Cornea, MD  mesalamine (LIALDA) 1.2 g EC tablet Take 2 tablets (2.4 g total) by mouth 2 (two) times daily. 11/25/18 11/20/19  Gatha Mayer, MD  metFORMIN (GLUCOPHAGE) 500 MG tablet TAKE 1 TABLET BY MOUTH TWICE DAILY FOR TWO WEEKS. THEN TAKE 2 TABLETS BY MOUTH TWICE DAILY. 02/22/19   Rutherford Guys, MD  NOVOLIN R 100 UNIT/ML injection INJECT 8 UNITS INTO THE SKIN 3 TIMES DAILY BEFORE MEALS. 03/20/19   Rutherford Guys, MD  [facility] VERIO test strip USE TO CHECK BLOOD SUGAR TWICE A DAY 05/15/19   Rutherford Guys, MD  triamcinolone cream (KENALOG) 0.1 % Apply 1 application topically 2 (two) times daily. 07/25/19   Rutherford Guys, MD    Family History Family History  Problem Relation Age of Onset  . Stroke Mother   . Diabetes Mother   . Hypertension Mother   . Lung cancer Mother   . Sickle cell trait Mother   . Breast cancer Maternal Aunt   .  Diabetes Maternal Aunt   . Lung cancer Maternal Aunt   . Lung cancer Maternal Aunt   . Lung cancer Maternal Aunt   . Colon cancer Cousin 20  . Sickle cell trait Father   . Sickle cell anemia Sister 14  . Lung cancer Maternal Grandmother   . Sickle cell anemia Sister   . Other Sister        accident and blood clot formed    Social History Social History   Tobacco Use  . Smoking status: Former Smoker    Types: Cigarettes    Quit date: 10/13/2006    Years since quitting: 12.8  . Smokeless tobacco: Never Used  Substance Use Topics  . Alcohol use: Yes    Comment: occasional  . Drug use: No     Allergies   Codeine and Latex   Review of Systems Review of Systems  Gastrointestinal: Positive for vomiting.  Neurological: Positive for headaches.  All other systems reviewed and are negative.    Physical Exam Updated Vital Signs BP 130/77   Pulse (!) 56   Temp 97.8 F (36.6 C) (Oral)   Resp 16   SpO2 97%   Physical Exam Vitals signs and nursing note reviewed.  Constitutional:      Appearance: She is well-developed.  HENT:     Head: Normocephalic and atraumatic.  Eyes:     Extraocular Movements: Extraocular movements intact.  Neck:     Musculoskeletal: Normal range of motion.  Cardiovascular:     Rate and Rhythm: Normal rate and regular rhythm.  Pulmonary:     Effort: Pulmonary effort is normal. No respiratory distress.     Breath sounds: No stridor.  Abdominal:     General: There is no distension.     Palpations: Abdomen is soft.  Musculoskeletal: Normal range of motion.        General: No swelling or tenderness.  Skin:    General: Skin is warm and dry.  Neurological:     Mental Status: She is alert.     Cranial Nerves: No cranial nerve deficit or dysarthria.  Psychiatric:        Mood and Affect: Mood normal.      ED Treatments / Results  Labs (all labs ordered are listed, but only abnormal results are displayed) Labs Reviewed  CBC WITH  DIFFERENTIAL/PLATELET - Abnormal; Notable for the following components:  Result Value   WBC 11.6 (*)    Hemoglobin 11.7 (*)    HCT 35.1 (*)    Lymphs Abs 4.8 (*)    Eosinophils Absolute 1.0 (*)    All other components within normal limits  COMPREHENSIVE METABOLIC PANEL - Abnormal; Notable for the following components:   Glucose, Bld 129 (*)    Albumin 3.4 (*)    All other components within normal limits  URINALYSIS, ROUTINE W REFLEX MICROSCOPIC    EKG None  Radiology Ct Head Wo Contrast  Result Date: 08/04/2019 CLINICAL DATA:  Dizziness, nausea, and vision blurriness, worsening in the last 2 weeks. Hx anemia.Neuro deficit(s), subacute EXAM: CT HEAD WITHOUT CONTRAST TECHNIQUE: Contiguous axial images were obtained from the base of the skull through the vertex without intravenous contrast. COMPARISON:  None. FINDINGS: Brain: No acute intracranial hemorrhage. No focal mass lesion. No CT evidence of acute infarction. No midline shift or mass effect. No hydrocephalus. Basilar cisterns are patent. Vascular: No hyperdense vessel or unexpected calcification. Skull: Normal. Negative for fracture or focal lesion. Sinuses/Orbits: Paranasal sinuses and mastoid air cells are clear. Orbits are clear. Other: None. IMPRESSION: Normal head CT. Electronically Signed   By: Suzy Bouchard M.D.   On: 08/04/2019 04:57    Procedures Procedures (including critical care time)  Medications Ordered in ED Medications  acetaminophen (TYLENOL) tablet 650 mg (650 mg Oral Given 08/03/19 2306)  meclizine (ANTIVERT) tablet 25 mg (25 mg Oral Given 08/04/19 0428)  ketorolac (TORADOL) injection 30 mg (30 mg Intramuscular Given 08/04/19 0427)     Initial Impression / Assessment and Plan / ED Course  I have reviewed the triage vital signs and the nursing notes.  Pertinent labs & imaging results that were available during my care of the patient were reviewed by me and considered in my medical decision making  (see chart for details).  Vertigo with nausea.  Headache as well.  CT scan done to evaluate for posterior mass as this is been going on for over a month.  CT was negative.  Vertigo and nausea has improved.  Patient stable for discharge with PCP follow-up at this time  Final Clinical Impressions(s) / ED Diagnoses   Final diagnoses:  Nonintractable headache, unspecified chronicity pattern, unspecified headache type  Vertigo    ED Discharge Orders         Ordered    meclizine (ANTIVERT) 25 MG tablet  3 times daily PRN     08/04/19 0646    meloxicam (MOBIC) 7.5 MG tablet  Daily PRN     08/04/19 0647           Lajean Boese, Corene Cornea, MD 08/04/19 9093

## 2019-08-08 ENCOUNTER — Telehealth (INDEPENDENT_AMBULATORY_CARE_PROVIDER_SITE_OTHER): Payer: Medicare Other | Admitting: Family Medicine

## 2019-08-08 ENCOUNTER — Other Ambulatory Visit: Payer: Self-pay

## 2019-08-08 DIAGNOSIS — R05 Cough: Secondary | ICD-10-CM

## 2019-08-08 DIAGNOSIS — R509 Fever, unspecified: Secondary | ICD-10-CM | POA: Diagnosis not present

## 2019-08-08 DIAGNOSIS — R059 Cough, unspecified: Secondary | ICD-10-CM

## 2019-08-08 MED ORDER — PROMETHAZINE-DM 6.25-15 MG/5ML PO SYRP: 5.0000 mL | ORAL_SOLUTION | Freq: Four times a day (QID) | ORAL | 0 refills | Status: DC | PRN

## 2019-08-08 MED ORDER — AZITHROMYCIN 250 MG PO TABS: ORAL_TABLET | ORAL | 0 refills | Status: AC

## 2019-08-08 NOTE — Progress Notes (Signed)
Virtual Visit Note  I connected with patient on 08/08/19 at 544pm by phone and verified that I am speaking with the correct person using two identifiers. Beverly Ramirez is currently located at home and patient is currently with them during visit. The provider, Rutherford Guys, MD is located in their office at time of visit.  I discussed the limitations, risks, security and privacy concerns of performing an evaluation and management service by telephone and the availability of in person appointments. I also discussed with the patient that there may be a patient responsible charge related to this service. The patient expressed understanding and agreed to proceed.   CC: ER followup  HPI ? Seen in ER nov 25th headache and dizziness- normal CT head and labs tx with meclizine and meloxicam HA started about a week Since then developed URI sx and fevers Having cough, chest congestion, burning with breathing, fever and chills Starting to feel achy today, having mild SOB Currently not on prednisone Daughter had a cold 2 weeks ago, tested neg for covid Her husband started feeling sick yesterday  Allergies  Allergen Reactions  . Codeine Itching, Rash and Hives  . Latex Hives    With the power    Prior to Admission medications   Medication Sig Start Date End Date Taking? Authorizing Provider  atorvastatin (LIPITOR) 20 MG tablet Take 1 tablet (20 mg total) by mouth daily. 07/28/19  Yes Rutherford Guys, MD  blood glucose meter kit and supplies KIT Based on patient's insurance. Check twice a day. Dx R73.9, Z79.52 09/24/18  Yes Rutherford Guys, MD  DULoxetine (CYMBALTA) 30 MG capsule Take 1 capsule (30 mg total) by mouth daily. 04/26/19  Yes Rutherford Guys, MD  fluticasone [facility]) 50 MCG/ACT nasal spray Place 2 sprays into both nostrils daily. 12/23/18  Yes Jacqlyn Larsen, PA-C  Insulin Glargine (LANTUS SOLOSTAR) 100 UNIT/ML Solostar Pen Inject 20 Units into the skin at bedtime. 11/11/18   Yes Lamptey, Myrene Galas, MD  Insulin Syringe-Needle U-100 (INSULIN SYRINGE 1CC/31GX5/16") 31G X 5/16" 1 ML MISC Inject 0.08 ( 8 Units)  into the skin 3 times daily before meals. 10/16/18  Yes Rutherford Guys, MD  Lancets (ONETOUCH DELICA PLUS [id number]) [place] 1 each by Does not apply route 3 (three) times daily. Dx E11.65, z79.4 01/24/19  Yes Rutherford Guys, MD  magic mouthwash w/lidocaine SOLN Take 10 mLs by mouth every 2 (two) hours as needed for mouth pain. 07/25/19  Yes Rutherford Guys, MD  meclizine (ANTIVERT) 25 MG tablet Take 1 tablet (25 mg total) by mouth 3 (three) times daily as needed for dizziness. 08/04/19  Yes Mesner, Corene Cornea, MD  meloxicam (MOBIC) 7.5 MG tablet Take 1 tablet (7.5 mg total) by mouth daily as needed for pain (headache). 08/04/19  Yes Mesner, Corene Cornea, MD  mesalamine (LIALDA) 1.2 g EC tablet Take 2 tablets (2.4 g total) by mouth 2 (two) times daily. 11/25/18 11/20/19 Yes Gatha Mayer, MD  metFORMIN (GLUCOPHAGE) 500 MG tablet TAKE 1 TABLET BY MOUTH TWICE DAILY FOR TWO WEEKS. THEN TAKE 2 TABLETS BY MOUTH TWICE DAILY. 02/22/19  Yes Rutherford Guys, MD  NOVOLIN R 100 UNIT/ML injection INJECT 8 UNITS INTO THE SKIN 3 TIMES DAILY BEFORE MEALS. 03/20/19  Yes Rutherford Guys, MD  [facility] VERIO test strip USE TO CHECK BLOOD SUGAR TWICE A DAY 05/15/19  Yes Rutherford Guys, MD  triamcinolone cream (KENALOG) 0.1 % Apply 1 application topically 2 (two) times daily.  07/25/19  Yes Rutherford Guys, MD    Past Medical History:  Diagnosis Date  . Anemia   . Carpal tunnel syndrome    bilateral  . Chronic heel pain   . DJD (degenerative joint disease) of cervical spine    MRI 2017  . Hyperlipidemia   . Plantar fasciitis    bilateral  . Tendonitis    left hand/wrist  . UC (ulcerative colitis) ([place])     Past Surgical History:  Procedure Laterality Date  . ABDOMINAL HYSTERECTOMY    . APPENDECTOMY    . CESAREAN SECTION     x4  . COLONOSCOPY     Multiple in [place]  .  ESOPHAGOGASTRODUODENOSCOPY      Social History   Tobacco Use  . Smoking status: Former Smoker    Types: Cigarettes    Quit date: 10/13/2006    Years since quitting: 12.8  . Smokeless tobacco: Never Used  Substance Use Topics  . Alcohol use: Yes    Comment: occasional    Family History  Problem Relation Age of Onset  . Stroke Mother   . Diabetes Mother   . Hypertension Mother   . Lung cancer Mother   . Sickle cell trait Mother   . Breast cancer Maternal Aunt   . Diabetes Maternal Aunt   . Lung cancer Maternal Aunt   . Lung cancer Maternal Aunt   . Lung cancer Maternal Aunt   . Colon cancer Cousin 40  . Sickle cell trait Father   . Sickle cell anemia Sister 72  . Lung cancer Maternal Grandmother   . Sickle cell anemia Sister   . Other Sister        accident and blood clot formed    ROS Per hpi  Objective  Vitals as reported by the patient: none  Patient AAOx3, NAD Coughing thru most of visit but still able to speak in sentences   ASSESSMENT and PLAN  1. Fever, unspecified 2. Cough Discussed most likely viral given other family members sick, however immunosuppressed and worsening. Treating for CAP empirically, covid testing pending. Strict ER precautions given. Discussed quarantine recommendations - Novel Coronavirus, NAA ([facility])   Other orders - promethazine-dextromethorphan (PROMETHAZINE-DM) 6.25-15 MG/5ML syrup; Take 5 mLs by mouth 4 (four) times daily as needed for cough. - azithromycin (ZITHROMAX) 250 MG tablet; Take 2 tablets (500 mg total) by mouth daily for 1 day, THEN 1 tablet (250 mg total) daily for 4 days.  FOLLOW-UP: 2-3 days   The above assessment and management plan was discussed with the patient. The patient verbalized understanding of and has agreed to the management plan. Patient is aware to call the clinic if symptoms persist or worsen. Patient is aware when to return to the clinic for a follow-up visit. Patient educated on when it is  appropriate to go to the emergency department.    I provided 16 minutes of non-face-to-face time during this encounter.  Rutherford Guys, MD Primary Care at [facility] [place], [place] [address] Ph.  [phone] Fax [phone]

## 2019-08-08 NOTE — Progress Notes (Signed)
Pt was sent to ed after having telemed with Carlota Raspberry. Pt says she had a lot of test done on head head while there, all test came bk with good report. While there due to the coldness of the hospital, she developed a cold and cough. Says that cough is productive. Taking otc for the cough that is not working at all. She is having some fever to come and go. Says she will stay quarantined for the time being due to the temp. Was given meloxicam and mesalomine.

## 2019-08-10 ENCOUNTER — Other Ambulatory Visit: Payer: Self-pay

## 2019-08-10 DIAGNOSIS — Z20822 Contact with and (suspected) exposure to covid-19: Secondary | ICD-10-CM

## 2019-08-10 DIAGNOSIS — Z20828 Contact with and (suspected) exposure to other viral communicable diseases: Secondary | ICD-10-CM | POA: Diagnosis not present

## 2019-08-13 LAB — NOVEL CORONAVIRUS, NAA: SARS-CoV-2, NAA: NOT DETECTED

## 2019-08-15 ENCOUNTER — Telehealth: Payer: Self-pay | Admitting: Internal Medicine

## 2019-08-15 ENCOUNTER — Ambulatory Visit: Payer: PRIVATE HEALTH INSURANCE | Admitting: Nurse Practitioner

## 2019-08-15 NOTE — Telephone Encounter (Signed)
Left message for pt to call back.  Pt states she is having a UC flare. Reports she has sores in her mouth like blisters and her stomach is very tender. Requesting appt. Pt scheduled to see Carl Best tomorrow at 3:30pm. Pt aware of appt.

## 2019-08-15 NOTE — Telephone Encounter (Signed)
Pt requested an ASAP appt for colitis flare up.

## 2019-08-16 ENCOUNTER — Other Ambulatory Visit: Payer: Managed Care, Other (non HMO)

## 2019-08-16 ENCOUNTER — Other Ambulatory Visit: Payer: Self-pay

## 2019-08-16 ENCOUNTER — Other Ambulatory Visit (INDEPENDENT_AMBULATORY_CARE_PROVIDER_SITE_OTHER): Payer: Medicare Other

## 2019-08-16 ENCOUNTER — Other Ambulatory Visit: Payer: Self-pay | Admitting: *Deleted

## 2019-08-16 ENCOUNTER — Encounter: Payer: Self-pay | Admitting: Nurse Practitioner

## 2019-08-16 ENCOUNTER — Ambulatory Visit (INDEPENDENT_AMBULATORY_CARE_PROVIDER_SITE_OTHER): Payer: Medicare Other | Admitting: Nurse Practitioner

## 2019-08-16 VITALS — BP 132/86 | HR 86 | Temp 97.6°F | Ht 62.0 in | Wt 160.6 lb

## 2019-08-16 DIAGNOSIS — Z1159 Encounter for screening for other viral diseases: Secondary | ICD-10-CM | POA: Diagnosis not present

## 2019-08-16 DIAGNOSIS — K51919 Ulcerative colitis, unspecified with unspecified complications: Secondary | ICD-10-CM

## 2019-08-16 LAB — CBC WITH DIFFERENTIAL/PLATELET
Basophils Absolute: 0 10*3/uL (ref 0.0–0.1)
Basophils Relative: 0.2 % (ref 0.0–3.0)
Eosinophils Absolute: 0.8 10*3/uL — ABNORMAL HIGH (ref 0.0–0.7)
Eosinophils Relative: 6.8 % — ABNORMAL HIGH (ref 0.0–5.0)
HCT: 38.4 % (ref 36.0–46.0)
Hemoglobin: 12.7 g/dL (ref 12.0–15.0)
Lymphocytes Relative: 34.5 % (ref 12.0–46.0)
Lymphs Abs: 4.2 10*3/uL — ABNORMAL HIGH (ref 0.7–4.0)
MCHC: 33 g/dL (ref 30.0–36.0)
MCV: 84.5 fl (ref 78.0–100.0)
Monocytes Absolute: 1 10*3/uL (ref 0.1–1.0)
Monocytes Relative: 8.4 % (ref 3.0–12.0)
Neutro Abs: 6.2 10*3/uL (ref 1.4–7.7)
Neutrophils Relative %: 50.1 % (ref 43.0–77.0)
Platelets: 400 10*3/uL (ref 150.0–400.0)
RBC: 4.54 Mil/uL (ref 3.87–5.11)
RDW: 13 % (ref 11.5–15.5)
WBC: 12.3 10*3/uL — ABNORMAL HIGH (ref 4.0–10.5)

## 2019-08-16 LAB — BASIC METABOLIC PANEL
BUN: 11 mg/dL (ref 6–23)
CO2: 26 mEq/L (ref 19–32)
Calcium: 9.3 mg/dL (ref 8.4–10.5)
Chloride: 104 mEq/L (ref 96–112)
Creatinine, Ser: 0.75 mg/dL (ref 0.40–1.20)
GFR: 96.19 mL/min (ref >60.00)
Glucose, Bld: 107 mg/dL — ABNORMAL HIGH (ref 70–99)
Potassium: 3.4 mEq/L — ABNORMAL LOW (ref 3.5–5.1)
Sodium: 138 mEq/L (ref 135–145)

## 2019-08-16 LAB — C-REACTIVE PROTEIN: CRP: <1 mg/dL (ref 0.5–20.0)

## 2019-08-16 MED ORDER — NA SULFATE-K SULFATE-MG SULF 17.5-3.13-1.6 GM/177ML PO SOLN: 1.0000 | Freq: Once | ORAL | 0 refills | Status: AC

## 2019-08-16 MED ORDER — METRONIDAZOLE 500 MG PO TABS: 500.0000 mg | ORAL_TABLET | Freq: Two times a day (BID) | ORAL | 0 refills | Status: AC

## 2019-08-16 MED ORDER — CIPROFLOXACIN HCL 500 MG PO TABS: 500.0000 mg | ORAL_TABLET | Freq: Two times a day (BID) | ORAL | 0 refills | Status: AC

## 2019-08-16 NOTE — Progress Notes (Signed)
08/16/2019 Beverly Ramirez [id number] 02-14-1961   History of Present Illness:  History of ulcerative colitis diagnosed 10 years ago. She remains on Mesalamine 1.2gm two tabs po bid. She presents today with complaints of sores in her mouth, lower abdominal soreness and softer BMs for the past week which are her typical symptoms of a UC flare. No fever, sweats or chills. She presented to [facility] ED 08/03/2019 with headaches and dizziness. Headaches started one month ago. She received Toradol 17m IM injection and Antivert po. She was prescribed Antivert 263mpo tid PRN and Meloxicam 7.15m54mne tab daily.  She has an appointment with her PCP on Thurs 12/10 for further follow up and for a neurology referral. She reports taking Meloxicam daily for the past 2 weeks. [facility] most [facility] per the patient's report. She has used Prednisone in the past for UC flares, however, she wishes to avoid Prednisone if possible because it causes her glucose levels to significantly increase. Her glucose levels have recently improved and she is no longer requiring insulin. She last received a Prednisone taper 03/2019 for a UC flare. Covid testing was negative 7/1 and 12/2 were negative. She previously was treated with oral mesalamine and 6MP when [place] lived in [place].  She received Entyvio in the past which was discontinued due to nausea and elevated LFTs. She was changed to Humira in 2017 which was discontinued when she moved to [place] [facility] 2018 and her insurance changed and Humira was no longer affordable.   EGD and colonoscopy by Dr. LouMarius Ditch08/2017: A. Gastric antral gland mucosa with inactive chronic nonspecific gastritis. Gastric fundic gland mucosa with mildly active chronic gastritis. Negative for H. Pylori. B. Mildly active chronic colitis. C, D, E. Colonic mucosa with normal limits.     Current Outpatient Medications on File Prior to Visit  Medication Sig Dispense  Refill  . atorvastatin (LIPITOR) 20 MG tablet Take 1 tablet (20 mg total) by mouth daily. 90 tablet 3  . blood glucose meter kit and supplies KIT Based on patient's insurance. Check twice a day. Dx R73.9, Z79.52 1 each 0  . DULoxetine (CYMBALTA) 30 MG capsule Take 1 capsule (30 mg total) by mouth daily. 30 capsule 3  . fluticasone (FLONASE) 50 MCG/ACT nasal spray Place 2 sprays into both nostrils daily. 16 g 0  . Insulin Glargine (LANTUS SOLOSTAR) 100 UNIT/ML Solostar Pen Inject 20 Units into the skin at bedtime. (Patient not taking: Reported on 08/16/2019) 5 pen 0  . Insulin Syringe-Needle U-100 (INSULIN SYRINGE 1CC/31GX5/16") 31G X 5/16" 1 ML MISC Inject 0.08 ( 8 Units)  into the skin 3 times daily before meals. (Patient not taking: Reported on 08/16/2019) 100 each 3  . Lancets (ONETOUCH DELICA PLUS [id number] 1 each by Does not apply route 3 (three) times daily. Dx E11.65, z79.4 (Patient not taking: Reported on 08/16/2019) 100 each 11  . magic mouthwash w/lidocaine SOLN Take 10 mLs by mouth every 2 (two) hours as needed for mouth pain. 360 mL 4  . meclizine (ANTIVERT) 25 MG tablet Take 1 tablet (25 mg total) by mouth 3 (three) times daily as needed for dizziness. 30 tablet 0  . meloxicam (MOBIC) 7.5 MG tablet Take 1 tablet (7.5 mg total) by mouth daily as needed for pain (headache). 30 tablet 0  . mesalamine (LIALDA) 1.2 g EC tablet Take 2 tablets (2.4 g total) by mouth 2 (two) times daily. 360[place]  tablet 3  . metFORMIN (GLUCOPHAGE) 500 MG tablet TAKE 1 TABLET BY MOUTH TWICE DAILY FOR TWO WEEKS. THEN TAKE 2 TABLETS BY MOUTH TWICE DAILY. 360 tablet 2  . NOVOLIN R 100 UNIT/ML injection INJECT 8 UNITS INTO THE SKIN 3 TIMES DAILY BEFORE MEALS. (Patient not taking: Reported on 08/16/2019) 30 mL 0  . ONETOUCH VERIO test strip USE TO CHECK BLOOD SUGAR TWICE A DAY (Patient not taking: Reported on 08/16/2019) 50 strip 3  . promethazine-dextromethorphan (PROMETHAZINE-DM) 6.25-15 MG/5ML syrup Take 5 mLs by mouth  4 (four) times daily as needed for cough. 118 mL 0  . triamcinolone cream (KENALOG) 0.1 % Apply 1 application topically 2 (two) times daily. 30 g 2   No current facility-administered medications on file prior to visit.    Allergies  Allergen Reactions  . Codeine Itching, Rash and Hives  . Latex Hives    With the power     Current Medications, Allergies, Past Medical History, Past Surgical History, Family History and Social History were reviewed in [employer] record.   Physical Exam: BP 132/86   Pulse 86   Temp 97.6 F (36.4 C)   Ht 5' 2"  (1.575 m)   Wt 160 lb 9.6 oz (72.8 kg)   BMI 29.37 kg/m  General: Well developed 58 year old female in no acute distress Head: Normocephalic and atraumatic Eyes:  Sclerae anicteric, conjunctiva pink  Ears: Normal auditory acuity Mouth: 3 small 2-65m aphthous ulcers to the mucosa to the central gum line, 2 ulcers to the right buccal mucosa and 1 to the left bucca mucosa Lungs: Clear throughout to auscultation Heart: Regular rate and rhythm Abdomen: Soft, lower abdominal tenderness without rebound or guarding. No abdominal distension. No masses, no hepatomegaly. Normal bowel sounds x 4 quads. Rectal: Deferred.  Musculoskeletal: Symmetrical with no gross deformities  Extremities: No edema  Neurological: Alert oriented x 4, grossly nonfocal Psychological:  Alert and cooperative. Normal mood and affect  Assessment and Recommendations:  152 58year old female with a history of ulcerative colitis presents with aphthous ulcers, lower abdomina pain and softer BMs. Patient prefers to avoid Prednisone which results in uncontrolled glucose levels.  -CBC, CRP, CMP, quantiferon gold, Hep B surface antigen and Hep B core IgG ( required for possible future biologics treatment) -Stop Meloxicam -Cipro 5038mone tab po bid x 7 days and Metronidazole 50058mne tab po bid x 7 days. Patient aware no alcohol while taking antibiotics.   -Patient to follow up in office in 1 week -Colonoscopy to be scheduled next 4 to 6 weeks, colonoscopy benefits and risks discussed including risk with sedation, risk of bleeding, perforation and infection  -flu injection recommended, patient stated she will receive the flu shot by PCP at time of office appointment 08/18/2019   2. Headaches with vertigo -neurology evaluation recommended  -patient will require non NSAID headache medication    3. DM II, insulin recently discontinued

## 2019-08-16 NOTE — Patient Instructions (Addendum)
If you are age 57 or older, your body mass index should be between 23-30. Your Body mass index is 29.26 kg/m. If this is out of the aforementioned range listed, please consider follow up with your Primary Care Provider.  If you are age 10 or younger, your body mass index should be between 19-25. Your Body mass index is 29.26 kg/m. If this is out of the aformentioned range listed, please consider follow up with your Primary Care Provider.   Your provider has requested that you go to the basement level for lab work before leaving today. Press "B" on the elevator. The lab is located at the first door on the left as you exit the elevator.   We have sent the following medications to your pharmacy for you to pick up at your convenience: 1. Cipro 500 mg twice daily for 7 days. 2. Metronidazole 500 mg twice daily for 7 days.   No Meloxicam.   Call with an update in one week.   Follow up 2 weeks after colonoscopy.

## 2019-08-18 ENCOUNTER — Other Ambulatory Visit: Payer: Self-pay

## 2019-08-18 ENCOUNTER — Telehealth (INDEPENDENT_AMBULATORY_CARE_PROVIDER_SITE_OTHER): Payer: Medicare Other | Admitting: Family Medicine

## 2019-08-18 DIAGNOSIS — R42 Dizziness and giddiness: Secondary | ICD-10-CM

## 2019-08-18 DIAGNOSIS — G4452 New daily persistent headache (NDPH): Secondary | ICD-10-CM

## 2019-08-18 LAB — QUANTIFERON-TB GOLD PLUS
Mitogen-NIL: >10 IU/mL
NIL: 0.05 IU/mL
QuantiFERON-TB Gold Plus: NEGATIVE
TB1-NIL: 0.01 IU/mL
TB2-NIL: 0.01 IU/mL

## 2019-08-18 LAB — HEPATITIS B CORE ANTIBODY, TOTAL: Hep B Core Total Ab: NONREACTIVE

## 2019-08-18 LAB — HEPATITIS B SURFACE ANTIGEN: Hepatitis B Surface Ag: NONREACTIVE

## 2019-08-18 MED ORDER — FLUTICASONE PROPIONATE 50 MCG/ACT NA SUSP: 2.0000 | Freq: Every day | NASAL | 0 refills | Status: DC

## 2019-08-18 MED ORDER — PROCHLORPERAZINE MALEATE 10 MG PO TABS: 10.0000 mg | ORAL_TABLET | Freq: Four times a day (QID) | ORAL | 0 refills | Status: DC | PRN

## 2019-08-18 NOTE — Progress Notes (Signed)
Virtual Visit Note  I connected with patient on 08/18/19 at 1147am by phone and verified that I am speaking with the correct person using two identifiers. Soraya Mcmeekin-Hines is currently located at home and patient is currently with them during visit. The provider, Rutherford Guys, MD is located in their office at time of visit.  I discussed the limitations, risks, security and privacy concerns of performing an evaluation and management service by telephone and the availability of in person appointments. I also discussed with the patient that there may be a patient responsible charge related to this service. The patient expressed understanding and agreed to proceed.   CC: headaches/dizziness  HPI ? Patient started with new onset headaches/occipital with dizziness about 3 weeks ago by Dr Carlota Raspberry, sent to ER, normal CT, rx meloxicam and meclazine, a week later developed uri sx, covid neg  Saw GI yesterday, stopped meloxicam as that makes her UC worse, started her on flagyl and cipro, scheduled for colonoscopy in 2 weeks  Fever resolved, cough and congestion much better Still having back ache, headaches and dizziness (spinning room when she lies down) Not taking meloxicam anymore Meclizine provides partial relief Requesting referral to neurology    Allergies  Allergen Reactions  . Codeine Itching, Rash and Hives  . Latex Hives    With the power    Prior to Admission medications   Medication Sig Start Date End Date Taking? Authorizing Provider  atorvastatin (LIPITOR) 20 MG tablet Take 1 tablet (20 mg total) by mouth daily. 07/28/19  Yes Rutherford Guys, MD  blood glucose meter kit and supplies KIT Based on patient's insurance. Check twice a day. Dx R73.9, Z79.52 09/24/18  Yes Rutherford Guys, MD  ciprofloxacin (CIPRO) 500 MG tablet Take 1 tablet (500 mg total) by mouth 2 (two) times daily for 7 days. 08/16/19 08/23/19 Yes Noralyn Pick, NP  DULoxetine (CYMBALTA) 30 MG  capsule Take 1 capsule (30 mg total) by mouth daily. 04/26/19  Yes Rutherford Guys, MD  fluticasone [facility]) 50 MCG/ACT nasal spray Place 2 sprays into both nostrils daily. 12/23/18  Yes Jacqlyn Larsen, PA-C  Insulin Glargine (LANTUS SOLOSTAR) 100 UNIT/ML Solostar Pen Inject 20 Units into the skin at bedtime. 11/11/18  Yes Lamptey, Myrene Galas, MD  Insulin Syringe-Needle U-100 (INSULIN SYRINGE 1CC/31GX5/16") 31G X 5/16" 1 ML MISC Inject 0.08 ( 8 Units)  into the skin 3 times daily before meals. 10/16/18  Yes Rutherford Guys, MD  Lancets (ONETOUCH DELICA PLUS [id number]) [place] 1 each by Does not apply route 3 (three) times daily. Dx E11.65, z79.4 01/24/19  Yes Rutherford Guys, MD  magic mouthwash w/lidocaine SOLN Take 10 mLs by mouth every 2 (two) hours as needed for mouth pain. 07/25/19  Yes Rutherford Guys, MD  meclizine (ANTIVERT) 25 MG tablet Take 1 tablet (25 mg total) by mouth 3 (three) times daily as needed for dizziness. 08/04/19  Yes Mesner, Corene Cornea, MD  meloxicam (MOBIC) 7.5 MG tablet Take 1 tablet (7.5 mg total) by mouth daily as needed for pain (headache). 08/04/19  Yes Mesner, Corene Cornea, MD  mesalamine (LIALDA) 1.2 g EC tablet Take 2 tablets (2.4 g total) by mouth 2 (two) times daily. 11/25/18 11/20/19 Yes Gatha Mayer, MD  metFORMIN (GLUCOPHAGE) 500 MG tablet TAKE 1 TABLET BY MOUTH TWICE DAILY FOR TWO WEEKS. THEN TAKE 2 TABLETS BY MOUTH TWICE DAILY. 02/22/19  Yes Rutherford Guys, MD  metroNIDAZOLE (FLAGYL) 500 MG tablet Take 1 tablet (  500 mg total) by mouth 2 (two) times daily for 7 days. 08/16/19 08/23/19 Yes Kennedy-Smith, Patrecia Pour, NP  NOVOLIN R 100 UNIT/ML injection INJECT 8 UNITS INTO THE SKIN 3 TIMES DAILY BEFORE MEALS. 03/20/19  Yes Rutherford Guys, MD  [facility] VERIO test strip USE TO CHECK BLOOD SUGAR TWICE A DAY 05/15/19  Yes Rutherford Guys, MD  promethazine-dextromethorphan (PROMETHAZINE-DM) 6.25-15 MG/5ML syrup Take 5 mLs by mouth 4 (four) times daily as needed for cough. 08/08/19  Yes  Rutherford Guys, MD  triamcinolone cream (KENALOG) 0.1 % Apply 1 application topically 2 (two) times daily. 07/25/19  Yes Rutherford Guys, MD    Past Medical History:  Diagnosis Date  . Anemia   . Carpal tunnel syndrome    bilateral  . Chronic heel pain   . DJD (degenerative joint disease) of cervical spine    MRI 2017  . Hyperlipidemia   . Plantar fasciitis    bilateral  . Tendonitis    left hand/wrist  . UC (ulcerative colitis) ([place])     Past Surgical History:  Procedure Laterality Date  . ABDOMINAL HYSTERECTOMY    . APPENDECTOMY    . CESAREAN SECTION     x4  . COLONOSCOPY     Multiple in [place]  . ESOPHAGOGASTRODUODENOSCOPY      Social History   Tobacco Use  . Smoking status: Former Smoker    Types: Cigarettes    Quit date: 10/13/2006    Years since quitting: 12.8  . Smokeless tobacco: Never Used  Substance Use Topics  . Alcohol use: Yes    Comment: occasional    Family History  Problem Relation Age of Onset  . Stroke Mother   . Diabetes Mother   . Hypertension Mother   . Lung cancer Mother   . Sickle cell trait Mother   . Breast cancer Maternal Aunt   . Diabetes Maternal Aunt   . Lung cancer Maternal Aunt   . Lung cancer Maternal Aunt   . Lung cancer Maternal Aunt   . Colon cancer Cousin 50  . Sickle cell trait Father   . Sickle cell anemia Sister 87  . Lung cancer Maternal Grandmother   . Sickle cell anemia Sister   . Other Sister        accident and blood clot formed    ROS Per hpi  Objective  Vitals as reported by the patient: none   ASSESSMENT and PLAN  1. New persistent daily headache 2. Vertigo Trial of compazine instead of antivert. Reviewed r/se/b. Referring to neurology as requested.  - Ambulatory referral to Neurology  Other orders - prochlorperazine (COMPAZINE) 10 MG tablet; Take 1 tablet (10 mg total) by mouth every 6 (six) hours as needed (dizziness). - fluticasone (FLONASE) 50 MCG/ACT nasal spray; Place 2 sprays  into both nostrils daily.  FOLLOW-UP: after neuro   The above assessment and management plan was discussed with the patient. The patient verbalized understanding of and has agreed to the management plan. Patient is aware to call the clinic if symptoms persist or worsen. Patient is aware when to return to the clinic for a follow-up visit. Patient educated on when it is appropriate to go to the emergency department.    I provided 12 minutes of non-face-to-face time during this encounter.  Rutherford Guys, MD Primary Care at [place], [place] [address] Ph.  [phone] Fax [phone]

## 2019-08-18 NOTE — Progress Notes (Signed)
Pt is following up from gastro appt. Notes in chart. Says all test come bk normal. She has been referred to Neuro. She has been taking off the meloxicam, was told that is causing her flares with colitis. The meloxicam  is the med that best works for the headaches. Since not taking, stays dizzy and lightheaded, can't lay flat, room spins. She has benn put on 2 antibiotics cipro and metronidazine or 7 days. She is having a colonoscopy in 2 wks.

## 2019-08-23 ENCOUNTER — Other Ambulatory Visit: Payer: Self-pay

## 2019-08-23 ENCOUNTER — Encounter: Payer: Self-pay | Admitting: Neurology

## 2019-08-23 ENCOUNTER — Ambulatory Visit (INDEPENDENT_AMBULATORY_CARE_PROVIDER_SITE_OTHER): Payer: Medicare Other | Admitting: Neurology

## 2019-08-23 VITALS — BP 130/87 | HR 80 | Ht 62.5 in | Wt 160.0 lb

## 2019-08-23 DIAGNOSIS — R42 Dizziness and giddiness: Secondary | ICD-10-CM | POA: Diagnosis not present

## 2019-08-23 DIAGNOSIS — G4719 Other hypersomnia: Secondary | ICD-10-CM

## 2019-08-23 DIAGNOSIS — R519 Headache, unspecified: Secondary | ICD-10-CM

## 2019-08-23 DIAGNOSIS — H81399 Other peripheral vertigo, unspecified ear: Secondary | ICD-10-CM | POA: Diagnosis not present

## 2019-08-23 DIAGNOSIS — H9319 Tinnitus, unspecified ear: Secondary | ICD-10-CM

## 2019-08-23 NOTE — Progress Notes (Signed)
Subjective:    Patient ID: Beverly Ramirez is a 58 y.o. female.  HPI     Star Age, MD, PhD [facility] [address], [address] [place], [place] [address]  Dear Dr. Pamella Pert, I saw your patient, Beverly Ramirez, upon your kind request in my neurologic clinic today for initial consultation of her recurrent headaches and spells of dizziness.  The patient is unaccompanied today.  As you know, Beverly Ramirez is a 58 year old right-handed woman with an underlying medical history of ulcerative colitis, overweight state, hyperlipidemia, degenerative disc disease, anemia, as well as sleep disturbance, who reports recurrent headaches in the back of her head for the past several weeks.  She had a recent head CT on 08/04/2019 without contrast and I reviewed the results: IMPRESSION: Normal head CT.   She was recently taken off of Mobic because it exacerbated her ulcerative colitis.  She tries to hydrate well, she estimates that she drinks about a quart of water per day.  She drinks tea, 2 servings per day and occasional soda.  She still does not sleep well, she has nocturnal and morning headaches.  She never had a sleep study, I saw her last year in November for sleep evaluation but a sleep study did not come to fruition.  Her Epworth sleepiness score is 8 out of 24, fatigue severity score is 32 out of 63.  She would be willing to proceed with a sleep study at this time.  She reports intermittent dizzy spells and spinning sensations with positional changes.  She has intermittent ringing in the ears, bilaterally.  The headaches start in the back of her head and are not necessarily correlated with the vertigo symptoms.  She has not seen ENT.  She has had some muffled hearing, unclear which ear.  She has had some nausea intermittently with the vertigo symptoms.  She has some light sensitivity with the headache but tends to be in the back of her head or in the upper  neck area bilaterally.  It is a dull achy sensation typically.  She has no history of migraines before.  For her dizziness she has tried meclizine.  She denies any sudden onset of one-sided weakness or numbness or tingling or droopy face or slurring of speech.  Previously:   07/20/2018: Beverly Ramirez is a 58 year old right-handed woman with an underlying medical history of hyperlipidemia, degenerative disc disease, anemia, ulcerative colitis and overweight state, who reports snoring and excessive daytime somnolence. I reviewed your office note from 05/07/2018. She has had recurrent headaches including morning headaches. Her Epworth sleepiness score is 8 out of 24, fatigue score is 15 out of 63. She does snore, she does not wake herself up with gasping, but husband has mentioned pauses in her breathing while she is asleep. She does not sleep well, does not wake up fully rested. Bedtime is around 11 PM, but wakes up in the middle of the night. She wakes up around 6:45 AM, when her daughter leaves for college. She has nocturia about 2-3 times per night. Weight has been stable, has a nephew with OSA.  She does not endorse RLS symptoms. She has a Hx of bronchitis and pneumonia. Quit smoking about 10 years ago, rare EtOH, drinks no daily caffeine. She does not currently work. She lives with her husband and her 76 year old daughter. She has a total of 4 children.   Her Past Medical History Is Significant For: Past Medical History:  Diagnosis Date  .  Anemia   . Carpal tunnel syndrome    bilateral  . Chronic heel pain   . DJD (degenerative joint disease) of cervical spine    MRI 2017  . Hyperlipidemia   . Plantar fasciitis    bilateral  . Tendonitis    left hand/wrist  . UC (ulcerative colitis) ([place])     Her Past Surgical History Is Significant For: Past Surgical History:  Procedure Laterality Date  . ABDOMINAL HYSTERECTOMY    . APPENDECTOMY    . CESAREAN SECTION     x4  . COLONOSCOPY      Multiple in [place]  . ESOPHAGOGASTRODUODENOSCOPY      Her Family History Is Significant For: Family History  Problem Relation Age of Onset  . Stroke Mother   . Diabetes Mother   . Hypertension Mother   . Lung cancer Mother   . Sickle cell trait Mother   . Breast cancer Maternal Aunt   . Diabetes Maternal Aunt   . Lung cancer Maternal Aunt   . Lung cancer Maternal Aunt   . Lung cancer Maternal Aunt   . Colon cancer Cousin 24  . Sickle cell trait Father   . Sickle cell anemia Sister 25  . Lung cancer Maternal Grandmother   . Sickle cell anemia Sister   . Other Sister        accident and blood clot formed    Her Social History Is Significant For: Social History   Socioeconomic History  . Marital status: Married    Spouse name: Simona Huh  . Number of children: 4  . Years of education: Not on file  . Highest education level: Not on file  Occupational History  . Occupation: retired    Comment: He formerly worked for [employer] of [employer], disabled due to a fall  Tobacco Use  . Smoking status: Former Smoker    Types: Cigarettes    Quit date: 10/13/2006    Years since quitting: 12.8  . Smokeless tobacco: Never Used  Substance and Sexual Activity  . Alcohol use: Yes    Comment: occasional  . Drug use: No  . Sexual activity: Yes    Partners: Male    Comment: 1ST intercourse- 27, partners- 17, married- 74 yrs   Other Topics Concern  . Not on file  Social History Narrative   She is married, 3 Ramirez born 1979, 1981, 1984.  One daughter born in 54.   She is medically retired from the [employer] after a fall and a hip injury, around 2004.   Caffeinated beverage every other day x1   07/13/2017   Social Determinants of Health   Financial Resource Strain:   . Difficulty of Paying Living Expenses: Not on file  Food Insecurity:   . Worried About Charity fundraiser in the Last Year: Not on file  . Ran Out of Food in the Last Year: Not on  file  Transportation Needs:   . Lack of Transportation (Medical): Not on file  . Lack of Transportation (Non-Medical): Not on file  Physical Activity:   . Days of Exercise per Week: Not on file  . Minutes of Exercise per Session: Not on file  Stress:   . Feeling of Stress : Not on file  Social Connections:   . Frequency of Communication with Friends and Family: Not on file  . Frequency of Social Gatherings with Friends and Family: Not on file  . Attends Religious  Services: Not on file  . Active Member of Clubs or Organizations: Not on file  . Attends Archivist Meetings: Not on file  . Marital Status: Not on file    Her Allergies Are:  Allergies  Allergen Reactions  . Codeine Itching, Rash and Hives  . Latex Hives    With the power  :   Her Current Medications Are:  Outpatient Encounter Medications as of 08/23/2019  Medication Sig  . atorvastatin (LIPITOR) 20 MG tablet Take 1 tablet (20 mg total) by mouth daily.  . blood glucose meter kit and supplies KIT Based on patient's insurance. Check twice a day. Dx R73.9, Z79.52  . ciprofloxacin (CIPRO) 500 MG tablet Take 1 tablet (500 mg total) by mouth 2 (two) times daily for 7 days.  . DULoxetine (CYMBALTA) 30 MG capsule Take 1 capsule (30 mg total) by mouth daily.  . fluticasone (FLONASE) 50 MCG/ACT nasal spray Place 2 sprays into both nostrils daily.  . Insulin Glargine (LANTUS SOLOSTAR) 100 UNIT/ML Solostar Pen Inject 20 Units into the skin at bedtime.  . Insulin Syringe-Needle U-100 (INSULIN SYRINGE 1CC/31GX5/16") 31G X 5/16" 1 ML MISC Inject 0.08 ( 8 Units)  into the skin 3 times daily before meals.  . Lancets (ONETOUCH DELICA PLUS [id number]) MISC 1 each by Does not apply route 3 (three) times daily. Dx E11.65, z79.4  . magic mouthwash w/lidocaine SOLN Take 10 mLs by mouth every 2 (two) hours as needed for mouth pain.  . mesalamine (LIALDA) 1.2 g EC tablet Take 2 tablets (2.4 g total) by mouth 2 (two) times daily.  .  metFORMIN (GLUCOPHAGE) 500 MG tablet TAKE 1 TABLET BY MOUTH TWICE DAILY FOR TWO WEEKS. THEN TAKE 2 TABLETS BY MOUTH TWICE DAILY.  . metroNIDAZOLE (FLAGYL) 500 MG tablet Take 1 tablet (500 mg total) by mouth 2 (two) times daily for 7 days.  Marland Kitchen NOVOLIN R 100 UNIT/ML injection INJECT 8 UNITS INTO THE SKIN 3 TIMES DAILY BEFORE MEALS.  Marland Kitchen ONETOUCH VERIO test strip USE TO CHECK BLOOD SUGAR TWICE A DAY  . prochlorperazine (COMPAZINE) 10 MG tablet Take 1 tablet (10 mg total) by mouth every 6 (six) hours as needed (dizziness).  . promethazine-dextromethorphan (PROMETHAZINE-DM) 6.25-15 MG/5ML syrup Take 5 mLs by mouth 4 (four) times daily as needed for cough.  . triamcinolone cream (KENALOG) 0.1 % Apply 1 application topically 2 (two) times daily.   No facility-administered encounter medications on file as of 08/23/2019.  :  Review of Systems:  Out of a complete 14 point review of systems, all are reviewed and negative with the exception of these symptoms as listed below: Review of Systems  Neurological:       Pt presents today to discuss her daily headache. Pt complains of a headache that comes and goes all day. Compazine helps with dizziness but not with her headache. Pt denies associated light and sound sensitivity. She does endorse occasional nausea with the headache. Pt can't remember why she did not complete the sleep study ordered last year.  Epworth Sleepiness Scale 0= would never doze 1= slight chance of dozing 2= moderate chance of dozing 3= high chance of dozing  Sitting and reading: 2 Watching TV: 2 Sitting inactive in a public place (ex. Theater or meeting): 1 As a passenger in a car for an hour without a break: 1 Lying down to rest in the afternoon: 2 Sitting and talking to someone: 0 Sitting quietly after lunch (no alcohol): 0 In a car,  while stopped in traffic: 0 Total: 8     Objective:  Neurological Exam  Physical Exam Physical Examination:   Vitals:   08/23/19 0958   BP: 130/87  Pulse: 80   General Examination: The patient is a very pleasant 58 y.o. female in no acute distress. She appears well-developed and well-nourished and well groomed.   HEENT: Normocephalic, atraumatic, pupils are equal, round and reactive to light, extraocular tracking is good without limitation to gaze excursion or nystagmus noted. She has no vertigo symptoms with sudden changes of head and neck position. Funduscopic exam is normal, tympanic membranes are clear bilaterally. Hearing is grossly intact. Face is symmetric with normal facial animation. Speech is clear with no dysarthria noted. There is no hypophonia. There is no lip, neck/head, jaw or voice tremor. Neck is supple with full range of passive and active motion. There are no carotid bruits on auscultation. Oropharynx exam reveals: moderate mouth dryness, adequate dental hygiene and mild airway crowding. Mallampati is class I. Tongue protrudes centrally and palate elevates symmetrically. Tonsils 1+.  Chest: Clear to auscultation without wheezing, rhonchi or crackles noted.  Heart: S1+S2+0, regular and normal without murmurs, rubs or gallops noted.   Abdomen: Soft, non-tender and non-distended with normal bowel sounds appreciated on auscultation.  Extremities: There is no pitting edema in the distal lower extremities bilaterally.   Skin: Warm and dry without trophic changes noted.   Musculoskeletal: exam reveals no obvious joint deformities, tenderness or joint swelling or erythema.   Neurologically:  Mental status: The patient is awake, alert and oriented in all 4 spheres. Her immediate and remote memory, attention, language skills and fund of knowledge are appropriate. There is no evidence of aphasia, agnosia, apraxia or anomia. Speech is clear with normal prosody and enunciation. Thought process is linear. Mood is normal and affect is normal.  Cranial nerves II - XII are as described above under HEENT exam.  Motor exam:  Normal bulk, strength and tone is noted. There is no tremor, Romberg is negative. Reflexes are 2+ throughout. Fine motor skills and coordination: grossly intact.  Cerebellar testing: No dysmetria or intention tremor. There is no truncal or gait ataxia.  Sensory exam: intact to light touch in the upper and lower extremities.  Gait, station and balance: He stands easily. No veering to one side is noted. No leaning to one side is noted. Posture is age-appropriate and stance is narrow based. Gait shows normal stride length and normal pace. No problems turning are noted. Tandem walk is unremarkable.                Assessment and Plan:  Assessment and Plan:  In summary, Beverly Ramirez is a very pleasant 58 y.o.-year old female with an underlying medical history of ulcerative colitis, overweight state, hyperlipidemia, degenerative disc disease, anemia, as well as sleep disturbance, who Presents for evaluation of her recurrent headaches in the occipital and posterior region.  Her history is not telltale for migraine headaches and she does not have a prior history of migraines.  She reports dizziness and symptoms of positional vertigo, exam today is benign and reassuring.  Nevertheless, I would like to proceed with a brain MRI with and without contrast to rule out a structural cause of her symptoms and a central reason for vertigo. In addition, she reports sleep disturbance, morning and nocturnal headaches for which we should look for an underlying sleep disorder including obstructive sleep apnea.  She was supposed to have a sleep study last  year and for some reason it did not come to fruition.  She is agreeable to pursuing a sleep study and considering CPAP therapy if the need arises. She is encouraged to talk to you about potentially seeing an ear nose throat specialist for her tinnitus and vertigo symptoms. We will keep her posted as to her test results.  I plan to see her back after her sleep study.  She is  encouraged to try Tylenol if needed.  She is discouraged from utilizing nonsteroidal anti-inflammatory medication and she has recently stopped taking Mobic.She is advised to stay well-hydrated with water.I answered all her questions today and she was in agreement. Thank you very much for allowing me to participate in the care of this nice patient. If I can be of any further assistance to you please do not hesitate to call me at [phone].  Sincerely,   Star Age, MD, PhD

## 2019-08-23 NOTE — Patient Instructions (Signed)
Let's get to the bottom of these recurrent new headaches for the past several weeks.  For now, use Tylenol as needed, try to stay well-hydrated, drink 6 to 8 cups of water per day, limit caffeine to 2 servings per day.Try to get enough sleep, 7 to 8 hours are recommended.  I recommend additional diagnostic testing at this time: Since you have had intermittent dizziness and positional vertigo type symptoms I would like to proceed with a brain MRI with and without contrast.  Let us also proceed with the sleep study that we were trying to pursue last year.Underlying untreated obstructive sleep apnea may be the cause for these nighttime or morning headaches. Please also talk to Dr. Pamella Pert about potentially seeing an ENT, ear nose throat specialist for your ringing in the ears and vertigo symptoms. Thankfully, your neurological exam is normal today.

## 2019-08-29 ENCOUNTER — Ambulatory Visit (INDEPENDENT_AMBULATORY_CARE_PROVIDER_SITE_OTHER): Payer: Medicare Other

## 2019-08-29 ENCOUNTER — Other Ambulatory Visit: Payer: Self-pay | Admitting: Internal Medicine

## 2019-08-29 DIAGNOSIS — Z1159 Encounter for screening for other viral diseases: Secondary | ICD-10-CM

## 2019-08-30 LAB — SARS CORONAVIRUS 2 (TAT 6-24 HRS): SARS Coronavirus 2: NEGATIVE

## 2019-08-31 ENCOUNTER — Encounter: Payer: Self-pay | Admitting: Internal Medicine

## 2019-08-31 ENCOUNTER — Ambulatory Visit (AMBULATORY_SURGERY_CENTER): Payer: Medicare Other | Admitting: Internal Medicine

## 2019-08-31 ENCOUNTER — Other Ambulatory Visit: Payer: Self-pay

## 2019-08-31 VITALS — BP 138/81 | HR 77 | Temp 98.5°F | Resp 16 | Ht 62.0 in | Wt 160.0 lb

## 2019-08-31 DIAGNOSIS — K51919 Ulcerative colitis, unspecified with unspecified complications: Secondary | ICD-10-CM | POA: Diagnosis not present

## 2019-08-31 DIAGNOSIS — K529 Noninfective gastroenteritis and colitis, unspecified: Secondary | ICD-10-CM | POA: Diagnosis not present

## 2019-08-31 MED ORDER — SODIUM CHLORIDE 0.9 % IV SOLN: 500.0000 mL | INTRAVENOUS | Status: DC

## 2019-08-31 NOTE — Progress Notes (Signed)
[place]

## 2019-08-31 NOTE — Op Note (Signed)
[facility] Patient Name: Beverly Ramirez Procedure Date: 08/31/2019 11:10 AM MRN: [id number] Endoscopist: Gatha Mayer , MD Age: 58 Referring MD:  Date of Birth: December 29, 1960 Gender: Female Account #: [id number] Procedure:                Colonoscopy Indications:              Chronic ulcerative pancolitis, Follow-up of chronic                            ulcerative pancolitis, Disease activity assessment                            of chronic ulcerative pancolitis, Assess                            therapeutic response to therapy of chronic                            ulcerative pancolitis Medicines:                Propofol per Anesthesia, Monitored Anesthesia Care Procedure:                Pre-Anesthesia Assessment:                           - Prior to the procedure, a History and Physical                            was performed, and patient medications and                            allergies were reviewed. The patient's tolerance of                            previous anesthesia was also reviewed. The risks                            and benefits of the procedure and the sedation                            options and risks were discussed with the patient.                            All questions were answered, and informed consent                            was obtained. Prior Anticoagulants: The patient has                            taken no previous anticoagulant or antiplatelet                            agents. ASA Grade Assessment: III - A patient with  severe systemic disease. After reviewing the risks                            and benefits, the patient was deemed in                            satisfactory condition to undergo the procedure.                           After obtaining informed consent, the colonoscope                            was passed under direct vision. Throughout the                            procedure, the  patient's blood pressure, pulse, and                            oxygen saturations were monitored continuously. The                            Colonoscope was introduced through the anus and                            advanced to the the terminal ileum, with                            identification of the appendiceal orifice and IC                            valve. The colonoscopy was performed without                            difficulty. The patient tolerated the procedure                            well. The quality of the bowel preparation was                            good. The bowel preparation used was Miralax via                            split dose instruction. The ileocecal valve,                            appendiceal orifice, and rectum were photographed. Scope In: 11:18:40 AM Scope Out: 11:34:58 AM Scope Withdrawal Time: 0 hours 14 minutes 5 seconds  Total Procedure Duration: 0 hours 16 minutes 18 seconds  Findings:                 The perianal and digital rectal examinations were                            normal.  A few small-mouthed diverticula were found in the                            sigmoid colon.                           The terminal ileum appeared normal. Biopsies were                            taken with a cold forceps for histology.                            Verification of patient identification for the                            specimen was done. Estimated blood loss was minimal.                           The exam was otherwise without abnormality on                            direct and retroflexion views.                           Two biopsies were taken every 10 cm with a cold                            forceps from the cecum, ascending colon, transverse                            colon, descending colon, sigmoid colon and rectum                            for ulcerative colitis surveillance. These biopsy                             specimens were sent to Pathology. Verification of                            patient identification for the specimen was done.                            Estimated blood loss was minimal. Complications:            No immediate complications. Estimated Blood Loss:     Estimated blood loss was minimal. Impression:               - Diverticulosis in the sigmoid colon.                           - The examined portion of the ileum was normal.                            Biopsied.                           -  The examination was otherwise normal on direct                            and retroflexion views. No endoscopic abnormalities                            in this patient with ulcerative colitis on                            mesalamine.                           - Biopsies for surveillance were taken from the                            cecum, ascending colon, transverse colon,                            descending colon, sigmoid colon and rectum. Recommendation:           - Patient has a contact number available for                            emergencies. The signs and symptoms of potential                            delayed complications were discussed with the                            patient. Return to normal activities tomorrow.                            Written discharge instructions were provided to the                            patient.                           - Resume previous diet.                           - Continue present medications.                           - Repeat colonoscopy is recommended for                            surveillance. The colonoscopy date will be                            determined after pathology results from today's                            exam become available for review. Gatha Mayer, MD 08/31/2019 11:44:11 AM This report has been signed electronically.

## 2019-08-31 NOTE — Progress Notes (Signed)
Called to room to assist during endoscopic procedure.  Patient ID and intended procedure confirmed with present staff. Received instructions for my participation in the procedure from the performing physician.  

## 2019-08-31 NOTE — Progress Notes (Signed)
Report to PACU, RN, vss, BBS= Clear.  

## 2019-08-31 NOTE — Patient Instructions (Addendum)
Things look good - I did not see any inflammation but took biopsies for more information. Once I see the results will let you know.  I appreciate the opportunity to care for you. Gatha Mayer, MD, FACG  YOU HAD AN ENDOSCOPIC PROCEDURE TODAY AT [facility]:   Refer to the procedure report that was given to you for any specific questions about what was found during the examination.  If the procedure report does not answer your questions, please call your gastroenterologist to clarify.  If you requested that your care partner not be given the details of your procedure findings, then the procedure report has been included in a sealed envelope for you to review at your convenience later.  YOU SHOULD EXPECT: Some feelings of bloating in the abdomen. Passage of more gas than usual.  Walking can help get rid of the air that was put into your GI tract during the procedure and reduce the bloating. If you had a lower endoscopy (such as a colonoscopy or flexible sigmoidoscopy) you may notice spotting of blood in your stool or on the toilet paper. If you underwent a bowel prep for your procedure, you may not have a normal bowel movement for a few days.  Please Note:  You might notice some irritation and congestion in your nose or some drainage.  This is from the oxygen used during your procedure.  There is no need for concern and it should clear up in a day or so.  SYMPTOMS TO REPORT IMMEDIATELY:   Following lower endoscopy (colonoscopy or flexible sigmoidoscopy):  Excessive amounts of blood in the stool  Significant tenderness or worsening of abdominal pains  Swelling of the abdomen that is new, acute  Fever of 100F or higher  For urgent or emergent issues, a gastroenterologist can be reached at any hour by calling [phone].   DIET:  We do recommend a small meal at first, but then you may proceed to your regular diet.  Drink plenty of fluids but you should avoid alcoholic  beverages for 24 hours.  ACTIVITY:  You should plan to take it easy for the rest of today and you should NOT DRIVE or use heavy machinery until tomorrow (because of the sedation medicines used during the test).    FOLLOW UP: Our staff will call the number listed on your records 48-72 hours following your procedure to check on you and address any questions or concerns that you may have regarding the information given to you following your procedure. If we do not reach you, we will leave a message.  We will attempt to reach you two times.  During this call, we will ask if you have developed any symptoms of COVID 19. If you develop any symptoms (ie: fever, flu-like symptoms, shortness of breath, cough etc.) before then, please call [phone].  If you test positive for Covid 19 in the 2 weeks post procedure, please call and report this information to [place].    If any biopsies were taken you will be contacted by phone or by letter within the next 1-3 weeks.  Please call us at [phone] if you have not heard about the biopsies in 3 weeks.    SIGNATURES/CONFIDENTIALITY: You and/or your care partner have signed paperwork which will be entered into your electronic medical record.  These signatures attest to the fact that that the information above on your After Visit Summary has been reviewed and is understood.  Full responsibility of  the confidentiality of this discharge information lies with you and/or your care-partner.   Await pathology  Please read over handout about diverticulosis   Continue your normal medications

## 2019-09-05 ENCOUNTER — Telehealth: Payer: Self-pay | Admitting: *Deleted

## 2019-09-05 NOTE — Telephone Encounter (Signed)
1. Have you developed a fever since your procedure? no  2.   Have you had an respiratory symptoms (SOB or cough) since your procedure? no  3.   Have you tested positive for COVID 19 since your procedure no  4.   Have you had any family members/close contacts diagnosed with the COVID 19 since your procedure?  no   If yes to any of these questions please route to Joylene John, RN and Alphonsa Gin, Therapist, sports.  Follow up Call-  Call back number 08/31/2019  Post procedure Call Back phone  # [phone]  Permission to leave phone message Yes  Some recent data might be hidden     Patient questions:   Do you have a fever, pain , or abdominal swelling? No. Pain Score  0 *  Have you tolerated food without any problems? Yes.    Have you been able to return to your normal activities? Yes.    Do you have any questions about your discharge instructions: Diet   No. Medications  No. Follow up visit  No.  Do you have questions or concerns about your Care? No.  Actions: * If pain score is 4 or above: No action needed, pain <4.

## 2019-09-08 ENCOUNTER — Encounter: Payer: Self-pay | Admitting: Internal Medicine

## 2019-09-10 ENCOUNTER — Other Ambulatory Visit: Payer: Self-pay | Admitting: Family Medicine

## 2019-09-21 ENCOUNTER — Ambulatory Visit
Admission: RE | Admit: 2019-09-21 | Discharge: 2019-09-21 | Disposition: A | Discharge status: Home / Self Care | Payer: Medicare Other | Source: Ambulatory Visit | Attending: Neurology | Admitting: Neurology

## 2019-09-21 ENCOUNTER — Other Ambulatory Visit: Payer: Self-pay

## 2019-09-21 DIAGNOSIS — R519 Headache, unspecified: Secondary | ICD-10-CM | POA: Diagnosis not present

## 2019-09-21 DIAGNOSIS — H81399 Other peripheral vertigo, unspecified ear: Secondary | ICD-10-CM

## 2019-09-21 DIAGNOSIS — R42 Dizziness and giddiness: Secondary | ICD-10-CM

## 2019-09-21 DIAGNOSIS — H9319 Tinnitus, unspecified ear: Secondary | ICD-10-CM

## 2019-09-21 DIAGNOSIS — G4719 Other hypersomnia: Secondary | ICD-10-CM

## 2019-09-21 MED ORDER — GADOBENATE DIMEGLUMINE 529 MG/ML IV SOLN
15.0000 mL | Freq: Once | INTRAVENOUS | Status: AC | PRN
  Administered 2019-09-21: 15 mL via INTRAVENOUS

## 2019-09-25 ENCOUNTER — Ambulatory Visit (INDEPENDENT_AMBULATORY_CARE_PROVIDER_SITE_OTHER): Payer: Medicare Other | Admitting: Neurology

## 2019-09-25 DIAGNOSIS — R519 Headache, unspecified: Secondary | ICD-10-CM

## 2019-09-25 DIAGNOSIS — G4720 Circadian rhythm sleep disorder, unspecified type: Secondary | ICD-10-CM

## 2019-09-25 DIAGNOSIS — R42 Dizziness and giddiness: Secondary | ICD-10-CM

## 2019-09-25 DIAGNOSIS — G4719 Other hypersomnia: Secondary | ICD-10-CM

## 2019-09-25 DIAGNOSIS — G4733 Obstructive sleep apnea (adult) (pediatric): Secondary | ICD-10-CM | POA: Diagnosis not present

## 2019-09-25 DIAGNOSIS — H9319 Tinnitus, unspecified ear: Secondary | ICD-10-CM

## 2019-09-25 DIAGNOSIS — H81399 Other peripheral vertigo, unspecified ear: Secondary | ICD-10-CM

## 2019-09-26 ENCOUNTER — Encounter: Payer: Self-pay | Admitting: Family Medicine

## 2019-09-26 ENCOUNTER — Ambulatory Visit (INDEPENDENT_AMBULATORY_CARE_PROVIDER_SITE_OTHER): Payer: Medicare Other | Admitting: Family Medicine

## 2019-09-26 ENCOUNTER — Telehealth: Payer: Self-pay

## 2019-09-26 ENCOUNTER — Other Ambulatory Visit: Payer: Self-pay

## 2019-09-26 VITALS — BP 140/85 | HR 69 | Temp 97.5°F | Ht 62.0 in | Wt 160.0 lb

## 2019-09-26 DIAGNOSIS — L309 Dermatitis, unspecified: Secondary | ICD-10-CM | POA: Diagnosis not present

## 2019-09-26 DIAGNOSIS — M797 Fibromyalgia: Secondary | ICD-10-CM | POA: Diagnosis not present

## 2019-09-26 DIAGNOSIS — H9313 Tinnitus, bilateral: Secondary | ICD-10-CM | POA: Diagnosis not present

## 2019-09-26 DIAGNOSIS — K580 Irritable bowel syndrome with diarrhea: Secondary | ICD-10-CM

## 2019-09-26 DIAGNOSIS — K5100 Ulcerative (chronic) pancolitis without complications: Secondary | ICD-10-CM | POA: Diagnosis not present

## 2019-09-26 DIAGNOSIS — R519 Headache, unspecified: Secondary | ICD-10-CM

## 2019-09-26 DIAGNOSIS — R42 Dizziness and giddiness: Secondary | ICD-10-CM

## 2019-09-26 DIAGNOSIS — M654 Radial styloid tenosynovitis [de Quervain]: Secondary | ICD-10-CM | POA: Diagnosis not present

## 2019-09-26 DIAGNOSIS — D3610 Benign neoplasm of peripheral nerves and autonomic nervous system, unspecified: Secondary | ICD-10-CM

## 2019-09-26 MED ORDER — DULOXETINE HCL 60 MG PO CPEP: 60.0000 mg | ORAL_CAPSULE | Freq: Every day | ORAL | 1 refills | Status: DC

## 2019-09-26 MED ORDER — CYCLOBENZAPRINE HCL 10 MG PO TABS: 10.0000 mg | ORAL_TABLET | Freq: Three times a day (TID) | ORAL | 4 refills | Status: DC | PRN

## 2019-09-26 NOTE — Telephone Encounter (Signed)
I reached out to the pt and left a vm asking her to call back so we could review her recent MRI study.

## 2019-09-26 NOTE — Progress Notes (Signed)
Please call patient regarding the recent brain MRI: The brain scan showed a normal structure of the brain and no significant volume loss which we call atrophy. There were changes in the deeper structures of the brain, which we call white matter changes or microvascular changes. These were reported as moderate in Her case. These are tiny white spots, that occur with time and are seen in a variety of conditions, including with normal aging, chronic hypertension, chronic headaches, especially migraine HAs, chronic diabetes, chronic hyperlipidemia. These are not strokes and no mass or lesion or contrast enhancement was seen, with the exception of a small lesion in the right auditory canal. I would favor consultation for a benign appearing lesion, called schwannoma with neurosurgery, mainly for consultation and future observation. If she is agreeable, place referral to [facility] for right sided schwannoma and HAs.  We will call her with the result of her sleep study.   No other action is required on this test at this time, other than re-enforcing the importance of good blood pressure control, good cholesterol control, good blood sugar control, and weight management. Please remind patient to keep any upcoming appointments or tests and to call us with any interim questions, concerns, problems or updates. Thanks,  Star Age, MD, PhD

## 2019-09-26 NOTE — Progress Notes (Signed)
1/18/202111:40 AM  Beverly Ramirez 11/30/1960, 59 y.o., female [id number]  Chief Complaint  Patient presents with  . Diabetes  . Fall    hurt shoulder and back    HPI:   Patient is a 59 y.o. female with past medical history significant for ulcerative colitis, HLP, HTN, DM2, fibromyalgiawho presents today for routine followup  Last OV nov 2020 - started atorvastatin Had colonoscopy for UC dec 2020 - colitis in remission, repeat in 5 years GI thinks she has IBS as well  She also had sleep study yesterday MRI brain - small intercanalicular schwannoma, otherwise bilateral periventricular and subcortical non specific white matter changes - referral to neurosurg made Neuro recommends ENT for tinnitus and vertigo  As colitis is well controlled - hand peeling and burning is not from her colitis, triamcinolone helps  Reports duloxetine providing mild improvement in pain from fibromyalgia She has been having several weeks of low back muscle spasms  Now having issues with deQuervain on the right wrist   Lab Results  Component Value Date   HGBA1C 7.1 (H) 07/25/2019   HGBA1C 7.6 (H) 03/25/2019   HGBA1C 9.5 (A) 11/10/2018   Lab Results  Component Value Date   LDLCALC 139 (H) 07/25/2019   CREATININE 0.75 08/16/2019    Depression screen PHQ 2/9 09/26/2019 08/18/2019 08/08/2019  Decreased Interest 0 0 0  Down, Depressed, Hopeless 0 0 0  PHQ - 2 Score 0 0 0    Fall Risk  09/26/2019 08/18/2019 08/08/2019 08/03/2019 07/25/2019  Falls in the past year? 1 0 - 0 0  Number falls in past yr: 1 0 0 0 0  Injury with Fall? 1 0 0 0 0  Follow up - - - Falls evaluation completed -     Allergies  Allergen Reactions  . Codeine Itching, Rash and Hives  . Latex Hives    With the power    Prior to Admission medications   Medication Sig Start Date End Date Taking? Authorizing Provider  metFORMIN (GLUCOPHAGE) 500 MG tablet TAKE 1 TABLET BY MOUTH TWICE DAILY FOR TWO WEEKS. THEN TAKE 2  TABLETS BY MOUTH TWICE DAILY. 02/22/19  Yes Rutherford Guys, MD  atorvastatin (LIPITOR) 20 MG tablet Take 1 tablet (20 mg total) by mouth daily. 07/28/19   Rutherford Guys, MD  DULoxetine (CYMBALTA) 30 MG capsule Take 1 capsule (30 mg total) by mouth daily. 04/26/19   Rutherford Guys, MD  fluticasone [facility]) 50 MCG/ACT nasal spray SPRAY 2 SPRAYS INTO EACH NOSTRIL EVERY DAY 09/11/19   Rutherford Guys, MD  Insulin Glargine (LANTUS SOLOSTAR) 100 UNIT/ML Solostar Pen Inject 20 Units into the skin at bedtime. 11/11/18   LampteyMyrene Galas, MD  Insulin Syringe-Needle U-100 (INSULIN SYRINGE 1CC/31GX5/16") 31G X 5/16" 1 ML MISC Inject 0.08 ( 8 Units)  into the skin 3 times daily before meals. 10/16/18   Rutherford Guys, MD  Lancets Brand [facility] DELICA PLUS [id number]) MISC 1 each by Does not apply route 3 (three) times daily. Dx E11.65, z79.4 01/24/19   Rutherford Guys, MD  magic mouthwash w/lidocaine SOLN Take 10 mLs by mouth every 2 (two) hours as needed for mouth pain. Patient not taking: Reported on 08/31/2019 07/25/19   Rutherford Guys, MD  mesalamine (LIALDA) 1.2 g EC tablet Take 2 tablets (2.4 g total) by mouth 2 (two) times daily. 11/25/18 11/20/19  Gatha Mayer, MD  triamcinolone cream (KENALOG) 0.1 % Apply 1 application topically 2 (two) times daily.  07/25/19   Rutherford Guys, MD    Past Medical History:  Diagnosis Date  . Anemia   . Carpal tunnel syndrome    bilateral  . Chronic heel pain   . Diabetes mellitus without complication ([place])   . DJD (degenerative joint disease) of cervical spine    MRI 2017  . Hyperlipidemia   . Plantar fasciitis    bilateral  . Sickle cell anemia ([facility])    sickle cell trait  . Sleep apnea   . Tendonitis    left hand/wrist  . UC (ulcerative colitis) ([place])     Past Surgical History:  Procedure Laterality Date  . ABDOMINAL HYSTERECTOMY    . APPENDECTOMY    . CESAREAN SECTION     x4  . COLONOSCOPY     Multiple in [place]  .  ESOPHAGOGASTRODUODENOSCOPY      Social History   Tobacco Use  . Smoking status: Former Smoker    Types: Cigarettes    Quit date: 10/13/2006    Years since quitting: 12.9  . Smokeless tobacco: Never Used  Substance Use Topics  . Alcohol use: Yes    Comment: occasional    Family History  Problem Relation Age of Onset  . Stroke Mother   . Diabetes Mother   . Hypertension Mother   . Lung cancer Mother   . Sickle cell trait Mother   . Breast cancer Maternal Aunt   . Diabetes Maternal Aunt   . Lung cancer Maternal Aunt   . Lung cancer Maternal Aunt   . Lung cancer Maternal Aunt   . Colon cancer Cousin 21  . Sickle cell trait Father   . Sickle cell anemia Sister 12  . Lung cancer Maternal Grandmother   . Sickle cell anemia Sister   . Other Sister        accident and blood clot formed    Review of Systems  Constitutional: Negative for chills and fever.  Respiratory: Negative for cough and shortness of breath.   Cardiovascular: Negative for chest pain, palpitations and leg swelling.  Gastrointestinal: Negative for abdominal pain, nausea and vomiting.     OBJECTIVE:  Today's Vitals   09/26/19 1119  BP: 140/85  Pulse: 69  Temp: (!) 97.5 F (36.4 C)  SpO2: 98%  Weight: 160 lb (72.6 kg)  Height: 5' 2"  (1.575 m)   Body mass index is 29.26 kg/m.   Physical Exam Vitals and nursing note reviewed.  Constitutional:      Appearance: She is well-developed.  HENT:     Head: Normocephalic and atraumatic.  Eyes:     General: No scleral icterus.    Conjunctiva/sclera: Conjunctivae normal.     Pupils: Pupils are equal, round, and reactive to light.  Pulmonary:     Effort: Pulmonary effort is normal.  Musculoskeletal:     Cervical back: Neck supple.  Skin:    General: Skin is warm and dry.  Neurological:     Mental Status: She is alert and oriented to person, place, and time.     No results found for this or any previous visit (from the past 24 hour(s)).  No  results found.   ASSESSMENT and PLAN  1. Fibromyalgia Partial improvement. Increasing duloxetine, adding flexeril. Reviewed r/se/b  2. Irritable bowel syndrome with diarrhea Discussed low fodmap diet, provided patient educational handouts   3. Vertigo 4. Tinnitus of both ears - Ambulatory referral to ENT  5. Chronic dermatitis of hands - Ambulatory referral  to Dermatology  6. De Quervain's tenosynovitis, right Discussed use of RICE therapy, spica splint. Consider seeing ortho  7. Ulcerative pancolitis without complication ([place]) Controlled. Managed by GI  Other orders - cyclobenzaprine (FLEXERIL) 10 MG tablet; Take 1 tablet (10 mg total) by mouth 3 (three) times daily as needed for muscle spasms. - DULoxetine (CYMBALTA) 60 MG capsule; Take 1 capsule (60 mg total) by mouth daily.  Return in about 4 months (around 01/24/2020).    Rutherford Guys, MD Primary Care at [facility] [place], [place] [address] Ph.  [phone] Fax [phone]

## 2019-09-26 NOTE — Patient Instructions (Signed)
° ° ° °  If you have lab work done today you will be contacted with your lab results within the next 2 weeks.  If you have not heard from us then please contact us. The fastest way to get your results is to register for My Chart. ° ° °IF you received an x-ray today, you will receive an invoice from [facility]. Please contact [facility] at [phone] with questions or concerns regarding your invoice.  ° °IF you received labwork today, you will receive an invoice from [facility]. Please contact [facility] at [phone] with questions or concerns regarding your invoice.  ° °Our billing staff will not be able to assist you with questions regarding bills from these companies. ° °You will be contacted with the lab results as soon as they are available. The fastest way to get your results is to activate your My Chart account. Instructions are located on the last page of this paperwork. If you have not heard from us regarding the results in 2 weeks, please contact this office. °  ° ° ° °

## 2019-09-26 NOTE — Telephone Encounter (Signed)
-----   Message from Star Age, MD sent at 09/26/2019  8:18 AM EST ----- Please call patient regarding the recent brain MRI: The brain scan showed a normal structure of the brain and no significant volume loss which we call atrophy. There were changes in the deeper structures of the brain, which we call white matter changes or microvascular changes. These were reported as moderate in Her case. These are tiny white spots, that occur with time and are seen in a variety of conditions, including with normal aging, chronic hypertension, chronic headaches, especially migraine HAs, chronic diabetes, chronic hyperlipidemia. These are not strokes and no mass or lesion or contrast enhancement was seen, with the exception of a small lesion in the right auditory canal. I would favor consultation for a benign appearing lesion, called schwannoma with neurosurgery, mainly for consultation and future observation. If she is agreeable, place referral to [place] for right sided schwannoma and HAs.  We will call her with the result of her sleep study.   No other action is required on this test at this time, other than re-enforcing the importance of good blood pressure control, good cholesterol control, good blood sugar control, and weight management. Please remind patient to keep any upcoming appointments or tests and to call us with any interim questions, concerns, problems or updates. Thanks,  Star Age, MD, PhD

## 2019-09-28 NOTE — Telephone Encounter (Signed)
I reached out to the Beverly Ramirez and advised of results. Beverly Ramirez was agreeable to referral to neurosurgery and had no questions at this time.   I have placed the requested order as well.

## 2019-09-28 NOTE — Addendum Note (Signed)
Addended by: Verlin Grills T on: 09/28/2019 02:53 PM   Modules accepted: Orders

## 2019-10-05 ENCOUNTER — Telehealth: Payer: Self-pay

## 2019-10-05 DIAGNOSIS — D497 Neoplasm of unspecified behavior of endocrine glands and other parts of nervous system: Secondary | ICD-10-CM | POA: Insufficient documentation

## 2019-10-05 DIAGNOSIS — D329 Benign neoplasm of meninges, unspecified: Secondary | ICD-10-CM | POA: Diagnosis not present

## 2019-10-05 NOTE — Progress Notes (Signed)
Patient referred by Dr. Pamella Pert for rec. HAs and previously for sleep d/o. Last seen by me on 08/23/19, diagnostic PSG on 09/25/19.    Please call and notify the patient that the recent sleep study showed mild to moderate OSA/obstructive sleep apnea. Given her sleep related complaints and rec. HAs, I recommend treatment for this in the form of autoPAP, which means, that we don't have to bring her back for a second sleep study with CPAP, but will let him try an autoPAP machine at home, through a DME company (of her choice, or as per insurance requirement). The DME representative will educate her on how to use the machine, how to put the mask on, etc. I have placed an order in the chart. Please send referral, talk to patient, send report to referring MD. We will need a FU in sleep clinic for 10 weeks post-PAP set up, please arrange that with me or one of our NPs. Thanks,   Star Age, MD, PhD [facility])

## 2019-10-05 NOTE — Procedures (Signed)
PATIENT'S NAME:  Beverly Ramirez, Beverly Ramirez DOB:      September 02, 1961      MR#:    [id number]     DATE OF RECORDING: 09/25/2019 REFERRING M.D.:  Grant Fontana MD Study Performed:   Baseline Polysomnogram HISTORY: 59 year old right-handed woman with an underlying medical history of ulcerative colitis, overweight state, hyperlipidemia, degenerative disc disease, anemia, as well as sleep disturbance, who reports recurrent headaches in the back of her head for the past several weeks. The patient endorsed the Epworth Sleepiness Scale at 8/24 points. The patient's weight 160 pounds with a height of 62 (inches), resulting in a BMI of 28.9 kg/m2. The patient's neck circumference measured 15 inches.  CURRENT MEDICATIONS: Lipitor, Cipro, Cymbalta, Flonase, Lantus, Lialda, Glucophage, Flagyl, Compazine, Promethazine, Kenalog.   PROCEDURE:  This is a multichannel digital polysomnogram utilizing the Somnostar 11.2 system.  Electrodes and sensors were applied and monitored per AASM Specifications.   EEG, EOG, Chin and Limb EMG, were sampled at 200 Hz.  ECG, Snore and Nasal Pressure, Thermal Airflow, Respiratory Effort, CPAP Flow and Pressure, Oximetry was sampled at 50 Hz. Digital video and audio were recorded.      BASELINE STUDY  Lights Out was at 22:32 and Lights On at 04:58.  Total recording time (TRT) was 386 minutes, with a total sleep time (TST) of 306.5 minutes.   The patient's sleep latency was 30.5 minutes, which is delayed. REM latency was 120.5 minutes, which is borderline high. The sleep efficiency was 79.4 %.     SLEEP ARCHITECTURE: WASO (Wake after sleep onset) was 63.5 minutes with mild sleep fragmentation noted. There were 5.5 minutes in Stage N1, 246.5 minutes Stage N2, 25.5 minutes Stage N3 and 29 minutes in Stage REM.  The percentage of Stage N1 was 1.8%, Stage N2 was 80.4%, which is markedly increased, Stage N3 was 8.3% and Stage R (REM sleep) was 9.5%, which is reduced. The arousals were noted as: 67  were spontaneous, 5 were associated with PLMs, 18 were associated with respiratory events.  RESPIRATORY ANALYSIS:  There were a total of 59 respiratory events:  7 obstructive apneas, 0 central apneas and 0 mixed apneas with a total of 7 apneas and an apnea index (AI) of 1.4 /hour. There were 52 hypopneas with a hypopnea index of 10.2 /hour. The patient also had 0 respiratory event related arousals (RERAs).      The total APNEA/HYPOPNEA INDEX (AHI) was 11.5/hour and the total RESPIRATORY DISTURBANCE INDEX was  11.5 /hour.  12 events occurred in REM sleep and 80 events in NREM. The REM AHI was  24.8 /hour, versus a non-REM AHI of 10.2. The patient spent 296 minutes of total sleep time in the supine position and 11 minutes in non-supine.. The supine AHI was 11.9 versus a non-supine AHI of 0.0.  OXYGEN SATURATION & C02:  The Wake baseline 02 saturation was 96%, with the lowest being 88%. Time spent below 89% saturation equaled 0 minutes.  PERIODIC LIMB MOVEMENTS: The patient had a total of 6 Periodic Limb Movements.  The Periodic Limb Movement (PLM) index was 1.2 and the PLM Arousal index was 1./hour.  Audio and video analysis did not show any abnormal or unusual movements, behaviors, phonations or vocalizations. The patient took 1 bathroom break. Mild snoring was noted. The EKG was in keeping with normal sinus rhythm (NSR).  Post-study, the patient indicated that sleep was the same as usual.   IMPRESSION:  1. Obstructive Sleep Apnea (OSA) 2. Dysfunctions associated with sleep  stages or arousal from sleep  RECOMMENDATIONS:  1. This study demonstrates overall mild obstructive sleep apnea, moderate during REM sleep with a total AHI of 11.5/hour, REM AHI of 24.8/hour, and O2 nadir of 88%. Given the patient's medical history and sleep related complaints, treatment with positive airway pressure is recommended; this can be achieved in the form of autoPAP. Alternatively, a full-night CPAP titration study  would allow optimization of therapy if needed. Other treatment options may include avoidance of supine sleep position along with weight loss, upper airway or jaw surgery in selected patients or the use of an oral appliance in certain patients. ENT evaluation and/or consultation with a maxillofacial surgeon or dentist may be feasible in some instances.    2. Please note that untreated obstructive sleep apnea may carry additional perioperative morbidity. Patients with significant obstructive sleep apnea should receive perioperative PAP therapy and the surgeons and particularly the anesthesiologist should be informed of the diagnosis and the severity of the sleep disordered breathing. 3. This study shows sleep fragmentation and abnormal sleep stage percentages; these are nonspecific findings and per se do not signify an intrinsic sleep disorder or a cause for the patient's sleep-related symptoms. Causes include (but are not limited to) the first night effect of the sleep study, circadian rhythm disturbances, medication effect or an underlying mood disorder or medical problem.  4. The patient should be cautioned not to drive, work at heights, or operate dangerous or heavy equipment when tired or sleepy. Review and reiteration of good sleep hygiene measures should be pursued with any patient. 5. The patient will be seen in follow-up by Dr. Rexene Alberts at [facility] for discussion of the test results and further management strategies. The referring provider will be notified of the test results.  I certify that I have reviewed the entire raw data recording prior to the issuance of this report in accordance with the Standards of Accreditation of the American Academy of Sleep Medicine (AASM)  Star Age, MD, PhD Diplomat, American Board of Neurology and Sleep Medicine (Neurology and Sleep Medicine)

## 2019-10-05 NOTE — Addendum Note (Signed)
Addended by: Star Age on: 10/05/2019 08:39 AM   Modules accepted: Orders

## 2019-10-05 NOTE — Telephone Encounter (Signed)
I reached out to the pt and we were able to discuss results.  PT was agreeable to starting auto pap therapy and chose [facility] as DME. Pt has been scheduled for f/u 01/04/2020 @ 1130 am. Letter will be mailed to pt to follow up on the information discussed during this call.

## 2019-10-05 NOTE — Telephone Encounter (Signed)
-----   Message from Star Age, MD sent at 10/05/2019  8:39 AM EST ----- Patient referred by Dr. Pamella Pert for rec. HAs and previously for sleep d/o. Last seen by me on 08/23/19, diagnostic PSG on 09/25/19.    Please call and notify the patient that the recent sleep study showed mild to moderate OSA/obstructive sleep apnea. Given her sleep related complaints and rec. HAs, I recommend treatment for this in the form of autoPAP, which means, that we don't have to bring her back for a second sleep study with CPAP, but will let him try an autoPAP machine at home, through a DME company (of her choice, or as per insurance requirement). The DME representative will educate her on how to use the machine, how to put the mask on, etc. I have placed an order in the chart. Please send referral, talk to patient, send report to referring MD. We will need a FU in sleep clinic for 10 weeks post-PAP set up, please arrange that with me or one of our NPs. Thanks,   Star Age, MD, PhD [facility])

## 2019-10-06 ENCOUNTER — Ambulatory Visit: Payer: PRIVATE HEALTH INSURANCE | Admitting: Nurse Practitioner

## 2019-10-11 ENCOUNTER — Other Ambulatory Visit: Payer: Self-pay | Admitting: Family Medicine

## 2019-10-11 NOTE — Telephone Encounter (Signed)
Requested Prescriptions  Pending Prescriptions Disp Refills  . fluticasone (FLONASE) 50 MCG/ACT nasal spray [Pharmacy Med Name: FLUTICASONE PROP 50 MCG SPRAY] 16 mL 0    Sig: SPRAY 2 SPRAYS INTO EACH NOSTRIL EVERY DAY     Ear, Nose, and Throat: Nasal Preparations - Corticosteroids Passed - 10/11/2019  9:34 AM      Passed - Valid encounter within last 12 months    Recent Outpatient Visits          2 weeks ago Fibromyalgia   [facility], Lilia Argue, MD   1 month ago New persistent daily headache   [facility], Lilia Argue, MD   2 months ago Fever, unspecified   [facility], Lilia Argue, MD   2 months ago Worst headache of life   [facility], Ranell Patrick, MD   2 months ago Type 2 diabetes mellitus with hyperglycemia, without long-term current use of insulin [facility])   [facility], Lilia Argue, MD      Future Appointments            In 3 months Rutherford Guys, MD [facility] [place], [facility]

## 2019-10-12 ENCOUNTER — Other Ambulatory Visit (INDEPENDENT_AMBULATORY_CARE_PROVIDER_SITE_OTHER): Payer: Medicare Other

## 2019-10-12 ENCOUNTER — Ambulatory Visit (INDEPENDENT_AMBULATORY_CARE_PROVIDER_SITE_OTHER): Payer: Medicare Other | Admitting: Nurse Practitioner

## 2019-10-12 ENCOUNTER — Encounter: Payer: Self-pay | Admitting: Nurse Practitioner

## 2019-10-12 VITALS — BP 124/70 | HR 64 | Temp 98.6°F | Ht 62.0 in | Wt 159.0 lb

## 2019-10-12 DIAGNOSIS — R197 Diarrhea, unspecified: Secondary | ICD-10-CM

## 2019-10-12 DIAGNOSIS — K51919 Ulcerative colitis, unspecified with unspecified complications: Secondary | ICD-10-CM | POA: Diagnosis not present

## 2019-10-12 DIAGNOSIS — R112 Nausea with vomiting, unspecified: Secondary | ICD-10-CM

## 2019-10-12 LAB — CBC WITH DIFFERENTIAL/PLATELET
Basophils Absolute: 0.1 10*3/uL (ref 0.0–0.1)
Basophils Relative: 0.6 % (ref 0.0–3.0)
Eosinophils Absolute: 0.8 10*3/uL — ABNORMAL HIGH (ref 0.0–0.7)
Eosinophils Relative: 8.6 % — ABNORMAL HIGH (ref 0.0–5.0)
HCT: 37.6 % (ref 36.0–46.0)
Hemoglobin: 12.8 g/dL (ref 12.0–15.0)
Lymphocytes Relative: 34.7 % (ref 12.0–46.0)
Lymphs Abs: 3.2 10*3/uL (ref 0.7–4.0)
MCHC: 33.9 g/dL (ref 30.0–36.0)
MCV: 84.4 fl (ref 78.0–100.0)
Monocytes Absolute: 0.7 10*3/uL (ref 0.1–1.0)
Monocytes Relative: 8 % (ref 3.0–12.0)
Neutro Abs: 4.4 10*3/uL (ref 1.4–7.7)
Neutrophils Relative %: 48.1 % (ref 43.0–77.0)
Platelets: 359 10*3/uL (ref 150.0–400.0)
RBC: 4.46 Mil/uL (ref 3.87–5.11)
RDW: 12.6 % (ref 11.5–15.5)
WBC: 9.2 10*3/uL (ref 4.0–10.5)

## 2019-10-12 LAB — SEDIMENTATION RATE: Sed Rate: 29 mm/hr (ref 0–30)

## 2019-10-12 LAB — C-REACTIVE PROTEIN: CRP: <1 mg/dL (ref 0.5–20.0)

## 2019-10-12 NOTE — Progress Notes (Signed)
IMPRESSION and PLAN:    38. 59 year old female with longstanding ulcerative colitis, maintained on Mesalamine and in remission based on last colonoscopy with biopsies December 2020. Now with flare like symptoms.  Nausea, vomiting, intermittent non-bloody diarrhea over the last 2 weeks.  Rule out UC flare, rule out infectious etiology such as C. Difficile.  Not likely,  but still consider some relationship to recent increase in Cymbalta dosage -Continue mesalamine -She has an antiemetic at home, encouraged her to use it as needed. -Check stool for C. Difficile -Check CRP, ESR -will call her with results of above and further recommendations.  Of note she wants to avoid prednisone in the case of any future UC flare      HPI:    Primary GI: Dr. Carlean Purl  Chief complaint : nausea, vomiting, diarrhea   Patient is a 59 yo female with MH significant for DM2, fibromyalgia and ulcerative colitis . For ulcerative colitis maintained on mesalamine.  In the past she has taken prednisone, wants to avoid it if possible.  Cost has been an issue with medications so never treated with biologics.  Patient was seen in the office early December by Carl Best, NP.  She described recurrence of aphthous ulcers, lower abdominal pain and mild bowel changes.  Meloxicam was discontinued.  Patient was given a course of Cipro and Flagyl and scheduled for colonoscopy for further evaluation.  Colonoscopy with biopsies sure that her colitis was quiescent.  Almendra returns today with complaints of nausea, vomiting and diarrhea.  Her symptoms started 2 weeks ago.  She is able to tolerate soup and Jell-O but not more solid foods.   Generally she has a formed stool once daily but over the last 2 weeks has been having intermittent.  Yesterday she had a mixture of solid and loose stool, today she had no diarrhea.  She has not seen any blood in the stool.  No fevers.  No recent antibiotics, the last one she  took was what we gave her in early December.  She has not started any new medications but 2 weeks ago her Cymbalta dose was increased and it is helping her fibromyalgia symptoms.  She has diabetes, on Metformin.  Checking CBGs at home.  Blood sugar fluctuating but highest reading has been around 167 . For the aphthous ulcer she has been using mouthwash which helps with the discomfort.  Review of systems:     No chest pain, no SOB, no fevers, no urinary sx   Past Medical History:  Diagnosis Date  . Anemia   . Carpal tunnel syndrome    bilateral  . Chronic heel pain   . Diabetes mellitus without complication ([place])   . DJD (degenerative joint disease) of cervical spine    MRI 2017  . Hyperlipidemia   . Plantar fasciitis    bilateral  . Sickle cell anemia (HCC)    sickle cell trait  . Sleep apnea   . Tendonitis    left hand/wrist  . UC (ulcerative colitis) ([place])     Patient's surgical history, family medical history, social history, medications and allergies were all reviewed in Epic   Creatinine clearance cannot be calculated (Patient's most recent lab result is older than the maximum 21 days allowed.)  Current Outpatient Medications  Medication Sig Dispense Refill  . atorvastatin (LIPITOR) 20 MG tablet Take 1 tablet (20 mg total) by mouth daily. 90 tablet 3  . cyclobenzaprine (FLEXERIL)  10 MG tablet Take 1 tablet (10 mg total) by mouth 3 (three) times daily as needed for muscle spasms. 30 tablet 4  . DULoxetine (CYMBALTA) 60 MG capsule Take 1 capsule (60 mg total) by mouth daily. 90 capsule 1  . fluticasone (FLONASE) 50 MCG/ACT nasal spray SPRAY 2 SPRAYS INTO EACH NOSTRIL EVERY DAY 16 mL 0  . Insulin Glargine (LANTUS SOLOSTAR) 100 UNIT/ML Solostar Pen Inject 20 Units into the skin at bedtime. 5 pen 0  . Insulin Syringe-Needle U-100 (INSULIN SYRINGE 1CC/31GX5/16") 31G X 5/16" 1 ML MISC Inject 0.08 ( 8 Units)  into the skin 3 times daily before meals. 100 each 3  . Lancets (ONETOUCH  DELICA PLUS [id number]) MISC 1 each by Does not apply route 3 (three) times daily. Dx E11.65, z79.4 100 each 11  . magic mouthwash w/lidocaine SOLN Take 10 mLs by mouth every 2 (two) hours as needed for mouth pain. 360 mL 4  . mesalamine (LIALDA) 1.2 g EC tablet Take 2 tablets (2.4 g total) by mouth 2 (two) times daily. 360 tablet 3  . metFORMIN (GLUCOPHAGE) 500 MG tablet TAKE 1 TABLET BY MOUTH TWICE DAILY FOR TWO WEEKS. THEN TAKE 2 TABLETS BY MOUTH TWICE DAILY. 360 tablet 2  . triamcinolone cream (KENALOG) 0.1 % Apply 1 application topically 2 (two) times daily. 30 g 2   No current facility-administered medications for this visit.    Physical Exam:     BP 124/70   Pulse 64   Temp 98.6 F (37 C)   Ht 5' 2"  (1.575 m)   Wt 159 lb (72.1 kg)   BMI 29.08 kg/m   GENERAL:  Pleasant female in NAD PSYCH: : Cooperative, normal affect Oral cavity   A few tiny, superficial gum ulcers.  CARDIAC:  RRR, no murmur heard, no peripheral edema PULM: Normal respiratory effort, lungs CTA bilaterally, no wheezing ABDOMEN:  Nondistended, soft, mild diffuse lower abdominal tenderness.  No obvious masses, no hepatomegaly,  normal bowel sounds SKIN:  turgor, no lesions seen Musculoskeletal:  Normal muscle tone, normal strength NEURO: Alert and oriented x 3, no focal neurologic deficits   Tye Savoy , NP 10/12/2019, 10:12 AM

## 2019-10-12 NOTE — Patient Instructions (Addendum)
If you are age 58 or older, your body mass index should be between 23-30. Your Body mass index is 29.08 kg/m. If this is out of the aforementioned range listed, please consider follow up with your Primary Care Provider.  If you are age 79 or younger, your body mass index should be between 19-25. Your Body mass index is 29.08 kg/m. If this is out of the aformentioned range listed, please consider follow up with your Primary Care Provider.   Your provider has requested that you go to the basement level for lab work before leaving today. Press "B" on the elevator. The lab is located at the first door on the left as you exit the elevator.  Use the antiemetic as prescribed by PCP.   Due to recent changes in healthcare laws, you may see the results of your imaging and laboratory studies on MyChart before your provider has had a chance to review them.  We understand that in some cases there may be results that are confusing or concerning to you. Not all laboratory results come back in the same time frame and the provider may be waiting for multiple results in order to interpret others.  Please give [place] 48 hours in order for your provider to thoroughly review all the results before contacting the office for clarification of your results.

## 2019-10-14 ENCOUNTER — Ambulatory Visit: Payer: PRIVATE HEALTH INSURANCE | Admitting: Nurse Practitioner

## 2019-10-14 ENCOUNTER — Other Ambulatory Visit: Payer: Medicare Other

## 2019-10-14 DIAGNOSIS — K51919 Ulcerative colitis, unspecified with unspecified complications: Secondary | ICD-10-CM | POA: Diagnosis not present

## 2019-10-19 LAB — CLOSTRIDIUM DIFFICILE TOXIN B, QUALITATIVE, REAL-TIME PCR: Toxigenic C. Difficile by PCR: NOT DETECTED

## 2019-10-20 DIAGNOSIS — L308 Other specified dermatitis: Secondary | ICD-10-CM | POA: Diagnosis not present

## 2019-11-03 ENCOUNTER — Ambulatory Visit (HOSPITAL_COMMUNITY)
Admission: EM | Admit: 2019-11-03 | Discharge: 2019-11-03 | Disposition: A | Discharge status: Home / Self Care | Payer: Medicare Other | Attending: Family Medicine | Admitting: Family Medicine

## 2019-11-03 ENCOUNTER — Encounter (HOSPITAL_COMMUNITY): Payer: Self-pay

## 2019-11-03 ENCOUNTER — Other Ambulatory Visit: Payer: Self-pay

## 2019-11-03 DIAGNOSIS — M109 Gout, unspecified: Secondary | ICD-10-CM | POA: Diagnosis not present

## 2019-11-03 DIAGNOSIS — M79674 Pain in right toe(s): Secondary | ICD-10-CM

## 2019-11-03 DIAGNOSIS — E119 Type 2 diabetes mellitus without complications: Secondary | ICD-10-CM | POA: Diagnosis not present

## 2019-11-03 LAB — CBG MONITORING, ED
Glucose-Capillary: 184 mg/dL — ABNORMAL HIGH (ref 70–99)
Glucose-Capillary: 184 mg/dL — ABNORMAL HIGH (ref 70–99)

## 2019-11-03 LAB — GLUCOSE, CAPILLARY: Glucose-Capillary: 184 mg/dL — ABNORMAL HIGH (ref 70–99)

## 2019-11-03 MED ORDER — PREDNISONE 20 MG PO TABS: 20.0000 mg | ORAL_TABLET | Freq: Two times a day (BID) | ORAL | 0 refills | Status: AC

## 2019-11-03 NOTE — ED Provider Notes (Signed)
[facility]    CSN: [id number] Arrival date & time: 11/03/19  1815      History   Chief Complaint Chief Complaint  Patient presents with  . Toe Pain    HPI Beverly Ramirez is a 59 y.o. female history of DM type II, ulcerative colitis, fibromyalgia, presenting today for evaluation of right toe pain.  Patient states that over the past 3 days she has had a burning sensation mainly in her great toe, but feels it at the base of her other toes as well.  She has noticed some swelling that is extending more proximally.  Denies any injury or fall.  Denies symptoms on right left foot.  Denies burning tingling at baseline.  Check sugars at home and reports sugars are in the 200s.  HPI  Past Medical History:  Diagnosis Date  . Anemia   . Carpal tunnel syndrome    bilateral  . Chronic heel pain   . Diabetes mellitus without complication ([place])   . DJD (degenerative joint disease) of cervical spine    MRI 2017  . Hyperlipidemia   . Plantar fasciitis    bilateral  . Sickle cell anemia (HCC)    sickle cell trait  . Sleep apnea   . Tendonitis    left hand/wrist  . UC (ulcerative colitis) [facility])     Patient Active Problem List   Diagnosis Date Noted  . De Quervain's tenosynovitis, left 11/16/2018  . Bilateral carpal tunnel syndrome 04/13/2018  . Leukocytosis 10/04/2017  . Hyperlipidemia 08/04/2017  . Ulcerative colitis without complications ([place]) 50/35/4656  . Plantar fasciitis 05/28/2017    Past Surgical History:  Procedure Laterality Date  . ABDOMINAL HYSTERECTOMY    . APPENDECTOMY    . CESAREAN SECTION     x4  . COLONOSCOPY     Multiple in [place]  . ESOPHAGOGASTRODUODENOSCOPY      OB History    Gravida  4   Para  4   Term      Preterm      AB      Living  4     SAB      TAB      Ectopic      Multiple      Live Births               Home Medications    Prior to Admission medications   Medication Sig Start Date End Date  Taking? Authorizing Provider  atorvastatin (LIPITOR) 20 MG tablet Take 1 tablet (20 mg total) by mouth daily. 07/28/19   Rutherford Guys, MD  cyclobenzaprine (FLEXERIL) 10 MG tablet Take 1 tablet (10 mg total) by mouth 3 (three) times daily as needed for muscle spasms. 09/26/19   Rutherford Guys, MD  DULoxetine (CYMBALTA) 60 MG capsule Take 1 capsule (60 mg total) by mouth daily. 09/26/19   Rutherford Guys, MD  fluticasone [facility]) 50 MCG/ACT nasal spray SPRAY 2 SPRAYS INTO EACH NOSTRIL EVERY DAY 10/11/19   Rutherford Guys, MD  Insulin Glargine (LANTUS SOLOSTAR) 100 UNIT/ML Solostar Pen Inject 20 Units into the skin at bedtime. 11/11/18   LampteyMyrene Galas, MD  Insulin Syringe-Needle U-100 (INSULIN SYRINGE 1CC/31GX5/16") 31G X 5/16" 1 ML MISC Inject 0.08 ( 8 Units)  into the skin 3 times daily before meals. 10/16/18   Rutherford Guys, MD  Lancets [facility] DELICA PLUS [id number]) MISC 1 each by Does not apply route 3 (three) times daily. Dx  E11.65, z79.4 01/24/19   Rutherford Guys, MD  mesalamine (LIALDA) 1.2 g EC tablet Take 2 tablets (2.4 g total) by mouth 2 (two) times daily. 11/25/18 11/20/19  Gatha Mayer, MD  metFORMIN (GLUCOPHAGE) 500 MG tablet TAKE 1 TABLET BY MOUTH TWICE DAILY FOR TWO WEEKS. THEN TAKE 2 TABLETS BY MOUTH TWICE DAILY. 02/22/19   Rutherford Guys, MD  predniSONE (DELTASONE) 20 MG tablet Take 1 tablet (20 mg total) by mouth 2 (two) times daily with a meal for 5 days. 11/03/19 11/08/19  Yitzel Shasteen, Elesa Hacker, PA-C    Family History Family History  Problem Relation Age of Onset  . Stroke Mother   . Diabetes Mother   . Hypertension Mother   . Lung cancer Mother   . Sickle cell trait Mother   . Breast cancer Maternal Aunt   . Diabetes Maternal Aunt   . Lung cancer Maternal Aunt   . Lung cancer Maternal Aunt   . Lung cancer Maternal Aunt   . Colon cancer Cousin 32  . Sickle cell trait Father   . Sickle cell anemia Sister 3  . Lung cancer Maternal Grandmother   . Sickle cell  anemia Sister   . Other Sister        accident and blood clot formed    Social History Social History   Tobacco Use  . Smoking status: Former Smoker    Types: Cigarettes    Quit date: 10/13/2006    Years since quitting: 13.0  . Smokeless tobacco: Never Used  Substance Use Topics  . Alcohol use: Yes    Comment: occasional  . Drug use: No     Allergies   Codeine and Latex   Review of Systems Review of Systems  Constitutional: Negative for fatigue and fever.  Eyes: Negative for visual disturbance.  Respiratory: Negative for shortness of breath.   Cardiovascular: Negative for chest pain.  Gastrointestinal: Negative for abdominal pain, nausea and vomiting.  Musculoskeletal: Positive for arthralgias, gait problem and joint swelling.  Skin: Positive for color change. Negative for rash and wound.  Neurological: Negative for dizziness, weakness, light-headedness and headaches.     Physical Exam Triage Vital Signs ED Triage Vitals  Enc Vitals Group     BP 11/03/19 1827 135/84     Pulse Rate 11/03/19 1827 89     Resp 11/03/19 1827 16     Temp 11/03/19 1827 98.3 F (36.8 C)     Temp Source 11/03/19 1827 Oral     SpO2 11/03/19 1827 100 %     Weight 11/03/19 1826 160 lb (72.6 kg)     Height --      Head Circumference --      Peak Flow --      Pain Score 11/03/19 1825 8     Pain Loc --      Pain Edu? --      Excl. in [place]? --    No data found.  Updated Vital Signs BP 135/84 (BP Location: Right Arm)   Pulse 89   Temp 98.3 F (36.8 C) (Oral)   Resp 16   Wt 160 lb (72.6 kg)   SpO2 100%   BMI 29.26 kg/m   Visual Acuity Right Eye Distance:   Left Eye Distance:   Bilateral Distance:    Right Eye Near:   Left Eye Near:    Bilateral Near:     Physical Exam Vitals and nursing note reviewed.  Constitutional:  Appearance: She is well-developed.     Comments: No acute distress  HENT:     Head: Normocephalic and atraumatic.     Nose: Nose normal.  Eyes:      Conjunctiva/sclera: Conjunctivae normal.  Cardiovascular:     Rate and Rhythm: Normal rate.  Pulmonary:     Effort: Pulmonary effort is normal. No respiratory distress.  Abdominal:     General: There is no distension.  Musculoskeletal:        General: Normal range of motion.     Cervical back: Neck supple.     Comments: Right foot: Swelling and faint erythema with warmth noted to base of right great toe, tenderness extending slightly more proximally up the first metatarsal and more laterally at base of toes, point of maximal tenderness at base of great toe, no nailbed erythema Dorsalis pedis 2+ Sensation intact distally  Skin:    General: Skin is warm and dry.  Neurological:     Mental Status: She is alert and oriented to person, place, and time.      UC Treatments / Results  Labs (all labs ordered are listed, but only abnormal results are displayed) Labs Reviewed  GLUCOSE, CAPILLARY - Abnormal; Notable for the following components:      Result Value   Glucose-Capillary 184 (*)    All other components within normal limits  CBG MONITORING, ED - Abnormal; Notable for the following components:   Glucose-Capillary 184 (*)    All other components within normal limits  CBG MONITORING, ED - Abnormal; Notable for the following components:   Glucose-Capillary 184 (*)    All other components within normal limits    EKG   Radiology No results found.  Procedures Procedures (including critical care time)  Medications Ordered in UC Medications - No data to display  Initial Impression / Assessment and Plan / UC Course  I have reviewed the triage vital signs and the nursing notes.  Pertinent labs & imaging results that were available during my care of the patient were reviewed by me and considered in my medical decision making (see chart for details).     Blood sugar 184 today.  Exam suggestive of likely gout.  Less concerning for cellulitis at this time. No injury. Placing  on course of prednisone to take daily for the next 5 days.  Discussed elevation of sugars associated with prednisone.  Ice and elevate.  Advised to follow-up with primary care for further monitoring and management of diabetes.  Advised if developing persistent burning sensation may have underlying neuropathy.  Discussed strict return precautions. Patient verbalized understanding and is agreeable with plan.  Final Clinical Impressions(s) / UC Diagnoses   Final diagnoses:  Great toe pain, right  Acute gout involving toe of right foot, unspecified cause     Discharge Instructions     Pain likely from gout Begin prednisone twice daily for 5 days Ice and elevate foot Rest  as much as possible Follow up if pain, redness, swelling not improving  Blood sugar 184 today, monitor sugars on prednisone, follow up with primary for Diabetes management    ED Prescriptions    Medication Sig Dispense Auth. Provider   predniSONE (DELTASONE) 20 MG tablet Take 1 tablet (20 mg total) by mouth 2 (two) times daily with a meal for 5 days. 10 tablet Malorie Bigford, Dola C, PA-C     PDMP not reviewed this encounter.   Janith Lima, [place] 11/03/19 1857

## 2019-11-03 NOTE — ED Triage Notes (Signed)
Pt states she has right foot big toe pain. Pt states it's swelling and burning. X 3 days.

## 2019-11-03 NOTE — Discharge Instructions (Addendum)
Pain likely from gout Begin prednisone twice daily for 5 days Ice and elevate foot Rest  as much as possible Follow up if pain, redness, swelling not improving  Blood sugar 184 today, monitor sugars on prednisone, follow up with primary for Diabetes management

## 2019-11-11 ENCOUNTER — Other Ambulatory Visit: Payer: Self-pay | Admitting: Family Medicine

## 2019-11-18 NOTE — Telephone Encounter (Signed)
No action needed

## 2019-12-14 ENCOUNTER — Other Ambulatory Visit: Payer: Self-pay | Admitting: Family Medicine

## 2019-12-15 ENCOUNTER — Encounter (HOSPITAL_COMMUNITY): Payer: Self-pay | Admitting: Emergency Medicine

## 2019-12-15 ENCOUNTER — Other Ambulatory Visit: Payer: Self-pay

## 2019-12-15 ENCOUNTER — Emergency Department (HOSPITAL_COMMUNITY)
Admission: EM | Admit: 2019-12-15 | Discharge: 2019-12-16 | Disposition: A | Discharge status: Home / Self Care | Payer: Medicare Other | Attending: Emergency Medicine | Admitting: Emergency Medicine

## 2019-12-15 ENCOUNTER — Telehealth (INDEPENDENT_AMBULATORY_CARE_PROVIDER_SITE_OTHER): Payer: Medicare Other | Admitting: Family Medicine

## 2019-12-15 DIAGNOSIS — R252 Cramp and spasm: Secondary | ICD-10-CM | POA: Insufficient documentation

## 2019-12-15 DIAGNOSIS — Z87891 Personal history of nicotine dependence: Secondary | ICD-10-CM | POA: Diagnosis not present

## 2019-12-15 DIAGNOSIS — R5383 Other fatigue: Secondary | ICD-10-CM | POA: Diagnosis not present

## 2019-12-15 DIAGNOSIS — Z91040 Latex allergy status: Secondary | ICD-10-CM | POA: Diagnosis not present

## 2019-12-15 DIAGNOSIS — R631 Polydipsia: Secondary | ICD-10-CM | POA: Diagnosis not present

## 2019-12-15 DIAGNOSIS — R112 Nausea with vomiting, unspecified: Secondary | ICD-10-CM

## 2019-12-15 DIAGNOSIS — Z79899 Other long term (current) drug therapy: Secondary | ICD-10-CM | POA: Insufficient documentation

## 2019-12-15 DIAGNOSIS — Z794 Long term (current) use of insulin: Secondary | ICD-10-CM | POA: Diagnosis not present

## 2019-12-15 DIAGNOSIS — R739 Hyperglycemia, unspecified: Secondary | ICD-10-CM

## 2019-12-15 DIAGNOSIS — R634 Abnormal weight loss: Secondary | ICD-10-CM

## 2019-12-15 DIAGNOSIS — E1165 Type 2 diabetes mellitus with hyperglycemia: Secondary | ICD-10-CM

## 2019-12-15 DIAGNOSIS — E119 Type 2 diabetes mellitus without complications: Secondary | ICD-10-CM

## 2019-12-15 LAB — URINALYSIS, ROUTINE W REFLEX MICROSCOPIC
Bacteria, UA: NONE SEEN
Bilirubin Urine: NEGATIVE
Glucose, UA: >=500 mg/dL — AB
Hgb urine dipstick: NEGATIVE
Ketones, ur: 5 mg/dL — AB
Leukocytes,Ua: NEGATIVE
Nitrite: NEGATIVE
Protein, ur: NEGATIVE mg/dL
Specific Gravity, Urine: 1.022 (ref 1.005–1.030)
pH: 6 (ref 5.0–8.0)

## 2019-12-15 LAB — BASIC METABOLIC PANEL
Anion gap: 10 (ref 5–15)
Anion gap: 10 (ref 5–15)
Anion gap: 7 (ref 5–15)
BUN: 16 mg/dL (ref 6–20)
BUN: 15 mg/dL (ref 6–20)
BUN: 12 mg/dL (ref 6–20)
CO2: 25 mmol/L (ref 22–32)
CO2: 24 mmol/L (ref 22–32)
CO2: 26 mmol/L (ref 22–32)
Calcium: 9.6 mg/dL (ref 8.9–10.3)
Calcium: 9.4 mg/dL (ref 8.9–10.3)
Calcium: 9.1 mg/dL (ref 8.9–10.3)
Chloride: 93 mmol/L — ABNORMAL LOW (ref 98–111)
Chloride: 93 mmol/L — ABNORMAL LOW (ref 98–111)
Chloride: 106 mmol/L (ref 98–111)
Creatinine, Ser: 1.01 mg/dL — ABNORMAL HIGH (ref 0.44–1.00)
Creatinine, Ser: 0.84 mg/dL (ref 0.44–1.00)
Creatinine, Ser: 0.79 mg/dL (ref 0.44–1.00)
GFR calc Af Amer: >60 mL/min (ref >60)
GFR calc Af Amer: >60 mL/min (ref >60)
GFR calc Af Amer: >60 mL/min (ref >60)
GFR calc non Af Amer: >60 mL/min (ref >60)
GFR calc non Af Amer: >60 mL/min (ref >60)
GFR calc non Af Amer: >60 mL/min (ref >60)
Glucose, Bld: 867 mg/dL (ref 70–99)
Glucose, Bld: 702 mg/dL (ref 70–99)
Glucose, Bld: 308 mg/dL — ABNORMAL HIGH (ref 70–99)
Potassium: 5 mmol/L (ref 3.5–5.1)
Potassium: 4.4 mmol/L (ref 3.5–5.1)
Potassium: 3.7 mmol/L (ref 3.5–5.1)
Sodium: 128 mmol/L — ABNORMAL LOW (ref 135–145)
Sodium: 127 mmol/L — ABNORMAL LOW (ref 135–145)
Sodium: 139 mmol/L (ref 135–145)

## 2019-12-15 LAB — CBC
HCT: 39.9 % (ref 36.0–46.0)
Hemoglobin: 13.3 g/dL (ref 12.0–15.0)
MCH: 28.5 pg (ref 26.0–34.0)
MCHC: 33.3 g/dL (ref 30.0–36.0)
MCV: 85.6 fL (ref 80.0–100.0)
Platelets: 340 10*3/uL (ref 150–400)
RBC: 4.66 MIL/uL (ref 3.87–5.11)
RDW: 12 % (ref 11.5–15.5)
WBC: 12 10*3/uL — ABNORMAL HIGH (ref 4.0–10.5)
nRBC: 0 % (ref 0.0–0.2)

## 2019-12-15 LAB — BLOOD GAS, VENOUS
Acid-base deficit: 0.4 mmol/L (ref 0.0–2.0)
Bicarbonate: 23.8 mmol/L (ref 20.0–28.0)
O2 Saturation: 92.8 %
Patient temperature: 98.6
pCO2, Ven: 39.5 mmHg — ABNORMAL LOW (ref 44.0–60.0)
pH, Ven: 7.398 (ref 7.250–7.430)
pO2, Ven: 72 mmHg — ABNORMAL HIGH (ref 32.0–45.0)

## 2019-12-15 LAB — CBG MONITORING, ED
Glucose-Capillary: >600 mg/dL (ref 70–99)
Glucose-Capillary: >600 mg/dL (ref 70–99)
Glucose-Capillary: 460 mg/dL — ABNORMAL HIGH (ref 70–99)
Glucose-Capillary: 403 mg/dL — ABNORMAL HIGH (ref 70–99)

## 2019-12-15 MED ORDER — SODIUM CHLORIDE 0.9 % IV BOLUS (SEPSIS)
1000.0000 mL | Freq: Once | INTRAVENOUS | Status: AC
  Administered 2019-12-15: 20:00:00 1000 mL via INTRAVENOUS

## 2019-12-15 MED ORDER — INSULIN ASPART 100 UNIT/ML ~~LOC~~ SOLN
12.0000 [IU] | Freq: Once | SUBCUTANEOUS | Status: AC
  Administered 2019-12-15: 12 [IU] via SUBCUTANEOUS
  Filled 2019-12-15: qty 0.12

## 2019-12-15 MED ORDER — SODIUM CHLORIDE 0.9 % IV BOLUS (SEPSIS)
1000.0000 mL | Freq: Once | INTRAVENOUS | Status: AC
  Administered 2019-12-15: 1000 mL via INTRAVENOUS

## 2019-12-15 MED ORDER — DIAZEPAM 2 MG PO TABS
2.0000 mg | ORAL_TABLET | Freq: Once | ORAL | Status: AC
  Administered 2019-12-15: 2 mg via ORAL
  Filled 2019-12-15: qty 1

## 2019-12-15 MED ORDER — SODIUM CHLORIDE 0.9 % IV SOLN
1000.0000 mL | INTRAVENOUS | Status: DC
  Administered 2019-12-15: 1000 mL via INTRAVENOUS

## 2019-12-15 NOTE — ED Triage Notes (Signed)
Pt was on metformin but was taken off 2 weeks ago. Reports her sugars have been up and down. Reading high. Not currently taking insulin either.

## 2019-12-15 NOTE — ED Notes (Addendum)
Date and time results received: 12/15/19 1851  Test: Glucose Critical Value: 867  Name of Provider Notified: Regenia Skeeter MD

## 2019-12-15 NOTE — Progress Notes (Signed)
Virtual Visit via Video Note  I connected with Beverly Ramirez on 12/15/19 at 4:39 PM by a video enabled telemedicine application and verified that I am speaking with the correct person using two identifiers.   I discussed the limitations, risks, security and privacy concerns of performing an evaluation and management service by telephone and the availability of in person appointments. I also discussed with the patient that there may be a patient responsible charge related to this service. The patient expressed understanding and agreed to proceed, consent obtained  Chief complaint: Chief Complaint  Patient presents with  . Fatigue    with weakness x 2 weeks getting worse  . Emesis    per pt started yesterday and drinking water/Gatorade, per pt she has a Hx of Ulcerative Colitis    History of Present Illness: Beverly Ramirez is a 59 y.o. female  Fatigue: Past 2 weeks. Generalized weakness, tired, drained. Drinking lots of water - 8 bottles of water per day. Drinking gatorade as well. Losing weight - 166 dow to 148 past 2 weeks.  Blood sugar has been running high - 200's last week. Today is reading too high to read both times to read today.   Prior on insulin, stopped due to better readings earlier this year - only on metformin.   Emesis since yesterday - 3 episodes.    Patient Active Problem List   Diagnosis Date Noted  . De Quervain's tenosynovitis, left 11/16/2018  . Bilateral carpal tunnel syndrome 04/13/2018  . Leukocytosis 10/04/2017  . Hyperlipidemia 08/04/2017  . Ulcerative colitis without complications ([place]) 75/64/3329  . Plantar fasciitis 05/28/2017   Past Medical History:  Diagnosis Date  . Anemia   . Carpal tunnel syndrome    bilateral  . Chronic heel pain   . Diabetes mellitus without complication ([place])   . DJD (degenerative joint disease) of cervical spine    MRI 2017  . Hyperlipidemia   . Plantar fasciitis    bilateral  . Sickle cell anemia (HCC)     sickle cell trait  . Sleep apnea   . Tendonitis    left hand/wrist  . UC (ulcerative colitis) ([place])    Past Surgical History:  Procedure Laterality Date  . ABDOMINAL HYSTERECTOMY    . APPENDECTOMY    . CESAREAN SECTION     x4  . COLONOSCOPY     Multiple in [place]  . ESOPHAGOGASTRODUODENOSCOPY     Allergies  Allergen Reactions  . Codeine Itching, Rash and Hives  . Latex Hives    With the power   Prior to Admission medications   Medication Sig Start Date End Date Taking? Authorizing Provider  atorvastatin (LIPITOR) 20 MG tablet Take 1 tablet (20 mg total) by mouth daily. 07/28/19  Yes Rutherford Guys, MD  cyclobenzaprine (FLEXERIL) 10 MG tablet Take 1 tablet (10 mg total) by mouth 3 (three) times daily as needed for muscle spasms. 09/26/19  Yes Rutherford Guys, MD  DULoxetine (CYMBALTA) 60 MG capsule Take 1 capsule (60 mg total) by mouth daily. 09/26/19  Yes Rutherford Guys, MD  fluticasone [facility]) 50 MCG/ACT nasal spray SPRAY 2 SPRAYS INTO EACH NOSTRIL EVERY DAY 12/14/19  Yes Rutherford Guys, MD  Insulin Glargine (LANTUS SOLOSTAR) 100 UNIT/ML Solostar Pen Inject 20 Units into the skin at bedtime. 11/11/18  Yes Lamptey, Myrene Galas, MD  metFORMIN (GLUCOPHAGE) 500 MG tablet TAKE 1 TABLET BY MOUTH TWICE DAILY FOR TWO WEEKS. THEN TAKE 2 TABLETS BY MOUTH TWICE DAILY.  02/22/19  Yes Rutherford Guys, MD  Insulin Syringe-Needle U-100 (INSULIN SYRINGE 1CC/31GX5/16") 31G X 5/16" 1 ML MISC Inject 0.08 ( 8 Units)  into the skin 3 times daily before meals. 10/16/18   Rutherford Guys, MD  Lancets [facility] DELICA PLUS [id number]) MISC 1 each by Does not apply route 3 (three) times daily. Dx E11.65, z79.4 01/24/19   Rutherford Guys, MD  mesalamine (LIALDA) 1.2 g EC tablet Take 2 tablets (2.4 g total) by mouth 2 (two) times daily. 11/25/18 11/20/19  Gatha Mayer, MD   Social History   Socioeconomic History  . Marital status: Married    Spouse name: Simona Huh  . Number of children: 4  . Years of  education: Not on file  . Highest education level: Not on file  Occupational History  . Occupation: retired    Comment: He formerly worked for [employer] of [employer], disabled due to a fall  Tobacco Use  . Smoking status: Former Smoker    Types: Cigarettes    Quit date: 10/13/2006    Years since quitting: 13.1  . Smokeless tobacco: Never Used  Substance and Sexual Activity  . Alcohol use: Yes    Comment: occasional  . Drug use: No  . Sexual activity: Yes    Partners: Male    Comment: 1ST intercourse- 70, partners- 35, married- 4 yrs   Other Topics Concern  . Not on file  Social History Narrative   She is married, 3 sons born 1979, 1981, 1984.  One daughter born in 54.   She is medically retired from the [employer] after a fall and a hip injury, around 2004.   Caffeinated beverage every other day x1   07/13/2017   Social Determinants of Health   Financial Resource Strain:   . Difficulty of Paying Living Expenses:   Food Insecurity:   . Worried About Charity fundraiser in the Last Year:   . Arboriculturist in the Last Year:   Transportation Needs:   . Film/video editor (Medical):   Marland Kitchen Lack of Transportation (Non-Medical):   Physical Activity:   . Days of Exercise per Week:   . Minutes of Exercise per Session:   Stress:   . Feeling of Stress :   Social Connections:   . Frequency of Communication with Friends and Family:   . Frequency of Social Gatherings with Friends and Family:   . Attends Religious Services:   . Active Member of Clubs or Organizations:   . Attends Archivist Meetings:   Marland Kitchen Marital Status:   Intimate Partner Violence:   . Fear of Current or Ex-Partner:   . Emotionally Abused:   Marland Kitchen Physically Abused:   . Sexually Abused:     Observations/Objective: There were no vitals filed for this visit. Appears fatigued but not toxic, appropriate response over video, no respiratory distress.  Understanding of plan  expressed with all questions answered.  Assessment and Plan: Type 2 diabetes mellitus with hyperglycemia, without long-term current use of insulin (HCC)  Nausea and vomiting, intractability of vomiting not specified, unspecified vomiting type  Hyperglycemia  Increased thirst  Fatigue, unspecified type  Loss of weight  2-week history of fatigue, weight loss, increased thirst, increasing hyperglycemia, now too high to read with nausea and vomiting past 2 days, suspicious for diabetic ketoacidosis.  ER evaluation today planned.  Her husband will drive her.  Understanding plan expressed.  Follow Up  Instructions: To be determined after ER visit.   I discussed the assessment and treatment plan with the patient. The patient was provided an opportunity to ask questions and all were answered. The patient agreed with the plan and demonstrated an understanding of the instructions.   The patient was advised to call back or seek an in-person evaluation if the symptoms worsen or if the condition fails to improve as anticipated.  I provided 14 minutes of non-face-to-face time during this encounter.   Wendie Agreste, MD

## 2019-12-15 NOTE — Patient Instructions (Signed)
° ° ° °  If you have lab work done today you will be contacted with your lab results within the next 2 weeks.  If you have not heard from us then please contact us. The fastest way to get your results is to register for My Chart. ° ° °IF you received an x-ray today, you will receive an invoice from [facility]. Please contact [facility] at [phone] with questions or concerns regarding your invoice.  ° °IF you received labwork today, you will receive an invoice from [facility]. Please contact [facility] at [phone] with questions or concerns regarding your invoice.  ° °Our billing staff will not be able to assist you with questions regarding bills from these companies. ° °You will be contacted with the lab results as soon as they are available. The fastest way to get your results is to activate your My Chart account. Instructions are located on the last page of this paperwork. If you have not heard from us regarding the results in 2 weeks, please contact this office. °  ° ° ° °

## 2019-12-15 NOTE — ED Provider Notes (Signed)
[facility] DEPT Provider Note   CSN: [id number] Arrival date & time: 12/15/19  1731     History Chief Complaint  Patient presents with  . Hyperglycemia    Beverly Ramirez is a 59 y.o. female.  HPI Patient reports that she has a history of ulcerative colitis.  When she takes steroids it makes her blood sugars elevate.  She reports her blood sugars will sometimes get up in the 200s.  She has been taking Metformin to control blood sugars.  She reports however she has not been taking Metformin for several weeks because the blood sugars were staying consistently in the 80s.  Now however after the past couple weeks she is getting increasing fatigue and thirst.  She reports over the past several days she just cannot get enough water to drink.  She is urinating frequently.  She is also had weight loss over the past 2 weeks.  She denies her vision is blurred but she does feel like her eyes are very "fatigued".  No headache.  No fevers.  Patient does not currently have an flare of her ulcerative colitis.  She reports when she gets a flare she gets blisters in her mouth and bloody diarrhea.    Past Medical History:  Diagnosis Date  . Anemia   . Carpal tunnel syndrome    bilateral  . Chronic heel pain   . Diabetes mellitus without complication ([place])   . DJD (degenerative joint disease) of cervical spine    MRI 2017  . Hyperlipidemia   . Plantar fasciitis    bilateral  . Sickle cell anemia (HCC)    sickle cell trait  . Sleep apnea   . Tendonitis    left hand/wrist  . UC (ulcerative colitis) [facility])     Patient Active Problem List   Diagnosis Date Noted  . De Quervain's tenosynovitis, left 11/16/2018  . Bilateral carpal tunnel syndrome 04/13/2018  . Leukocytosis 10/04/2017  . Hyperlipidemia 08/04/2017  . Ulcerative colitis without complications ([place]) 56/31/4970  . Plantar fasciitis 05/28/2017    Past Surgical History:  Procedure Laterality Date  .  ABDOMINAL HYSTERECTOMY    . APPENDECTOMY    . CESAREAN SECTION     x4  . COLONOSCOPY     Multiple in [place]  . ESOPHAGOGASTRODUODENOSCOPY       OB History    Gravida  4   Para  4   Term      Preterm      AB      Living  4     SAB      TAB      Ectopic      Multiple      Live Births              Family History  Problem Relation Age of Onset  . Stroke Mother   . Diabetes Mother   . Hypertension Mother   . Lung cancer Mother   . Sickle cell trait Mother   . Breast cancer Maternal Aunt   . Diabetes Maternal Aunt   . Lung cancer Maternal Aunt   . Lung cancer Maternal Aunt   . Lung cancer Maternal Aunt   . Colon cancer Cousin 7  . Sickle cell trait Father   . Sickle cell anemia Sister 51  . Lung cancer Maternal Grandmother   . Sickle cell anemia Sister   . Other Sister        accident and blood  clot formed    Social History   Tobacco Use  . Smoking status: Former Smoker    Types: Cigarettes    Quit date: 10/13/2006    Years since quitting: 13.1  . Smokeless tobacco: Never Used  Substance Use Topics  . Alcohol use: Yes    Comment: occasional  . Drug use: No    Home Medications Prior to Admission medications   Medication Sig Start Date End Date Taking? Authorizing Provider  atorvastatin (LIPITOR) 20 MG tablet Take 1 tablet (20 mg total) by mouth daily. 07/28/19   Rutherford Guys, MD  cyclobenzaprine (FLEXERIL) 10 MG tablet Take 1 tablet (10 mg total) by mouth 3 (three) times daily as needed for muscle spasms. 09/26/19   Rutherford Guys, MD  DULoxetine (CYMBALTA) 60 MG capsule Take 1 capsule (60 mg total) by mouth daily. 09/26/19   Rutherford Guys, MD  fluticasone [facility]) 50 MCG/ACT nasal spray SPRAY 2 SPRAYS INTO EACH NOSTRIL EVERY DAY 12/14/19   Rutherford Guys, MD  Insulin Glargine (LANTUS SOLOSTAR) 100 UNIT/ML Solostar Pen Inject 20 Units into the skin at bedtime. 11/11/18   LampteyMyrene Galas, MD  Insulin Syringe-Needle U-100 (INSULIN  SYRINGE 1CC/31GX5/16") 31G X 5/16" 1 ML MISC Inject 0.08 ( 8 Units)  into the skin 3 times daily before meals. 10/16/18   Rutherford Guys, MD  Lancets [facility] DELICA PLUS [id number]) MISC 1 each by Does not apply route 3 (three) times daily. Dx E11.65, z79.4 01/24/19   Rutherford Guys, MD  mesalamine (LIALDA) 1.2 g EC tablet Take 2 tablets (2.4 g total) by mouth 2 (two) times daily. 11/25/18 11/20/19  Gatha Mayer, MD  metFORMIN (GLUCOPHAGE) 500 MG tablet TAKE 1 TABLET BY MOUTH TWICE DAILY FOR TWO WEEKS. THEN TAKE 2 TABLETS BY MOUTH TWICE DAILY. 02/22/19   Rutherford Guys, MD    Allergies    Codeine and Latex  Review of Systems   Review of Systems 10 Systems reviewed and are negative for acute change except as noted in the HPI. Physical Exam Updated Vital Signs BP (!) 137/92   Pulse 79   Temp 98 F (36.7 C)   Resp 17   Ht 5' 2"  (1.575 m)   Wt 71.7 kg   SpO2 99%   BMI 28.90 kg/m   Physical Exam Constitutional:      Comments: Patient is alert and appropriate.  She is sitting on the edge of stretcher.  Mental status is clear.  No respiratory distress.  HENT:     Head: Normocephalic and atraumatic.     Mouth/Throat:     Mouth: Mucous membranes are moist.     Pharynx: Oropharynx is clear.     Comments: No oral lesions at this time. Eyes:     Extraocular Movements: Extraocular movements intact.     Conjunctiva/sclera: Conjunctivae normal.     Pupils: Pupils are equal, round, and reactive to light.  Cardiovascular:     Rate and Rhythm: Normal rate and regular rhythm.  Pulmonary:     Effort: Pulmonary effort is normal.     Breath sounds: Normal breath sounds.  Abdominal:     General: There is no distension.     Palpations: Abdomen is soft.     Tenderness: There is no abdominal tenderness. There is no guarding.  Musculoskeletal:        General: No swelling or tenderness. Normal range of motion.     Right lower leg: No edema.  Left lower leg: No edema.  Skin:    General:  Skin is warm and dry.  Neurological:     General: No focal deficit present.     Mental Status: She is oriented to person, place, and time.     Cranial Nerves: No cranial nerve deficit.     Coordination: Coordination normal.     Gait: Gait normal.  Psychiatric:        Mood and Affect: Mood normal.     ED Results / Procedures / Treatments   Labs (all labs ordered are listed, but only abnormal results are displayed) Labs Reviewed  BASIC METABOLIC PANEL - Abnormal; Notable for the following components:      Result Value   Sodium 128 (*)    Chloride 93 (*)    Glucose, Bld 867 (*)    Creatinine, Ser 1.01 (*)    All other components within normal limits  CBC - Abnormal; Notable for the following components:   WBC 12.0 (*)    All other components within normal limits  CBG MONITORING, ED - Abnormal; Notable for the following components:   Glucose-Capillary >600 (*)    All other components within normal limits  URINALYSIS, ROUTINE W REFLEX MICROSCOPIC  CBG MONITORING, ED    EKG None  Radiology No results found.  Procedures Procedures (including critical care time)  Medications Ordered in ED Medications - No data to display  ED Course  I have reviewed the triage vital signs and the nursing notes.  Pertinent labs & imaging results that were available during my care of the patient were reviewed by me and considered in my medical decision making (see chart for details).  Clinical Course as of Dec 14 2328  Thu Dec 15, 2019  2122 Patient has had 1 L of fluids and 10 units of subcu insulin.  Nursing reports that the IV had come out and she is now getting the second liter.  Will have them go ahead and check the CBG at this time.  Patient reports she feels fatigued but otherwise no pain and no nausea or vomiting.   [MP]  2214 Patient is alert and appropriate.  She reports she is having cramps in her feet and lower legs.  She reports this is a problem that she suffers from  periodically.  She does not have any specific medications she uses at home to treat this.  She reports it is very painful when it occurs and she usually has to wait it out.  We will recheck basic chemistry for potassium level.  Will give Valium 2 mg p.o. for muscle spasms of the legs and feet.   [MP]    Clinical Course User Index [MP] Charlesetta Shanks, MD   MDM Rules/Calculators/A&P                      Patient has been off of any insulin or oral diabetic agents for several months.  She had previously been on insulin and then was on Metformin only.  She however has not been taking the Metformin either.  Blood sugars have elevated significantly.  Mental status however is clear.  Patient is nontoxic.  She has been rehydrated and given subcu insulin with improvement to low 400s.  She did develop significant cramping in her feet and legs.  Will recheck basic chemistry for any significant hypokalemia and assess for correction of pseudohyponatremia.  If potassium remains in normal limits and blood sugar remains less than  low 400s, anticipate patient will be stable for discharge and resume home regular insulin and nighttime Lantus.  Patient be counseled for checking blood sugars 3 times daily and close follow-up with PCP. Final Clinical Impression(s) / ED Diagnoses Final diagnoses:  Hyperglycemia  Type 2 diabetes mellitus without complication, unspecified whether long term insulin use (HCC)  Foot cramps    Rx / DC Orders ED Discharge Orders    None       Charlesetta Shanks, MD 12/15/19 2337

## 2019-12-15 NOTE — Discharge Instructions (Addendum)
1.  Resume your Lantus tomorrow evening.  Start your NovoLog tomorrow morning per your previous sliding scale dosing.  Check your blood sugars at least 3 times a day.  Call your doctor and schedule a recheck this week. 2.  Return to the emergency department if you have any worsening or changing symptoms.

## 2019-12-16 ENCOUNTER — Other Ambulatory Visit: Payer: Self-pay | Admitting: Family Medicine

## 2019-12-16 ENCOUNTER — Telehealth: Payer: Self-pay | Admitting: Family Medicine

## 2019-12-16 MED ORDER — ONETOUCH VERIO VI STRP: ORAL_STRIP | 12 refills | Status: DC

## 2019-12-16 NOTE — Telephone Encounter (Signed)
Spoke to patient confirmed test strip type and Dr. Carlota Raspberry stated the insulin refill was appropriate.

## 2019-12-16 NOTE — Telephone Encounter (Signed)
Requested medication (s) are due for refill today:   Not sure  Requested medication (s) are on the active medication list:   Yes  Future visit scheduled:   Yes in 1 month with Dr. Pamella Pert   Last ordered: 11/11/2018  5 pens  0 refills  Clinic note:   She saw Dr. Carlota Raspberry yesterday and was referred to the ED due to elevated glucose.  This insulin was last prescribed by a doctor at the [facility] on 11/11/2018.    I don't see where this was reordered by Dr. Carlota Raspberry or in the ED visit yesterday.   Requested Prescriptions  Pending Prescriptions Disp Refills   Insulin Glargine (BASAGLAR KWIKPEN) 100 UNIT/ML [Pharmacy Med Name: BASAGLAR 100 UNIT/ML KWIKPEN]      Sig: INJECT 20 UNITS INTO THE SKIN AT BEDTIME.      Endocrinology:  Diabetes - Insulins Passed - 12/16/2019  9:28 AM      Passed - HBA1C is between 0 and 7.9 and within 180 days    Hgb A1c MFr Bld  Date Value Ref Range Status  07/25/2019 7.1 (H) 4.8 - 5.6 % Final    Comment:             Prediabetes: 5.7 - 6.4          Diabetes: >6.4          Glycemic control for adults with diabetes: <7.0           Passed - Valid encounter within last 6 months    Recent Outpatient Visits           Yesterday Type 2 diabetes mellitus with hyperglycemia, without long-term current use of insulin [facility])   [facility], Ranell Patrick, MD   2 months ago Fibromyalgia   [facility], Lilia Argue, MD   4 months ago New persistent daily headache   [facility], Lilia Argue, MD   4 months ago Fever, unspecified   [facility], Lilia Argue, MD   4 months ago Worst headache of life   [facility], Ranell Patrick, MD       Future Appointments             In 1 month Pamella Pert, Lilia Argue, MD [facility] at [place]

## 2019-12-16 NOTE — Telephone Encounter (Signed)
Pt called and had an mychart visit with Dr. Carlota Raspberry on 4/8 pt states Dr. Carlota Raspberry sent her to ED because surgar was too hight. Pt just got out of ED this morning 4/9 and stated she had insulin at home but when she got home the proscription box for her insulin says expires on April of 2021 but the bottles say they expire on October of 2021. Pt states her sugar is starting to go back up and would like a nurse to call her to see what she should do.   Pt also states she need a refill on her   Insulin Glargine (LANTUS SOLOSTAR) 100 UNIT/ML Solostar Pen [[id number]]   Please advise. [phone]

## 2019-12-19 ENCOUNTER — Telehealth: Payer: Self-pay | Admitting: Family Medicine

## 2019-12-19 ENCOUNTER — Encounter (HOSPITAL_COMMUNITY): Payer: Self-pay | Admitting: Emergency Medicine

## 2019-12-19 ENCOUNTER — Other Ambulatory Visit: Payer: Self-pay

## 2019-12-19 ENCOUNTER — Ambulatory Visit (INDEPENDENT_AMBULATORY_CARE_PROVIDER_SITE_OTHER)
Admission: EM | Admit: 2019-12-19 | Discharge: 2019-12-19 | Disposition: A | Discharge status: Other Acute Inpatient Hospital | Payer: Medicare Other | Source: Home / Self Care

## 2019-12-19 ENCOUNTER — Emergency Department (HOSPITAL_COMMUNITY)
Admission: EM | Admit: 2019-12-19 | Discharge: 2019-12-20 | Disposition: A | Discharge status: Home / Self Care | Payer: Medicare Other | Attending: Emergency Medicine | Admitting: Emergency Medicine

## 2019-12-19 ENCOUNTER — Telehealth: Payer: Self-pay

## 2019-12-19 DIAGNOSIS — R531 Weakness: Secondary | ICD-10-CM

## 2019-12-19 DIAGNOSIS — Z794 Long term (current) use of insulin: Secondary | ICD-10-CM | POA: Diagnosis not present

## 2019-12-19 DIAGNOSIS — Z5321 Procedure and treatment not carried out due to patient leaving prior to being seen by health care provider: Secondary | ICD-10-CM | POA: Insufficient documentation

## 2019-12-19 DIAGNOSIS — R5383 Other fatigue: Secondary | ICD-10-CM | POA: Diagnosis not present

## 2019-12-19 DIAGNOSIS — E1165 Type 2 diabetes mellitus with hyperglycemia: Secondary | ICD-10-CM | POA: Insufficient documentation

## 2019-12-19 DIAGNOSIS — R739 Hyperglycemia, unspecified: Secondary | ICD-10-CM | POA: Diagnosis not present

## 2019-12-19 DIAGNOSIS — R81 Glycosuria: Secondary | ICD-10-CM | POA: Diagnosis not present

## 2019-12-19 LAB — URINALYSIS, ROUTINE W REFLEX MICROSCOPIC
Bacteria, UA: NONE SEEN
Bilirubin Urine: NEGATIVE
Glucose, UA: >=500 mg/dL — AB
Ketones, ur: NEGATIVE mg/dL
Leukocytes,Ua: NEGATIVE
Nitrite: NEGATIVE
Protein, ur: NEGATIVE mg/dL
Specific Gravity, Urine: 1.017 (ref 1.005–1.030)
pH: 5 (ref 5.0–8.0)

## 2019-12-19 LAB — POCT URINALYSIS DIP (DEVICE)
Bilirubin Urine: NEGATIVE
Glucose, UA: 500 mg/dL — AB
Ketones, ur: NEGATIVE mg/dL
Leukocytes,Ua: NEGATIVE
Nitrite: NEGATIVE
Protein, ur: NEGATIVE mg/dL
Specific Gravity, Urine: 1.01 (ref 1.005–1.030)
Urobilinogen, UA: 0.2 mg/dL (ref 0.0–1.0)
pH: 5 (ref 5.0–8.0)

## 2019-12-19 LAB — I-STAT BETA HCG BLOOD, ED (MC, WL, AP ONLY): I-stat hCG, quantitative: 8.3 m[IU]/mL — ABNORMAL HIGH (ref <5)

## 2019-12-19 LAB — CBC
HCT: 35.3 % — ABNORMAL LOW (ref 36.0–46.0)
Hemoglobin: 11.6 g/dL — ABNORMAL LOW (ref 12.0–15.0)
MCH: 27.8 pg (ref 26.0–34.0)
MCHC: 32.9 g/dL (ref 30.0–36.0)
MCV: 84.7 fL (ref 80.0–100.0)
Platelets: 322 10*3/uL (ref 150–400)
RBC: 4.17 MIL/uL (ref 3.87–5.11)
RDW: 12.4 % (ref 11.5–15.5)
WBC: 11.6 10*3/uL — ABNORMAL HIGH (ref 4.0–10.5)
nRBC: 0 % (ref 0.0–0.2)

## 2019-12-19 LAB — BASIC METABOLIC PANEL
Anion gap: 9 (ref 5–15)
BUN: 12 mg/dL (ref 6–20)
CO2: 24 mmol/L (ref 22–32)
Calcium: 9.2 mg/dL (ref 8.9–10.3)
Chloride: 102 mmol/L (ref 98–111)
Creatinine, Ser: 0.81 mg/dL (ref 0.44–1.00)
GFR calc Af Amer: >60 mL/min (ref >60)
GFR calc non Af Amer: >60 mL/min (ref >60)
Glucose, Bld: 445 mg/dL — ABNORMAL HIGH (ref 70–99)
Potassium: 4.6 mmol/L (ref 3.5–5.1)
Sodium: 135 mmol/L (ref 135–145)

## 2019-12-19 LAB — GLUCOSE, CAPILLARY: Glucose-Capillary: 452 mg/dL — ABNORMAL HIGH (ref 70–99)

## 2019-12-19 LAB — CBG MONITORING, ED
Glucose-Capillary: 408 mg/dL — ABNORMAL HIGH (ref 70–99)
Glucose-Capillary: 452 mg/dL — ABNORMAL HIGH (ref 70–99)
Glucose-Capillary: 458 mg/dL — ABNORMAL HIGH (ref 70–99)

## 2019-12-19 NOTE — ED Triage Notes (Signed)
Pt admitted to hospital on Friday and Saturday of last week for hyperglycemia. Has not been able to get glucose under 309 since being discharged. Pt c/o fatigue. Last insulin dose at 1600.

## 2019-12-19 NOTE — Telephone Encounter (Signed)
Called pt back and told her to go to the UC she requested I ask Dr Pamella Pert if she could just increase her insuline. Sent message to [place] will call pt back later

## 2019-12-19 NOTE — Discharge Instructions (Signed)
Go to the ER for further evaluation and treatment of her hyperglycemia.

## 2019-12-19 NOTE — Telephone Encounter (Signed)
Pt did not answer call advised her to call back, advise UC for uncontrolled BG level

## 2019-12-19 NOTE — Telephone Encounter (Signed)
Pt asking if she cant just increase her insulin to bring down her sugars rather than going to UC especially after already going to the ED

## 2019-12-19 NOTE — ED Triage Notes (Signed)
Pt in POV, reports sugars have been running high. Recently DC from hospital past Saturday for hyperglycemia. CBG 408 in triage.

## 2019-12-19 NOTE — Telephone Encounter (Signed)
Pt called

## 2019-12-19 NOTE — ED Provider Notes (Signed)
[place]    CSN: [id number] Arrival date & time: 12/19/19  1813      History   Chief Complaint Chief Complaint  Patient presents with  . Hyperglycemia  . Fatigue    HPI Beverly Ramirez is a 59 y.o. female.   Patient reports that she was recently discharged from the hospital 2 days ago.  For being in DKA.  She reports that she cannot get a control of her blood sugar.  She reports that she tried to get in with her primary care today, and they told her to report here for further evaluation.  Patient reports that she is very fatigued, feels "out of it" and that her blood sugar was 500 this morning.  She states that she has been taking her insulin as prescribed.  She feels that she does not have enough to cover her blood sugars.  ROS per HPI  The history is provided by the patient.    Past Medical History:  Diagnosis Date  . Anemia   . Carpal tunnel syndrome    bilateral  . Chronic heel pain   . Diabetes mellitus without complication ([place])   . DJD (degenerative joint disease) of cervical spine    MRI 2017  . Hyperlipidemia   . Plantar fasciitis    bilateral  . Sickle cell anemia (HCC)    sickle cell trait  . Sleep apnea   . Tendonitis    left hand/wrist  . UC (ulcerative colitis) [facility])     Patient Active Problem List   Diagnosis Date Noted  . De Quervain's tenosynovitis, left 11/16/2018  . Bilateral carpal tunnel syndrome 04/13/2018  . Leukocytosis 10/04/2017  . Hyperlipidemia 08/04/2017  . Ulcerative colitis without complications ([place]) 17/91/5056  . Plantar fasciitis 05/28/2017    Past Surgical History:  Procedure Laterality Date  . ABDOMINAL HYSTERECTOMY    . APPENDECTOMY    . CESAREAN SECTION     x4  . COLONOSCOPY     Multiple in [place]  . ESOPHAGOGASTRODUODENOSCOPY      OB History    Gravida  4   Para  4   Term      Preterm      AB      Living  4     SAB      TAB      Ectopic      Multiple      Live Births                 Home Medications    Prior to Admission medications   Medication Sig Start Date End Date Taking? Authorizing Provider  acetaminophen (TYLENOL) 500 MG tablet Take 500 mg by mouth every 6 (six) hours as needed for mild pain or headache.    [provider]  amLODipine (NORVASC) 5 MG tablet Take 5 mg by mouth at bedtime.  12/15/19   [provider]  atorvastatin (LIPITOR) 20 MG tablet Take 1 tablet (20 mg total) by mouth daily. 07/28/19   Rutherford Guys, MD  cyclobenzaprine (FLEXERIL) 10 MG tablet Take 1 tablet (10 mg total) by mouth 3 (three) times daily as needed for muscle spasms. 09/26/19   Rutherford Guys, MD  DULoxetine (CYMBALTA) 60 MG capsule Take 1 capsule (60 mg total) by mouth daily. 09/26/19   Rutherford Guys, MD  fluticasone (FLONASE) 50 MCG/ACT nasal spray SPRAY 2 SPRAYS INTO EACH NOSTRIL EVERY DAY Patient taking differently: Place 2 sprays into  both nostrils daily.  12/14/19   Rutherford Guys, MD  glucose blood [facility] VERIO) test strip Use as instructed 12/16/19   Rutherford Guys, MD  Insulin Glargine [facility]) 100 UNIT/ML INJECT 20 UNITS INTO THE SKIN AT BEDTIME. 12/16/19   Rutherford Guys, MD  Insulin Syringe-Needle U-100 (INSULIN SYRINGE 1CC/31GX5/16") 31G X 5/16" 1 ML MISC Inject 0.08 ( 8 Units)  into the skin 3 times daily before meals. 10/16/18   Rutherford Guys, MD  Lancets [facility] DELICA PLUS [id number]) MISC 1 each by Does not apply route 3 (three) times daily. Dx E11.65, z79.4 01/24/19   Rutherford Guys, MD  mesalamine (LIALDA) 1.2 g EC tablet Take 2 tablets (2.4 g total) by mouth 2 (two) times daily. Patient taking differently: Take 4.8 g by mouth daily with breakfast.  11/25/18 12/15/19  Gatha Mayer, MD  metFORMIN (GLUCOPHAGE) 500 MG tablet TAKE 1 TABLET BY MOUTH TWICE DAILY FOR TWO WEEKS. THEN TAKE 2 TABLETS BY MOUTH TWICE DAILY. Patient taking differently: Take 500 mg by mouth daily with breakfast.  02/22/19   Rutherford Guys, MD  NONFORMULARY OR COMPOUNDED ITEM Swish and spit 5 mLs as needed (ulcer flare up in mouth). Magic mouthwash with lidocaine    [provider]    Family History Family History  Problem Relation Age of Onset  . Stroke Mother   . Diabetes Mother   . Hypertension Mother   . Lung cancer Mother   . Sickle cell trait Mother   . Breast cancer Maternal Aunt   . Diabetes Maternal Aunt   . Lung cancer Maternal Aunt   . Lung cancer Maternal Aunt   . Lung cancer Maternal Aunt   . Colon cancer Cousin 64  . Sickle cell trait Father   . Sickle cell anemia Sister 59  . Lung cancer Maternal Grandmother   . Sickle cell anemia Sister   . Other Sister        accident and blood clot formed    Social History Social History   Tobacco Use  . Smoking status: Former Smoker    Types: Cigarettes    Quit date: 10/13/2006    Years since quitting: 13.1  . Smokeless tobacco: Never Used  Substance Use Topics  . Alcohol use: Yes    Comment: occasional  . Drug use: No     Allergies   Codeine and Latex   Review of Systems Review of Systems   Physical Exam Triage Vital Signs ED Triage Vitals  Enc Vitals Group     BP 12/19/19 1840 (!) 150/83     Pulse Rate 12/19/19 1840 75     Resp 12/19/19 1840 16     Temp 12/19/19 1840 98 F (36.7 C)     Temp src --      SpO2 12/19/19 1840 97 %     Weight --      Height --      Head Circumference --      Peak Flow --      Pain Score 12/19/19 1841 0     Pain Loc --      Pain Edu? --      Excl. in [place]? --    No data found.  Updated Vital Signs BP (!) 150/83   Pulse 75   Temp 98 F (36.7 C)   Resp 16   SpO2 97%   Visual Acuity Right Eye Distance:   Left Eye Distance:  Bilateral Distance:    Right Eye Near:   Left Eye Near:    Bilateral Near:     Physical Exam Vitals and nursing note reviewed.  Constitutional:      General: She is in acute distress (Very slow moving, very fatigued, hyperglycemic).     Appearance: She is  well-developed. She is ill-appearing.  HENT:     Head: Normocephalic and atraumatic.     Nose: Nose normal.  Eyes:     Conjunctiva/sclera: Conjunctivae normal.  Cardiovascular:     Rate and Rhythm: Normal rate and regular rhythm.     Heart sounds: Normal heart sounds. No murmur.  Pulmonary:     Effort: Pulmonary effort is normal. No respiratory distress.     Breath sounds: Normal breath sounds. No stridor. No wheezing, rhonchi or rales.  Chest:     Chest wall: No tenderness.  Abdominal:     General: Bowel sounds are normal.     Palpations: Abdomen is soft.     Tenderness: There is no abdominal tenderness.  Musculoskeletal:        General: Normal range of motion.     Cervical back: Normal range of motion and neck supple.  Skin:    General: Skin is warm and dry.     Capillary Refill: Capillary refill takes less than 2 seconds.  Neurological:     General: No focal deficit present.     Mental Status: She is alert and oriented to person, place, and time.  Psychiatric:        Mood and Affect: Mood normal.        Behavior: Behavior normal.        Thought Content: Thought content normal.      UC Treatments / Results  Labs (all labs ordered are listed, but only abnormal results are displayed) Labs Reviewed  GLUCOSE, CAPILLARY - Abnormal; Notable for the following components:      Result Value   Glucose-Capillary 452 (*)    All other components within normal limits  CBG MONITORING, ED - Abnormal; Notable for the following components:   Glucose-Capillary 452 (*)    All other components within normal limits  POCT URINALYSIS DIP (DEVICE) - Abnormal; Notable for the following components:   Glucose, UA 500 (*)    Hgb urine dipstick TRACE (*)    All other components within normal limits    EKG   Radiology No results found.  Procedures Procedures (including critical care time)  Medications Ordered in UC Medications - No data to display  Initial Impression / Assessment  and Plan / UC Course  I have reviewed the triage vital signs and the nursing notes.  Pertinent labs & imaging results that were available during my care of the patient were reviewed by me and considered in my medical decision making (see chart for details).     Hyperglycemia: Presents for hyperglycemia for the last about 2 weeks.  Recently discharged from the hospital.  Blood sugar in office today is 452, also spilling sugar into her urine greater than 500.  On exam, patient appears very ill.  Discussed case with Dr. Meda Coffee in the office, she agrees that patient should follow-up in the ER.  Discussed with patient that she is likely in DKA again, and that I would have her go to the ER to follow-up for further evaluation and treatment.  Patient verbalizes understanding and agrees with treatment plan. Final Clinical Impressions(s) / UC Diagnoses   Final diagnoses:  Other fatigue  Hyperglycemia  Weakness  Glycosuria     Discharge Instructions     Go to the ER for further evaluation and treatment of her hyperglycemia.    ED Prescriptions    None     PDMP not reviewed this encounter.   Faustino Congress, NP 12/21/19 [phone]

## 2019-12-19 NOTE — Telephone Encounter (Signed)
Pt called in again stating she had a mychart visit with Dr. Carlota Raspberry on 4/8 and was directed to go to ED. Pt states after she came home her sugar has been high since. This morning she checked it at 8:30 am and her sugar was 320. Advised pt that we don't have any in office visits that she might need to go to Urgent Care. Pt is wanting to know if she needs her insulin medication dose to be upped. Pt would like a nurse to give her a call on what she should do. Please advise. [phone]

## 2019-12-20 NOTE — Telephone Encounter (Signed)
Cbgs are running high for past week Before lunch 448 today Currently 388, had lunch 40 minutes ago Lantus 20 units, fasting today 288 Novolin R 10 units AC Has been using insulin for past week Has not been taking metformin  Instructed:  Restart metformin BID AC breakfast and dinner Increase lantus by 2 units every 3 days, fasting goal 130 Keep appt tomorrow with Felton Clinton, NP: BMP and UA See me in 1 week  Last steroids end of Jan 2021 for UC flare up

## 2019-12-20 NOTE — Telephone Encounter (Signed)
Pt returned call and asked for nurse to call her about her sugar still being off. She stated it was at a 4 /please advise asap

## 2019-12-20 NOTE — Telephone Encounter (Signed)
ER note and labs reviewed.  Called patient, no answer, left VM I am OOTO today. She has appt with colleague for tomorrow.

## 2019-12-20 NOTE — ED Notes (Signed)
Pt told this ED tech that she has been monitoring her own BG and gave herself insulin. She stated she was going to leave when her husband got here, pt was encouraged to stay. Herbert Spires, RN notified. Pt just seen leaving.

## 2019-12-21 ENCOUNTER — Ambulatory Visit: Payer: Medicare Other | Admitting: Adult Health Nurse Practitioner

## 2019-12-23 ENCOUNTER — Other Ambulatory Visit: Payer: Self-pay

## 2019-12-23 ENCOUNTER — Emergency Department (HOSPITAL_COMMUNITY)
Admission: EM | Admit: 2019-12-23 | Discharge: 2019-12-23 | Disposition: A | Discharge status: Home / Self Care | Payer: Medicare Other | Attending: Emergency Medicine | Admitting: Emergency Medicine

## 2019-12-23 ENCOUNTER — Emergency Department (HOSPITAL_COMMUNITY): Payer: Medicare Other

## 2019-12-23 ENCOUNTER — Encounter: Payer: Self-pay | Admitting: Adult Health Nurse Practitioner

## 2019-12-23 ENCOUNTER — Ambulatory Visit (INDEPENDENT_AMBULATORY_CARE_PROVIDER_SITE_OTHER): Payer: Medicare Other | Admitting: Adult Health Nurse Practitioner

## 2019-12-23 ENCOUNTER — Encounter (HOSPITAL_COMMUNITY): Payer: Self-pay

## 2019-12-23 VITALS — BP 137/86 | HR 77 | Temp 97.6°F | Ht 62.0 in | Wt 153.4 lb

## 2019-12-23 DIAGNOSIS — R7309 Other abnormal glucose: Secondary | ICD-10-CM

## 2019-12-23 DIAGNOSIS — R519 Headache, unspecified: Secondary | ICD-10-CM | POA: Insufficient documentation

## 2019-12-23 DIAGNOSIS — R42 Dizziness and giddiness: Secondary | ICD-10-CM | POA: Diagnosis not present

## 2019-12-23 DIAGNOSIS — E1110 Type 2 diabetes mellitus with ketoacidosis without coma: Secondary | ICD-10-CM | POA: Insufficient documentation

## 2019-12-23 DIAGNOSIS — E1165 Type 2 diabetes mellitus with hyperglycemia: Secondary | ICD-10-CM | POA: Insufficient documentation

## 2019-12-23 DIAGNOSIS — Z794 Long term (current) use of insulin: Secondary | ICD-10-CM | POA: Diagnosis not present

## 2019-12-23 DIAGNOSIS — R112 Nausea with vomiting, unspecified: Secondary | ICD-10-CM | POA: Insufficient documentation

## 2019-12-23 DIAGNOSIS — R739 Hyperglycemia, unspecified: Secondary | ICD-10-CM

## 2019-12-23 LAB — BLOOD GAS, VENOUS
Acid-base deficit: 0.2 mmol/L (ref 0.0–2.0)
Bicarbonate: 25 mmol/L (ref 20.0–28.0)
O2 Saturation: 60.6 %
Patient temperature: 98.6
pCO2, Ven: 45.6 mmHg (ref 44.0–60.0)
pH, Ven: 7.358 (ref 7.250–7.430)
pO2, Ven: 36.4 mmHg (ref 32.0–45.0)

## 2019-12-23 LAB — GLUCOSE, POCT (MANUAL RESULT ENTRY): POC Glucose: 396 mg/dl — AB (ref 70–99)

## 2019-12-23 LAB — URINALYSIS, ROUTINE W REFLEX MICROSCOPIC
Bilirubin Urine: NEGATIVE
Glucose, UA: >=500 mg/dL — AB
Hgb urine dipstick: NEGATIVE
Ketones, ur: NEGATIVE mg/dL
Leukocytes,Ua: NEGATIVE
Nitrite: NEGATIVE
Protein, ur: NEGATIVE mg/dL
Specific Gravity, Urine: 1.012 (ref 1.005–1.030)
pH: 6 (ref 5.0–8.0)

## 2019-12-23 LAB — POCT URINALYSIS DIP (MANUAL ENTRY)
Bilirubin, UA: NEGATIVE
Glucose, UA: >=1000 mg/dL — AB
Ketones, POC UA: NEGATIVE mg/dL
Leukocytes, UA: NEGATIVE
Nitrite, UA: NEGATIVE
Protein Ur, POC: NEGATIVE mg/dL
Spec Grav, UA: 1.01 (ref 1.010–1.025)
Urobilinogen, UA: 0.2 E.U./dL
pH, UA: 5 (ref 5.0–8.0)

## 2019-12-23 LAB — BASIC METABOLIC PANEL
Anion gap: 8 (ref 5–15)
BUN: 11 mg/dL (ref 6–20)
CO2: 26 mmol/L (ref 22–32)
Calcium: 9.3 mg/dL (ref 8.9–10.3)
Chloride: 101 mmol/L (ref 98–111)
Creatinine, Ser: 0.8 mg/dL (ref 0.44–1.00)
GFR calc Af Amer: >60 mL/min (ref >60)
GFR calc non Af Amer: >60 mL/min (ref >60)
Glucose, Bld: 380 mg/dL — ABNORMAL HIGH (ref 70–99)
Potassium: 4 mmol/L (ref 3.5–5.1)
Sodium: 135 mmol/L (ref 135–145)

## 2019-12-23 LAB — CBC
HCT: 38.9 % (ref 36.0–46.0)
Hemoglobin: 13 g/dL (ref 12.0–15.0)
MCH: 28.6 pg (ref 26.0–34.0)
MCHC: 33.4 g/dL (ref 30.0–36.0)
MCV: 85.7 fL (ref 80.0–100.0)
Platelets: 344 10*3/uL (ref 150–400)
RBC: 4.54 MIL/uL (ref 3.87–5.11)
RDW: 12.5 % (ref 11.5–15.5)
WBC: 10.6 10*3/uL — ABNORMAL HIGH (ref 4.0–10.5)
nRBC: 0 % (ref 0.0–0.2)

## 2019-12-23 LAB — HEPATIC FUNCTION PANEL
ALT: 62 U/L — ABNORMAL HIGH (ref 0–44)
AST: 37 U/L (ref 15–41)
Albumin: 3.6 g/dL (ref 3.5–5.0)
Alkaline Phosphatase: 155 U/L — ABNORMAL HIGH (ref 38–126)
Bilirubin, Direct: 0.1 mg/dL (ref 0.0–0.2)
Indirect Bilirubin: 0.5 mg/dL (ref 0.3–0.9)
Total Bilirubin: 0.6 mg/dL (ref 0.3–1.2)
Total Protein: 7.4 g/dL (ref 6.5–8.1)

## 2019-12-23 LAB — CBG MONITORING, ED
Glucose-Capillary: 345 mg/dL — ABNORMAL HIGH (ref 70–99)
Glucose-Capillary: 215 mg/dL — ABNORMAL HIGH (ref 70–99)
Glucose-Capillary: 166 mg/dL — ABNORMAL HIGH (ref 70–99)

## 2019-12-23 IMAGING — CT CT HEAD W/O CM
3 series · 16 of 47 positions shown, 19 images · non-contrast
Comparison: Head CT [DATE]. Brain MRI [DATE].

CLINICAL DATA: 58-year-old female with headache dizziness and
nausea for 3 days.

EXAM:
CT HEAD WITHOUT CONTRAST
TECHNIQUE: Contiguous axial images were obtained from the base of the skull
through the vertex without intravenous contrast.

[Series 2: head wo · axial · 0.47mm/px · z∈[+1535,+1660]mm · 10 of 30 slices shown, 13 images]
[im 3/30  brain]
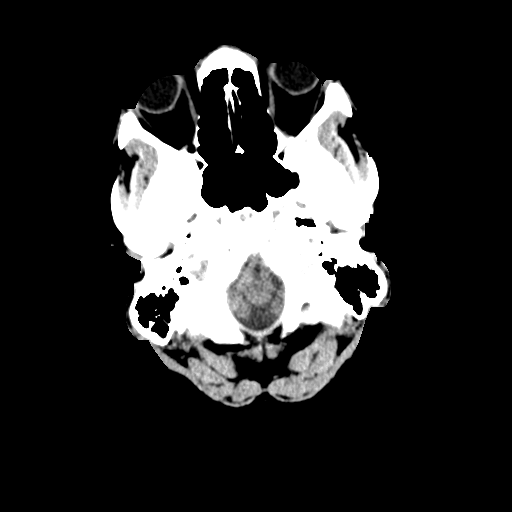
[im 3/30  bone]
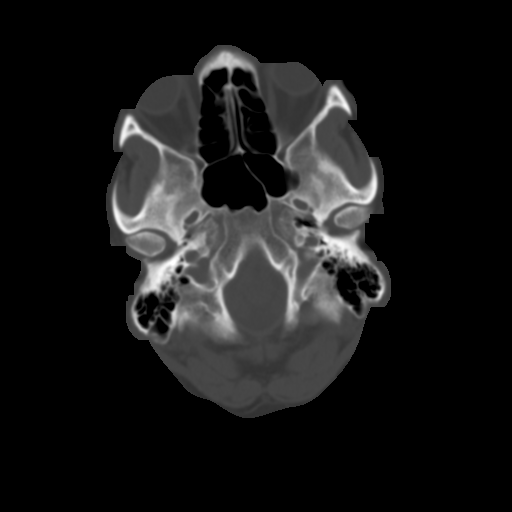
[im 6/30  brain]
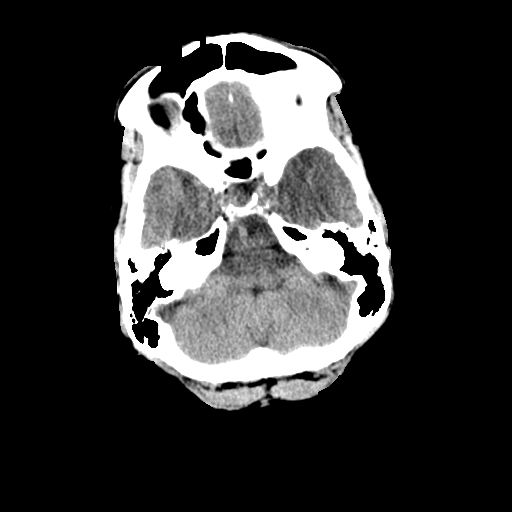
[im 9/30  brain]
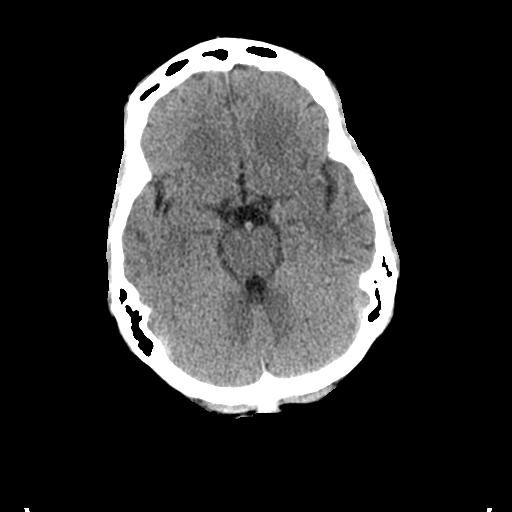
[im 11/30  brain]
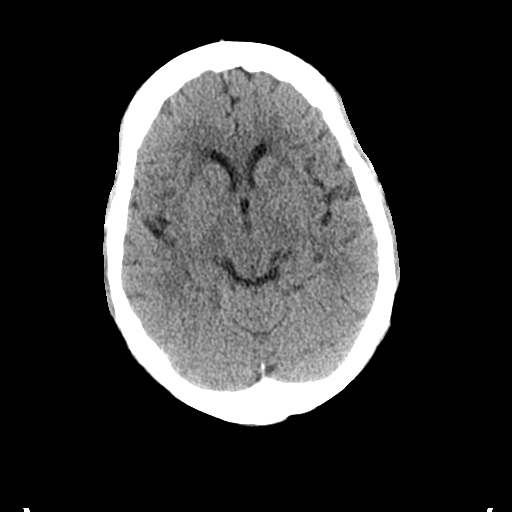
[im 14/30  brain]
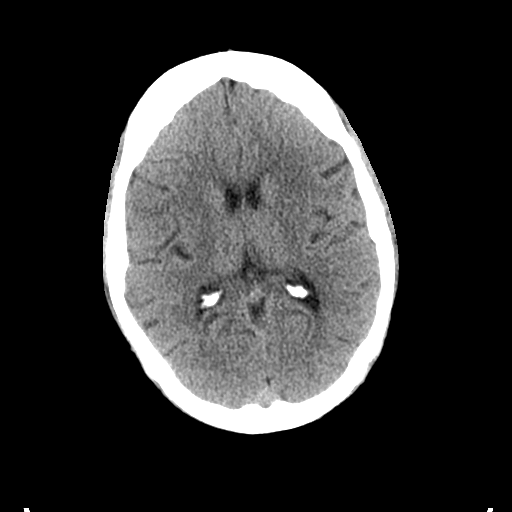
[im 14/30  bone]
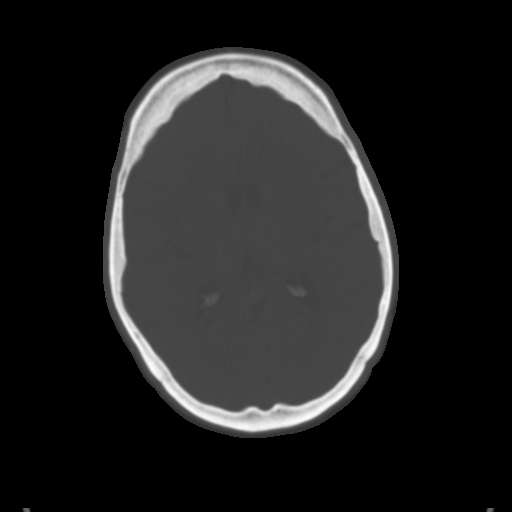
[im 17/30  brain]
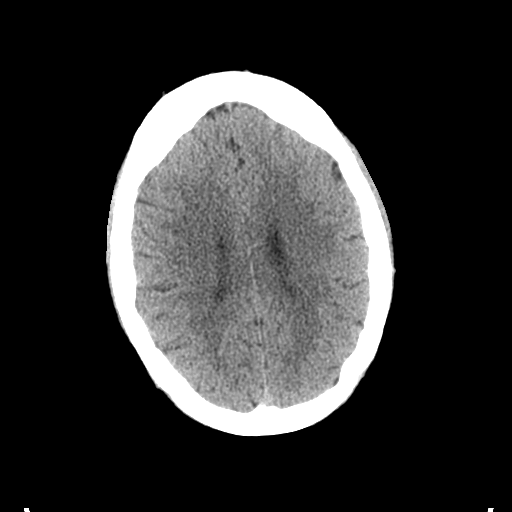
[im 20/30  brain]
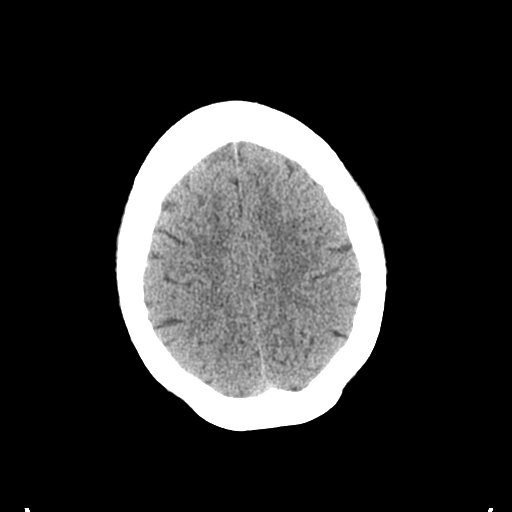
[im 23/30  brain]
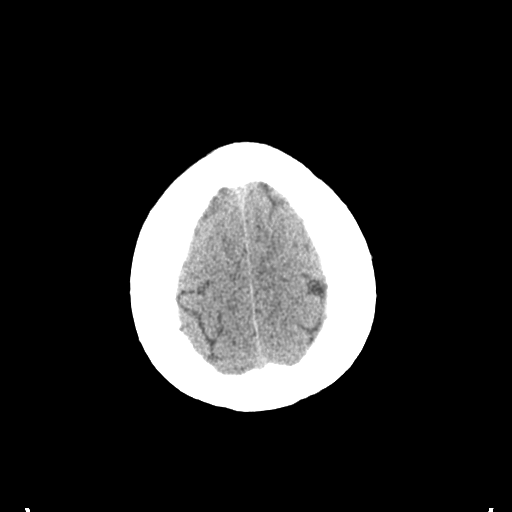
[im 25/30  brain]
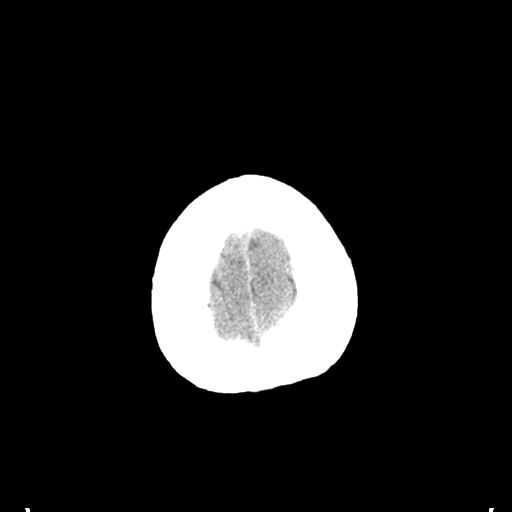
[im 25/30  bone]
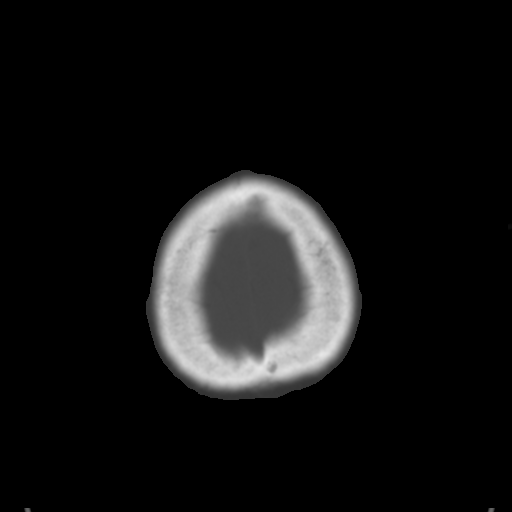
[im 28/30  brain]
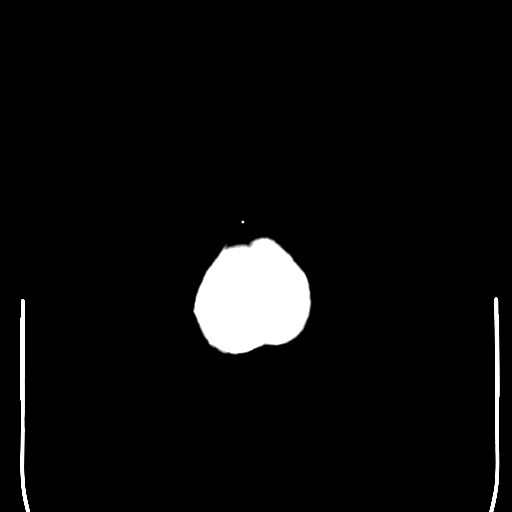

[Series 5: coronal soft tissue · coronal · 0.30mm/px · 3 of 65 slices shown]
[im 22/65  brain]
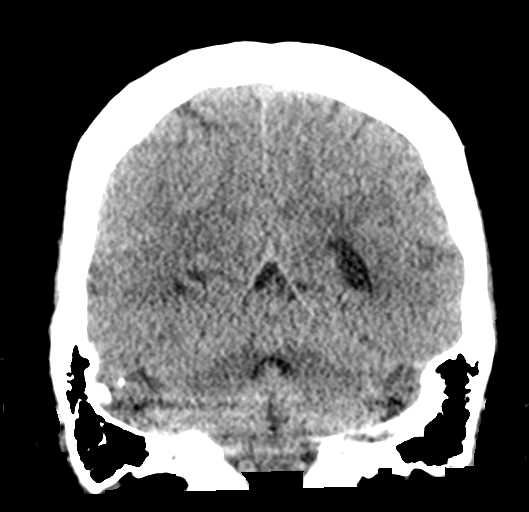
[im 29/65  brain]
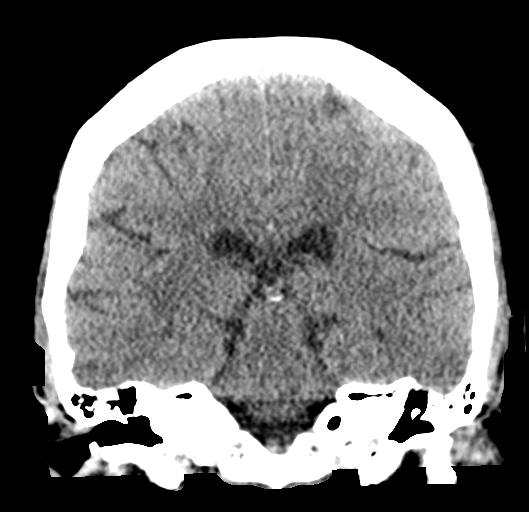
[im 36/65  brain]
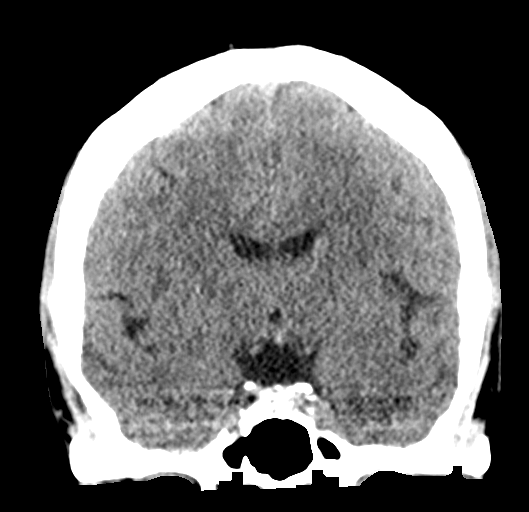

[Series 6: sagittal soft tissue · sagittal · 0.30mm/px · 3 of 53 slices shown]
[im 18/53  brain]
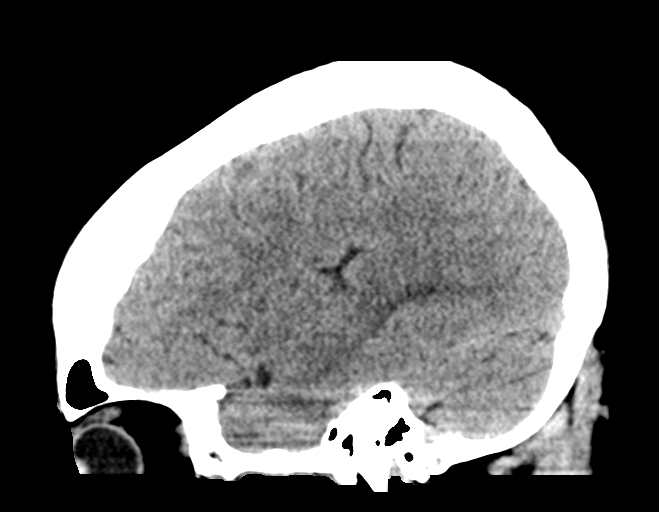
[im 27/53  brain]
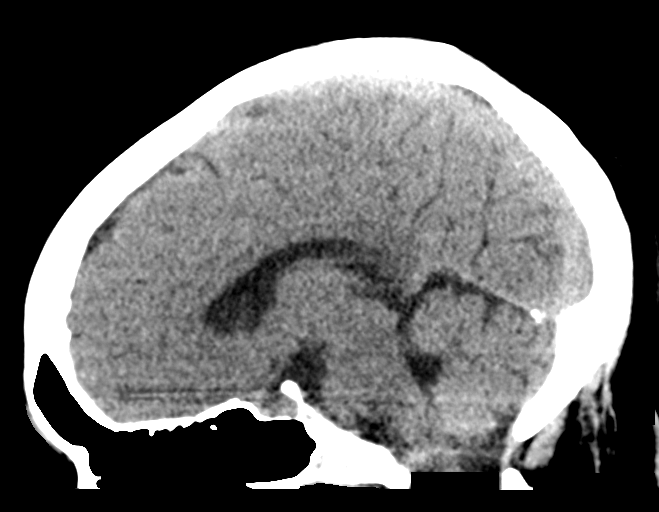
[im 35/53  brain]
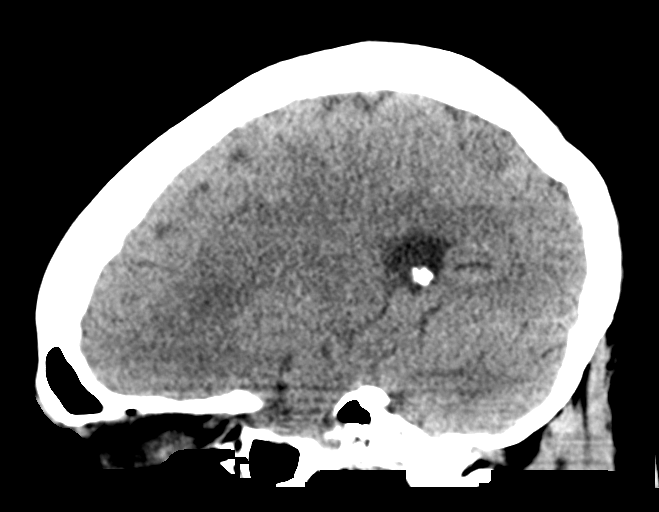

[16 of 47 positions shown; findings below may reference images not displayed]

FINDINGS: Brain: Patchy white matter hypodensity greater on the left and
corresponding to FLAIR abnormality on the CHEO MRI. No midline
shift, ventriculomegaly, mass effect, evidence of mass lesion,
intracranial hemorrhage or evidence of cortically based acute
infarction. No cortical encephalomalacia identified.

Vascular: Mild Calcified atherosclerosis at the skull base. No
suspicious intracranial vascular hyperdensity.

Skull: Stable, negative.

Sinuses/Orbits: Visualized paranasal sinuses and mastoids are clear.

Other: Visualized orbits and scalp soft tissues are within normal
limits.
IMPRESSION: 1. No acute intracranial abnormality.
2. White matter changes stable from CHEO MRI.

## 2019-12-23 MED ORDER — INSULIN ASPART 100 UNIT/ML ~~LOC~~ SOLN
10.0000 [IU] | Freq: Once | SUBCUTANEOUS | Status: AC
  Administered 2019-12-23: 10 [IU] via SUBCUTANEOUS
  Filled 2019-12-23: qty 0.1

## 2019-12-23 MED ORDER — SODIUM CHLORIDE 0.9 % IV BOLUS
1000.0000 mL | Freq: Once | INTRAVENOUS | Status: AC
  Administered 2019-12-23: 1000 mL via INTRAVENOUS

## 2019-12-23 MED ORDER — PROCHLORPERAZINE EDISYLATE 10 MG/2ML IJ SOLN
10.0000 mg | Freq: Once | INTRAMUSCULAR | Status: AC
  Administered 2019-12-23: 10 mg via INTRAVENOUS
  Filled 2019-12-23: qty 2

## 2019-12-23 MED ORDER — DIPHENHYDRAMINE HCL 50 MG/ML IJ SOLN
12.5000 mg | Freq: Once | INTRAMUSCULAR | Status: AC
  Administered 2019-12-23: 12.5 mg via INTRAVENOUS
  Filled 2019-12-23: qty 1

## 2019-12-23 MED ORDER — ONDANSETRON HCL 4 MG/2ML IJ SOLN
4.0000 mg | Freq: Once | INTRAMUSCULAR | Status: DC
  Filled 2019-12-23: qty 2

## 2019-12-23 NOTE — Progress Notes (Signed)
Chief Complaint  Patient presents with  . Dizziness    Pt stated that she has been having some dizzy spells for the last 3 days and glucose has been running high.    HPI   Patient presents essentially in DKA.  She has had extreme polyuria, polydipsia, now dizzy spells with extreme fatigue.  When she gets up today on meeting her she almost passed out.  I had her husband come into the room and we discussed management at this point.  I do believe that she needs to go to the hospital for management of her extreme hyperglycemia.  She has had sugars that have gone up to 800 and then back down to the high 300s low 400s.  She was given instructions on taking Novolin R before breakfast along with Metformin by Dr. Pamella Pert but she is not in the office today.  Patient did not take the Novolin R because she has not eaten breakfast this morning and is nauseous.  She has had a hard time getting what she needed when going to the hospital.  I suggested a higher evening dose of basal insulin after she gets some control from hospital evaluation.  Problem List    Problem List: 2021-04: Elevated glucose 2021-04: Diabetic ketoacidosis without coma associated with type 2  diabetes mellitus ([place]) 2020-03: De Quervain's tenosynovitis, left 2019-08: Bilateral carpal tunnel syndrome 2019-01: Leukocytosis 2018-11: Hyperlipidemia 2018-11: Ulcerative colitis without complications ([place]) 7858-85: Plantar fasciitis   Allergies   is allergic to codeine and latex.  Medications    Current Outpatient Medications:  .  acetaminophen (TYLENOL) 500 MG tablet, Take 500 mg by mouth every 6 (six) hours as needed for mild pain or headache., Disp: , Rfl:  .  amLODipine (NORVASC) 5 MG tablet, Take 5 mg by mouth at bedtime. , Disp: , Rfl:  .  atorvastatin (LIPITOR) 20 MG tablet, Take 1 tablet (20 mg total) by mouth daily., Disp: 90 tablet, Rfl: 3 .  cyclobenzaprine (FLEXERIL) 10 MG tablet, Take 1 tablet (10 mg total) by  mouth 3 (three) times daily as needed for muscle spasms., Disp: 30 tablet, Rfl: 4 .  DULoxetine (CYMBALTA) 60 MG capsule, Take 1 capsule (60 mg total) by mouth daily., Disp: 90 capsule, Rfl: 1 .  fluticasone (FLONASE) 50 MCG/ACT nasal spray, SPRAY 2 SPRAYS INTO EACH NOSTRIL EVERY DAY (Patient taking differently: Place 2 sprays into both nostrils daily. ), Disp: 16 mL, Rfl: 2 .  glucose blood (ONETOUCH VERIO) test strip, Use as instructed, Disp: 100 each, Rfl: 12 .  Insulin Glargine (BASAGLAR KWIKPEN) 100 UNIT/ML, INJECT 20 UNITS INTO THE SKIN AT BEDTIME., Disp: 15 mL, Rfl: 0 .  Insulin Syringe-Needle U-100 (INSULIN SYRINGE 1CC/31GX5/16") 31G X 5/16" 1 ML MISC, Inject 0.08 ( 8 Units)  into the skin 3 times daily before meals., Disp: 100 each, Rfl: 3 .  Lancets (ONETOUCH DELICA PLUS [id number]) MISC, 1 each by Does not apply route 3 (three) times daily. Dx E11.65, z79.4, Disp: 100 each, Rfl: 11 .  metFORMIN (GLUCOPHAGE) 500 MG tablet, TAKE 1 TABLET BY MOUTH TWICE DAILY FOR TWO WEEKS. THEN TAKE 2 TABLETS BY MOUTH TWICE DAILY. (Patient taking differently: Take 500 mg by mouth daily with breakfast. ), Disp: 360 tablet, Rfl: 2 .  NONFORMULARY OR COMPOUNDED ITEM, Swish and spit 5 mLs as needed (ulcer flare up in mouth). Magic mouthwash with lidocaine, Disp: , Rfl:  .  mesalamine (LIALDA) 1.2 g EC tablet, Take 2 tablets (2.4 g total) by  mouth 2 (two) times daily. (Patient taking differently: Take 4.8 g by mouth daily with breakfast. ), Disp: 360 tablet, Rfl: 3   Review of Systems    Positive for fatigue, polyuria, polydipsia, chest pain, dizziness, fatigue.    Physical Exam:    height is 5' 2"  (1.575 m) and weight is 153 lb 6.4 oz (69.6 kg). Her temporal temperature is 97.6 F (36.4 C). Her blood pressure is 137/86 and her pulse is 77. Her oxygen saturation is 99%.  Constitutional:  Patient fatigued, unfocused, seems in pain Chest: clear to auscultation, no wheezes, rales or rhonchi, symmetric air entry.   CVS exam: normal rate, regular rhythm, normal S1, S2, no murmurs, rubs, clicks or gallops. Skin exam - normal coloration and turgor, no rashes, no suspicious skin lesions noted. Mental Status: Patient is able to respond appropriately, looks like she is about to pass out.  Neurological:  Holding on to wall to move .  Lab /Imaging Review   Blood sugar is 396 today Assessment & Plan:  Beverly Ramirez is a 59 y.o. female    1. Elevated glucose   2. Diabetic ketoacidosis without coma associated with type 2 diabetes mellitus ([place])    Due to the patient's persistent polyuria, polydipsia, uncontrolled blood sugars, fatigue, and now unsteadiness on her feet I have recommended that she go to the ER immediately.  To me it does not matter which emergency room she goes to.  We will follow-up in 1 week with Dr. Pamella Pert for better management of her blood sugar.   Glyn Ade, NP

## 2019-12-23 NOTE — ED Provider Notes (Signed)
[place] [facility] DEPT Provider Note   CSN: [id number] Arrival date & time: 12/23/19  2694     History Chief Complaint  Patient presents with  . Hyperglycemia  . Nausea    Beverly Ramirez is a 59 y.o. female.  The history is provided by the patient.  Hyperglycemia Blood sugar level PTA:  >400 Severity:  Mild Onset quality:  Gradual Timing:  Constant Progression:  Unchanged Chronicity:  New Diabetes status:  Controlled with insulin Current diabetic therapy:  Lantus, novolin Context: change in medication   Context: not noncompliance   Relieved by:  Nothing Ineffective treatments:  None tried Associated symptoms: dehydration, dizziness, increased appetite, increased thirst, nausea and weakness   Associated symptoms: no abdominal pain, no altered mental status, no chest pain, no dysuria, no fever, no shortness of breath and no vomiting   Risk factors: hx of DKA        Past Medical History:  Diagnosis Date  . Anemia   . Carpal tunnel syndrome    bilateral  . Chronic heel pain   . Diabetes mellitus without complication ([place])   . DJD (degenerative joint disease) of cervical spine    MRI 2017  . Hyperlipidemia   . Plantar fasciitis    bilateral  . Sickle cell anemia (HCC)    sickle cell trait  . Sleep apnea   . Tendonitis    left hand/wrist  . UC (ulcerative colitis) [facility])     Patient Active Problem List   Diagnosis Date Noted  . Elevated glucose 12/23/2019  . Diabetic ketoacidosis without coma associated with type 2 diabetes mellitus ([place]) 12/23/2019  . De Quervain's tenosynovitis, left 11/16/2018  . Bilateral carpal tunnel syndrome 04/13/2018  . Leukocytosis 10/04/2017  . Hyperlipidemia 08/04/2017  . Ulcerative colitis without complications ([place]) 85/46/2703  . Plantar fasciitis 05/28/2017    Past Surgical History:  Procedure Laterality Date  . ABDOMINAL HYSTERECTOMY    . APPENDECTOMY    . CESAREAN SECTION     x4  .  COLONOSCOPY     Multiple in [place]  . ESOPHAGOGASTRODUODENOSCOPY       OB History    Gravida  4   Para  4   Term      Preterm      AB      Living  4     SAB      TAB      Ectopic      Multiple      Live Births              Family History  Problem Relation Age of Onset  . Stroke Mother   . Diabetes Mother   . Hypertension Mother   . Lung cancer Mother   . Sickle cell trait Mother   . Breast cancer Maternal Aunt   . Diabetes Maternal Aunt   . Lung cancer Maternal Aunt   . Lung cancer Maternal Aunt   . Lung cancer Maternal Aunt   . Colon cancer Cousin 71  . Sickle cell trait Father   . Sickle cell anemia Sister 2  . Lung cancer Maternal Grandmother   . Sickle cell anemia Sister   . Other Sister        accident and blood clot formed    Social History   Tobacco Use  . Smoking status: Former Smoker    Types: Cigarettes    Quit date: 10/13/2006    Years since quitting: 13.2  .  Smokeless tobacco: Never Used  Substance Use Topics  . Alcohol use: Yes    Comment: occasional  . Drug use: No    Home Medications Prior to Admission medications   Medication Sig Start Date End Date Taking? Authorizing Provider  acetaminophen (TYLENOL) 500 MG tablet Take 500 mg by mouth every 6 (six) hours as needed for mild pain or headache.    [provider]  amLODipine (NORVASC) 5 MG tablet Take 5 mg by mouth at bedtime.  12/15/19   [provider]  atorvastatin (LIPITOR) 20 MG tablet Take 1 tablet (20 mg total) by mouth daily. 07/28/19   Rutherford Guys, MD  cyclobenzaprine (FLEXERIL) 10 MG tablet Take 1 tablet (10 mg total) by mouth 3 (three) times daily as needed for muscle spasms. 09/26/19   Rutherford Guys, MD  DULoxetine (CYMBALTA) 60 MG capsule Take 1 capsule (60 mg total) by mouth daily. 09/26/19   Rutherford Guys, MD  fluticasone (FLONASE) 50 MCG/ACT nasal spray SPRAY 2 SPRAYS INTO EACH NOSTRIL EVERY DAY Patient taking differently: Place 2  sprays into both nostrils daily.  12/14/19   Rutherford Guys, MD  glucose blood [facility] VERIO) test strip Use as instructed 12/16/19   Rutherford Guys, MD  Insulin Glargine [facility]) 100 UNIT/ML INJECT 20 UNITS INTO THE SKIN AT BEDTIME. 12/16/19   Rutherford Guys, MD  Insulin Syringe-Needle U-100 (INSULIN SYRINGE 1CC/31GX5/16") 31G X 5/16" 1 ML MISC Inject 0.08 ( 8 Units)  into the skin 3 times daily before meals. 10/16/18   Rutherford Guys, MD  Lancets [facility] DELICA PLUS [id number]) MISC 1 each by Does not apply route 3 (three) times daily. Dx E11.65, z79.4 01/24/19   Rutherford Guys, MD  mesalamine (LIALDA) 1.2 g EC tablet Take 2 tablets (2.4 g total) by mouth 2 (two) times daily. Patient taking differently: Take 4.8 g by mouth daily with breakfast.  11/25/18 12/15/19  Gatha Mayer, MD  metFORMIN (GLUCOPHAGE) 500 MG tablet TAKE 1 TABLET BY MOUTH TWICE DAILY FOR TWO WEEKS. THEN TAKE 2 TABLETS BY MOUTH TWICE DAILY. Patient taking differently: Take 500 mg by mouth daily with breakfast.  02/22/19   Rutherford Guys, MD  NONFORMULARY OR COMPOUNDED ITEM Swish and spit 5 mLs as needed (ulcer flare up in mouth). Magic mouthwash with lidocaine    [provider]  NOVOLIN R 100 UNIT/ML injection Inject 8 Units into the skin 3 (three) times daily. 12/16/19   [provider]    Allergies    Codeine and Latex  Review of Systems   Review of Systems  Constitutional: Negative for chills and fever.  HENT: Negative for ear pain and sore throat.   Eyes: Negative for pain and visual disturbance.  Respiratory: Negative for cough and shortness of breath.   Cardiovascular: Negative for chest pain and palpitations.  Gastrointestinal: Positive for nausea. Negative for abdominal pain and vomiting.  Endocrine: Positive for polydipsia.  Genitourinary: Negative for dysuria and hematuria.  Musculoskeletal: Negative for arthralgias and back pain.  Skin: Negative for color change and rash.    Neurological: Positive for dizziness and weakness. Negative for tremors, seizures, syncope, facial asymmetry, speech difficulty, light-headedness, numbness and headaches.  All other systems reviewed and are negative.   Physical Exam Updated Vital Signs  ED Triage Vitals  Enc Vitals Group     BP 12/23/19 0936 (!) 133/91     Pulse Rate 12/23/19 0936 74     Resp 12/23/19  0936 18     Temp 12/23/19 0936 97.8 F (36.6 C)     Temp Source 12/23/19 0936 Oral     SpO2 12/23/19 0936 100 %     Weight --      Height 12/23/19 0947 5' 2"  (1.575 m)     Head Circumference --      Peak Flow --      Pain Score 12/23/19 0947 0     Pain Loc --      Pain Edu? --      Excl. in [place]? --     Physical Exam Vitals and nursing note reviewed.  Constitutional:      General: She is not in acute distress.    Appearance: She is well-developed. She is not ill-appearing.  HENT:     Head: Normocephalic and atraumatic.     Nose: Nose normal.     Mouth/Throat:     Mouth: Mucous membranes are dry.  Eyes:     Extraocular Movements: Extraocular movements intact.     Conjunctiva/sclera: Conjunctivae normal.     Pupils: Pupils are equal, round, and reactive to light.  Cardiovascular:     Rate and Rhythm: Normal rate and regular rhythm.     Heart sounds: No murmur.  Pulmonary:     Effort: Pulmonary effort is normal. No respiratory distress.     Breath sounds: Normal breath sounds.  Abdominal:     General: Abdomen is flat.     Palpations: Abdomen is soft.     Tenderness: There is no abdominal tenderness.  Musculoskeletal:        General: No swelling or tenderness. Normal range of motion.     Cervical back: Normal range of motion and neck supple. No rigidity.  Skin:    General: Skin is warm and dry.  Neurological:     General: No focal deficit present.     Mental Status: She is alert and oriented to person, place, and time.     Cranial Nerves: No cranial nerve deficit.     Sensory: No sensory deficit.      Motor: No weakness.     Coordination: Coordination normal.     Gait: Gait normal.     Comments: 5+ out of 5 strength throughout, normal sensation, no drift, normal finger-to-nose finger, no visual field deficit, normal heel-to-shin     ED Results / Procedures / Treatments   Labs (all labs ordered are listed, but only abnormal results are displayed) Labs Reviewed  BASIC METABOLIC PANEL - Abnormal; Notable for the following components:      Result Value   Glucose, Bld 380 (*)    All other components within normal limits  CBC - Abnormal; Notable for the following components:   WBC 10.6 (*)    All other components within normal limits  URINALYSIS, ROUTINE W REFLEX MICROSCOPIC - Abnormal; Notable for the following components:   Color, Urine AMBER (*)    Glucose, UA >=500 (*)    Bacteria, UA RARE (*)    All other components within normal limits  HEPATIC FUNCTION PANEL - Abnormal; Notable for the following components:   ALT 62 (*)    Alkaline Phosphatase 155 (*)    All other components within normal limits  CBG MONITORING, ED - Abnormal; Notable for the following components:   Glucose-Capillary 345 (*)    All other components within normal limits  CBG MONITORING, ED - Abnormal; Notable for the following components:   Glucose-Capillary 215 (*)  All other components within normal limits  BLOOD GAS, VENOUS  CBG MONITORING, ED    EKG EKG Interpretation  Date/Time:  Friday December 23 2019 10:13:54 EDT Ventricular Rate:  60 PR Interval:    QRS Duration: 80 QT Interval:  397 QTC Calculation: 397 R Axis:   6 Text Interpretation: Sinus rhythm Atrial premature complex Abnormal R-wave progression, early transition Baseline wander in lead(s) V6 Confirmed by Lennice Sites [phone]) on 12/23/2019 10:27:14 AM   Radiology CT Head Wo Contrast  Result Date: 12/23/2019 CLINICAL DATA:  59 year old female with headache dizziness and nausea for 3 days. EXAM: CT HEAD WITHOUT CONTRAST  TECHNIQUE: Contiguous axial images were obtained from the base of the skull through the vertex without intravenous contrast. COMPARISON:  Head CT 08/04/2019. Brain MRI 09/21/2019. FINDINGS: Brain: Patchy white matter hypodensity greater on the left and corresponding to FLAIR abnormality on the January MRI. No midline shift, ventriculomegaly, mass effect, evidence of mass lesion, intracranial hemorrhage or evidence of cortically based acute infarction. No cortical encephalomalacia identified. Vascular: Mild Calcified atherosclerosis at the skull base. No suspicious intracranial vascular hyperdensity. Skull: Stable, negative. Sinuses/Orbits: Visualized paranasal sinuses and mastoids are clear. Other: Visualized orbits and scalp soft tissues are within normal limits. IMPRESSION: 1. No acute intracranial abnormality. 2. White matter changes stable from January MRI. Electronically Signed   By: Genevie Ann M.D.   On: 12/23/2019 15:15    Procedures Procedures (including critical care time)  Medications Ordered in ED Medications  sodium chloride 0.9 % bolus 1,000 mL (0 mLs Intravenous Stopped 12/23/19 1412)  prochlorperazine (COMPAZINE) injection 10 mg (10 mg Intravenous Given 12/23/19 1023)  diphenhydrAMINE (BENADRYL) injection 12.5 mg (12.5 mg Intravenous Given 12/23/19 1023)  sodium chloride 0.9 % bolus 1,000 mL (0 mLs Intravenous Stopped 12/23/19 1153)  insulin aspart (novoLOG) injection 10 Units (10 Units Subcutaneous Given 12/23/19 1153)    ED Course  I have reviewed the triage vital signs and the nursing notes.  Pertinent labs & imaging results that were available during my care of the patient were reviewed by me and considered in my medical decision making (see chart for details).    MDM Rules/Calculators/A&P                      Beverly Ramirez is a 59 year old female with history of diabetes/insulin-dependent who presents to the ED with nausea, vomiting, increased thirst, dehydration,  lightheadedness.  Recent admission for DKA.  Blood sugar around 400 at primary care clinic today and sent to rule out DKA.  Patient with overall unremarkable vitals.  Neurologically she has normal exam.  She is slightly unsteady walking but has normal exam otherwise.  She feels dehydrated.  Her mouth is dry.  Recently changed medications.  Denies any infectious symptoms including cough, urinary symptoms.  Will give IV fluids, antiemetics and reevaluate.  Will check for DKA.  CT scan the head was unremarkable.  Headache has improved following Compazine.  No concern for stroke at this time as neurological exam is unremarkable.  Symptoms have improved greatly with IV fluids and insulin.  Patient not in DKA.  Blood sugar down from 400-200.  Able to tolerate p.o.  No urine infection.  No other significant electrolyte abnormality, kidney injury, leukocytosis.  Recommend continued management with primary care doctor and discharged from ED in good condition.  Told to return to the ED symptoms worsen.  This chart was dictated using voice recognition software.  Despite best efforts to proofread,  errors can occur which can change the documentation meaning.    Final Clinical Impression(s) / ED Diagnoses Final diagnoses:  Hyperglycemia    Rx / DC Orders ED Discharge Orders    None       Lennice Sites, DO 12/23/19 1520

## 2019-12-23 NOTE — ED Triage Notes (Signed)
Patient c/o dizziness and nausea x 3 days. Patient states she has not taken her insulin this AM. Patient states she talked with her PCP yesterday and was told to come to the ED today.

## 2019-12-23 NOTE — Patient Instructions (Signed)
° ° ° °  If you have lab work done today you will be contacted with your lab results within the next 2 weeks.  If you have not heard from us then please contact us. The fastest way to get your results is to register for My Chart. ° ° °IF you received an x-ray today, you will receive an invoice from [facility]. Please contact [facility] at [phone] with questions or concerns regarding your invoice.  ° °IF you received labwork today, you will receive an invoice from [facility]. Please contact [facility] at [phone] with questions or concerns regarding your invoice.  ° °Our billing staff will not be able to assist you with questions regarding bills from these companies. ° °You will be contacted with the lab results as soon as they are available. The fastest way to get your results is to activate your My Chart account. Instructions are located on the last page of this paperwork. If you have not heard from us regarding the results in 2 weeks, please contact this office. °  ° ° ° °

## 2019-12-24 LAB — MICROALBUMIN, URINE: Microalbumin, Urine: 23.7 ug/mL

## 2019-12-26 ENCOUNTER — Encounter: Payer: Self-pay | Admitting: Family Medicine

## 2019-12-26 ENCOUNTER — Ambulatory Visit (INDEPENDENT_AMBULATORY_CARE_PROVIDER_SITE_OTHER): Payer: Medicare Other | Admitting: Family Medicine

## 2019-12-26 ENCOUNTER — Telehealth: Payer: Self-pay | Admitting: Adult Health Nurse Practitioner

## 2019-12-26 ENCOUNTER — Other Ambulatory Visit: Payer: Self-pay

## 2019-12-26 VITALS — BP 116/76 | HR 69 | Temp 97.7°F | Ht 62.0 in | Wt 151.0 lb

## 2019-12-26 DIAGNOSIS — E1165 Type 2 diabetes mellitus with hyperglycemia: Secondary | ICD-10-CM | POA: Diagnosis not present

## 2019-12-26 DIAGNOSIS — Z794 Long term (current) use of insulin: Secondary | ICD-10-CM

## 2019-12-26 DIAGNOSIS — R42 Dizziness and giddiness: Secondary | ICD-10-CM | POA: Diagnosis not present

## 2019-12-26 LAB — POCT URINALYSIS DIP (MANUAL ENTRY)
Bilirubin, UA: NEGATIVE
Glucose, UA: NEGATIVE mg/dL
Ketones, POC UA: NEGATIVE mg/dL
Leukocytes, UA: NEGATIVE
Nitrite, UA: NEGATIVE
Protein Ur, POC: 30 mg/dL — AB
Spec Grav, UA: 1.025 (ref 1.010–1.025)
Urobilinogen, UA: 0.2 E.U./dL
pH, UA: 5 (ref 5.0–8.0)

## 2019-12-26 LAB — GLUCOSE, POCT (MANUAL RESULT ENTRY): POC Glucose: 183 mg/dl — AB (ref 70–99)

## 2019-12-26 MED ORDER — BASAGLAR KWIKPEN 100 UNIT/ML ~~LOC~~ SOPN: 40.0000 [IU] | PEN_INJECTOR | Freq: Every day | SUBCUTANEOUS | 0 refills | Status: DC

## 2019-12-26 MED ORDER — NOVOLOG FLEXPEN 100 UNIT/ML ~~LOC~~ SOPN: 15.0000 [IU] | PEN_INJECTOR | Freq: Three times a day (TID) | SUBCUTANEOUS | 11 refills | Status: DC

## 2019-12-26 MED ORDER — METFORMIN HCL 500 MG PO TABS: 500.0000 mg | ORAL_TABLET | Freq: Two times a day (BID) | ORAL | 2 refills | Status: DC

## 2019-12-26 NOTE — Patient Instructions (Addendum)
Hypoglycemia Hypoglycemia is when the sugar (glucose) level in your blood is too low. Signs of low blood sugar may include:  Feeling: ? Hungry. ? Worried or nervous (anxious). ? Sweaty and clammy. ? Confused. ? Dizzy. ? Sleepy. ? Sick to your stomach (nauseous).  Having: ? A fast heartbeat. ? A headache. ? A change in your vision. ? Tingling or no feeling (numbness) around your mouth, lips, or tongue. ? Jerky movements that you cannot control (seizure).  Having trouble with: ? Moving (coordination). ? Sleeping. ? Passing out (fainting). ? Getting upset easily (irritability). Low blood sugar can happen to people who have diabetes and people who do not have diabetes. Low blood sugar can happen quickly, and it can be an emergency. Treating low blood sugar Low blood sugar is often treated by eating or drinking something sugary right away, such as:  Fruit juice, 4-6 oz (120-150 mL).  Regular soda (not diet soda), 4-6 oz (120-150 mL).  Low-fat milk, 4 oz (120 mL).  Several pieces of hard candy.  Sugar or honey, 1 Tbsp (15 mL). Treating low blood sugar if you have diabetes If you can think clearly and swallow safely, follow the 15:15 rule:  Take 15 grams of a fast-acting carb (carbohydrate). Talk with your doctor about how much you should take.  Always keep a source of fast-acting carb with you, such as: ? Sugar tablets (glucose pills). Take 3-4 pills. ? 6-8 pieces of hard candy. ? 4-6 oz (120-150 mL) of fruit juice. ? 4-6 oz (120-150 mL) of regular (not diet) soda. ? 1 Tbsp (15 mL) honey or sugar.  Check your blood sugar 15 minutes after you take the carb.  If your blood sugar is still at or below 70 mg/dL (3.9 mmol/L), take 15 grams of a carb again.  If your blood sugar does not go above 70 mg/dL (3.9 mmol/L) after 3 tries, get help right away.  After your blood sugar goes back to normal, eat a meal or a snack within 1 hour.  Treating very low blood sugar If  your blood sugar is at or below 54 mg/dL (3 mmol/L), you have very low blood sugar (severe hypoglycemia). This may also cause:  Passing out.  Jerky movements you cannot control (seizure).  Losing consciousness (coma). This is an emergency. Do not wait to see if the symptoms will go away. Get medical help right away. Call your local emergency services (911 in the [place] Do not drive yourself to the hospital. If you have very low blood sugar and you cannot eat or drink, you may need a glucagon shot (injection). A family member or friend should learn how to check your blood sugar and how to give you a glucagon shot. Ask your doctor if you need to have a glucagon shot kit at home. Follow these instructions at home: General instructions  Take over-the-counter and prescription medicines only as told by your doctor.  Stay aware of your blood sugar as told by your doctor.  Limit alcohol intake to no more than 1 drink a day for nonpregnant women and 2 drinks a day for men. One drink equals 12 oz of beer (355 mL), 5 oz of wine (148 mL), or 1 oz of hard liquor (44 mL).  Keep all follow-up visits as told by your doctor. This is important. If you have diabetes:   Follow your diabetes care plan as told by your doctor. Make sure you: ? Know the signs of low blood  sugar. ? Take your medicines as told. ? Follow your exercise and meal plan. ? Eat on time. Do not skip meals. ? Check your blood sugar as often as told by your doctor. Always check it before and after exercise. ? Follow your sick day plan when you cannot eat or drink normally. Make this plan ahead of time with your doctor.  Share your diabetes care plan with: ? Your work or school. ? People you live with.  Check your pee (urine) for ketones: ? When you are sick. ? As told by your doctor.  Carry a card or wear jewelry that says you have diabetes. Contact a doctor if:  You have trouble keeping your blood sugar in your target  range.  You have low blood sugar often. Get help right away if:  You still have symptoms after you eat or drink something sugary.  Your blood sugar is at or below 54 mg/dL (3 mmol/L).  You have jerky movements that you cannot control.  You pass out. These symptoms may be an emergency. Do not wait to see if the symptoms will go away. Get medical help right away. Call your local emergency services (911 in the [place] Do not drive yourself to the hospital. Summary  Hypoglycemia happens when the level of sugar (glucose) in your blood is too low.  Low blood sugar can happen to people who have diabetes and people who do not have diabetes. Low blood sugar can happen quickly, and it can be an emergency.  Make sure you know the signs of low blood sugar and know how to treat it.  Always keep a source of sugar (fast-acting carb) with you to treat low blood sugar. This information is not intended to replace advice given to you by your health care provider. Make sure you discuss any questions you have with your health care provider. Document Revised: 12/16/2018 Document Reviewed: 09/28/2015 Elsevier Patient Education  [employer].    If you have lab work done today you will be contacted with your lab results within the next 2 weeks.  If you have not heard from [place] then please contact us. The fastest way to get your results is to register for My Chart.   IF you received an x-ray today, you will receive an invoice from [facility]. Please contact [facility] at [phone] with questions or concerns regarding your invoice.   IF you received labwork today, you will receive an invoice from [facility]. Please contact [facility] at [phone] with questions or concerns regarding your invoice.   Our billing staff will not be able to assist you with questions regarding bills from these companies.  You will be contacted with the lab results as soon as they are available. The  fastest way to get your results is to activate your My Chart account. Instructions are located on the last page of this paperwork. If you have not heard from [place] regarding the results in 2 weeks, please contact this office.

## 2019-12-26 NOTE — Progress Notes (Signed)
4/19/20211:40 PM  Beverly Ramirez April 15, 1961, 59 y.o., female [id number]  Chief Complaint  Patient presents with  . Diabetes    dizziness, headaches ER follow ups    HPI:   Patient is a 59 y.o. female with past medical history significant for ulcerative colitis, HLP, HTN, DM2, fibromyalgia who presents today for ER followup  Patient has had multiple ER visits due to hyperglycemia most recent was 3 days ago, glc 380, no DKA, no UTI IVF, benadryl, compazine, novolog 10 units, glc 215 Normal head ct scan ER increased lantus to 50 units a bedtime  She has been doing to lantus 40 units since she went to ER Regular insulin 10units AC Metformin 572m BID She has not had hypoglycemia but has started to have sx when she gets readings in the 90s She has been cutting back on carbs, but struggling with what to eat as she notices her sugars are going up Glucometer reviewed Past week range 400 - 92, mostly in 200s  She has been having spinning room sensation associated with vomiting when she looks up or lies down She has not been using meclizine, she does have some at home  Wt Readings from Last 3 Encounters:  12/26/19 151 lb (68.5 kg)  12/23/19 153 lb 6.4 oz (69.6 kg)  12/19/19 150 lb (68 kg)   Lab Results  Component Value Date   HGBA1C 7.1 (H) 07/25/2019   HGBA1C 7.6 (H) 03/25/2019   HGBA1C 9.5 (A) 11/10/2018   Lab Results  Component Value Date   LDLCALC 139 (H) 07/25/2019   CREATININE 0.80 12/23/2019    Depression screen PHQ 2/9 12/23/2019 12/23/2019 09/26/2019  Decreased Interest 0 0 0  Down, Depressed, Hopeless 0 0 0  PHQ - 2 Score 0 0 0    Fall Risk  12/23/2019 12/23/2019 12/15/2019 09/26/2019 08/18/2019  Falls in the past year? 0 0 0 1 0  Number falls in past yr: 0 0 - 1 0  Injury with Fall? 0 0 - 1 0  Follow up Falls evaluation completed Falls evaluation completed Falls evaluation completed - -     Allergies  Allergen Reactions  . Codeine Itching, Rash and  Hives  . Latex Hives    With the power    Prior to Admission medications   Medication Sig Start Date End Date Taking? Authorizing Provider  acetaminophen (TYLENOL) 500 MG tablet Take 500 mg by mouth every 6 (six) hours as needed for mild pain or headache.   Yes [provider]  amLODipine (NORVASC) 5 MG tablet Take 5 mg by mouth at bedtime.  12/15/19  Yes [provider]  atorvastatin (LIPITOR) 20 MG tablet Take 1 tablet (20 mg total) by mouth daily. 07/28/19  Yes SRutherford Guys MD  cyclobenzaprine (FLEXERIL) 10 MG tablet Take 1 tablet (10 mg total) by mouth 3 (three) times daily as needed for muscle spasms. 09/26/19  Yes SRutherford Guys MD  DULoxetine (CYMBALTA) 60 MG capsule Take 1 capsule (60 mg total) by mouth daily. 09/26/19  Yes SRutherford Guys MD  fluticasone (FLONASE) 50 MCG/ACT nasal spray SPRAY 2 SPRAYS INTO EACH NOSTRIL EVERY DAY Patient taking differently: Place 2 sprays into both nostrils daily.  12/14/19  Yes SRutherford Guys MD  glucose blood [facility] test strip Use as instructed 12/16/19  Yes SRutherford Guys MD  Insulin Glargine ([facility] 100 UNIT/ML INJECT 20 UNITS INTO THE SKIN AT BEDTIME. 12/16/19  Yes SRutherford Guys MD  Insulin Syringe-Needle U-100 (INSULIN SYRINGE 1CC/31GX5/16") 31G X 5/16" 1 ML MISC Inject 0.08 ( 8 Units)  into the skin 3 times daily before meals. 10/16/18  Yes Rutherford Guys, MD  Lancets (ONETOUCH DELICA PLUS [id number]) [facility] 1 each by Does not apply route 3 (three) times daily. Dx E11.65, z79.4 01/24/19  Yes Rutherford Guys, MD  metFORMIN (GLUCOPHAGE) 500 MG tablet TAKE 1 TABLET BY MOUTH TWICE DAILY FOR TWO WEEKS. THEN TAKE 2 TABLETS BY MOUTH TWICE DAILY. Patient taking differently: Take 500 mg by mouth daily with breakfast.  02/22/19  Yes Rutherford Guys, MD  NONFORMULARY OR COMPOUNDED ITEM Swish and spit 5 mLs as needed (ulcer flare up in mouth). Magic mouthwash with lidocaine   Yes [provider]  NOVOLIN  R 100 UNIT/ML injection Inject 8 Units into the skin 3 (three) times daily. 12/16/19  Yes [provider]  mesalamine (LIALDA) 1.2 g EC tablet Take 2 tablets (2.4 g total) by mouth 2 (two) times daily. Patient taking differently: Take 4.8 g by mouth daily with breakfast.  11/25/18 12/15/19  Gatha Mayer, MD    Past Medical History:  Diagnosis Date  . Anemia   . Carpal tunnel syndrome    bilateral  . Chronic heel pain   . Diabetes mellitus without complication ([place])   . DJD (degenerative joint disease) of cervical spine    MRI 2017  . Hyperlipidemia   . Plantar fasciitis    bilateral  . Sickle cell anemia (HCC)    sickle cell trait  . Sleep apnea   . Tendonitis    left hand/wrist  . UC (ulcerative colitis) ([place])     Past Surgical History:  Procedure Laterality Date  . ABDOMINAL HYSTERECTOMY    . APPENDECTOMY    . CESAREAN SECTION     x4  . COLONOSCOPY     Multiple in [place]  . ESOPHAGOGASTRODUODENOSCOPY      Social History   Tobacco Use  . Smoking status: Former Smoker    Types: Cigarettes    Quit date: 10/13/2006    Years since quitting: 13.2  . Smokeless tobacco: Never Used  Substance Use Topics  . Alcohol use: Yes    Comment: occasional    Family History  Problem Relation Age of Onset  . Stroke Mother   . Diabetes Mother   . Hypertension Mother   . Lung cancer Mother   . Sickle cell trait Mother   . Breast cancer Maternal Aunt   . Diabetes Maternal Aunt   . Lung cancer Maternal Aunt   . Lung cancer Maternal Aunt   . Lung cancer Maternal Aunt   . Colon cancer Cousin 42  . Sickle cell trait Father   . Sickle cell anemia Sister 47  . Lung cancer Maternal Grandmother   . Sickle cell anemia Sister   . Other Sister        accident and blood clot formed    Review of Systems  Constitutional: Negative for chills and fever.  HENT: Negative for hearing loss and tinnitus.   Eyes: Negative for blurred vision.  Respiratory: Negative for cough and  shortness of breath.   Cardiovascular: Negative for chest pain, palpitations and leg swelling.  Gastrointestinal: Positive for nausea and vomiting. Negative for abdominal pain.  Genitourinary: Negative for frequency.  Neurological: Positive for dizziness and headaches. Negative for speech change and focal weakness.  Endo/Heme/Allergies: Negative for polydipsia.     OBJECTIVE:  Today's Vitals  12/26/19 1330  BP: 116/76  Pulse: 69  Temp: 97.7 F (36.5 C)  SpO2: 99%  Weight: 151 lb (68.5 kg)  Height: 5' 2"  (1.575 m)   Body mass index is 27.62 kg/m.   Physical Exam Vitals and nursing note reviewed.  Constitutional:      Appearance: She is well-developed.  HENT:     Head: Normocephalic and atraumatic.     Right Ear: Tympanic membrane, ear canal and external ear normal.     Left Ear: Tympanic membrane, ear canal and external ear normal.     Mouth/Throat:     Pharynx: No oropharyngeal exudate.  Eyes:     General: No scleral icterus.    Extraocular Movements: Extraocular movements intact.     Right eye: No nystagmus.     Left eye: No nystagmus.     Conjunctiva/sclera: Conjunctivae normal.     Pupils: Pupils are equal, round, and reactive to light.  Neck:     Vascular: No carotid bruit.  Cardiovascular:     Rate and Rhythm: Normal rate and regular rhythm.     Pulses: Normal pulses.     Heart sounds: Normal heart sounds. No murmur. No friction rub. No gallop.   Pulmonary:     Effort: Pulmonary effort is normal.     Breath sounds: Normal breath sounds. No wheezing, rhonchi or rales.  Abdominal:     General: Bowel sounds are normal. There is no distension.     Palpations: Abdomen is soft. There is no mass.     Tenderness: There is no abdominal tenderness.  Musculoskeletal:     Cervical back: Neck supple.     Right lower leg: No edema.     Left lower leg: No edema.  Skin:    General: Skin is warm and dry.  Neurological:     Mental Status: She is alert and oriented to  person, place, and time.     Cranial Nerves: No cranial nerve deficit.     Motor: No weakness.     Coordination: Coordination normal.     Gait: Gait normal.     Deep Tendon Reflexes: Reflexes normal.      Diabetic Foot Form - Detailed   Diabetic Foot Exam - detailed Diabetic Foot exam was performed with the following findings: Yes 12/26/2019  1:42 PM  Visual Foot Exam completed.: Yes  Pulse Foot Exam completed.: Yes  Sensory Foot Exam Completed.: Yes Semmes-Weinstein Monofilament Test   Comments: Pt reports diminished sensitivities in both great toes     Results for orders placed or performed in visit on 12/26/19 (from the past 24 hour(s))  POCT glucose (manual entry)     Status: Abnormal   Collection Time: 12/26/19  1:51 PM  Result Value Ref Range   POC Glucose 183 (A) 70 - 99 mg/dl  POCT urinalysis dipstick     Status: Abnormal   Collection Time: 12/26/19  2:12 PM  Result Value Ref Range   Color, UA yellow yellow   Clarity, UA clear clear   Glucose, UA negative negative mg/dL   Bilirubin, UA negative negative   Ketones, POC UA negative negative mg/dL   Spec Grav, UA 1.025 1.010 - 1.025   Blood, UA trace-intact (A) negative   pH, UA 5.0 5.0 - 8.0   Protein Ur, POC =30 (A) negative mg/dL   Urobilinogen, UA 0.2 0.2 or 1.0 E.U./dL   Nitrite, UA Negative Negative   Leukocytes, UA Negative Negative    No  results found.   ASSESSMENT and PLAN  1. Type 2 diabetes mellitus with hyperglycemia, with long-term current use of insulin (HCC) Uncontrolled. Cont lantus 40 units. Changed novolin - R to novolog 10 units plus on sliding scale of 2 units per 50 above 130. Discussed carb counting and ssx/mgt of hypoglycemia. Patient educational handouts given.  - HM Diabetes Foot Exam - POCT urinalysis dipstick - POCT glucose (manual entry)  2. Vertigo Normal neuro exam and recent head ct scan. Start using meclizine prn. Consider referral.   Other orders - Insulin Glargine  (BASAGLAR KWIKPEN) 100 UNIT/ML; Inject 0.4 mLs (40 Units total) into the skin at bedtime. - metFORMIN (GLUCOPHAGE) 500 MG tablet; Take 1 tablet (500 mg total) by mouth 2 (two) times daily with a meal. TAKE 1 TABLET BY MOUTH TWICE DAILY FOR TWO WEEKS. THEN TAKE 2 TABLETS BY MOUTH TWICE DAILY. - insulin aspart (NOVOLOG FLEXPEN) 100 UNIT/ML FlexPen; Inject 15 Units into the skin 3 (three) times daily with meals.  Return in about 1 week (around 01/02/2020).    Rutherford Guys, MD Primary Care at [place], [place] [address] Ph.  [phone] Fax [phone]

## 2019-12-26 NOTE — Telephone Encounter (Signed)
12/26/2019 - PATIENT SAW SARAH BYRD ON Friday (12/23/2019) FOR HIGH BLOOD SUGAR. SARAH HAS REQUESTED PATIENT RETURN FOR FOLLOW-UP TO SEE HER PRIMARY PROVIDER (DR. IRMA) IN 1 WEEK. I TRIED TO CALL HER THIS MORNING (Monday - 12/26/2019) TO SCHEDULE BUT HAD TO LEAVE HER A VOICE MAIL TO RETURN MY CALL. WHEN I LOOKED AT HER CHART I SAW WHERE SHE WENT TO [place] ON Friday (12/23/2019) WITH HYPERGLYCEMIA. SHE ALSO HAD AN APPOINTMENT THAT HAD ALREADY BEEN SCHEDULED TO SEE DR. IRMA TODAY (12/26/2019). I AM NOT SURE IF IT NEEDS TO BE CANCELLED OR NOT. [place]

## 2020-01-02 ENCOUNTER — Encounter: Payer: Self-pay | Admitting: Obstetrics & Gynecology

## 2020-01-02 ENCOUNTER — Other Ambulatory Visit: Payer: Self-pay

## 2020-01-02 ENCOUNTER — Ambulatory Visit (INDEPENDENT_AMBULATORY_CARE_PROVIDER_SITE_OTHER): Payer: Medicare Other | Admitting: Obstetrics & Gynecology

## 2020-01-02 VITALS — BP 124/80 | Ht 62.5 in | Wt 152.0 lb

## 2020-01-02 DIAGNOSIS — Z9189 Other specified personal risk factors, not elsewhere classified: Secondary | ICD-10-CM | POA: Diagnosis not present

## 2020-01-02 DIAGNOSIS — Z1272 Encounter for screening for malignant neoplasm of vagina: Secondary | ICD-10-CM

## 2020-01-02 DIAGNOSIS — Z780 Asymptomatic menopausal state: Secondary | ICD-10-CM

## 2020-01-02 DIAGNOSIS — Z01419 Encounter for gynecological examination (general) (routine) without abnormal findings: Secondary | ICD-10-CM | POA: Diagnosis not present

## 2020-01-02 DIAGNOSIS — Z90711 Acquired absence of uterus with remaining cervical stump: Secondary | ICD-10-CM

## 2020-01-02 NOTE — Patient Instructions (Signed)
1. Encounter for routine gynecological examination with Papanicolaou smear of cervix Gynecologic exam status post supracervical hysterectomy in menopause.  Pap reflex done.  Breast exam normal.  Will schedule a screening mammogram.  Colonoscopy 2017.  Health labs with family physician.  Diabetes mellitus type 2 out of control recently, started on insulin.  Referred to endocrinology and nutritionist.  BMI 27.36.  Aerobic activities 5 times a week with light weight lifting every 2 days.  2. H/O abdominal supracervical subtotal hysterectomy  3. Postmenopause Well on no hormone replacement therapy.  Bone density November 2020 was normal.  Vitamin D supplements, calcium intake of 1200 mg daily and regular weightbearing physical activity is recommended.  Beverly Ramirez, it was a pleasure seeing you today!  I will inform you of your results as soon as they are available.

## 2020-01-02 NOTE — Progress Notes (Signed)
Beverly Ramirez 1961/01/26 [id number]   History:    59 y.o. G27P4L4 Married  RP:  Established patient presenting for annual gyn exam   HPI:  S/P Supracervical Hysterectomy for Fibroids.  Postmenopause, well on no HRT.  No pelvic pain.  No pain with IC.  Ulcerative Colitis under control on Meds.  Urine normal.  Breasts wnl.  BMI 27.36.  DM type 2 uncontrolled recently, started on Insuline.  Referred to Endocrino and Nutritionist.   Past medical history,surgical history, family history and social history were all reviewed and documented in the EPIC chart.  Gynecologic History No LMP recorded. Patient has had a hysterectomy.  Obstetric History OB History  Gravida Para Term Preterm AB Living  4 4       4   SAB TAB Ectopic Multiple Live Births               # Outcome Date GA Lbr Len/2nd Weight Sex Delivery Anes PTL Lv  4 Para           3 Para           2 Para           1 Para              ROS: A ROS was performed and pertinent positives and negatives are included in the history.  GENERAL: No fevers or chills. HEENT: No change in vision, no earache, sore throat or sinus congestion. NECK: No pain or stiffness. CARDIOVASCULAR: No chest pain or pressure. No palpitations. PULMONARY: No shortness of breath, cough or wheeze. GASTROINTESTINAL: No abdominal pain, nausea, vomiting or diarrhea, melena or bright red blood per rectum. GENITOURINARY: No urinary frequency, urgency, hesitancy or dysuria. MUSCULOSKELETAL: No joint or muscle pain, no back pain, no recent trauma. DERMATOLOGIC: No rash, no itching, no lesions. ENDOCRINE: No polyuria, polydipsia, no heat or cold intolerance. No recent change in weight. HEMATOLOGICAL: No anemia or easy bruising or bleeding. NEUROLOGIC: No headache, seizures, numbness, tingling or weakness. PSYCHIATRIC: No depression, no loss of interest in normal activity or change in sleep pattern.     Exam:   BP 124/80   Ht 5' 2.5" (1.588 m)   Wt 152 lb (68.9  kg)   BMI 27.36 kg/m   Body mass index is 27.36 kg/m.  General appearance : Well developed well nourished female. No acute distress HEENT: Eyes: no retinal hemorrhage or exudates,  Neck supple, trachea midline, no carotid bruits, no thyroidmegaly Lungs: Clear to auscultation, no rhonchi or wheezes, or rib retractions  Heart: Regular rate and rhythm, no murmurs or gallops Breast:Examined in sitting and supine position were symmetrical in appearance, no palpable masses or tenderness,  no skin retraction, no nipple inversion, no nipple discharge, no skin discoloration, no axillary or supraclavicular lymphadenopathy Abdomen: no palpable masses or tenderness, no rebound or guarding Extremities: no edema or skin discoloration or tenderness  Pelvic: Vulva: Normal             Vagina: No gross lesions or discharge  Cervix: No gross lesions or discharge.  Pap reflex done.  Uterus  Absent  Adnexa  Without masses or tenderness  Anus: Normal   Assessment/Plan:  59 y.o. female for annual exam   1. Encounter for routine gynecological examination with Papanicolaou smear of cervix Gynecologic exam status post supracervical hysterectomy in menopause.  Pap reflex done.  Breast exam normal.  Will schedule a screening mammogram.  Colonoscopy 2017.  Health labs with family physician.  Diabetes mellitus type 2 out of control recently, started on insulin.  Referred to endocrinology and nutritionist.  BMI 27.36.  Aerobic activities 5 times a week with light weight lifting every 2 days.  2. H/O abdominal supracervical subtotal hysterectomy  3. Postmenopause Well on no hormone replacement therapy.  Bone density November 2020 was normal.  Vitamin D supplements, calcium intake of 1200 mg daily and regular weightbearing physical activity is recommended.  Princess Bruins MD, 3:31 PM 01/02/2020

## 2020-01-02 NOTE — Addendum Note (Signed)
Addended by: Thurnell Garbe A on: 01/02/2020 04:13 PM   Modules accepted: Orders

## 2020-01-03 ENCOUNTER — Telehealth: Payer: Self-pay

## 2020-01-03 ENCOUNTER — Telehealth (INDEPENDENT_AMBULATORY_CARE_PROVIDER_SITE_OTHER): Payer: Medicare Other | Admitting: Family Medicine

## 2020-01-03 ENCOUNTER — Other Ambulatory Visit: Payer: Self-pay

## 2020-01-03 ENCOUNTER — Encounter: Payer: Self-pay | Admitting: Family Medicine

## 2020-01-03 DIAGNOSIS — R42 Dizziness and giddiness: Secondary | ICD-10-CM | POA: Diagnosis not present

## 2020-01-03 DIAGNOSIS — E1165 Type 2 diabetes mellitus with hyperglycemia: Secondary | ICD-10-CM

## 2020-01-03 DIAGNOSIS — H538 Other visual disturbances: Secondary | ICD-10-CM

## 2020-01-03 DIAGNOSIS — Z794 Long term (current) use of insulin: Secondary | ICD-10-CM

## 2020-01-03 LAB — PAP IG W/ RFLX HPV ASCU

## 2020-01-03 NOTE — Progress Notes (Signed)
Virtual Visit Note  I connected with patient on 01/03/20 at 529pm by phone (unable to connect via video) and verified that I am speaking with the correct person using two identifiers. Beverly Ramirez is currently located at home and patient is currently with them during visit. The provider, Rutherford Guys, MD is located in their office at time of visit.  I discussed the limitations, risks, security and privacy concerns of performing an evaluation and management service by telephone and the availability of in person appointments. I also discussed with the patient that there may be a patient responsible charge related to this service. The patient expressed understanding and agreed to proceed.   I provided 25 minutes of non-face-to-face time during this encounter.  Chief Complaint  Patient presents with  . Diabetes    pt is feeling well, still having dizzy spells and blurred vison almost contantly  pt chacked sugar on the phone with me and that was 20    HPI   PMH: ulcerative colitis, HLP, HTN, DM2, fibromyalgia   Last OV 2 weeks ago - changed bolus insulin, started ISS cbgs have been coming down, random just before visit, 82, ranging 90-80s, lowest 71 Has started to not take insulin (novolog) as they are starting to come down (using novolog if cbg 150 or higher)  She has continued with lantus and metformin  Continues to have vertigo, bilateral blurry vision (constant for past 3 days) and headaches that started in setting of hyperglycemia Denies tinnitus, denies any hearing loss Worse when she moves her head up and down Has been taking meclizine but not helping CT scan April 2021 - stable white matter changes MRI brain Jan 2021 - white matter changes; right sided small schwanomma, saw Dr Kathyrn Sheriff, neurosurg Jan 2021 - per patient due to size should not be symptomatic, yearly surveillance Had eye exam in Jan 2021- just needed reading glasses  Having UC flare up  Has appt with  neuro tomorrow   Allergies  Allergen Reactions  . Codeine Itching, Rash and Hives  . Latex Hives    With the power    Prior to Admission medications   Medication Sig Start Date End Date Taking? Authorizing Provider  acetaminophen (TYLENOL) 500 MG tablet Take 500 mg by mouth every 6 (six) hours as needed for mild pain or headache.    [provider]  amLODipine (NORVASC) 5 MG tablet Take 5 mg by mouth at bedtime.  12/15/19   [provider]  atorvastatin (LIPITOR) 20 MG tablet Take 1 tablet (20 mg total) by mouth daily. 07/28/19   Rutherford Guys, MD  cyclobenzaprine (FLEXERIL) 10 MG tablet Take 1 tablet (10 mg total) by mouth 3 (three) times daily as needed for muscle spasms. 09/26/19   Rutherford Guys, MD  DULoxetine (CYMBALTA) 60 MG capsule Take 1 capsule (60 mg total) by mouth daily. 09/26/19   Rutherford Guys, MD  fluticasone (FLONASE) 50 MCG/ACT nasal spray SPRAY 2 SPRAYS INTO EACH NOSTRIL EVERY DAY Patient taking differently: Place 2 sprays into both nostrils daily.  12/14/19   Rutherford Guys, MD  glucose blood [facility] VERIO) test strip Use as instructed 12/16/19   Rutherford Guys, MD  insulin aspart (NOVOLOG FLEXPEN) 100 UNIT/ML FlexPen Inject 15 Units into the skin 3 (three) times daily with meals. 12/26/19   Rutherford Guys, MD  Insulin Glargine [facility] KWIKPEN) 100 UNIT/ML Inject 0.4 mLs (40 Units total) into the skin at bedtime. 12/26/19  Rutherford Guys, MD  Insulin Syringe-Needle U-100 (INSULIN SYRINGE 1CC/31GX5/16") 31G X 5/16" 1 ML MISC Inject 0.08 ( 8 Units)  into the skin 3 times daily before meals. 10/16/18   Rutherford Guys, MD  Lancets [facility] DELICA PLUS [id number]) MISC 1 each by Does not apply route 3 (three) times daily. Dx E11.65, z79.4 01/24/19   Rutherford Guys, MD  mesalamine (LIALDA) 1.2 g EC tablet Take 2 tablets (2.4 g total) by mouth 2 (two) times daily. Patient taking differently: Take 4.8 g by mouth daily with breakfast.  11/25/18 12/15/19   Gatha Mayer, MD  metFORMIN (GLUCOPHAGE) 500 MG tablet Take 1 tablet (500 mg total) by mouth 2 (two) times daily with a meal. TAKE 1 TABLET BY MOUTH TWICE DAILY FOR TWO WEEKS. THEN TAKE 2 TABLETS BY MOUTH TWICE DAILY. 12/26/19   Rutherford Guys, MD  NONFORMULARY OR COMPOUNDED ITEM Swish and spit 5 mLs as needed (ulcer flare up in mouth). Magic mouthwash with lidocaine    [provider]    Past Medical History:  Diagnosis Date  . Anemia   . Carpal tunnel syndrome    bilateral  . Chronic heel pain   . Diabetes mellitus without complication ([place])   . DJD (degenerative joint disease) of cervical spine    MRI 2017  . Hyperlipidemia   . Plantar fasciitis    bilateral  . Sickle cell anemia (HCC)    sickle cell trait  . Sleep apnea   . Tendonitis    left hand/wrist  . UC (ulcerative colitis) ([place])     Past Surgical History:  Procedure Laterality Date  . ABDOMINAL HYSTERECTOMY    . APPENDECTOMY    . CESAREAN SECTION     x4  . COLONOSCOPY     Multiple in [place]  . ESOPHAGOGASTRODUODENOSCOPY      Social History   Tobacco Use  . Smoking status: Former Smoker    Types: Cigarettes    Quit date: 10/13/2006    Years since quitting: 13.2  . Smokeless tobacco: Never Used  Substance Use Topics  . Alcohol use: Yes    Comment: occasional    Family History  Problem Relation Age of Onset  . Stroke Mother   . Diabetes Mother   . Hypertension Mother   . Lung cancer Mother   . Sickle cell trait Mother   . Breast cancer Maternal Aunt   . Diabetes Maternal Aunt   . Lung cancer Maternal Aunt   . Lung cancer Maternal Aunt   . Lung cancer Maternal Aunt   . Colon cancer Cousin 61  . Sickle cell trait Father   . Sickle cell anemia Sister 110  . Lung cancer Maternal Grandmother   . Sickle cell anemia Sister   . Other Sister        accident and blood clot formed    ROS Per hpi  Objective  Vitals as reported by the patient: none   ASSESSMENT and PLAN  1. Type  2 diabetes mellitus with hyperglycemia, with long-term current use of insulin (HCC) cbgs stabilizing. Cont with current regime  2. Blurry vision, bilateral Acute for past 3 days. Discussed needs to see her eye doctor within 24-48 hours.  3. Vertigo Not resolving. Etiology unclear. Sees neurology tomorrow, defer to them.   FOLLOW-UP: 4 weeks   The above assessment and management plan was discussed with the patient. The patient verbalized understanding of and has agreed to the management plan.  Patient is aware to call the clinic if symptoms persist or worsen. Patient is aware when to return to the clinic for a follow-up visit. Patient educated on when it is appropriate to go to the emergency department.     Rutherford Guys, MD Primary Care at [facility] [place], [place] [address] Ph.  [phone] Fax [phone]

## 2020-01-03 NOTE — Telephone Encounter (Signed)
I called the pt and left a vm asking her to bring her auto pap machine to her 01/04/2020 f/u.

## 2020-01-03 NOTE — Patient Instructions (Signed)
° ° ° °  If you have lab work done today you will be contacted with your lab results within the next 2 weeks.  If you have not heard from us then please contact us. The fastest way to get your results is to register for My Chart. ° ° °IF you received an x-ray today, you will receive an invoice from [facility]. Please contact [facility] at [phone] with questions or concerns regarding your invoice.  ° °IF you received labwork today, you will receive an invoice from [facility]. Please contact [facility] at [phone] with questions or concerns regarding your invoice.  ° °Our billing staff will not be able to assist you with questions regarding bills from these companies. ° °You will be contacted with the lab results as soon as they are available. The fastest way to get your results is to activate your My Chart account. Instructions are located on the last page of this paperwork. If you have not heard from us regarding the results in 2 weeks, please contact this office. °  ° ° ° °

## 2020-01-04 ENCOUNTER — Ambulatory Visit (INDEPENDENT_AMBULATORY_CARE_PROVIDER_SITE_OTHER): Payer: Medicare Other | Admitting: Neurology

## 2020-01-04 ENCOUNTER — Telehealth: Payer: Self-pay | Admitting: Family Medicine

## 2020-01-04 ENCOUNTER — Encounter: Payer: Self-pay | Admitting: Neurology

## 2020-01-04 VITALS — BP 120/80 | HR 80 | Temp 98.1°F | Ht 62.0 in | Wt 151.0 lb

## 2020-01-04 DIAGNOSIS — R42 Dizziness and giddiness: Secondary | ICD-10-CM

## 2020-01-04 DIAGNOSIS — G4733 Obstructive sleep apnea (adult) (pediatric): Secondary | ICD-10-CM | POA: Diagnosis not present

## 2020-01-04 DIAGNOSIS — R519 Headache, unspecified: Secondary | ICD-10-CM

## 2020-01-04 NOTE — Telephone Encounter (Signed)
01/04/2020 - PATIENT HAD A MY-CHART APPOINTMENT WITH DR. Benay Spice ON Tuesday (01/03/2020). DR. Benay Spice HAS REQUESTED PATIENT FOLLOW-UP WITH HER IN 1 MONTH. I TRIED TO CALL AND SCHEDULE BUT HAD TO LEAVE HER A VOICE MAIL TO RETURN MY CALL. SHE ALREADY HAS AN APPOINTMENT ON Friday (01/20/2020), BUT IT IS ABOUT 2 WEEKS TOO EARLY. [place]

## 2020-01-04 NOTE — Progress Notes (Signed)
Subjective:    Patient ID: Beverly Ramirez is a 59 y.o. female.  HPI     Interim history:   Beverly Ramirez is a 59 year old right-handed woman with an underlying medical history of ulcerative colitis, overweight state, hyperlipidemia, degenerative disc disease, anemia, as well as sleep disturbance, who Presents for follow-up consultation of her obstructive sleep apnea and recurrent headaches, after interim sleep testing.  The patient is unaccompanied today.  I last saw her on 08/23/2019 at the request of her primary care physician, at which time the patient reported recurrent headaches for the past several weeks.  She had a recent head CT.  She had been taken off of Mobic because of exacerbation of her ulcerative colitis.  She was advised to proceed with a brain MRI and sleep study as she had previously been referred for sleep evaluation but had not had her sleep study.  She had a brain MRI w/wo contrast on 09/21/19 and I reviewed the results:   IMPRESSION: Abnormal MRI scan of the brain showing bilateral periventricular and subcortical white matter hyperintensities with the differential discussed above.  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     We called her with her test results.  I requested evaluation w neurosurgery.   She had a Baseline sleep study on 09/25/2019 which showed mild to moderate obstructive sleep apnea with a total AHI of 11.5/h, REM AHI of 24.8/h, O2 nadir of 88%. She had an increased percentage of light stage sleep and a decreased percentage of REM sleep.  Sleep efficiency was also reduced at 79.4%. Given her history of recurrent headaches she was advised to proceed with AutoPap therapy at home.  She was called with her test results in January.    Today, 01/04/2020: she reports that she did not get any phone call to get set up with the AutoPap machine.  She admits that she also did not call  our office after she did not hear back about getting set up with AutoPap therapy.  She reports that she still has dizzy spells.  She went to the emergency room recently with headache and significant hyperglycemia.  She reports that her primary care physician wants to send her to a specialist for her diabetes care.  Patient reports blurry vision intermittently but cannot tell me when she had her last eye examination.  She typically goes to the [facility] within [place].  She also indicates that she had seen an eye specialist but does not recall when and who she saw.  She also reports that her primary care physician made a referral to ENT but she has not been seen.  She denies any sudden onset of one-sided weakness or numbness or tingling.  She would be willing to get started on AutoPap therapy.  We went over her test results.  She reports that she did not get any test results previously.  I explained to her that my nurse called her in January.  She tries to hydrate well.  She does report some mouth dryness.  She reports that her sugar numbers have been high.  She had an interim CT head wo contrast on 12/23/19 and I reviewed the results: IMPRESSION: 1. No acute intracranial abnormality. 2. White matter changes stable from January MRI.   Previously:   08/23/19: (She) reports recurrent headaches in the back of her head for the past several weeks.  She had a recent head CT on 08/04/2019 without contrast  and I reviewed the results: IMPRESSION: Normal head CT.   She was recently taken off of Mobic because it exacerbated her ulcerative colitis.  She tries to hydrate well, she estimates that she drinks about a quart of water per day.  She drinks tea, 2 servings per day and occasional soda.  She still does not sleep well, she has nocturnal and morning headaches.  She never had a sleep study, I saw her last year in November for sleep evaluation but a sleep study did not come to fruition.  Her Epworth sleepiness score  is 8 out of 24, fatigue severity score is 32 out of 63.  She would be willing to proceed with a sleep study at this time.  She reports intermittent dizzy spells and spinning sensations with positional changes.  She has intermittent ringing in the ears, bilaterally.  The headaches start in the back of her head and are not necessarily correlated with the vertigo symptoms.  She has not seen ENT.  She has had some muffled hearing, unclear which ear.  She has had some nausea intermittently with the vertigo symptoms.  She has some light sensitivity with the headache but tends to be in the back of her head or in the upper neck area bilaterally.  It is a dull achy sensation typically.  She has no history of migraines before.  For her dizziness she has tried meclizine.  She denies any sudden onset of one-sided weakness or numbness or tingling or droopy face or slurring of speech.   07/20/2018: Beverly Ramirez is a 59 year old right-handed woman with an underlying medical history of hyperlipidemia, degenerative disc disease, anemia, ulcerative colitis and overweight state, who reports snoring and excessive daytime somnolence. I reviewed your office note from 05/07/2018. She has had recurrent headaches including morning headaches. Her Epworth sleepiness score is 8 out of 24, fatigue score is 15 out of 63. She does snore, she does not wake herself up with gasping, but husband has mentioned pauses in her breathing while she is asleep. She does not sleep well, does not wake up fully rested. Bedtime is around 11 PM, but wakes up in the middle of the night. She wakes up around 6:45 AM, when her daughter leaves for college. She has nocturia about 2-3 times per night. Weight has been stable, has a nephew with OSA.  She does not endorse RLS symptoms. She has a Hx of bronchitis and pneumonia. Quit smoking about 10 years ago, rare EtOH, drinks no daily caffeine. She does not currently work. She lives with her husband and her  53 year old daughter. She has a total of 4 children.   Her Past Medical History Is Significant For: Past Medical History:  Diagnosis Date  . Anemia   . Carpal tunnel syndrome    bilateral  . Chronic heel pain   . Diabetes mellitus without complication ([place])   . DJD (degenerative joint disease) of cervical spine    MRI 2017  . Hyperlipidemia   . Plantar fasciitis    bilateral  . Sickle cell anemia ([facility])    sickle cell trait  . Sleep apnea   . Tendonitis    left hand/wrist  . UC (ulcerative colitis) ([place])     Her Past Surgical History Is Significant For: Past Surgical History:  Procedure Laterality Date  . ABDOMINAL HYSTERECTOMY    . APPENDECTOMY    . CESAREAN SECTION     x4  . COLONOSCOPY     Multiple in [place]  .  ESOPHAGOGASTRODUODENOSCOPY      Her Family History Is Significant For: Family History  Problem Relation Age of Onset  . Stroke Mother   . Diabetes Mother   . Hypertension Mother   . Lung cancer Mother   . Sickle cell trait Mother   . Breast cancer Maternal Aunt   . Diabetes Maternal Aunt   . Lung cancer Maternal Aunt   . Lung cancer Maternal Aunt   . Lung cancer Maternal Aunt   . Colon cancer Cousin 41  . Sickle cell trait Father   . Sickle cell anemia Sister 21  . Lung cancer Maternal Grandmother   . Sickle cell anemia Sister   . Other Sister        accident and blood clot formed    Her Social History Is Significant For: Social History   Socioeconomic History  . Marital status: Married    Spouse name: Simona Huh  . Number of children: 4  . Years of education: Not on file  . Highest education level: Not on file  Occupational History  . Occupation: retired    Comment: He formerly worked for [employer] of [employer], disabled due to a fall  Tobacco Use  . Smoking status: Former Smoker    Types: Cigarettes    Quit date: 10/13/2006    Years since quitting: 13.2  . Smokeless tobacco: Never Used  Substance and Sexual Activity  .  Alcohol use: Yes    Comment: occasional  . Drug use: No  . Sexual activity: Yes    Partners: Male    Comment: 1ST intercourse- 30, partners- 59, married- 78 yrs   Other Topics Concern  . Not on file  Social History Narrative   She is married, 3 sons born 1979, 1981, 1984.  One daughter born in 76.   She is medically retired from the [employer] after a fall and a hip injury, around 2004.   Caffeinated beverage every other day x1   07/13/2017   Social Determinants of Health   Financial Resource Strain:   . Difficulty of Paying Living Expenses:   Food Insecurity:   . Worried About Charity fundraiser in the Last Year:   . Arboriculturist in the Last Year:   Transportation Needs:   . Film/video editor (Medical):   Marland Kitchen Lack of Transportation (Non-Medical):   Physical Activity:   . Days of Exercise per Week:   . Minutes of Exercise per Session:   Stress:   . Feeling of Stress :   Social Connections:   . Frequency of Communication with Friends and Family:   . Frequency of Social Gatherings with Friends and Family:   . Attends Religious Services:   . Active Member of Clubs or Organizations:   . Attends Archivist Meetings:   Marland Kitchen Marital Status:     Her Allergies Are:  Allergies  Allergen Reactions  . Codeine Itching, Rash and Hives  . Latex Hives    With the power  :   Her Current Medications Are:  Outpatient Encounter Medications as of 01/04/2020  Medication Sig  . acetaminophen (TYLENOL) 500 MG tablet Take 500 mg by mouth every 6 (six) hours as needed for mild pain or headache.  Marland Kitchen amLODipine (NORVASC) 5 MG tablet Take 5 mg by mouth at bedtime.   Marland Kitchen atorvastatin (LIPITOR) 20 MG tablet Take 1 tablet (20 mg total) by mouth daily.  . cyclobenzaprine (FLEXERIL) 10  MG tablet Take 1 tablet (10 mg total) by mouth 3 (three) times daily as needed for muscle spasms.  . DULoxetine (CYMBALTA) 60 MG capsule Take 1 capsule (60 mg total) by mouth daily.   . fluticasone (FLONASE) 50 MCG/ACT nasal spray SPRAY 2 SPRAYS INTO EACH NOSTRIL EVERY DAY (Patient taking differently: Place 2 sprays into both nostrils daily. )  . glucose blood (ONETOUCH VERIO) test strip Use as instructed  . insulin aspart (NOVOLOG FLEXPEN) 100 UNIT/ML FlexPen Inject 15 Units into the skin 3 (three) times daily with meals.  . Insulin Glargine (BASAGLAR KWIKPEN) 100 UNIT/ML Inject 0.4 mLs (40 Units total) into the skin at bedtime.  . Insulin Syringe-Needle U-100 (INSULIN SYRINGE 1CC/31GX5/16") 31G X 5/16" 1 ML MISC Inject 0.08 ( 8 Units)  into the skin 3 times daily before meals.  . Lancets (ONETOUCH DELICA PLUS [id number]) MISC 1 each by Does not apply route 3 (three) times daily. Dx E11.65, z79.4  . metFORMIN (GLUCOPHAGE) 500 MG tablet Take 1 tablet (500 mg total) by mouth 2 (two) times daily with a meal. TAKE 1 TABLET BY MOUTH TWICE DAILY FOR TWO WEEKS. THEN TAKE 2 TABLETS BY MOUTH TWICE DAILY.  . NONFORMULARY OR COMPOUNDED ITEM Swish and spit 5 mLs as needed (ulcer flare up in mouth). Magic mouthwash with lidocaine  . mesalamine (LIALDA) 1.2 g EC tablet Take 2 tablets (2.4 g total) by mouth 2 (two) times daily. (Patient taking differently: Take 4.8 g by mouth daily with breakfast. )   No facility-administered encounter medications on file as of 01/04/2020.  :  Review of Systems:  Out of a complete 14 point review of systems, all are reviewed and negative with the exception of these symptoms as listed below: Review of Systems  Neurological:       Pt has not started her autopap yet. Pt sts she has not started her auto pap yet. Pt reports she did not receive any information from the DME or the letter mailed out in January. Pt is ok with starting now but still would like to meet with the MD today.     Objective:  Neurological Exam  Physical Exam Physical Examination:   Vitals:   01/04/20 1153  BP: 120/80  Pulse: 80  Temp: 98.1 F (36.7 C)  SpO2: 95%    General  Examination: The patient is a very pleasant 59 y.o. female in no acute distress. She appears well-developed and well-nourished and well groomed.   HEENT: Normocephalic, atraumatic, pupils are equal, round and reactive to light, extraocular tracking is good without limitation to gaze excursion or nystagmus noted. She has no vertigo symptoms with sudden changes of head and neck position. Funduscopic exam is normal. no photophobia. Hearing is grossly intact. Face is symmetric with normal facial animation. Speech is clear with no dysarthria noted. There is no hypophonia. There is no lip, neck/head, jaw or voice tremor. Neck is supple with full range of passive and active motion. There are no carotid bruits on auscultation. Oropharynx exam reveals: moderate mouth dryness, adequate dental hygiene and mild airway crowding. Mallampati is class I. Tongue protrudes centrally and palate elevates symmetrically.   Chest: Clear to auscultation without wheezing, rhonchi or crackles noted.  Heart: S1+S2+0, regular and normal without murmurs, rubs or gallops noted.   Abdomen: Soft, non-tender and non-distended with normal bowel sounds appreciated on auscultation.  Extremities: There is no pitting edema in the distal lower extremities bilaterally.   Skin: Warm and dry without trophic changes  noted.   Musculoskeletal: exam reveals no obvious joint deformities, tenderness or joint swelling or erythema.   Neurologically:  Mental status: The patient is awake, alert and oriented in all 4 spheres. Her immediate and remote memory, attention, language skills and fund of knowledge are appropriate. There is no evidence of aphasia, agnosia, apraxia or anomia. Speech is clear with normal prosody and enunciation. Thought process is linear. Mood is normal and affect is blunted.   Cranial nerves II - XII are as described above under HEENT exam.  Motor exam: Normal bulk, strength and tone is noted. There is no tremor,  Romberg is negative. Fine motor skills and coordination: grossly intact.  Cerebellar testing: No dysmetria or intention tremor. There is no truncal or gait ataxia.  Sensory exam: intact to light touch in the upper and lower extremities.  Gait, station and balance: He stands easily. No veering to one side is noted. No leaning to one side is noted. Posture is age-appropriate and stance is narrow based. Gait shows normal stride length and normal pace. No problems turning are noted.     Assessment and Plan:    In summary, Beverly Ramirez is a very pleasant 59 year old female with an underlying medical history of ulcerative colitis, overweight state, hyperlipidemia, degenerative disc disease, anemia, as well as sleep disturbance, who presents for follow-up consultation of her recurrent headaches.  Recent brain MRI showed a small schwannoma and she was referred to neurosurgery.  She saw Dr. Kathyrn Sheriff.  She has had difficulty with blood sugar control and went to the emergency room recently with high blood sugar values and headache.  Sleep study testing showed mild to moderate obstructive sleep apnea and she was advised in January to start AutoPap therapy and she had agreed to starting treatment but did not actually get set up with AutoPap.  She is willing to start AutoPap therapy.  She is advised that we will request her machine through a local DME company and if she has not heard within the next 2 weeks that she should call our office so we can follow through with what the hold-up is.  She is furthermore advised to seek evaluation through ENT for ongoing issues with dizziness.  She had been referred to ENT from what I can see in her chart.  She is furthermore advised to seek evaluation with her ophthalmologist for blurry vision.  She does not recall having had a full eye examination within the last several months, maybe even a year.  She is advised to call her ophthalmologist office, especially since she has  diabetes and should get a diabetic eye examination on a regular basis.  Her exam is stable.  She is advised to follow-up in 3 months to see one of our nurse practitioners for her first AutoPap compliance.  I answered all her questions today and she was in agreement. I spent 30 minutes in total face-to-face time and in reviewing records during pre-charting, more than 50% of which was spent in counseling and coordination of care, reviewing test results, reviewing medications and treatment regimen and/or in discussing or reviewing the diagnosis of OSA, headaches, dizziness, the prognosis and treatment options. Pertinent laboratory and imaging test results that were available during this visit with the patient were reviewed by me and considered in my medical decision making (see chart for details).

## 2020-01-04 NOTE — Progress Notes (Signed)
I have sent order for auto pap to [facility]. Received confirmation that order has been received.

## 2020-01-04 NOTE — Telephone Encounter (Signed)
patiient called and put her on schedule for 02/02/2120

## 2020-01-04 NOTE — Patient Instructions (Addendum)
We will set you up at home with a so called autoPAP machine for treatment of your obstructive sleep apnea.   Please call us if you have not heard the DME company (durable medical equipment) in the next 2 weeks, please call our office.  Please follow-up in 3 months to see one of our nurse practitioners to see how your treatment of sleep apnea is going.  Please talk to your primary care physician about your dizziness and evaluation through ENT as previously recommended.  For your blurry vision I recommend a full evaluation through ophthalmology, and eye specialist physician.  Please call the office you have been to previously.  I think it is important that you get a full dilated eye examination and retina examination especially given your diabetes and difficult to control blood sugar values lately.

## 2020-01-08 ENCOUNTER — Ambulatory Visit (HOSPITAL_COMMUNITY)
Admission: EM | Admit: 2020-01-08 | Discharge: 2020-01-08 | Disposition: A | Discharge status: Home / Self Care | Payer: Medicare Other | Attending: Emergency Medicine | Admitting: Emergency Medicine

## 2020-01-08 ENCOUNTER — Encounter (HOSPITAL_COMMUNITY): Payer: Self-pay

## 2020-01-08 ENCOUNTER — Other Ambulatory Visit: Payer: Self-pay

## 2020-01-08 DIAGNOSIS — Z8249 Family history of ischemic heart disease and other diseases of the circulatory system: Secondary | ICD-10-CM | POA: Insufficient documentation

## 2020-01-08 DIAGNOSIS — E119 Type 2 diabetes mellitus without complications: Secondary | ICD-10-CM | POA: Diagnosis not present

## 2020-01-08 DIAGNOSIS — Z885 Allergy status to narcotic agent status: Secondary | ICD-10-CM | POA: Insufficient documentation

## 2020-01-08 DIAGNOSIS — J069 Acute upper respiratory infection, unspecified: Secondary | ICD-10-CM | POA: Diagnosis not present

## 2020-01-08 DIAGNOSIS — E785 Hyperlipidemia, unspecified: Secondary | ICD-10-CM | POA: Insufficient documentation

## 2020-01-08 DIAGNOSIS — Z87891 Personal history of nicotine dependence: Secondary | ICD-10-CM | POA: Diagnosis not present

## 2020-01-08 DIAGNOSIS — Z823 Family history of stroke: Secondary | ICD-10-CM | POA: Diagnosis not present

## 2020-01-08 DIAGNOSIS — Z833 Family history of diabetes mellitus: Secondary | ICD-10-CM | POA: Insufficient documentation

## 2020-01-08 DIAGNOSIS — K5190 Ulcerative colitis, unspecified, without complications: Secondary | ICD-10-CM | POA: Insufficient documentation

## 2020-01-08 DIAGNOSIS — D573 Sickle-cell trait: Secondary | ICD-10-CM | POA: Insufficient documentation

## 2020-01-08 DIAGNOSIS — Z20822 Contact with and (suspected) exposure to covid-19: Secondary | ICD-10-CM | POA: Diagnosis not present

## 2020-01-08 DIAGNOSIS — J029 Acute pharyngitis, unspecified: Secondary | ICD-10-CM

## 2020-01-08 DIAGNOSIS — G4730 Sleep apnea, unspecified: Secondary | ICD-10-CM | POA: Insufficient documentation

## 2020-01-08 DIAGNOSIS — Z832 Family history of diseases of the blood and blood-forming organs and certain disorders involving the immune mechanism: Secondary | ICD-10-CM | POA: Diagnosis not present

## 2020-01-08 DIAGNOSIS — Z91040 Latex allergy status: Secondary | ICD-10-CM | POA: Diagnosis not present

## 2020-01-08 DIAGNOSIS — Z79899 Other long term (current) drug therapy: Secondary | ICD-10-CM | POA: Insufficient documentation

## 2020-01-08 DIAGNOSIS — Z794 Long term (current) use of insulin: Secondary | ICD-10-CM | POA: Insufficient documentation

## 2020-01-08 DIAGNOSIS — Z801 Family history of malignant neoplasm of trachea, bronchus and lung: Secondary | ICD-10-CM | POA: Diagnosis not present

## 2020-01-08 LAB — POCT RAPID STREP A: Streptococcus, Group A Screen (Direct): NEGATIVE

## 2020-01-08 LAB — SARS CORONAVIRUS 2 (TAT 6-24 HRS): SARS Coronavirus 2: NEGATIVE

## 2020-01-08 NOTE — Discharge Instructions (Signed)
Sore Throat  Your rapid strep tested Negative today. COVID test pending.   Please continue Tylenol or Ibuprofen for fever and pain. May try salt water gargles, cepacol lozenges, throat spray, or OTC cold relief medicine for throat discomfort. If you also have congestion take a daily anti-histamine like Zyrtec, Claritin, and a oral decongestant to help with post nasal drip that may be irritating your throat.   Stay hydrated and drink plenty of fluids to keep your throat coated relieve irritation.

## 2020-01-08 NOTE — ED Provider Notes (Signed)
[place]    CSN: [id number] Arrival date & time: 01/08/20  1611      History   Chief Complaint Chief Complaint  Patient presents with  . Sore Throat  . Otalgia    HPI Beverly Ramirez is a 59 y.o. female history of DM type II, hyperlipidemia, ulcerative colitis presenting today for evaluation of ear pain and sore throat.  Patient notes over the past 3 days she has had pain in her ears as well as discomfort in her throat and neck.  She believes this started after she was in a hot tub and then went into cool air.  She denies any fevers chills or body aches.  Denies any cough.  Has had some mild congestion.  Denies any close sick contacts.  Using Tylenol without relief.  HPI  Past Medical History:  Diagnosis Date  . Anemia   . Carpal tunnel syndrome    bilateral  . Chronic heel pain   . Diabetes mellitus without complication ([place])   . DJD (degenerative joint disease) of cervical spine    MRI 2017  . Hyperlipidemia   . Plantar fasciitis    bilateral  . Sickle cell anemia (H[facility])    sickle cell trait  . Sleep apnea   . Tendonitis    left hand/wrist  . UC (ulcerative colitis) [facility])     Patient Active Problem List   Diagnosis Date Noted  . Elevated glucose 12/23/2019  . Diabetic ketoacidosis without coma associated with type 2 diabetes mellitus ([place]) 12/23/2019  . De Quervain's tenosynovitis, left 11/16/2018  . Bilateral carpal tunnel syndrome 04/13/2018  . Leukocytosis 10/04/2017  . Hyperlipidemia 08/04/2017  . Ulcerative colitis without complications ([place]) 42/35/3614  . Plantar fasciitis 05/28/2017    Past Surgical History:  Procedure Laterality Date  . ABDOMINAL HYSTERECTOMY    . APPENDECTOMY    . CESAREAN SECTION     x4  . COLONOSCOPY     Multiple in [place]  . ESOPHAGOGASTRODUODENOSCOPY      OB History    Gravida  4   Para  4   Term      Preterm      AB      Living  4     SAB      TAB      Ectopic      Multiple      Live Births               Home Medications    Prior to Admission medications   Medication Sig Start Date End Date Taking? Authorizing Provider  acetaminophen (TYLENOL) 500 MG tablet Take 500 mg by mouth every 6 (six) hours as needed for mild pain or headache.    [provider]  amLODipine (NORVASC) 5 MG tablet Take 5 mg by mouth at bedtime.  12/15/19   [provider]  atorvastatin (LIPITOR) 20 MG tablet Take 1 tablet (20 mg total) by mouth daily. 07/28/19   Rutherford Guys, MD  cyclobenzaprine (FLEXERIL) 10 MG tablet Take 1 tablet (10 mg total) by mouth 3 (three) times daily as needed for muscle spasms. 09/26/19   Rutherford Guys, MD  DULoxetine (CYMBALTA) 60 MG capsule Take 1 capsule (60 mg total) by mouth daily. 09/26/19   Rutherford Guys, MD  fluticasone (FLONASE) 50 MCG/ACT nasal spray SPRAY 2 SPRAYS INTO EACH NOSTRIL EVERY DAY Patient taking differently: Place 2 sprays into both nostrils daily.  12/14/19   Pamella Pert,  Lilia Argue, MD  glucose blood (ONETOUCH VERIO) test strip Use as instructed 12/16/19   Rutherford Guys, MD  insulin aspart (NOVOLOG FLEXPEN) 100 UNIT/ML FlexPen Inject 15 Units into the skin 3 (three) times daily with meals. 12/26/19   Rutherford Guys, MD  Insulin Glargine [facility] KWIKPEN) 100 UNIT/ML Inject 0.4 mLs (40 Units total) into the skin at bedtime. 12/26/19   Rutherford Guys, MD  Insulin Syringe-Needle U-100 (INSULIN SYRINGE 1CC/31GX5/16") 31G X 5/16" 1 ML MISC Inject 0.08 ( 8 Units)  into the skin 3 times daily before meals. 10/16/18   Rutherford Guys, MD  Lancets [facility] DELICA PLUS [id number]) MISC 1 each by Does not apply route 3 (three) times daily. Dx E11.65, z79.4 01/24/19   Rutherford Guys, MD  mesalamine (LIALDA) 1.2 g EC tablet Take 2 tablets (2.4 g total) by mouth 2 (two) times daily. Patient taking differently: Take 4.8 g by mouth daily with breakfast.  11/25/18 12/15/19  Gatha Mayer, MD  metFORMIN (GLUCOPHAGE) 500 MG tablet Take 1  tablet (500 mg total) by mouth 2 (two) times daily with a meal. TAKE 1 TABLET BY MOUTH TWICE DAILY FOR TWO WEEKS. THEN TAKE 2 TABLETS BY MOUTH TWICE DAILY. 12/26/19   Rutherford Guys, MD  NONFORMULARY OR COMPOUNDED ITEM Swish and spit 5 mLs as needed (ulcer flare up in mouth). Magic mouthwash with lidocaine    [provider]    Family History Family History  Problem Relation Age of Onset  . Stroke Mother   . Diabetes Mother   . Hypertension Mother   . Lung cancer Mother   . Sickle cell trait Mother   . Breast cancer Maternal Aunt   . Diabetes Maternal Aunt   . Lung cancer Maternal Aunt   . Lung cancer Maternal Aunt   . Lung cancer Maternal Aunt   . Colon cancer Cousin 25  . Sickle cell trait Father   . Sickle cell anemia Sister 67  . Lung cancer Maternal Grandmother   . Sickle cell anemia Sister   . Other Sister        accident and blood clot formed    Social History Social History   Tobacco Use  . Smoking status: Former Smoker    Types: Cigarettes    Quit date: 10/13/2006    Years since quitting: 13.2  . Smokeless tobacco: Never Used  Substance Use Topics  . Alcohol use: Yes    Comment: occasional  . Drug use: No     Allergies   Codeine and Latex   Review of Systems Review of Systems  Constitutional: Negative for activity change, appetite change, chills, fatigue and fever.  HENT: Positive for congestion, rhinorrhea and sore throat. Negative for ear pain, sinus pressure and trouble swallowing.   Eyes: Negative for discharge and redness.  Respiratory: Negative for cough, chest tightness and shortness of breath.   Cardiovascular: Negative for chest pain.  Gastrointestinal: Negative for abdominal pain, diarrhea, nausea and vomiting.  Musculoskeletal: Negative for myalgias.  Skin: Negative for rash.  Neurological: Negative for dizziness, light-headedness and headaches.     Physical Exam Triage Vital Signs ED Triage Vitals  Enc Vitals Group     BP  01/08/20 1659 (!) 147/92     Pulse Rate 01/08/20 1659 83     Resp 01/08/20 1659 17     Temp 01/08/20 1659 98.8 F (37.1 C)     Temp Source 01/08/20 1659 Oral  SpO2 01/08/20 1659 100 %     Weight --      Height --      Head Circumference --      Peak Flow --      Pain Score 01/08/20 1658 7     Pain Loc --      Pain Edu? --      Excl. in [facility]? --    No data found.  Updated Vital Signs BP (!) 147/92 (BP Location: Right Arm)   Pulse 83   Temp 98.8 F (37.1 C) (Oral)   Resp 17   SpO2 100%   Visual Acuity Right Eye Distance:   Left Eye Distance:   Bilateral Distance:    Right Eye Near:   Left Eye Near:    Bilateral Near:     Physical Exam Vitals and nursing note reviewed.  Constitutional:      Appearance: She is well-developed.     Comments: No acute distress  HENT:     Head: Normocephalic and atraumatic.     Ears:     Comments: Bilateral ears without tenderness to palpation of external auricle, tragus and mastoid, EAC's without erythema or swelling, TM's with good bony landmarks and cone of light. Non erythematous.     Nose: Nose normal.     Comments: Nasal mucosa mildly erythematous, nonswollen turbinates    Mouth/Throat:     Comments: Oral mucosa pink and moist, no tonsillar enlargement, isolated exudate noted on right tonsil, posterior pharynx patent and nonerythematous, no uvula deviation or swelling. Normal phonation. Eyes:     Conjunctiva/sclera: Conjunctivae normal.  Cardiovascular:     Rate and Rhythm: Normal rate.  Pulmonary:     Effort: Pulmonary effort is normal. No respiratory distress.     Comments: Breathing comfortably at rest, CTABL, no wheezing, rales or other adventitious sounds auscultated Abdominal:     General: There is no distension.  Musculoskeletal:        General: Normal range of motion.     Cervical back: Neck supple.  Skin:    General: Skin is warm and dry.  Neurological:     Mental Status: She is alert and oriented to person,  place, and time.      UC Treatments / Results  Labs (all labs ordered are listed, but only abnormal results are displayed) Labs Reviewed  SARS CORONAVIRUS 2 (TAT 6-24 HRS)  CULTURE, GROUP A STREP [facility])  POCT RAPID STREP A    EKG   Radiology No results found.  Procedures Procedures (including critical care time)  Medications Ordered in UC Medications - No data to display  Initial Impression / Assessment and Plan / UC Course  I have reviewed the triage vital signs and the nursing notes.  Pertinent labs & imaging results that were available during my care of the patient were reviewed by me and considered in my medical decision making (see chart for details).     Strep test negative, Covid PCR pending.  Exam unremarkable, recommend symptomatic and supportive care resting and fluids with continued close monitoring.  Discussed strict return precautions. Patient verbalized understanding and is agreeable with plan.  Final Clinical Impressions(s) / UC Diagnoses   Final diagnoses:  Sore throat  Viral URI     Discharge Instructions     Sore Throat  Your rapid strep tested Negative today. COVID test pending.   Please continue Tylenol or Ibuprofen for fever and pain. May try salt water gargles, cepacol lozenges, throat spray,  or OTC cold relief medicine for throat discomfort. If you also have congestion take a daily anti-histamine like Zyrtec, Claritin, and a oral decongestant to help with post nasal drip that may be irritating your throat.   Stay hydrated and drink plenty of fluids to keep your throat coated relieve irritation.    ED Prescriptions    None     PDMP not reviewed this encounter.   Janith Lima, [place] 01/08/20 1934

## 2020-01-08 NOTE — ED Triage Notes (Signed)
C/o ear pain and sore throat.

## 2020-01-09 ENCOUNTER — Encounter: Payer: Self-pay | Admitting: *Deleted

## 2020-01-10 LAB — CULTURE, GROUP A STREP (THRC)

## 2020-01-11 ENCOUNTER — Other Ambulatory Visit: Payer: Self-pay | Admitting: Obstetrics & Gynecology

## 2020-01-11 DIAGNOSIS — Z1231 Encounter for screening mammogram for malignant neoplasm of breast: Secondary | ICD-10-CM

## 2020-01-11 DIAGNOSIS — E119 Type 2 diabetes mellitus without complications: Secondary | ICD-10-CM | POA: Diagnosis not present

## 2020-01-12 ENCOUNTER — Telehealth: Payer: Self-pay

## 2020-01-12 ENCOUNTER — Other Ambulatory Visit: Payer: Self-pay

## 2020-01-12 DIAGNOSIS — G4733 Obstructive sleep apnea (adult) (pediatric): Secondary | ICD-10-CM

## 2020-01-12 NOTE — Telephone Encounter (Signed)
Diona Browner, RN; [place], Melissa  [place], I pulled this CPAP referral last week and got it kicked back because the Rx is from Jan 2021 and [employer] requires that Rx's for Medicare patients be within the last 90 days.   Could you ask Dr. Rexene Alberts to provide an updated Rx and let me know when it is available so that I can pull it?   Thank you.     I have placed the order requested.

## 2020-01-14 ENCOUNTER — Emergency Department (HOSPITAL_COMMUNITY): Payer: Medicare Other

## 2020-01-14 ENCOUNTER — Other Ambulatory Visit: Payer: Self-pay

## 2020-01-14 ENCOUNTER — Encounter (HOSPITAL_COMMUNITY): Payer: Self-pay | Admitting: Emergency Medicine

## 2020-01-14 ENCOUNTER — Emergency Department (HOSPITAL_COMMUNITY)
Admission: EM | Admit: 2020-01-14 | Discharge: 2020-01-14 | Disposition: A | Discharge status: Home / Self Care | Payer: Medicare Other | Attending: Emergency Medicine | Admitting: Emergency Medicine

## 2020-01-14 DIAGNOSIS — Z794 Long term (current) use of insulin: Secondary | ICD-10-CM | POA: Insufficient documentation

## 2020-01-14 DIAGNOSIS — J069 Acute upper respiratory infection, unspecified: Secondary | ICD-10-CM | POA: Diagnosis not present

## 2020-01-14 DIAGNOSIS — R0789 Other chest pain: Secondary | ICD-10-CM | POA: Diagnosis not present

## 2020-01-14 DIAGNOSIS — K7689 Other specified diseases of liver: Secondary | ICD-10-CM | POA: Diagnosis not present

## 2020-01-14 DIAGNOSIS — R7989 Other specified abnormal findings of blood chemistry: Secondary | ICD-10-CM | POA: Diagnosis not present

## 2020-01-14 DIAGNOSIS — Z87891 Personal history of nicotine dependence: Secondary | ICD-10-CM | POA: Diagnosis not present

## 2020-01-14 DIAGNOSIS — R748 Abnormal levels of other serum enzymes: Secondary | ICD-10-CM | POA: Diagnosis not present

## 2020-01-14 DIAGNOSIS — Z91040 Latex allergy status: Secondary | ICD-10-CM | POA: Diagnosis not present

## 2020-01-14 DIAGNOSIS — R079 Chest pain, unspecified: Secondary | ICD-10-CM

## 2020-01-14 DIAGNOSIS — R1084 Generalized abdominal pain: Secondary | ICD-10-CM | POA: Diagnosis not present

## 2020-01-14 DIAGNOSIS — R7401 Elevation of levels of liver transaminase levels: Secondary | ICD-10-CM | POA: Diagnosis not present

## 2020-01-14 DIAGNOSIS — E119 Type 2 diabetes mellitus without complications: Secondary | ICD-10-CM | POA: Diagnosis not present

## 2020-01-14 DIAGNOSIS — J029 Acute pharyngitis, unspecified: Secondary | ICD-10-CM | POA: Diagnosis present

## 2020-01-14 DIAGNOSIS — Z79899 Other long term (current) drug therapy: Secondary | ICD-10-CM | POA: Diagnosis not present

## 2020-01-14 LAB — CBC
HCT: 36.5 % (ref 36.0–46.0)
Hemoglobin: 11.8 g/dL — ABNORMAL LOW (ref 12.0–15.0)
MCH: 28 pg (ref 26.0–34.0)
MCHC: 32.3 g/dL (ref 30.0–36.0)
MCV: 86.7 fL (ref 80.0–100.0)
Platelets: 361 10*3/uL (ref 150–400)
RBC: 4.21 MIL/uL (ref 3.87–5.11)
RDW: 12.4 % (ref 11.5–15.5)
WBC: 14.1 10*3/uL — ABNORMAL HIGH (ref 4.0–10.5)
nRBC: 0 % (ref 0.0–0.2)

## 2020-01-14 LAB — TROPONIN I (HIGH SENSITIVITY): Troponin I (High Sensitivity): 3 ng/L (ref <18)

## 2020-01-14 LAB — BASIC METABOLIC PANEL
Anion gap: 6 (ref 5–15)
BUN: 10 mg/dL (ref 6–20)
CO2: 28 mmol/L (ref 22–32)
Calcium: 8.8 mg/dL — ABNORMAL LOW (ref 8.9–10.3)
Chloride: 104 mmol/L (ref 98–111)
Creatinine, Ser: 0.71 mg/dL (ref 0.44–1.00)
GFR calc Af Amer: >60 mL/min (ref >60)
GFR calc non Af Amer: >60 mL/min (ref >60)
Glucose, Bld: 117 mg/dL — ABNORMAL HIGH (ref 70–99)
Potassium: 3.2 mmol/L — ABNORMAL LOW (ref 3.5–5.1)
Sodium: 138 mmol/L (ref 135–145)

## 2020-01-14 LAB — HEPATIC FUNCTION PANEL
ALT: 53 U/L — ABNORMAL HIGH (ref 0–44)
AST: 26 U/L (ref 15–41)
Albumin: 3.3 g/dL — ABNORMAL LOW (ref 3.5–5.0)
Alkaline Phosphatase: 165 U/L — ABNORMAL HIGH (ref 38–126)
Bilirubin, Direct: 0.1 mg/dL (ref 0.0–0.2)
Indirect Bilirubin: 0.6 mg/dL (ref 0.3–0.9)
Total Bilirubin: 0.7 mg/dL (ref 0.3–1.2)
Total Protein: 7 g/dL (ref 6.5–8.1)

## 2020-01-14 LAB — I-STAT BETA HCG BLOOD, ED (MC, WL, AP ONLY): I-stat hCG, quantitative: 12 m[IU]/mL — ABNORMAL HIGH (ref <5)

## 2020-01-14 IMAGING — US US ABDOMEN LIMITED
1 series · 14 of 25 positions shown · non-contrast
Comparison: [DATE].

CLINICAL DATA: Elevated LFTs with abdominal pain.

EXAM:
ULTRASOUND ABDOMEN LIMITED RIGHT UPPER QUADRANT

[Series 1: us abdomen limited · 14 of 47 slices shown]
[im 1/47]
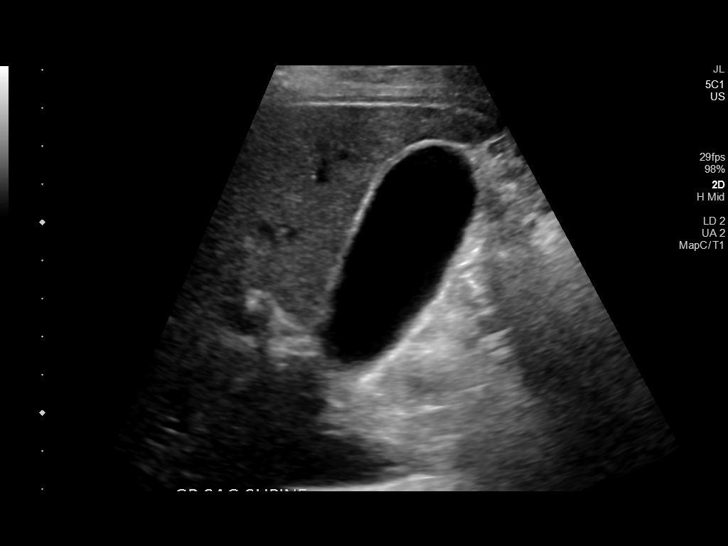
[im 4/47]
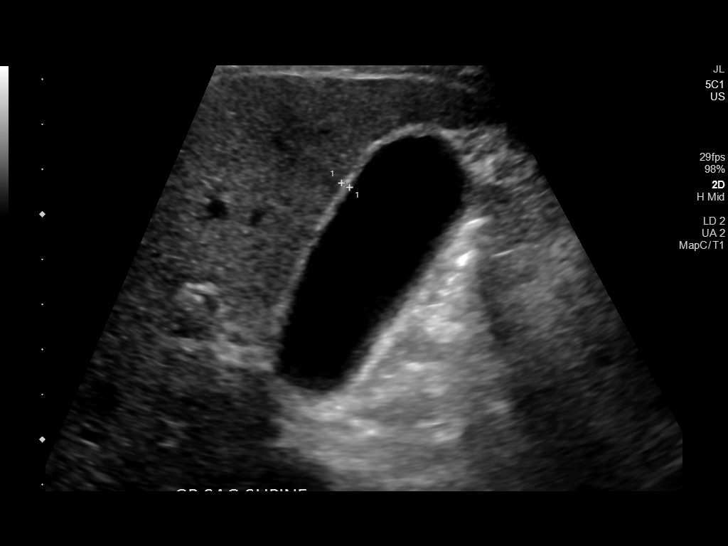
[im 8/47]
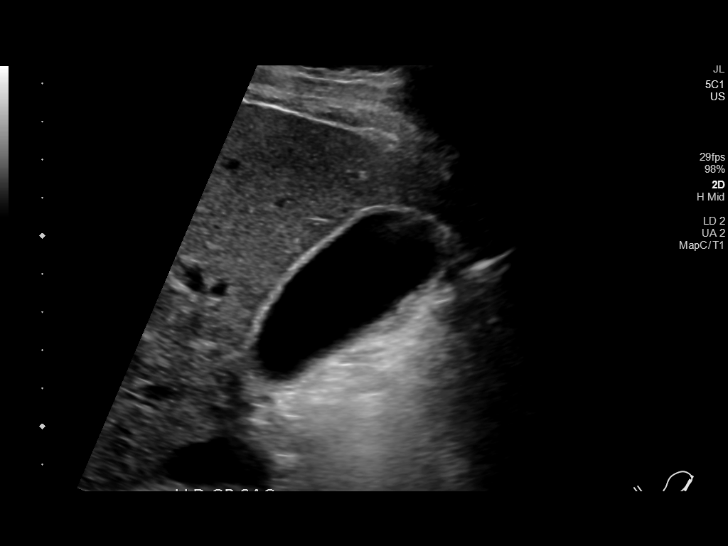
[im 12/47]
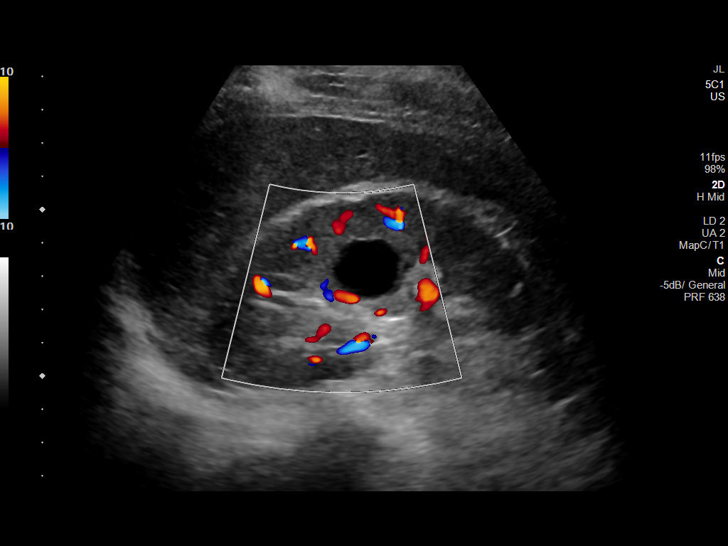
[im 16/47]
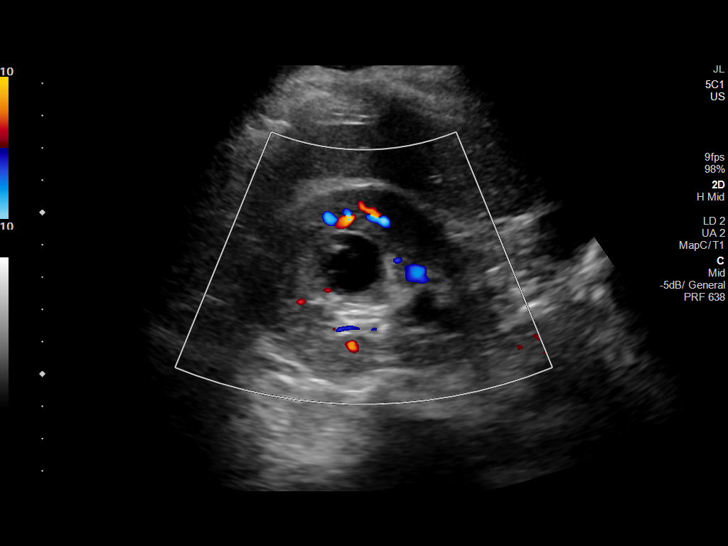
[im 18/47]
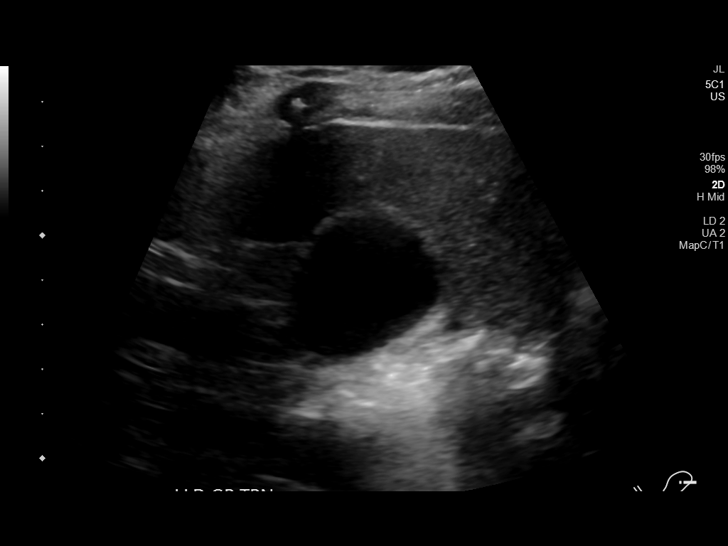
[im 22/47]
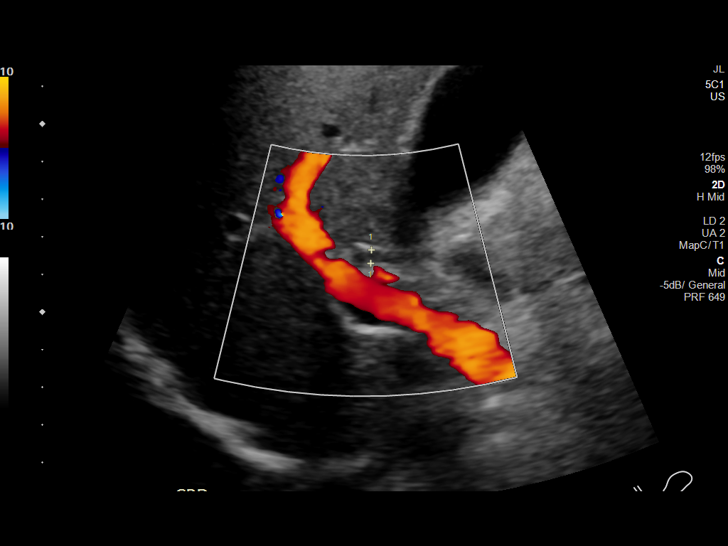
[im 25/47]
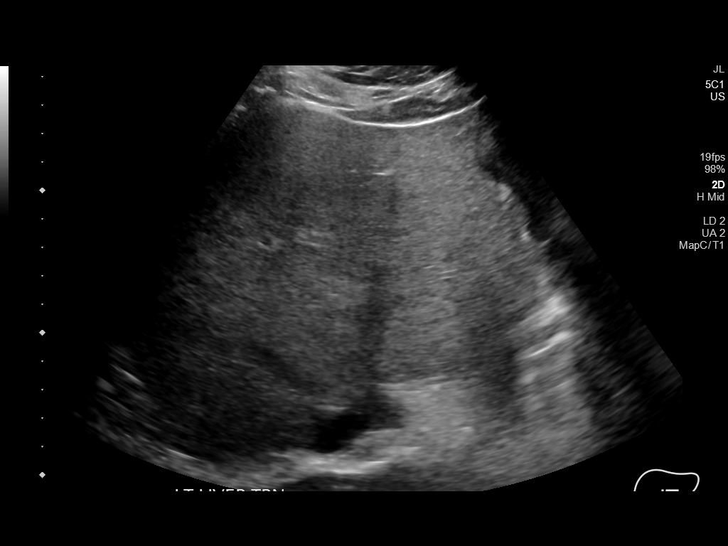
[im 29/47]
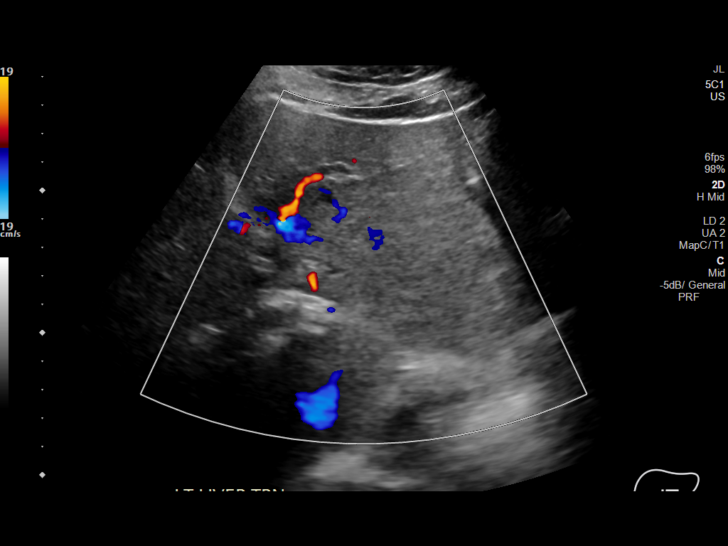
[im 31/47]
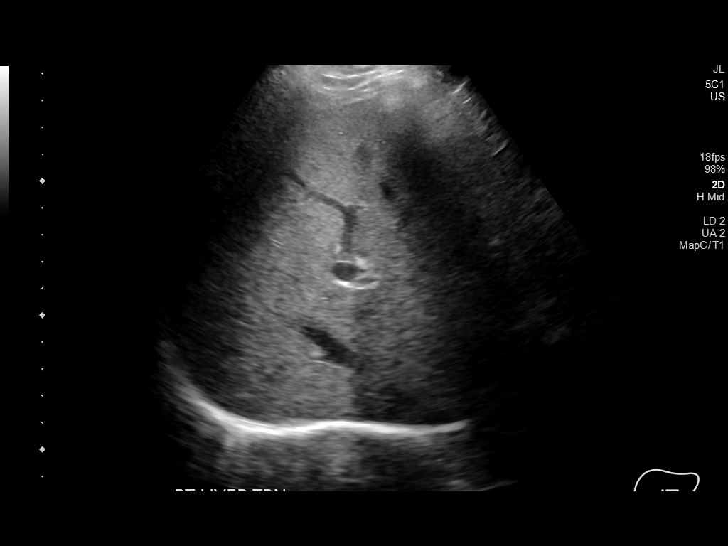
[im 35/47]
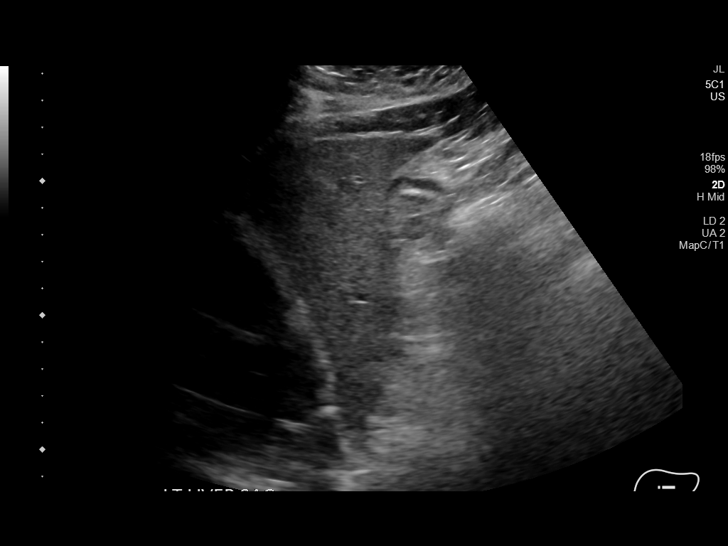
[im 39/47]
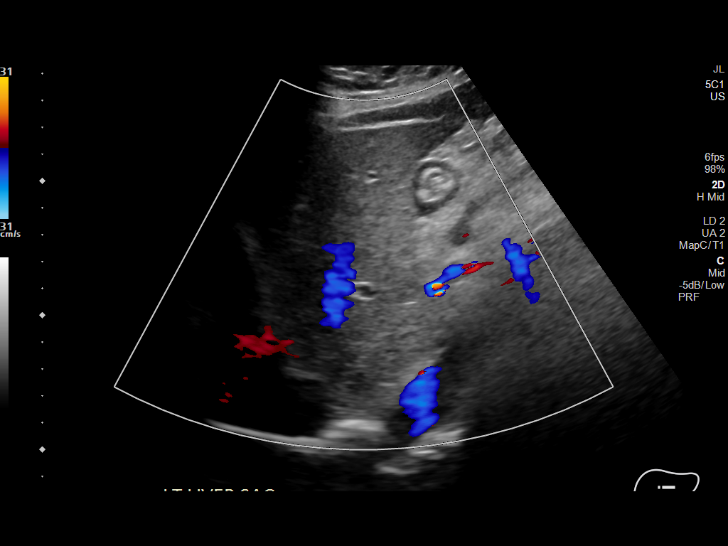
[im 43/47]
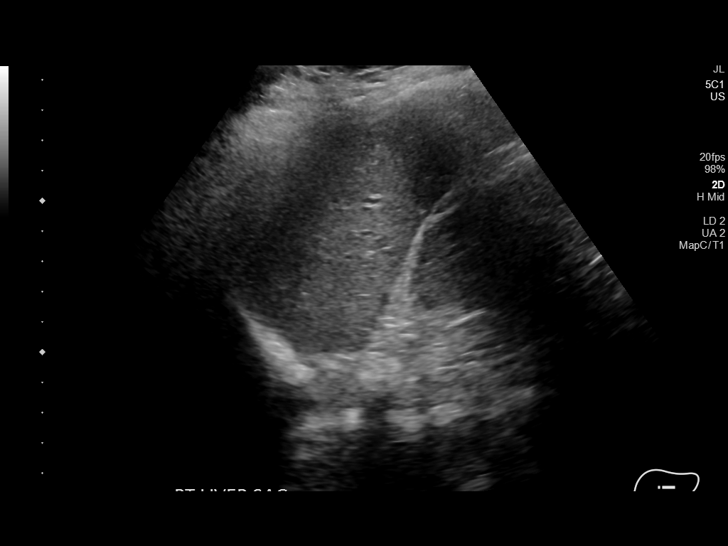
[im 47/47]
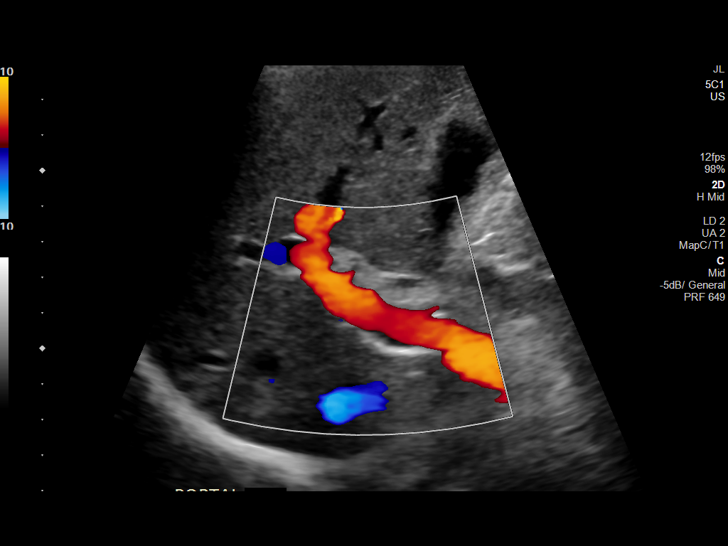

[14 of 25 positions shown; findings below may reference images not displayed]

FINDINGS: Gallbladder:

No gallstones or wall thickening visualized. No sonographic Murphy
sign noted by sonographer.

Common bile duct:

Diameter: 4 mm

Liver:

Increased echogenicity with slightly heterogeneous liver. Appearance
typically secondary to fatty infiltration. Fibrosis secondary
consideration. No secondary findings of cirrhosis noted. No focal
hepatic lesion or intrahepatic biliary duct dilatation. portal vein
is patent on color Doppler imaging with normal direction of blood
flow towards the liver.

Other: None.
IMPRESSION: 1. No acute abnormality.
2. Probable mild hepatic steatosis.

## 2020-01-14 IMAGING — CR DG CHEST 2V
2 series · 2 of 2 positions shown · non-contrast
Comparison: Chest radiograph dated [DATE]

CLINICAL DATA: Chest pain

EXAM:
CHEST - 2 VIEW

[w chest pa]
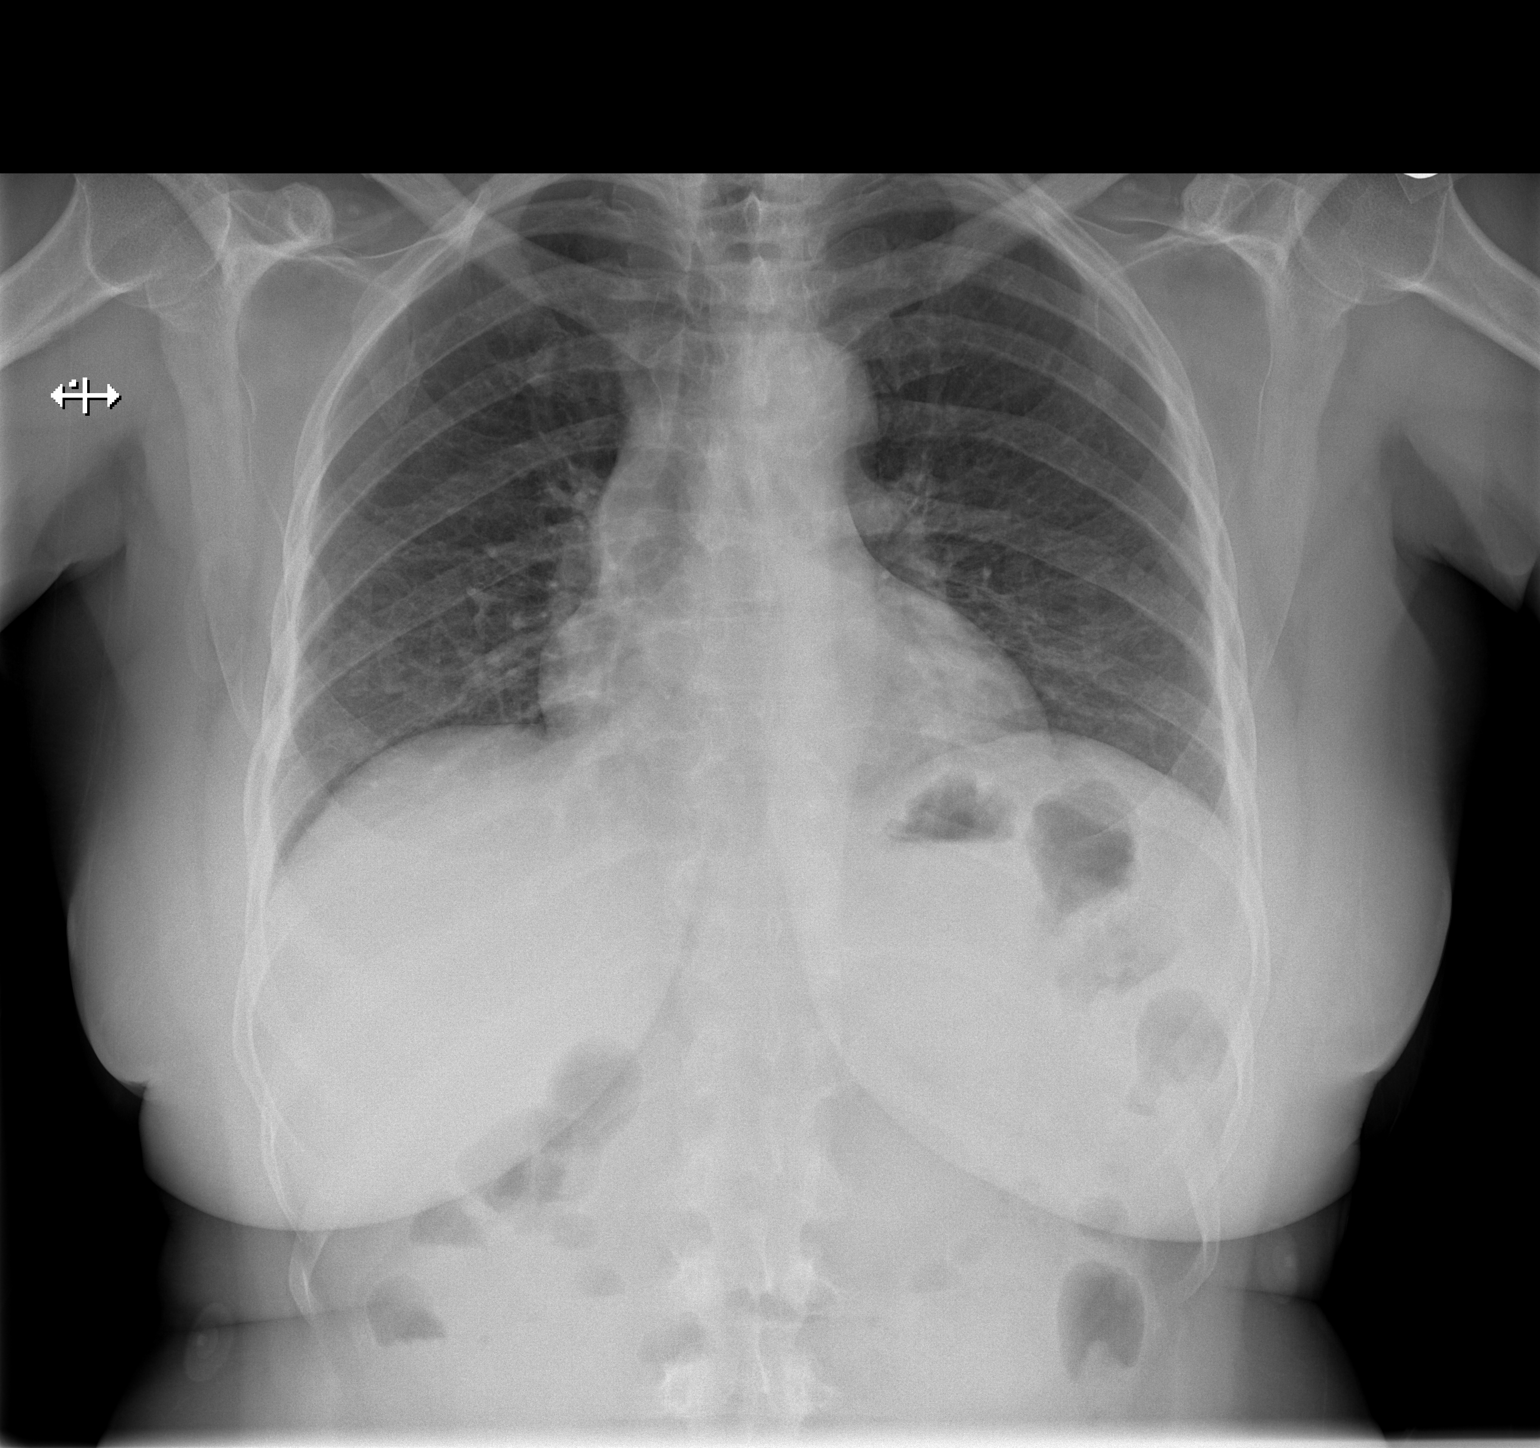

[w chest lat]
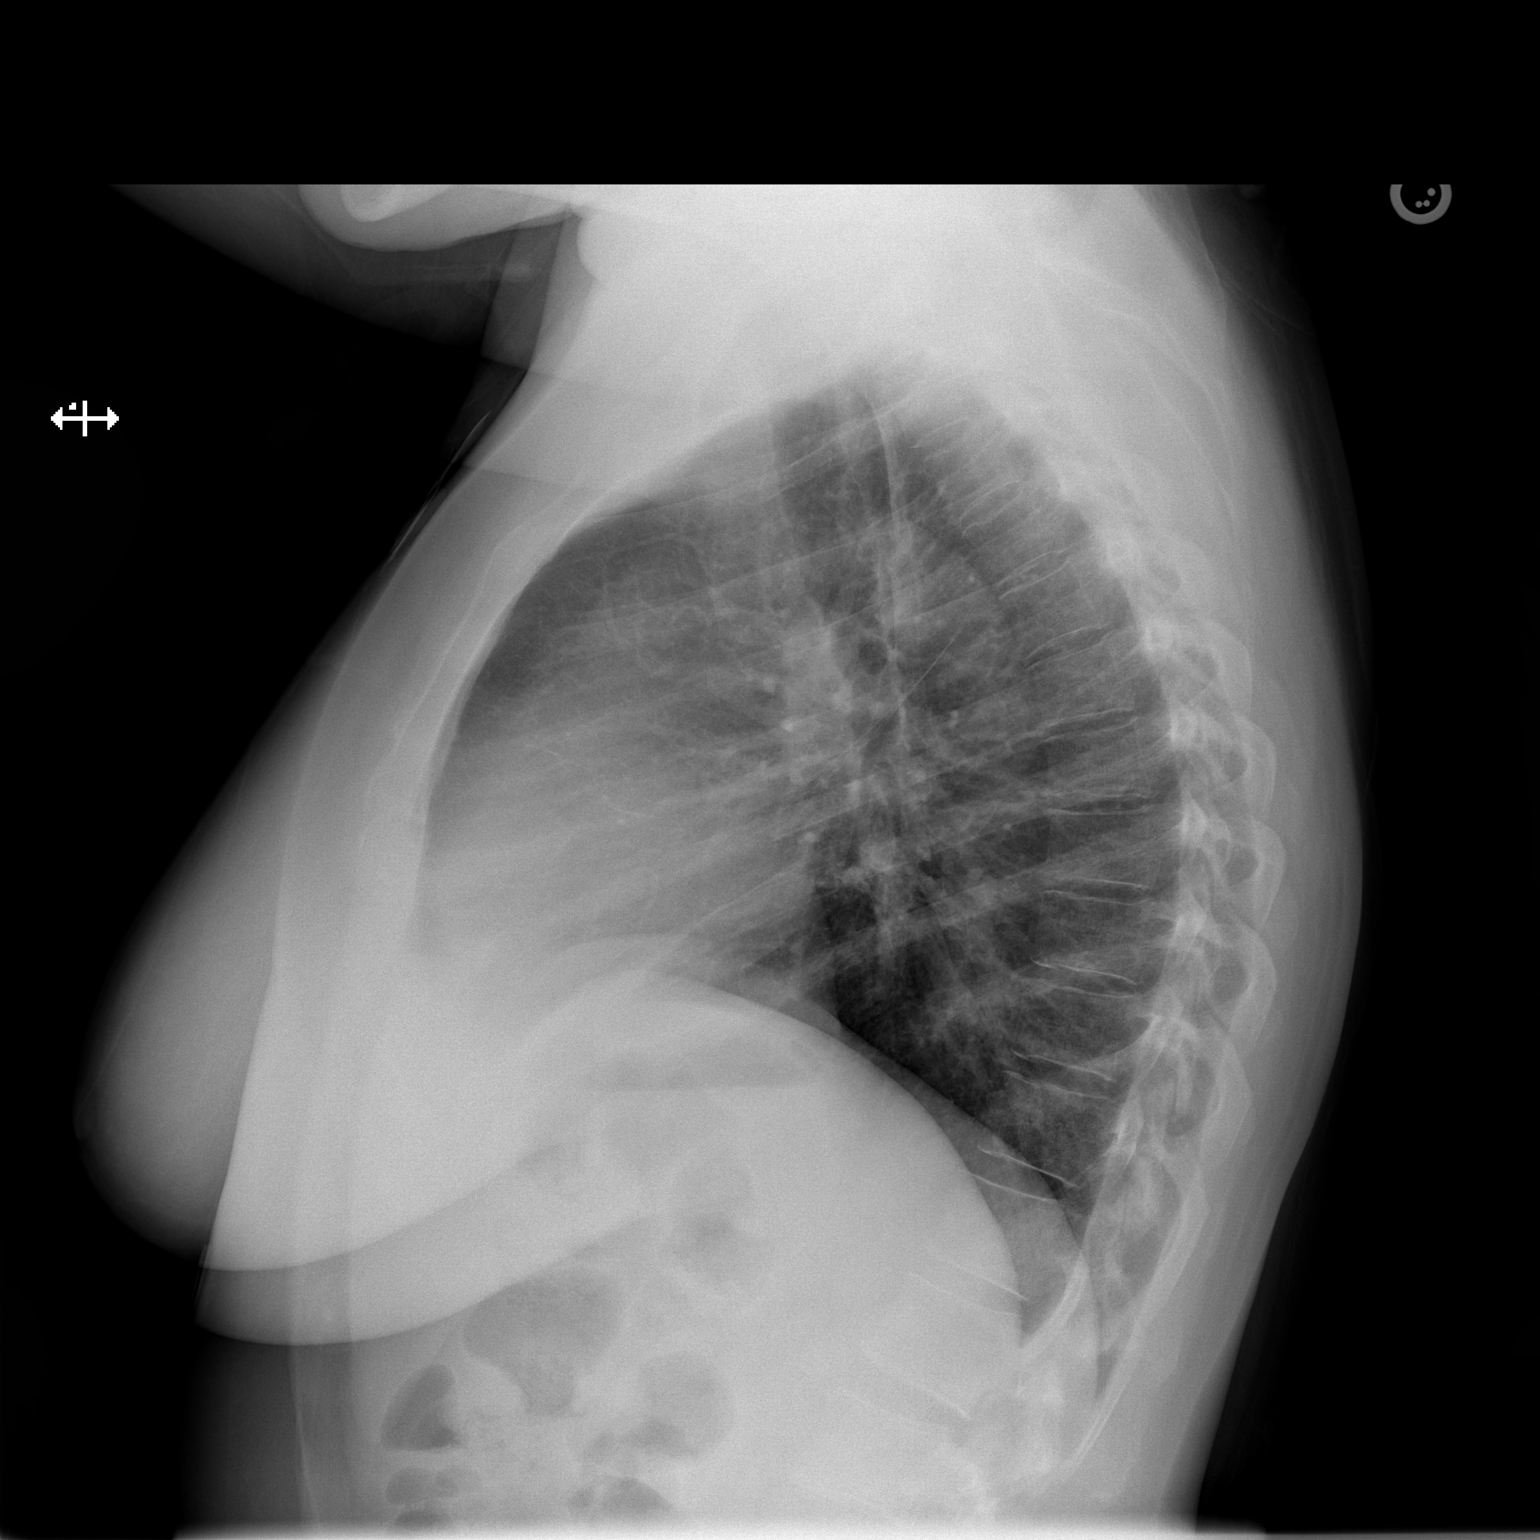

[2 of 2 positions shown; findings below may reference images not displayed]

FINDINGS: The heart size and mediastinal contours are within normal limits.
Both lungs are clear. The visualized skeletal structures are
unremarkable.
IMPRESSION: No active cardiopulmonary disease.

## 2020-01-14 MED ORDER — POTASSIUM CHLORIDE CRYS ER 20 MEQ PO TBCR
40.0000 meq | EXTENDED_RELEASE_TABLET | Freq: Once | ORAL | Status: AC
  Administered 2020-01-14: 40 meq via ORAL
  Filled 2020-01-14: qty 2

## 2020-01-14 NOTE — ED Triage Notes (Signed)
Per pt, states body aches and chest pain radiating to abdomin and then to back-symptoms for 4 days

## 2020-01-14 NOTE — Discharge Instructions (Addendum)
Per our discussion, I would recommend you continue taking Claritin and Flonase.  I would also recommend taking a cough suppressant such as Delsym.  This can be purchased over-the-counter.  If your symptoms do not alleviate or continue to worsen please return to the emergency department for reevaluation.  Please follow-up with your primary care provider regarding this visit.  Was a pleasure to meet you.

## 2020-01-14 NOTE — ED Provider Notes (Signed)
[facility] DEPT Provider Note   CSN: [id number] Arrival date & time: 01/14/20  1157     History Chief Complaint  Patient presents with  . Chest Pain    Beverly Ramirez is a 59 y.o. female.  HPI HPI Comments: Beverly Ramirez is a 59 y.o. female with significant medical history that is listed below who presents to the Emergency Department with multiple complaints.  She states for the last 4 days she has been experiencing sore throat and body aches.  She then began experiencing diffuse abdominal pain as well as diffuse chest pain.  Her abdominal pain and chest pain worsens with deep breathing and coughing.  She was seen in urgent care 6 days ago for sore throat.  She had a negative strep test as well as COVID-19 test performed at this time.  She has a history of seasonal allergies and has been taking Tylenol, Claritin, "nasal spray" without significant relief.  She additionally reports associated productive cough with yellow sputum, congestion, fever with a T-max of 102.1, chills, rhinorrhea, sinus pain.  She denies difficulty swallowing, drooling, leg swelling, vomiting, diarrhea, constipation, dysuria, hematuria, syncope.    Past Medical History:  Diagnosis Date  . Anemia   . Carpal tunnel syndrome    bilateral  . Chronic heel pain   . Diabetes mellitus without complication ([place])   . DJD (degenerative joint disease) of cervical spine    MRI 2017  . Hyperlipidemia   . Plantar fasciitis    bilateral  . Sickle cell anemia (HCC)    sickle cell trait  . Sleep apnea   . Tendonitis    left hand/wrist  . UC (ulcerative colitis) [facility])     Patient Active Problem List   Diagnosis Date Noted  . Elevated glucose 12/23/2019  . Diabetic ketoacidosis without coma associated with type 2 diabetes mellitus ([place]) 12/23/2019  . De Quervain's tenosynovitis, left 11/16/2018  . Bilateral carpal tunnel syndrome 04/13/2018  . Leukocytosis 10/04/2017  .  Hyperlipidemia 08/04/2017  . Ulcerative colitis without complications ([place]) 61/95/0932  . Plantar fasciitis 05/28/2017    Past Surgical History:  Procedure Laterality Date  . ABDOMINAL HYSTERECTOMY    . APPENDECTOMY    . CESAREAN SECTION     x4  . COLONOSCOPY     Multiple in [place]  . ESOPHAGOGASTRODUODENOSCOPY       OB History    Gravida  4   Para  4   Term      Preterm      AB      Living  4     SAB      TAB      Ectopic      Multiple      Live Births              Family History  Problem Relation Age of Onset  . Stroke Mother   . Diabetes Mother   . Hypertension Mother   . Lung cancer Mother   . Sickle cell trait Mother   . Breast cancer Maternal Aunt   . Diabetes Maternal Aunt   . Lung cancer Maternal Aunt   . Lung cancer Maternal Aunt   . Lung cancer Maternal Aunt   . Colon cancer Cousin 60  . Sickle cell trait Father   . Sickle cell anemia Sister 41  . Lung cancer Maternal Grandmother   . Sickle cell anemia Sister   . Other Sister  accident and blood clot formed    Social History   Tobacco Use  . Smoking status: Former Smoker    Types: Cigarettes    Quit date: 10/13/2006    Years since quitting: 13.2  . Smokeless tobacco: Never Used  Substance Use Topics  . Alcohol use: Yes    Comment: occasional  . Drug use: No    Home Medications Prior to Admission medications   Medication Sig Start Date End Date Taking? Authorizing Provider  acetaminophen (TYLENOL) 500 MG tablet Take 500 mg by mouth every 6 (six) hours as needed for mild pain or headache.    [provider]  amLODipine (NORVASC) 5 MG tablet Take 5 mg by mouth at bedtime.  12/15/19   [provider]  atorvastatin (LIPITOR) 20 MG tablet Take 1 tablet (20 mg total) by mouth daily. 07/28/19   Rutherford Guys, MD  cyclobenzaprine (FLEXERIL) 10 MG tablet Take 1 tablet (10 mg total) by mouth 3 (three) times daily as needed for muscle spasms. 09/26/19    Rutherford Guys, MD  DULoxetine (CYMBALTA) 60 MG capsule Take 1 capsule (60 mg total) by mouth daily. 09/26/19   Rutherford Guys, MD  fluticasone (FLONASE) 50 MCG/ACT nasal spray SPRAY 2 SPRAYS INTO EACH NOSTRIL EVERY DAY Patient taking differently: Place 2 sprays into both nostrils daily.  12/14/19   Rutherford Guys, MD  glucose blood [facility] VERIO) test strip Use as instructed 12/16/19   Rutherford Guys, MD  insulin aspart (NOVOLOG FLEXPEN) 100 UNIT/ML FlexPen Inject 15 Units into the skin 3 (three) times daily with meals. 12/26/19   Rutherford Guys, MD  Insulin Glargine [facility] KWIKPEN) 100 UNIT/ML Inject 0.4 mLs (40 Units total) into the skin at bedtime. 12/26/19   Rutherford Guys, MD  Insulin Syringe-Needle U-100 (INSULIN SYRINGE 1CC/31GX5/16") 31G X 5/16" 1 ML MISC Inject 0.08 ( 8 Units)  into the skin 3 times daily before meals. 10/16/18   Rutherford Guys, MD  Lancets [facility] DELICA PLUS [id number]) MISC 1 each by Does not apply route 3 (three) times daily. Dx E11.65, z79.4 01/24/19   Rutherford Guys, MD  mesalamine (LIALDA) 1.2 g EC tablet Take 2 tablets (2.4 g total) by mouth 2 (two) times daily. Patient taking differently: Take 4.8 g by mouth daily with breakfast.  11/25/18 12/15/19  Gatha Mayer, MD  metFORMIN (GLUCOPHAGE) 500 MG tablet Take 1 tablet (500 mg total) by mouth 2 (two) times daily with a meal. TAKE 1 TABLET BY MOUTH TWICE DAILY FOR TWO WEEKS. THEN TAKE 2 TABLETS BY MOUTH TWICE DAILY. 12/26/19   Rutherford Guys, MD  NONFORMULARY OR COMPOUNDED ITEM Swish and spit 5 mLs as needed (ulcer flare up in mouth). Magic mouthwash with lidocaine    [provider]    Allergies    Codeine and Latex  Review of Systems   Review of Systems  All other systems reviewed and are negative. Ten systems reviewed and are negative for acute change, except as noted in the HPI.   Physical Exam Updated Vital Signs BP 109/72 (BP Location: Right Arm)   Pulse 94   Temp 99.3 F (37.4  C) (Oral)   Resp 18   SpO2 97%   Physical Exam Vitals and nursing note reviewed.  Constitutional:      General: She is not in acute distress.    Appearance: Normal appearance. She is well-developed and normal weight. She is not ill-appearing, toxic-appearing or diaphoretic.  HENT:     Head: Normocephalic and atraumatic.     Right Ear: External ear normal.     Left Ear: External ear normal.     Nose: Nose normal.     Mouth/Throat:     Mouth: Mucous membranes are moist.     Pharynx: Oropharynx is clear. No oropharyngeal exudate or posterior oropharyngeal erythema.  Eyes:     Extraocular Movements: Extraocular movements intact.     Pupils: Pupils are equal, round, and reactive to light.  Cardiovascular:     Rate and Rhythm: Normal rate and regular rhythm.     Pulses: Normal pulses.          Radial pulses are 2+ on the right side and 2+ on the left side.       Dorsalis pedis pulses are 2+ on the right side and 2+ on the left side.     Heart sounds: Normal heart sounds. Heart sounds not distant. No murmur. No systolic murmur. No diastolic murmur. No friction rub. No gallop.   Pulmonary:     Effort: Pulmonary effort is normal. No respiratory distress.     Breath sounds: Normal breath sounds. No stridor. No decreased breath sounds, wheezing, rhonchi or rales.  Chest:     Chest wall: Tenderness present.     Comments: Diffuse tenderness noted along the anterior chest wall Abdominal:     General: Abdomen is flat.     Palpations: Abdomen is soft.     Tenderness: There is abdominal tenderness.     Comments: Diffuse abdominal tenderness noted with deep palpation.  No specific point of worsening tenderness.  Musculoskeletal:        General: Normal range of motion.     Cervical back: Normal range of motion and neck supple. No tenderness.     Right lower leg: No tenderness. No edema.     Left lower leg: No tenderness. No edema.     Comments: No leg swelling.  No calf pain.  Skin:     General: Skin is warm and dry.  Neurological:     General: No focal deficit present.     Mental Status: She is alert and oriented to person, place, and time.  Psychiatric:        Mood and Affect: Mood normal.        Behavior: Behavior normal.    ED Results / Procedures / Treatments   Labs (all labs ordered are listed, but only abnormal results are displayed) Labs Reviewed  BASIC METABOLIC PANEL - Abnormal; Notable for the following components:      Result Value   Potassium 3.2 (*)    Glucose, Bld 117 (*)    Calcium 8.8 (*)    All other components within normal limits  CBC - Abnormal; Notable for the following components:   WBC 14.1 (*)    Hemoglobin 11.8 (*)    All other components within normal limits  HEPATIC FUNCTION PANEL - Abnormal; Notable for the following components:   Albumin 3.3 (*)    ALT 53 (*)    Alkaline Phosphatase 165 (*)    All other components within normal limits  I-STAT BETA HCG BLOOD, ED (MC, WL, AP ONLY) - Abnormal; Notable for the following components:   I-stat hCG, quantitative 12.0 (*)    All other components within normal limits  CBC WITH DIFFERENTIAL/PLATELET  TROPONIN I (HIGH SENSITIVITY)    EKG EKG Interpretation  Date/Time:  Saturday Jan 14 2020 12:56:57 EDT  Ventricular Rate:  86 PR Interval:    QRS Duration: 74 QT Interval:  480 QTC Calculation: 575 R Axis:   3 Text Interpretation: Sinus rhythm Abnormal R-wave progression, early transition Borderline T abnormalities, anterior leads Prolonged QT interval No STEMI Confirmed by Nanda Quinton [phone]) on 01/14/2020 12:59:13 PM   Radiology DG Chest 2 View  Result Date: 01/14/2020 CLINICAL DATA:  Chest pain EXAM: CHEST - 2 VIEW COMPARISON:  Chest radiograph dated 04/26/2019 FINDINGS: The heart size and mediastinal contours are within normal limits. Both lungs are clear. The visualized skeletal structures are unremarkable. IMPRESSION: No active cardiopulmonary disease. Electronically Signed   By:  Zerita Boers M.D.   On: 01/14/2020 13:27   US Abdomen Limited RUQ  Result Date: 01/14/2020 CLINICAL DATA:  Elevated LFTs with abdominal pain. EXAM: ULTRASOUND ABDOMEN LIMITED RIGHT UPPER QUADRANT COMPARISON:  Jan 12, 2019. FINDINGS: Gallbladder: No gallstones or wall thickening visualized. No sonographic Murphy sign noted by sonographer. Common bile duct: Diameter: 4 mm Liver: Increased echogenicity with slightly heterogeneous liver. Appearance typically secondary to fatty infiltration. Fibrosis secondary consideration. No secondary findings of cirrhosis noted. No focal hepatic lesion or intrahepatic biliary duct dilatation. portal vein is patent on color Doppler imaging with normal direction of blood flow towards the liver. Other: None. IMPRESSION: 1. No acute abnormality. 2. Probable mild hepatic steatosis. Electronically Signed   By: Constance Holster M.D.   On: 01/14/2020 16:30    Procedures Procedures (including critical care time)  Medications Ordered in ED Medications  potassium chloride SA (KLOR-CON) CR tablet 40 mEq (40 mEq Oral Given 01/14/20 1628)    ED Course  I have reviewed the triage vital signs and the nursing notes.  Pertinent labs & imaging results that were available during my care of the patient were reviewed by me and considered in my medical decision making (see chart for details).    MDM Rules/Calculators/A&P                      2:23 PM patient is a pleasant 59 year old female who presents with a multitude of complaints.  Physical exam is generally reassuring.  She has diffuse anterior chest wall tenderness as well as diffuse abdominal tenderness.  Basic cardiac work-up was ordered in triage.  Labs are still pending.  Chest x-ray is negative.  ECG is reassuring.  Her vital signs are stable.  She is oxygenating well.  Her temperature is slightly elevated at 99.3 Fahrenheit.  She is not tachycardic.  Her symptoms do not seem cardiac in nature.  Her Wells criteria for PE  rule out is 0. Besides her age, her PERC criteria is also 0.  Will await results of her labs.  Will reassess.  3:53 PM mild leukocytosis of 14.1.  ALT elevated at 53 with an alk phos of 165.  Will obtain ultrasound of the right upper quadrant.  Hypokalemic at 3.2.  Will give 40 mEq of K[place]-Con.  Her i-STAT hCG was mildly elevated at 12.  Records indicate that it was elevated at 8.3 about 3 weeks ago.  Patient has a history of hysterectomy.  Troponin is negative.  Pt is actively eating while I am in the room. Will reassess.  4:42 PM ultrasound shows mild hepatic steatosis but no other acute abnormalities.  I discussed this with the patient.  We discussed her symptoms and them likely being related to a viral URI.  Her pain could likely be secondary to repetitive bouts of coughing.  I recommended continued use of Claritin as well as Flonase.  Tylenol for pain as well as ibuprofen.  Additionally recommended OTC cough medications such as Delsym.  She was recently tested for COVID-19 as well as strep.  We discussed COVID-19 testing and she declined at this time.  She was given strict return precautions.  She understands she needs to return to the emergency department with any new or worsening symptoms.  She verbalized understanding above plan.  Her questions were answered.  I recommended she follow-up with her primary care provider on Monday to discuss this visit as well as her symptoms if they do not alleviate.  Her vital signs are stable at the time of discharge.  Patient discharged to home/self care.  Condition at discharge: Stable  Note: Portions of this report may have been transcribed using voice recognition software. Every effort was made to ensure accuracy; however, inadvertent computerized transcription errors may be present.    Final Clinical Impression(s) / ED Diagnoses Final diagnoses:  Alkaline phosphatase elevation  LFT elevation  Chest pain, unspecified type  Generalized abdominal pain    Viral URI with cough   Rx / DC Orders ED Discharge Orders    None       Rayna Sexton, PA-C 01/14/20 1655    Long, Wonda Olds, MD 01/15/20 [phone]

## 2020-01-17 ENCOUNTER — Encounter: Payer: Self-pay | Admitting: Family Medicine

## 2020-01-17 ENCOUNTER — Telehealth (INDEPENDENT_AMBULATORY_CARE_PROVIDER_SITE_OTHER): Payer: Medicare Other | Admitting: Family Medicine

## 2020-01-17 VITALS — BP 131/88

## 2020-01-17 DIAGNOSIS — R091 Pleurisy: Secondary | ICD-10-CM | POA: Diagnosis not present

## 2020-01-17 DIAGNOSIS — J22 Unspecified acute lower respiratory infection: Secondary | ICD-10-CM

## 2020-01-17 MED ORDER — AZITHROMYCIN 250 MG PO TABS
250.0000 mg | ORAL_TABLET | Freq: Every day | ORAL | 0 refills | Status: DC
Start: 2020-01-17 — End: 2020-02-02

## 2020-01-17 MED ORDER — PREDNISONE 20 MG PO TABS: 20.0000 mg | ORAL_TABLET | Freq: Every day | ORAL | 0 refills | Status: DC

## 2020-01-17 NOTE — Patient Instructions (Signed)
° ° ° °  If you have lab work done today you will be contacted with your lab results within the next 2 weeks.  If you have not heard from us then please contact us. The fastest way to get your results is to register for My Chart. ° ° °IF you received an x-ray today, you will receive an invoice from [facility]. Please contact [facility] at [phone] with questions or concerns regarding your invoice.  ° °IF you received labwork today, you will receive an invoice from [facility]. Please contact [facility] at [phone] with questions or concerns regarding your invoice.  ° °Our billing staff will not be able to assist you with questions regarding bills from these companies. ° °You will be contacted with the lab results as soon as they are available. The fastest way to get your results is to activate your My Chart account. Instructions are located on the last page of this paperwork. If you have not heard from us regarding the results in 2 weeks, please contact this office. °  ° ° ° °

## 2020-01-17 NOTE — Progress Notes (Signed)
Virtual Visit Note  I connected with patient on 01/17/20 at 618pm by video doximity and verified that I am speaking with the correct person using two identifiers. Beverly Ramirez is currently located at home and patient is currently with them during visit. The provider, Rutherford Guys, MD is located in their office at time of visit.  I discussed the limitations, risks, security and privacy concerns of performing an evaluation and management service by telephone and the availability of in person appointments. I also discussed with the patient that there may be a patient responsible charge related to this service. The patient expressed understanding and agreed to proceed.   I provided 16 minutes of non-face-to-face time during this encounter.  Chief Complaint  Patient presents with  . Chest Pain    when pt coughs she gets pain in her chest and extends down her Rt side. pt states this started 8 days ago, she did go to ED on the 8th but they said to contact you they believe it is URI but did not treat for this, pt notes her face is a little sore and used flonase and claratin for nasal congestion     HPI ? ER note from may 2nd and 8th for cough with right sided back pain, fever Neg strep and covid test EKG no acute changes WBC 14.1 K 3.2 - repleted CXR- no acute changes RUQ [place] - fatty liver  She is not feeling well She continues to have cough with intermittent fevers Tm 102.3 Friday, Sunday 101.3, no fever today She is having right sided chest pain with deep breathing She started taking flonase and claritin  Coughing mildly productive  No SOB  She had 3 pills azithromycin that she had left for last 2 days.  Took 2 pills on Sunday and 1 yesterday  cbgs have been "not too bad"  2 days ago 106, 97, 184, 264 Yesterday 99, 134, 127, 118 Reports normal eye exam  Allergies  Allergen Reactions  . Codeine Itching, Rash and Hives  . Latex Hives    With the power    Prior to  Admission medications   Medication Sig Start Date End Date Taking? Authorizing Provider  acetaminophen (TYLENOL) 500 MG tablet Take 500 mg by mouth every 6 (six) hours as needed for mild pain or headache.   Yes [provider]  amLODipine (NORVASC) 5 MG tablet Take 5 mg by mouth at bedtime.  12/15/19  Yes [provider]  atorvastatin (LIPITOR) 20 MG tablet Take 1 tablet (20 mg total) by mouth daily. 07/28/19  Yes Rutherford Guys, MD  cyclobenzaprine (FLEXERIL) 10 MG tablet Take 1 tablet (10 mg total) by mouth 3 (three) times daily as needed for muscle spasms. 09/26/19  Yes Rutherford Guys, MD  DULoxetine (CYMBALTA) 60 MG capsule Take 1 capsule (60 mg total) by mouth daily. 09/26/19  Yes Rutherford Guys, MD  fluticasone (FLONASE) 50 MCG/ACT nasal spray SPRAY 2 SPRAYS INTO EACH NOSTRIL EVERY DAY Patient taking differently: Place 2 sprays into both nostrils daily.  12/14/19  Yes Rutherford Guys, MD  glucose blood [facility] VERIO) test strip Use as instructed 12/16/19  Yes Rutherford Guys, MD  insulin aspart (NOVOLOG FLEXPEN) 100 UNIT/ML FlexPen Inject 15 Units into the skin 3 (three) times daily with meals. 12/26/19  Yes Rutherford Guys, MD  Insulin Glargine [facility]) 100 UNIT/ML Inject 0.4 mLs (40 Units total) into the skin at bedtime. 12/26/19  Yes Pamella Pert, Benay Spice  M, MD  Insulin Syringe-Needle U-100 (INSULIN SYRINGE 1CC/31GX5/16") 31G X 5/16" 1 ML MISC Inject 0.08 ( 8 Units)  into the skin 3 times daily before meals. 10/16/18  Yes Rutherford Guys, MD  Lancets (ONETOUCH DELICA PLUS [id number]) [place] 1 each by Does not apply route 3 (three) times daily. Dx E11.65, z79.4 01/24/19  Yes Rutherford Guys, MD  metFORMIN (GLUCOPHAGE) 500 MG tablet Take 1 tablet (500 mg total) by mouth 2 (two) times daily with a meal. TAKE 1 TABLET BY MOUTH TWICE DAILY FOR TWO WEEKS. THEN TAKE 2 TABLETS BY MOUTH TWICE DAILY. 12/26/19  Yes Rutherford Guys, MD  NONFORMULARY OR COMPOUNDED ITEM Swish and spit 5  mLs as needed (ulcer flare up in mouth). Magic mouthwash with lidocaine   Yes [provider]  mesalamine (LIALDA) 1.2 g EC tablet Take 2 tablets (2.4 g total) by mouth 2 (two) times daily. Patient taking differently: Take 4.8 g by mouth daily with breakfast.  11/25/18 12/15/19  Gatha Mayer, MD    Past Medical History:  Diagnosis Date  . Anemia   . Carpal tunnel syndrome    bilateral  . Chronic heel pain   . Diabetes mellitus without complication ([place])   . DJD (degenerative joint disease) of cervical spine    MRI 2017  . Hyperlipidemia   . Plantar fasciitis    bilateral  . Sickle cell anemia (HCC)    sickle cell trait  . Sleep apnea   . Tendonitis    left hand/wrist  . UC (ulcerative colitis) ([place])     Past Surgical History:  Procedure Laterality Date  . ABDOMINAL HYSTERECTOMY    . APPENDECTOMY    . CESAREAN SECTION     x4  . COLONOSCOPY     Multiple in [place]  . ESOPHAGOGASTRODUODENOSCOPY      Social History   Tobacco Use  . Smoking status: Former Smoker    Types: Cigarettes    Quit date: 10/13/2006    Years since quitting: 13.2  . Smokeless tobacco: Never Used  Substance Use Topics  . Alcohol use: Yes    Comment: occasional    Family History  Problem Relation Age of Onset  . Stroke Mother   . Diabetes Mother   . Hypertension Mother   . Lung cancer Mother   . Sickle cell trait Mother   . Breast cancer Maternal Aunt   . Diabetes Maternal Aunt   . Lung cancer Maternal Aunt   . Lung cancer Maternal Aunt   . Lung cancer Maternal Aunt   . Colon cancer Cousin 15  . Sickle cell trait Father   . Sickle cell anemia Sister 80  . Lung cancer Maternal Grandmother   . Sickle cell anemia Sister   . Other Sister        accident and blood clot formed    ROS Per hpi  Objective  Vitals as reported by the patient: per above  GEN: AAOx3, NAD HEENT: [place]/AT, pupils are symmetrical, EOMI, non-icteric sclera Resp: breathing comfortably, speaking in  full sentences Skin: no rashes noted, no pallor Psych: good eye contact, normal mood and affect   ASSESSMENT and PLAN  1. Lower respiratory tract infection Favoring CAP given fever, cough and elevated WBC, complete 5 day course of azithromycin, low dose pred for pleurisy. Discussed r/se/b. Reviewed RTC precautions  2. Pleurisy  Other orders - azithromycin (ZITHROMAX) 250 MG tablet; Take 1 tablet (250 mg total) by mouth daily. -  predniSONE (DELTASONE) 20 MG tablet; Take 1 tablet (20 mg total) by mouth daily with breakfast.  FOLLOW-UP: as scheduled   The above assessment and management plan was discussed with the patient. The patient verbalized understanding of and has agreed to the management plan. Patient is aware to call the clinic if symptoms persist or worsen. Patient is aware when to return to the clinic for a follow-up visit. Patient educated on when it is appropriate to go to the emergency department.     Rutherford Guys, MD Primary Care at [address], [place] [address] Ph.  [phone] Fax [phone]

## 2020-01-18 ENCOUNTER — Ambulatory Visit
Admission: RE | Admit: 2020-01-18 | Discharge: 2020-01-18 | Disposition: A | Discharge status: Home / Self Care | Payer: Medicare Other | Source: Ambulatory Visit | Attending: Obstetrics & Gynecology | Admitting: Obstetrics & Gynecology

## 2020-01-18 ENCOUNTER — Other Ambulatory Visit: Payer: Self-pay

## 2020-01-18 DIAGNOSIS — Z1231 Encounter for screening mammogram for malignant neoplasm of breast: Secondary | ICD-10-CM

## 2020-01-18 IMAGING — MG DIGITAL SCREENING BILAT W/ TOMO W/ CAD
8 series · 8 of 24 positions shown · non-contrast
Comparison: Previous exam(s).

CLINICAL DATA: Screening.

EXAM:
DIGITAL SCREENING BILATERAL MAMMOGRAM WITH TOMO AND CAD

[R MLO synth-2D]
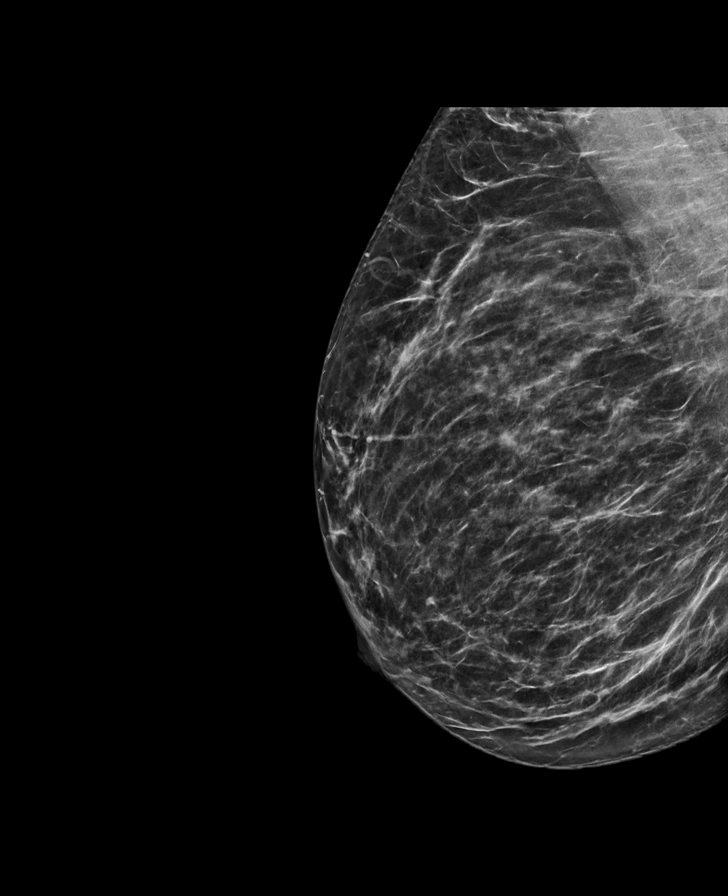

[L CC synth-2D]
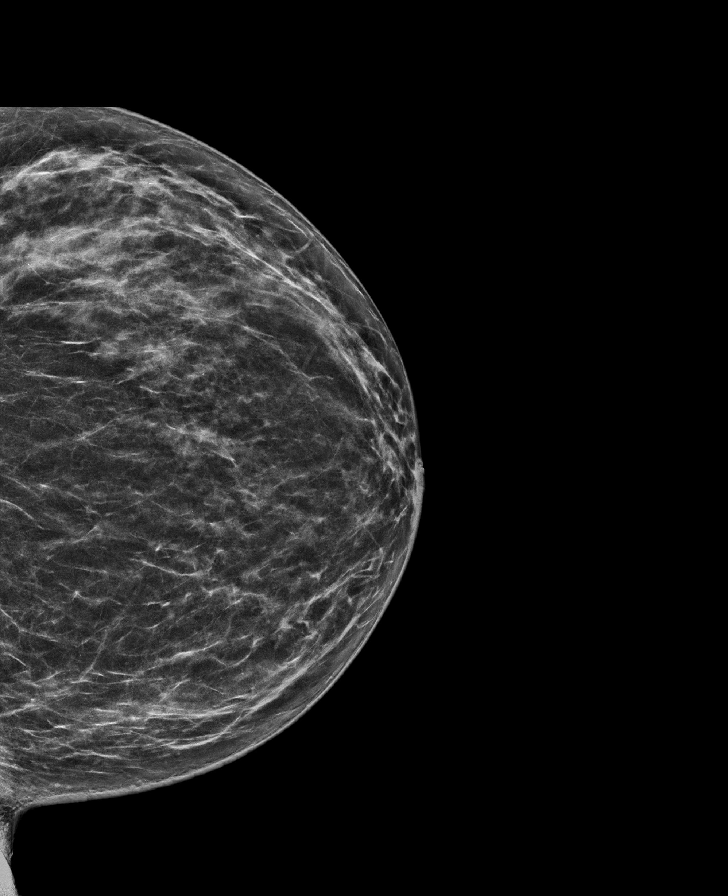

[R CC synth-2D]
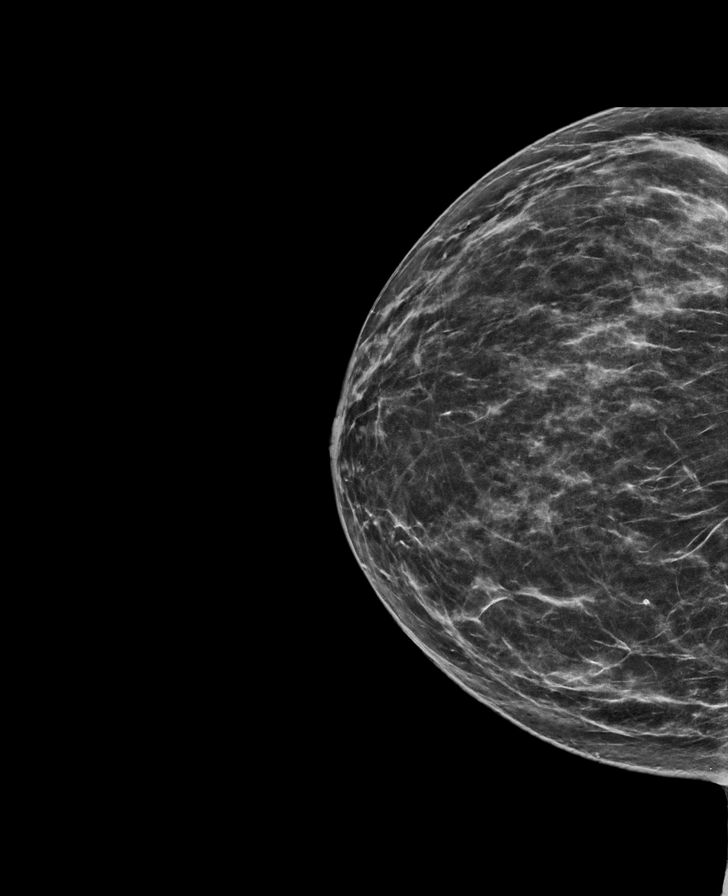

[L MLO synth-2D]
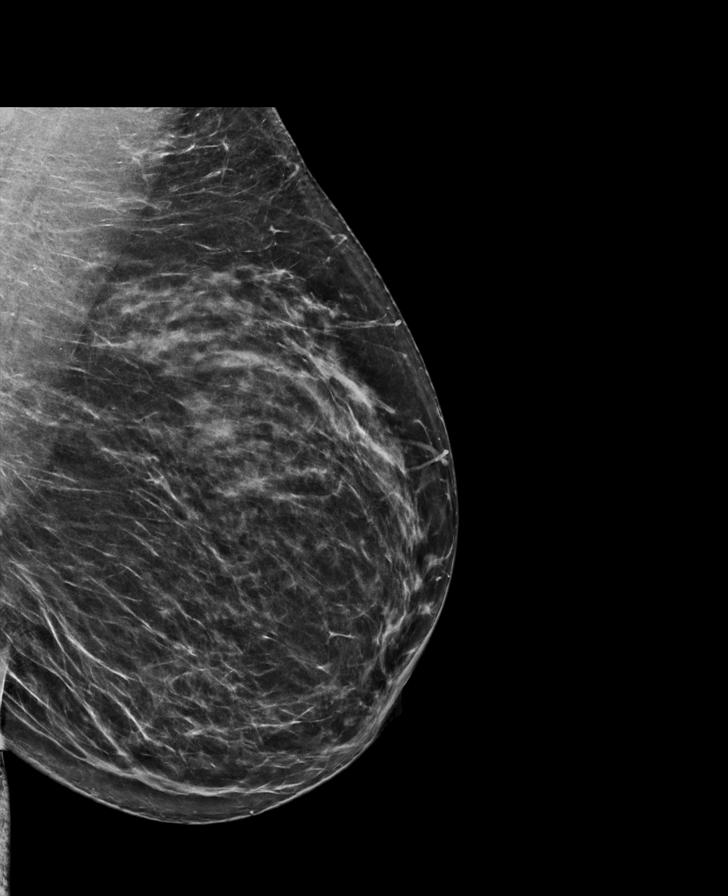

[R MLO tomo · tomo slice 35/70.0]
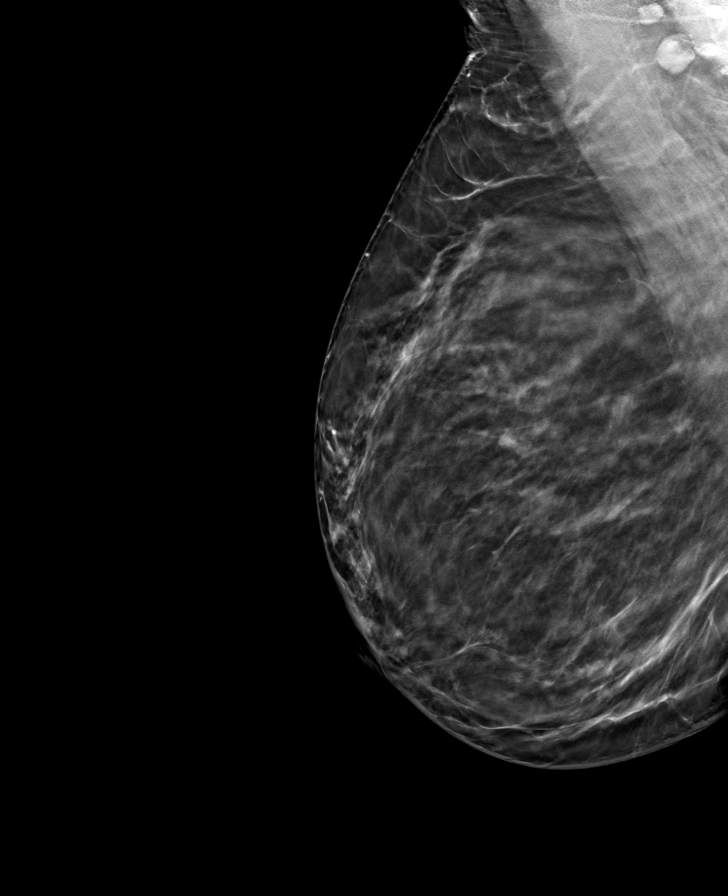

[R CC tomo · tomo slice 33/64.0]
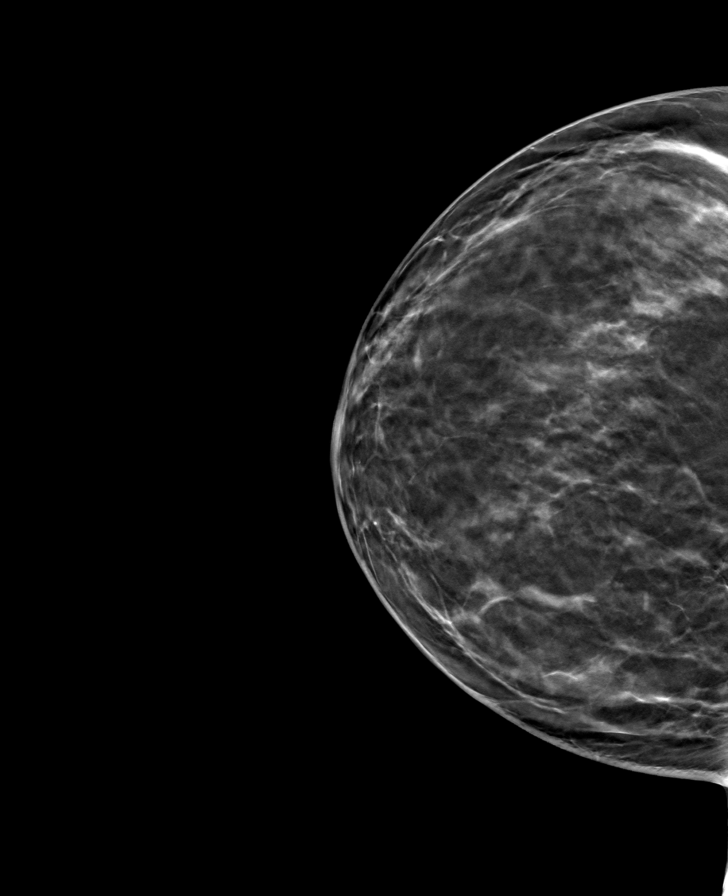

[L CC tomo · tomo slice 34/67.0]
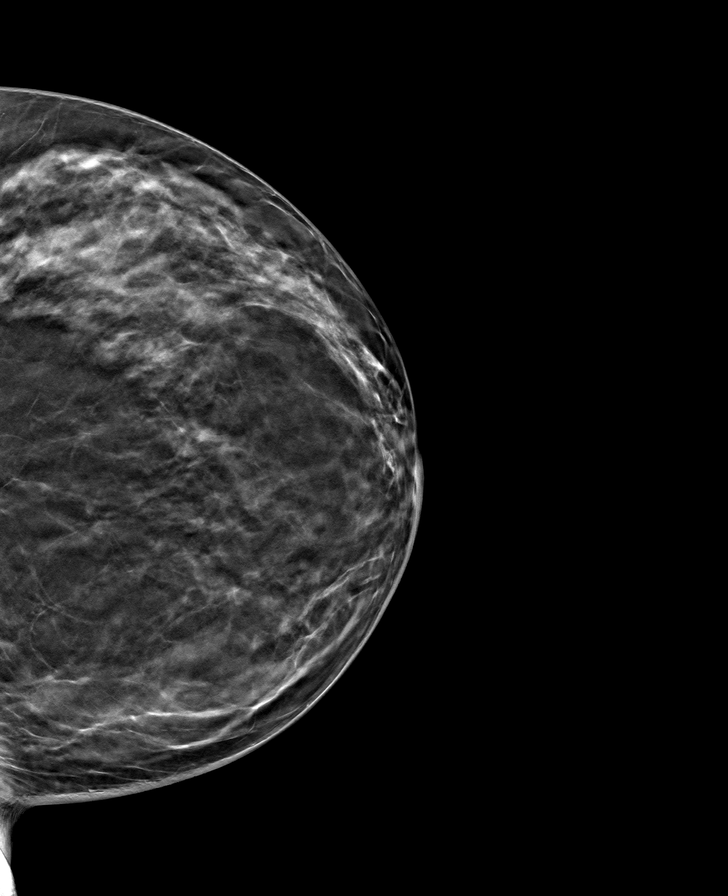

[L MLO tomo · tomo slice 37/73.0]
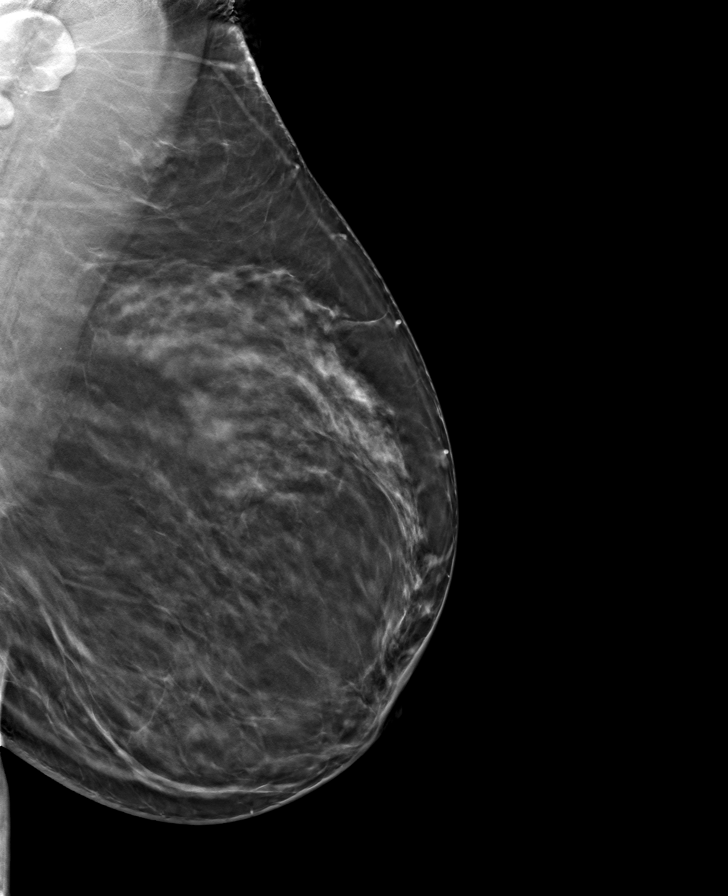

[8 of 24 positions shown; findings below may reference images not displayed]

ACR Breast Density Category c: The breast tissue is heterogeneously
dense, which may obscure small masses.
FINDINGS: There are no findings suspicious for malignancy. Images were
processed with CAD.
IMPRESSION: No mammographic evidence of malignancy. A result letter of this
screening mammogram will be mailed directly to the patient.

RECOMMENDATION:
Screening mammogram in one year. (Code:[5V])

BI-RADS CATEGORY  1: Negative.

## 2020-01-20 ENCOUNTER — Ambulatory Visit: Payer: Medicare Other | Admitting: Family Medicine

## 2020-02-02 ENCOUNTER — Encounter: Payer: Self-pay | Admitting: Family Medicine

## 2020-02-02 ENCOUNTER — Other Ambulatory Visit: Payer: Self-pay

## 2020-02-02 ENCOUNTER — Ambulatory Visit (INDEPENDENT_AMBULATORY_CARE_PROVIDER_SITE_OTHER): Payer: Medicare Other | Admitting: Family Medicine

## 2020-02-02 VITALS — BP 121/83 | HR 82 | Temp 98.0°F | Ht 62.0 in | Wt 154.0 lb

## 2020-02-02 DIAGNOSIS — J302 Other seasonal allergic rhinitis: Secondary | ICD-10-CM | POA: Diagnosis not present

## 2020-02-02 DIAGNOSIS — Z794 Long term (current) use of insulin: Secondary | ICD-10-CM

## 2020-02-02 DIAGNOSIS — E1165 Type 2 diabetes mellitus with hyperglycemia: Secondary | ICD-10-CM

## 2020-02-02 DIAGNOSIS — E1169 Type 2 diabetes mellitus with other specified complication: Secondary | ICD-10-CM

## 2020-02-02 DIAGNOSIS — E785 Hyperlipidemia, unspecified: Secondary | ICD-10-CM

## 2020-02-02 LAB — LIPID PANEL
Chol/HDL Ratio: 6.3 ratio — ABNORMAL HIGH (ref 0.0–4.4)
Cholesterol, Total: 250 mg/dL — ABNORMAL HIGH (ref 100–199)
HDL: 40 mg/dL (ref >39)
LDL Chol Calc (NIH): 179 mg/dL — ABNORMAL HIGH (ref 0–99)
Triglycerides: 167 mg/dL — ABNORMAL HIGH (ref 0–149)
VLDL Cholesterol Cal: 31 mg/dL (ref 5–40)

## 2020-02-02 LAB — HEMOGLOBIN A1C
Est. average glucose Bld gHb Est-mCnc: 220 mg/dL
Hgb A1c MFr Bld: 9.3 % — ABNORMAL HIGH (ref 4.8–5.6)

## 2020-02-02 MED ORDER — OLOPATADINE HCL 0.2 % OP SOLN: 1.0000 [drp] | Freq: Every day | OPHTHALMIC | 11 refills | Status: DC

## 2020-02-02 NOTE — Progress Notes (Signed)
5/27/202110:45 AM  Beverly Ramirez [birth year], 59 y.o., female [id number]  Chief Complaint  Patient presents with  . sinus congestion    x 4 days  . Sore Throat    ear pain x days  . itchy cough    green phlem at times     HPI:   Patient is a 59 y.o. female with past medical history significant for ulcerative colitis, HLP, HTN, DM2, fibromyalgia who presents today for routine followup  Last OV telemedicine - 2 weeks ago, treated empirically for CAP and pleurisy  She is overall doing well except for her allergies She has recovered from CAP well, fevers resolve, minimal cough, pleuritic chest pain resolved She is doing flonase and claritin wo much resolution cbgs doing well, not doing metformin but doing insulin basaglar 45 units - fasting cbgs 130s She is also doing novolog 25units AC TID Has had 2 low readings before lunch 60-70s, symptomatic She had been trying to eat less carbs during breakfast   Lab Results  Component Value Date   HGBA1C 7.1 (H) 07/25/2019   HGBA1C 7.6 (H) 03/25/2019   HGBA1C 9.5 (A) 11/10/2018   Lab Results  Component Value Date   LDLCALC 139 (H) 07/25/2019   CREATININE 0.71 01/14/2020    Depression screen PHQ 2/9 02/02/2020 01/17/2020 01/03/2020  Decreased Interest 0 0 0  Down, Depressed, Hopeless 0 0 0  PHQ - 2 Score 0 0 0    Fall Risk  02/02/2020 01/17/2020 01/03/2020 12/26/2019 12/23/2019  Falls in the past year? 0 0 0 0 0  Number falls in past yr: 0 - - 0 0  Injury with Fall? 0 - - 0 0  Follow up Falls evaluation completed Falls evaluation completed Falls evaluation completed Falls evaluation completed Falls evaluation completed     Allergies  Allergen Reactions  . Codeine Itching, Rash and Hives  . Latex Hives    With the power    Prior to Admission medications   Medication Sig Start Date End Date Taking? Authorizing Provider  acetaminophen (TYLENOL) 500 MG tablet Take 500 mg by mouth every 6 (six) hours as needed for mild  pain or headache.   Yes [provider]  amLODipine (NORVASC) 5 MG tablet Take 5 mg by mouth at bedtime.  12/15/19  Yes [provider]  atorvastatin (LIPITOR) 20 MG tablet Take 1 tablet (20 mg total) by mouth daily. 07/28/19  Yes Rutherford Guys, MD  cyclobenzaprine (FLEXERIL) 10 MG tablet Take 1 tablet (10 mg total) by mouth 3 (three) times daily as needed for muscle spasms. 09/26/19  Yes Rutherford Guys, MD  DULoxetine (CYMBALTA) 60 MG capsule Take 1 capsule (60 mg total) by mouth daily. 09/26/19  Yes Rutherford Guys, MD  fluticasone (FLONASE) 50 MCG/ACT nasal spray SPRAY 2 SPRAYS INTO EACH NOSTRIL EVERY DAY Patient taking differently: Place 2 sprays into both nostrils daily.  12/14/19  Yes Rutherford Guys, MD  insulin aspart (NOVOLOG FLEXPEN) 100 UNIT/ML FlexPen Inject 15 Units into the skin 3 (three) times daily with meals. 12/26/19  Yes Rutherford Guys, MD  Insulin Glargine [facility]) 100 UNIT/ML Inject 0.4 mLs (40 Units total) into the skin at bedtime. 12/26/19  Yes Rutherford Guys, MD  mesalamine (LIALDA) 1.2 g EC tablet Take 2 tablets (2.4 g total) by mouth 2 (two) times daily. Patient taking differently: Take 4.8 g by mouth daily with breakfast.  11/25/18 02/02/20 Yes Gatha Mayer, MD  metFORMIN (GLUCOPHAGE) 500  MG tablet Take 1 tablet (500 mg total) by mouth 2 (two) times daily with a meal. TAKE 1 TABLET BY MOUTH TWICE DAILY FOR TWO WEEKS. THEN TAKE 2 TABLETS BY MOUTH TWICE DAILY. 12/26/19  Yes Rutherford Guys, MD  NONFORMULARY OR COMPOUNDED ITEM Swish and spit 5 mLs as needed (ulcer flare up in mouth). Magic mouthwash with lidocaine   Yes [provider]  azithromycin (ZITHROMAX) 250 MG tablet Take 1 tablet (250 mg total) by mouth daily. Patient not taking: Reported on 02/02/2020 01/17/20   Rutherford Guys, MD  glucose blood [facility] VERIO) test strip Use as instructed 12/16/19   Rutherford Guys, MD  Insulin Syringe-Needle U-100 (INSULIN SYRINGE  1CC/31GX5/16") 31G X 5/16" 1 ML MISC Inject 0.08 ( 8 Units)  into the skin 3 times daily before meals. 10/16/18   Rutherford Guys, MD  Lancets [facility] DELICA PLUS [id number]) MISC 1 each by Does not apply route 3 (three) times daily. Dx E11.65, z79.4 01/24/19   Rutherford Guys, MD  predniSONE (DELTASONE) 20 MG tablet Take 1 tablet (20 mg total) by mouth daily with breakfast. Patient not taking: Reported on 02/02/2020 01/17/20   Rutherford Guys, MD    Past Medical History:  Diagnosis Date  . Anemia   . Carpal tunnel syndrome    bilateral  . Chronic heel pain   . Diabetes mellitus without complication ([place])   . DJD (degenerative joint disease) of cervical spine    MRI 2017  . Hyperlipidemia   . Plantar fasciitis    bilateral  . Sickle cell anemia (HCC)    sickle cell trait  . Sleep apnea   . Tendonitis    left hand/wrist  . UC (ulcerative colitis) ([place])     Past Surgical History:  Procedure Laterality Date  . ABDOMINAL HYSTERECTOMY    . APPENDECTOMY    . CESAREAN SECTION     x4  . COLONOSCOPY     Multiple in [place]  . ESOPHAGOGASTRODUODENOSCOPY      Social History   Tobacco Use  . Smoking status: Former Smoker    Types: Cigarettes    Quit date: 10/13/2006    Years since quitting: 13.3  . Smokeless tobacco: Never Used  Substance Use Topics  . Alcohol use: Yes    Comment: occasional    Family History  Problem Relation Age of Onset  . Stroke Mother   . Diabetes Mother   . Hypertension Mother   . Lung cancer Mother   . Sickle cell trait Mother   . Diabetes Maternal Aunt   . Lung cancer Maternal Aunt   . Lung cancer Maternal Aunt   . Lung cancer Maternal Aunt   . Colon cancer Cousin 37  . Sickle cell trait Father   . Sickle cell anemia Sister 62  . Lung cancer Maternal Grandmother   . Sickle cell anemia Sister   . Other Sister        accident and blood clot formed    ROS Per hpi  OBJECTIVE:  Today's Vitals   02/02/20 1037  BP: 121/83  Pulse: 82    Temp: 98 F (36.7 C)  SpO2: 99%  Weight: 154 lb (69.9 kg)  Height: 5' 2"  (1.575 m)   Body mass index is 28.17 kg/m.   Physical Exam Vitals and nursing note reviewed.  Constitutional:      Appearance: She is well-developed.  HENT:     Head: Normocephalic and atraumatic.  Right Ear: Hearing, tympanic membrane, ear canal and external ear normal.     Left Ear: Hearing, tympanic membrane, ear canal and external ear normal.  Eyes:     Conjunctiva/sclera: Conjunctivae normal.     Pupils: Pupils are equal, round, and reactive to light.  Cardiovascular:     Rate and Rhythm: Normal rate and regular rhythm.     Heart sounds: Normal heart sounds. No murmur. No friction rub. No gallop.   Pulmonary:     Effort: Pulmonary effort is normal.     Breath sounds: Normal breath sounds. No wheezing or rales.  Musculoskeletal:     Cervical back: Neck supple.  Lymphadenopathy:     Cervical: No cervical adenopathy.  Skin:    General: Skin is warm and dry.  Neurological:     Mental Status: She is alert and oriented to person, place, and time.     No results found for this or any previous visit (from the past 24 hour(s)).  No results found.   ASSESSMENT and PLAN  1. Seasonal allergies Not controlled. Discussed changing oral antihistamine, use or oral decongestant and nasal saline washes. adding eye drops for allergic conjunctivitis.  2. Type 2 diabetes mellitus with hyperglycemia, with long-term current use of insulin ([place]) Labs pending. Discussed lowering breakfast novolog to 22 units if continued prelunch lows, also increasing basaglar to 48 units if fasting cont to remain above goal 90-130.  - Hemoglobin A1c  3. Hyperlipidemia associated with type 2 diabetes mellitus ([place]) Labs pending, medications to be adjusted as needed - Lipid panel  Other orders - Olopatadine HCl 0.2 % SOLN; Apply 1 drop to eye daily.  Return in about 3 months (around 05/04/2020).    Rutherford Guys,  MD [facility] [place], [place] [address] Ph.  [phone] Fax [phone]

## 2020-02-02 NOTE — Patient Instructions (Addendum)
Generic zyrtec > allegra > xyzal  Oral decongestant pseudophenlyephrine 45m every 4 hours  Nasal saline washes  If you have lab work done today you will be contacted with your lab results within the next 2 weeks.  If you have not heard from [place] please contact [place] The fastest way to get your results is to register for My Chart.   IF you received an x-ray today, you will receive an [facility] from [facility]. Please contact [facility] at [phone] questions or concerns regarding your invoice.   IF you received labwork today, you will receive an invoice from [facility] Please contact [facility] at [phone] questions or concerns regarding your invoice.   Our billing staff will not be able to assist you with questions regarding bills from these companies.  You will be contacted with the lab results as soon as they are available. The fastest way to get your results is to activate your My Chart account. Instructions are located on the last page of this paperwork. If you have not heard from [place] the results in 2 weeks, please contact this office.

## 2020-02-10 ENCOUNTER — Other Ambulatory Visit: Payer: Self-pay | Admitting: Family Medicine

## 2020-02-10 MED ORDER — ATORVASTATIN CALCIUM 40 MG PO TABS
40.0000 mg | ORAL_TABLET | Freq: Every day | ORAL | 3 refills | Status: DC
Start: 2020-02-10 — End: 2021-08-29

## 2020-02-22 ENCOUNTER — Ambulatory Visit: Payer: Medicare Other

## 2020-02-27 ENCOUNTER — Telehealth: Payer: Self-pay | Admitting: *Deleted

## 2020-02-27 NOTE — Telephone Encounter (Signed)
Schedule AWV.  

## 2020-03-01 ENCOUNTER — Ambulatory Visit (INDEPENDENT_AMBULATORY_CARE_PROVIDER_SITE_OTHER): Payer: Medicare Other | Admitting: Emergency Medicine

## 2020-03-01 VITALS — BP 121/83 | Ht 62.0 in | Wt 154.0 lb

## 2020-03-01 DIAGNOSIS — E1165 Type 2 diabetes mellitus with hyperglycemia: Secondary | ICD-10-CM

## 2020-03-01 DIAGNOSIS — Z0000 Encounter for general adult medical examination without abnormal findings: Secondary | ICD-10-CM | POA: Diagnosis not present

## 2020-03-01 DIAGNOSIS — Z794 Long term (current) use of insulin: Secondary | ICD-10-CM

## 2020-03-01 NOTE — Progress Notes (Signed)
Presents today for [employer] Visit   Date of last exam: 02-02-2020  Interpreter used for this visit?  No  I connected with  Jacquelyne Guzy-Hines on 03/01/20 by a telephone  and verified that I am speaking with the correct person using two identifiers.   I discussed the limitations of evaluation and management by telemedicine. The patient expressed understanding and agreed to proceed.    Patient Care Team: Rutherford Guys, MD as PCP - General (Family Medicine)   Other items to address today:   Discussed Immunizations Discussed Eye/Dental   Other Screening: Last screening for diabetes: 02/02/2020 Last lipid screening:5-27/2021  ADVANCE DIRECTIVES: Discussed: yes On File: no Materials Provided: no  Immunization status:  Immunization History  Administered Date(s) Administered  . Influenza,inj,Quad PF,6+ Mos 07/13/2017, 05/07/2018, 09/25/2019  . Pneumococcal Polysaccharide-23 11/10/2018  . Tdap 08/04/2017     Health Maintenance Due  Topic Date Due  . COVID-19 Vaccine (1) Never done     Functional Status Survey: Is the patient deaf or have difficulty hearing?: No Does the patient have difficulty seeing, even when wearing glasses/contacts?: No Does the patient have difficulty concentrating, remembering, or making decisions?: No Does the patient have difficulty walking or climbing stairs?: No Does the patient have difficulty dressing or bathing?: No Does the patient have difficulty doing errands alone such as visiting a doctor's office or shopping?: No   6CIT Screen 03/01/2020 02/03/2019  What Year? 0 points 0 points  What month? 0 points 0 points  What time? 0 points 0 points  Count back from 20 0 points 0 points  Months in reverse 0 points 0 points  Repeat phrase 0 points 0 points  Total Score 0 0        Clinical Support from 03/01/2020 in [place] [facility]  AUDIT-C Score 2       Home Environment:   No trouble climbing  stairs Live in two story home No grab bars No scattered rugs Adequate lighting/ no clutter   Patient Active Problem List   Diagnosis Date Noted  . Elevated glucose 12/23/2019  . Diabetic ketoacidosis without coma associated with type 2 diabetes mellitus ([place]) 12/23/2019  . De Quervain's tenosynovitis, left 11/16/2018  . Bilateral carpal tunnel syndrome 04/13/2018  . Leukocytosis 10/04/2017  . Hyperlipidemia 08/04/2017  . Ulcerative colitis without complications ([place]) 30/94/0768  . Plantar fasciitis 05/28/2017     Past Medical History:  Diagnosis Date  . Anemia   . Carpal tunnel syndrome    bilateral  . Chronic heel pain   . Diabetes mellitus without complication ([place])   . DJD (degenerative joint disease) of cervical spine    MRI 2017  . Hyperlipidemia   . Plantar fasciitis    bilateral  . Sickle cell anemia (HCC)    sickle cell trait  . Sleep apnea   . Tendonitis    left hand/wrist  . UC (ulcerative colitis) ([place])      Past Surgical History:  Procedure Laterality Date  . ABDOMINAL HYSTERECTOMY    . APPENDECTOMY    . CESAREAN SECTION     x4  . COLONOSCOPY     Multiple in [place]  . ESOPHAGOGASTRODUODENOSCOPY       Family History  Problem Relation Age of Onset  . Stroke Mother   . Diabetes Mother   . Hypertension Mother   . Lung cancer Mother   . Sickle cell trait Mother   . Diabetes Maternal Aunt   .  Lung cancer Maternal Aunt   . Lung cancer Maternal Aunt   . Lung cancer Maternal Aunt   . Colon cancer Cousin 50  . Sickle cell trait Father   . Sickle cell anemia Sister 52  . Lung cancer Maternal Grandmother   . Sickle cell anemia Sister   . Other Sister        accident and blood clot formed     Social History   Socioeconomic History  . Marital status: Married    Spouse name: Simona Huh  . Number of children: 4  . Years of education: Not on file  . Highest education level: Not on file  Occupational History  . Occupation: retired     Comment: He formerly worked for [employer] of [employer], disabled due to a fall  Tobacco Use  . Smoking status: Former Smoker    Types: Cigarettes    Quit date: 10/13/2006    Years since quitting: 13.3  . Smokeless tobacco: Never Used  Vaping Use  . Vaping Use: Never used  Substance and Sexual Activity  . Alcohol use: Yes    Comment: occasional  . Drug use: No  . Sexual activity: Yes    Partners: Male    Comment: 1ST intercourse- 88, partners- 36, married- 45 yrs   Other Topics Concern  . Not on file  Social History Narrative   She is married, 3 sons born 1979, 1981, 1984.  One daughter born in 66.   She is medically retired from the [employer] after a fall and a hip injury, around 2004.   Caffeinated beverage every other day x1   07/13/2017   Social Determinants of Health   Financial Resource Strain:   . Difficulty of Paying Living Expenses:   Food Insecurity:   . Worried About Charity fundraiser in the Last Year:   . Arboriculturist in the Last Year:   Transportation Needs:   . Film/video editor (Medical):   Marland Kitchen Lack of Transportation (Non-Medical):   Physical Activity:   . Days of Exercise per Week:   . Minutes of Exercise per Session:   Stress:   . Feeling of Stress :   Social Connections:   . Frequency of Communication with Friends and Family:   . Frequency of Social Gatherings with Friends and Family:   . Attends Religious Services:   . Active Member of Clubs or Organizations:   . Attends Archivist Meetings:   Marland Kitchen Marital Status:   Intimate Partner Violence:   . Fear of Current or Ex-Partner:   . Emotionally Abused:   Marland Kitchen Physically Abused:   . Sexually Abused:      Allergies  Allergen Reactions  . Codeine Itching, Rash and Hives  . Latex Hives    With the power     Prior to Admission medications   Medication Sig Start Date End Date Taking? Authorizing Provider  acetaminophen (TYLENOL) 500 MG tablet Take  500 mg by mouth every 6 (six) hours as needed for mild pain or headache.   Yes [provider]  amLODipine (NORVASC) 5 MG tablet Take 5 mg by mouth at bedtime.  12/15/19  Yes [provider]  atorvastatin (LIPITOR) 40 MG tablet Take 1 tablet (40 mg total) by mouth daily. 02/10/20  Yes Rutherford Guys, MD  cyclobenzaprine (FLEXERIL) 10 MG tablet Take 1 tablet (10 mg total) by mouth 3 (three) times daily as needed for  muscle spasms. 09/26/19  Yes Rutherford Guys, MD  DULoxetine (CYMBALTA) 60 MG capsule Take 1 capsule (60 mg total) by mouth daily. 09/26/19  Yes Rutherford Guys, MD  fluticasone (FLONASE) 50 MCG/ACT nasal spray SPRAY 2 SPRAYS INTO EACH NOSTRIL EVERY DAY Patient taking differently: Place 2 sprays into both nostrils daily.  12/14/19  Yes Rutherford Guys, MD  glucose blood [facility] VERIO) test strip Use as instructed 12/16/19  Yes Rutherford Guys, MD  insulin aspart (NOVOLOG FLEXPEN) 100 UNIT/ML FlexPen Inject 15 Units into the skin 3 (three) times daily with meals. 12/26/19  Yes Rutherford Guys, MD  Insulin Glargine [facility]) 100 UNIT/ML Inject 0.4 mLs (40 Units total) into the skin at bedtime. 12/26/19  Yes Rutherford Guys, MD  Insulin Syringe-Needle U-100 (INSULIN SYRINGE 1CC/31GX5/16") 31G X 5/16" 1 ML MISC Inject 0.08 ( 8 Units)  into the skin 3 times daily before meals. 10/16/18  Yes Rutherford Guys, MD  Lancets (ONETOUCH DELICA PLUS [id number]) [place] 1 each by Does not apply route 3 (three) times daily. Dx E11.65, z79.4 01/24/19  Yes Rutherford Guys, MD  metFORMIN (GLUCOPHAGE) 500 MG tablet Take 1 tablet (500 mg total) by mouth 2 (two) times daily with a meal. TAKE 1 TABLET BY MOUTH TWICE DAILY FOR TWO WEEKS. THEN TAKE 2 TABLETS BY MOUTH TWICE DAILY. 12/26/19  Yes Rutherford Guys, MD  NONFORMULARY OR COMPOUNDED ITEM Swish and spit 5 mLs as needed (ulcer flare up in mouth). Magic mouthwash with lidocaine   Yes [provider]  Olopatadine HCl 0.2 % SOLN Apply  1 drop to eye daily. 02/02/20  Yes Rutherford Guys, MD  mesalamine (LIALDA) 1.2 g EC tablet Take 2 tablets (2.4 g total) by mouth 2 (two) times daily. Patient taking differently: Take 4.8 g by mouth daily with breakfast.  11/25/18 02/02/20  Gatha Mayer, MD     Depression screen [facility] 2/9 03/01/2020 02/02/2020 01/17/2020 01/03/2020 12/26/2019  Decreased Interest 0 0 0 0 0  Down, Depressed, Hopeless 0 0 0 0 0  PHQ - 2 Score 0 0 0 0 0     Fall Risk  03/01/2020 02/02/2020 01/17/2020 01/03/2020 12/26/2019  Falls in the past year? 0 0 0 0 0  Number falls in past yr: 0 0 - - 0  Injury with Fall? 0 0 - - 0  Follow up - Falls evaluation completed Falls evaluation completed Falls evaluation completed Falls evaluation completed      PHYSICAL EXAM: BP 121/83 Comment: taken from a previous visit  Ht 5' 2"  (1.575 m)   Wt 154 lb (69.9 kg)   BMI 28.17 kg/m    Wt Readings from Last 3 Encounters:  03/01/20 154 lb (69.9 kg)  02/22/20 154 lb (69.9 kg)  02/02/20 154 lb (69.9 kg)       Education/Counseling provided regarding diet and exercise, prevention of chronic diseases, smoking/tobacco cessation, if applicable, and reviewed "Covered Medicare Preventive Services."

## 2020-03-01 NOTE — Patient Instructions (Signed)
Thank you for taking time to come for your Medicare Wellness Visit. I appreciate your ongoing commitment to your health goals. Please review the following plan we discussed and let me know if I can assist you in the future.  Leroy Kennedy LPN  Preventive Care 54-58 Years Old, Female Preventive care refers to visits with your health care provider and lifestyle choices that can promote health and wellness. This includes:  A yearly physical exam. This may also be called an annual well check.  Regular dental visits and eye exams.  Immunizations.  Screening for certain conditions.  Healthy lifestyle choices, such as eating a healthy diet, getting regular exercise, not using drugs or products that contain nicotine and tobacco, and limiting alcohol use. What can I expect for my preventive care visit? Physical exam Your health care provider will check your:  Height and weight. This may be used to calculate body mass index (BMI), which tells if you are at a healthy weight.  Heart rate and blood pressure.  Skin for abnormal spots. Counseling Your health care provider may ask you questions about your:  Alcohol, tobacco, and drug use.  Emotional well-being.  Home and relationship well-being.  Sexual activity.  Eating habits.  Work and work Statistician.  Method of birth control.  Menstrual cycle.  Pregnancy history. What immunizations do I need?  Influenza (flu) vaccine  This is recommended every year. Tetanus, diphtheria, and pertussis (Tdap) vaccine  You may need a Td booster every 10 years. Varicella (chickenpox) vaccine  You may need this if you have not been vaccinated. Zoster (shingles) vaccine  You may need this after age 25. Measles, mumps, and rubella (MMR) vaccine  You may need at least one dose of MMR if you were born in 1957 or later. You may also need a second dose. Pneumococcal conjugate (PCV13) vaccine  You may need this if you have certain conditions  and were not previously vaccinated. Pneumococcal polysaccharide (PPSV23) vaccine  You may need one or two doses if you smoke cigarettes or if you have certain conditions. Meningococcal conjugate (MenACWY) vaccine  You may need this if you have certain conditions. Hepatitis A vaccine  You may need this if you have certain conditions or if you travel or work in places where you may be exposed to hepatitis A. Hepatitis B vaccine  You may need this if you have certain conditions or if you travel or work in places where you may be exposed to hepatitis B. Haemophilus influenzae type b (Hib) vaccine  You may need this if you have certain conditions. Human papillomavirus (HPV) vaccine  If recommended by your health care provider, you may need three doses over 6 months. You may receive vaccines as individual doses or as more than one vaccine together in one shot (combination vaccines). Talk with your health care provider about the risks and benefits of combination vaccines. What tests do I need? Blood tests  Lipid and cholesterol levels. These may be checked every 5 years, or more frequently if you are over 58 years old.  Hepatitis C test.  Hepatitis B test. Screening  Lung cancer screening. You may have this screening every year starting at age 8 if you have a 30-pack-year history of smoking and currently smoke or have quit within the past 15 years.  Colorectal cancer screening. All adults should have this screening starting at age 103 and continuing until age 42. Your health care provider may recommend screening at age 57 if you are  at increased risk. You will have tests every 1-10 years, depending on your results and the type of screening test.  Diabetes screening. This is done by checking your blood sugar (glucose) after you have not eaten for a while (fasting). You may have this done every 1-3 years.  Mammogram. This may be done every 1-2 years. Talk with your health care provider  about when you should start having regular mammograms. This may depend on whether you have a family history of breast cancer.  BRCA-related cancer screening. This may be done if you have a family history of breast, ovarian, tubal, or peritoneal cancers.  Pelvic exam and Pap test. This may be done every 3 years starting at age 28. Starting at age 45, this may be done every 5 years if you have a Pap test in combination with an HPV test. Other tests  Sexually transmitted disease (STD) testing.  Bone density scan. This is done to screen for osteoporosis. You may have this scan if you are at high risk for osteoporosis. Follow these instructions at home: Eating and drinking  Eat a diet that includes fresh fruits and vegetables, whole grains, lean protein, and low-fat dairy.  Take vitamin and mineral supplements as recommended by your health care provider.  Do not drink alcohol if: ? Your health care provider tells you not to drink. ? You are pregnant, may be pregnant, or are planning to become pregnant.  If you drink alcohol: ? Limit how much you have to 0-1 drink a day. ? Be aware of how much alcohol is in your drink. In the [place] one drink equals one 12 oz bottle of beer (355 mL), one 5 oz glass of wine (148 mL), or one 1 oz glass of hard liquor (44 mL). Lifestyle  Take daily care of your teeth and gums.  Stay active. Exercise for at least 30 minutes on 5 or more days each week.  Do not use any products that contain nicotine or tobacco, such as cigarettes, e-cigarettes, and chewing tobacco. If you need help quitting, ask your health care provider.  If you are sexually active, practice safe sex. Use a condom or other form of birth control (contraception) in order to prevent pregnancy and STIs (sexually transmitted infections).  If told by your health care provider, take low-dose aspirin daily starting at age 67. What's next?  Visit your health care provider once a year for a well  check visit.  Ask your health care provider how often you should have your eyes and teeth checked.  Stay up to date on all vaccines. This information is not intended to replace advice given to you by your health care provider. Make sure you discuss any questions you have with your health care provider. Document Revised: 05/06/2018 Document Reviewed: 05/06/2018 Elsevier Patient Education  2020 [employer].

## 2020-03-02 ENCOUNTER — Other Ambulatory Visit: Payer: Self-pay | Admitting: Family Medicine

## 2020-03-02 ENCOUNTER — Ambulatory Visit (INDEPENDENT_AMBULATORY_CARE_PROVIDER_SITE_OTHER): Payer: Medicare Other

## 2020-03-02 ENCOUNTER — Ambulatory Visit (INDEPENDENT_AMBULATORY_CARE_PROVIDER_SITE_OTHER): Payer: Medicare Other | Admitting: Registered Nurse

## 2020-03-02 ENCOUNTER — Encounter: Payer: Self-pay | Admitting: Registered Nurse

## 2020-03-02 ENCOUNTER — Other Ambulatory Visit: Payer: Self-pay

## 2020-03-02 VITALS — BP 137/90 | HR 78 | Temp 98.1°F | Resp 18 | Ht 62.0 in | Wt 157.2 lb

## 2020-03-02 DIAGNOSIS — R05 Cough: Secondary | ICD-10-CM

## 2020-03-02 DIAGNOSIS — E1165 Type 2 diabetes mellitus with hyperglycemia: Secondary | ICD-10-CM

## 2020-03-02 DIAGNOSIS — Z794 Long term (current) use of insulin: Secondary | ICD-10-CM | POA: Diagnosis not present

## 2020-03-02 DIAGNOSIS — R0609 Other forms of dyspnea: Secondary | ICD-10-CM | POA: Diagnosis not present

## 2020-03-02 DIAGNOSIS — R059 Cough, unspecified: Secondary | ICD-10-CM

## 2020-03-02 IMAGING — DX DG CHEST 2V
3 series · 3 of 3 positions shown · non-contrast
Comparison: Radiographs [DATE].

CLINICAL DATA: Cough.

EXAM:
CHEST - 2 VIEW

[chest pa (1 of 2)]
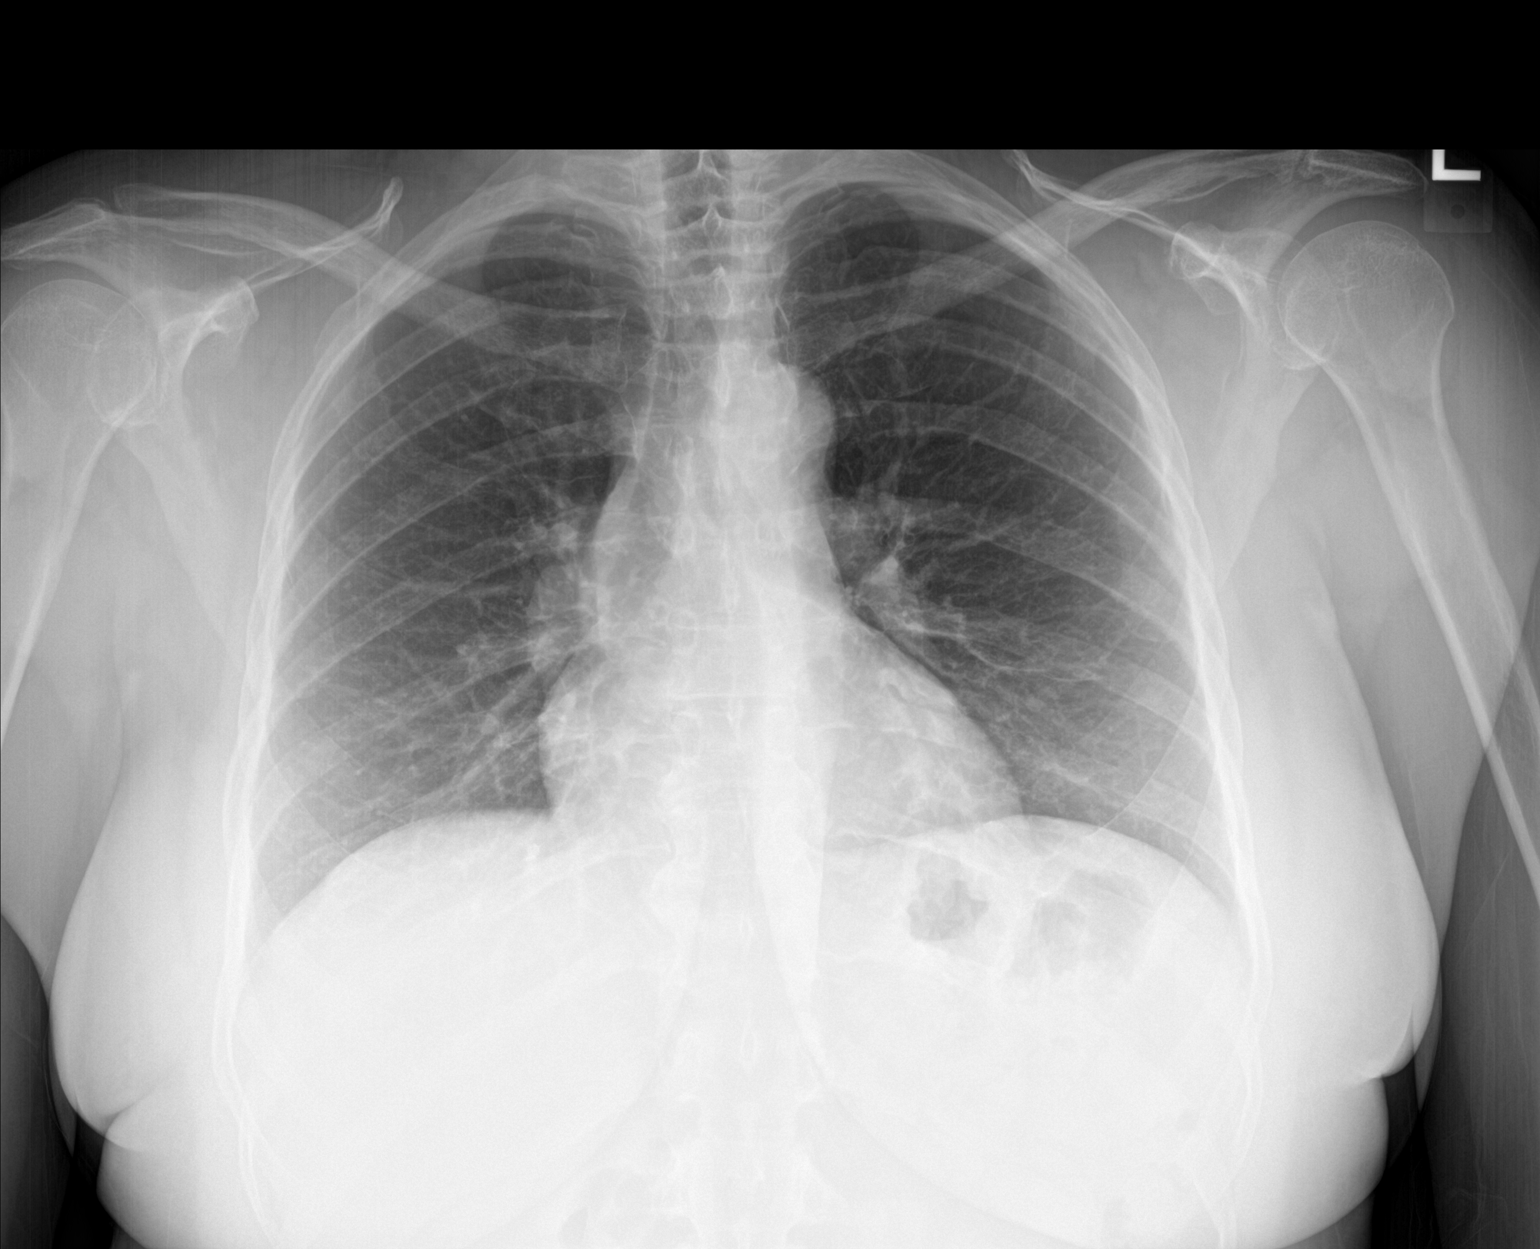

[chest lat]
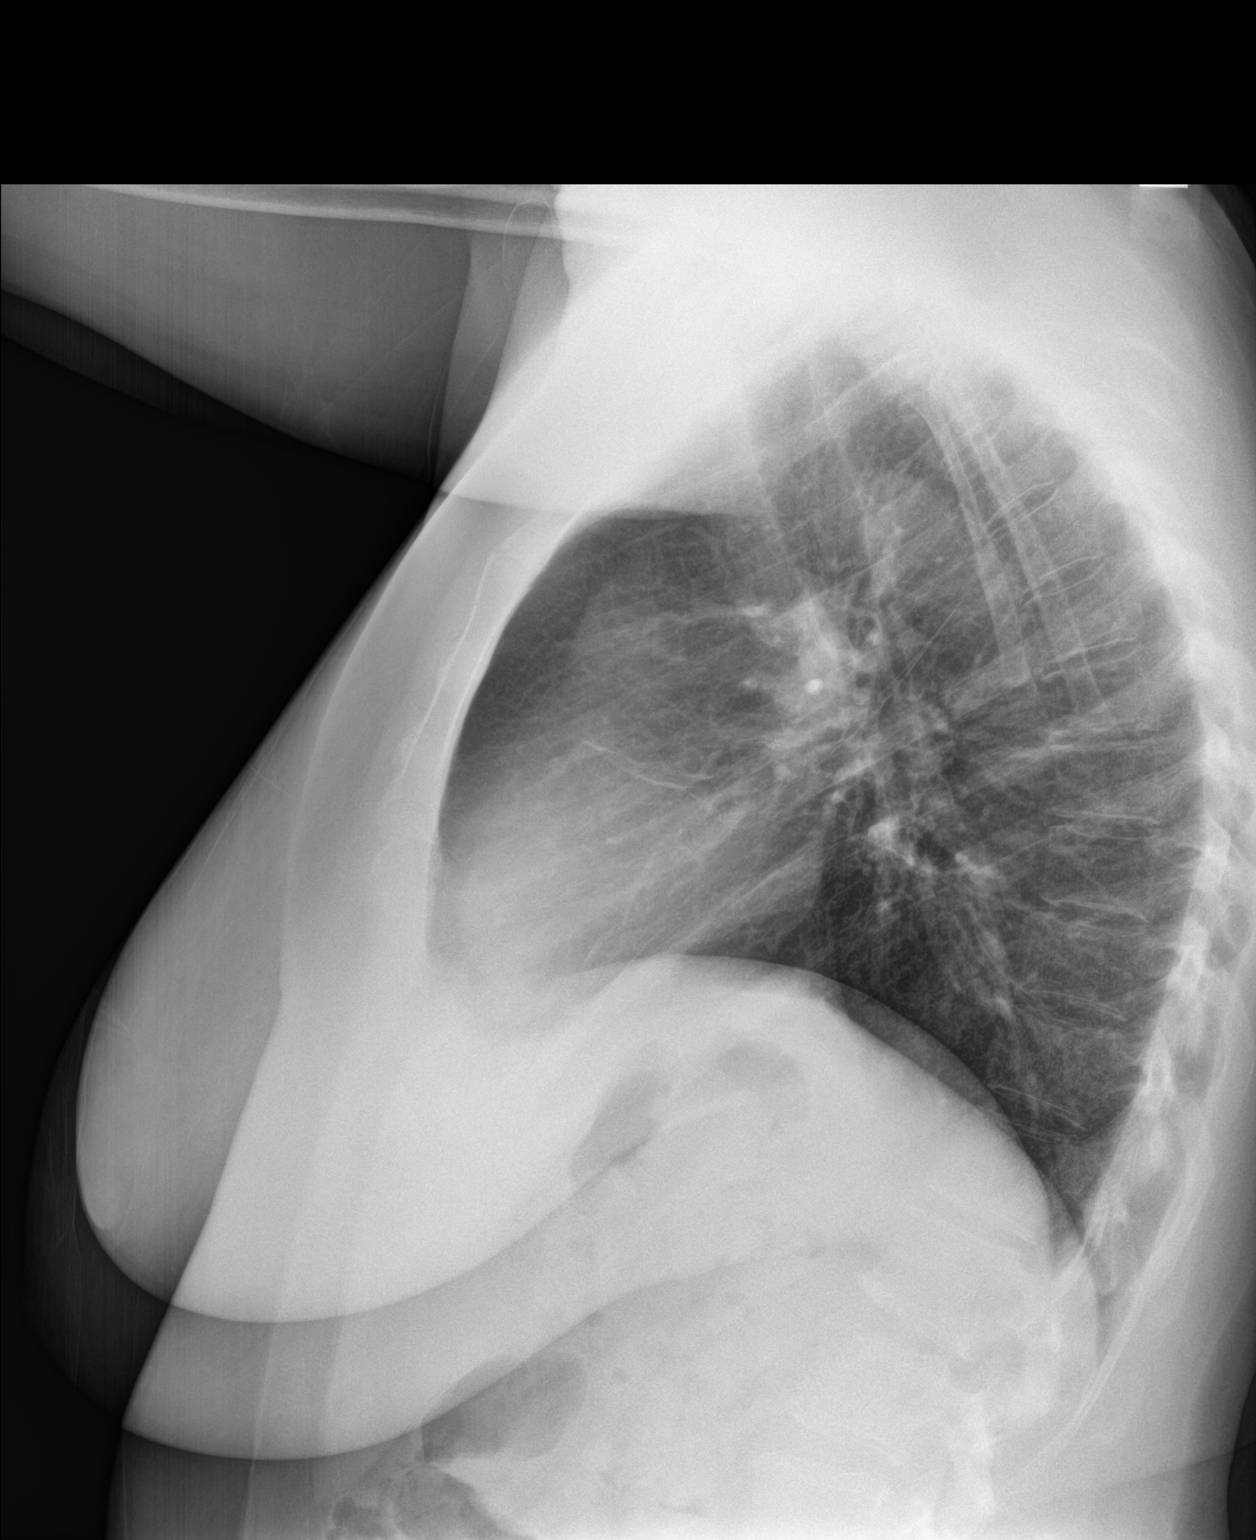

[chest pa (2 of 2)]
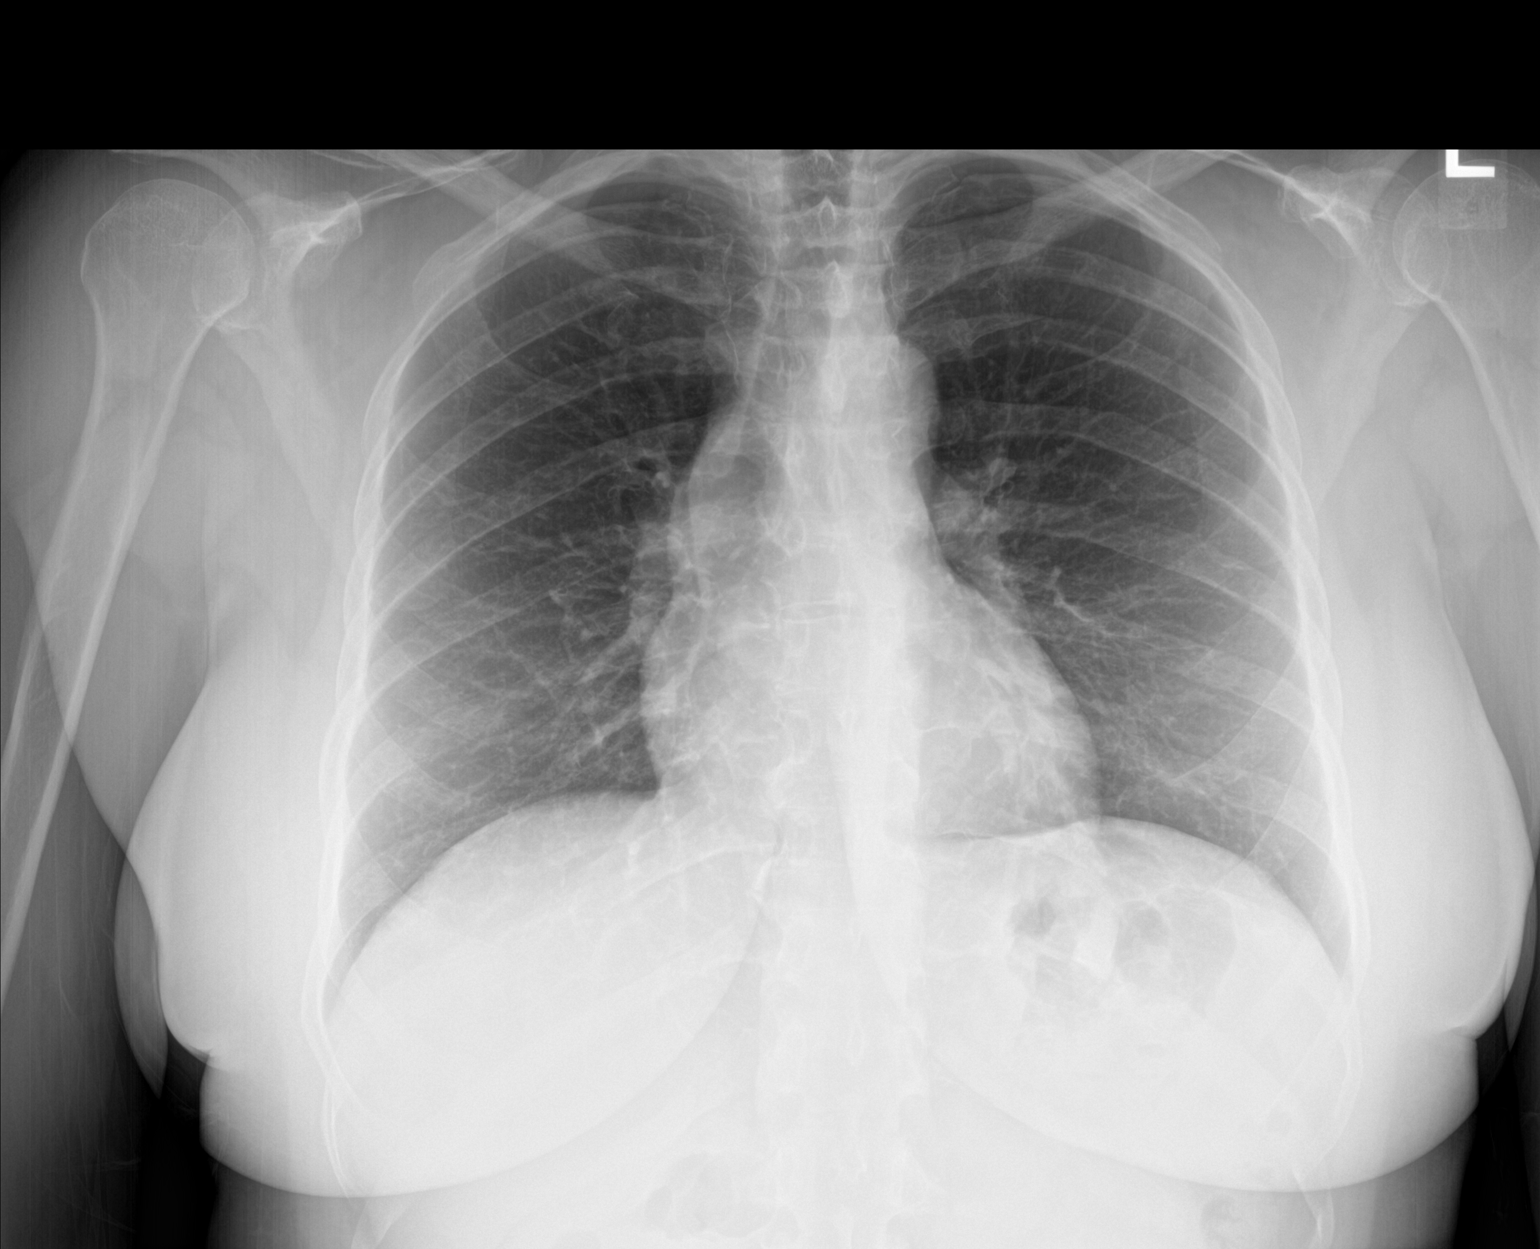

[3 of 3 positions shown; findings below may reference images not displayed]

FINDINGS: The PA view was repeated. Improved inspiratory effort compared with
previous study. Minimal subsegmental atelectasis or scarring at both
lung bases. The lungs are otherwise clear. There is no pleural
effusion or pneumothorax. The heart size and mediastinal contours
are normal. No acute osseous findings.
IMPRESSION: Minimal bibasilar atelectasis or scarring. No evidence of pneumonia.

## 2020-03-02 MED ORDER — ALBUTEROL SULFATE HFA 108 (90 BASE) MCG/ACT IN AERS: 2.0000 | INHALATION_SPRAY | Freq: Four times a day (QID) | RESPIRATORY_TRACT | 3 refills | Status: DC | PRN

## 2020-03-02 MED ORDER — MONTELUKAST SODIUM 10 MG PO TABS: 10.0000 mg | ORAL_TABLET | Freq: Every day | ORAL | 3 refills | Status: DC

## 2020-03-02 NOTE — Patient Instructions (Signed)
° ° ° °  If you have lab work done today you will be contacted with your lab results within the next 2 weeks.  If you have not heard from us then please contact us. The fastest way to get your results is to register for My Chart. ° ° °IF you received an x-ray today, you will receive an invoice from [facility]. Please contact [facility] at [phone] with questions or concerns regarding your invoice.  ° °IF you received labwork today, you will receive an invoice from [facility]. Please contact [facility] at [phone] with questions or concerns regarding your invoice.  ° °Our billing staff will not be able to assist you with questions regarding bills from these companies. ° °You will be contacted with the lab results as soon as they are available. The fastest way to get your results is to activate your My Chart account. Instructions are located on the last page of this paperwork. If you have not heard from us regarding the results in 2 weeks, please contact this office. °  ° ° ° °

## 2020-03-03 LAB — CBC
Hematocrit: 36.6 % (ref 34.0–46.6)
Hemoglobin: 13 g/dL (ref 11.1–15.9)
MCH: 29.8 pg (ref 26.6–33.0)
MCHC: 35.5 g/dL (ref 31.5–35.7)
MCV: 84 fL (ref 79–97)
Platelets: 355 10*3/uL (ref 150–450)
RBC: 4.36 x10E6/uL (ref 3.77–5.28)
RDW: 12.8 % (ref 11.7–15.4)
WBC: 12.7 10*3/uL — ABNORMAL HIGH (ref 3.4–10.8)

## 2020-03-03 LAB — COMPREHENSIVE METABOLIC PANEL
ALT: 67 IU/L — ABNORMAL HIGH (ref 0–32)
AST: 44 IU/L — ABNORMAL HIGH (ref 0–40)
Albumin/Globulin Ratio: 1.2 (ref 1.2–2.2)
Albumin: 4.1 g/dL (ref 3.8–4.9)
Alkaline Phosphatase: 176 IU/L — ABNORMAL HIGH (ref 48–121)
BUN/Creatinine Ratio: 15 (ref 9–23)
BUN: 12 mg/dL (ref 6–24)
Bilirubin Total: 0.3 mg/dL (ref 0.0–1.2)
CO2: 28 mmol/L (ref 20–29)
Calcium: 9.8 mg/dL (ref 8.7–10.2)
Chloride: 105 mmol/L (ref 96–106)
Creatinine, Ser: 0.82 mg/dL (ref 0.57–1.00)
GFR calc Af Amer: 91 mL/min/{1.73_m2} (ref >59)
GFR calc non Af Amer: 79 mL/min/{1.73_m2} (ref >59)
Globulin, Total: 3.3 g/dL (ref 1.5–4.5)
Glucose: 73 mg/dL (ref 65–99)
Potassium: 4.3 mmol/L (ref 3.5–5.2)
Sodium: 143 mmol/L (ref 134–144)
Total Protein: 7.4 g/dL (ref 6.0–8.5)

## 2020-03-03 LAB — BRAIN NATRIURETIC PEPTIDE: BNP: 21.7 pg/mL (ref 0.0–100.0)

## 2020-03-03 LAB — HEMOGLOBIN A1C
Est. average glucose Bld gHb Est-mCnc: 183 mg/dL
Hgb A1c MFr Bld: 8 % — ABNORMAL HIGH (ref 4.8–5.6)

## 2020-03-05 NOTE — Progress Notes (Signed)
FYI

## 2020-03-08 ENCOUNTER — Other Ambulatory Visit: Payer: Self-pay | Admitting: Family Medicine

## 2020-03-08 ENCOUNTER — Telehealth: Payer: Self-pay | Admitting: Nurse Practitioner

## 2020-03-08 NOTE — Telephone Encounter (Signed)
Spoke with this very kind patient. She has UC, not on biologics. She has developed aphthous ulcers in her mouth and bouts of diarrhea. She tells me this has been off and on for the past month. She is to the point that it is too painful to eat, she has nausea and her mid to upper abdomen is becoming tender. She is hungry at times and will eat jello. Cannot tolerate crackers because of the ulcers. Afebrile. No blood in her bowel movements. Describes the movement as loose then watery at least twice daily.  No openings on the schedule. [facility]ses [facility] [address]. Allergic to codeine Reluctant to use prednisone due to her diabetes.

## 2020-03-09 MED ORDER — LIDOCAINE VISCOUS HCL 2 % MT SOLN: 15.0000 mL | OROMUCOSAL | 0 refills | Status: DC | PRN

## 2020-03-09 MED ORDER — DEXAMETHASONE 0.5 MG/5ML PO ELIX: 0.5000 mg | ORAL_SOLUTION | Freq: Four times a day (QID) | ORAL | 0 refills | Status: DC

## 2020-03-09 NOTE — Telephone Encounter (Signed)
Spoke with patient regarding recommendations. Advised that prescriptions had been sent to pharmacy. Pt states that she will have her husband take a picture and will send it through My Chart. Advised to update [place] on Tuesday, may need to go to Urgent care if there is no relief with medications that have been sent in.

## 2020-03-09 NOTE — Telephone Encounter (Signed)
I sent in  Medications for the mouth ulcers  The dexamethasone is to heal the ulcers and the lidocaine is to alleviate pain and might be best before eating Ask her to take a photo of them and if she can send that by my chart  She can update [place] on Tuesday  Call back prn if getting worse but she may need to go to urgent care if this does not help

## 2020-03-13 ENCOUNTER — Encounter: Payer: Self-pay | Admitting: Family Medicine

## 2020-03-13 ENCOUNTER — Other Ambulatory Visit: Payer: Self-pay

## 2020-03-13 ENCOUNTER — Ambulatory Visit (INDEPENDENT_AMBULATORY_CARE_PROVIDER_SITE_OTHER): Payer: Medicare Other | Admitting: Family Medicine

## 2020-03-13 VITALS — BP 140/90 | HR 63 | Temp 98.7°F | Wt 156.8 lb

## 2020-03-13 DIAGNOSIS — R05 Cough: Secondary | ICD-10-CM

## 2020-03-13 DIAGNOSIS — R059 Cough, unspecified: Secondary | ICD-10-CM

## 2020-03-13 MED ORDER — PROMETHAZINE-DM 6.25-15 MG/5ML PO SYRP: 5.0000 mL | ORAL_SOLUTION | Freq: Four times a day (QID) | ORAL | 0 refills | Status: DC | PRN

## 2020-03-13 MED ORDER — ALBUTEROL SULFATE HFA 108 (90 BASE) MCG/ACT IN AERS: 2.0000 | INHALATION_SPRAY | Freq: Four times a day (QID) | RESPIRATORY_TRACT | 3 refills | Status: AC | PRN

## 2020-03-13 MED ORDER — BENZONATATE 100 MG PO CAPS: 100.0000 mg | ORAL_CAPSULE | Freq: Three times a day (TID) | ORAL | 0 refills | Status: DC | PRN

## 2020-03-13 NOTE — Patient Instructions (Signed)
° ° ° °  If you have lab work done today you will be contacted with your lab results within the next 2 weeks.  If you have not heard from us then please contact us. The fastest way to get your results is to register for My Chart. ° ° °IF you received an x-ray today, you will receive an invoice from [facility]. Please contact [facility] at [phone] with questions or concerns regarding your invoice.  ° °IF you received labwork today, you will receive an invoice from [facility]. Please contact [facility] at [phone] with questions or concerns regarding your invoice.  ° °Our billing staff will not be able to assist you with questions regarding bills from these companies. ° °You will be contacted with the lab results as soon as they are available. The fastest way to get your results is to activate your My Chart account. Instructions are located on the last page of this paperwork. If you have not heard from us regarding the results in 2 weeks, please contact this office. °  ° ° ° °

## 2020-03-13 NOTE — Progress Notes (Signed)
7/6/20219:20 AM  Beverly Ramirez 31-May-1961, 59 y.o., female [id number]  Chief Complaint  Patient presents with  . Follow-up    having colitis flare, told that she be on prednisone, that meds makes her blood sugar increase. She is also still having the cough. Also asking for the magic mouth wash for the painful blisters in her mouth    HPI:   Patient is a 59 y.o. female with past medical history significant for ulcerative collitis, DM2, HLP, HTN and fibromyalgia who presents today for cough  GI called in dexamethasone swish and spit and viscous lidocaine for her mouth ulcers  She has been coughing for about 2 weeks Seen by [facility] on June 25th for cough Unremarkable cxr, started Singulair and albuterol She is also using flonase and oral decongestant Normal BNP Never had fever or chills, no changes to taste or smell She denies any PND or reflux She would like to be tested for covid as she has not been vaccinated She is requesting cough medication   Depression screen [facility] 2/9 03/02/2020 03/01/2020 02/02/2020  Decreased Interest 0 0 0  Down, Depressed, Hopeless 0 0 0  PHQ - 2 Score 0 0 0    Fall Risk  03/13/2020 03/02/2020 03/01/2020 02/02/2020 01/17/2020  Falls in the past year? 0 0 0 0 0  Number falls in past yr: 0 0 0 0 -  Injury with Fall? 0 0 0 0 -  Follow up - Falls evaluation completed - Falls evaluation completed Falls evaluation completed     Allergies  Allergen Reactions  . Codeine Itching, Rash and Hives  . Latex Hives    With the power    Prior to Admission medications   Medication Sig Start Date End Date Taking? Authorizing Provider  acetaminophen (TYLENOL) 500 MG tablet Take 500 mg by mouth every 6 (six) hours as needed for mild pain or headache.   Yes [provider]  albuterol (VENTOLIN HFA) 108 (90 Base) MCG/ACT inhaler Inhale 2 puffs into the lungs every 6 (six) hours as needed for wheezing or shortness of breath. 03/02/20  Yes Maximiano Coss, NP   amLODipine (NORVASC) 5 MG tablet Take 5 mg by mouth at bedtime.  12/15/19  Yes [provider]  atorvastatin (LIPITOR) 40 MG tablet Take 1 tablet (40 mg total) by mouth daily. 02/10/20  Yes Rutherford Guys, MD  cyclobenzaprine (FLEXERIL) 10 MG tablet Take 1 tablet (10 mg total) by mouth 3 (three) times daily as needed for muscle spasms. 09/26/19  Yes Rutherford Guys, MD  dexamethasone 0.5 MG/5ML elixir Take 5 mLs (0.5 mg total) by mouth 4 (four) times daily. Swish and spit, hold in mouth 5 minutes each time 03/09/20  Yes Gatha Mayer, MD  DULoxetine (CYMBALTA) 60 MG capsule Take 1 capsule (60 mg total) by mouth daily. 09/26/19  Yes Rutherford Guys, MD  fluticasone [facility]) 50 MCG/ACT nasal spray SPRAY 2 SPRAYS INTO EACH NOSTRIL EVERY DAY 03/08/20  Yes Grant Fontana M, MD  glucose blood Mease [facility] VERIO) test strip Use as instructed 12/16/19  Yes Rutherford Guys, MD  insulin aspart (NOVOLOG FLEXPEN) 100 UNIT/ML FlexPen Inject 15 Units into the skin 3 (three) times daily with meals. 12/26/19  Yes Rutherford Guys, MD  Insulin Glargine [facility]) 100 UNIT/ML Inject 0.4 mLs (40 Units total) into the skin at bedtime. 12/26/19  Yes Rutherford Guys, MD  Insulin Syringe-Needle U-100 (INSULIN SYRINGE 1CC/31GX5/16") 31G X 5/16" 1 ML MISC Inject 0.08 (  8 Units)  into the skin 3 times daily before meals. 10/16/18  Yes Rutherford Guys, MD  Lancets (ONETOUCH DELICA PLUS PYPPJK93O) [place] 1 each by Does not apply route 3 (three) times daily. Dx E11.65, z79.4 01/24/19  Yes Rutherford Guys, MD  lidocaine (XYLOCAINE) 2 % solution Use as directed 15 mLs in the mouth or throat as needed for mouth pain. 03/09/20  Yes Gatha Mayer, MD  metFORMIN (GLUCOPHAGE) 500 MG tablet Take 1 tablet (500 mg total) by mouth 2 (two) times daily with a meal. TAKE 1 TABLET BY MOUTH TWICE DAILY FOR TWO WEEKS. THEN TAKE 2 TABLETS BY MOUTH TWICE DAILY. 12/26/19  Yes Rutherford Guys, MD  montelukast (SINGULAIR) 10 MG tablet Take 1  tablet (10 mg total) by mouth at bedtime. 03/02/20  Yes Maximiano Coss, NP  NONFORMULARY OR COMPOUNDED ITEM Swish and spit 5 mLs as needed (ulcer flare up in mouth). Magic mouthwash with lidocaine   Yes [provider]  Olopatadine HCl 0.2 % SOLN Apply 1 drop to eye daily. 02/02/20  Yes Rutherford Guys, MD  mesalamine (LIALDA) 1.2 g EC tablet Take 2 tablets (2.4 g total) by mouth 2 (two) times daily. Patient taking differently: Take 4.8 g by mouth daily with breakfast.  11/25/18 02/02/20  Gatha Mayer, MD    Past Medical History:  Diagnosis Date  . Anemia   . Carpal tunnel syndrome    bilateral  . Chronic heel pain   . Diabetes mellitus without complication ([place])   . DJD (degenerative joint disease) of cervical spine    MRI 2017  . Hyperlipidemia   . Plantar fasciitis    bilateral  . Sickle cell anemia ([facility])    sickle cell trait  . Sleep apnea   . Tendonitis    left hand/wrist  . UC (ulcerative colitis) ([place])     Past Surgical History:  Procedure Laterality Date  . ABDOMINAL HYSTERECTOMY    . APPENDECTOMY    . CESAREAN SECTION     x4  . COLONOSCOPY     Multiple in [place]  . ESOPHAGOGASTRODUODENOSCOPY      Social History   Tobacco Use  . Smoking status: Former Smoker    Types: Cigarettes    Quit date: 10/13/2006    Years since quitting: 13.4  . Smokeless tobacco: Never Used  Substance Use Topics  . Alcohol use: Yes    Comment: occasional    Family History  Problem Relation Age of Onset  . Stroke Mother   . Diabetes Mother   . Hypertension Mother   . Lung cancer Mother   . Sickle cell trait Mother   . Diabetes Maternal Aunt   . Lung cancer Maternal Aunt   . Lung cancer Maternal Aunt   . Lung cancer Maternal Aunt   . Colon cancer Cousin 25  . Sickle cell trait Father   . Sickle cell anemia Sister 61  . Lung cancer Maternal Grandmother   . Sickle cell anemia Sister   . Other Sister        accident and blood clot formed    ROS Per  hpi  OBJECTIVE:  Today's Vitals   03/13/20 0840  BP: 140/90  Pulse: 63  Temp: 98.7 F (37.1 C)  Weight: 156 lb 12.8 oz (71.1 kg)   Body mass index is 28.68 kg/m.   Physical Exam Vitals and nursing note reviewed.  Constitutional:      Appearance: She is well-developed.  She is not ill-appearing.  HENT:     Head: Normocephalic and atraumatic.     Mouth/Throat:     Pharynx: No oropharyngeal exudate.  Eyes:     General: No scleral icterus.    Extraocular Movements: Extraocular movements intact.     Conjunctiva/sclera: Conjunctivae normal.     Pupils: Pupils are equal, round, and reactive to light.  Cardiovascular:     Rate and Rhythm: Normal rate and regular rhythm.     Heart sounds: Normal heart sounds. No murmur heard.  No friction rub. No gallop.   Pulmonary:     Effort: Pulmonary effort is normal.     Breath sounds: Normal breath sounds. No wheezing, rhonchi or rales.  Musculoskeletal:     Cervical back: Neck supple.  Skin:    General: Skin is warm and dry.  Neurological:     Mental Status: She is alert and oriented to person, place, and time.     No results found for this or any previous visit (from the past 24 hour(s)).  No results found.   ASSESSMENT and PLAN  1. Cough Discussed most likely postviral. If covid (mild presentation), she has completed qurantine period, discussed supportive measures and RTC precautions.  - albuterol (VENTOLIN HFA) 108 (90 Base) MCG/ACT inhaler; Inhale 2 puffs into the lungs every 6 (six) hours as needed for wheezing or shortness of breath. - Novel Coronavirus, NAA ([facility])  Other orders - promethazine-dextromethorphan (PROMETHAZINE-DM) 6.25-15 MG/5ML syrup; Take 5 mLs by mouth 4 (four) times daily as needed for cough. - benzonatate (TESSALON) 100 MG capsule; Take 1-2 capsules (100-200 mg total) by mouth 3 (three) times daily as needed for cough.  Return for as scheduled.    Rutherford Guys, MD Primary Care at  [place], [place] [address] Ph.  [phone] Fax [phone]

## 2020-03-20 ENCOUNTER — Other Ambulatory Visit: Payer: Self-pay | Admitting: Family Medicine

## 2020-03-20 NOTE — Telephone Encounter (Signed)
Requested Prescriptions  Pending Prescriptions Disp Refills   DULoxetine (CYMBALTA) 60 MG capsule [Pharmacy Med Name: DULOXETINE HCL DR 60 MG CAP] 90 capsule 1    Sig: TAKE 1 CAPSULE BY MOUTH EVERY DAY     Psychiatry: Antidepressants - SNRI Failed - 03/20/2020  1:49 AM      Failed - Last BP in normal range    BP Readings from Last 1 Encounters:  03/13/20 140/90         Passed - Valid encounter within last 6 months    Recent Outpatient Visits          1 week ago Cough   [facility], Lilia Argue, MD   2 weeks ago Cough   [facility] at [place], NP   2 weeks ago Type 2 diabetes mellitus with hyperglycemia, with long-term current use of insulin [facility])   Primary Care at [facility], Ines Bloomer, MD   1 month ago Seasonal allergies   [facility], Lilia Argue, MD   2 months ago Lower respiratory tract infection   [facility], Lilia Argue, MD      Future Appointments            In 1 month Rutherford Guys, MD Primary Care at [place], [facility]            metFORMIN (GLUCOPHAGE) 500 MG tablet [Pharmacy Med Name: METFORMIN HCL 500 MG TABLET] 360 tablet 1    Sig: TAKE 1 TAB BY MOUTH 2 TIMES DAILY WITH A MEAL FOR TWO WEEKS. THEN TAKE 2 TABLETS TWICE DAILY.     Endocrinology:  Diabetes - Biguanides Failed - 03/20/2020  1:49 AM      Failed - HBA1C is between 0 and 7.9 and within 180 days    Hgb A1c MFr Bld  Date Value Ref Range Status  03/02/2020 8.0 (H) 4.8 - 5.6 % Final    Comment:             Prediabetes: 5.7 - 6.4          Diabetes: >6.4          Glycemic control for adults with diabetes: <7.0          Passed - Cr in normal range and within 360 days    Creatinine  Date Value Ref Range Status  09/23/2017 0.78 0.60 - 1.10 mg/dL Final   Creatinine, Ser  Date Value Ref Range Status  03/02/2020 0.82 0.57 - 1.00 mg/dL Final         Passed - eGFR in normal range and within 360 days    GFR, Est AFR Am  Date Value Ref  Range Status  09/23/2017 >60 >60 mL/min Final    Comment:    (NOTE) The eGFR has been calculated using the CKD EPI equation. This calculation has not been validated in all clinical situations. eGFR's persistently <60 mL/min signify possible Chronic Kidney Disease.    GFR calc Af Amer  Date Value Ref Range Status  03/02/2020 91 >59 mL/min/1.73 Final    Comment:    **[facility] currently reports eGFR in compliance with the current**   recommendations of the [employer]. [facility] will   update reporting as new guidelines are published from the NKF-ASN   Task force.    GFR, Est Non Af Am  Date Value Ref Range Status  09/23/2017 >60 >60 mL/min Final   GFR calc non Af Wyvonnia Lora  Date Value Ref Range Status  03/02/2020 79 >59 mL/min/1.73 Final   GFR  Date Value Ref Range Status  08/16/2019 96.19 >60.00 mL/min Final         Passed - Valid encounter within last 6 months    Recent Outpatient Visits          1 week ago Cough   [facility], Lilia Argue, MD   2 weeks ago Cough   Primary Care at [place], NP   2 weeks ago Type 2 diabetes mellitus with hyperglycemia, with long-term current use of insulin [facility])   Primary Care at [facility], Ines Bloomer, MD   1 month ago Seasonal allergies   [facility], Lilia Argue, MD   2 months ago Lower respiratory tract infection   [facility], Lilia Argue, MD      Future Appointments            In 1 month Rutherford Guys, MD Primary Care at [place], [facility]

## 2020-04-13 ENCOUNTER — Telehealth: Payer: Self-pay | Admitting: Family Medicine

## 2020-04-13 ENCOUNTER — Encounter: Payer: Medicare Other | Attending: Family Medicine | Admitting: Dietician

## 2020-04-13 ENCOUNTER — Other Ambulatory Visit: Payer: Self-pay

## 2020-04-13 ENCOUNTER — Encounter: Payer: Self-pay | Admitting: Dietician

## 2020-04-13 DIAGNOSIS — E118 Type 2 diabetes mellitus with unspecified complications: Secondary | ICD-10-CM | POA: Insufficient documentation

## 2020-04-13 DIAGNOSIS — IMO0002 Reserved for concepts with insufficient information to code with codable children: Secondary | ICD-10-CM

## 2020-04-13 DIAGNOSIS — E1165 Type 2 diabetes mellitus with hyperglycemia: Secondary | ICD-10-CM | POA: Insufficient documentation

## 2020-04-13 NOTE — Patient Instructions (Addendum)
Take your vitamin with food (not on an empty stomach as this can cause nausea).  Continue to take your medications as prescribed.  Choose 3-4 carbohydrate choices per meal Choose 0-1 carbohydrate choices per snack

## 2020-04-13 NOTE — Progress Notes (Signed)
Diabetes Self-Management Education  Visit Type: First/Initial  Appt. Start Time: 1045 Appt. End Time: 1200  04/13/2020  [name], identified by name and date of birth, is a 59 y.o. female with a diagnosis of Diabetes: Type 2.   ASSESSMENT Patient is here today alone.  She would like to learn how to better control her blood sugar.  Patient states that she has a history of Ulcerative Colitis and whenever she was placed on prednisone she was put on insulin.  Diabetes was diagnosed about 3 years ago with further increases of blood glucose. She states that she has mild chronic nausea that worsens when she eats. Fruit and anything with sugar causes sugar. She reports no diagnosis of gastroparesis. Vomits in the evening many nights. Tolerates small meals best. She does not eat a lot of red meat due to the colitis.  She tolerates chicken and fish. Tolerates dairy but with increased gas.  Buys Lactaid which helps. She has an air fryer but has not learned how to cook using this.  History includes Type 2 Diabetes, OSA (just diagnosed and to pick up a c-pap), fibromyalgia, ulcerative colitis A1C 8% 03/02/2020 decreased from 9.3% 02/02/2020 Medications include:  Basaglar 45 units q HS, Novolog 25 units before each meal, Metformin (sometimes skips as this increases nausea)  Weight hx:   167 lbs end of May 2021 decreased to 147 lbs with illness in May. 154 lbs 04/13/2020  Patient lives with her husband and daughter and moved from [place] about 3 years ago.  She worked for the [employer].   She does the shopping and her and her daughter share the cooking.  Height 5' 2"  (1.575 m), weight 154 lb (69.9 kg). Body mass index is 28.17 kg/m.   Diabetes Self-Management Education - 04/13/20 1104      Visit Information   Visit Type First/Initial      Initial Visit   Diabetes Type Type 2    Are you currently following a meal plan? No    Are you taking your medications  as prescribed? Yes    Date Diagnosed 2018      Health Coping   How would you rate your overall health? Fair      Psychosocial Assessment   Patient Belief/Attitude about Diabetes Motivated to manage diabetes    Self-care barriers None    Self-management support Doctor's office;Family    Other persons present Patient    Patient Concerns Nutrition/Meal planning    Special Needs None    Preferred Learning Style No preference indicated    Learning Readiness Ready    How often do you need to have someone help you when you read instructions, pamphlets, or other written materials from your doctor or pharmacy? 1 - Never    What is the last grade level you completed in school? 11      Pre-Education Assessment   Patient understands the diabetes disease and treatment process. Needs Instruction    Patient understands incorporating nutritional management into lifestyle. Needs Instruction    Patient undertands incorporating physical activity into lifestyle. Needs Instruction    Patient understands using medications safely. Needs Instruction    Patient understands monitoring blood glucose, interpreting and using results Needs Instruction    Patient understands prevention, detection, and treatment of acute complications. Needs Instruction    Patient understands prevention, detection, and treatment of chronic complications. Needs Instruction    Patient understands how to develop strategies to address psychosocial  issues. Needs Instruction    Patient understands how to develop strategies to promote health/change behavior. Needs Instruction      Complications   Last HgB A1C per patient/outside source 8 %   03/02/2020 decreased from 9.3% 02/02/2020   How often do you check your blood sugar? 1-2 times/day    Fasting Blood glucose range (mg/dL) >200;130-179;180-200    Postprandial Blood glucose range (mg/dL) 130-179    Number of hypoglycemic episodes per month 0   treats lows with juice or glucose tabs    Number of hyperglycemic episodes per week 6    Can you tell when your blood sugar is high? Yes    What do you do if your blood sugar is high? adds exra novolog    Have you had a dilated eye exam in the past 12 months? Yes    Have you had a dental exam in the past 12 months? No    Are you checking your feet? Yes    How many days per week are you checking your feet? 7      Dietary Intake   Breakfast oatmeal with raisins OR 2 eggs, [place] sausage, 2 slices white or [employer] toast with butter and jam   after 10   Snack (morning) none    Lunch chicken sandwich from [employer] or home (baked or fried) or baked fish, grits    Snack (afternoon) spicy potato chips or cookies "junk food" or fruit (apple, tangerine, applesauce)    Dinner stirfried chicken, broccoli, cauliflower, rice    Snack (evening) gingersnap cookies (full size- about 8)    Beverage(s) water, mio, small amount of sweet tea, occasional gingerale      Exercise   Exercise Type ADL's      Patient Education   Previous Diabetes Education No    Disease state  Definition of diabetes, type 1 and 2, and the diagnosis of diabetes    Nutrition management  Role of diet in the treatment of diabetes and the relationship between the three main macronutrients and blood glucose level;Food label reading, portion sizes and measuring food.;Carbohydrate counting;Meal options for control of blood glucose level and chronic complications.    Physical activity and exercise  Role of exercise on diabetes management, blood pressure control and cardiac health.    Medications Reviewed patients medication for diabetes, action, purpose, timing of dose and side effects.    Monitoring Taught/discussed recording of test results and interpretation of SMBG.;Identified appropriate SMBG and/or A1C goals.;Yearly dilated eye exam    Acute complications Taught treatment of hypoglycemia - the 15 rule.    Chronic complications Relationship between chronic complications and blood  glucose control    Psychosocial adjustment Worked with patient to identify barriers to care and solutions;Role of stress on diabetes;Identified and addressed patients feelings and concerns about diabetes      Individualized Goals (developed by patient)   Nutrition Follow meal plan discussed    Physical Activity Exercise 3-5 times per week;30 minutes per day    Medications take my medication as prescribed    Monitoring  test my blood glucose as discussed    Reducing Risk examine blood glucose patterns;do foot checks daily;increase portions of healthy fats    Health Coping discuss diabetes with (comment)   MD, RD, CDCES     Post-Education Assessment   Patient understands the diabetes disease and treatment process. Demonstrates understanding / competency    Patient understands incorporating nutritional management into lifestyle. Needs Review  Patient undertands incorporating physical activity into lifestyle. Needs Review    Patient understands using medications safely. Demonstrates understanding / competency    Patient understands monitoring blood glucose, interpreting and using results Demonstrates understanding / competency    Patient understands prevention, detection, and treatment of acute complications. Demonstrates understanding / competency    Patient understands prevention, detection, and treatment of chronic complications. Demonstrates understanding / competency    Patient understands how to develop strategies to address psychosocial issues. Needs Review    Patient understands how to develop strategies to promote health/change behavior. Needs Review      Outcomes   Expected Outcomes Demonstrated interest in learning. Expect positive outcomes    Future DMSE 4-6 wks    Program Status Not Completed           Individualized Plan for Diabetes Self-Management Training:   Learning Objective:  Patient will have a greater understanding of diabetes self-management. Patient education  plan is to attend individual and/or group sessions per assessed needs and concerns.   Plan:   Patient Instructions  Take your vitamin with food (not on an empty stomach as this can cause nausea).  Continue to take your medications as prescribed.  Choose 3-4 carbohydrate choices per meal Choose 0-1 carbohydrate choices per snack      Expected Outcomes:  Demonstrated interest in learning. Expect positive outcomes  Education material provided: ADA - How to Thrive: A Guide for Your Journey with Diabetes ; meal plan card, snack list If problems or questions, patient to contact team via:  Phone  Future DSME appointment: 4-6 wks

## 2020-04-13 NOTE — Telephone Encounter (Signed)
Pt would like to schedule appt, message was directed to clinical pool by mistake

## 2020-04-13 NOTE — Telephone Encounter (Signed)
Patient is calling to schedule appt with schedule Dr. Deborha Payment- [phone]

## 2020-04-19 ENCOUNTER — Telehealth (INDEPENDENT_AMBULATORY_CARE_PROVIDER_SITE_OTHER): Payer: Medicare Other | Admitting: Family Medicine

## 2020-04-19 ENCOUNTER — Other Ambulatory Visit: Payer: Self-pay

## 2020-04-19 ENCOUNTER — Encounter: Payer: Self-pay | Admitting: Family Medicine

## 2020-04-19 VITALS — BP 133/88

## 2020-04-19 DIAGNOSIS — R519 Headache, unspecified: Secondary | ICD-10-CM | POA: Diagnosis not present

## 2020-04-19 DIAGNOSIS — E11649 Type 2 diabetes mellitus with hypoglycemia without coma: Secondary | ICD-10-CM

## 2020-04-19 DIAGNOSIS — K5100 Ulcerative (chronic) pancolitis without complications: Secondary | ICD-10-CM

## 2020-04-19 MED ORDER — ONDANSETRON 8 MG PO TBDP: 8.0000 mg | ORAL_TABLET | Freq: Three times a day (TID) | ORAL | 3 refills | Status: DC | PRN

## 2020-04-19 MED ORDER — TRIAMCINOLONE ACETONIDE 0.1 % EX CREA: 1.0000 "application " | TOPICAL_CREAM | Freq: Two times a day (BID) | CUTANEOUS | 2 refills | Status: AC

## 2020-04-19 NOTE — Progress Notes (Signed)
Virtual Visit Note  I connected with patient on 04/19/20 at 516pm by phone (technical difficulties) and verified that I am speaking with the correct person using two identifiers. Beverly Ramirez is currently located at home and patient is currently with them during visit. The provider, Rutherford Guys, MD is located in their office at time of visit.  I discussed the limitations, risks, security and privacy concerns of performing an evaluation and management service by telephone and the availability of in person appointments. I also discussed with the patient that there may be a patient responsible charge related to this service. The patient expressed understanding and agreed to proceed.   I provided 25 minutes of non-face-to-face time during this encounter.  Chief Complaint  Patient presents with  . colitis symptoms    sore mouth, blisters   . nausea / headaches    x 4 days   . Diabetes    low blood sugars reading at home     HPI ? tx with swish and spit dexa/lidocaine by GI a month ago for aphthous ulcers  She has been having lightheaded, nausea She started having occipital headaches x 4 days, mostly when she goes to bed and when wakes up - during the day she has no headaches, she reports that neck muscles are tight No vision changes, no vomiting, no focal weakness This cbgs have been reading low in her glucometer - ate orange juice and chocolate Yesterday her fasting was 98 She has not been doing insulin (novolog) She did not use lantus last night She has not been takin metformin due to liver concerns Night before she did use 45 units yesterday Currently her cbg 140 She has not been eating normal due to colitis She reports that oral ulcers have not really resolved, they keep coming back  Has an appt with GI on aug 20th She has not gotten cpap  Allergies  Allergen Reactions  . Codeine Itching, Rash and Hives  . Latex Hives    With the power    Prior to Admission  medications   Medication Sig Start Date End Date Taking? Authorizing Provider  acetaminophen (TYLENOL) 500 MG tablet Take 500 mg by mouth every 6 (six) hours as needed for mild pain or headache.    Yes [provider]  albuterol (VENTOLIN HFA) 108 (90 Base) MCG/ACT inhaler Inhale 2 puffs into the lungs every 6 (six) hours as needed for wheezing or shortness of breath. 03/13/20  Yes Rutherford Guys, MD  amLODipine (NORVASC) 5 MG tablet Take 5 mg by mouth at bedtime.  12/15/19  Yes [provider]  atorvastatin (LIPITOR) 40 MG tablet Take 1 tablet (40 mg total) by mouth daily. 02/10/20  Yes Rutherford Guys, MD  benzonatate (TESSALON) 100 MG capsule Take 1-2 capsules (100-200 mg total) by mouth 3 (three) times daily as needed for cough. 03/13/20  Yes Rutherford Guys, MD  cyclobenzaprine (FLEXERIL) 10 MG tablet Take 1 tablet (10 mg total) by mouth 3 (three) times daily as needed for muscle spasms. 09/26/19  Yes Rutherford Guys, MD  dexamethasone 0.5 MG/5ML elixir Take 5 mLs (0.5 mg total) by mouth 4 (four) times daily. Swish and spit, hold in mouth 5 minutes each time 03/09/20  Yes Gatha Mayer, MD  DULoxetine (CYMBALTA) 60 MG capsule TAKE 1 CAPSULE BY MOUTH EVERY DAY 03/20/20  Yes Rutherford Guys, MD  fluticasone (FLONASE) 50 MCG/ACT nasal spray SPRAY 2 SPRAYS INTO EACH NOSTRIL EVERY DAY  03/08/20  Yes Rutherford Guys, MD  glucose blood [facility] VERIO) test strip Use as instructed 12/16/19  Yes Rutherford Guys, MD  insulin aspart (NOVOLOG FLEXPEN) 100 UNIT/ML FlexPen Inject 15 Units into the skin 3 (three) times daily with meals. 12/26/19  Yes Rutherford Guys, MD  Insulin Glargine [facility]) 100 UNIT/ML Inject 0.4 mLs (40 Units total) into the skin at bedtime. 12/26/19  Yes Rutherford Guys, MD  Insulin Syringe-Needle U-100 (INSULIN SYRINGE 1CC/31GX5/16") 31G X 5/16" 1 ML MISC Inject 0.08 ( 8 Units)  into the skin 3 times daily before meals. 10/16/18  Yes Rutherford Guys, MD  Lancets  (ONETOUCH DELICA PLUS XBMWUX32G) [place] 1 each by Does not apply route 3 (three) times daily. Dx E11.65, z79.4 01/24/19  Yes Rutherford Guys, MD  lidocaine (XYLOCAINE) 2 % solution Use as directed 15 mLs in the mouth or throat as needed for mouth pain. 03/09/20  Yes Gatha Mayer, MD  metFORMIN (GLUCOPHAGE) 500 MG tablet TAKE 1 TAB BY MOUTH 2 TIMES DAILY WITH A MEAL FOR TWO WEEKS. THEN TAKE 2 TABLETS TWICE DAILY. 03/20/20  Yes Rutherford Guys, MD  montelukast (SINGULAIR) 10 MG tablet Take 1 tablet (10 mg total) by mouth at bedtime. 03/02/20  Yes Maximiano Coss, NP  Multiple Vitamins-Minerals (WOMENS 50+ MULTI VITAMIN/MIN PO) Take by mouth.   Yes [provider]  NONFORMULARY OR COMPOUNDED ITEM Swish and spit 5 mLs as needed (ulcer flare up in mouth). Magic mouthwash with lidocaine   Yes [provider]  Olopatadine HCl 0.2 % SOLN Apply 1 drop to eye daily. 02/02/20  Yes Rutherford Guys, MD  promethazine-dextromethorphan (PROMETHAZINE-DM) 6.25-15 MG/5ML syrup Take 5 mLs by mouth 4 (four) times daily as needed for cough. 03/13/20  Yes Rutherford Guys, MD  mesalamine (LIALDA) 1.2 g EC tablet Take 2 tablets (2.4 g total) by mouth 2 (two) times daily. Patient taking differently: Take 4.8 g by mouth daily with breakfast.  11/25/18 02/02/20  Gatha Mayer, MD    Past Medical History:  Diagnosis Date  . Anemia   . Carpal tunnel syndrome    bilateral  . Chronic heel pain   . Diabetes mellitus without complication ([place])   . DJD (degenerative joint disease) of cervical spine    MRI 2017  . Hyperlipidemia   . Plantar fasciitis    bilateral  . Sickle cell anemia (HCC)    sickle cell trait  . Sleep apnea   . Tendonitis    left hand/wrist  . UC (ulcerative colitis) ([place])     Past Surgical History:  Procedure Laterality Date  . ABDOMINAL HYSTERECTOMY    . APPENDECTOMY    . CESAREAN SECTION     x4  . COLONOSCOPY     Multiple in [place]  . ESOPHAGOGASTRODUODENOSCOPY       Social History   Tobacco Use  . Smoking status: Former Smoker    Types: Cigarettes    Quit date: 10/13/2006    Years since quitting: 13.5  . Smokeless tobacco: Never Used  Substance Use Topics  . Alcohol use: Yes    Comment: occasional    Family History  Problem Relation Age of Onset  . Stroke Mother   . Diabetes Mother   . Hypertension Mother   . Lung cancer Mother   . Sickle cell trait Mother   . Diabetes Maternal Aunt   . Lung cancer Maternal Aunt   . Lung cancer Maternal  Aunt   . Lung cancer Maternal Aunt   . Colon cancer Cousin 19  . Sickle cell trait Father   . Sickle cell anemia Sister 14  . Lung cancer Maternal Grandmother   . Sickle cell anemia Sister   . Other Sister        accident and blood clot formed    ROS Per hpi  Objective  Vitals as reported by the patient: per above  Gen: aaox3, nad   ASSESSMENT and PLAN  1. Hypoglycemia associated with type 2 diabetes mellitus (HCC) Concerning degress of hypoglycemia. Stop lantus (only mediation she is still taking) bring in glucometer to compare with ours. Cont checking fasting blood glucose. Goal 90-130.  - Hemoglobin A1c; Future - Comprehensive metabolic panel; Future  2. Ulcerative pancolitis without complication ([place]) Reach out to GI for sooner appt - Sedimentation Rate; Future - C-reactive protein; Future  3. Acute nonintractable headache, unspecified headache type Flexeril at bedtime, call cpap company for machine  Other orders - triamcinolone cream (KENALOG) 0.1 %; Apply 1 application topically 2 (two) times daily. - ondansetron (ZOFRAN-ODT) 8 MG disintegrating tablet; Take 1 tablet (8 mg total) by mouth every 8 (eight) hours as needed for nausea or vomiting.  FOLLOW-UP: 1 week   The above assessment and management plan was discussed with the patient. The patient verbalized understanding of and has agreed to the management plan. Patient is aware to call the clinic if symptoms persist or  worsen. Patient is aware when to return to the clinic for a follow-up visit. Patient educated on when it is appropriate to go to the emergency department.     Rutherford Guys, MD Primary Care at [address] [place], [place] [address] Ph.  [phone] Fax [phone]

## 2020-04-19 NOTE — Patient Instructions (Signed)
° ° ° °  If you have lab work done today you will be contacted with your lab results within the next 2 weeks.  If you have not heard from us then please contact us. The fastest way to get your results is to register for My Chart. ° ° °IF you received an x-ray today, you will receive an invoice from [facility]. Please contact [facility] at [phone] with questions or concerns regarding your invoice.  ° °IF you received labwork today, you will receive an invoice from [facility]. Please contact [facility] at [phone] with questions or concerns regarding your invoice.  ° °Our billing staff will not be able to assist you with questions regarding bills from these companies. ° °You will be contacted with the lab results as soon as they are available. The fastest way to get your results is to activate your My Chart account. Instructions are located on the last page of this paperwork. If you have not heard from us regarding the results in 2 weeks, please contact this office. °  ° ° ° °

## 2020-04-20 ENCOUNTER — Ambulatory Visit: Payer: Medicare Other

## 2020-04-20 DIAGNOSIS — K5100 Ulcerative (chronic) pancolitis without complications: Secondary | ICD-10-CM

## 2020-04-20 DIAGNOSIS — E11649 Type 2 diabetes mellitus with hypoglycemia without coma: Secondary | ICD-10-CM | POA: Diagnosis not present

## 2020-04-21 LAB — HEMOGLOBIN A1C
Est. average glucose Bld gHb Est-mCnc: 157 mg/dL
Hgb A1c MFr Bld: 7.1 % — ABNORMAL HIGH (ref 4.8–5.6)

## 2020-04-21 LAB — COMPREHENSIVE METABOLIC PANEL
ALT: 36 IU/L — ABNORMAL HIGH (ref 0–32)
AST: 29 IU/L (ref 0–40)
Albumin/Globulin Ratio: 1.2 (ref 1.2–2.2)
Albumin: 3.8 g/dL (ref 3.8–4.9)
Alkaline Phosphatase: 134 IU/L — ABNORMAL HIGH (ref 48–121)
BUN/Creatinine Ratio: 12 (ref 9–23)
BUN: 11 mg/dL (ref 6–24)
Bilirubin Total: 0.5 mg/dL (ref 0.0–1.2)
CO2: 25 mmol/L (ref 20–29)
Calcium: 9.4 mg/dL (ref 8.7–10.2)
Chloride: 105 mmol/L (ref 96–106)
Creatinine, Ser: 0.9 mg/dL (ref 0.57–1.00)
GFR calc Af Amer: 82 mL/min/{1.73_m2} (ref >59)
GFR calc non Af Amer: 71 mL/min/{1.73_m2} (ref >59)
Globulin, Total: 3.1 g/dL (ref 1.5–4.5)
Glucose: 127 mg/dL — ABNORMAL HIGH (ref 65–99)
Potassium: 4 mmol/L (ref 3.5–5.2)
Sodium: 142 mmol/L (ref 134–144)
Total Protein: 6.9 g/dL (ref 6.0–8.5)

## 2020-04-21 LAB — SEDIMENTATION RATE: Sed Rate: 13 mm/hr (ref 0–40)

## 2020-04-21 LAB — C-REACTIVE PROTEIN: CRP: 3 mg/L (ref 0–10)

## 2020-04-23 ENCOUNTER — Ambulatory Visit: Payer: Medicare Other | Admitting: Family Medicine

## 2020-04-27 ENCOUNTER — Encounter: Payer: Self-pay | Admitting: Internal Medicine

## 2020-04-27 ENCOUNTER — Ambulatory Visit (INDEPENDENT_AMBULATORY_CARE_PROVIDER_SITE_OTHER): Payer: Medicare Other | Admitting: Internal Medicine

## 2020-04-27 VITALS — BP 112/70 | HR 83 | Ht 62.0 in | Wt 154.0 lb

## 2020-04-27 DIAGNOSIS — Z9114 Patient's other noncompliance with medication regimen: Secondary | ICD-10-CM | POA: Diagnosis not present

## 2020-04-27 DIAGNOSIS — K51919 Ulcerative colitis, unspecified with unspecified complications: Secondary | ICD-10-CM

## 2020-04-27 DIAGNOSIS — K120 Recurrent oral aphthae: Secondary | ICD-10-CM | POA: Diagnosis not present

## 2020-04-27 MED ORDER — NONFORMULARY OR COMPOUNDED ITEM: 5.0000 mL | 1 refills | Status: DC | PRN

## 2020-04-27 MED ORDER — DIPHENOXYLATE-ATROPINE 2.5-0.025 MG PO TABS: 1.0000 | ORAL_TABLET | Freq: Four times a day (QID) | ORAL | 0 refills | Status: DC | PRN

## 2020-04-27 NOTE — Patient Instructions (Addendum)
Your provider has requested that you go to the basement level for lab work before leaving today. Press "B" on the elevator. The lab is located at the first door on the left as you exit the elevator.   Normal BMI (Body Mass Index- based on height and weight) is between 19 and 25. Your BMI today is Body mass index is 28.17 kg/m. Marland Kitchen Please consider follow up  regarding your BMI with your Primary Care Provider.   We have sent the following medications to your pharmacy for you to pick up at your convenience: Lomotil, Magic Mouth wash, (faxed to [facility])   I appreciate the opportunity to care for you. Silvano Rusk, MD, [facility]

## 2020-04-27 NOTE — Progress Notes (Signed)
Beverly Ramirez 59 y.o. April 12, 1961 [id number]  Assessment & Plan:   Encounter Diagnoses  Name Primary?  . Ulcerative colitis with complication, unspecified location ([place]) Yes  . Recurrent aphthous stomatitis   . Nonadherence to medication    Stool studies as below to see if there is inflammation ongoing.   Refill Magic mouthwash with lidocaine for aphthous ulcers.  Consider ENT or other evaluation for help as this is not in my typical area of expertise.  It could be a manifestation of a flare of inflammatory bowel disease but it could be separate as well.  I do not think she has had vaginal ulcerations but I should clarify that on follow-up.  She could have Behcet's disease. Orders Placed This Encounter  Procedures  . Calprotectin, Fecal  . Fecal leukocytes    I will contact her to restart mesalamine and I have also prescribed Lomotil for symptomatic relief.  We will also get her a follow-up for October to have one on the books I appreciate the opportunity to care for this patient. CC: Rutherford Guys, MD   Subjective:   Chief Complaint: Diarrhea and mouth sores  HPI Beverly Ramirez is here for follow-up she has a history of ulcerative colitis diagnosed in [place], the last colonoscopy she had there showed microscopic colitis in the a sending only in 2017, she was diagnosed during hospitalization in 2007.  Colonoscopy by me in 2020 with colitis both in endoscopic and really pathologic remission with some quiesced sent changes and mild chronic colitis and a few biopsies.  She had previously been on Biologics came off of them and has been on Lialda 4.8 g daily but chart review after she left reveal she has stopped taking that and I missed that when she was here.  Insurance and cost issues have been a barrier to restarting Biologics.  Previously treated with 6-MP,  Humira and Entyvio in [place]  She is complaining of a lot of abdominal soreness and cramps and 3 loose to watery  stools a day.  No recent antibiotics.  She been having mouth ulcers they flared and she was prescribed Magic mouthwash with lidocaine which has helped, she had sent me some pictures hrough my chart and have treated her.  Mouth ulcers are flaring again.   CMET sed rate August 13 on PCP visit with glucose 127 alk phos slightly up to 134 down from 176 in June ALT 36 otherwise all normal in June hemoglobin hematocrit normal white count mildly elevated at 12.7 normal platelets  Allergies  Allergen Reactions  . Codeine Itching, Rash and Hives  . Latex Hives    With the power   Current Meds  Medication Sig  . albuterol (VENTOLIN HFA) 108 (90 Base) MCG/ACT inhaler Inhale 2 puffs into the lungs every 6 (six) hours as needed for wheezing or shortness of breath.  Marland Kitchen amLODipine (NORVASC) 5 MG tablet Take 5 mg by mouth at bedtime.   Marland Kitchen atorvastatin (LIPITOR) 40 MG tablet Take 1 tablet (40 mg total) by mouth daily.  . cyclobenzaprine (FLEXERIL) 10 MG tablet Take 1 tablet (10 mg total) by mouth 3 (three) times daily as needed for muscle spasms.  . DULoxetine (CYMBALTA) 60 MG capsule TAKE 1 CAPSULE BY MOUTH EVERY DAY  . fluticasone (FLONASE) 50 MCG/ACT nasal spray SPRAY 2 SPRAYS INTO EACH NOSTRIL EVERY DAY  . glucose blood (ONETOUCH VERIO) test strip Use as instructed  . insulin aspart (NOVOLOG FLEXPEN) 100 UNIT/ML FlexPen Inject 15 Units  into the skin 3 (three) times daily with meals.  . Insulin Glargine (BASAGLAR KWIKPEN) 100 UNIT/ML Inject 0.4 mLs (40 Units total) into the skin at bedtime.  . Insulin Syringe-Needle U-100 (INSULIN SYRINGE 1CC/31GX5/16") 31G X 5/16" 1 ML MISC Inject 0.08 ( 8 Units)  into the skin 3 times daily before meals.  . Lancets (ONETOUCH DELICA PLUS [id number]) MISC 1 each by Does not apply route 3 (three) times daily. Dx E11.65, z79.4  . lidocaine (XYLOCAINE) 2 % solution Use as directed 15 mLs in the mouth or throat as needed for mouth pain.  . montelukast (SINGULAIR) 10 MG tablet  Take 1 tablet (10 mg total) by mouth at bedtime.  . Multiple Vitamins-Minerals (WOMENS 50+ MULTI VITAMIN/MIN PO) Take by mouth.  . NONFORMULARY OR COMPOUNDED ITEM Swish and spit 5 mLs as needed (ulcer flare up in mouth). Magic mouthwash with lidocaine disp#150 ml  . ondansetron (ZOFRAN-ODT) 8 MG disintegrating tablet Take 1 tablet (8 mg total) by mouth every 8 (eight) hours as needed for nausea or vomiting.  . triamcinolone cream (KENALOG) 0.1 % Apply 1 application topically 2 (two) times daily.  . [DISCONTINUED] NONFORMULARY OR COMPOUNDED ITEM Swish and spit 5 mLs as needed (ulcer flare up in mouth). Magic mouthwash with lidocaine   Past Medical History:  Diagnosis Date  . Anemia   . Carpal tunnel syndrome    bilateral  . Chronic heel pain   . Diabetes mellitus without complication ([place])   . DJD (degenerative joint disease) of cervical spine    MRI 2017  . Hyperlipidemia   . Plantar fasciitis    bilateral  . Sickle cell anemia (HCC)    sickle cell trait  . Sleep apnea   . Tendonitis    left hand/wrist  . UC (ulcerative colitis) ([place])    Past Surgical History:  Procedure Laterality Date  . ABDOMINAL HYSTERECTOMY    . APPENDECTOMY    . CESAREAN SECTION     x4  . COLONOSCOPY     Multiple in [place]  . ESOPHAGOGASTRODUODENOSCOPY     Social History   Social History Narrative   She is married, 3 sons born 1979, 1981, 1984.  One daughter born in 19.   She is medically retired from the [employer] after a fall and a hip injury, around 2004.   Caffeinated beverage every other day x1   07/13/2017   family history includes Colon cancer (age of onset: 29) in her cousin; Diabetes in her maternal aunt and mother; Hypertension in her mother; Lung cancer in her maternal aunt, maternal aunt, maternal aunt, maternal grandmother, and mother; Other in her sister; Sickle cell anemia in her sister; Sickle cell anemia (age of onset: 59) in her sister; Sickle cell trait  in her father and mother; Stroke in her mother.   Review of Systems As above  Objective:   Physical Exam BP 112/70   Pulse 83   Ht _0  (1.575 m)   Wt 154 lb (69.9 kg)   BMI 28.17 kg/m  Mouth ulcers multiple small after a side of the tongue oral mucosa under the lips abd diffusely tender but soft and benign overall

## 2020-04-28 MED ORDER — MESALAMINE 1.2 G PO TBEC: 2.4000 g | DELAYED_RELEASE_TABLET | Freq: Two times a day (BID) | ORAL | 3 refills | Status: DC

## 2020-04-30 ENCOUNTER — Other Ambulatory Visit: Payer: Medicare Other

## 2020-04-30 ENCOUNTER — Telehealth: Payer: Self-pay

## 2020-04-30 DIAGNOSIS — K51919 Ulcerative colitis, unspecified with unspecified complications: Secondary | ICD-10-CM

## 2020-04-30 DIAGNOSIS — K120 Recurrent oral aphthae: Secondary | ICD-10-CM

## 2020-04-30 NOTE — Telephone Encounter (Signed)
Patient informed , she had just run out no other reason for stopping the lialda.

## 2020-04-30 NOTE — Telephone Encounter (Signed)
-----   Message from Gatha Mayer, MD sent at 04/28/2020  3:51 PM EDT ----- Regarding: please tell her to restart Lialda I missed that she had run out of her generic Lialda when she was in the office on Friday.  I sent a prescription with you call her and tell her to restart it that is probably why she is having diarrhea and flaring.  Let me know if she stopped it for other reasons and tell her I am sorry that I did not pick up on that when she was here

## 2020-05-03 LAB — CALPROTECTIN, FECAL: Calprotectin, Fecal: 743 ug/g — ABNORMAL HIGH (ref 0–120)

## 2020-05-03 LAB — FECAL LEUKOCYTES
Micro Number: 10859111
Result: NOT DETECTED
Specimen Quality: ADEQUATE

## 2020-05-04 ENCOUNTER — Other Ambulatory Visit: Payer: Self-pay

## 2020-05-04 ENCOUNTER — Ambulatory Visit (INDEPENDENT_AMBULATORY_CARE_PROVIDER_SITE_OTHER): Payer: Medicare Other | Admitting: Family Medicine

## 2020-05-04 ENCOUNTER — Encounter: Payer: Self-pay | Admitting: Family Medicine

## 2020-05-04 ENCOUNTER — Telehealth: Payer: Self-pay | Admitting: Internal Medicine

## 2020-05-04 VITALS — BP 140/90 | HR 92 | Temp 97.6°F | Ht 62.0 in | Wt 153.4 lb

## 2020-05-04 DIAGNOSIS — Z23 Encounter for immunization: Secondary | ICD-10-CM

## 2020-05-04 DIAGNOSIS — E11649 Type 2 diabetes mellitus with hypoglycemia without coma: Secondary | ICD-10-CM | POA: Diagnosis not present

## 2020-05-04 DIAGNOSIS — Z794 Long term (current) use of insulin: Secondary | ICD-10-CM

## 2020-05-04 DIAGNOSIS — E1165 Type 2 diabetes mellitus with hyperglycemia: Secondary | ICD-10-CM

## 2020-05-04 MED ORDER — NOVOLOG FLEXPEN 100 UNIT/ML ~~LOC~~ SOPN
20.0000 [IU] | PEN_INJECTOR | Freq: Three times a day (TID) | SUBCUTANEOUS | 11 refills | Status: DC
Start: 2020-05-04 — End: 2020-07-23

## 2020-05-04 MED ORDER — NONFORMULARY OR COMPOUNDED ITEM: 5.0000 mL | 1 refills | Status: DC | PRN

## 2020-05-04 MED ORDER — PEN NEEDLES 32G X 6 MM MISC: 11 refills | Status: DC

## 2020-05-04 MED ORDER — ONETOUCH VERIO VI STRP
ORAL_STRIP | 12 refills | Status: DC
Start: 2020-05-04 — End: 2020-05-04

## 2020-05-04 MED ORDER — BASAGLAR KWIKPEN 100 UNIT/ML ~~LOC~~ SOPN
20.0000 [IU] | PEN_INJECTOR | Freq: Every day | SUBCUTANEOUS | 3 refills | Status: DC
Start: 2020-05-04 — End: 2020-05-31

## 2020-05-04 MED ORDER — ONETOUCH VERIO VI STRP
ORAL_STRIP | 12 refills | Status: DC
Start: 2020-05-04 — End: 2020-11-06

## 2020-05-04 NOTE — Telephone Encounter (Signed)
All questions answered

## 2020-05-04 NOTE — Patient Instructions (Signed)
° ° ° °  If you have lab work done today you will be contacted with your lab results within the next 2 weeks.  If you have not heard from us then please contact us. The fastest way to get your results is to register for My Chart. ° ° °IF you received an x-ray today, you will receive an invoice from [facility]. Please contact [facility] at [phone] with questions or concerns regarding your invoice.  ° °IF you received labwork today, you will receive an invoice from [facility]. Please contact [facility] at [phone] with questions or concerns regarding your invoice.  ° °Our billing staff will not be able to assist you with questions regarding bills from these companies. ° °You will be contacted with the lab results as soon as they are available. The fastest way to get your results is to activate your My Chart account. Instructions are located on the last page of this paperwork. If you have not heard from us regarding the results in 2 weeks, please contact this office. °  ° ° ° °

## 2020-05-04 NOTE — Progress Notes (Signed)
8/27/202111:13 AM  Beverly Ramirez 01-23-1961, 59 y.o., female [id number]  Chief Complaint  Patient presents with  . Follow-up    follow up from gastro    HPI:   Patient is a 59 y.o. female with past medical history significant for ulcerative colitis, HLP, HTN, DM2, fibromyalgiawho presents today for routine followup  Video visit 2 weeks ago - stopped lantus as having recurrent hypoglycemia Started flexeril for HA Advised reach out to GI for aphthous ulcers/UC Chart reviewed: Saw GI aug 20th -  Stool tests showed ongoing inflammation from med non compliance, mouth ulcers should calm down with restarting med, rx for lomotil, if not then wondering about ENT referral, behcets disease, has appt in Oct  She had stopped lantus as discussed as she was not eating very much due to mouth ulcers However had to start novolog 30units again as cbgs in the 200s This morning fasting 170s.  Used to be metformin but was causing GI sx She denies any hypoglycemia     Lab Results  Component Value Date   HGBA1C 7.1 (H) 04/20/2020   HGBA1C 8.0 (H) 03/02/2020   HGBA1C 9.3 (H) 02/02/2020   Lab Results  Component Value Date   LDLCALC 179 (H) 02/02/2020   CREATININE 0.90 04/20/2020    Depression screen PHQ 2/9 04/13/2020 03/02/2020 03/01/2020  Decreased Interest 0 0 0  Down, Depressed, Hopeless 0 0 0  PHQ - 2 Score 0 0 0  Some recent data might be hidden    Fall Risk  04/13/2020 03/13/2020 03/02/2020 03/01/2020 02/02/2020  Falls in the past year? 0 0 0 0 0  Number falls in past yr: - 0 0 0 0  Injury with Fall? - 0 0 0 0  Follow up - - Falls evaluation completed - Falls evaluation completed     Allergies  Allergen Reactions  . Codeine Itching, Rash and Hives  . Latex Hives    With the power    Prior to Admission medications   Medication Sig Start Date End Date Taking? Authorizing Provider  albuterol (VENTOLIN HFA) 108 (90 Base) MCG/ACT inhaler Inhale 2 puffs into the lungs every 6  (six) hours as needed for wheezing or shortness of breath. 03/13/20  Yes Rutherford Guys, MD  amLODipine (NORVASC) 5 MG tablet Take 5 mg by mouth at bedtime.  12/15/19  Yes [provider]  atorvastatin (LIPITOR) 40 MG tablet Take 1 tablet (40 mg total) by mouth daily. 02/10/20  Yes Rutherford Guys, MD  cyclobenzaprine (FLEXERIL) 10 MG tablet Take 1 tablet (10 mg total) by mouth 3 (three) times daily as needed for muscle spasms. 09/26/19  Yes Rutherford Guys, MD  diphenoxylate-atropine (LOMOTIL) 2.5-0.025 MG tablet Take 1 tablet by mouth 4 (four) times daily as needed for diarrhea or loose stools. 04/27/20  Yes Gatha Mayer, MD  DULoxetine (CYMBALTA) 60 MG capsule TAKE 1 CAPSULE BY MOUTH EVERY DAY 03/20/20  Yes Rutherford Guys, MD  fluticasone [facility]) 50 MCG/ACT nasal spray SPRAY 2 SPRAYS INTO EACH NOSTRIL EVERY DAY 03/08/20  Yes Grant Fontana M, MD  glucose blood [facility] VERIO) test strip Use as instructed 12/16/19  Yes Rutherford Guys, MD  insulin aspart (NOVOLOG FLEXPEN) 100 UNIT/ML FlexPen Inject 15 Units into the skin 3 (three) times daily with meals. 12/26/19  Yes Rutherford Guys, MD  Insulin Glargine [facility]) 100 UNIT/ML Inject 0.4 mLs (40 Units total) into the skin at bedtime. 12/26/19  Yes Rutherford Guys, MD  Insulin Syringe-Needle U-100 (INSULIN SYRINGE 1CC/31GX5/16") 31G X 5/16" 1 ML MISC Inject 0.08 ( 8 Units)  into the skin 3 times daily before meals. 10/16/18  Yes Rutherford Guys, MD  Lancets (ONETOUCH DELICA PLUS [id number]) [facility] 1 each by Does not apply route 3 (three) times daily. Dx E11.65, z79.4 01/24/19  Yes Rutherford Guys, MD  lidocaine (XYLOCAINE) 2 % solution Use as directed 15 mLs in the mouth or throat as needed for mouth pain. 03/09/20  Yes Gatha Mayer, MD  mesalamine (LIALDA) 1.2 g EC tablet Take 2 tablets (2.4 g total) by mouth 2 (two) times daily. 04/28/20 04/23/21 Yes Gatha Mayer, MD  montelukast (SINGULAIR) 10 MG tablet Take 1 tablet (10 mg total)  by mouth at bedtime. 03/02/20  Yes Maximiano Coss, NP  Multiple Vitamins-Minerals (WOMENS 50+ MULTI VITAMIN/MIN PO) Take by mouth.   Yes [provider]  NONFORMULARY OR COMPOUNDED ITEM Swish and spit 5 mLs as needed (ulcer flare up in mouth). Magic mouthwash with lidocaine disp#150 ml 05/04/20  Yes Gatha Mayer, MD  ondansetron (ZOFRAN-ODT) 8 MG disintegrating tablet Take 1 tablet (8 mg total) by mouth every 8 (eight) hours as needed for nausea or vomiting. 04/19/20  Yes Rutherford Guys, MD  triamcinolone cream (KENALOG) 0.1 % Apply 1 application topically 2 (two) times daily. 04/19/20  Yes Rutherford Guys, MD    Past Medical History:  Diagnosis Date  . Anemia   . Carpal tunnel syndrome    bilateral  . Chronic heel pain   . Diabetes mellitus without complication ([place])   . DJD (degenerative joint disease) of cervical spine    MRI 2017  . Hyperlipidemia   . Plantar fasciitis    bilateral  . Sickle cell anemia (HCC)    sickle cell trait  . Sleep apnea   . Tendonitis    left hand/wrist  . UC (ulcerative colitis) ([place])     Past Surgical History:  Procedure Laterality Date  . ABDOMINAL HYSTERECTOMY    . APPENDECTOMY    . CESAREAN SECTION     x4  . COLONOSCOPY     Multiple in [place]  . ESOPHAGOGASTRODUODENOSCOPY      Social History   Tobacco Use  . Smoking status: Former Smoker    Types: Cigarettes    Quit date: 10/13/2006    Years since quitting: 13.5  . Smokeless tobacco: Never Used  Substance Use Topics  . Alcohol use: Yes    Comment: occasional    Family History  Problem Relation Age of Onset  . Stroke Mother   . Diabetes Mother   . Hypertension Mother   . Lung cancer Mother   . Sickle cell trait Mother   . Diabetes Maternal Aunt   . Lung cancer Maternal Aunt   . Lung cancer Maternal Aunt   . Lung cancer Maternal Aunt   . Colon cancer Cousin 55  . Sickle cell trait Father   . Sickle cell anemia Sister 58  . Lung cancer Maternal Grandmother     . Sickle cell anemia Sister   . Other Sister        accident and blood clot formed    Review of Systems  Constitutional: Negative for chills and fever.  Respiratory: Negative for cough and shortness of breath.   Cardiovascular: Negative for chest pain, palpitations and leg swelling.  Gastrointestinal: Negative for abdominal pain, nausea and vomiting.     OBJECTIVE:  Today's Vitals  05/04/20 1112  BP: 140/90  Pulse: 92  Temp: 97.6 F (36.4 C)  SpO2: 98%  Weight: 153 lb 6.4 oz (69.6 kg)  Height: 5' 2"  (1.575 m)   Body mass index is 28.06 kg/m.   Physical Exam Vitals and nursing note reviewed.  Constitutional:      Appearance: She is well-developed.  HENT:     Head: Normocephalic and atraumatic.     Mouth/Throat:     Pharynx: No oropharyngeal exudate.  Eyes:     General: No scleral icterus.    Conjunctiva/sclera: Conjunctivae normal.     Pupils: Pupils are equal, round, and reactive to light.  Cardiovascular:     Rate and Rhythm: Normal rate and regular rhythm.     Heart sounds: Normal heart sounds. No murmur heard.  No friction rub. No gallop.   Pulmonary:     Effort: Pulmonary effort is normal.     Breath sounds: Normal breath sounds. No wheezing or rales.  Musculoskeletal:     Cervical back: Neck supple.  Skin:    General: Skin is warm and dry.  Neurological:     Mental Status: She is alert and oriented to person, place, and time.     No results found for this or any previous visit (from the past 24 hour(s)).  No results found.   ASSESSMENT and PLAN  1. Type 2 diabetes mellitus with hyperglycemia, with long-term current use of insulin ([place]) 2. Hypoglycemia associated with type 2 diabetes mellitus ([place]) Hypogylcemia resolved now that oral ulcers are better and able to eat more. Resumed novolog. Fasting cbgs above goal, restart basaglar at 10 units and titrate slowly to fasting goal 90-130  Other orders - insulin aspart (NOVOLOG FLEXPEN) 100  UNIT/ML FlexPen; Inject 20 Units into the skin 3 (three) times daily with meals. - Insulin Glargine (BASAGLAR KWIKPEN) 100 UNIT/ML; Inject 0.2 mLs (20 Units total) into the skin at bedtime. - glucose blood (ONETOUCH VERIO) test strip; Check blood glucose 3-4 x day. Dx E11.9, E11.649, z79.4, - Insulin Pen Needle (PEN NEEDLES) 32G X 6 MM MISC; Use to inject insulin four times a day. Dx E11.9, z79.4 - Flu Vaccine QUAD 36+ mos IM  Return in about 4 weeks (around 06/01/2020).    Rutherford Guys, MD Primary [facility] at [place] [place], [place] [address] Ph.  [phone] Fax [phone]

## 2020-05-07 ENCOUNTER — Other Ambulatory Visit: Payer: Self-pay

## 2020-05-07 ENCOUNTER — Encounter (HOSPITAL_COMMUNITY): Payer: Self-pay

## 2020-05-07 DIAGNOSIS — K5190 Ulcerative colitis, unspecified, without complications: Secondary | ICD-10-CM | POA: Diagnosis not present

## 2020-05-07 DIAGNOSIS — Z87891 Personal history of nicotine dependence: Secondary | ICD-10-CM | POA: Diagnosis not present

## 2020-05-07 DIAGNOSIS — E1110 Type 2 diabetes mellitus with ketoacidosis without coma: Secondary | ICD-10-CM | POA: Diagnosis not present

## 2020-05-07 DIAGNOSIS — R197 Diarrhea, unspecified: Secondary | ICD-10-CM | POA: Insufficient documentation

## 2020-05-07 DIAGNOSIS — R112 Nausea with vomiting, unspecified: Secondary | ICD-10-CM | POA: Diagnosis not present

## 2020-05-07 DIAGNOSIS — K6389 Other specified diseases of intestine: Secondary | ICD-10-CM | POA: Diagnosis not present

## 2020-05-07 DIAGNOSIS — R1013 Epigastric pain: Secondary | ICD-10-CM | POA: Diagnosis not present

## 2020-05-07 DIAGNOSIS — Z91040 Latex allergy status: Secondary | ICD-10-CM | POA: Diagnosis not present

## 2020-05-07 DIAGNOSIS — N281 Cyst of kidney, acquired: Secondary | ICD-10-CM | POA: Diagnosis not present

## 2020-05-07 DIAGNOSIS — Z90710 Acquired absence of both cervix and uterus: Secondary | ICD-10-CM | POA: Diagnosis not present

## 2020-05-07 LAB — COMPREHENSIVE METABOLIC PANEL
ALT: 49 U/L — ABNORMAL HIGH (ref 0–44)
AST: 38 U/L (ref 15–41)
Albumin: 3.7 g/dL (ref 3.5–5.0)
Alkaline Phosphatase: 120 U/L (ref 38–126)
Anion gap: 9 (ref 5–15)
BUN: 12 mg/dL (ref 6–20)
CO2: 25 mmol/L (ref 22–32)
Calcium: 8.9 mg/dL (ref 8.9–10.3)
Chloride: 104 mmol/L (ref 98–111)
Creatinine, Ser: 0.86 mg/dL (ref 0.44–1.00)
GFR calc Af Amer: >60 mL/min (ref >60)
GFR calc non Af Amer: >60 mL/min (ref >60)
Glucose, Bld: 110 mg/dL — ABNORMAL HIGH (ref 70–99)
Potassium: 3.3 mmol/L — ABNORMAL LOW (ref 3.5–5.1)
Sodium: 138 mmol/L (ref 135–145)
Total Bilirubin: 0.4 mg/dL (ref 0.3–1.2)
Total Protein: 7.4 g/dL (ref 6.5–8.1)

## 2020-05-07 LAB — CBC
HCT: 37.9 % (ref 36.0–46.0)
Hemoglobin: 12.6 g/dL (ref 12.0–15.0)
MCH: 28.8 pg (ref 26.0–34.0)
MCHC: 33.2 g/dL (ref 30.0–36.0)
MCV: 86.7 fL (ref 80.0–100.0)
Platelets: 354 10*3/uL (ref 150–400)
RBC: 4.37 MIL/uL (ref 3.87–5.11)
RDW: 12.4 % (ref 11.5–15.5)
WBC: 10.5 10*3/uL (ref 4.0–10.5)
nRBC: 0 % (ref 0.0–0.2)

## 2020-05-07 LAB — LIPASE, BLOOD: Lipase: 17 U/L (ref 11–51)

## 2020-05-07 LAB — CBG MONITORING, ED: Glucose-Capillary: 103 mg/dL — ABNORMAL HIGH (ref 70–99)

## 2020-05-07 NOTE — ED Triage Notes (Signed)
Pt coming in c/o diarrhea x2 weeks. Hx colitis. Now has blood in stool. Decreased PO intake due to N/V

## 2020-05-08 ENCOUNTER — Emergency Department (HOSPITAL_COMMUNITY): Payer: Medicare Other

## 2020-05-08 ENCOUNTER — Emergency Department (HOSPITAL_COMMUNITY)
Admission: EM | Admit: 2020-05-08 | Discharge: 2020-05-08 | Disposition: A | Discharge status: Home / Self Care | Payer: Medicare Other | Attending: Emergency Medicine | Admitting: Emergency Medicine

## 2020-05-08 ENCOUNTER — Encounter (HOSPITAL_COMMUNITY): Payer: Self-pay

## 2020-05-08 DIAGNOSIS — Z90710 Acquired absence of both cervix and uterus: Secondary | ICD-10-CM | POA: Diagnosis not present

## 2020-05-08 DIAGNOSIS — R197 Diarrhea, unspecified: Secondary | ICD-10-CM

## 2020-05-08 DIAGNOSIS — R1013 Epigastric pain: Secondary | ICD-10-CM | POA: Diagnosis not present

## 2020-05-08 DIAGNOSIS — K6389 Other specified diseases of intestine: Secondary | ICD-10-CM | POA: Diagnosis not present

## 2020-05-08 DIAGNOSIS — K5190 Ulcerative colitis, unspecified, without complications: Secondary | ICD-10-CM | POA: Diagnosis not present

## 2020-05-08 DIAGNOSIS — N281 Cyst of kidney, acquired: Secondary | ICD-10-CM | POA: Diagnosis not present

## 2020-05-08 LAB — CBG MONITORING, ED: Glucose-Capillary: 144 mg/dL — ABNORMAL HIGH (ref 70–99)

## 2020-05-08 LAB — URINALYSIS, ROUTINE W REFLEX MICROSCOPIC
Bilirubin Urine: NEGATIVE
Glucose, UA: NEGATIVE mg/dL
Hgb urine dipstick: NEGATIVE
Ketones, ur: NEGATIVE mg/dL
Leukocytes,Ua: NEGATIVE
Nitrite: NEGATIVE
Protein, ur: NEGATIVE mg/dL
Specific Gravity, Urine: 1.014 (ref 1.005–1.030)
pH: 5 (ref 5.0–8.0)

## 2020-05-08 IMAGING — CT CT ABD-PELV W/ CM
2 of 5 series · 15 of 46 positions shown, 17 images · IV contrast (omnipaque)
Comparison: [DATE] CT abdomen/pelvis.

CLINICAL DATA: Ulcerative colitis. Prior appendectomy and
hysterectomy. Diarrhea for 2 weeks with nausea and vomiting and
bloody stools.

EXAM:
CT ABDOMEN AND PELVIS WITH CONTRAST
TECHNIQUE: Multidetector CT imaging of the abdomen and pelvis was performed
using the standard protocol following bolus administration of
intravenous contrast.
CONTRAST:  100mL OMNIPAQUE IOHEXOL 300 MG/ML  SOLN

[Series 3: axial st · axial · 0.89mm/px · z∈[+1044,+1459]mm · 12 of 95 slices shown, 14 images]
[im 6/95  soft-tissue]
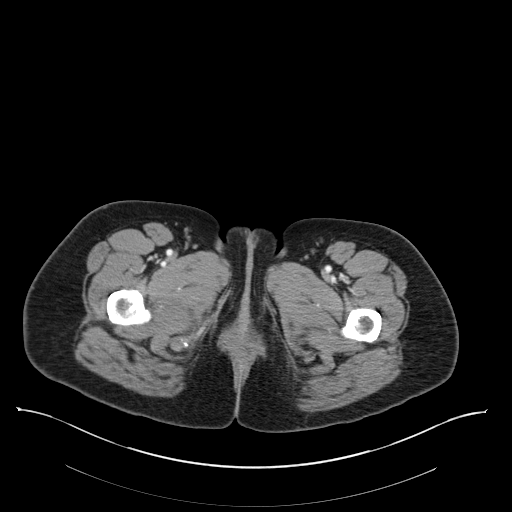
[im 6/95  bone]
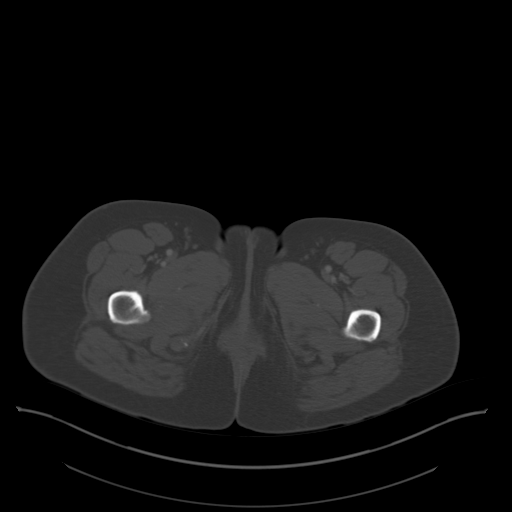
[im 12/95  soft-tissue]
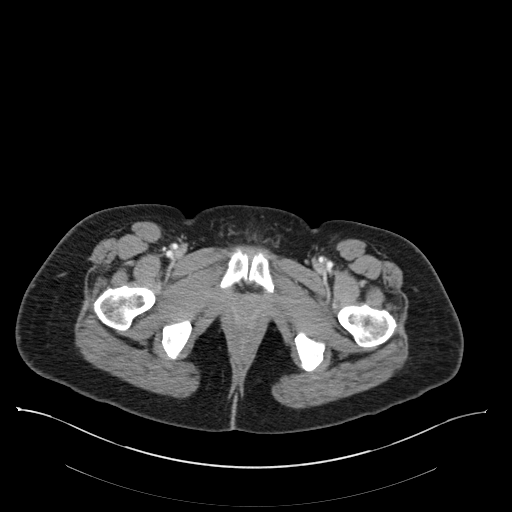
[im 24/95  soft-tissue]
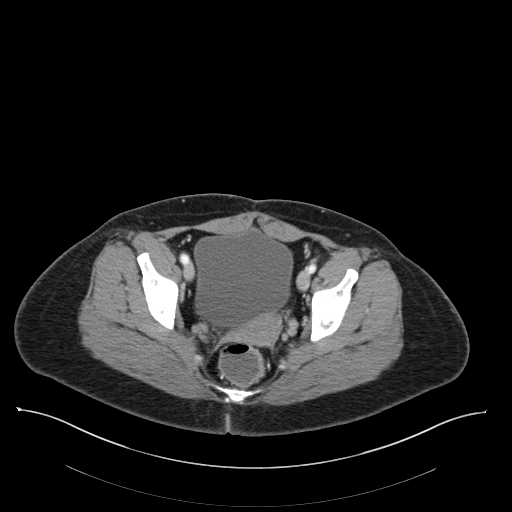
[im 30/95  soft-tissue]
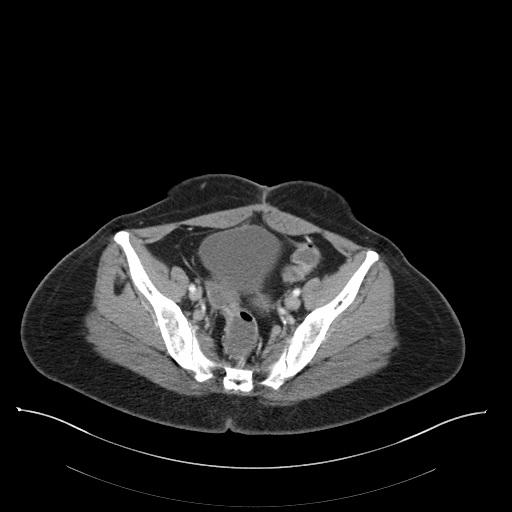
[im 36/95  soft-tissue]
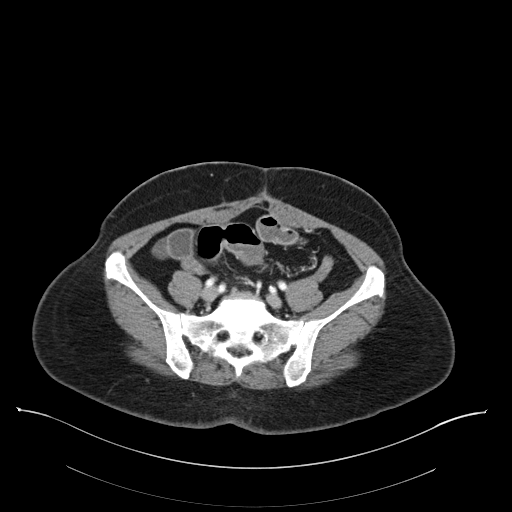
[im 42/95  soft-tissue]
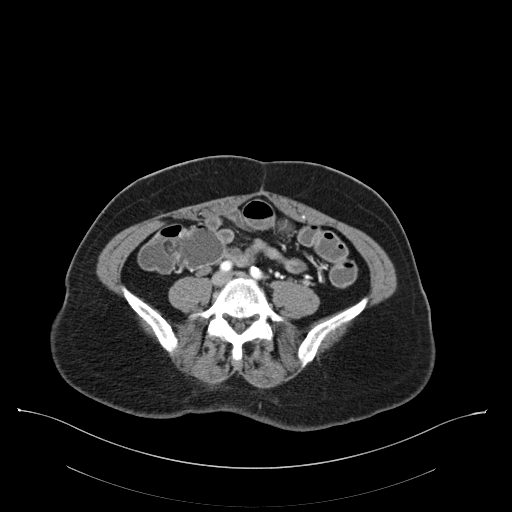
[im 53/95  soft-tissue]
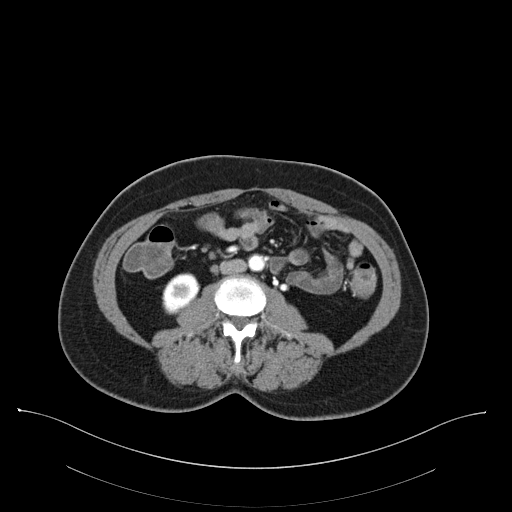
[im 59/95  soft-tissue]
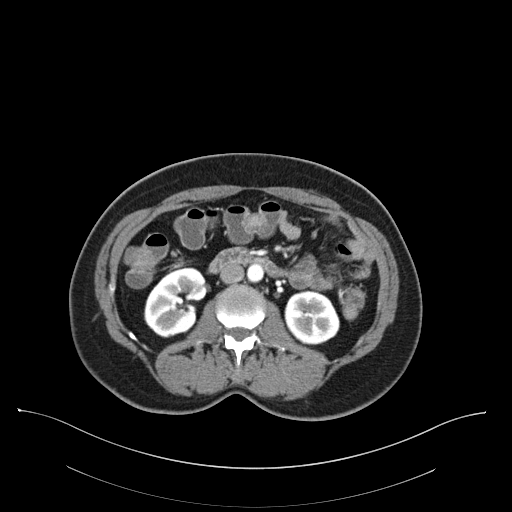
[im 65/95  soft-tissue]
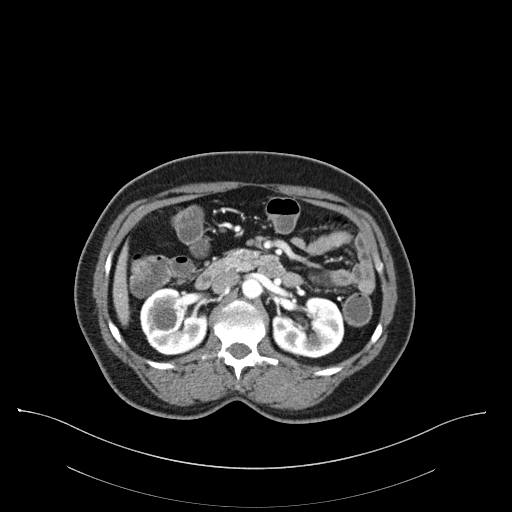
[im 65/95  bone]
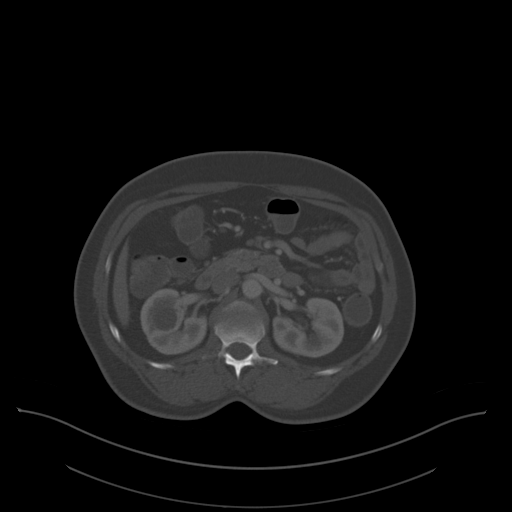
[im 71/95  soft-tissue]
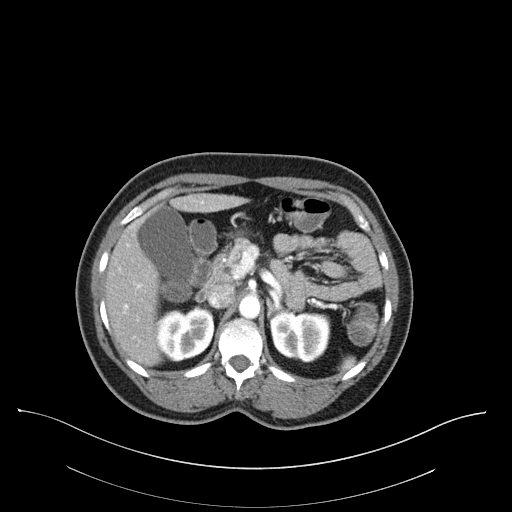
[im 83/95  soft-tissue]
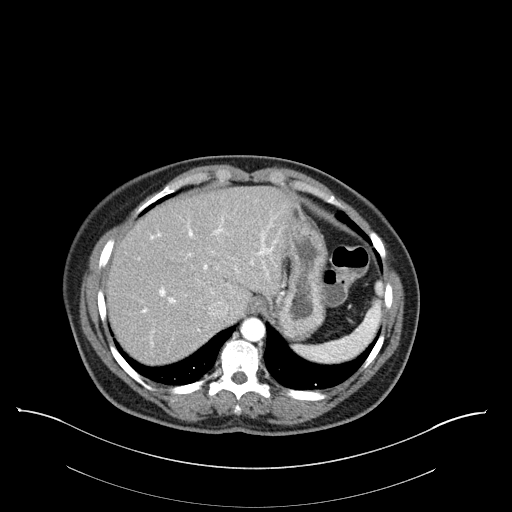
[im 89/95  soft-tissue]
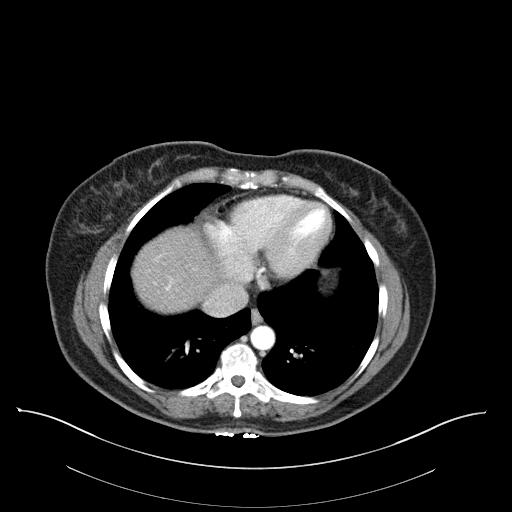

[Series 5: coronal st · coronal · 0.66mm/px · 3 of 135 slices shown]
[im 45/135  soft-tissue]
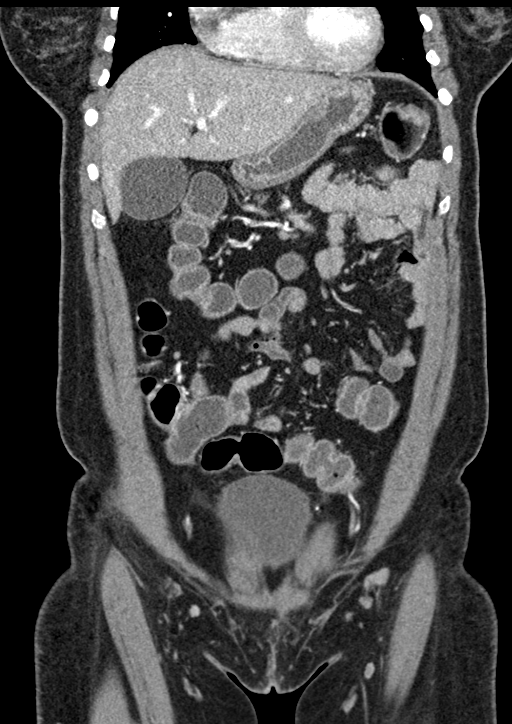
[im 60/135  soft-tissue]
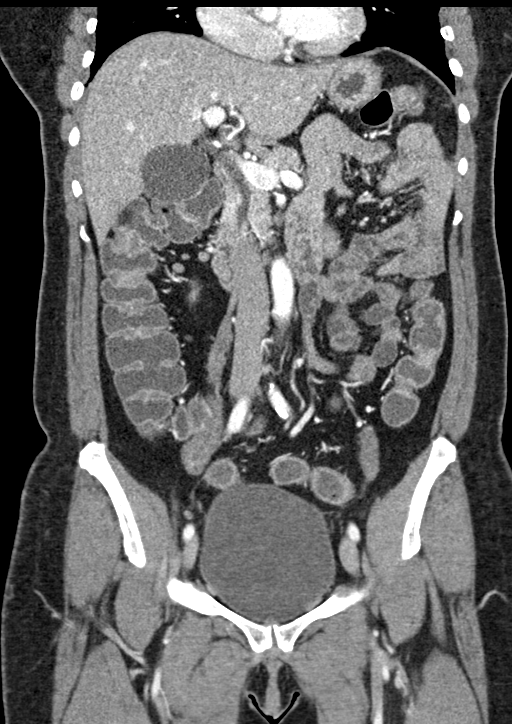
[im 75/135  soft-tissue]
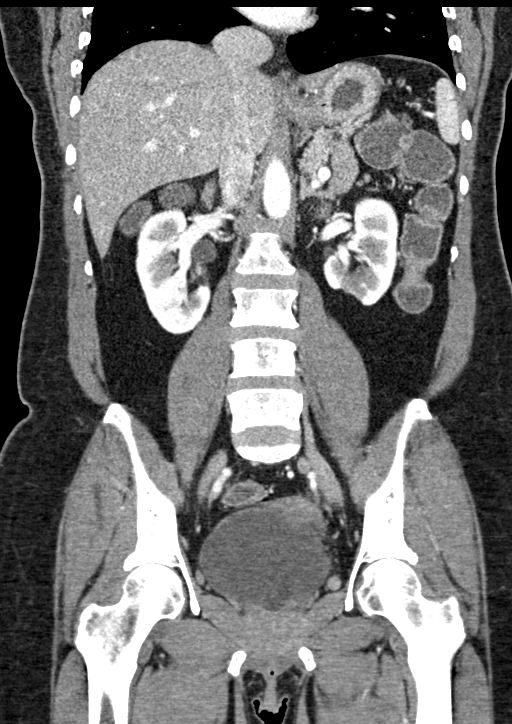

[15 of 46 positions shown; findings below may reference images not displayed]

FINDINGS: Lower chest: No significant pulmonary nodules or acute consolidative
airspace disease.

Hepatobiliary: Normal liver size. No liver mass. Normal gallbladder
with no radiopaque cholelithiasis. No biliary ductal dilatation.

Pancreas: Normal, with no mass or duct dilation.

Spleen: Normal size. No mass.

Adrenals/Urinary Tract: Normal adrenals. No hydronephrosis. Simple
1.8 cm interpolar right renal cyst. Additional scattered
subcentimeter hypodense renal cortical lesions in both kidneys are
too small to characterize and not appreciably changed, considered
benign. Scattered mild renal cortical scarring in both kidneys,
stable. Normal bladder.

Stomach/Bowel: Normal non-distended stomach. Normal caliber small
bowel with no small bowel wall thickening. Appendectomy. Fluid
levels throughout the large bowel. No definite large bowel wall
thickening or significant pericolonic fat stranding. No colonic
diverticulosis.

Vascular/Lymphatic: Normal caliber abdominal aorta. Patent portal,
splenic, hepatic and renal veins. No pathologically enlarged lymph
nodes in the abdomen or pelvis.

Reproductive: Stable postsurgical changes from reported hysterectomy
with stable remnant soft tissue at the hysterectomy margin
compatible with cervical remnant. No adnexal mass.

Other: No pneumoperitoneum, ascites or focal fluid collection.

Musculoskeletal: No aggressive appearing focal osseous lesions.
IMPRESSION: Fluid levels throughout the large bowel, indicative of a nonspecific
diarrheal state. No evidence of bowel obstruction or bowel wall
thickening.

## 2020-05-08 MED ORDER — DICYCLOMINE HCL 20 MG PO TABS: 20.0000 mg | ORAL_TABLET | Freq: Three times a day (TID) | ORAL | 0 refills | Status: DC | PRN

## 2020-05-08 MED ORDER — IOHEXOL 300 MG/ML  SOLN
100.0000 mL | Freq: Once | INTRAMUSCULAR | Status: AC | PRN
  Administered 2020-05-08: 100 mL via INTRAVENOUS

## 2020-05-08 MED ORDER — ONDANSETRON HCL 4 MG/2ML IJ SOLN
4.0000 mg | Freq: Once | INTRAMUSCULAR | Status: AC
  Administered 2020-05-08: 4 mg via INTRAVENOUS
  Filled 2020-05-08: qty 2

## 2020-05-08 MED ORDER — SODIUM CHLORIDE 0.9 % IV BOLUS
500.0000 mL | Freq: Once | INTRAVENOUS | Status: AC
  Administered 2020-05-08: 500 mL via INTRAVENOUS

## 2020-05-08 MED ORDER — DICYCLOMINE HCL 10 MG PO CAPS
10.0000 mg | ORAL_CAPSULE | Freq: Once | ORAL | Status: AC
  Administered 2020-05-08: 10 mg via ORAL
  Filled 2020-05-08: qty 1

## 2020-05-08 MED ORDER — SODIUM CHLORIDE (PF) 0.9 % IJ SOLN
INTRAMUSCULAR | Status: AC
  Filled 2020-05-08: qty 50

## 2020-05-08 MED ORDER — IOHEXOL 300 MG/ML  SOLN: 100.0000 mL | Freq: Once | INTRAMUSCULAR | Status: DC | PRN

## 2020-05-08 MED ORDER — ONDANSETRON 4 MG PO TBDP: 4.0000 mg | ORAL_TABLET | Freq: Three times a day (TID) | ORAL | 0 refills | Status: DC | PRN

## 2020-05-08 MED ORDER — MORPHINE SULFATE (PF) 4 MG/ML IV SOLN
4.0000 mg | Freq: Once | INTRAVENOUS | Status: AC
  Administered 2020-05-08: 4 mg via INTRAVENOUS
  Filled 2020-05-08: qty 1

## 2020-05-08 NOTE — Discharge Instructions (Signed)
You were seen in the emergency department today with abdominal pain and diarrhea.  Please contact your gastroenterologist to discuss your medications for ulcerative colitis and coordinate a stool study as an outpatient if they feel this would be warranted.  I have called in medication for your Bentyl and Zofran to help with abdominal cramping and nausea symptoms.  Return to the emergency department with any worsening pain or fever.

## 2020-05-08 NOTE — ED Provider Notes (Signed)
Emergency Department Provider Note   I have reviewed the triage vital signs and the nursing notes.   HISTORY  Chief Complaint Diarrhea   HPI Beverly Ramirez is a 59 y.o. female with past medical history reviewed below presents to the emergency department with abdominal pain in the epigastric region with profuse watery and now bloody diarrhea.  She has had similar episodes with ulcerative colitis flares in the past.  She denies any fevers or shaking chills.  No dysuria, hesitancy, urgency.  She is having some dull, aching pain in her back.  Her abdominal pain is mainly in the epigastric region and nonradiating.  She is having some associated nausea and vomiting with decreased oral intake.  The blood in her stool is streaks and bright red blood.  She is having too many episodes of diarrhea daily to count.  Symptoms initially began 2 weeks ago.    Past Medical History:  Diagnosis Date  . Anemia   . Carpal tunnel syndrome    bilateral  . Chronic heel pain   . Diabetes mellitus without complication ([place])   . DJD (degenerative joint disease) of cervical spine    MRI 2017  . Hyperlipidemia   . Plantar fasciitis    bilateral  . Sickle cell anemia (HCC)    sickle cell trait  . Sleep apnea   . Tendonitis    left hand/wrist  . UC (ulcerative colitis) [facility])     Patient Active Problem List   Diagnosis Date Noted  . Elevated glucose 12/23/2019  . Diabetic ketoacidosis without coma associated with type 2 diabetes mellitus ([place]) 12/23/2019  . De Quervain's tenosynovitis, left 11/16/2018  . Bilateral carpal tunnel syndrome 04/13/2018  . Leukocytosis 10/04/2017  . Hyperlipidemia 08/04/2017  . Ulcerative colitis without complications ([place]) 46/80/3212  . Plantar fasciitis 05/28/2017    Past Surgical History:  Procedure Laterality Date  . ABDOMINAL HYSTERECTOMY    . APPENDECTOMY    . CESAREAN SECTION     x4  . COLONOSCOPY     Multiple in [place]  .  ESOPHAGOGASTRODUODENOSCOPY      Allergies Codeine and Latex  Family History  Problem Relation Age of Onset  . Stroke Mother   . Diabetes Mother   . Hypertension Mother   . Lung cancer Mother   . Sickle cell trait Mother   . Diabetes Maternal Aunt   . Lung cancer Maternal Aunt   . Lung cancer Maternal Aunt   . Lung cancer Maternal Aunt   . Colon cancer Cousin 31  . Sickle cell trait Father   . Sickle cell anemia Sister 74  . Lung cancer Maternal Grandmother   . Sickle cell anemia Sister   . Other Sister        accident and blood clot formed    Social History Social History   Tobacco Use  . Smoking status: Former Smoker    Types: Cigarettes    Quit date: 10/13/2006    Years since quitting: 13.5  . Smokeless tobacco: Never Used  Vaping Use  . Vaping Use: Never used  Substance Use Topics  . Alcohol use: Yes    Comment: occasional  . Drug use: No    Review of Systems  Constitutional: No fever/chills Eyes: No visual changes. ENT: No sore throat. Cardiovascular: Denies chest pain. Respiratory: Denies shortness of breath. Gastrointestinal: Positive epigastric abdominal pain. Positive nausea, vomiting, and diarrhea.  No constipation. Genitourinary: Negative for dysuria. Musculoskeletal: Negative for back pain. Skin:  Negative for rash. Neurological: Negative for headaches, focal weakness or numbness.  10-point ROS otherwise negative.  ____________________________________________   PHYSICAL EXAM:  VITAL SIGNS: ED Triage Vitals  Enc Vitals Group     BP 05/07/20 2252 (!) 140/95     Pulse Rate 05/07/20 2252 72     Resp 05/07/20 2252 18     Temp 05/07/20 2252 97.9 F (36.6 C)     Temp Source 05/07/20 2252 Oral     SpO2 05/07/20 2252 100 %     Weight 05/07/20 2253 153 lb (69.4 kg)     Height 05/07/20 2253 5' 2"  (1.575 m)   Constitutional: Alert and oriented. Well appearing and in no acute distress. Eyes: Conjunctivae are normal. Head: Atraumatic. Nose: No  congestion/rhinnorhea. Mouth/Throat: Mucous membranes are moist.  Neck: No stridor.   Cardiovascular: Normal rate, regular rhythm. Good peripheral circulation. Grossly normal heart sounds.   Respiratory: Normal respiratory effort.  No retractions. Lungs CTAB. Gastrointestinal: Soft with mild diffuse tenderness in the epigastric region. No rebound or guarding. No distention.  Musculoskeletal: No gross deformities of extremities. Neurologic:  Normal speech and language Skin:  Skin is warm, dry and intact. No rash noted.   ____________________________________________   LABS (all labs ordered are listed, but only abnormal results are displayed)  Labs Reviewed  COMPREHENSIVE METABOLIC PANEL - Abnormal; Notable for the following components:      Result Value   Potassium 3.3 (*)    Glucose, Bld 110 (*)    ALT 49 (*)    All other components within normal limits  CBG MONITORING, ED - Abnormal; Notable for the following components:   Glucose-Capillary 103 (*)    All other components within normal limits  CBG MONITORING, ED - Abnormal; Notable for the following components:   Glucose-Capillary 144 (*)    All other components within normal limits  C DIFFICILE QUICK SCREEN W PCR REFLEX  GASTROINTESTINAL PANEL BY PCR, STOOL (REPLACES STOOL CULTURE)  LIPASE, BLOOD  CBC  URINALYSIS, ROUTINE W REFLEX MICROSCOPIC   ____________________________________________  RADIOLOGY  CT ABDOMEN PELVIS W CONTRAST  Result Date: 05/08/2020 CLINICAL DATA:  Ulcerative colitis. Prior appendectomy and hysterectomy. Diarrhea for 2 weeks with nausea and vomiting and bloody stools. EXAM: CT ABDOMEN AND PELVIS WITH CONTRAST TECHNIQUE: Multidetector CT imaging of the abdomen and pelvis was performed using the standard protocol following bolus administration of intravenous contrast. CONTRAST:  198m OMNIPAQUE IOHEXOL 300 MG/ML  SOLN COMPARISON:  01/12/2019 CT abdomen/pelvis. FINDINGS: Lower chest: No significant  pulmonary nodules or acute consolidative airspace disease. Hepatobiliary: Normal liver size. No liver mass. Normal gallbladder with no radiopaque cholelithiasis. No biliary ductal dilatation. Pancreas: Normal, with no mass or duct dilation. Spleen: Normal size. No mass. Adrenals/Urinary Tract: Normal adrenals. No hydronephrosis. Simple 1.8 cm interpolar right renal cyst. Additional scattered subcentimeter hypodense renal cortical lesions in both kidneys are too small to characterize and not appreciably changed, considered benign. Scattered mild renal cortical scarring in both kidneys, stable. Normal bladder. Stomach/Bowel: Normal non-distended stomach. Normal caliber small bowel with no small bowel wall thickening. Appendectomy. Fluid levels throughout the large bowel. No definite large bowel wall thickening or significant pericolonic fat stranding. No colonic diverticulosis. Vascular/Lymphatic: Normal caliber abdominal aorta. Patent portal, splenic, hepatic and renal veins. No pathologically enlarged lymph nodes in the abdomen or pelvis. Reproductive: Stable postsurgical changes from reported hysterectomy with stable remnant soft tissue at the hysterectomy margin compatible with cervical remnant. No adnexal mass. Other: No pneumoperitoneum, ascites or focal  fluid collection. Musculoskeletal: No aggressive appearing focal osseous lesions. IMPRESSION: Fluid levels throughout the large bowel, indicative of a nonspecific diarrheal state. No evidence of bowel obstruction or bowel wall thickening. Electronically Signed   By: Ilona Sorrel M.D.   On: 05/08/2020 10:22    ____________________________________________   PROCEDURES  Procedure(s) performed:   Procedures  None  ____________________________________________   INITIAL IMPRESSION / ASSESSMENT AND PLAN / ED COURSE  Pertinent labs & imaging results that were available during my care of the patient were reviewed by me and considered in my medical  decision making (see chart for details).   Patient with past medical history of ulcerative colitis presents with abdominal pain, vomiting, diarrhea.  There is some blood in the vomit.  Suspect UC flare.  Patient is on mesalamine and recently started Lomotil.  She is followed by with our GI.  Vital signs are reassuring.  Plan for CT imaging with last imaging in May 2020.  We will give IV fluids and Zofran pending imaging results.   12:05 PM  Patient with no additional diarrhea in the ED to send for stool study.  Patient is feeling better after additional IV fluids, Bentyl.  Plan for discharge home with prescription for Bentyl along with refill of her Zofran prescription.  Patient to call her PCP and GI doctors to coordinate outpatient stool studies if symptoms continue.  She will also continue her Lomotil as prescribed as well as her mesalamine.  Discussed ED return precautions in detail. ____________________________________________  FINAL CLINICAL IMPRESSION(S) / ED DIAGNOSES  Final diagnoses:  Epigastric pain  Diarrhea, unspecified type     MEDICATIONS GIVEN DURING THIS VISIT:  Medications  iohexol (OMNIPAQUE) 300 MG/ML solution 100 mL (has no administration in time range)  sodium chloride 0.9 % bolus 500 mL (0 mLs Intravenous Stopped 05/08/20 1058)  ondansetron (ZOFRAN) injection 4 mg (4 mg Intravenous Given 05/08/20 0805)  sodium chloride (PF) 0.9 % injection (  Given by Other 05/08/20 1059)  iohexol (OMNIPAQUE) 300 MG/ML solution 100 mL (100 mLs Intravenous Contrast Given 05/08/20 0947)  morphine 4 MG/ML injection 4 mg (4 mg Intravenous Given 05/08/20 1039)  sodium chloride 0.9 % bolus 500 mL (500 mLs Intravenous New Bag/Given 05/08/20 1118)  dicyclomine (BENTYL) capsule 10 mg (10 mg Oral Given 05/08/20 1118)     NEW OUTPATIENT MEDICATIONS STARTED DURING THIS VISIT:  New Prescriptions   DICYCLOMINE (BENTYL) 20 MG TABLET    Take 1 tablet (20 mg total) by mouth 3 (three) times daily as  needed for spasms (abdominal cramping).   ONDANSETRON (ZOFRAN ODT) 4 MG DISINTEGRATING TABLET    Take 1 tablet (4 mg total) by mouth every 8 (eight) hours as needed for nausea or vomiting.    Note:  This document was prepared using Dragon voice recognition software and may include unintentional dictation errors.  Nanda Quinton, MD, [facility]    Roxanna Mcever, W[facility], MD 05/08/20 1210

## 2020-05-23 ENCOUNTER — Telehealth: Payer: Self-pay | Admitting: Internal Medicine

## 2020-05-23 ENCOUNTER — Encounter: Payer: Self-pay | Admitting: Family Medicine

## 2020-05-23 NOTE — Telephone Encounter (Signed)
Pt states that her sxs have gotten worse from the last time that she was here. She would like to be seen sooner that her current appt in October. Pls call her.

## 2020-05-23 NOTE — Telephone Encounter (Signed)
Patient reports that she more apthous  Ulcers and her colitis is flaring.  She will keep her appt with Alonza Bogus, PA on 05/25/20

## 2020-05-25 ENCOUNTER — Ambulatory Visit: Payer: Medicare Other | Admitting: Dietician

## 2020-05-25 ENCOUNTER — Ambulatory Visit (INDEPENDENT_AMBULATORY_CARE_PROVIDER_SITE_OTHER): Payer: Medicare Other | Admitting: Gastroenterology

## 2020-05-25 ENCOUNTER — Encounter: Payer: Self-pay | Admitting: Gastroenterology

## 2020-05-25 VITALS — BP 118/80 | HR 74 | Ht 62.0 in | Wt 153.0 lb

## 2020-05-25 DIAGNOSIS — K51919 Ulcerative colitis, unspecified with unspecified complications: Secondary | ICD-10-CM

## 2020-05-25 DIAGNOSIS — K121 Other forms of stomatitis: Secondary | ICD-10-CM

## 2020-05-25 MED ORDER — PREDNISONE 10 MG PO TABS: ORAL_TABLET | ORAL | 0 refills | Status: AC

## 2020-05-25 NOTE — Progress Notes (Addendum)
05/25/2020 Beverly Ramirez [id number] [birth year]   HISTORY OF PRESENT ILLNESS: This is a 59 year old female who is a patient of Dr. Celesta Aver.  She follows here for history of ulcerative colitis that was initially diagnosed in [place] in 2007.  Last colonoscopy by Dr. Carlean Purl in 2020 with colitis in both endoscopic and pathologic remission with some quiescent changes and mild chronic colitis on a few biopsies.  She had previously been on Biologics and came off of them and has been on Lialda for the most part.  She is on Lialda 4.8 g daily.  She takes this 2 in the morning and 2 in the afternoon, but admits that she often forgets the afternoon dose.  She was previously treated with 6-MP, Humira, and Entyvio in [place].  Dr. Carlean Purl reports that insurance and cost issues have been a barrier to restarting Biologics.  She continues to complain of generalized abdominal soreness and cramping.  She says that her stools are still loose.  She describes having mouth ulcers and Magic mouthwash with lidocaine does not seem to be helping much anymore.  Her fecal calprotectin in August was quite high at 743.  She had been off of her medication for some time prior to that, however.  She says that prednisone always seems to help her very quickly.  She does tell me that she has had issues with her blood sugars being very high when she's been on prednisone in the past.   Past Medical History:  Diagnosis Date  . Anemia   . Carpal tunnel syndrome    bilateral  . Chronic heel pain   . Diabetes mellitus without complication ([place])   . DJD (degenerative joint disease) of cervical spine    MRI 2017  . Hyperlipidemia   . Plantar fasciitis    bilateral  . Sickle cell anemia (HCC)    sickle cell trait  . Sleep apnea   . Tendonitis    left hand/wrist  . UC (ulcerative colitis) ([place])    Past Surgical History:  Procedure Laterality Date  . ABDOMINAL HYSTERECTOMY    . APPENDECTOMY    . CESAREAN  SECTION     x4  . COLONOSCOPY     Multiple in [place]  . ESOPHAGOGASTRODUODENOSCOPY      reports that she quit smoking about 13 years ago. Her smoking use included cigarettes. She has never used smokeless tobacco. She reports current alcohol use. She reports that she does not use drugs. family history includes Colon cancer (age of onset: 109) in her cousin; Diabetes in her maternal aunt and mother; Hypertension in her mother; Lung cancer in her maternal aunt, maternal aunt, maternal aunt, maternal grandmother, and mother; Other in her sister; Sickle cell anemia in her sister; Sickle cell anemia (age of onset: 1) in her sister; Sickle cell trait in her father and mother; Stroke in her mother. Allergies  Allergen Reactions  . Codeine Itching, Rash and Hives  . Latex Hives    With the power      Outpatient Encounter Medications as of 05/25/2020  Medication Sig  . albuterol (VENTOLIN HFA) 108 (90 Base) MCG/ACT inhaler Inhale 2 puffs into the lungs every 6 (six) hours as needed for wheezing or shortness of breath.  Marland Kitchen amLODipine (NORVASC) 5 MG tablet Take 5 mg by mouth at bedtime.   Marland Kitchen atorvastatin (LIPITOR) 40 MG tablet Take 1 tablet (40 mg total) by mouth daily.  . cyclobenzaprine (FLEXERIL) 10 MG tablet Take  1 tablet (10 mg total) by mouth 3 (three) times daily as needed for muscle spasms.  Marland Kitchen dicyclomine (BENTYL) 20 MG tablet Take 1 tablet (20 mg total) by mouth 3 (three) times daily as needed for spasms (abdominal cramping).  . diphenoxylate-atropine (LOMOTIL) 2.5-0.025 MG tablet Take 1 tablet by mouth 4 (four) times daily as needed for diarrhea or loose stools.  . DULoxetine (CYMBALTA) 60 MG capsule TAKE 1 CAPSULE BY MOUTH EVERY DAY  . fluticasone (FLONASE) 50 MCG/ACT nasal spray SPRAY 2 SPRAYS INTO EACH NOSTRIL EVERY DAY  . glucose blood (ONETOUCH VERIO) test strip Check blood glucose 3-4 x day. Dx E11.9, E11.649, z79.4,  . insulin aspart (NOVOLOG FLEXPEN) 100 UNIT/ML FlexPen Inject 20  Units into the skin 3 (three) times daily with meals.  . Insulin Glargine (BASAGLAR KWIKPEN) 100 UNIT/ML Inject 0.2 mLs (20 Units total) into the skin at bedtime.  . Insulin Pen Needle (PEN NEEDLES) 32G X 6 MM MISC Use to inject insulin four times a day. Dx E11.9, z79.4  . Lancets (ONETOUCH DELICA PLUS LYYTKP54S) MISC 1 each by Does not apply route 3 (three) times daily. Dx E11.65, z79.4  . lidocaine (XYLOCAINE) 2 % solution Use as directed 15 mLs in the mouth or throat as needed for mouth pain.  . mesalamine (LIALDA) 1.2 g EC tablet Take 2 tablets (2.4 g total) by mouth 2 (two) times daily.  . montelukast (SINGULAIR) 10 MG tablet Take 1 tablet (10 mg total) by mouth at bedtime.  . Multiple Vitamins-Minerals (WOMENS 50+ MULTI VITAMIN/MIN PO) Take by mouth.  . NONFORMULARY OR COMPOUNDED ITEM Swish and spit 5 mLs as needed (ulcer flare up in mouth). Magic mouthwash with lidocaine disp#150 ml  . ondansetron (ZOFRAN ODT) 4 MG disintegrating tablet Take 1 tablet (4 mg total) by mouth every 8 (eight) hours as needed for nausea or vomiting.  . triamcinolone cream (KENALOG) 0.1 % Apply 1 application topically 2 (two) times daily.   No facility-administered encounter medications on file as of 05/25/2020.     REVIEW OF SYSTEMS  : All other systems reviewed and negative except where noted in the History of Present Illness.   PHYSICAL EXAM: Ht 5' 2"  (1.575 m)   Wt 153 lb (69.4 kg)   BMI 27.98 kg/m  General: Well developed AA female in no acute distress Head: Normocephalic and atraumatic Eyes:  Sclerae anicteric, conjunctiva pink. Ears: Normal auditory acuity Lungs: Clear throughout to auscultation; no W/R/R. Heart: Regular rate and rhythm; no M/R/G. Abdomen: Soft, non-distended.  BS present.  Mild diffuse TTP. Musculoskeletal: Symmetrical with no gross deformities  Skin: No lesions on visible extremities Extremities: No edema  Neurological: Alert oriented x 4, grossly non-focal Psychological:   Alert and cooperative. Normal mood and affect  ASSESSMENT AND PLAN: *59 year old female with UC and recurrent aphthous ulcers:  Says that the mouthwash is not helping much.  Does sometimes miss her afternoon dose of medication so I have asked her to begin taking them all at one time in the morning (Lialda 4 pills daily).  Reports generalized abdominal soreness and looser stools.  Will start fairly low-dose prednisone at 20 mg daily x5 days, then 15 mg daily x5 days, then 10 mg daily x5 days, then 5 mg daily x5 days.  She is to closely monitor her blood sugar.  She will call us next week with an update.  Prescription was sent to her pharmacy.  She already has a follow-up scheduled with Dr. Carlean Purl in  October as well.  ? Need to reconsider biologic therapy.   CC:  Rutherford Guys, MD  Agree with evaluation and management.  Gatha Mayer, MD, [facility]

## 2020-05-25 NOTE — Patient Instructions (Signed)
If you are age 58 or older, your body mass index should be between 23-30. Your Body mass index is 27.98 kg/m. If this is out of the aforementioned range listed, please consider follow up with your Primary Care Provider.  If you are age 29 or younger, your body mass index should be between 19-25. Your Body mass index is 27.98 kg/m. If this is out of the aformentioned range listed, please consider follow up with your Primary Care Provider.   We have sent the following medications to your pharmacy for you to pick up at your convenience: Prednisone taper

## 2020-05-27 ENCOUNTER — Other Ambulatory Visit: Payer: Self-pay | Admitting: Registered Nurse

## 2020-05-27 DIAGNOSIS — R059 Cough, unspecified: Secondary | ICD-10-CM

## 2020-05-31 ENCOUNTER — Other Ambulatory Visit: Payer: Self-pay

## 2020-05-31 ENCOUNTER — Encounter: Payer: Self-pay | Admitting: Family Medicine

## 2020-05-31 ENCOUNTER — Ambulatory Visit (INDEPENDENT_AMBULATORY_CARE_PROVIDER_SITE_OTHER): Payer: Medicare Other | Admitting: Family Medicine

## 2020-05-31 ENCOUNTER — Encounter: Payer: Self-pay | Admitting: Registered Nurse

## 2020-05-31 VITALS — BP 128/80 | HR 72 | Temp 98.2°F | Ht 62.0 in | Wt 158.0 lb

## 2020-05-31 DIAGNOSIS — M797 Fibromyalgia: Secondary | ICD-10-CM

## 2020-05-31 DIAGNOSIS — E1165 Type 2 diabetes mellitus with hyperglycemia: Secondary | ICD-10-CM | POA: Diagnosis not present

## 2020-05-31 DIAGNOSIS — Z794 Long term (current) use of insulin: Secondary | ICD-10-CM | POA: Diagnosis not present

## 2020-05-31 DIAGNOSIS — M549 Dorsalgia, unspecified: Secondary | ICD-10-CM

## 2020-05-31 DIAGNOSIS — K5100 Ulcerative (chronic) pancolitis without complications: Secondary | ICD-10-CM

## 2020-05-31 DIAGNOSIS — M542 Cervicalgia: Secondary | ICD-10-CM | POA: Diagnosis not present

## 2020-05-31 MED ORDER — BASAGLAR KWIKPEN 100 UNIT/ML ~~LOC~~ SOPN
30.0000 [IU] | PEN_INJECTOR | Freq: Every day | SUBCUTANEOUS | 3 refills | Status: DC
Start: 2020-05-31 — End: 2020-07-23

## 2020-05-31 MED ORDER — METHOCARBAMOL 500 MG PO TABS: 500.0000 mg | ORAL_TABLET | Freq: Three times a day (TID) | ORAL | 1 refills | Status: DC | PRN

## 2020-05-31 NOTE — Patient Instructions (Signed)
° ° ° °  If you have lab work done today you will be contacted with your lab results within the next 2 weeks.  If you have not heard from us then please contact us. The fastest way to get your results is to register for My Chart. ° ° °IF you received an x-ray today, you will receive an invoice from [facility]. Please contact [facility] at [phone] with questions or concerns regarding your invoice.  ° °IF you received labwork today, you will receive an invoice from [facility]. Please contact [facility] at [phone] with questions or concerns regarding your invoice.  ° °Our billing staff will not be able to assist you with questions regarding bills from these companies. ° °You will be contacted with the lab results as soon as they are available. The fastest way to get your results is to activate your My Chart account. Instructions are located on the last page of this paperwork. If you have not heard from us regarding the results in 2 weeks, please contact this office. °  ° ° ° °

## 2020-05-31 NOTE — Progress Notes (Signed)
Established Patient Office Visit  Subjective:  Patient ID: Beverly Ramirez, female    DOB: 1960/09/30  Age: 59 y.o. MRN: [id number]  CC:  Chief Complaint  Patient presents with   Dizziness    patient states she has been experiencing some dizziness , cough and headaches for four days all theb other symptoms for about 8 days. She was taking OTC medications     HPI Beverly Ramirez presents for dizzines, cough, headaches on and off for around 8 days  Slow onset Dizziness is mild, no falls Cough is dry, nonproductive. Lungs sound a little wheezy at times Headaches described as frontal or band like around head Denies known exposure to covid, nvd, change to taste or smell, fever, chill, fatigue, myalgia  No other symptoms  Past Medical History:  Diagnosis Date   Anemia    Carpal tunnel syndrome    bilateral   Chronic heel pain    Diabetes mellitus without complication (HCC)    DJD (degenerative joint disease) of cervical spine    MRI 2017   Hyperlipidemia    Plantar fasciitis    bilateral   Sickle cell anemia (HCC)    sickle cell trait   Sleep apnea    Tendonitis    left hand/wrist   UC (ulcerative colitis) ([place])     Past Surgical History:  Procedure Laterality Date   ABDOMINAL HYSTERECTOMY     APPENDECTOMY     CESAREAN SECTION     x4   COLONOSCOPY     Multiple in [place]   ESOPHAGOGASTRODUODENOSCOPY      Family History  Problem Relation Age of Onset   Stroke Mother    Diabetes Mother    Hypertension Mother    Lung cancer Mother    Sickle cell trait Mother    Diabetes Maternal Aunt    Lung cancer Maternal Aunt    Lung cancer Maternal Aunt    Lung cancer Maternal Aunt    Colon cancer Cousin 30   Sickle cell trait Father    Sickle cell anemia Sister 32   Lung cancer Maternal Grandmother    Sickle cell anemia Sister    Other Sister        accident and blood clot formed    Social History   Socioeconomic  History   Marital status: Married    Spouse name: Simona Huh   Number of children: 4   Years of education: Not on file   Highest education level: Not on file  Occupational History   Occupation: retired    Comment: He formerly worked for [employer], disabled due to a fall  Tobacco Use   Smoking status: Former Smoker    Types: Cigarettes    Quit date: 10/13/2006    Years since quitting: 13.6   Smokeless tobacco: Never Used  Vaping Use   Vaping Use: Never used  Substance and Sexual Activity   Alcohol use: Yes    Comment: occasional   Drug use: No   Sexual activity: Yes    Partners: Male    Comment: 1ST intercourse- 3, partners- 82, married- 42 yrs   Other Topics Concern   Not on file  Social History Narrative   She is married, 3 sons born 38, 87, 42.  One daughter born in 22.   She is medically retired from the [employer] after a fall and a hip injury, around 2004.   Caffeinated beverage every other day  x1   07/13/2017   Social Determinants of Health   Financial Resource Strain:    Difficulty of Paying Living Expenses: Not on file  Food Insecurity:    Worried About Charity fundraiser in the Last Year: Not on file   [employer] of Food in the Last Year: Not on file  Transportation Needs:    Lack of Transportation (Medical): Not on file   Lack of Transportation (Non-Medical): Not on file  Physical Activity:    Days of Exercise per Week: Not on file   Minutes of Exercise per Session: Not on file  Stress:    Feeling of Stress : Not on file  Social Connections:    Frequency of Communication with Friends and Family: Not on file   Frequency of Social Gatherings with Friends and Family: Not on file   Attends Religious Services: Not on file   Active Member of Clubs or Organizations: Not on file   Attends Archivist Meetings: Not on file   Marital Status: Not on file  Intimate Partner Violence:      Fear of Current or Ex-Partner: Not on file   Emotionally Abused: Not on file   Physically Abused: Not on file   Sexually Abused: Not on file    Outpatient Medications Prior to Visit  Medication Sig Dispense Refill   amLODipine (NORVASC) 5 MG tablet Take 5 mg by mouth at bedtime.      atorvastatin (LIPITOR) 40 MG tablet Take 1 tablet (40 mg total) by mouth daily. 90 tablet 3   Lancets (ONETOUCH DELICA PLUS UMPNTI14E) MISC 1 each by Does not apply route 3 (three) times daily. Dx E11.65, z79.4 100 each 11   acetaminophen (TYLENOL) 500 MG tablet Take 500 mg by mouth every 6 (six) hours as needed for mild pain or headache.      cyclobenzaprine (FLEXERIL) 10 MG tablet Take 1 tablet (10 mg total) by mouth 3 (three) times daily as needed for muscle spasms. 30 tablet 4   DULoxetine (CYMBALTA) 60 MG capsule Take 1 capsule (60 mg total) by mouth daily. 90 capsule 1   fluticasone (FLONASE) 50 MCG/ACT nasal spray SPRAY 2 SPRAYS INTO EACH NOSTRIL EVERY DAY (Patient taking differently: Place 2 sprays into both nostrils daily. ) 16 mL 2   glucose blood (ONETOUCH VERIO) test strip Use as instructed 100 each 12   insulin aspart (NOVOLOG FLEXPEN) 100 UNIT/ML FlexPen Inject 15 Units into the skin 3 (three) times daily with meals. 15 mL 11   Insulin Glargine (BASAGLAR KWIKPEN) 100 UNIT/ML Inject 0.4 mLs (40 Units total) into the skin at bedtime. 15 mL 0   Insulin Syringe-Needle U-100 (INSULIN SYRINGE 1CC/31GX5/16") 31G X 5/16" 1 ML MISC Inject 0.08 ( 8 Units)  into the skin 3 times daily before meals. 100 each 3   metFORMIN (GLUCOPHAGE) 500 MG tablet Take 1 tablet (500 mg total) by mouth 2 (two) times daily with a meal. TAKE 1 TABLET BY MOUTH TWICE DAILY FOR TWO WEEKS. THEN TAKE 2 TABLETS BY MOUTH TWICE DAILY. 180 tablet 2   NONFORMULARY OR COMPOUNDED ITEM Swish and spit 5 mLs as needed (ulcer flare up in mouth). Magic mouthwash with lidocaine     Olopatadine HCl 0.2 % SOLN Apply 1 drop to eye  daily. 2.5 mL 11   mesalamine (LIALDA) 1.2 g EC tablet Take 2 tablets (2.4 g total) by mouth 2 (two) times daily. (Patient taking differently: Take 4.8 g by mouth daily with  breakfast. ) 360 tablet 3   No facility-administered medications prior to visit.    Allergies  Allergen Reactions   Codeine Itching, Rash and Hives   Latex Hives    With the power    ROS Review of Systems  Constitutional: Negative.   HENT: Positive for sinus pressure.   Eyes: Negative.   Respiratory: Positive for cough, chest tightness and wheezing.   Cardiovascular: Negative.   Gastrointestinal: Negative.   Genitourinary: Negative.   Musculoskeletal: Negative.   Skin: Negative.   Neurological: Positive for headaches.  Psychiatric/Behavioral: Negative.       Objective:    Physical Exam Vitals and nursing note reviewed.  Constitutional:      General: She is not in acute distress.    Appearance: Normal appearance. She is normal weight. She is not ill-appearing, toxic-appearing or diaphoretic.  HENT:     Nose: Congestion present.  Cardiovascular:     Rate and Rhythm: Normal rate and regular rhythm.     Heart sounds: Normal heart sounds. No murmur heard.  No friction rub. No gallop.   Pulmonary:     Effort: Pulmonary effort is normal. No respiratory distress.     Breath sounds: No stridor. Wheezing present. No rhonchi or rales.  Chest:     Chest wall: No tenderness.  Skin:    General: Skin is warm and dry.  Neurological:     General: No focal deficit present.     Mental Status: She is alert and oriented to person, place, and time. Mental status is at baseline.  Psychiatric:        Mood and Affect: Mood normal.        Behavior: Behavior normal.        Thought Content: Thought content normal.        Judgment: Judgment normal.     BP 137/90    Pulse 78    Temp 98.1 F (36.7 C) (Temporal)    Resp 18    Ht 5' 2"  (1.575 m)    Wt 157 lb 3.2 oz (71.3 kg)    SpO2 97%    BMI 28.75 kg/m  Wt  Readings from Last 3 Encounters:  05/31/20 158 lb (71.7 kg)  05/25/20 153 lb (69.4 kg)  05/07/20 153 lb (69.4 kg)     Health Maintenance Due  Topic Date Due   COVID-19 Vaccine (1) Never done    There are no preventive care reminders to display for this patient.  Lab Results  Component Value Date   TSH 1.260 07/25/2019   Lab Results  Component Value Date   WBC 10.5 05/07/2020   HGB 12.6 05/07/2020   HCT 37.9 05/07/2020   MCV 86.7 05/07/2020   PLT 354 05/07/2020   Lab Results  Component Value Date   NA 138 05/07/2020   K 3.3 (L) 05/07/2020   CO2 25 05/07/2020   GLUCOSE 110 (H) 05/07/2020   BUN 12 05/07/2020   CREATININE 0.86 05/07/2020   BILITOT 0.4 05/07/2020   ALKPHOS 120 05/07/2020   AST 38 05/07/2020   ALT 49 (H) 05/07/2020   PROT 7.4 05/07/2020   ALBUMIN 3.7 05/07/2020   CALCIUM 8.9 05/07/2020   ANIONGAP 9 05/07/2020   GFR 96.19 08/16/2019   Lab Results  Component Value Date   CHOL 250 (H) 02/02/2020   Lab Results  Component Value Date   HDL 40 02/02/2020   Lab Results  Component Value Date   LDLCALC 179 (H) 02/02/2020  Lab Results  Component Value Date   TRIG 167 (H) 02/02/2020   Lab Results  Component Value Date   CHOLHDL 6.3 (H) 02/02/2020   Lab Results  Component Value Date   HGBA1C 7.1 (H) 04/20/2020      Assessment & Plan:   Problem List Items Addressed This Visit    None    Visit Diagnoses    Cough    -  Primary   Relevant Orders   DG Chest 2 View (Completed)   CBC (Completed)   Comprehensive metabolic panel (Completed)   Hemoglobin A1c (Completed)   Brain natriuretic peptide (Completed)   Type 2 diabetes mellitus with hyperglycemia, with long-term current use of insulin (HCC)       Relevant Orders   Comprehensive metabolic panel (Completed)   Hemoglobin A1c (Completed)   Other form of dyspnea       Relevant Orders   Brain natriuretic peptide (Completed)      Meds ordered this encounter  Medications    DISCONTD: albuterol (VENTOLIN HFA) 108 (90 Base) MCG/ACT inhaler    Sig: Inhale 2 puffs into the lungs every 6 (six) hours as needed for wheezing or shortness of breath.    Dispense:  18 g    Refill:  3    Order Specific Question:   Supervising Provider    Answer:   Carlota Raspberry, JEFFREY R [[id number]]   DISCONTD: montelukast (SINGULAIR) 10 MG tablet    Sig: Take 1 tablet (10 mg total) by mouth at bedtime.    Dispense:  30 tablet    Refill:  3    Order Specific Question:   Supervising Provider    Answer:   Carlota Raspberry, JEFFREY R [[id number]]    Follow-up: No follow-ups on file.   PLAN  cxr shows no evidence of infectious process  Asthma vs allergies - will tx with albuterol inhaler and singulair  Return precautions reviewed  Patient encouraged to call clinic with any questions, comments, or concerns.   Maximiano Coss, NP

## 2020-05-31 NOTE — Progress Notes (Signed)
9/23/202111:30 AM  Beverly Ramirez [birth year], 59 y.o., female [id number]  Chief Complaint  Patient presents with  . Diabetes    1 month follow up - has been checking BS at home on prednisone taper x 1 week for cholitis     HPI:   Patient is a 59 y.o. female with past medical history significant for ulcerative colitis, HLP, HTN, DM2, fibromyalgiawho presents today for routine followup  Last OV a month ago - restarted basaglar 10 units qhs and novolog AC meals Saw GI sept 17 - changed lialda to AM only dosing, low pred 4 week taper, next appt oct  She reports her ulcers and stools are getting better - eating more She has been taking all mesalamine in AM - sign improved compliance She is doing basaglar 25 units at bedtime novolog 20 units TID AC meals She is checking fasting in mid 200s Before dinner cbgs better, 180-190s Denies any low cbgs She is having several weeks of bilateral neck and upper and mid back pain - she has been taking muscle relaxant prn as too sedating Some tingling upper shoulders, but no radiating pain/tingling/numbnes down arms, no focal weakness Gabapentin is too sedating MRI cervical spine in 2019: IMPRESSION: 1. C5-C6 disc and endplate degeneration in the setting of mild retrolisthesis at that level. No spinal stenosis, but moderate left and mild right C6 neural foraminal stenosis. 2. Mild cervical spine degeneration elsewhere. Borderline to mild C8 neural foraminal stenosis greater on the left. Reports fibromyalgia has been well controlled on duloxetine  Wt Readings from Last 3 Encounters:  05/31/20 158 lb (71.7 kg)  05/25/20 153 lb (69.4 kg)  05/07/20 153 lb (69.4 kg)   BP Readings from Last 3 Encounters:  05/31/20 128/80  05/25/20 118/80  05/08/20 138/82    Lab Results  Component Value Date   HGBA1C 7.1 (H) 04/20/2020   HGBA1C 8.0 (H) 03/02/2020   HGBA1C 9.3 (H) 02/02/2020   Lab Results  Component Value Date   LDLCALC 179 (H)  02/02/2020   CREATININE 0.86 05/07/2020    Depression screen PHQ 2/9 05/31/2020 04/13/2020 03/02/2020  Decreased Interest 0 0 0  Down, Depressed, Hopeless 0 0 0  PHQ - 2 Score 0 0 0  Some recent data might be hidden    Fall Risk  05/31/2020 05/04/2020 04/13/2020 03/13/2020 03/02/2020  Falls in the past year? 0 0 0 0 0  Number falls in past yr: 0 0 - 0 0  Injury with Fall? 0 0 - 0 0  Follow up Falls evaluation completed - - - Falls evaluation completed     Allergies  Allergen Reactions  . Codeine Itching, Rash and Hives  . Latex Hives    With the power    Prior to Admission medications   Medication Sig Start Date End Date Taking? Authorizing Provider  albuterol (VENTOLIN HFA) 108 (90 Base) MCG/ACT inhaler Inhale 2 puffs into the lungs every 6 (six) hours as needed for wheezing or shortness of breath. 03/13/20  Yes Rutherford Guys, MD  amLODipine (NORVASC) 5 MG tablet Take 5 mg by mouth at bedtime.  12/15/19  Yes [provider]  atorvastatin (LIPITOR) 40 MG tablet Take 1 tablet (40 mg total) by mouth daily. 02/10/20  Yes Rutherford Guys, MD  cyclobenzaprine (FLEXERIL) 10 MG tablet Take 1 tablet (10 mg total) by mouth 3 (three) times daily as needed for muscle spasms. 09/26/19  Yes Rutherford Guys, MD  dicyclomine (BENTYL) 20 MG tablet  Take 1 tablet (20 mg total) by mouth 3 (three) times daily as needed for spasms (abdominal cramping). 05/08/20  Yes Long, Wonda Olds, MD  diphenoxylate-atropine (LOMOTIL) 2.5-0.025 MG tablet Take 1 tablet by mouth 4 (four) times daily as needed for diarrhea or loose stools. 04/27/20  Yes Gatha Mayer, MD  DULoxetine (CYMBALTA) 60 MG capsule TAKE 1 CAPSULE BY MOUTH EVERY DAY 03/20/20  Yes Rutherford Guys, MD  fluticasone [facility]) 50 MCG/ACT nasal spray SPRAY 2 SPRAYS INTO EACH NOSTRIL EVERY DAY 03/08/20  Yes Rutherford Guys, MD  glucose blood (ONETOUCH VERIO) test strip Check blood glucose 3-4 x day. Dx E11.9, E11.649, z79.4, 05/04/20  Yes Rutherford Guys, MD    insulin aspart (NOVOLOG FLEXPEN) 100 UNIT/ML FlexPen Inject 20 Units into the skin 3 (three) times daily with meals. 05/04/20  Yes Rutherford Guys, MD  Insulin Glargine [facility]) 100 UNIT/ML Inject 0.2 mLs (20 Units total) into the skin at bedtime. 05/04/20  Yes Rutherford Guys, MD  Insulin Pen Needle (PEN NEEDLES) 32G X 6 MM MISC Use to inject insulin four times a day. Dx E11.9, z79.4 05/04/20  Yes Rutherford Guys, MD  Lancets (ONETOUCH DELICA PLUS [id number]) [place] 1 each by Does not apply route 3 (three) times daily. Dx E11.65, z79.4 01/24/19  Yes Rutherford Guys, MD  lidocaine (XYLOCAINE) 2 % solution Use as directed 15 mLs in the mouth or throat as needed for mouth pain. 03/09/20  Yes Gatha Mayer, MD  mesalamine (LIALDA) 1.2 g EC tablet Take 2 tablets (2.4 g total) by mouth 2 (two) times daily. 04/28/20 04/23/21 Yes Gatha Mayer, MD  montelukast (SINGULAIR) 10 MG tablet TAKE 1 TABLET BY MOUTH EVERYDAY AT BEDTIME 05/27/20  Yes Maximiano Coss, NP  Multiple Vitamins-Minerals (WOMENS 50+ MULTI VITAMIN/MIN PO) Take by mouth.   Yes [provider]  NONFORMULARY OR COMPOUNDED ITEM Swish and spit 5 mLs as needed (ulcer flare up in mouth). Magic mouthwash with lidocaine disp#150 ml 05/04/20  Yes Gatha Mayer, MD  ondansetron (ZOFRAN ODT) 4 MG disintegrating tablet Take 1 tablet (4 mg total) by mouth every 8 (eight) hours as needed for nausea or vomiting. 05/08/20  Yes Long, Wonda Olds, MD  predniSONE (DELTASONE) 10 MG tablet Take 2 tablets (20 mg total) by mouth daily with breakfast for 5 days, THEN 1.5 tablets (15 mg total) daily with breakfast for 5 days, THEN 1 tablet (10 mg total) daily with breakfast for 5 days, THEN 0.5 tablets (5 mg total) daily with breakfast for 5 days. 05/25/20 06/14/20 Yes Zehr, Laban Emperor, PA-C  triamcinolone cream (KENALOG) 0.1 % Apply 1 application topically 2 (two) times daily. 04/19/20  Yes Rutherford Guys, MD    Past Medical History:  Diagnosis Date  .  Anemia   . Carpal tunnel syndrome    bilateral  . Chronic heel pain   . Diabetes mellitus without complication ([place])   . DJD (degenerative joint disease) of cervical spine    MRI 2017  . Hyperlipidemia   . Plantar fasciitis    bilateral  . Sickle cell anemia (HCC)    sickle cell trait  . Sleep apnea   . Tendonitis    left hand/wrist  . UC (ulcerative colitis) ([place])     Past Surgical History:  Procedure Laterality Date  . ABDOMINAL HYSTERECTOMY    . APPENDECTOMY    . CESAREAN SECTION     x4  . COLONOSCOPY  Multiple in [place]  . ESOPHAGOGASTRODUODENOSCOPY      Social History   Tobacco Use  . Smoking status: Former Smoker    Types: Cigarettes    Quit date: 10/13/2006    Years since quitting: 13.6  . Smokeless tobacco: Never Used  Substance Use Topics  . Alcohol use: Yes    Comment: occasional    Family History  Problem Relation Age of Onset  . Stroke Mother   . Diabetes Mother   . Hypertension Mother   . Lung cancer Mother   . Sickle cell trait Mother   . Diabetes Maternal Aunt   . Lung cancer Maternal Aunt   . Lung cancer Maternal Aunt   . Lung cancer Maternal Aunt   . Colon cancer Cousin 65  . Sickle cell trait Father   . Sickle cell anemia Sister 28  . Lung cancer Maternal Grandmother   . Sickle cell anemia Sister   . Other Sister        accident and blood clot formed    Review of Systems  Constitutional: Negative for chills and fever.  Respiratory: Negative for cough and shortness of breath.   Cardiovascular: Negative for chest pain, palpitations and leg swelling.  Gastrointestinal: Negative for abdominal pain, nausea and vomiting.     OBJECTIVE:  Today's Vitals   05/31/20 1107 05/31/20 1117  BP: (!) 143/83 128/80  Pulse: 72   Temp: 98.2 F (36.8 C)   SpO2: 100%   Weight: 158 lb (71.7 kg)   Height: 5' 2"  (1.575 m)    Body mass index is 28.9 kg/m.   Physical Exam Vitals and nursing note reviewed.  Constitutional:       Appearance: She is well-developed.  HENT:     Head: Normocephalic and atraumatic.     Mouth/Throat:     Pharynx: No oropharyngeal exudate.  Eyes:     General: No scleral icterus.    Extraocular Movements: Extraocular movements intact.     Conjunctiva/sclera: Conjunctivae normal.     Pupils: Pupils are equal, round, and reactive to light.  Cardiovascular:     Rate and Rhythm: Normal rate and regular rhythm.     Heart sounds: Normal heart sounds. No murmur heard.  No friction rub. No gallop.   Pulmonary:     Effort: Pulmonary effort is normal.     Breath sounds: Normal breath sounds. No wheezing, rhonchi or rales.  Musculoskeletal:     Cervical back: Neck supple. Spasms present. Muscular tenderness present. No spinous process tenderness. Normal range of motion.     Thoracic back: Spasms and tenderness present. No bony tenderness. Normal range of motion.  Lymphadenopathy:     Cervical: No cervical adenopathy.  Skin:    General: Skin is warm and dry.  Neurological:     Mental Status: She is alert and oriented to person, place, and time.     No results found for this or any previous visit (from the past 24 hour(s)).  No results found.   ASSESSMENT and PLAN  1. Type 2 diabetes mellitus with hyperglycemia, with long-term current use of insulin (HCC) Fasting cbgs above goal, increase basaglar to 30 units at bedtime. Titrate per cbgs as pred dose will start decreasing early next week. Referring to endo for further eval and treatment. - Ambulatory referral to Endocrinology  2. Ulcerative pancolitis without complication ([place]) Managed by GI, currently on prolonged low dose steroids.   3. Bilateral neck pain 4. Upper back pain Cont  with supportive measures. D/c flexeril. Trial of robaxin to see if less sedative. Referring to PT. - Ambulatory referral to Physical Therapy  5. Fibromyalgia Stable on cymbalta  Other orders - Insulin Glargine (BASAGLAR KWIKPEN) 100 UNIT/ML; Inject  30 Units into the skin at bedtime.  Return in about 3 months (around 08/30/2020) for [facility] - Dr Carlota Raspberry - DM2/HTN/HLP/fibromylagia/neck pain/UC.    Rutherford Guys, MD Primary [facility] at [facility] [place], [place] [address] Ph.  [phone] Fax [phone]

## 2020-06-01 ENCOUNTER — Encounter: Payer: Self-pay | Admitting: Dietician

## 2020-06-01 ENCOUNTER — Encounter: Payer: Medicare Other | Attending: Family Medicine | Admitting: Dietician

## 2020-06-01 DIAGNOSIS — Z794 Long term (current) use of insulin: Secondary | ICD-10-CM

## 2020-06-01 DIAGNOSIS — E1165 Type 2 diabetes mellitus with hyperglycemia: Secondary | ICD-10-CM | POA: Diagnosis not present

## 2020-06-01 DIAGNOSIS — E118 Type 2 diabetes mellitus with unspecified complications: Secondary | ICD-10-CM | POA: Insufficient documentation

## 2020-06-01 NOTE — Patient Instructions (Addendum)
Consider asking your doctor for a prescription for glucagon.  Glucagon is used to raise your blood sugar in the event of an emergency low when you are unable to drink something with sugar.  Examples of Glucagon are   1.  Glucogon Hypokit  2.  Baqsimi  3.  Gvoke HypoPen  Treat a low blood sugar less than 70 with 15 grams of fast acting carbohydrates, recheck in 15 minutes and if less than 70 repeat.  If your blood sugar is greater than 70 then have a snack or meal with protein.  Follow a low fiber diet when your colitis is flaring then slowly resume when you are improved.  Continue to be very mindful about your sugar intake.

## 2020-06-01 NOTE — Progress Notes (Signed)
Diabetes Self-Management Education  Visit Type: Follow-up  Appt. Start Time: 1115 Appt. End Time: 1150  06/01/2020  Beverly Ramirez, identified by name and date of birth, is a 59 y.o. female with a diagnosis of Diabetes: Type 2.   ASSESSMENT Patient is here today alone.  She was last seen by myself 04/13/2020. Patient states that she feels so much better when she does not drink sugar.  She states that she is careful with fruit as she also notices that this makes her blood sugar rise.  Patient reports that she has had a flare up of her colitis and is now on Prednisone.   Her blood sugar was low (67) this am and she did not take her Novolog.  She treated with 1 cup or OJ and her blood sugar increased to 191.  Discussed avoiding overtreating low blood glucose and reviewed the rule of 15. She does not have a glucagon prescription nor knowledge about what this is.    History includes Type 2 Diabetes, OSA (just diagnosed and to pick up a c-pap), fibromyalgia, ulcerative colitis A1C 7.1% 04/20/20, 8% 03/02/2020, 9.3% 02/02/2020 Medications include:  Basaglar 30 units q HS, Novolog 25 units before each meal  Weight hx:   158 lbs 05/31/2020 167 lbs end of May 2021 decreased to 147 lbs with illness in May. 154 lbs 04/13/2020  Patient lives with her husband and daughter and moved from [place] about 3 years ago.  She worked for the [employer].   She does the shopping and her and her daughter share the cooking.  24 hour recall: Decreased red meat due to colitis Mild chronic nausea that worsens when she eats. Tolerates small meals best Tolerates dairy but with increased gas.  Buys Lactaid which help She has an air fryer but has not learned how to cook using this.  Breakfast:  Regular oatmeal, 1 tsp sugar, white or [employer] toast with butter Lunch:  Chicken sandwich OR salad (chicken salad or tuna salad over greens) Dinner:  Salad OR chicken or baked fish or brown rice or baked  sweet potato with butter, vegetables Snack:  Occasional nabs Beverages:  Water, diet tea, mio, occasional diluted gingerale  Exercise:  Stretches for back and to start PT, walks the dog daily 20-30 minutes  Weight 158 lb (71.7 kg). Body mass index is 28.9 kg/m.   Diabetes Self-Management Education - 06/01/20 1600      Visit Information   Visit Type Follow-up      Initial Visit   Diabetes Type Type 2    Are you currently following a meal plan? Yes   diabetic   Are you taking your medications as prescribed? Yes      Complications   Last HgB A1C per patient/outside source 7.1 %    How often do you check your blood sugar? 1-2 times/day    Postprandial Blood glucose range (mg/dL) 70-129;130-179    Number of hyperglycemic episodes per week 1    Can you tell when your blood sugar is high? Yes    What do you do if your blood sugar is high? OJ      Patient Education   Previous Diabetes Education Yes (please comment)   04/2020   Nutrition management  Role of diet in the treatment of diabetes and the relationship between the three main macronutrients and blood glucose level;Meal options for control of blood glucose level and chronic complications.;Other (comment)   tips due to colitis  Medications Reviewed patients medication for diabetes, action, purpose, timing of dose and side effects.    Monitoring Identified appropriate SMBG and/or A1C goals.    Acute complications Taught treatment of hypoglycemia - the 15 rule.    Psychosocial adjustment Worked with patient to identify barriers to care and solutions      Individualized Goals (developed by patient)   Nutrition Follow meal plan discussed    Physical Activity Exercise 3-5 times per week    Medications take my medication as prescribed    Monitoring  test my blood glucose as discussed    Reducing Risk examine blood glucose patterns    Health Coping discuss diabetes with (comment)   MD, RD, CDCES     Patient Self-Evaluation of Goals  - Patient rates self as meeting previously set goals (% of time)   Nutrition >75%    Physical Activity >75%    Medications >75%    Monitoring >75%    Problem Solving >75%    Reducing Risk >75%    Health Coping >75%      Post-Education Assessment   Patient understands the diabetes disease and treatment process. Demonstrates understanding / competency    Patient understands incorporating nutritional management into lifestyle. Needs Review    Patient undertands incorporating physical activity into lifestyle. Needs Review    Patient understands using medications safely. Demonstrates understanding / competency    Patient understands monitoring blood glucose, interpreting and using results Needs Review    Patient understands prevention, detection, and treatment of acute complications. Demonstrates understanding / competency    Patient understands prevention, detection, and treatment of chronic complications. Demonstrates understanding / competency    Patient understands how to develop strategies to address psychosocial issues. Needs Review    Patient understands how to develop strategies to promote health/change behavior. Needs Review      Outcomes   Expected Outcomes Demonstrated interest in learning. Expect positive outcomes    Future DMSE 2 months    Program Status Not Completed      Subsequent Visit   Since your last visit have you continued or begun to take your medications as prescribed? Yes    Since your last visit have you had your blood pressure checked? Yes    Since your last visit, are you checking your blood glucose at least once a day? Yes           Individualized Plan for Diabetes Self-Management Training:   Learning Objective:  Patient will have a greater understanding of diabetes self-management. Patient education plan is to attend individual and/or group sessions per assessed needs and concerns.   Plan:   Patient Instructions  Consider asking your doctor for a  prescription for glucagon.  Glucagon is used to raise your blood sugar in the event of an emergency low when you are unable to drink something with sugar.  Examples of Glucagon are   1.  Glucogon Hypokit  2.  Baqsimi  3.  Gvoke HypoPen  Treat a low blood sugar less than 70 with 15 grams of fast acting carbohydrates, recheck in 15 minutes and if less than 70 repeat.  If your blood sugar is greater than 70 then have a snack or meal with protein.  Follow a low fiber diet when your colitis is flaring then slowly resume when you are improved.  Continue to be very mindful about your sugar intake.       Expected Outcomes:  Demonstrated interest in learning. Expect positive outcomes  Education material provided: Inflammatory Bowel Disease from [facility]  If problems or questions, patient to contact team via:  Phone  Future DSME appointment: 2 months

## 2020-06-08 ENCOUNTER — Other Ambulatory Visit: Payer: Self-pay | Admitting: Family Medicine

## 2020-06-08 DIAGNOSIS — Z20822 Contact with and (suspected) exposure to covid-19: Secondary | ICD-10-CM | POA: Diagnosis not present

## 2020-06-08 NOTE — Telephone Encounter (Signed)
   Notes to clinic Historical Provider

## 2020-06-21 ENCOUNTER — Ambulatory Visit: Payer: Medicare Other | Attending: Family Medicine | Admitting: Physical Therapy

## 2020-06-21 DIAGNOSIS — R2689 Other abnormalities of gait and mobility: Secondary | ICD-10-CM | POA: Insufficient documentation

## 2020-06-21 DIAGNOSIS — M6281 Muscle weakness (generalized): Secondary | ICD-10-CM | POA: Insufficient documentation

## 2020-06-21 DIAGNOSIS — M545 Low back pain, unspecified: Secondary | ICD-10-CM | POA: Insufficient documentation

## 2020-06-21 DIAGNOSIS — M542 Cervicalgia: Secondary | ICD-10-CM | POA: Insufficient documentation

## 2020-06-21 DIAGNOSIS — G8929 Other chronic pain: Secondary | ICD-10-CM | POA: Insufficient documentation

## 2020-06-26 ENCOUNTER — Other Ambulatory Visit: Payer: Self-pay

## 2020-06-26 ENCOUNTER — Ambulatory Visit: Payer: Medicare Other | Admitting: Physical Therapy

## 2020-06-26 ENCOUNTER — Encounter: Payer: Self-pay | Admitting: Physical Therapy

## 2020-06-26 DIAGNOSIS — G8929 Other chronic pain: Secondary | ICD-10-CM

## 2020-06-26 DIAGNOSIS — M6281 Muscle weakness (generalized): Secondary | ICD-10-CM

## 2020-06-26 DIAGNOSIS — M542 Cervicalgia: Secondary | ICD-10-CM | POA: Diagnosis not present

## 2020-06-26 DIAGNOSIS — M545 Low back pain, unspecified: Secondary | ICD-10-CM | POA: Diagnosis not present

## 2020-06-26 DIAGNOSIS — R2689 Other abnormalities of gait and mobility: Secondary | ICD-10-CM | POA: Diagnosis not present

## 2020-06-26 NOTE — Therapy (Signed)
[place], [place], [address] Phone: [phone]   Fax:  [phone]  Physical Therapy Evaluation  Patient Details  Name: Beverly Ramirez MRN: [id number] Date of Birth: March 16, 1961 Referring Provider (PT): Rutherford Guys, MD (PT order sent to Maximiano Coss NP)   Encounter Date: 06/26/2020   PT End of Session - 06/26/20 1104    Visit Number 1    Number of Visits 8    Date for PT Re-Evaluation 08/21/20    Authorization Type MCR    Authorization Time Period FOTO by 6th, KX by 15th    Progress Note Due on Visit 10    PT Start Time 1045    PT Stop Time 1130    PT Time Calculation (min) 45 min    Activity Tolerance Patient tolerated treatment well    Behavior During Therapy [facility] for tasks assessed/performed           Past Medical History:  Diagnosis Date  . Anemia   . Carpal tunnel syndrome    bilateral  . Chronic heel pain   . Diabetes mellitus without complication ([place])   . DJD (degenerative joint disease) of cervical spine    MRI 2017  . Hyperlipidemia   . Plantar fasciitis    bilateral  . Sickle cell anemia (HCC)    sickle cell trait  . Sleep apnea   . Tendonitis    left hand/wrist  . UC (ulcerative colitis) ([place])     Past Surgical History:  Procedure Laterality Date  . ABDOMINAL HYSTERECTOMY    . APPENDECTOMY    . CESAREAN SECTION     x4  . COLONOSCOPY     Multiple in [place]  . ESOPHAGOGASTRODUODENOSCOPY      There were no vitals filed for this visit.    Subjective Assessment - 06/26/20 1048    Subjective Patient reports she has been having neck and back pain for weeks, also having headaches. She was taking muscle relaxers but they put her to sleep, and then whenever she starts moving the pain comes back. She does note an accident in 2008 where she feel and has pinched nerve and bulging discs. She has trouble with all lifting tasks, bending is difficult, sometimes she  can hardly turn her neck. She was given some stretches to help her in the morning but has been unable recently because back hurts too bad. She has previously had PT, epidural injections, acupuncture, and pain management for these issues.    Limitations Sitting;Lifting;Standing;Walking;House hold activities    How long can you sit comfortably? "A couple minutes"    How long can you stand comfortably? 30 minutes    How long can you walk comfortably? 15 minutes    Currently in Pain? Yes    Pain Score 8     Pain Location Neck    Pain Orientation Right    Pain Descriptors / Indicators Throbbing;Tender    Pain Type Chronic pain    Pain Radiating Towards can cause heachaches    Pain Onset More than a month ago    Pain Frequency Constant    Aggravating Factors  Constant moving, turning to the left    Pain Relieving Factors Medication    Multiple Pain Sites Yes    Pain Score 9    Pain Location Back    Pain Orientation Right;Left;Lower;Mid   R > L   Pain Descriptors / Indicators Aching;Tender;Sore    Pain Type Chronic  pain    Pain Onset More than a month ago    Pain Frequency Constant    Aggravating Factors  Sitting, standing, walking, bending, lifting    Pain Relieving Factors Medication, using hot tub or heating pad    Effect of Pain on Daily Activities Patient limited with walking, lifting, household tasks              [facility] PT Assessment - 06/26/20 0001      Assessment   Medical Diagnosis Chronic neck and back apin    Referring Provider (PT) Rutherford Guys, MD   PT order sent to Maximiano Coss NP   Onset Date/Surgical Date --   patient has long history of neck and back pain   Hand Dominance Right    Next MD Visit Not scheduled    Prior Therapy Yes      Precautions   Precautions None      Restrictions   Weight Bearing Restrictions No      Balance Screen   Has the patient fallen in the past 6 months No    Has the patient had a decrease in activity level because of a fear  of falling?  No    Is the patient reluctant to leave their home because of a fear of falling?  No      Home Environment   Living Environment Private residence    Living Arrangements Spouse/significant other    Type of [place] Access Stairs to enter      Prior Function   Level of Independence Independent    Vocation On disability    Leisure Reading, walking      Cognition   Overall Cognitive Status Within Functional Limits for tasks assessed      Observation/Other Assessments   Observations Patient appears in no apparent distress    Focus on Therapeutic Outcomes (FOTO)  Functional status 48% limitation      Sensation   Light Touch Appears Intact      Coordination   Gross Motor Movements are Fluid and Coordinated Yes      Posture/Postural Control   Posture Comments Patient exhibits rounded shoulder and forward head posture      ROM / Strength   AROM / PROM / Strength AROM;PROM;Strength      AROM   Overall AROM Comments Patient reported bilateral lower back pain with all lumbar movement    AROM Assessment Site Lumbar;Cervical    Cervical Flexion 40    Cervical Extension 35    Cervical - Right Side Bend 25    Cervical - Left Side Bend 15   right neck tightness   Cervical - Right Rotation 50    Cervical - Left Rotation 45   right neck tightness   Lumbar Flexion 50% - fingertips to knees    Lumbar Extension 50%    Lumbar - Right Side Bend 75%    Lumbar - Left Side Bend 75%    Lumbar - Right Rotation 50%    Lumbar - Left Rotation 50%      PROM   Overall PROM Comments Hip PROM grossly WFL      Strength   Overall Strength Comments Periscapular and core strength gross 4-/5 MMT    Strength Assessment Site Hip    Right/Left Hip Right;Left    Right Hip Flexion 4/5    Right Hip Extension 3+/5    Right Hip ABduction 3+/5    Left Hip  Flexion 4-/5    Left Hip Extension 3+/5    Left Hip ABduction 3+/5      Flexibility   Soft Tissue Assessment /Muscle Length  yes   right upper trap and levator limitation   Hamstrings Slightly limited bilaterally    Piriformis Slightly limited bilaterally      Palpation   Spinal mobility Not assessed    Palpation comment TTP right upper trap and levator region with increased muscle tone, bilateral lumbar paraspinals      Special Tests   Other special tests All radicualar testing negative      Transfers   Transfers Independent with all Transfers      Ambulation/Gait   Ambulation/Gait Yes    Ambulation/Gait Assistance 7: Independent    Gait Comments Antalgic on right, trendelenburg bilaterally                      Objective measurements completed on examination: See above findings.       [place] Adult PT Treatment/Exercise - 06/26/20 0001      Exercises   Exercises Neck;Lumbar      Neck Exercises: Seated   Other Seated Exercise Shoulder blade squeezes 10 x 5 sec      Lumbar Exercises: Stretches   Single Knee to Chest Stretch 20 seconds    Lower Trunk Rotation 3 reps;10 seconds    Piriformis Stretch 20 seconds      Lumbar Exercises: Supine   Pelvic Tilt 5 reps;5 seconds                  PT Education - 06/26/20 1102    Education Details Exam findings, POC, HEP    Person(s) Educated Patient    Methods Explanation;Demonstration;Tactile cues;Verbal cues;Handout    Comprehension Verbalized understanding;Returned demonstration;Verbal cues required;Tactile cues required;Need further instruction            PT Short Term Goals - 06/26/20 1109      PT SHORT TERM GOAL #1   Title Patient will be I with initial HEP to progress with PT    Time 4    Period Weeks    Status New    Target Date 07/24/20      PT SHORT TERM GOAL #2   Title Patient will have [place] reviewed and express understanding by 3rd visit    Time 3    Period Weeks    Status New    Target Date 07/17/20      PT SHORT TERM GOAL #3   Title Patient will demonstrate improved cervical rotation to left >/= 50  deg to improve driving    Time 4    Period Weeks    Status New    Target Date 07/24/20      PT SHORT TERM GOAL #4   Title Patient will report pain level of cervical region </= 4/10 and lumbar region </= 6/10 in order to reduce functional limitation    Time 4    Period Weeks    Status New    Target Date 07/24/20             PT Long Term Goals - 06/26/20 1110      PT LONG TERM GOAL #1   Title Patient will be I with final HEP to maintain progress from PT    Time 8    Period Weeks    Status New    Target Date 08/21/20      PT LONG  TERM GOAL #2   Title Patient will exhibit proper posture and cervical motion WFL / equal bilaterally in order to perform household tasks and reading without limitation    Time 8    Period Weeks    Status New    Target Date 08/21/20      PT LONG TERM GOAL #3   Title Patient will demonstrate improve core and periscapular strength >/= 4/5 MMT to improve walking tolerance >/= 30 minutes    Time 8    Period Weeks    Status New    Target Date 08/21/20      PT LONG TERM GOAL #4   Title Patient will exhibit a >/= 25% increase in lumbar motion of all planes to improve bending and lifting ability    Time 8    Period Weeks    Status New    Target Date 08/21/20      PT LONG TERM GOAL #5   Title Patient will report improved functional status of >/= 60% on FOTO    Time 8    Period Weeks    Status New    Target Date 08/21/20                  Plan - 06/26/20 1111    Clinical Impression Statement Patient presents to PT with report of chronic neck and back pain that have been worsening over the past few weeks. Her neck pain seems muscular in nature and related to right upper trap/levator scap. She did not exhibit any radicular symptoms, she does exhibit poor postural control with periscapular weekness and tenderness over that area. Her lumbar pain also seems to have a muscular component but she did report symptoms consistent with facet related  pain. She exhibits core strength deficit with poor lumbopelvic control and muscular tightness with all lumbar motion. She was provided exercises to initiate light stretching and core/postural activation. She would benefit from continued skilled PT to progress her mobility and strength in order to reduce pain and maximize functional level.    Personal Factors and Comorbidities Past/Current Experience;Time since onset of injury/illness/exacerbation;Comorbidity 3+;Fitness    Comorbidities DM II, HTN, Fibromyalgia    Examination-Activity Limitations Locomotion Level;Sit;Stand;Lift;Bend;Carry    Examination-Participation Restrictions Cleaning;Meal Prep;Community Activity;Shop    Stability/Clinical Decision Making Evolving/Moderate complexity    Clinical Decision Making Moderate    Rehab Potential Good    PT Frequency 1x / week    PT Duration 8 weeks    PT Treatment/Interventions ADLs/Self Care Home Management;Cryotherapy;Electrical Stimulation;Moist Heat;Traction;Neuromuscular re-education;Balance training;Therapeutic exercise;Therapeutic activities;Functional mobility training;Stair training;Gait training;Patient/family education;Manual techniques;Aquatic Therapy;Passive range of motion;Dry needling;Joint Manipulations;Spinal Manipulations;Taping    PT Next Visit Plan Review HEP and progress PRN, review FOTO by 3rd visit, manual / dry needling for right upper trap region, continue light cervical and lumbar stretching, progress core and postural strengthening (chin tucks)    PT Home Exercise Plan [id number]: SKTC stretch, piriformis stretch, LTR, pelvic tilt, shoulder blade squeezes    Consulted and Agree with Plan of Care Patient           Patient will benefit from skilled therapeutic intervention in order to improve the following deficits and impairments:  Decreased range of motion, Difficulty walking, Pain, Decreased activity tolerance, Postural dysfunction, Decreased strength, Impaired flexibility,  Improper body mechanics  Visit Diagnosis: Cervicalgia - Plan: PT plan of care cert/re-cert  Chronic bilateral low back pain, unspecified whether sciatica present - Plan: PT plan of care cert/re-cert  Muscle weakness (generalized) -  Plan: PT plan of care cert/re-cert  Other abnormalities of gait and mobility - Plan: PT plan of care cert/re-cert     Problem List Patient Active Problem List   Diagnosis Date Noted  . Type 2 diabetes mellitus with hyperglycemia, with long-term current use of insulin ([place]) 05/31/2020  . Fibromyalgia 05/31/2020  . Mouth ulcers 05/25/2020  . Elevated glucose 12/23/2019  . Diabetic ketoacidosis without coma associated with type 2 diabetes mellitus ([place]) 12/23/2019  . De Quervain's tenosynovitis, left 11/16/2018  . Bilateral carpal tunnel syndrome 04/13/2018  . Leukocytosis 10/04/2017  . Hyperlipidemia 08/04/2017  . Ulcerative colitis with complication ([place]) 16/06/9603  . Plantar fasciitis 05/28/2017    Hilda Blades, PT, DPT, LAT, ATC 06/26/20  2:39 PM Phone: [phone] Fax: [place] [facility] [address] [place], [place], [address] Phone: [phone]   Fax:  [phone]  Name: Aylana Hirschfeld MRN: [id number] Date of Birth: [birth year]

## 2020-06-26 NOTE — Patient Instructions (Signed)
Access Code: [email or web address] URL: [email or web address] Date: 06/26/2020 Prepared by: Hilda Blades  Exercises Hooklying Single Knee to Chest Stretch - 2 x daily - 7 x weekly - 2 reps - 20 seconds hold Supine Piriformis Stretch with Foot on Ground - 2 x daily - 7 x weekly - 2 reps - 20 seconds hold Supine Lower Trunk Rotation - 2 x daily - 7 x weekly - 5 reps - 10 seconds hold Supine Pelvic Tilt - 2 x daily - 7 x weekly - 5 reps - 5 seconds hold Seated Scapular Retraction - 2 x daily - 7 x weekly - 10 reps - 5 seconds hold

## 2020-06-28 ENCOUNTER — Encounter: Payer: Self-pay | Admitting: Internal Medicine

## 2020-06-28 ENCOUNTER — Ambulatory Visit (INDEPENDENT_AMBULATORY_CARE_PROVIDER_SITE_OTHER): Payer: Medicare Other | Admitting: Internal Medicine

## 2020-06-28 ENCOUNTER — Ambulatory Visit: Payer: Medicare Other | Admitting: Internal Medicine

## 2020-06-28 ENCOUNTER — Other Ambulatory Visit: Payer: BC Managed Care – PPO

## 2020-06-28 VITALS — BP 126/82 | HR 74 | Ht 62.0 in | Wt 159.2 lb

## 2020-06-28 DIAGNOSIS — R1013 Epigastric pain: Secondary | ICD-10-CM | POA: Diagnosis not present

## 2020-06-28 DIAGNOSIS — K121 Other forms of stomatitis: Secondary | ICD-10-CM

## 2020-06-28 DIAGNOSIS — K51819 Other ulcerative colitis with unspecified complications: Secondary | ICD-10-CM

## 2020-06-28 DIAGNOSIS — R110 Nausea: Secondary | ICD-10-CM | POA: Diagnosis not present

## 2020-06-28 MED ORDER — ESOMEPRAZOLE MAGNESIUM 40 MG PO CPDR: 40.0000 mg | DELAYED_RELEASE_CAPSULE | Freq: Every day | ORAL | 1 refills | Status: DC

## 2020-06-28 NOTE — Progress Notes (Signed)
Beverly Ramirez 59 y.o. [birth year] [id number]  Assessment & Plan:    Encounter Diagnoses  Name Primary?  . Other ulcerative colitis with complication ([place]) Yes  . Dyspepsia   . Nausea without vomiting   . Mouth ulcers     She is clearly improved as she is not having diarrhea on mesalamine 2.4 g twice daily.  She was in endoscopic remission with that in 2020.  Weyman Rodney had been stopped in [place] due to nausea (seems like a chronic recurrent issue) and also abnormal LFTs which is not typically an issue with that medication.  She when went on Humira and did very well but when she moved here she had an insurance change and could not afford it.  That is when I put her on mesalamine and she went into remission.   Her nausea and dyspepsia may or may not be related to her colitis that explained their not typical symptoms of that.  Mouth ulcers the same could be related but do not have to be.  She has a topical treatment for that that does help some.  My plans are:  Repeat fecal calprotectin if that is in the normal range will assume her colitis to be under control  Generic Nexium 40 mg daily for 1 to 2 months  ENT referral for mouth ulcers, I have some pictures of those in epic but have advised her to try to take pictures when they return just so she has an example of them in case they are not flaring when she has her appointment  If calprotectin is still elevated and she feels unwell question will be how much has had fallen and should be give the mesalamine some more time versus trying to use a biologic again if able to afford it.  Would retry Humira I think.  We will need to consider whether or not to check for antibodies to Humira first.  I have advised to use the dicyclomine for her stomach pain as well she thought that was just for diarrhea.  Follow-up will be arranged once I see the calprotectin result and contact her.    Subjective:   Chief Complaint: Nausea and  abdominal pain follow-up ulcerative colitis  HPI The patient is a 59 year old white woman with ulcerative colitis with a patchy distribution diagnosed in [place] years ago previously treated with Entyvio and Humira but lately has been mesalamine, she stopped that this year we restarted it in August due to symptoms including diarrhea abdominal pain and nausea and high fecal calprotectin.  She is also had recurrent mouth ulcers which may or may not be related to her UC.  She had seen me in August and I got her back on mesalamine she presented with persistent worsening or stable symptoms that were just not improving in September and saw Alonza Bogus and had a month or 4-week treatment with prednisone which helped.  She is having 2 formed stools a day.  She is nauseous and has some epigastric discomfort.  She has equated the symptoms with her colitis in the past.  She is not taking any Lomotil right now and not using dicyclomine either.  She has not tried dicyclomine for the stomach discomfort.  Mouth ulcers have abated right now though they tend to come and go still.  Her primary care provider has left the practice and there was going to be an ENT referral but that has not yet been done. Allergies  Allergen Reactions  . Codeine  Itching, Rash and Hives  . Latex Hives    With the power   Current Meds  Medication Sig  . albuterol (VENTOLIN HFA) 108 (90 Base) MCG/ACT inhaler Inhale 2 puffs into the lungs every 6 (six) hours as needed for wheezing or shortness of breath.  Marland Kitchen amLODipine (NORVASC) 5 MG tablet TAKE 1 TABLET BY MOUTH EVERY DAY  . atorvastatin (LIPITOR) 40 MG tablet Take 1 tablet (40 mg total) by mouth daily.  Marland Kitchen dicyclomine (BENTYL) 20 MG tablet Take 1 tablet (20 mg total) by mouth 3 (three) times daily as needed for spasms (abdominal cramping).  . diphenoxylate-atropine (LOMOTIL) 2.5-0.025 MG tablet Take 1 tablet by mouth 4 (four) times daily as needed for diarrhea or loose stools.  .  DULoxetine (CYMBALTA) 60 MG capsule TAKE 1 CAPSULE BY MOUTH EVERY DAY  . fluticasone (FLONASE) 50 MCG/ACT nasal spray SPRAY 2 SPRAYS INTO EACH NOSTRIL EVERY DAY (Patient taking differently: Patient takes as needed)  . glucose blood (ONETOUCH VERIO) test strip Check blood glucose 3-4 x day. Dx E11.9, E11.649, z79.4,  . insulin aspart (NOVOLOG FLEXPEN) 100 UNIT/ML FlexPen Inject 20 Units into the skin 3 (three) times daily with meals.  . Insulin Glargine (BASAGLAR KWIKPEN) 100 UNIT/ML Inject 30 Units into the skin at bedtime.  . Insulin Pen Needle (PEN NEEDLES) 32G X 6 MM MISC Use to inject insulin four times a day. Dx E11.9, z79.4  . Lancets (ONETOUCH DELICA PLUS [id number]) MISC 1 each by Does not apply route 3 (three) times daily. Dx E11.65, z79.4  . lidocaine (XYLOCAINE) 2 % solution Use as directed 15 mLs in the mouth or throat as needed for mouth pain.  . mesalamine (LIALDA) 1.2 g EC tablet Take 2 tablets (2.4 g total) by mouth 2 (two) times daily.  . methocarbamol (ROBAXIN) 500 MG tablet Take 1 tablet (500 mg total) by mouth every 8 (eight) hours as needed for muscle spasms.  . montelukast (SINGULAIR) 10 MG tablet TAKE 1 TABLET BY MOUTH EVERYDAY AT BEDTIME  . Multiple Vitamins-Minerals (WOMENS 50+ MULTI VITAMIN/MIN PO) Take by mouth.  . NONFORMULARY OR COMPOUNDED ITEM Swish and spit 5 mLs as needed (ulcer flare up in mouth). Magic mouthwash with lidocaine disp#150 ml  . ondansetron (ZOFRAN ODT) 4 MG disintegrating tablet Take 1 tablet (4 mg total) by mouth every 8 (eight) hours as needed for nausea or vomiting.  . triamcinolone cream (KENALOG) 0.1 % Apply 1 application topically 2 (two) times daily.   Past Medical History:  Diagnosis Date  . Anemia   . Carpal tunnel syndrome    bilateral  . Chronic heel pain   . Diabetes mellitus without complication ([place])   . DJD (degenerative joint disease) of cervical spine    MRI 2017  . Hyperlipidemia   . Plantar fasciitis    bilateral  . Sickle  cell anemia ([facility])    sickle cell trait  . Sleep apnea   . Tendonitis    left hand/wrist  . UC (ulcerative colitis) ([place])    Past Surgical History:  Procedure Laterality Date  . ABDOMINAL HYSTERECTOMY    . APPENDECTOMY    . CESAREAN SECTION     x4  . COLONOSCOPY     Multiple in [place]  . ESOPHAGOGASTRODUODENOSCOPY     Social History   Social History Narrative   She is married, 3 sons born 1979, 1981, 1984.  One daughter born in 27.   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   Caffeinated beverage every other day x1   07/13/2017   family history includes Colon cancer (age of onset: 66) in her cousin; Diabetes in her maternal aunt and mother; Hypertension in her mother; Lung cancer in her maternal aunt, maternal aunt, maternal aunt, maternal grandmother, and mother; Other in her sister; Sickle cell anemia in her sister; Sickle cell anemia (age of onset: 25) in her sister; Sickle cell trait in her father and mother; Stroke in her mother.   Review of Systems As per HPI  Objective:   Physical Exam BP 126/82   Pulse 74   Ht 5' 2"  (1.575 m)   Wt 159 lb 4 oz (72.2 kg)   BMI 29.13 kg/m  Well-developed well-nourished no acute distress Abdomen is mildly tender in the epigastrium and the right lower quadrant otherwise soft nontender without organomegaly or mass

## 2020-06-28 NOTE — Patient Instructions (Addendum)
Your provider has requested that you go to the basement level for lab work before leaving today. Press "B" on the elevator. The lab is located at the first door on the left as you exit the elevator.  Due to recent changes in healthcare laws, you may see the results of your imaging and laboratory studies on MyChart before your provider has had a chance to review them.  We understand that in some cases there may be results that are confusing or concerning to you. Not all laboratory results come back in the same time frame and the provider may be waiting for multiple results in order to interpret others.  Please give [place] 48 hours in order for your provider to thoroughly review all the results before contacting the office for clarification of your results.   We have sent the following medications to your pharmacy for you to pick up at your convenience: Esomprazole, take this for 1-2 months  Make sure and take your dicyclomine for your abdominal pain.  We have made you an appointment with [facility] for 07/05/2020 at 9:00AM, arrive at 8:45AM with photo ID, insurance cards. They are located at : [address], [place] . Phone # [phone]. You will be seeing Jolene Provost P.A.     I appreciate the opportunity to care for you. Silvano Rusk, MD, [facility]

## 2020-07-06 ENCOUNTER — Ambulatory Visit: Payer: Medicare Other | Admitting: Physical Therapy

## 2020-07-09 ENCOUNTER — Ambulatory Visit: Payer: Medicare Other | Attending: Family Medicine | Admitting: Physical Therapy

## 2020-07-09 ENCOUNTER — Other Ambulatory Visit: Payer: BC Managed Care – PPO

## 2020-07-09 ENCOUNTER — Encounter: Payer: Self-pay | Admitting: Physical Therapy

## 2020-07-09 ENCOUNTER — Other Ambulatory Visit: Payer: Self-pay

## 2020-07-09 DIAGNOSIS — M545 Low back pain, unspecified: Secondary | ICD-10-CM | POA: Insufficient documentation

## 2020-07-09 DIAGNOSIS — R2689 Other abnormalities of gait and mobility: Secondary | ICD-10-CM | POA: Diagnosis not present

## 2020-07-09 DIAGNOSIS — G8929 Other chronic pain: Secondary | ICD-10-CM | POA: Diagnosis not present

## 2020-07-09 DIAGNOSIS — M6281 Muscle weakness (generalized): Secondary | ICD-10-CM | POA: Insufficient documentation

## 2020-07-09 DIAGNOSIS — K51819 Other ulcerative colitis with unspecified complications: Secondary | ICD-10-CM

## 2020-07-09 DIAGNOSIS — M542 Cervicalgia: Secondary | ICD-10-CM | POA: Diagnosis not present

## 2020-07-09 NOTE — Patient Instructions (Signed)
Access Code: [email or web address] URL: [email or web address] Date: 07/09/2020 Prepared by: Hilda Blades  Exercises Hooklying Single Knee to Chest Stretch - 2 x daily - 7 x weekly - 2 reps - 20 seconds hold Supine Piriformis Stretch with Foot on Ground - 2 x daily - 7 x weekly - 2 reps - 20 seconds hold Supine Lower Trunk Rotation - 2 x daily - 7 x weekly - 5 reps - 10 seconds hold Supine Pelvic Tilt - 2 x daily - 7 x weekly - 5 reps - 5 seconds hold Hooklying Clamshell with Resistance - 2 x daily - 7 x weekly - 10 reps - 3 seconds hold Supine March with Resistance Band - 1 x daily - 7 x weekly - 10 reps - 3 seconds hold Seated Scapular Retraction - 2 x daily - 7 x weekly - 10 reps - 5 seconds hold Seated Cervical Retraction - 2 x daily - 7 x weekly - 10 reps - 5 seconds hold Standing Paraspinals Mobilization with Small Ball on Wall Standing Upper Trapezius Mobilization with Small Diona Foley

## 2020-07-09 NOTE — Therapy (Signed)
[place], [place], [address] Phone: [phone]   Fax:  [phone]  Physical Therapy Treatment  Patient Details  Name: Beverly Ramirez MRN: [id number] Date of Birth: 02-27-1961 Referring Provider (PT): Rutherford Guys, MD (PT order sent to Maximiano Coss NP)   Encounter Date: 07/09/2020   PT End of Session - 07/09/20 1338    Visit Number 2    Number of Visits 8    Date for PT Re-Evaluation 08/21/20    Authorization Type MCR    Authorization Time Period FOTO by 6th, KX by 15th    Progress Note Due on Visit 10    PT Start Time 1345    PT Stop Time 1430    PT Time Calculation (min) 45 min    Activity Tolerance Patient tolerated treatment well    Behavior During Therapy [facility] for tasks assessed/performed           Past Medical History:  Diagnosis Date  . Anemia   . Carpal tunnel syndrome    bilateral  . Chronic heel pain   . Diabetes mellitus without complication ([place])   . DJD (degenerative joint disease) of cervical spine    MRI 2017  . Hyperlipidemia   . Plantar fasciitis    bilateral  . Sickle cell anemia (H[facility])    sickle cell trait  . Sleep apnea   . Tendonitis    left hand/wrist  . UC (ulcerative colitis) ([place])     Past Surgical History:  Procedure Laterality Date  . ABDOMINAL HYSTERECTOMY    . APPENDECTOMY    . CESAREAN SECTION     x4  . COLONOSCOPY     Multiple in [place]  . ESOPHAGOGASTRODUODENOSCOPY      There were no vitals filed for this visit.   Subjective Assessment - 07/09/20 1342    Subjective Patient reports she had to cancel her last appointment due to her husband having to go to the doctors office. She is having more left sided lower back and neck pain today from having to help her husband recently.    Patient Stated Goals Get rid of pain and move better    Currently in Pain? Yes    Pain Score 7     Pain Location Neck    Pain Orientation Left    Pain  Descriptors / Indicators Tightness;Cramping    Pain Type Chronic pain    Pain Onset More than a month ago    Pain Frequency Constant    Pain Score 8    Pain Location Back    Pain Orientation Left;Lower    Pain Descriptors / Indicators Aching;Sore    Pain Type Chronic pain    Pain Onset More than a month ago    Pain Frequency Constant              [facility] PT Assessment - 07/09/20 0001      Posture/Postural Control   Posture Comments Patient exhibits rounded shoulder and forward head posture      AROM   Lumbar Flexion 75% - fingertips mid shin                         [facility] Adult PT Treatment/Exercise - 07/09/20 0001      Exercises   Exercises Neck;Lumbar      Neck Exercises: Seated   Neck Retraction 10 reps;3 secs    Neck Retraction Limitations VC for  proper technique    Other Seated Exercise Shoulder blade squeezes 10 x 5 sec      Lumbar Exercises: Stretches   Passive Hamstring Stretch 30 seconds    Passive Hamstring Stretch Limitations PROM supine    Single Knee to Chest Stretch 30 seconds    Single Knee to Chest Stretch Limitations PROM supine    Lower Trunk Rotation 3 reps;10 seconds    Lower Trunk Rotation Limitations figure-4 position    Piriformis Stretch 2 reps;30 seconds    Piriformis Stretch Limitations PROM supine    Other Lumbar Stretch Exercise Instruction and demonstration for SMFR using tennis ball for lumbar paraspinals and gluteal region      Lumbar Exercises: Aerobic   Nustep L5 x 5 min with UE and LE      Lumbar Exercises: Supine   Pelvic Tilt 10 reps;5 seconds    Pelvic Tilt Limitations consistent VC for proper technique and muscle engagement    Clam 10 reps;3 seconds    Clam Limitations red band    Bent Knee Raise 10 reps    Bent Knee Raise Limitations red band    Bridge 5 reps;3 seconds   2 sets   Bridge Limitations consistent VC for PPT and engaging glutes      Manual Therapy   Manual Therapy Manual Traction    Manual  Traction Right LE LAD and bilateral lumbar traction in hooklying      Neck Exercises: Stretches   Other Neck Stretches Instruction and demonstration of SMFR using tennis ball or theracane for upper trap/periscapular muscles                  PT Education - 07/09/20 1338    Education Details FOTO, HEP update, SMFR using ball or theracane    Person(s) Educated Patient    Methods Explanation;Demonstration;Verbal cues;Tactile cues;Handout    Comprehension Verbalized understanding;Returned demonstration;Verbal cues required;Need further instruction;Tactile cues required            PT Short Term Goals - 06/26/20 1109      PT SHORT TERM GOAL #1   Title Patient will be I with initial HEP to progress with PT    Time 4    Period Weeks    Status New    Target Date 07/24/20      PT SHORT TERM GOAL #2   Title Patient will have [place] reviewed and express understanding by 3rd visit    Time 3    Period Weeks    Status New    Target Date 07/17/20      PT SHORT TERM GOAL #3   Title Patient will demonstrate improved cervical rotation to left >/= 50 deg to improve driving    Time 4    Period Weeks    Status New    Target Date 07/24/20      PT SHORT TERM GOAL #4   Title Patient will report pain level of cervical region </= 4/10 and lumbar region </= 6/10 in order to reduce functional limitation    Time 4    Period Weeks    Status New    Target Date 07/24/20             PT Long Term Goals - 06/26/20 1110      PT LONG TERM GOAL #1   Title Patient will be I with final HEP to maintain progress from PT    Time 8    Period Weeks  Status New    Target Date 08/21/20      PT LONG TERM GOAL #2   Title Patient will exhibit proper posture and cervical motion WFL / equal bilaterally in order to perform household tasks and reading without limitation    Time 8    Period Weeks    Status New    Target Date 08/21/20      PT LONG TERM GOAL #3   Title Patient will demonstrate  improve core and periscapular strength >/= 4/5 MMT to improve walking tolerance >/= 30 minutes    Time 8    Period Weeks    Status New    Target Date 08/21/20      PT LONG TERM GOAL #4   Title Patient will exhibit a >/= 25% increase in lumbar motion of all planes to improve bending and lifting ability    Time 8    Period Weeks    Status New    Target Date 08/21/20      PT LONG TERM GOAL #5   Title Patient will report improved functional status of >/= 60% on FOTO    Time 8    Period Weeks    Status New    Target Date 08/21/20                 Plan - 07/09/20 1339    Clinical Impression Statement Patient tolerated therapy well with no adverse effects. She reported increased left lower back and neck symptoms today. Progress core/hip and postural strengthening with good tolerance and instructed patient on SMFR at home to reduce muscular tightness and pain. She would benefit from continued skilled PT to progress her mobility and strength in order to reduce pain and maximize functional level.    PT Treatment/Interventions ADLs/Self Care Home Management;Cryotherapy;Electrical Stimulation;Moist Heat;Traction;Neuromuscular re-education;Balance training;Therapeutic exercise;Therapeutic activities;Functional mobility training;Stair training;Gait training;Patient/family education;Manual techniques;Aquatic Therapy;Passive range of motion;Dry needling;Joint Manipulations;Spinal Manipulations;Taping    PT Next Visit Plan Review HEP and progress PRN, manual / dry needling for right upper trap region, continue light cervical and lumbar stretching, progress core and postural strengthening (chin tucks)    PT Home Exercise Plan [id number]: SKTC stretch, piriformis stretch, LTR, pelvic tilt, supine clam with red, march with red, shoulder blade squeezes, chin tuck    Consulted and Agree with Plan of Care Patient           Patient will benefit from skilled therapeutic intervention in order to improve  the following deficits and impairments:  Decreased range of motion, Difficulty walking, Pain, Decreased activity tolerance, Postural dysfunction, Decreased strength, Impaired flexibility, Improper body mechanics  Visit Diagnosis: Cervicalgia  Chronic bilateral low back pain, unspecified whether sciatica present  Muscle weakness (generalized)  Other abnormalities of gait and mobility     Problem List Patient Active Problem List   Diagnosis Date Noted  . Type 2 diabetes mellitus with hyperglycemia, with long-term current use of insulin ([place]) 05/31/2020  . Fibromyalgia 05/31/2020  . Mouth ulcers 05/25/2020  . Elevated glucose 12/23/2019  . Diabetic ketoacidosis without coma associated with type 2 diabetes mellitus ([place]) 12/23/2019  . De Quervain's tenosynovitis, left 11/16/2018  . Bilateral carpal tunnel syndrome 04/13/2018  . Leukocytosis 10/04/2017  . Hyperlipidemia 08/04/2017  . Ulcerative colitis with complication ([place]) 22/48/2500  . Plantar fasciitis 05/28/2017    Hilda Blades, PT, DPT, LAT, ATC 07/09/20  2:46 PM Phone: [phone] Fax: [facility] [address] [place], [place], [address]  Phone: [phone]   Fax:  [phone]  Name: Beverly Ramirez MRN: [id number] Date of Birth: 11-21-1960

## 2020-07-11 LAB — CALPROTECTIN, FECAL: Calprotectin, Fecal: 137 ug/g — ABNORMAL HIGH (ref 0–120)

## 2020-07-13 DIAGNOSIS — Z23 Encounter for immunization: Secondary | ICD-10-CM | POA: Diagnosis not present

## 2020-07-16 ENCOUNTER — Encounter: Payer: Self-pay | Admitting: Physical Therapy

## 2020-07-16 ENCOUNTER — Ambulatory Visit: Payer: Medicare Other | Admitting: Physical Therapy

## 2020-07-16 ENCOUNTER — Other Ambulatory Visit: Payer: Self-pay

## 2020-07-16 DIAGNOSIS — M542 Cervicalgia: Secondary | ICD-10-CM

## 2020-07-16 DIAGNOSIS — R2689 Other abnormalities of gait and mobility: Secondary | ICD-10-CM

## 2020-07-16 DIAGNOSIS — M545 Low back pain, unspecified: Secondary | ICD-10-CM

## 2020-07-16 DIAGNOSIS — G8929 Other chronic pain: Secondary | ICD-10-CM

## 2020-07-16 DIAGNOSIS — M6281 Muscle weakness (generalized): Secondary | ICD-10-CM

## 2020-07-16 NOTE — Therapy (Signed)
[place], [place], [address] Phone: [phone]   Fax:  [phone]  Physical Therapy Treatment  Patient Details  Name: Beverly Ramirez MRN: [id number] Date of Birth: 06-18-1961 Referring Provider (PT): Rutherford Guys, MD (PT order sent to Maximiano Coss NP)   Encounter Date: 07/16/2020   PT End of Session - 07/16/20 1225    Visit Number 3    Number of Visits 8    Date for PT Re-Evaluation 08/21/20    Authorization Type MCR    Authorization Time Period FOTO by 6th, KX by 15th    Progress Note Due on Visit 10    PT Start Time 1215    PT Stop Time 1255    PT Time Calculation (min) 40 min    Activity Tolerance Patient tolerated treatment well    Behavior During Therapy [facility] for tasks assessed/performed           Past Medical History:  Diagnosis Date  . Anemia   . Carpal tunnel syndrome    bilateral  . Chronic heel pain   . Diabetes mellitus without complication ([place])   . DJD (degenerative joint disease) of cervical spine    MRI 2017  . Hyperlipidemia   . Plantar fasciitis    bilateral  . Sickle cell anemia (HCC)    sickle cell trait  . Sleep apnea   . Tendonitis    left hand/wrist  . UC (ulcerative colitis) ([place])     Past Surgical History:  Procedure Laterality Date  . ABDOMINAL HYSTERECTOMY    . APPENDECTOMY    . CESAREAN SECTION     x4  . COLONOSCOPY     Multiple in [place]  . ESOPHAGOGASTRODUODENOSCOPY      There were no vitals filed for this visit.   Subjective Assessment - 07/16/20 1222    Subjective Patient reports she didn't feel good all weekend because she had her second vaccine.    Patient Stated Goals Get rid of pain and move better    Currently in Pain? Yes    Pain Score 4     Pain Location Neck    Pain Orientation Left    Pain Descriptors / Indicators Tightness;Aching    Pain Type Chronic pain    Pain Onset More than a month ago    Pain Frequency  Constant    Pain Score 5    Pain Location Back    Pain Orientation Left;Lower    Pain Descriptors / Indicators Aching;Sore    Pain Type Chronic pain    Pain Onset More than a month ago    Pain Frequency Constant              [facility] PT Assessment - 07/16/20 0001      AROM   Lumbar Flexion 75% - fingertips mid shin    Lumbar Extension 75%    Lumbar - Right Side Bend 75%    Lumbar - Left Side Bend 75%    Lumbar - Right Rotation 75%    Lumbar - Left Rotation 75%                         [facility] Adult PT Treatment/Exercise - 07/16/20 0001      Exercises   Exercises Neck;Lumbar      Neck Exercises: Theraband   Rows 15 reps;Blue   2 sets     Neck Exercises: Seated   Neck  Retraction 5 reps;3 secs      Lumbar Exercises: Stretches   Lower Trunk Rotation 3 reps;10 seconds    Lower Trunk Rotation Limitations figure-4 position    Piriformis Stretch 2 reps;30 seconds    Piriformis Stretch Limitations supine figure-4    Other Lumbar Stretch Exercise Seated lumbar flexion with physioball 5 x 5 sec fwd and lateral each      Lumbar Exercises: Aerobic   Nustep L5 x 5 min with UE and LE      Lumbar Exercises: Supine   Clam 10 reps;3 seconds    Clam Limitations red band    Bent Knee Raise 10 reps    Bent Knee Raise Limitations red band    Bridge with Ball Squeeze 5 reps;3 seconds   2 sets                 PT Education - 07/16/20 1224    Education Details HEP update    Person(s) Educated Patient    Methods Explanation;Demonstration;Verbal cues;Handout;Tactile cues    Comprehension Verbalized understanding;Returned demonstration;Verbal cues required;Need further instruction;Tactile cues required            PT Short Term Goals - 06/26/20 1109      PT SHORT TERM GOAL #1   Title Patient will be I with initial HEP to progress with PT    Time 4    Period Weeks    Status New    Target Date 07/24/20      PT SHORT TERM GOAL #2   Title Patient will have  [place] reviewed and express understanding by 3rd visit    Time 3    Period Weeks    Status New    Target Date 07/17/20      PT SHORT TERM GOAL #3   Title Patient will demonstrate improved cervical rotation to left >/= 50 deg to improve driving    Time 4    Period Weeks    Status New    Target Date 07/24/20      PT SHORT TERM GOAL #4   Title Patient will report pain level of cervical region </= 4/10 and lumbar region </= 6/10 in order to reduce functional limitation    Time 4    Period Weeks    Status New    Target Date 07/24/20             PT Long Term Goals - 06/26/20 1110      PT LONG TERM GOAL #1   Title Patient will be I with final HEP to maintain progress from PT    Time 8    Period Weeks    Status New    Target Date 08/21/20      PT LONG TERM GOAL #2   Title Patient will exhibit proper posture and cervical motion WFL / equal bilaterally in order to perform household tasks and reading without limitation    Time 8    Period Weeks    Status New    Target Date 08/21/20      PT LONG TERM GOAL #3   Title Patient will demonstrate improve core and periscapular strength >/= 4/5 MMT to improve walking tolerance >/= 30 minutes    Time 8    Period Weeks    Status New    Target Date 08/21/20      PT LONG TERM GOAL #4   Title Patient will exhibit a >/= 25% increase in lumbar motion of all  planes to improve bending and lifting ability    Time 8    Period Weeks    Status New    Target Date 08/21/20      PT LONG TERM GOAL #5   Title Patient will report improved functional status of >/= 60% on FOTO    Time 8    Period Weeks    Status New    Target Date 08/21/20                 Plan - 07/16/20 1225    Clinical Impression Statement Patient tolerated therapy well with no adverse effects. Therapy focused on progresssing core and postural strengthening with good tolerance. She reports improvement this visit compared to last visit and did not report any increase in  pain during exercise. Patient was also instructed on further lumbar mobility exercises she could perform with good tolerance, updated HEP this visit. She would benefit from continued skilled PT to progress her mobility and strength in order to reduce pain and maximize functional level.    PT Treatment/Interventions ADLs/Self Care Home Management;Cryotherapy;Electrical Stimulation;Moist Heat;Traction;Neuromuscular re-education;Balance training;Therapeutic exercise;Therapeutic activities;Functional mobility training;Stair training;Gait training;Patient/family education;Manual techniques;Aquatic Therapy;Passive range of motion;Dry needling;Joint Manipulations;Spinal Manipulations;Taping    PT Next Visit Plan Review HEP and progress PRN, manual / dry needling for right upper trap region, continue light cervical and lumbar stretching, progress core and postural strengthening (chin tucks)    PT Home Exercise Plan [id number]: piriformis stretch, seated lumbar stretch with physioball, LTR, bridge with ball squeeze, pelvic tilt, supine clam with red, march with red, chin tuck, row with blue, SMFR using ball    Consulted and Agree with Plan of Care Patient           Patient will benefit from skilled therapeutic intervention in order to improve the following deficits and impairments:  Decreased range of motion, Difficulty walking, Pain, Decreased activity tolerance, Postural dysfunction, Decreased strength, Impaired flexibility, Improper body mechanics  Visit Diagnosis: Cervicalgia  Chronic bilateral low back pain, unspecified whether sciatica present  Muscle weakness (generalized)  Other abnormalities of gait and mobility     Problem List Patient Active Problem List   Diagnosis Date Noted  . Type 2 diabetes mellitus with hyperglycemia, with long-term current use of insulin ([place]) 05/31/2020  . Fibromyalgia 05/31/2020  . Mouth ulcers 05/25/2020  . Elevated glucose 12/23/2019  . Diabetic ketoacidosis  without coma associated with type 2 diabetes mellitus ([place]) 12/23/2019  . De Quervain's tenosynovitis, left 11/16/2018  . Bilateral carpal tunnel syndrome 04/13/2018  . Leukocytosis 10/04/2017  . Hyperlipidemia 08/04/2017  . Ulcerative colitis with complication ([place]) 65/11/5463  . Plantar fasciitis 05/28/2017    Hilda Blades, PT, DPT, LAT, ATC 07/16/20  1:05 PM Phone: [phone] Fax: [facility] [address] [place], [place], [address] Phone: [phone]   Fax:  [phone]  Name: Beverly Ramirez MRN: [id number] Date of Birth: 02/10/1961

## 2020-07-16 NOTE — Patient Instructions (Signed)
Access Code: [email or web address] URL: [email or web address] Date: 07/16/2020 Prepared by: Hilda Blades  Exercises Supine Piriformis Stretch with Foot on Ground - 2 x daily - 7 x weekly - 2 reps - 20 seconds hold Supine Lower Trunk Rotation - 2 x daily - 7 x weekly - 5 reps - 10 seconds hold Seated Flexion Stretch with Swiss Ball - 2 x daily - 7 x weekly - 5 reps - 5 seconds hold Seated Thoracic Flexion and Rotation with Swiss Ball - 2 x daily - 7 x weekly - 5 reps - 5 seconds hold Supine Bridge with Mini Swiss Ball Between Knees - 1 x daily - 7 x weekly - 2 sets - 5 reps - 3 seconds hold Hooklying Clamshell with Resistance - 1 x daily - 7 x weekly - 10 reps - 3 seconds hold - 2 sets Supine March with Resistance Band - 1 x daily - 7 x weekly - 10 reps - 3 seconds hold - 2 sets Seated Cervical Retraction - 2 x daily - 7 x weekly - 10 reps - 5 seconds hold Standing Row with Anchored Resistance - 1 x daily - 7 x weekly - 2 sets - 15 reps Standing Paraspinals Mobilization with Small Ball on Wall Standing Upper Trapezius Mobilization with Small Diona Foley

## 2020-07-20 ENCOUNTER — Other Ambulatory Visit: Payer: Self-pay | Admitting: Internal Medicine

## 2020-07-23 ENCOUNTER — Encounter: Payer: Self-pay | Admitting: Internal Medicine

## 2020-07-23 ENCOUNTER — Ambulatory Visit (INDEPENDENT_AMBULATORY_CARE_PROVIDER_SITE_OTHER): Payer: Medicare Other | Admitting: Internal Medicine

## 2020-07-23 ENCOUNTER — Other Ambulatory Visit: Payer: Self-pay

## 2020-07-23 VITALS — BP 126/80 | HR 82 | Ht 62.0 in | Wt 162.2 lb

## 2020-07-23 DIAGNOSIS — E1165 Type 2 diabetes mellitus with hyperglycemia: Secondary | ICD-10-CM | POA: Diagnosis not present

## 2020-07-23 DIAGNOSIS — E785 Hyperlipidemia, unspecified: Secondary | ICD-10-CM | POA: Diagnosis not present

## 2020-07-23 DIAGNOSIS — Z794 Long term (current) use of insulin: Secondary | ICD-10-CM | POA: Diagnosis not present

## 2020-07-23 LAB — POCT GLYCOSYLATED HEMOGLOBIN (HGB A1C): Hemoglobin A1C: 7.2 % — AB (ref 4.0–5.6)

## 2020-07-23 MED ORDER — INSULIN PEN NEEDLE 32G X 4 MM MISC: 1.0000 | Freq: Four times a day (QID) | 11 refills | Status: DC

## 2020-07-23 MED ORDER — DAPAGLIFLOZIN PROPANEDIOL 5 MG PO TABS: 5.0000 mg | ORAL_TABLET | Freq: Every day | ORAL | 4 refills | Status: DC

## 2020-07-23 MED ORDER — BASAGLAR KWIKPEN 100 UNIT/ML ~~LOC~~ SOPN
32.0000 [IU] | PEN_INJECTOR | Freq: Every day | SUBCUTANEOUS | 6 refills | Status: DC
Start: 2020-07-23 — End: 2021-05-31

## 2020-07-23 MED ORDER — NOVOLOG FLEXPEN 100 UNIT/ML ~~LOC~~ SOPN: 22.0000 [IU] | PEN_INJECTOR | Freq: Three times a day (TID) | SUBCUTANEOUS | 11 refills | Status: DC

## 2020-07-23 NOTE — Progress Notes (Signed)
Name: Beverly Ramirez  MRN/ DOB: [id number], 03/25/1961   Age/ Sex: 59 y.o., female    PCP: Rutherford Guys, MD (Inactive)   Reason for Endocrinology Evaluation: Type 2 Diabetes Mellitus     Date of Initial Endocrinology Visit: 07/23/2020     PATIENT IDENTIFIER: Beverly Ramirez is a 59 y.o. female with a past medical history of T2DM, OSA, fibromyalgia and UC. The patient presented for initial endocrinology clinic visit on 07/23/2020 for consultative assistance with her diabetes management.    HPI: Beverly Ramirez was    Diagnosed with DM 2019 Prior Medications tried/Intolerance: Has been on intermittently on insulin while on prednisone - Metformin- too sick  Currently checking blood sugars 1 x / day  Hypoglycemia episodes : Yes - a while ago            Hemoglobin A1c has ranged from 7.1%in 2021 , peaking at 9.5% in 2020. Patient required assistance for hypoglycemia: no  Patient has required hospitalization within the last 1 year from hyper or hypoglycemia:  Yes- hyperglycemia   In terms of diet, the patient eats 3 meals a day, grazes through the ice   Has UC and would intermittently use prednisone    HOME DIABETES REGIMEN: Basaglar 35 units daily  Novolog 25 units with each meal     Statin: yes ACE-I/ARB: no Prior Diabetic Education: Yes   METER DOWNLOAD SUMMARY: Date range evaluated: 11/1-11/15/2021 Average Number Tests/Day = 0.7 Overall Mean FS Glucose = 145  BG Ranges: Low = 121 High = 162   Hypoglycemic Events/30 Days: BG < 50 = 0 Episodes of symptomatic severe hypoglycemia = 0   DIABETIC COMPLICATIONS: Microvascular complications:   Denies: CKD, retinopathy , neuropathy  Last eye exam: Completed  11/2019  Macrovascular complications:   Denies: CAD, PVD, CVA   PAST HISTORY: Past Medical History:  Past Medical History:  Diagnosis Date  . Anemia   . Carpal tunnel syndrome    bilateral  . Chronic heel pain   . Diabetes mellitus  without complication ([place])   . DJD (degenerative joint disease) of cervical spine    MRI 2017  . Hyperlipidemia   . Plantar fasciitis    bilateral  . Sickle cell anemia (HCC)    sickle cell trait  . Sleep apnea   . Tendonitis    left hand/wrist  . UC (ulcerative colitis) ([place])    Past Surgical History:  Past Surgical History:  Procedure Laterality Date  . ABDOMINAL HYSTERECTOMY    . APPENDECTOMY    . CESAREAN SECTION     x4  . COLONOSCOPY     Multiple in [place]  . ESOPHAGOGASTRODUODENOSCOPY      Social History:  reports that she quit smoking about 13 years ago. Her smoking use included cigarettes. She has never used smokeless tobacco. She reports current alcohol use. She reports that she does not use drugs. Family History:  Family History  Problem Relation Age of Onset  . Stroke Mother   . Diabetes Mother   . Hypertension Mother   . Lung cancer Mother   . Sickle cell trait Mother   . Diabetes Maternal Aunt   . Lung cancer Maternal Aunt   . Lung cancer Maternal Aunt   . Lung cancer Maternal Aunt   . Colon cancer Cousin 50  . Sickle cell trait Father   . Sickle cell anemia Sister 38  . Lung cancer Maternal Grandmother   . Sickle cell anemia Sister   .  Other Sister        accident and blood clot formed     HOME MEDICATIONS: Allergies as of 07/23/2020      Reactions   Codeine Itching, Rash, Hives   Latex Hives   With the power      Medication List       Accurate as of July 23, 2020  3:59 PM. If you have any questions, ask your nurse or doctor.        albuterol 108 (90 Base) MCG/ACT inhaler Commonly known as: VENTOLIN HFA Inhale 2 puffs into the lungs every 6 (six) hours as needed for wheezing or shortness of breath.   amLODipine 5 MG tablet Commonly known as: NORVASC TAKE 1 TABLET BY MOUTH EVERY DAY   atorvastatin 40 MG tablet Commonly known as: LIPITOR Take 1 tablet (40 mg total) by mouth daily.   Basaglar KwikPen 100 UNIT/ML Inject 30  Units into the skin at bedtime.   dapagliflozin propanediol 5 MG Tabs tablet Commonly known as: Farxiga Take 1 tablet (5 mg total) by mouth daily before breakfast. Started by: Dorita Sciara, MD   dicyclomine 20 MG tablet Commonly known as: BENTYL Take 1 tablet (20 mg total) by mouth 3 (three) times daily as needed for spasms (abdominal cramping).   diphenoxylate-atropine 2.5-0.025 MG tablet Commonly known as: LOMOTIL Take 1 tablet by mouth 4 (four) times daily as needed for diarrhea or loose stools.   DULoxetine 60 MG capsule Commonly known as: CYMBALTA TAKE 1 CAPSULE BY MOUTH EVERY DAY   esomeprazole 40 MG capsule Commonly known as: NEXIUM TAKE 1 CAPSULE BY MOUTH EVERY DAY BEFORE BREAKFAST   fluticasone 50 MCG/ACT nasal spray Commonly known as: FLONASE SPRAY 2 SPRAYS INTO EACH NOSTRIL EVERY DAY What changed: See the new instructions.   lidocaine 2 % solution Commonly known as: XYLOCAINE Use as directed 15 mLs in the mouth or throat as needed for mouth pain.   mesalamine 1.2 g EC tablet Commonly known as: LIALDA Take 2 tablets (2.4 g total) by mouth 2 (two) times daily.   methocarbamol 500 MG tablet Commonly known as: ROBAXIN Take 1 tablet (500 mg total) by mouth every 8 (eight) hours as needed for muscle spasms.   montelukast 10 MG tablet Commonly known as: SINGULAIR TAKE 1 TABLET BY MOUTH EVERYDAY AT BEDTIME   NONFORMULARY OR COMPOUNDED ITEM Swish and spit 5 mLs as needed (ulcer flare up in mouth). Magic mouthwash with lidocaine disp#150 ml   NovoLOG FlexPen 100 UNIT/ML FlexPen Generic drug: insulin aspart Inject 20 Units into the skin 3 (three) times daily with meals.   ondansetron 4 MG disintegrating tablet Commonly known as: Zofran ODT Take 1 tablet (4 mg total) by mouth every 8 (eight) hours as needed for nausea or vomiting.   OneTouch Delica Plus [id number] Misc 1 each by Does not apply route 3 (three) times daily. Dx E11.65, z79.4   OneTouch  Verio test strip Generic drug: glucose blood Check blood glucose 3-4 x day. Dx E11.9, E11.649, z79.4,   Pen Needles 32G X 6 MM Misc Use to inject insulin four times a day. Dx E11.9, z79.4   triamcinolone 0.1 % Commonly known as: KENALOG Apply 1 application topically 2 (two) times daily.   WOMENS 50+ MULTI VITAMIN/MIN PO Take by mouth.        ALLERGIES: Allergies  Allergen Reactions  . Codeine Itching, Rash and Hives  . Latex Hives    With the power  REVIEW OF SYSTEMS: A comprehensive ROS was conducted with the patient and is negative except as per HPI and below:  Review of Systems  Gastrointestinal: Negative for diarrhea and nausea.  Musculoskeletal: Positive for back pain.  Neurological: Positive for tingling.       OBJECTIVE:   VITAL SIGNS: BP 126/80   Pulse 82   Ht 5' 2"  (1.575 m)   Wt 162 lb 4 oz (73.6 kg)   SpO2 98%   BMI 29.68 kg/m    PHYSICAL EXAM:  General: Pt appears well and is in NAD  Neck: General: Supple without adenopathy or carotid bruits. Thyroid: Thyroid size normal.  No goiter or nodules appreciated.   Lungs: Clear with good BS bilat with no rales, rhonchi, or wheezes  Heart: RRR with normal S1 and S2 and no gallops; no murmurs; no rub  Abdomen: Normoactive bowel sounds, soft, nontender, without masses or organomegaly palpable  Extremities:  Lower extremities - No pretibial edema. No lesions.  Skin: Normal texture and temperature to palpation. No rash noted. No Acanthosis nigricans/skin tags. No lipohypertrophy.  Neuro: MS is good with appropriate affect, pt is alert and Ox3     DATA REVIEWED:  Lab Results  Component Value Date   HGBA1C 7.2 (A) 07/23/2020   HGBA1C 7.1 (H) 04/20/2020   HGBA1C 8.0 (H) 03/02/2020   Lab Results  Component Value Date   LDLCALC 179 (H) 02/02/2020   CREATININE 0.86 05/07/2020   Lab Results  Component Value Date   MICRALBCREAT <25 01/14/2019    Lab Results  Component Value Date   CHOL 250  (H) 02/02/2020   HDL 40 02/02/2020   LDLCALC 179 (H) 02/02/2020   TRIG 167 (H) 02/02/2020   CHOLHDL 6.3 (H) 02/02/2020        ASSESSMENT / PLAN / RECOMMENDATIONS:   1) Type 2 Diabetes Mellitus, Optimally controlled, Without complications - Most recent A1c of 7.2 %. Goal A1c < 7.0 %.     GENERAL: I have discussed with the patient the pathophysiology of diabetes. We went over the natural progression of the disease. We talked about both insulin resistance and insulin deficiency. We stressed the importance of lifestyle changes including diet and exercise. I explained the complications associated with diabetes including retinopathy, nephropathy, neuropathy as well as increased risk of cardiovascular disease. We went over the benefit seen with glycemic control.   I explained to the patient that diabetic patients are at higher than normal risk for amputations.  Discussed pharmacokinetics of basal/bolus insulin and the importance of taking prandial insulin with meals.  We also discussed avoiding sugar-sweetened beverages and snacks, when possible.  Pt has been doing better with snacks. She continues to meet with RD . Discussed adding an SGLT-2 inhibitor , cautioned against genital infections   MEDICATIONS: -Start Farxiga 5 mg, 1 tablet with breakfast  - Decrease Basaglar to 32 units daily  - Decrease Novolog to 22 units daily  - CF : Novolog ( BG -130/20)  EDUCATION / INSTRUCTIONS: BG monitoring instructions: Patient is instructed to check her blood sugars 3 times a day, before meals . Call [place] [facility] if: BG persistently < 70  I reviewed the Rule of 15 for the treatment of hypoglycemia in detail with the patient. Literature supplied.   2) Diabetic complications:  Eye: Does not  have known diabetic retinopathy.  Neuro/ Feet: Does not have known diabetic peripheral neuropathy. Renal: Patient does not  have known baseline CKD. She is not on an  ACEI/ARB at present.[place] Kitchen   3)  Dyslipidemia: Patient is atorvastatin 40 mg daily. Recently started this current dose. Discussed cardiovascular benefits         Signed electronically by: Mack Guise, MD  [facility]  [facility] [address] [place], [place] [address] Phone: [phone] FAX: [phone]   CC: Rutherford Guys, MD (Inactive) No address on file Phone: None  Fax: None    Return to Endocrinology clinic as below: Future Appointments  Date Time Provider [place]  07/24/2020  2:00 PM Bobette Mo, PT [facility]  08/24/2020 11:15 AM Clydell Hakim, RD [place] [facility]  10/26/2020  1:00 PM Lucette Kratz, Melanie Crazier, MD [facility] None

## 2020-07-23 NOTE — Patient Instructions (Addendum)
-   Start Farxiga 5 mg, 1 tablet with breakfast  - Decrease Basaglar to 32 units daily  - Decrease Novolog to 22 units daily  -Novolog correctional insulin: ADD extra units on insulin to your meal-time Novolog dose if your blood sugars are higher than 170. Use the scale below to help guide you:   Blood sugar before meal Number of units to inject  Less than 170 0 unit  171 -  190 2 units  191 -  210 3 units  211 -  230 4 units  231 -  250  5 units  251 -  270 6 units  271 -  290 7 units  291 -  310 8 units  311- 330 Call the physician     - Check out these insulin  Pumps:Omnipod and T- slim      HOW TO TREAT LOW BLOOD SUGARS (Blood sugar LESS THAN 70 MG/DL)  Please follow the RULE OF 15 for the treatment of hypoglycemia treatment (when your (blood sugars are less than 70 mg/dL)    STEP 1: Take 15 grams of carbohydrates when your blood sugar is low, which includes:   3-4 GLUCOSE TABS  OR  3-4 OZ OF JUICE OR REGULAR SODA OR  ONE TUBE OF GLUCOSE GEL     STEP 2: RECHECK blood sugar in 15 MINUTES STEP 3: If your blood sugar is still low at the 15 minute recheck --> then, go back to STEP 1 and treat AGAIN with another 15 grams of carbohydrates.

## 2020-07-24 ENCOUNTER — Other Ambulatory Visit: Payer: Self-pay

## 2020-07-24 ENCOUNTER — Encounter: Payer: Self-pay | Admitting: Physical Therapy

## 2020-07-24 ENCOUNTER — Ambulatory Visit: Payer: Medicare Other | Admitting: Physical Therapy

## 2020-07-24 DIAGNOSIS — R2689 Other abnormalities of gait and mobility: Secondary | ICD-10-CM

## 2020-07-24 DIAGNOSIS — M6281 Muscle weakness (generalized): Secondary | ICD-10-CM | POA: Diagnosis not present

## 2020-07-24 DIAGNOSIS — G8929 Other chronic pain: Secondary | ICD-10-CM

## 2020-07-24 DIAGNOSIS — M542 Cervicalgia: Secondary | ICD-10-CM

## 2020-07-24 DIAGNOSIS — M545 Low back pain, unspecified: Secondary | ICD-10-CM

## 2020-07-24 NOTE — Therapy (Addendum)
[place], [place], [address] Phone: [phone]   Fax:  [phone]  Physical Therapy Treatment / Discharge  Patient Details  Name: Beverly Ramirez MRN: [id number] Date of Birth: October 14, 1960 Referring Provider (PT): Rutherford Guys, MD (PT order sent to Maximiano Coss NP)   Encounter Date: 07/24/2020   PT End of Session - 07/24/20 1355    Visit Number 4    Number of Visits 8    Date for PT Re-Evaluation 08/21/20    Authorization Type MCR    Authorization Time Period FOTO by 6th, KX by 15th    Progress Note Due on Visit 10    PT Start Time 1300    PT Stop Time 1340    PT Time Calculation (min) 40 min    Activity Tolerance Patient tolerated treatment well    Behavior During Therapy [facility] for tasks assessed/performed           Past Medical History:  Diagnosis Date  . Anemia   . Carpal tunnel syndrome    bilateral  . Chronic heel pain   . Diabetes mellitus without complication ([place])   . DJD (degenerative joint disease) of cervical spine    MRI 2017  . Hyperlipidemia   . Plantar fasciitis    bilateral  . Sickle cell anemia (HCC)    sickle cell trait  . Sleep apnea   . Tendonitis    left hand/wrist  . UC (ulcerative colitis) ([place])     Past Surgical History:  Procedure Laterality Date  . ABDOMINAL HYSTERECTOMY    . APPENDECTOMY    . CESAREAN SECTION     x4  . COLONOSCOPY     Multiple in [place]  . ESOPHAGOGASTRODUODENOSCOPY      There were no vitals filed for this visit.   Subjective Assessment - 07/24/20 1401    Subjective Patient reports she is doing well, no new issues. She is feeling better and states that when she does the exercises in the morning she feels better during the day.    Patient Stated Goals Get rid of pain and move better    Currently in Pain? Yes    Pain Score 4     Pain Location Neck    Pain Orientation Left    Pain Descriptors / Indicators  Tightness;Aching    Pain Type Chronic pain    Pain Onset More than a month ago    Pain Frequency Constant    Pain Score 5    Pain Location Back    Pain Orientation Left;Lower    Pain Descriptors / Indicators Aching;Sore    Pain Type Chronic pain    Pain Onset More than a month ago    Pain Frequency Constant              [facility] PT Assessment - 07/24/20 0001      AROM   Cervical - Right Rotation 60    Cervical - Left Rotation 60                         [facility] Adult PT Treatment/Exercise - 07/24/20 0001      Exercises   Exercises Neck;Lumbar      Neck Exercises: Theraband   Rows 15 reps;Blue   2 sets   Horizontal ABduction 15 reps   2 sets   Horizontal ABduction Limitations yellow      Neck Exercises: Supine  Neck Retraction 5 reps;5 secs   2 sets     Lumbar Exercises: Stretches   Other Lumbar Stretch Exercise Seated lumbar flexion with physioball 5 x 5 sec fwd and lateral each      Lumbar Exercises: Aerobic   Nustep L5 x 5 min with UE and LE      Lumbar Exercises: Standing   Lifting 10 reps   2 sets   Lifting Weights (lbs) 15    Lifting Limitations Deadlift from 8" box      Lumbar Exercises: Seated   Sit to Stand 10 reps   2 sets   Sit to Stand Limitations focus on proper hip hinge technique      Lumbar Exercises: Supine   Bridge with Ball Squeeze 10 reps;3 seconds   2 sets   Other Supine Lumbar Exercises 90-90 alternating heel taps 3 x 10                  PT Education - 07/24/20 1355    Education Details HEP    Person(s) Educated Patient    Methods Explanation    Comprehension Verbalized understanding;Need further instruction            PT Short Term Goals - 07/24/20 1357      PT SHORT TERM GOAL #1   Title Patient will be I with initial HEP to progress with PT    Time 4    Period Weeks    Status On-going    Target Date 07/24/20      PT SHORT TERM GOAL #2   Title Patient will have FOTO reviewed and express understanding  by 3rd visit    Time 3    Period Weeks    Status Achieved    Target Date 07/17/20      PT SHORT TERM GOAL #3   Title Patient will demonstrate improved cervical rotation to left >/= 50 deg to improve driving    Time 4    Period Weeks    Status Achieved    Target Date 07/24/20      PT SHORT TERM GOAL #4   Title Patient will report pain level of cervical region </= 4/10 and lumbar region </= 6/10 in order to reduce functional limitation    Time 4    Period Weeks    Status Achieved    Target Date 07/24/20             PT Long Term Goals - 06/26/20 1110      PT LONG TERM GOAL #1   Title Patient will be I with final HEP to maintain progress from PT    Time 8    Period Weeks    Status New    Target Date 08/21/20      PT LONG TERM GOAL #2   Title Patient will exhibit proper posture and cervical motion WFL / equal bilaterally in order to perform household tasks and reading without limitation    Time 8    Period Weeks    Status New    Target Date 08/21/20      PT LONG TERM GOAL #3   Title Patient will demonstrate improve core and periscapular strength >/= 4/5 MMT to improve walking tolerance >/= 30 minutes    Time 8    Period Weeks    Status New    Target Date 08/21/20      PT LONG TERM GOAL #4   Title Patient will exhibit a >/=   25% increase in lumbar motion of all planes to improve bending and lifting ability    Time 8    Period Weeks    Status New    Target Date 08/21/20      PT LONG TERM GOAL #5   Title Patient will report improved functional status of >/= 60% on FOTO    Time 8    Period Weeks    Status New    Target Date 08/21/20                 Plan - 07/24/20 1356    Clinical Impression Statement Patient tolerated therapy well with no adverse effects. Continued to progress lumbar and cervical mobility exercises and work on postural and core strengthening. She is tolerating the progressions well and did not report any increase in pain this visit.  Incorporated lifting this visit and patient did require cueing for proper hip hinge technique to avoid increased lumbar flexion. She would benefit from continued skilled PT to progress her mobility and strength in order to reduce pain and maximize functional level.    PT Treatment/Interventions ADLs/Self Care Home Management;Cryotherapy;Electrical Stimulation;Moist Heat;Traction;Neuromuscular re-education;Balance training;Therapeutic exercise;Therapeutic activities;Functional mobility training;Stair training;Gait training;Patient/family education;Manual techniques;Aquatic Therapy;Passive range of motion;Dry needling;Joint Manipulations;Spinal Manipulations;Taping    PT Next Visit Plan Review HEP and progress PRN, manual / dry needling for right upper trap region, continue light cervical and lumbar stretching, progress core and postural strengthening (chin tucks)    PT Home Exercise Plan [id number]: piriformis stretch, seated lumbar stretch with physioball, LTR, bridge with ball squeeze, pelvic tilt, supine clam with red, march with red, supine shoulder horiz abduction with yellow, seated chin tuck, row with blue, SMFR using ball, sit<>stand    Consulted and Agree with Plan of Care Patient           Patient will benefit from skilled therapeutic intervention in order to improve the following deficits and impairments:  Decreased range of motion, Difficulty walking, Pain, Decreased activity tolerance, Postural dysfunction, Decreased strength, Impaired flexibility, Improper body mechanics  Visit Diagnosis: Cervicalgia  Chronic bilateral low back pain, unspecified whether sciatica present  Muscle weakness (generalized)  Other abnormalities of gait and mobility     Problem List Patient Active Problem List   Diagnosis Date Noted  . Type 2 diabetes mellitus with hyperglycemia, with long-term current use of insulin ([place]) 05/31/2020  . Fibromyalgia 05/31/2020  . Mouth ulcers 05/25/2020  . Elevated  glucose 12/23/2019  . Diabetic ketoacidosis without coma associated with type 2 diabetes mellitus ([place]) 12/23/2019  . De Quervain's tenosynovitis, left 11/16/2018  . Bilateral carpal tunnel syndrome 04/13/2018  . Leukocytosis 10/04/2017  . Hyperlipidemia 08/04/2017  . Ulcerative colitis with complication ([place]) 62/95/2841  . Plantar fasciitis 05/28/2017    Hilda Blades, PT, DPT, LAT, ATC 07/24/20  2:50 PM Phone: [phone] Fax: [facility] [address] [place], [place], [address] Phone: [phone]   Fax:  [phone]  Name: Beverly Ramirez MRN: [id number] Date of Birth: Oct 06, 1960   PHYSICAL THERAPY DISCHARGE SUMMARY  Visits from Start of Care: 4  Current functional level related to goals / functional outcomes: See above   Remaining deficits: See above   Education / Equipment: HEP Plan: Patient agrees to discharge.  Patient goals were not met. Patient is being discharged due to not returning since the last visit.  ?????    Hilda Blades, PT, DPT, LAT, ATC 09/10/20  8:32 AM Phone: [phone] Fax: [phone]

## 2020-08-07 DIAGNOSIS — H838X3 Other specified diseases of inner ear, bilateral: Secondary | ICD-10-CM | POA: Diagnosis not present

## 2020-08-07 DIAGNOSIS — R42 Dizziness and giddiness: Secondary | ICD-10-CM | POA: Diagnosis not present

## 2020-08-07 DIAGNOSIS — H903 Sensorineural hearing loss, bilateral: Secondary | ICD-10-CM | POA: Diagnosis not present

## 2020-08-07 DIAGNOSIS — K1230 Oral mucositis (ulcerative), unspecified: Secondary | ICD-10-CM | POA: Diagnosis not present

## 2020-08-24 ENCOUNTER — Encounter: Payer: Medicare Other | Attending: Endocrinology | Admitting: Dietician

## 2020-08-24 ENCOUNTER — Other Ambulatory Visit: Payer: Self-pay

## 2020-08-24 ENCOUNTER — Encounter: Payer: Self-pay | Admitting: Dietician

## 2020-08-24 DIAGNOSIS — Z794 Long term (current) use of insulin: Secondary | ICD-10-CM | POA: Diagnosis not present

## 2020-08-24 DIAGNOSIS — E1165 Type 2 diabetes mellitus with hyperglycemia: Secondary | ICD-10-CM | POA: Insufficient documentation

## 2020-08-24 NOTE — Progress Notes (Signed)
Diabetes Self-Management Education  Visit Type: Follow-up  Appt. Start Time: 1120 Appt. End Time: 1200  08/24/2020  Beverly Ramirez, identified by name and date of birth, is a 59 y.o. female with a diagnosis of Diabetes:  .   ASSESSMENT Patient is here today alone and was last seen by myself 06/01/2020. Needs further education re:  Carb counting on next visit. She states that her colitis is under better control and has not required further steroids. She does the exercises recommended by PT but is not walking as frequently as her daughter moved and took the dog. She has had a couple incidents of low blood sugar.  She forgot the Novolog and took this after the meal. Fasting readings usually 140-150 but occasionally 100 or 175. Blood glucose 140-150 HS per patient. States that she has continued to avoid most soda and uses [place] instead. Weight 159 lbs overall stable.  History includes Type 2 Diabetes, OSA (just diagnosed and to pick up a c-pap), fibromyalgia, ulcerative colitis A1C 7.1% 04/20/20, 8% 03/02/2020, 9.3% 02/02/2020 Medications include: Basaglar 25 units q HS, Novolog 22 units before each meal, Farxiga (does not tolerate metformin)  Weight hx:  158 lbs 05/31/2020 167 lbs end of May 2021 decreased to 147 lbs with illness in May. 154 lbs 04/13/2020  Patient lives with her husband and daughter and moved from [place] about 3 years ago. She worked for the [employer].  She does the shopping and her and her daughter share the cooking. Weight 159 lb (72.1 kg). Body mass index is 29.08 kg/m.   Diabetes Self-Management Education - 08/24/20 1216      Visit Information   Visit Type Follow-up      Psychosocial Assessment   Self-management support Doctor's office;Family    Other persons present Patient      Pre-Education Assessment   Patient understands the diabetes disease and treatment process. Demonstrates understanding / competency    Patient  understands incorporating nutritional management into lifestyle. Needs Review    Patient undertands incorporating physical activity into lifestyle. Needs Review    Patient understands using medications safely. Demonstrates understanding / competency    Patient understands monitoring blood glucose, interpreting and using results Demonstrates understanding / competency    Patient understands prevention, detection, and treatment of acute complications. Demonstrates understanding / competency    Patient understands prevention, detection, and treatment of chronic complications. Demonstrates understanding / competency    Patient understands how to develop strategies to address psychosocial issues. Needs Review    Patient understands how to develop strategies to promote health/change behavior. Needs Review      Complications   Last HgB A1C per patient/outside source 7.2 %   07/2020 stable from 7.1%  04/2020   Fasting Blood glucose range (mg/dL) 70-129;130-179    Postprandial Blood glucose range (mg/dL) 70-129;130-179      Dietary Intake   Breakfast boiled egg, toast, oatmeal, fruit, truvia OR occasional biscuit rather than toast    Lunch sandwich, fruit, occasional chips OR soup and salad OR salad with beans    Snack (afternoon) occasional fruit or celery    Dinner baked chicken or salmon, rice or potatoes or succatosh, non starchy vegetables    Beverage(s) water, occasional diluted regular gingerale, hot tea, unsweetened tea with Monia Pouch      Exercise   Exercise Type Light (walking / raking leaves)    How many days per week to you exercise? 6    How many minutes  per day do you exercise? 20    Total minutes per week of exercise 120      Patient Education   Previous Diabetes Education Yes (please comment)   04/2020   Nutrition management  Food label reading, portion sizes and measuring food.;Carbohydrate counting;Meal options for control of blood glucose level and chronic complications.     Physical activity and exercise  Role of exercise on diabetes management, blood pressure control and cardiac health.;Helped patient identify appropriate exercises in relation to his/her diabetes, diabetes complications and other health issue.    Medications Reviewed patients medication for diabetes, action, purpose, timing of dose and side effects.    Monitoring Identified appropriate SMBG and/or A1C goals.    Acute complications Taught treatment of hypoglycemia - the 15 rule.      Individualized Goals (developed by patient)   Nutrition Follow meal plan discussed    Physical Activity Exercise 5-7 days per week;30 minutes per day    Medications take my medication as prescribed    Monitoring  test my blood glucose as discussed    Health Coping discuss diabetes with (comment)   MD. RD, CDCES     Patient Self-Evaluation of Goals - Patient rates self as meeting previously set goals (% of time)   Nutrition >75%    Physical Activity >75%    Medications >75%    Monitoring >75%    Problem Solving >75%    Reducing Risk >75%    Health Coping >75%      Post-Education Assessment   Patient understands the diabetes disease and treatment process. Demonstrates understanding / competency    Patient understands incorporating nutritional management into lifestyle. Needs Review    Patient undertands incorporating physical activity into lifestyle. Demonstrates understanding / competency    Patient understands using medications safely. Demonstrates understanding / competency    Patient understands monitoring blood glucose, interpreting and using results Demonstrates understanding / competency    Patient understands prevention, detection, and treatment of acute complications. Demonstrates understanding / competency    Patient understands prevention, detection, and treatment of chronic complications. Demonstrates understanding / competency    Patient understands how to develop strategies to address psychosocial  issues. Demonstrates understanding / competency    Patient understands how to develop strategies to promote health/change behavior. Needs Review      Outcomes   Expected Outcomes Demonstrated interest in learning. Expect positive outcomes    Future DMSE 2 months    Program Status Completed      Subsequent Visit   Since your last visit have you continued or begun to take your medications as prescribed? Yes    Since your last visit have you experienced any weight changes? Gain    Weight Gain (lbs) 1           Individualized Plan for Diabetes Self-Management Training:   Learning Objective:  Patient will have a greater understanding of diabetes self-management. Patient education plan is to attend individual and/or group sessions per assessed needs and concerns.   Plan:   Patient Instructions  Continue to stay active.  Consider walking 30 minutes most days. Decreased saturated fat  (remove skin from chicken, smart balance lite rather than butter or less butter). Continue to be mindful about your beverages.  Regular soda will make your blood sugar go up. Fresh fruit rather than dried fruit most often Be mindful about your carbohydrates Be sure to take the Novolog before you eat.   Expected Outcomes:  Demonstrated interest in  learning. Expect positive outcomes  Education material provided:   If problems or questions, patient to contact team via:  Phone  Future DSME appointment: 2 months

## 2020-08-24 NOTE — Patient Instructions (Addendum)
Continue to stay active.  Consider walking 30 minutes most days. Decreased saturated fat  (remove skin from chicken, smart balance lite rather than butter or less butter). Continue to be mindful about your beverages.  Regular soda will make your blood sugar go up. Fresh fruit rather than dried fruit most often Be mindful about your carbohydrates Be sure to take the Novolog before you eat.

## 2020-09-04 ENCOUNTER — Telehealth: Payer: Self-pay | Admitting: Family Medicine

## 2020-09-05 ENCOUNTER — Ambulatory Visit: Payer: Medicare Other | Admitting: Family Medicine

## 2020-09-11 ENCOUNTER — Ambulatory Visit (INDEPENDENT_AMBULATORY_CARE_PROVIDER_SITE_OTHER): Payer: Medicare Other | Admitting: Family Medicine

## 2020-09-11 ENCOUNTER — Encounter: Payer: Self-pay | Admitting: Family Medicine

## 2020-09-11 ENCOUNTER — Other Ambulatory Visit: Payer: Self-pay

## 2020-09-11 VITALS — BP 148/88 | HR 72 | Temp 98.6°F | Ht 62.0 in | Wt 163.0 lb

## 2020-09-11 DIAGNOSIS — R42 Dizziness and giddiness: Secondary | ICD-10-CM | POA: Diagnosis not present

## 2020-09-11 DIAGNOSIS — E876 Hypokalemia: Secondary | ICD-10-CM | POA: Diagnosis not present

## 2020-09-11 DIAGNOSIS — D329 Benign neoplasm of meninges, unspecified: Secondary | ICD-10-CM | POA: Diagnosis not present

## 2020-09-11 DIAGNOSIS — R519 Headache, unspecified: Secondary | ICD-10-CM | POA: Diagnosis not present

## 2020-09-11 NOTE — Progress Notes (Signed)
Subjective:  Patient ID: Beverly Ramirez, female    DOB: May 15, 1961  Age: 60 y.o. MRN: [id number]  CC:  Chief Complaint  Patient presents with  . Headache    Headache and dizziness for few months now. Have been seen by ENT. Headache and dizziness is no better. Have an upcoming appt with ENT on 09/19/20. Was seen by ED on 05/08/20 and they done a CT scan which showed a spot on right side of head(growth)    HPI Beverly Ramirez presents for   Headache/dizziness  Previous patient of Dr. Pamella Pert.  History of ulcerative colitis, hyperlipidemia, hypertension, diabetes, fibromyalgia.  Has been treated previously for upper back and neck pain, gabapentin was too sedating, muscle relaxant as needed due to sedation.  Referred to physical therapy in September for neck and back pain.  Cymbalta for fibromyalgia.  Reports headache/dizziness symptoms - off and on for past year. Headaches have been more frequent past 2 weeks, same HA as in past, just more frequent. Usually in back of head, and into neck recently.  No new photophobia or phonophobia.  When having headaches, and dizzy - eyes are blurry as well - mostly R side. Seen by optho for these symptoms - did not see any concerns. Same sx's past year, just more frequent. Less frequent blurry vision. More headaches wih dizziness.  Unable to take nsaids d/t colitis.  Referred to neurology in 08/2019 for HA /dizziness.  Diagnosed with OSA last year, sleep study 09/25/19 - AHI 24 in REM.  plan for autopap - some difficulty getting machine - has not had follow up with sleep specialist.  Has been evaluated by ENT, next appointment on January 12 for other testing.   Evaluated in April 2020 ER with headache, dizziness, nausea, had CT head performed without acute intracranial abnormality with white matter changes that are stable from January 2021 MRI.  MRI brain September 21, 2019 indicated bilateral periventricular and subcortical white matter hyperintensities  and tiny enhancing lesion of the right internal auditory canal likely representing small intracanicular schwannoma. Saw neurosurgery in 10/05/19, Dr. Kathyrn Sheriff.  no surgery recommended, plan for ongoing monitoring.  She does have diabetes, treated by endocrinology.  Last visit in November.  A1c 7.2 at that time.  No Cp/palpitations with dizziness.  No melena/hematochezia.  Not worst HA of life. Unable to take tylenol or nsaids - ? Concern for liver with tylenol.  Has not been using heat to neck recently - used prior. Seemed to help.   Potassium 3.4 in 05/07/20.   History Patient Active Problem List   Diagnosis Date Noted  . Type 2 diabetes mellitus with hyperglycemia, with long-term current use of insulin ([place]) 05/31/2020  . Fibromyalgia 05/31/2020  . Mouth ulcers 05/25/2020  . Elevated glucose 12/23/2019  . Diabetic ketoacidosis without coma associated with type 2 diabetes mellitus ([place]) 12/23/2019  . De Quervain's tenosynovitis, left 11/16/2018  . Bilateral carpal tunnel syndrome 04/13/2018  . Leukocytosis 10/04/2017  . Hyperlipidemia 08/04/2017  . Ulcerative colitis with complication ([place]) 53/74/8270  . Plantar fasciitis 05/28/2017   Past Medical History:  Diagnosis Date  . Anemia   . Carpal tunnel syndrome    bilateral  . Chronic heel pain   . Diabetes mellitus without complication ([place])   . DJD (degenerative joint disease) of cervical spine    MRI 2017  . Hyperlipidemia   . Plantar fasciitis    bilateral  . Sickle cell anemia (HCC)    sickle cell trait  . Sleep  apnea   . Tendonitis    left hand/wrist  . UC (ulcerative colitis) ([place])    Past Surgical History:  Procedure Laterality Date  . ABDOMINAL HYSTERECTOMY    . APPENDECTOMY    . CESAREAN SECTION     x4  . COLONOSCOPY     Multiple in [place]  . ESOPHAGOGASTRODUODENOSCOPY     Allergies  Allergen Reactions  . Codeine Itching, Rash and Hives  . Latex Hives    With the power   Prior to Admission  medications   Medication Sig Start Date End Date Taking? Authorizing Provider  albuterol (VENTOLIN HFA) 108 (90 Base) MCG/ACT inhaler Inhale 2 puffs into the lungs every 6 (six) hours as needed for wheezing or shortness of breath. 03/13/20  Yes Jacelyn Pi, Lilia Argue, MD  amLODipine (NORVASC) 5 MG tablet TAKE 1 TABLET BY MOUTH EVERY DAY 06/08/20  Yes Jacelyn Pi, Lilia Argue, MD  atorvastatin (LIPITOR) 40 MG tablet Take 1 tablet (40 mg total) by mouth daily. 02/10/20  Yes Jacelyn Pi, Lilia Argue, MD  dapagliflozin propanediol (FARXIGA) 5 MG TABS tablet Take 1 tablet (5 mg total) by mouth daily before breakfast. 07/23/20  Yes Shamleffer, Melanie Crazier, MD  dicyclomine (BENTYL) 20 MG tablet Take 1 tablet (20 mg total) by mouth 3 (three) times daily as needed for spasms (abdominal cramping). 05/08/20  Yes Long, Wonda Olds, MD  diphenoxylate-atropine (LOMOTIL) 2.5-0.025 MG tablet Take 1 tablet by mouth 4 (four) times daily as needed for diarrhea or loose stools. 04/27/20  Yes Gatha Mayer, MD  DULoxetine (CYMBALTA) 60 MG capsule TAKE 1 CAPSULE BY MOUTH EVERY DAY 03/20/20  Yes Jacelyn Pi, Lilia Argue, MD  esomeprazole (NEXIUM) 40 MG capsule TAKE 1 CAPSULE BY MOUTH EVERY DAY BEFORE BREAKFAST 07/20/20  Yes Gatha Mayer, MD  fluticasone [facility]) 50 MCG/ACT nasal spray SPRAY 2 SPRAYS INTO EACH NOSTRIL EVERY DAY Patient taking differently: Patient takes as needed 03/08/20  Yes Jacelyn Pi, Irma M, MD  glucose blood [facility] VERIO) test strip Check blood glucose 3-4 x day. Dx E11.9, E11.649, z79.4, 05/04/20  Yes Jacelyn Pi, Lilia Argue, MD  insulin aspart (NOVOLOG FLEXPEN) 100 UNIT/ML FlexPen Inject 22 Units into the skin 3 (three) times daily with meals. 07/23/20  Yes Shamleffer, Melanie Crazier, MD  Insulin Glargine (BASAGLAR KWIKPEN) 100 UNIT/ML Inject 32 Units into the skin at bedtime. 07/23/20  Yes Shamleffer, Melanie Crazier, MD  Insulin Pen Needle 32G X 4 MM MISC 1 Device by Does not apply route in the morning, at  noon, in the evening, and at bedtime. 07/23/20  Yes Shamleffer, Melanie Crazier, MD  Lancets [facility] DELICA PLUS [id number]) [place] 1 each by Does not apply route 3 (three) times daily. Dx E11.65, z79.4 01/24/19  Yes Jacelyn Pi, Lilia Argue, MD  lidocaine (XYLOCAINE) 2 % solution Use as directed 15 mLs in the mouth or throat as needed for mouth pain. 03/09/20  Yes Gatha Mayer, MD  mesalamine (LIALDA) 1.2 g EC tablet Take 2 tablets (2.4 g total) by mouth 2 (two) times daily. 04/28/20 04/23/21 Yes Gatha Mayer, MD  methocarbamol (ROBAXIN) 500 MG tablet Take 1 tablet (500 mg total) by mouth every 8 (eight) hours as needed for muscle spasms. 05/31/20  Yes Jacelyn Pi, Irma M, MD  montelukast (SINGULAIR) 10 MG tablet TAKE 1 TABLET BY MOUTH EVERYDAY AT BEDTIME 05/27/20  Yes Maximiano Coss, NP  Multiple Vitamins-Minerals (WOMENS 50+ [place] VITAMIN/MIN PO) Take by mouth.   Yes [provider]  NONFORMULARY OR COMPOUNDED ITEM Swish and spit 5 mLs as needed (ulcer flare up in mouth). Magic mouthwash with lidocaine disp#150 ml 05/04/20  Yes Gatha Mayer, MD  ondansetron (ZOFRAN ODT) 4 MG disintegrating tablet Take 1 tablet (4 mg total) by mouth every 8 (eight) hours as needed for nausea or vomiting. 05/08/20  Yes Long, Wonda Olds, MD  triamcinolone cream (KENALOG) 0.1 % Apply 1 application topically 2 (two) times daily. 04/19/20  Yes Jacelyn Pi, Lilia Argue, MD   Social History   Socioeconomic History  . Marital status: Married    Spouse name: Simona Huh  . Number of children: 4  . Years of education: Not on file  . Highest education level: Not on file  Occupational History  . Occupation: retired    Comment: He formerly worked for [employer], disabled due to a fall  Tobacco Use  . Smoking status: Former Smoker    Types: Cigarettes    Quit date: 10/13/2006    Years since quitting: 13.9  . Smokeless tobacco: Never Used  Vaping Use  . Vaping Use: Never used  Substance and Sexual  Activity  . Alcohol use: Yes    Comment: occasional  . Drug use: No  . Sexual activity: Yes    Partners: Male    Comment: 1ST intercourse- 74, partners- 30, married- 20 yrs   Other Topics Concern  . Not on file  Social History Narrative   She is married, 3 sons born 1979, 1981, 1984.  One daughter born in 9.   She is medically retired from the [employer] after a fall and a hip injury, around 2004.   Caffeinated beverage every other day x1   07/13/2017   Social Determinants of Health   Financial Resource Strain: Not on file  Food Insecurity: Not on file  Transportation Needs: Not on file  Physical Activity: Not on file  Stress: Not on file  Social Connections: Not on file  Intimate Partner Violence: Not on file    Review of Systems  Per HPI.  Objective:   Vitals:   09/11/20 1110 09/11/20 1131  BP: (!) 150/98 (!) 148/88  Pulse: 72   Temp: 98.6 F (37 C)   TempSrc: Temporal   SpO2: 100%   Weight: 163 lb (73.9 kg)   Height: 5' 2"  (1.575 m)      Physical Exam Vitals reviewed.  Constitutional:      Appearance: She is well-developed and well-nourished.  HENT:     Head: Normocephalic and atraumatic.  Eyes:     Extraocular Movements: Extraocular movements intact and EOM normal.     Conjunctiva/sclera: Conjunctivae normal.     Pupils: Pupils are equal, round, and reactive to light.     Comments: No apparent nystagmus.   Neck:     Vascular: No carotid bruit.  Cardiovascular:     Rate and Rhythm: Normal rate and regular rhythm.     Pulses: Intact distal pulses.     Heart sounds: Normal heart sounds.  Pulmonary:     Effort: Pulmonary effort is normal.     Breath sounds: Normal breath sounds.  Abdominal:     Palpations: Abdomen is soft. There is no pulsatile mass.     Tenderness: There is no abdominal tenderness.  Skin:    General: Skin is warm and dry.  Neurological:     Mental Status: She is alert and oriented to person, place, and time.  GCS: GCS eye subscore is 4. GCS verbal subscore is 5. GCS motor subscore is 6.     Cranial Nerves: No cranial nerve deficit, dysarthria or facial asymmetry.     Motor: Motor function is intact. No weakness, tremor or pronator drift.     Coordination: Coordination is intact. Romberg sign negative.     Gait: Gait is intact.  Psychiatric:        Mood and Affect: Mood and affect normal.        Behavior: Behavior normal.     Assessment & Plan:  Beverly Siers-Hines is a 60 y.o. female . Meningioma ([place]) - Plan: MR Brain W Wo Contrast Nonintractable episodic headache, unspecified headache type - Plan: MR Brain W Wo Contrast, Ambulatory referral to Neurology Episode of dizziness - Plan: MR Brain W Wo Contrast, Ambulatory referral to Neurology, CBC, Comprehensive metabolic panel  -Longstanding episodic headache with dizziness, intermittent blurry vision in the past which has improved.  Similar headaches is in the past with some increased frequency recently.  Possibly multifactorial with cervical source, migraine, untreated OSA.  No apparent focal neurologic findings on exam in office today, denies this being her worst headache of her life.  Previous imaging as above.  Meningioma, schwannoma evaluated by neurosurgery last year with recommended repeat imaging in 1 year.  -Repeat MRI ordered, refer to neuro with previous provider who treated headaches as well as sleep apnea.  ER/RTC precautions given if any acute changes/worsening or new symptoms with headaches.  Understanding expressed  -Check CBC, electrolytes with dizziness, but likely related to headaches.  Hypokalemia - Plan: Comprehensive metabolic panel  -Mild on previous labs, repeat electrolytes.  No orders of the defined types were placed in this encounter.  Patient Instructions     Headaches may be due to multiple causes.  Call [facility] to work on starting CPAP for sleep apnea, as well as discussing headache an dizziness. I will refer  you back to Dr. Rexene Alberts.  [facility] Phone: [phone]  If worst headache of life or any worsening of symptoms, be seen in the ER.  I will order updated MRI for prior abnormality seen in January 2021.  Try heat or ice to neck if that helped before, and exercises form physical therapy may help if headache is from your neck.   Return to the clinic or go to the nearest emergency room if any of your symptoms worsen or new symptoms occur.    If you have lab work done today you will be contacted with your lab results within the next 2 weeks.  If you have not heard from [place] then please contact us. The fastest way to get your results is to register for My Chart.   IF you received an x-ray today, you will receive an invoice from [facility]. Please contact [facility] at [phone] with questions or concerns regarding your invoice.   IF you received labwork today, you will receive an invoice from [place]. Please contact [facility] at [phone] with questions or concerns regarding your invoice.   Our billing staff will not be able to assist you with questions regarding bills from these companies.  You will be contacted with the lab results as soon as they are available. The fastest way to get your results is to activate your My Chart account. Instructions are located on the last page of this paperwork. If you have not heard from [place] regarding the results in 2 weeks, please contact  this office.          Signed, Merri Ray, MD [facility]

## 2020-09-11 NOTE — Patient Instructions (Addendum)
   Headaches may be due to multiple causes.  Call [facility] to work on starting CPAP for sleep apnea, as well as discussing headache an dizziness. I will refer you back to Dr. Rexene Alberts.  [facility] Phone: [phone]  If worst headache of life or any worsening of symptoms, be seen in the ER.  I will order updated MRI for prior abnormality seen in January 2021.  Try heat or ice to neck if that helped before, and exercises form physical therapy may help if headache is from your neck.   Return to the clinic or go to the nearest emergency room if any of your symptoms worsen or new symptoms occur.    If you have lab work done today you will be contacted with your lab results within the next 2 weeks.  If you have not heard from [place] then please contact us. The fastest way to get your results is to register for My Chart.   IF you received an x-ray today, you will receive an invoice from [facility]. Please contact [facility] at [phone] with questions or concerns regarding your invoice.   IF you received labwork today, you will receive an invoice from [place]. Please contact [facility] at [phone] with questions or concerns regarding your invoice.   Our billing staff will not be able to assist you with questions regarding bills from these companies.  You will be contacted with the lab results as soon as they are available. The fastest way to get your results is to activate your My Chart account. Instructions are located on the last page of this paperwork. If you have not heard from [place] regarding the results in 2 weeks, please contact this office.

## 2020-09-12 ENCOUNTER — Ambulatory Visit (HOSPITAL_COMMUNITY)
Admission: EM | Admit: 2020-09-12 | Discharge: 2020-09-12 | Disposition: A | Discharge status: Home / Self Care | Payer: Managed Care, Other (non HMO) | Attending: Family Medicine | Admitting: Family Medicine

## 2020-09-12 ENCOUNTER — Encounter (HOSPITAL_COMMUNITY): Payer: Self-pay

## 2020-09-12 DIAGNOSIS — I809 Phlebitis and thrombophlebitis of unspecified site: Secondary | ICD-10-CM

## 2020-09-12 LAB — COMPREHENSIVE METABOLIC PANEL
ALT: 53 IU/L — ABNORMAL HIGH (ref 0–32)
AST: 34 IU/L (ref 0–40)
Albumin/Globulin Ratio: 1.4 (ref 1.2–2.2)
Albumin: 3.9 g/dL (ref 3.8–4.9)
Alkaline Phosphatase: 127 IU/L — ABNORMAL HIGH (ref 44–121)
BUN/Creatinine Ratio: 14 (ref 9–23)
BUN: 12 mg/dL (ref 6–24)
Bilirubin Total: 0.2 mg/dL (ref 0.0–1.2)
CO2: 28 mmol/L (ref 20–29)
Calcium: 9.1 mg/dL (ref 8.7–10.2)
Chloride: 107 mmol/L — ABNORMAL HIGH (ref 96–106)
Creatinine, Ser: 0.85 mg/dL (ref 0.57–1.00)
GFR calc Af Amer: 87 mL/min/{1.73_m2} (ref >59)
GFR calc non Af Amer: 76 mL/min/{1.73_m2} (ref >59)
Globulin, Total: 2.8 g/dL (ref 1.5–4.5)
Glucose: 79 mg/dL (ref 65–99)
Potassium: 3.8 mmol/L (ref 3.5–5.2)
Sodium: 145 mmol/L — ABNORMAL HIGH (ref 134–144)
Total Protein: 6.7 g/dL (ref 6.0–8.5)

## 2020-09-12 LAB — CBC
Hematocrit: 38 % (ref 34.0–46.6)
Hemoglobin: 12.9 g/dL (ref 11.1–15.9)
MCH: 29.5 pg (ref 26.6–33.0)
MCHC: 33.9 g/dL (ref 31.5–35.7)
MCV: 87 fL (ref 79–97)
Platelets: 342 10*3/uL (ref 150–450)
RBC: 4.38 x10E6/uL (ref 3.77–5.28)
RDW: 12.3 % (ref 11.7–15.4)
WBC: 10.3 10*3/uL (ref 3.4–10.8)

## 2020-09-12 MED ORDER — KETOROLAC TROMETHAMINE 30 MG/ML IJ SOLN
30.0000 mg | Freq: Once | INTRAMUSCULAR | Status: AC
  Administered 2020-09-12: 30 mg via INTRAMUSCULAR

## 2020-09-12 MED ORDER — KETOROLAC TROMETHAMINE 30 MG/ML IJ SOLN: 30.0000 mg | Freq: Once | INTRAMUSCULAR | Status: DC

## 2020-09-12 MED ORDER — KETOROLAC TROMETHAMINE 30 MG/ML IJ SOLN
INTRAMUSCULAR | Status: AC
  Filled 2020-09-12: qty 1

## 2020-09-12 NOTE — ED Provider Notes (Signed)
[facility]    CSN: [id number] Arrival date & time: 09/12/20  1028      History   Chief Complaint Chief Complaint  Patient presents with  . left arm swelling    HPI Beverly Ramirez is a 60 y.o. female.   Left forearm redness, localized swelling and ttp that started shortly after a blood draw through PCP yesterday afternoon. Has been using ice and elevating arm with mild pain relief. Denies CP, SOB, palpitations, hx of blood clotting issues, headaches, fever.      Past Medical History:  Diagnosis Date  . Anemia   . Carpal tunnel syndrome    bilateral  . Chronic heel pain   . Diabetes mellitus without complication ([place])   . DJD (degenerative joint disease) of cervical spine    MRI 2017  . Hyperlipidemia   . Plantar fasciitis    bilateral  . Sickle cell anemia (HCC)    sickle cell trait  . Sleep apnea   . Tendonitis    left hand/wrist  . UC (ulcerative colitis) [facility])     Patient Active Problem List   Diagnosis Date Noted  . Type 2 diabetes mellitus with hyperglycemia, with long-term current use of insulin ([place]) 05/31/2020  . Fibromyalgia 05/31/2020  . Mouth ulcers 05/25/2020  . Elevated glucose 12/23/2019  . Diabetic ketoacidosis without coma associated with type 2 diabetes mellitus ([place]) 12/23/2019  . De Quervain's tenosynovitis, left 11/16/2018  . Bilateral carpal tunnel syndrome 04/13/2018  . Leukocytosis 10/04/2017  . Hyperlipidemia 08/04/2017  . Ulcerative colitis with complication ([place]) 28/76/8115  . Plantar fasciitis 05/28/2017    Past Surgical History:  Procedure Laterality Date  . ABDOMINAL HYSTERECTOMY    . APPENDECTOMY    . CESAREAN SECTION     x4  . COLONOSCOPY     Multiple in [place]  . ESOPHAGOGASTRODUODENOSCOPY      OB History    Gravida  4   Para  4   Term      Preterm      AB      Living  4     SAB      IAB      Ectopic      Multiple      Live Births               Home Medications     Prior to Admission medications   Medication Sig Start Date End Date Taking? Authorizing Provider  albuterol (VENTOLIN HFA) 108 (90 Base) MCG/ACT inhaler Inhale 2 puffs into the lungs every 6 (six) hours as needed for wheezing or shortness of breath. 03/13/20   Jacelyn Pi, Lilia Argue, MD  amLODipine (NORVASC) 5 MG tablet TAKE 1 TABLET BY MOUTH EVERY DAY 06/08/20   Jacelyn Pi, Lilia Argue, MD  atorvastatin (LIPITOR) 40 MG tablet Take 1 tablet (40 mg total) by mouth daily. 02/10/20   Jacelyn Pi, Lilia Argue, MD  dapagliflozin propanediol (FARXIGA) 5 MG TABS tablet Take 1 tablet (5 mg total) by mouth daily before breakfast. 07/23/20   Shamleffer, Melanie Crazier, MD  dicyclomine (BENTYL) 20 MG tablet Take 1 tablet (20 mg total) by mouth 3 (three) times daily as needed for spasms (abdominal cramping). 05/08/20   Long, Wonda Olds, MD  diphenoxylate-atropine (LOMOTIL) 2.5-0.025 MG tablet Take 1 tablet by mouth 4 (four) times daily as needed for diarrhea or loose stools. 04/27/20   Gatha Mayer, MD  DULoxetine (CYMBALTA) 60 MG capsule TAKE 1 CAPSULE  BY MOUTH EVERY DAY 03/20/20   Jacelyn Pi, Lilia Argue, MD  esomeprazole (NEXIUM) 40 MG capsule TAKE 1 CAPSULE BY MOUTH EVERY DAY BEFORE BREAKFAST 07/20/20   Gatha Mayer, MD  fluticasone [facility]) 50 MCG/ACT nasal spray SPRAY 2 SPRAYS INTO EACH NOSTRIL EVERY DAY Patient taking differently: Patient takes as needed 03/08/20   Jacelyn Pi, Lilia Argue, MD  glucose blood (ONETOUCH VERIO) test strip Check blood glucose 3-4 x day. Dx E11.9, E11.649, z79.4, 05/04/20   Jacelyn Pi, Lilia Argue, MD  insulin aspart (NOVOLOG FLEXPEN) 100 UNIT/ML FlexPen Inject 22 Units into the skin 3 (three) times daily with meals. 07/23/20   Shamleffer, Melanie Crazier, MD  Insulin Glargine (BASAGLAR KWIKPEN) 100 UNIT/ML Inject 32 Units into the skin at bedtime. 07/23/20   Shamleffer, Melanie Crazier, MD  Insulin Pen Needle 32G X 4 MM MISC 1 Device by Does not apply route in the morning, at noon, in the  evening, and at bedtime. 07/23/20   Shamleffer, Melanie Crazier, MD  Lancets [facility] DELICA PLUS [id number]) MISC 1 each by Does not apply route 3 (three) times daily. Dx E11.65, z79.4 01/24/19   Jacelyn Pi, Lilia Argue, MD  lidocaine (XYLOCAINE) 2 % solution Use as directed 15 mLs in the mouth or throat as needed for mouth pain. 03/09/20   Gatha Mayer, MD  mesalamine (LIALDA) 1.2 g EC tablet Take 2 tablets (2.4 g total) by mouth 2 (two) times daily. 04/28/20 04/23/21  Gatha Mayer, MD  methocarbamol (ROBAXIN) 500 MG tablet Take 1 tablet (500 mg total) by mouth every 8 (eight) hours as needed for muscle spasms. 05/31/20   Jacelyn Pi, Lilia Argue, MD  montelukast (SINGULAIR) 10 MG tablet TAKE 1 TABLET BY MOUTH EVERYDAY AT BEDTIME 05/27/20   Maximiano Coss, NP  Multiple Vitamins-Minerals (WOMENS 50+ [place] VITAMIN/MIN PO) Take by mouth.    [provider]  NONFORMULARY OR COMPOUNDED ITEM Swish and spit 5 mLs as needed (ulcer flare up in mouth). Magic mouthwash with lidocaine disp#150 ml 05/04/20   Gatha Mayer, MD  ondansetron (ZOFRAN ODT) 4 MG disintegrating tablet Take 1 tablet (4 mg total) by mouth every 8 (eight) hours as needed for nausea or vomiting. 05/08/20   Long, Wonda Olds, MD  triamcinolone cream (KENALOG) 0.1 % Apply 1 application topically 2 (two) times daily. 04/19/20   Daleen Squibb, MD    Family History Family History  Problem Relation Age of Onset  . Stroke Mother   . Diabetes Mother   . Hypertension Mother   . Lung cancer Mother   . Sickle cell trait Mother   . Diabetes Maternal Aunt   . Lung cancer Maternal Aunt   . Lung cancer Maternal Aunt   . Lung cancer Maternal Aunt   . Colon cancer Cousin 62  . Sickle cell trait Father   . Sickle cell anemia Sister 22  . Lung cancer Maternal Grandmother   . Sickle cell anemia Sister   . Other Sister        accident and blood clot formed    Social History Social History   Tobacco Use  . Smoking status: Former  Smoker    Types: Cigarettes    Quit date: 10/13/2006    Years since quitting: 13.9  . Smokeless tobacco: Never Used  Vaping Use  . Vaping Use: Never used  Substance Use Topics  . Alcohol use: Yes    Comment: occasional  . Drug use: No  Allergies   Codeine and Latex   Review of Systems Review of Systems PER HPI   Physical Exam Triage Vital Signs ED Triage Vitals  Enc Vitals Group     BP 09/12/20 1306 (!) 161/91     Pulse Rate 09/12/20 1306 87     Resp 09/12/20 1306 20     Temp 09/12/20 1306 97.6 F (36.4 C)     Temp src --      SpO2 09/12/20 1306 99 %     Weight --      Height --      Head Circumference --      Peak Flow --      Pain Score 09/12/20 1303 7     Pain Loc --      Pain Edu? --      Excl. in [place]? --    No data found.  Updated Vital Signs BP (!) 161/91   Pulse 87   Temp 97.6 F (36.4 C)   Resp 20   SpO2 99%   Visual Acuity Right Eye Distance:   Left Eye Distance:   Bilateral Distance:    Right Eye Near:   Left Eye Near:    Bilateral Near:     Physical Exam Vitals and nursing note reviewed.  Constitutional:      Appearance: Normal appearance. She is not ill-appearing.  HENT:     Head: Atraumatic.  Eyes:     Extraocular Movements: Extraocular movements intact.     Conjunctiva/sclera: Conjunctivae normal.  Cardiovascular:     Rate and Rhythm: Normal rate and regular rhythm.     Pulses: Normal pulses.     Heart sounds: Normal heart sounds.  Pulmonary:     Effort: Pulmonary effort is normal. No respiratory distress.     Breath sounds: Normal breath sounds.  Musculoskeletal:        General: Swelling (localized left forearm swelling just below site of blood draw) and tenderness present. Normal range of motion.     Cervical back: Normal range of motion and neck supple.  Skin:    General: Skin is warm and dry.     Findings: Erythema present.  Neurological:     Mental Status: She is alert and oriented to person, place, and time.   Psychiatric:        Mood and Affect: Mood normal.        Thought Content: Thought content normal.        Judgment: Judgment normal.      UC Treatments / Results  Labs (all labs ordered are listed, but only abnormal results are displayed) Labs Reviewed - No data to display  EKG   Radiology No results found.  Procedures Procedures (including critical care time)  Medications Ordered in UC Medications  ketorolac (TORADOL) 30 MG/ML injection 30 mg (has no administration in time range)    Initial Impression / Assessment and Plan / UC Course  I have reviewed the triage vital signs and the nursing notes.  Pertinent labs & imaging results that were available during my care of the patient were reviewed by me and considered in my medical decision making (see chart for details).     Consistent with superficial phlebitis post blood draw, discussed IM toradol given her GI issues with oral NSAIDs, tylenol, ice, warm epsom salt soaks, elevation. Close PCP f/u for recheck in next few days, ED if worsening for u/s. Patient agreeable to plan.   Final Clinical Impressions(s) / UC  Diagnoses   Final diagnoses:  Phlebitis   Discharge Instructions   None    ED Prescriptions    None     PDMP not reviewed this encounter.   Volney American, [place] 09/12/20 1355

## 2020-09-12 NOTE — ED Triage Notes (Signed)
Pt in with c/o left arm swelling that started yesterday after she had blood drawn at the doctor's office  Arm is painful to touch and swollen, redness noted to arm as well   States it feels like burning and itching  Pt has been applying ice with some relief

## 2020-09-19 DIAGNOSIS — R42 Dizziness and giddiness: Secondary | ICD-10-CM | POA: Diagnosis not present

## 2020-09-26 DIAGNOSIS — D333 Benign neoplasm of cranial nerves: Secondary | ICD-10-CM | POA: Diagnosis not present

## 2020-09-26 DIAGNOSIS — H832X1 Labyrinthine dysfunction, right ear: Secondary | ICD-10-CM | POA: Diagnosis not present

## 2020-09-26 DIAGNOSIS — H903 Sensorineural hearing loss, bilateral: Secondary | ICD-10-CM | POA: Diagnosis not present

## 2020-09-26 DIAGNOSIS — R42 Dizziness and giddiness: Secondary | ICD-10-CM | POA: Diagnosis not present

## 2020-10-03 NOTE — Telephone Encounter (Signed)
[facility] to resch due to provider being out of the office from 12/29 to 09/11/20

## 2020-10-04 ENCOUNTER — Ambulatory Visit
Admission: RE | Admit: 2020-10-04 | Discharge: 2020-10-04 | Disposition: A | Discharge status: Home / Self Care | Payer: Medicare Other | Source: Ambulatory Visit | Attending: Family Medicine | Admitting: Family Medicine

## 2020-10-04 ENCOUNTER — Other Ambulatory Visit: Payer: Self-pay

## 2020-10-04 DIAGNOSIS — D329 Benign neoplasm of meninges, unspecified: Secondary | ICD-10-CM

## 2020-10-04 DIAGNOSIS — R42 Dizziness and giddiness: Secondary | ICD-10-CM

## 2020-10-04 DIAGNOSIS — R519 Headache, unspecified: Secondary | ICD-10-CM

## 2020-10-04 DIAGNOSIS — J3489 Other specified disorders of nose and nasal sinuses: Secondary | ICD-10-CM | POA: Diagnosis not present

## 2020-10-04 DIAGNOSIS — G9389 Other specified disorders of brain: Secondary | ICD-10-CM | POA: Diagnosis not present

## 2020-10-04 IMAGING — MR MR HEAD WO/W CM
12 of 13 series · 36 of 48 positions shown · IV contrast (15ml Multihance)
Comparison: [DATE] interpreted by non-radiologist

CLINICAL DATA: Increasing headaches, meningioma, schwannoma
follow-up

EXAM:
MRI HEAD WITHOUT AND WITH CONTRAST
TECHNIQUE: Multiplanar, multiecho pulse sequences of the brain and surrounding
structures were obtained without and with intravenous contrast.
CONTRAST:  15mL MULTIHANCE GADOBENATE DIMEGLUMINE 529 MG/ML IV SOLN

[Series 2: T1 · sagittal · 5.0mm · 0.45mm/px · 3 of 21 slices shown (1 of 3)]
[im 1/21]
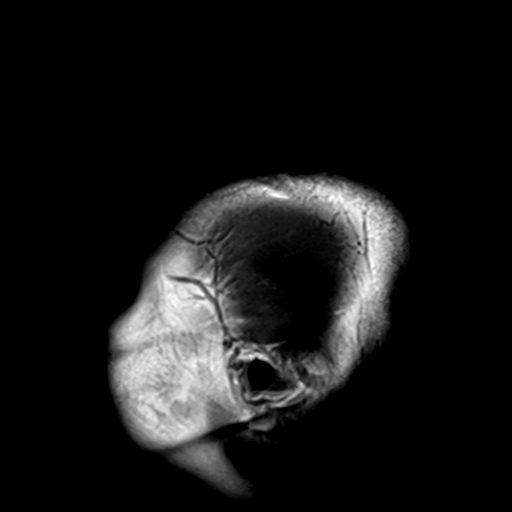
[im 11/21]
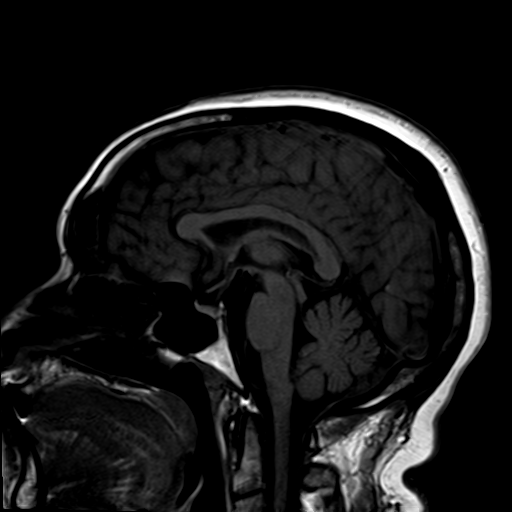
[im 21/21]
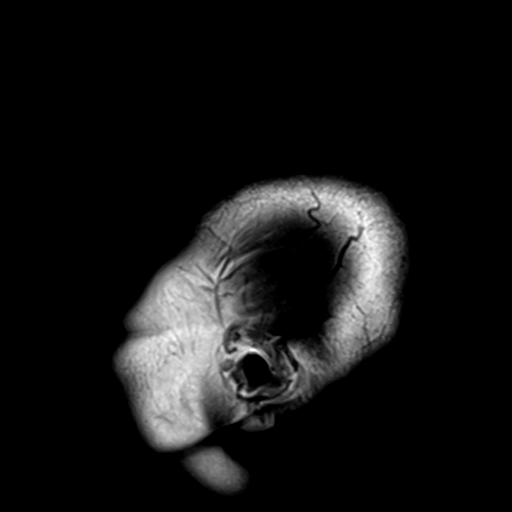

[Series 3: DWI · axial · 3.0mm · 1.80mm/px · z∈[-78,+67]mm · 9 of 100 slices shown (1 of 2)]
[im 1/100]
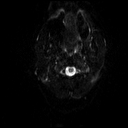
[im 19/100]
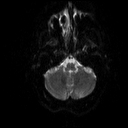
[im 28/100]
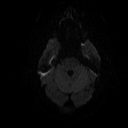
[im 46/100]
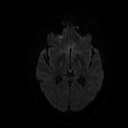
[im 55/100]
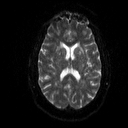
[im 73/100]
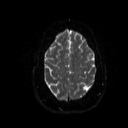
[im 82/100]
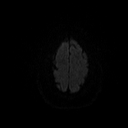
[im 91/100]
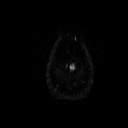
[im 100/100]
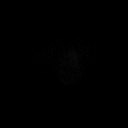

[Series 4: DWI · axial · 3.0mm · 1.80mm/px · z∈[-78,+67]mm · 5 of 50 slices shown (2 of 2)]
[im 1/50]
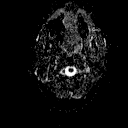
[im 13/50]
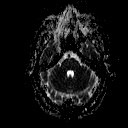
[im 25/50]
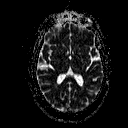
[im 37/50]
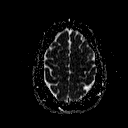
[im 50/50]
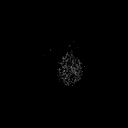

[Series 5: T2 · axial · 5.0mm · 0.45mm/px · z∈[-77,+65]mm · 2 of 23 slices shown (1 of 2)]
[im 1/23]
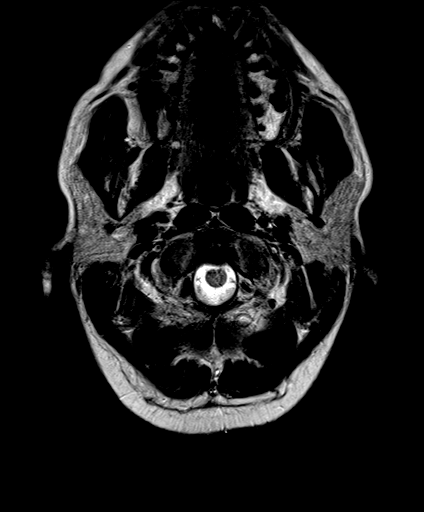
[im 23/23]
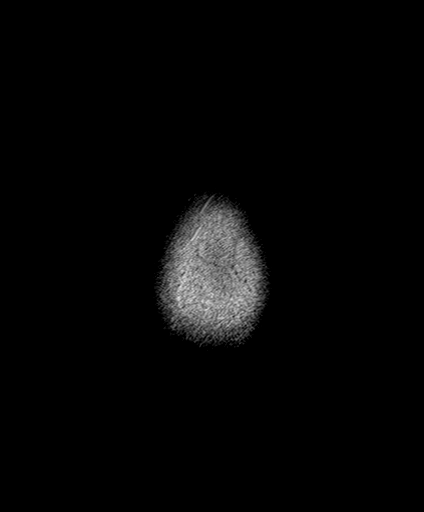

[Series 6: FLAIR · axial · 3.0mm · 0.45mm/px · z∈[-89,+66]mm · 3 of 27 slices shown]
[im 1/27]
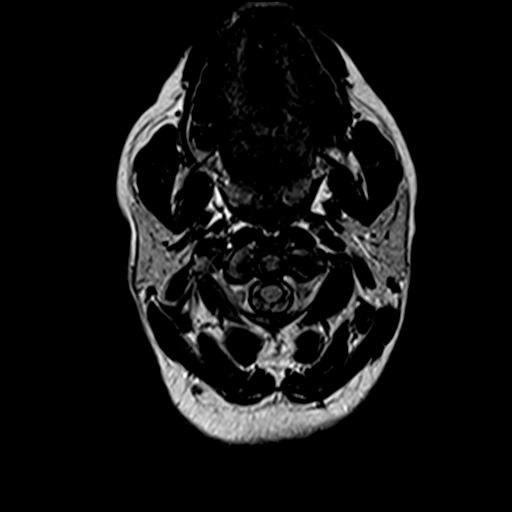
[im 14/27]
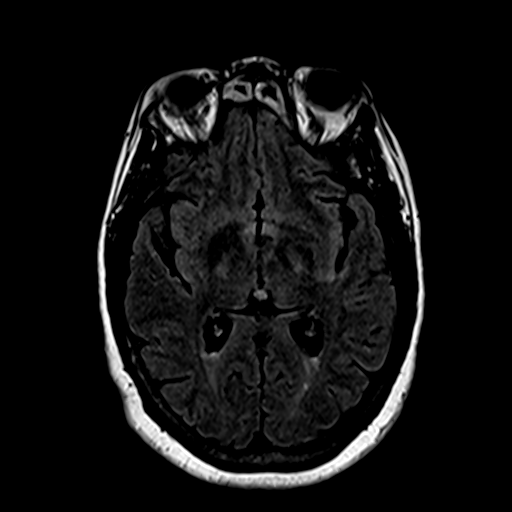
[im 27/27]
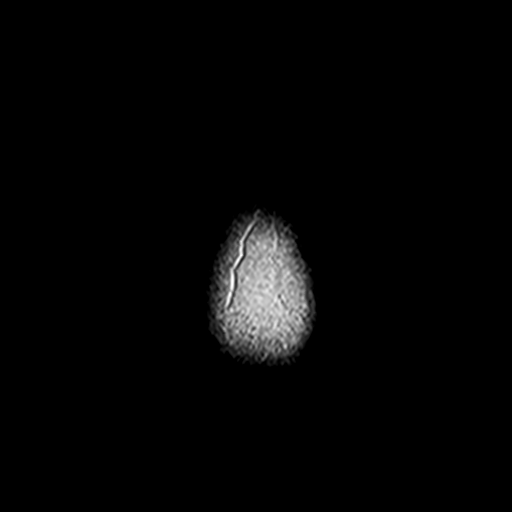

[Series 8: swi_images · axial · 3.0mm · 0.90mm/px · z∈[-84,+56]mm · 5 of 48 slices shown]
[im 1/48]
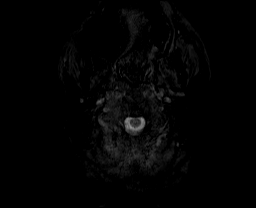
[im 12/48]
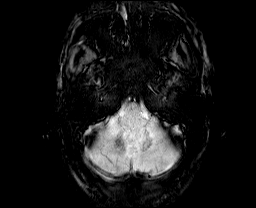
[im 24/48]
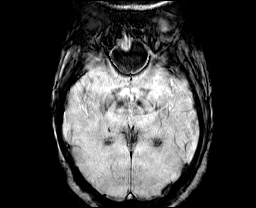
[im 36/48]
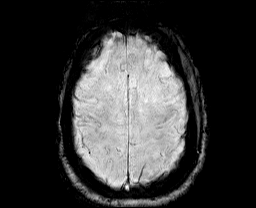
[im 48/48]
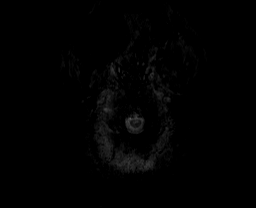

[Series 9: T1 · coronal · 3.0mm · 0.35mm/px · 1 of 11 slices shown (2 of 3)]
[im 1/11]
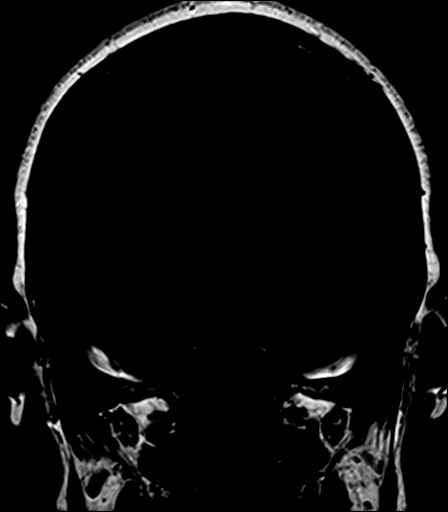

[Series 10: T2 · axial · 5.0mm · 0.45mm/px · z∈[-77,+65]mm · 2 of 23 slices shown (2 of 2)]
[im 1/23]
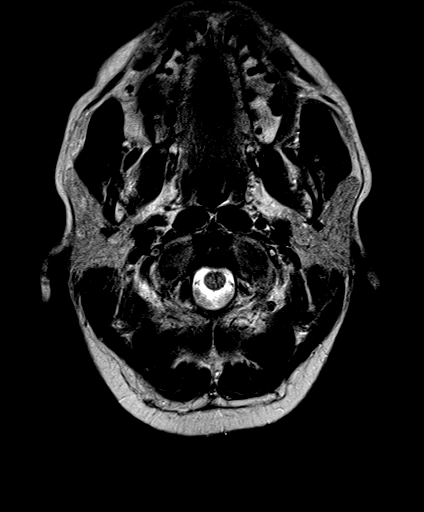
[im 23/23]
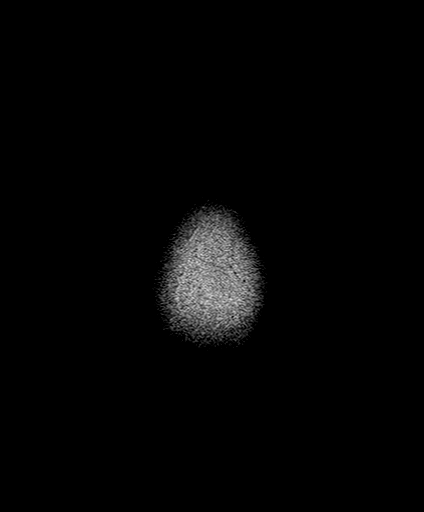

[Series 11: T1 · axial · 3.0mm · 0.35mm/px · 1 of 11 slices shown (3 of 3)]
[im 1/11]
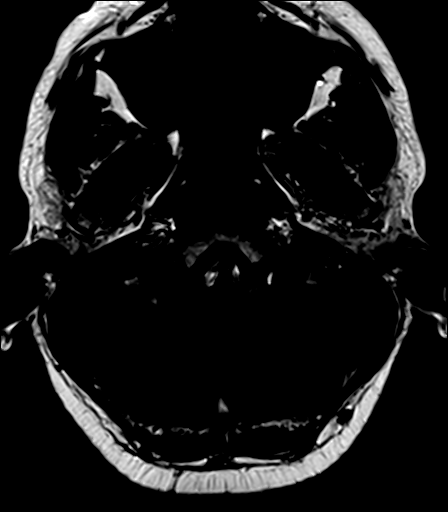

[Series 12: bSSFP · axial · 1.0mm · 0.28mm/px · z∈[-68,-45]mm · 3 of 36 slices shown]
[im 1/36]
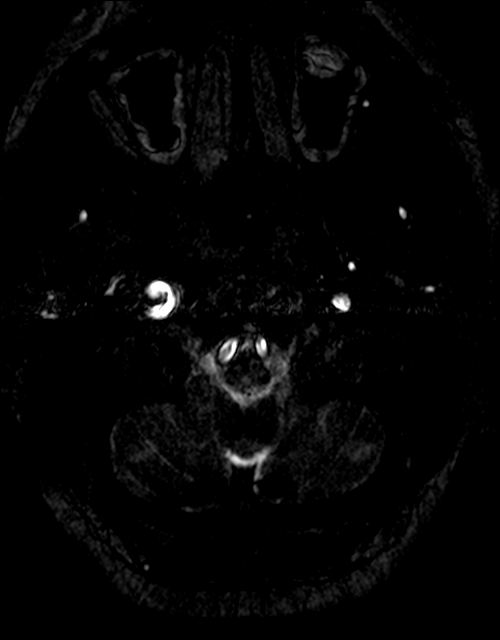
[im 12/36]
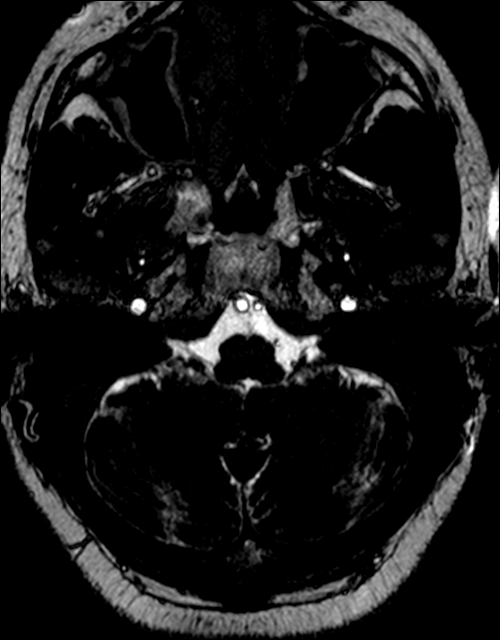
[im 24/36]
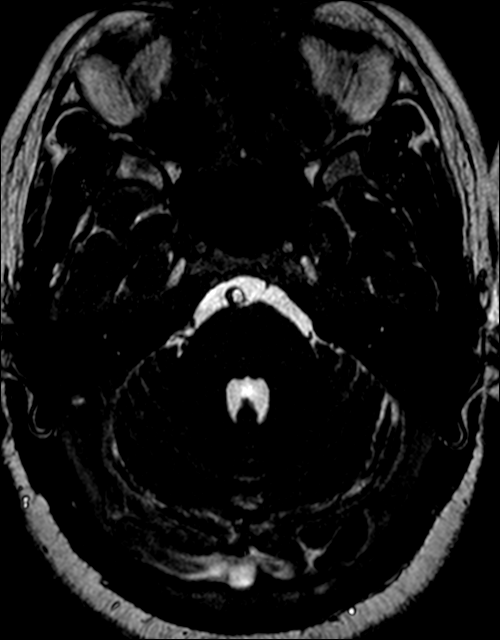

[Series 13: T1 post-contrast · coronal · 3.0mm · 0.35mm/px · 1 of 11 slices shown (1 of 2)]
[im 1/11]
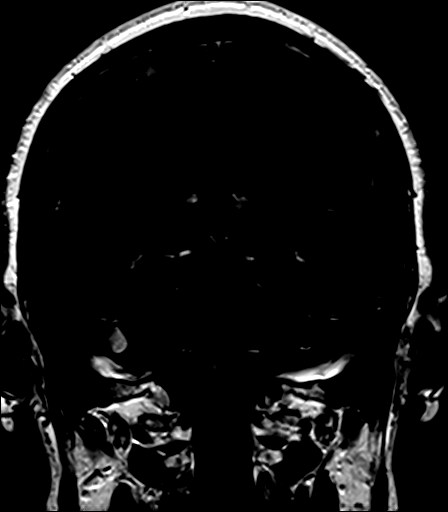

[Series 14: T1 post-contrast · axial · 3.0mm · 0.35mm/px · 1 of 11 slices shown (2 of 2)]
[im 1/11]
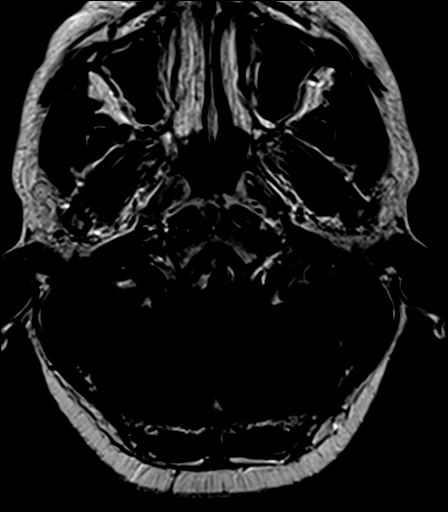

[36 of 48 positions shown; findings below may reference images not displayed]

FINDINGS: Brain: Stable 8 x 5 mm dural-based enhancing lesion along the right
mastoid and cerebellar convexity.

There is no abnormal enhancement within the internal auditory
canals. Inner ear structures demonstrate an unremarkable MR
appearance.

There is no intracranial mass, mass effect, or edema. There is no
hydrocephalus or extra-axial fluid collection. Ventricles and sulci
are normal in size and configuration. Patchy and mildly confluent
areas of T2 hyperintensity in the supratentorial white matter are
nonspecific but may reflect stable mild to moderate chronic
microvascular ischemic changes no abnormal parenchymal enhancement.

Vascular: Major vessel flow voids at the skull base are preserved.

Skull and upper cervical spine: Normal marrow signal is preserved.

Sinuses/Orbits: Diffuse paranasal sinus mucosal thickening. Orbits
are unremarkable.

Other: Sella is unremarkable.  Mastoid air cells are clear.
IMPRESSION: Stable subcentimeter right posterior fossa meningioma. There is no
schwannoma.

Stable probable chronic microvascular ischemic changes.

## 2020-10-04 MED ORDER — GADOBENATE DIMEGLUMINE 529 MG/ML IV SOLN
15.0000 mL | Freq: Once | INTRAVENOUS | Status: AC | PRN
  Administered 2020-10-04: 15 mL via INTRAVENOUS

## 2020-10-09 ENCOUNTER — Other Ambulatory Visit: Payer: Self-pay

## 2020-10-09 ENCOUNTER — Ambulatory Visit: Payer: Medicare Other | Attending: Otolaryngology | Admitting: Physical Therapy

## 2020-10-09 DIAGNOSIS — R42 Dizziness and giddiness: Secondary | ICD-10-CM | POA: Insufficient documentation

## 2020-10-09 DIAGNOSIS — M542 Cervicalgia: Secondary | ICD-10-CM | POA: Diagnosis not present

## 2020-10-09 DIAGNOSIS — R2681 Unsteadiness on feet: Secondary | ICD-10-CM | POA: Diagnosis not present

## 2020-10-09 DIAGNOSIS — R262 Difficulty in walking, not elsewhere classified: Secondary | ICD-10-CM | POA: Insufficient documentation

## 2020-10-09 NOTE — Therapy (Signed)
[facility] [address] [place], [place], [address] Phone: [phone]   Fax:  [phone]  Physical Therapy Evaluation  Patient Details  Name: Beverly Ramirez MRN: [id number] Date of Birth: 1960/11/21 Referring Provider (PT): Leta Baptist, MD   Encounter Date: 10/09/2020   PT End of Session - 10/09/20 1726    Visit Number 1    Number of Visits 7    Date for PT Re-Evaluation 11/23/20    Authorization Type Medicare; 10th visit PN    Progress Note Due on Visit 10    PT Start Time 1405    PT Stop Time 1449    PT Time Calculation (min) 44 min    Activity Tolerance Patient tolerated treatment well    Behavior During Therapy [facility] for tasks assessed/performed           Past Medical History:  Diagnosis Date  . Anemia   . Carpal tunnel syndrome    bilateral  . Chronic heel pain   . Diabetes mellitus without complication ([place])   . DJD (degenerative joint disease) of cervical spine    MRI 2017  . Hyperlipidemia   . Plantar fasciitis    bilateral  . Sickle cell anemia (HCC)    sickle cell trait  . Sleep apnea   . Tendonitis    left hand/wrist  . UC (ulcerative colitis) ([place])     Past Surgical History:  Procedure Laterality Date  . ABDOMINAL HYSTERECTOMY    . APPENDECTOMY    . CESAREAN SECTION     x4  . COLONOSCOPY     Multiple in [place]  . ESOPHAGOGASTRODUODENOSCOPY      There were no vitals filed for this visit.    Subjective Assessment - 10/09/20 1411    Subjective Pt diagnosed with R acoustic neuroma.  Reports she is dizzy most of the time, has headaches and has decreased hearing.  Also reports neck stiffness.  No falls.    Pertinent History ulcerative colitis, HLD, HTN, DM, De Quervain's tenosynovitis, bilat carpal tunnel, plantar fasciitis, fibromyalgia, sickle cell anemia, OSA    Limitations Standing;Walking;House hold activities    Patient Stated Goals To just stop spinning.    Currently in  Pain? Yes    Pain Location Head    Pain Descriptors / Indicators Headache    Pain Type Chronic pain    Pain Radiating Towards starts in the back and radiates up occiput              [facility] PT Assessment - 10/09/20 1414      Assessment   Medical Diagnosis Acoustic Neuroma    Referring Provider (PT) Leta Baptist, MD    Onset Date/Surgical Date 10/03/20    Prior Therapy yes for neck and back pain      Precautions   Precautions Other (comment)    Precaution Comments ulcerative colitis, HLD, HTN, DM, De Quervain's tenosynovitis, bilat carpal tunnel, plantar fasciitis, fibromyalgia, sickle cell anemia, OSA      Balance Screen   Has the patient fallen in the past 6 months No    Has the patient had a decrease in activity level because of a fear of falling?  Yes    Is the patient reluctant to leave their home because of a fear of falling?  Yes      [place] Private residence    Living Arrangements Spouse/significant other    Type of [place]  Home Access Stairs to enter    Additional Comments husband has been driving patient mostly; when pt is really dizzy she stays in her room or won't leave her house.  1-2x/week      Prior Function   Level of Independence Independent    Vocation Retired      Observation/Other Assessments   Focus on Therapeutic Outcomes (FOTO)  Dizziness Functional Status: 58; Dizziness Positional Status: 55.3      ROM / Strength   AROM / PROM / Strength AROM      AROM   AROM Assessment Site Cervical    Cervical Flexion 20    Cervical Extension 15    Cervical - Right Side Bend 25    Cervical - Left Side Bend 20    Cervical - Right Rotation 50    Cervical - Left Rotation 55                  Vestibular Assessment - 10/09/20 1419      Symptom Behavior   Subjective history of current problem Pt has significant nausea and intermittent vomiting; does have decreased hearing, intermittent tinnitus, HA and neck stiffness.     Type of Dizziness  Spinning    Frequency of Dizziness Daily    Duration of Dizziness constant    Symptom Nature Constant    Aggravating Factors Lying supine;Turning body quickly;Turning head quickly;Forward bending;Driving    Relieving Factors Slow movements    Progression of Symptoms No change since onset      Oculomotor Exam   Oculomotor Alignment Normal    Ocular ROM WFL    Spontaneous Comment    Gaze-induced  Comment   unable to fully assess, pt keeps eyes partially closed   Smooth Pursuits Intact    Saccades Intact      Oculomotor Exam-Fixation Suppressed    Left Head Impulse negative    Right Head Impulse positive      Vestibulo-Ocular Reflex   VOR to Slow Head Movement Normal    VOR Cancellation Normal      Visual Acuity   Static 7    Dynamic 5      Balancemaster   Balancemaster Comment MCTSIB: 30 seconds for all conditions but increased sway with compliant surface and vision removed      Positional Sensitivities   Nose to Right Knee No dizziness    Right Knee to Sitting No dizziness    Nose to Left Knee No dizziness    Left Knee to Sitting No dizziness    Head Turning x 5 Mild dizziness    Head Nodding x 5 Mild dizziness    Pivot Right in Standing --   off balance   Pivot Left in Standing --   off balance             Objective measurements completed on examination: See above findings.               PT Education - 10/09/20 1726    Education Details clinical findings, PT POC and goals    Person(s) Educated Patient    Methods Explanation    Comprehension Verbalized understanding            PT Short Term Goals - 10/09/20 1748      PT SHORT TERM GOAL #1   Title Pt will participate in further assessment of falls risk during dynamic gait with FGA    Time 3    Period Weeks  Status New    Target Date 10/30/20      PT SHORT TERM GOAL #2   Title Pt will initiate vestibular, balance and neck HEP    Time 3    Period Weeks    Status  New    Target Date 10/30/20             PT Long Term Goals - 10/09/20 1749      PT LONG TERM GOAL #1   Title Pt will report improvement on DFS to 59% and 5 point increase in DPS    Baseline DFS: 58%, DPS: 55.3    Time 6    Period Weeks    Status New    Target Date 11/23/20      PT LONG TERM GOAL #2   Title Pt will demonstrate independence with final vestibular, balance and neck HEP    Time 6    Period Weeks    Status New    Target Date 11/23/20      PT LONG TERM GOAL #3   Title Pt will demonstrate 20 deg increase in neck flexion/extension and 10 deg increase in lateral flexion and rotation bilaterally and will report 50% reduction in HA    Time 6    Period Weeks    Status New    Target Date 11/23/20      PT LONG TERM GOAL #4   Title Pt will report 75% reduction in dizziness with supine <> sit, quick head/body turns, bending forwards and when driving    Time 6    Period Weeks    Status New    Target Date 11/23/20      PT LONG TERM GOAL #5   Title Pt will demonstrate 4 point improvement in FGA to indicate decreased falls risk    Baseline TBD    Time 6    Period Weeks    Status New    Target Date 11/23/20                  Plan - 10/09/20 1727    Clinical Impression Statement Pt is a 60 year old female referred to Neuro OPPT for evaluation of dizziness due to R acoustic neuroma, recently diagnosed by ENT.  Pt's PMH is significant for the following: ulcerative colitis, HLD, HTN, DM, De Quervain's tenosynovitis, bilat carpal tunnel, plantar fasciitis, fibromyalgia, sickle cell anemia, OSA. 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  Pt would benefit from skilled PT to address these impairments and functional limitations to maximize functional mobility independence and reduce falls risk.    Personal Factors and  Comorbidities Comorbidity 3+;Past/Current Experience    Comorbidities ulcerative colitis, HLD, HTN, DM, De Quervain's tenosynovitis, bilat carpal tunnel, plantar fasciitis, fibromyalgia, sickle cell anemia, OSA    Examination-Activity Limitations Bed Mobility;Bend;Locomotion Level;Reach Overhead    Examination-Participation Restrictions Community Activity;Driving    Stability/Clinical Decision Making Evolving/Moderate complexity    Clinical Decision Making Moderate    Rehab Potential Good    PT Frequency 1x / week    PT Duration 6 weeks    PT Treatment/Interventions ADLs/Self Care Home Management;Canalith Repostioning;Cryotherapy;Moist Heat;Gait training;Stair training;Functional mobility training;Therapeutic activities;Therapeutic exercise;Balance training;Neuromuscular re-education;Patient/family education;Manual techniques;Passive range of motion;Dry needling;Vestibular;Taping    PT Next Visit Plan Assess FGA and reset LTG baseline.  Initiate HEP for hypofunction.  VOR, neck stretches and exercises, balance exercises, habituation.  Check for trigger points- discuss TDN.    Consulted and Agree with Plan of Care Patient           Patient will benefit from skilled therapeutic intervention in order to improve the following deficits and impairments:  Decreased balance,Decreased range of motion,Difficulty walking,Dizziness,Pain  Visit Diagnosis: Dizziness and giddiness  Cervicalgia  Unsteadiness on feet  Difficulty in walking, not elsewhere classified     Problem List Patient Active Problem List   Diagnosis Date Noted  . Type 2 diabetes mellitus with hyperglycemia, with long-term current use of insulin ([place]) 05/31/2020  . Fibromyalgia 05/31/2020  . Mouth ulcers 05/25/2020  . Elevated glucose 12/23/2019  . Diabetic ketoacidosis without coma associated with type 2 diabetes mellitus ([place]) 12/23/2019  . De Quervain's tenosynovitis, left 11/16/2018  . Bilateral carpal tunnel syndrome  04/13/2018  . Leukocytosis 10/04/2017  . Hyperlipidemia 08/04/2017  . Ulcerative colitis with complication ([place]) 21/22/4825  . Plantar fasciitis 05/28/2017    Rico Junker, PT, DPT 10/09/20    5:56 PM    [facility] [address] [place], [place], [address] Phone: [phone]   Fax:  [phone]  Name: Beverly Ramirez MRN: [id number] Date of Birth: [birth year]

## 2020-10-16 ENCOUNTER — Other Ambulatory Visit: Payer: Self-pay

## 2020-10-16 ENCOUNTER — Ambulatory Visit: Payer: Medicare Other | Admitting: Physical Therapy

## 2020-10-16 ENCOUNTER — Encounter: Payer: Self-pay | Admitting: Physical Therapy

## 2020-10-16 DIAGNOSIS — R262 Difficulty in walking, not elsewhere classified: Secondary | ICD-10-CM | POA: Diagnosis not present

## 2020-10-16 DIAGNOSIS — R2681 Unsteadiness on feet: Secondary | ICD-10-CM

## 2020-10-16 DIAGNOSIS — R42 Dizziness and giddiness: Secondary | ICD-10-CM | POA: Diagnosis not present

## 2020-10-16 DIAGNOSIS — M542 Cervicalgia: Secondary | ICD-10-CM | POA: Diagnosis not present

## 2020-10-16 NOTE — Patient Instructions (Addendum)
Gaze Stabilization - Tip Card  1.Target must remain in focus, not blurry, and appear stationary while head is in motion. 2.Perform exercises with small head movements (45 to either side of midline). 3.Increase speed of head motion so long as target is in focus. 4.If you wear eyeglasses, be sure you can see target through lens (therapist will give specific instructions for bifocal / progressive lenses). 5.These exercises may provoke dizziness or nausea. Work through these symptoms. If too dizzy, slow head movement slightly. Rest between each exercise. 6.Exercises demand concentration; avoid distractions. 7.For safety, perform standing exercises close to a counter, wall, corner, or next to someone.  Copyright  VHI. All rights reserved.   Gaze Stabilization - Standing Feet Apart   Feet shoulder width apart, keeping eyes on target on wall 3 feet away, tilt head down slightly and move head side to side for 30 seconds. Repeat while moving head up and down for 30 seconds.  Do 2-3 sessions per day.   Feet Together, Head Motion - Eyes Open    With eyes open, feet together, move head slowly: left and right 10 times.  Rest and then move head slowly up and down 10 times. Repeat 2 times per session. Do 2 sessions per day.   Feet Heel-Toe "Tandem", Head Motion - Eyes Closed    Place right foot slightly ahead of the left.  Close your eyes and maintain your balance for 10 seconds. Open eyes, switch feet, close eyes and hold your balance for 10 seconds Repeat _2___ times per session. Do _2___ sessions per day.

## 2020-10-17 NOTE — Therapy (Signed)
[facility] [address] [place], [place], [address] Phone: [phone]   Fax:  [phone]  Physical Therapy Treatment  Patient Details  Name: Beverly Ramirez MRN: [id number] Date of Birth: 19-Jan-1961 Referring Provider (PT): Leta Baptist, MD   Encounter Date: 10/16/2020   PT End of Session - 10/17/20 1515    Visit Number 2    Number of Visits 7    Date for PT Re-Evaluation 11/23/20    Authorization Type Medicare; 10th visit PN    Progress Note Due on Visit 10    PT Start Time 1451    PT Stop Time 1534    PT Time Calculation (min) 43 min    Activity Tolerance Patient tolerated treatment well    Behavior During Therapy [facility] for tasks assessed/performed           Past Medical History:  Diagnosis Date  . Anemia   . Carpal tunnel syndrome    bilateral  . Chronic heel pain   . Diabetes mellitus without complication ([place])   . DJD (degenerative joint disease) of cervical spine    MRI 2017  . Hyperlipidemia   . Plantar fasciitis    bilateral  . Sickle cell anemia (HCC)    sickle cell trait  . Sleep apnea   . Tendonitis    left hand/wrist  . UC (ulcerative colitis) ([place])     Past Surgical History:  Procedure Laterality Date  . ABDOMINAL HYSTERECTOMY    . APPENDECTOMY    . CESAREAN SECTION     x4  . COLONOSCOPY     Multiple in [place]  . ESOPHAGOGASTRODUODENOSCOPY      There were no vitals filed for this visit.   Subjective Assessment - 10/16/20 1455    Subjective Doing well, nothing new to report.  Dizziness is the same.  No HA today    Pertinent History ulcerative colitis, HLD, HTN, DM, De Quervain's tenosynovitis, bilat carpal tunnel, plantar fasciitis, fibromyalgia, sickle cell anemia, OSA    Limitations Standing;Walking;House hold activities    Patient Stated Goals To just stop spinning.    Currently in Pain? Yes              [facility] PT Assessment - 10/16/20 1457      Functional Gait   Assessment   Gait assessed  Yes    Gait Level Surface Walks 20 ft in less than 7 sec but greater than 5.5 sec, uses assistive device, slower speed, mild gait deviations, or deviates 6-10 in outside of the 12 in walkway width.    Change in Gait Speed Makes only minor adjustments to walking speed, or accomplishes a change in speed with significant gait deviations, deviates 10-15 in outside the 12 in walkway width, or changes speed but loses balance but is able to recover and continue walking.    Gait with Horizontal Head Turns Performs head turns smoothly with no change in gait. Deviates no more than 6 in outside 12 in walkway width    Gait with Vertical Head Turns Performs head turns with no change in gait. Deviates no more than 6 in outside 12 in walkway width.    Gait and Pivot Turn Pivot turns safely within 3 sec and stops quickly with no loss of balance.    Step Over Obstacle Is able to step over one shoe box (4.5 in total height) without changing gait speed. No evidence of imbalance.    Gait with Narrow Base of  Support Is able to ambulate for 10 steps heel to toe with no staggering.    Gait with Eyes Closed Walks 20 ft, no assistive devices, good speed, no evidence of imbalance, normal gait pattern, deviates no more than 6 in outside 12 in walkway width. Ambulates 20 ft in less than 7 sec.    Ambulating Backwards Walks 20 ft, uses assistive device, slower speed, mild gait deviations, deviates 6-10 in outside 12 in walkway width.    Steps Alternating feet, must use rail.    Total Score 24    FGA comment: 24/30; no dizziness                          Vestibular Treatment/Exercise - 10/16/20 1508      Vestibular Treatment/Exercise   Vestibular Treatment Provided Gaze    Gaze Exercises X1 Viewing Horizontal;X1 Viewing Vertical      X1 Viewing Horizontal   Foot Position standing feet apart    Reps 2    Comments 30 seconds; cues to make ROM of head turn slightly smaller;  mild-moderate dizziness after performing      X1 Viewing Vertical   Foot Position standing feet apart    Reps 2    Comments 30 seconds; cues to make ROM of head nod slightly smaller; mild-moderate dizziness after performing              Balance Exercises - 10/17/20 1531      Balance Exercises: Standing   Standing Eyes Opened Narrow base of support (BOS);Head turns;Solid surface;Other reps (comment)   feet together, 10 head turns/nods   Standing Eyes Closed Narrow base of support (BOS);Solid surface;10 secs;2 reps   staggered stance, hold balance            PT Education - 10/17/20 1514    Education Details vestibular HEP; symptom threshold and keeping symptoms in the mild range    Person(s) Educated Patient    Methods Explanation    Comprehension Verbalized understanding            PT Short Term Goals - 10/09/20 1748      PT SHORT TERM GOAL #1   Title Pt will participate in further assessment of falls risk during dynamic gait with FGA    Time 3    Period Weeks    Status New    Target Date 10/30/20      PT SHORT TERM GOAL #2   Title Pt will initiate vestibular, balance and neck HEP    Time 3    Period Weeks    Status New    Target Date 10/30/20             PT Long Term Goals - 10/17/20 1530      PT LONG TERM GOAL #1   Title Pt will report improvement on DFS to 59% and 5 point increase in DPS    Baseline DFS: 58%, DPS: 55.3    Time 6    Period Weeks    Status New    Target Date 11/23/20      PT LONG TERM GOAL #2   Title Pt will demonstrate independence with final vestibular, balance and neck HEP    Time 6    Period Weeks    Status New    Target Date 11/23/20      PT LONG TERM GOAL #3   Title Pt will demonstrate 20 deg increase in neck flexion/extension and  10 deg increase in lateral flexion and rotation bilaterally and will report 50% reduction in HA    Time 6    Period Weeks    Status New    Target Date 11/23/20      PT LONG TERM GOAL #4    Title Pt will report 75% reduction in dizziness with supine <> sit, quick head/body turns, bending forwards and when driving    Time 6    Period Weeks    Status New    Target Date 11/23/20      PT LONG TERM GOAL #5   Title Pt will demonstrate 4 point improvement in FGA to indicate decreased falls risk    Baseline 24/30    Time 6    Period Weeks    Status New    Target Date 11/23/20                 Plan - 10/17/20 1516    Clinical Impression Statement Performed assessment of falls risk during dynamic gait challenges; no dizziness and pt demonstrates low falls risk.  Educated pt on purpose of VOR and initiated HEP focusing on VOR training with x1 viewing and sensory integration with corner balance.  Pt reported mild dizziness with VOR and balance exercises.  Will continue to address and progress towards LTG.    Personal Factors and Comorbidities Comorbidity 3+;Past/Current Experience    Comorbidities ulcerative colitis, HLD, HTN, DM, De Quervain's tenosynovitis, bilat carpal tunnel, plantar fasciitis, fibromyalgia, sickle cell anemia, OSA    Examination-Activity Limitations Bed Mobility;Bend;Locomotion Level;Reach Overhead    Examination-Participation Restrictions Community Activity;Driving    Stability/Clinical Decision Making Evolving/Moderate complexity    Rehab Potential Good    PT Frequency 1x / week    PT Duration 6 weeks    PT Treatment/Interventions ADLs/Self Care Home Management;Canalith Repostioning;Cryotherapy;Moist Heat;Gait training;Stair training;Functional mobility training;Therapeutic activities;Therapeutic exercise;Balance training;Neuromuscular re-education;Patient/family education;Manual techniques;Passive range of motion;Dry needling;Vestibular;Taping    PT Next Visit Plan check and progress VOR, add neck stretches and exercises, balance exercises, habituation.  Check for trigger points- discuss TDN.    Consulted and Agree with Plan of Care Patient            Patient will benefit from skilled therapeutic intervention in order to improve the following deficits and impairments:  Decreased balance,Decreased range of motion,Difficulty walking,Dizziness,Pain  Visit Diagnosis: Dizziness and giddiness  Cervicalgia  Unsteadiness on feet  Difficulty in walking, not elsewhere classified     Problem List Patient Active Problem List   Diagnosis Date Noted  . Type 2 diabetes mellitus with hyperglycemia, with long-term current use of insulin ([place]) 05/31/2020  . Fibromyalgia 05/31/2020  . Mouth ulcers 05/25/2020  . Elevated glucose 12/23/2019  . Diabetic ketoacidosis without coma associated with type 2 diabetes mellitus ([place]) 12/23/2019  . De Quervain's tenosynovitis, left 11/16/2018  . Bilateral carpal tunnel syndrome 04/13/2018  . Leukocytosis 10/04/2017  . Hyperlipidemia 08/04/2017  . Ulcerative colitis with complication ([place]) 70/35/0093  . Plantar fasciitis 05/28/2017    Rico Junker, PT, DPT 10/17/20    3:32 PM    [facility] [address] [place], [place], [address] Phone: [phone]   Fax:  [phone]  Name: Beverly Ramirez MRN: [id number] Date of Birth: 13-Jun-1961

## 2020-10-18 ENCOUNTER — Ambulatory Visit (INDEPENDENT_AMBULATORY_CARE_PROVIDER_SITE_OTHER): Payer: Medicare Other | Admitting: Family Medicine

## 2020-10-18 ENCOUNTER — Encounter: Payer: Self-pay | Admitting: Family Medicine

## 2020-10-18 ENCOUNTER — Other Ambulatory Visit: Payer: Self-pay

## 2020-10-18 VITALS — BP 126/82 | HR 92 | Temp 98.2°F | Resp 15 | Ht 62.0 in | Wt 161.2 lb

## 2020-10-18 DIAGNOSIS — R7309 Other abnormal glucose: Secondary | ICD-10-CM

## 2020-10-18 DIAGNOSIS — M25512 Pain in left shoulder: Secondary | ICD-10-CM | POA: Diagnosis not present

## 2020-10-18 DIAGNOSIS — Z794 Long term (current) use of insulin: Secondary | ICD-10-CM

## 2020-10-18 DIAGNOSIS — E1165 Type 2 diabetes mellitus with hyperglycemia: Secondary | ICD-10-CM | POA: Diagnosis not present

## 2020-10-18 DIAGNOSIS — K5100 Ulcerative (chronic) pancolitis without complications: Secondary | ICD-10-CM | POA: Diagnosis not present

## 2020-10-18 DIAGNOSIS — E162 Hypoglycemia, unspecified: Secondary | ICD-10-CM

## 2020-10-18 DIAGNOSIS — R519 Headache, unspecified: Secondary | ICD-10-CM

## 2020-10-18 DIAGNOSIS — M62830 Muscle spasm of back: Secondary | ICD-10-CM

## 2020-10-18 LAB — GLUCOSE, POCT (MANUAL RESULT ENTRY): POC Glucose: 132 mg/dl — AB (ref 70–99)

## 2020-10-18 MED ORDER — DICYCLOMINE HCL 20 MG PO TABS: 20.0000 mg | ORAL_TABLET | Freq: Three times a day (TID) | ORAL | 0 refills | Status: DC | PRN

## 2020-10-18 NOTE — Patient Instructions (Addendum)
Try taking dicyclomine (I sent in refill), but contact Dr. Celesta Aver office today about your current flair and plan, as well as to set up appointment as it appears he wanted to see you in January.  Return to the clinic or go to the nearest emergency room if any of your symptoms worsen or new symptoms occur.  Starting cpap may help headaches as well as fatigue and blood pressure.  Keep follow up with Dr. Rexene Alberts as well for headaches.  Here is number for sleep specialist to coordinate getting the machine. It appears prior company was [employer], but they may need updated information from your sleep specialist: [place] at [facility] Phone: [phone]  Keep follow-up with your endocrinologist, make sure not to skip meals to lessen risk of low blood sugars. Make sure to eat as soon as you leave our office. If any further lows today - be seen in ER or urgent care.  Make sure to let your endocrinologist know if you continue to have any further low blood sugars.Return to the clinic or go to the nearest emergency room if any of your symptoms worsen or new symptoms occur.  Shoulder pain could be due to spasm at the neck or possible shoulder issue.  Try Robaxin few times per day into the next 1 week.  If symptoms are not improving, please schedule another visit and we can look at that further or can refer you to orthopedist.  Follow-up sooner if worse.   If you have lab work done today you will be contacted with your lab results within the next 2 weeks.  If you have not heard from [place] then please contact us. The fastest way to get your results is to register for My Chart.   IF you received an x-ray today, you will receive an invoice from [facility]. Please contact [facility] at [phone] with questions or concerns regarding your invoice.   IF you received labwork today, you will receive an invoice from [place]. Please contact [facility] at  [phone] with questions or concerns regarding your invoice.   Our billing staff will not be able to assist you with questions regarding bills from these companies.  You will be contacted with the lab results as soon as they are available. The fastest way to get your results is to activate your My Chart account. Instructions are located on the last page of this paperwork. If you have not heard from [place] regarding the results in 2 weeks, please contact this office.

## 2020-10-18 NOTE — Progress Notes (Addendum)
Subjective:  Patient ID: Beverly Ramirez, female    DOB: 1961-01-19  Age: 60 y.o. MRN: [id number]  CC:  Chief Complaint  Patient presents with  . Establish Care    Pt doing well today does want to follow up on testing that was ordered.   Marland Kitchen Ulcerative Colitis    Pt reports flare starting about 2 days ago. Pt reports loose stools and nausea otherwise okay. Pt reports usually takes prednisone for flares   . Diabetes    Pt reports fasting BG 188 this morning and requested we check her BG while she is in office. ordered    HPI Beverly Ramirez presents for   Follow-up.  Last visit January 4.  Previously patient of Dr. Pamella Pert.  History of ulcerative colitis, hyperlipidemia, hypertension, diabetes, fibromyalgia.  Ulcerative colitis: Gastroenterologist Dr. Carlean Purl.  Last appointment reviewed from October 2021. Continued on mesalamine at that time, fecal calprotectin improved in November ([phone]).  Dicyclomine if needed for stomach pain.  Plan for follow-up in January. Has not had recent appt - will need to schedule.  Had more nausea and diarrhea starting 2 days ago. No blood noted. 3 episodes yesterday. No BM yet today. No vomiting, no fever.  Trying liquid diet. Some cramps in stomach, but ok in between cramps.  Continues to take mesalamine 2.4gBID.  Ran out of diciclomine - none during current flair.   Headache Discussed last visit.  History of meningioma, repeat MRI ordered but recommended she follow-up with her neurologist to discuss headaches.  Referral placed.  Appointment scheduled for February 21 with Dr. Rexene Alberts.  MRI brain on January 27 indicated previous right posterior fossa meningioma was a stable, subcentimeter.  No schwannoma.  Stable probable chronic microvascular ischemic changes. No change in headaches since last visit. No worsening.  Diagnosed with OSA last year - plan for autopap. Called by medical supply few months ago for machine, but never had picked up. Has not  called sleep specialist to coordinate equipment.   Diabetes: With hyperglycemia and hypoglycemia.  Followed by Dr. Kelton Pillar - appt in 8 days. On farxiga, novolog with meals and basaglar as basal. Also followed by nutritionist.  bs 188 fasting this am - felt tired. Better now. Has been tired for awhile. Symptomatic low few days ago? Feeling fine, but reading of 71. Reading of 58 10/02/20. Not sure if skipped meal. Not usually skipping meals.    Results for orders placed or performed in visit on 10/18/20  POCT glucose (manual entry)  Result Value Ref Range   POC Glucose 132 (A) 70 - 99 mg/dl     Lab Results  Component Value Date   HGBA1C 7.2 (A) 07/23/2020   HGBA1C 7.1 (H) 04/20/2020   HGBA1C 8.0 (H) 03/02/2020   Lab Results  Component Value Date   LDLCALC 179 (H) 02/02/2020   CREATININE 0.85 09/11/2020    Left shoulder/arm/side pain: Noted from patient at end of visit. States this has been off and on the past week. No fall, no injury.  Upper left shoulder, top of left arm, sometimes to axilla/shoulder blade.  No recent change in activities.  Has robaxin at home- took one few days ago to sleep - helped some.    History Patient Active Problem List   Diagnosis Date Noted  . Type 2 diabetes mellitus with hyperglycemia, with long-term current use of insulin ([place]) 05/31/2020  . Fibromyalgia 05/31/2020  . Mouth ulcers 05/25/2020  . Elevated glucose 12/23/2019  . Diabetic ketoacidosis without coma  associated with type 2 diabetes mellitus ([place]) 12/23/2019  . De Quervain's tenosynovitis, left 11/16/2018  . Bilateral carpal tunnel syndrome 04/13/2018  . Leukocytosis 10/04/2017  . Hyperlipidemia 08/04/2017  . Ulcerative colitis with complication ([place]) 67/89/3810  . Plantar fasciitis 05/28/2017   Past Medical History:  Diagnosis Date  . Anemia   . Carpal tunnel syndrome    bilateral  . Chronic heel pain   . Diabetes mellitus without complication ([place])   . DJD  (degenerative joint disease) of cervical spine    MRI 2017  . Hyperlipidemia   . Plantar fasciitis    bilateral  . Sickle cell anemia (HCC)    sickle cell trait  . Sleep apnea   . Tendonitis    left hand/wrist  . UC (ulcerative colitis) ([place])    Past Surgical History:  Procedure Laterality Date  . ABDOMINAL HYSTERECTOMY    . APPENDECTOMY    . CESAREAN SECTION     x4  . COLONOSCOPY     Multiple in [place]  . ESOPHAGOGASTRODUODENOSCOPY     Allergies  Allergen Reactions  . Codeine Itching, Rash and Hives  . Latex Hives    With the power   Prior to Admission medications   Medication Sig Start Date End Date Taking? Authorizing Provider  albuterol (VENTOLIN HFA) 108 (90 Base) MCG/ACT inhaler Inhale 2 puffs into the lungs every 6 (six) hours as needed for wheezing or shortness of breath. 03/13/20  Yes Jacelyn Pi, Lilia Argue, MD  amLODipine (NORVASC) 5 MG tablet TAKE 1 TABLET BY MOUTH EVERY DAY 06/08/20  Yes Jacelyn Pi, Lilia Argue, MD  atorvastatin (LIPITOR) 40 MG tablet Take 1 tablet (40 mg total) by mouth daily. 02/10/20  Yes Jacelyn Pi, Lilia Argue, MD  dapagliflozin propanediol (FARXIGA) 5 MG TABS tablet Take 1 tablet (5 mg total) by mouth daily before breakfast. 07/23/20  Yes Shamleffer, Melanie Crazier, MD  dicyclomine (BENTYL) 20 MG tablet Take 1 tablet (20 mg total) by mouth 3 (three) times daily as needed for spasms (abdominal cramping). 05/08/20  Yes Long, Wonda Olds, MD  diphenoxylate-atropine (LOMOTIL) 2.5-0.025 MG tablet Take 1 tablet by mouth 4 (four) times daily as needed for diarrhea or loose stools. 04/27/20  Yes Gatha Mayer, MD  DULoxetine (CYMBALTA) 60 MG capsule TAKE 1 CAPSULE BY MOUTH EVERY DAY 03/20/20  Yes Jacelyn Pi, Lilia Argue, MD  esomeprazole (NEXIUM) 40 MG capsule TAKE 1 CAPSULE BY MOUTH EVERY DAY BEFORE BREAKFAST 07/20/20  Yes Gatha Mayer, MD  fluticasone [facility]) 50 MCG/ACT nasal spray SPRAY 2 SPRAYS INTO EACH NOSTRIL EVERY DAY Patient taking differently:  Patient takes as needed 03/08/20  Yes Jacelyn Pi, Irma M, MD  glucose blood [facility] VERIO) test strip Check blood glucose 3-4 x day. Dx E11.9, E11.649, z79.4, 05/04/20  Yes Jacelyn Pi, Lilia Argue, MD  insulin aspart (NOVOLOG FLEXPEN) 100 UNIT/ML FlexPen Inject 22 Units into the skin 3 (three) times daily with meals. 07/23/20  Yes Shamleffer, Melanie Crazier, MD  Insulin Glargine (BASAGLAR KWIKPEN) 100 UNIT/ML Inject 32 Units into the skin at bedtime. 07/23/20  Yes Shamleffer, Melanie Crazier, MD  Insulin Pen Needle 32G X 4 MM MISC 1 Device by Does not apply route in the morning, at noon, in the evening, and at bedtime. 07/23/20  Yes Shamleffer, Melanie Crazier, MD  Lancets [facility] DELICA PLUS [id number]) [facility] 1 each by Does not apply route 3 (three) times daily. Dx E11.65, z79.4 01/24/19  Yes Jacelyn Pi, Lilia Argue, MD  lidocaine (  XYLOCAINE) 2 % solution Use as directed 15 mLs in the mouth or throat as needed for mouth pain. 03/09/20  Yes Gatha Mayer, MD  mesalamine (LIALDA) 1.2 g EC tablet Take 2 tablets (2.4 g total) by mouth 2 (two) times daily. 04/28/20 04/23/21 Yes Gatha Mayer, MD  methocarbamol (ROBAXIN) 500 MG tablet Take 1 tablet (500 mg total) by mouth every 8 (eight) hours as needed for muscle spasms. 05/31/20  Yes Jacelyn Pi, Irma M, MD  montelukast (SINGULAIR) 10 MG tablet TAKE 1 TABLET BY MOUTH EVERYDAY AT BEDTIME 05/27/20  Yes Maximiano Coss, NP  Multiple Vitamins-Minerals (WOMENS 50+ [place] VITAMIN/MIN PO) Take by mouth.   Yes [provider]  NONFORMULARY OR COMPOUNDED ITEM Swish and spit 5 mLs as needed (ulcer flare up in mouth). Magic mouthwash with lidocaine disp#150 ml 05/04/20  Yes Gatha Mayer, MD  ondansetron (ZOFRAN ODT) 4 MG disintegrating tablet Take 1 tablet (4 mg total) by mouth every 8 (eight) hours as needed for nausea or vomiting. 05/08/20  Yes Long, Wonda Olds, MD  triamcinolone cream (KENALOG) 0.1 % Apply 1 application topically 2 (two) times daily. 04/19/20   Yes Jacelyn Pi, Lilia Argue, MD   Social History   Socioeconomic History  . Marital status: Married    Spouse name: Simona Huh  . Number of children: 4  . Years of education: Not on file  . Highest education level: Not on file  Occupational History  . Occupation: retired    Comment: He formerly worked for [employer], disabled due to a fall  Tobacco Use  . Smoking status: Former Smoker    Types: Cigarettes    Quit date: 10/13/2006    Years since quitting: 14.0  . Smokeless tobacco: Never Used  Vaping Use  . Vaping Use: Never used  Substance and Sexual Activity  . Alcohol use: Yes    Comment: occasional  . Drug use: No  . Sexual activity: Yes    Partners: Male    Comment: 1ST intercourse- 38, partners- 44, married- 23 yrs   Other Topics Concern  . Not on file  Social History Narrative   She is married, 3 sons born 1979, 1981, 1984.  One daughter born in 29.   She is medically retired from the [employer] after a fall and a hip injury, around 2004.   Caffeinated beverage every other day x1   07/13/2017   Social Determinants of Health   Financial Resource Strain: Not on file  Food Insecurity: Not on file  Transportation Needs: Not on file  Physical Activity: Not on file  Stress: Not on file  Social Connections: Not on file  Intimate Partner Violence: Not on file    Review of Systems   Objective:   Vitals:   10/18/20 1055 10/18/20 1057  BP: (!) 149/85 126/82  Pulse: 92   Resp: 15   Temp: 98.2 F (36.8 C)   TempSrc: Temporal   SpO2: 98%   Weight: 161 lb 3.2 oz (73.1 kg)   Height: 5' 2"  (1.575 m)      Physical Exam Vitals reviewed.  Constitutional:      Appearance: She is well-developed and well-nourished.  HENT:     Head: Normocephalic and atraumatic.  Eyes:     Extraocular Movements: EOM normal.     Conjunctiva/sclera: Conjunctivae normal.     Pupils: Pupils are equal, round, and reactive to light.  Neck:  Vascular: No carotid bruit.  Cardiovascular:     Rate and Rhythm: Normal rate and regular rhythm.     Pulses: Intact distal pulses.     Heart sounds: Normal heart sounds.  Pulmonary:     Effort: Pulmonary effort is normal.     Breath sounds: Normal breath sounds.  Abdominal:     Palpations: Abdomen is soft. There is no pulsatile mass.     Tenderness: There is abdominal tenderness (min lower abdomen, no distension. ).  Musculoskeletal:     Comments: C-spine no midline bony tenderness.  Slight decreased range of motion with slight discomfort into her left upper trapezius/paraspinals on the left with right lateral flexion.  Minimal spasm of upper trapezius, slight tenderness over the dorsal shoulder, lateral deltoid, no focal bony tenderness.  No [place]/AC or clavicle tenderness.guarded abduction, flexion with discomfort within shoulder.  Neurovascular intact distally.  Reflexes equal, some difficulty with triceps bilaterally.  Biceps and brachioradialis equal to 2+.  Skin:    General: Skin is warm and dry.  Neurological:     Mental Status: She is alert and oriented to person, place, and time.  Psychiatric:        Mood and Affect: Mood and affect and mood normal.        Behavior: Behavior normal.    48 minutes spent during visit, greater than 50% counseling and assimilation of information, chart review, and discussion of plan.     Assessment & Plan:  Brigid Ferrin-Hines is a 60 y.o. female . Ulcerative pancolitis without complication ([place]) - Plan: dicyclomine (BENTYL) 20 MG tablet  -Likely current flare.  Recommended trying dicyclomine as previously prescribed but to call her gastroenterologist office today for further advice on current flare.  Also due for follow-up with gastroenterology per last notes.  Nonintractable episodic headache, unspecified headache type  -Persistent headache, may be multifactorial.  Has headache specialist/neuro follow-up planned soon, recent MRI reassuring.   Stressed importance of start of CPAP anxiety multiple symptoms including headaches.  Type 2 diabetes mellitus with hyperglycemia, with long-term current use of insulin (HCC) Elevated glucose level - Plan: POCT glucose (manual entry)  -With hyper and hypoglycemia.  Stressed importance of regular meals to minimize risk of hypoglycemia, keep follow-up with endocrinologist as planned.  RTC/ER precautions.   Acute pain of left shoulder Paraspinal muscle spasm  -Possible mixed picture of cervical radiculopathy, cervical source with spasm, but also some guarding with range of motion and with history of diabetes differential includes adhesive capsulitis.  No known injury.  Hold on imaging for now.  Trial of her Robaxin initially, then if not improving next week follow-up for orthopedic eval.  RTC/ER precautions if worsening   12:36 PM Addendum Patient ready for discharge - waiting on paperwork and stepped out at 1230- not feeling well - she checked her blood sugar and was 67.  She took once glucose tab (15grams per 4 tabs). Glucose 59 on our meter. 15 gram glucose gel given.  Alert and responsive. Continued monitoring planned. She ate regular breakfast. Usually eats lunch at this time.   Results for orders placed or performed in visit on 10/18/20  POCT glucose (manual entry)  Result Value Ref Range   POC Glucose 132 (A) 70 - 99 mg/dl      Meds ordered this encounter  Medications  . dicyclomine (BENTYL) 20 MG tablet    Sig: Take 1 tablet (20 mg total) by mouth 3 (three) times daily as needed for spasms (abdominal cramping).  Dispense:  20 tablet    Refill:  0   Patient Instructions   Try taking dicyclomine (I sent in refill), but contact Dr. Celesta Aver office today about your current flair and plan, as well as to set up appointment as it appears he wanted to see you in January.  Return to the clinic or go to the nearest emergency room if any of your symptoms worsen or new symptoms  occur.  Starting cpap may help headaches as well as fatigue and blood pressure.  Keep follow up with Dr. Rexene Alberts as well for headaches.  Here is number for sleep specialist to coordinate getting the machine. It appears prior company was [employer], but they may need updated information from your sleep specialist: [place] at [facility] Phone: [phone]  Keep follow-up with your endocrinologist, make sure not to skip meals to lessen risk of low blood sugars.  Make sure to let your endocrinologist know if you continue to have any further low blood sugars.Return to the clinic or go to the nearest emergency room if any of your symptoms worsen or new symptoms occur.  Shoulder pain could be due to spasm at the neck or possible shoulder issue.  Try Robaxin few times per day into the next 1 week.  If symptoms are not improving, please schedule another visit and we can look at that further or can refer you to orthopedist.  Follow-up sooner if worse.   If you have lab work done today you will be contacted with your lab results within the next 2 weeks.  If you have not heard from [place] then please contact us. The fastest way to get your results is to register for My Chart.   IF you received an x-ray today, you will receive an invoice from [facility]. Please contact [facility] at [phone] with questions or concerns regarding your invoice.   IF you received labwork today, you will receive an invoice from [place]. Please contact [facility] at [phone] with questions or concerns regarding your invoice.   Our billing staff will not be able to assist you with questions regarding bills from these companies.  You will be contacted with the lab results as soon as they are available. The fastest way to get your results is to activate your My Chart account. Instructions are located on the last page of this paperwork. If you have not heard from [place]  regarding the results in 2 weeks, please contact this office.         Signed, Merri Ray, MD [facility]

## 2020-10-19 ENCOUNTER — Ambulatory Visit: Payer: Medicare Other | Admitting: Family Medicine

## 2020-10-22 DIAGNOSIS — H832X1 Labyrinthine dysfunction, right ear: Secondary | ICD-10-CM | POA: Diagnosis not present

## 2020-10-22 DIAGNOSIS — R42 Dizziness and giddiness: Secondary | ICD-10-CM | POA: Diagnosis not present

## 2020-10-22 DIAGNOSIS — H903 Sensorineural hearing loss, bilateral: Secondary | ICD-10-CM | POA: Diagnosis not present

## 2020-10-23 ENCOUNTER — Telehealth: Payer: Self-pay | Admitting: Neurology

## 2020-10-23 ENCOUNTER — Other Ambulatory Visit: Payer: Self-pay

## 2020-10-23 ENCOUNTER — Ambulatory Visit: Payer: Medicare Other | Admitting: Physical Therapy

## 2020-10-23 ENCOUNTER — Encounter: Payer: Self-pay | Admitting: Physical Therapy

## 2020-10-23 DIAGNOSIS — M542 Cervicalgia: Secondary | ICD-10-CM

## 2020-10-23 DIAGNOSIS — R262 Difficulty in walking, not elsewhere classified: Secondary | ICD-10-CM

## 2020-10-23 DIAGNOSIS — R2681 Unsteadiness on feet: Secondary | ICD-10-CM | POA: Diagnosis not present

## 2020-10-23 DIAGNOSIS — R42 Dizziness and giddiness: Secondary | ICD-10-CM | POA: Diagnosis not present

## 2020-10-23 NOTE — Therapy (Signed)
[facility] [address] [place], [place], [address] Phone: [phone]   Fax:  [phone]  Physical Therapy Treatment  Patient Details  Name: Beverly Ramirez MRN: [id number] Date of Birth: [birth year] Referring Provider (PT): Leta Baptist, MD   Encounter Date: 10/23/2020   PT End of Session - 10/23/20 2111    Visit Number 3    Number of Visits 7    Date for PT Re-Evaluation 11/23/20    Authorization Type Medicare; 10th visit PN    Progress Note Due on Visit 10    PT Start Time 1500    PT Stop Time 1538    PT Time Calculation (min) 38 min    Activity Tolerance Patient tolerated treatment well    Behavior During Therapy [facility] for tasks assessed/performed           Past Medical History:  Diagnosis Date  . Anemia   . Carpal tunnel syndrome    bilateral  . Chronic heel pain   . Diabetes mellitus without complication ([place])   . DJD (degenerative joint disease) of cervical spine    MRI 2017  . Hyperlipidemia   . Plantar fasciitis    bilateral  . Sickle cell anemia (HCC)    sickle cell trait  . Sleep apnea   . Tendonitis    left hand/wrist  . UC (ulcerative colitis) ([place])     Past Surgical History:  Procedure Laterality Date  . ABDOMINAL HYSTERECTOMY    . APPENDECTOMY    . CESAREAN SECTION     x4  . COLONOSCOPY     Multiple in [place]  . ESOPHAGOGASTRODUODENOSCOPY      There were no vitals filed for this visit.   Subjective Assessment - 10/23/20 1503    Subjective Feeling about the same, exercises are going well.  Woke up with HA today.  Saw ENT yesterday who explained more about her tumor; found out she has OSA - will have to start using CPAP.    Pertinent History ulcerative colitis, HLD, HTN, DM, De Quervain's tenosynovitis, bilat carpal tunnel, plantar fasciitis, fibromyalgia, sickle cell anemia, OSA    Limitations Standing;Walking;House hold activities    Patient Stated Goals To just stop  spinning.    Currently in Pain? Yes                             [facility] PT Treatment/Exercise - 10/23/20 2105      Therapeutic Activites    Therapeutic Activities Other Therapeutic Activities    Other Therapeutic Activities Discussed with pt how OSA could be contributing to her HA and dizziness; assisted pt with problem solving how to get the number for CPAP provider; pt did not realize neurology office was in the same building at PT clinic.  Recommended pt go next door and request assistance with getting CPAP so that she can start wearing ASAP.           Vestibular Treatment/Exercise - 10/23/20 2107      Vestibular Treatment/Exercise   Vestibular Treatment Provided Gaze    Gaze Exercises X1 Viewing Horizontal;X1 Viewing Vertical      X1 Viewing Horizontal   Foot Position feet apart and then feet together but pt too imbalanced to perform with feet together; returned to feet apart    Reps 2    Comments progressed to 60 seconds      X1 Viewing Vertical   Foot  Position feet apart and then feet together but pt too imbalanced to perform with feet together; returned to feet apart    Reps 2    Comments progressed to 60 seconds              Balance Exercises - 10/23/20 1537      Balance Exercises: Standing   Standing Eyes Opened Narrow base of support (BOS);Head turns;Solid surface;Other reps (comment)   10 reps, no symptoms   Standing Eyes Closed Narrow base of support (BOS);Foam/compliant surface;Solid surface;Other reps (comment);Limitations    Standing Eyes Closed Limitations progressed to feet together with EC and head turns/nods-no symptoms and no imbalance.  Added pillow under patient's feet and repeated head turns/nods x 10 reps each with EC with mild-moderate sway.  Provided to pt for HEP    Tandem Gait Forward;Limitations   added to HEP   Turning Right;Left;3 reps;Other (comment)   walking with 180 turns L and R x 3, no dizziness            PT  Education - 10/23/20 2110    Education Details how to contact neurology about CPAP machine; updated HEP    Person(s) Educated Patient    Methods Explanation;Demonstration;Handout    Comprehension Verbalized understanding;Returned demonstration          Gaze Stabilization - Tip Card  1.Target must remain in focus, not blurry, and appear stationary while head is in motion. 2.Perform exercises with small head movements (45 to either side of midline). 3.Increase speed of head motion so long as target is in focus. 4.If you wear eyeglasses, be sure you can see target through lens (therapist will give specific instructions for bifocal / progressive lenses). 5.These exercises may provoke dizziness or nausea. Work through these symptoms. If too dizzy, slow head movement slightly. Rest between each exercise. 6.Exercises demand concentration; avoid distractions. 7.For safety, perform standing exercises close to a counter, wall, corner, or next to someone.  Copyright  VHI. All rights reserved.   Gaze Stabilization - Standing Feet Apart   Feet shoulder width apart, keeping eyes on target on wall 3 feet away, tilt head down slightly and move head side to side for 60 seconds. Repeat while moving head up and down for 60 seconds.  Do 2-3 sessions per day.    Feet Together (Compliant Surface) Head Motion - Eyes Closed    Stand on compliant surface: Pillow with feet close together.  Have a counter top/wall/couch behind you and a chair in front for support. Close eyes and move head slowly, up and down 10 times.  Regain your balance and then perform head left and right, slowly 10 times. Repeat 2 times per session. Do 2 sessions per day.   Feet Heel-Toe "Tandem", Head Motion - Eyes Closed    Place right foot slightly ahead of the left.  Close your eyes and maintain your balance for 10 seconds. Open eyes, switch feet, close eyes and hold your balance for 10 seconds Repeat _2___ times per  session. Do _2___ sessions per day.     Feet Heel-Toe "Tandem"    Arms outstretched, walk a straight line bringing one foot directly in front of the other. Repeat for 4 laps in hallway per session. Do __2__ sessions per day.  Copyright  VHI. All rights reserved.           PT Short Term Goals - 10/23/20 2115      PT SHORT TERM GOAL #1   Title Pt will  participate in further assessment of falls risk during dynamic gait with FGA    Time 3    Period Weeks    Status Achieved    Target Date 10/30/20      PT SHORT TERM GOAL #2   Title Pt will initiate vestibular, balance and neck HEP    Time 3    Period Weeks    Status Achieved    Target Date 10/30/20             PT Long Term Goals - 10/17/20 1530      PT LONG TERM GOAL #1   Title Pt will report improvement on DFS to 59% and 5 point increase in DPS    Baseline DFS: 58%, DPS: 55.3    Time 6    Period Weeks    Status New    Target Date 11/23/20      PT LONG TERM GOAL #2   Title Pt will demonstrate independence with final vestibular, balance and neck HEP    Time 6    Period Weeks    Status New    Target Date 11/23/20      PT LONG TERM GOAL #3   Title Pt will demonstrate 20 deg increase in neck flexion/extension and 10 deg increase in lateral flexion and rotation bilaterally and will report 50% reduction in HA    Time 6    Period Weeks    Status New    Target Date 11/23/20      PT LONG TERM GOAL #4   Title Pt will report 75% reduction in dizziness with supine <> sit, quick head/body turns, bending forwards and when driving    Time 6    Period Weeks    Status New    Target Date 11/23/20      PT LONG TERM GOAL #5   Title Pt will demonstrate 4 point improvement in FGA to indicate decreased falls risk    Baseline 24/30    Time 6    Period Weeks    Status New    Target Date 11/23/20                 Plan - 10/23/20 2112    Clinical Impression Statement Pt to initiate use of CPAP which may  reduce some symptoms of dizziness; focused treatment session on progression of VOR x1, sensory integration and balance training.  Pt continues to report little to no symptoms of dizziness; only mild imbalance.  Will continue to address and progress towards LTG.    Personal Factors and Comorbidities Comorbidity 3+;Past/Current Experience    Comorbidities ulcerative colitis, HLD, HTN, DM, De Quervain's tenosynovitis, bilat carpal tunnel, plantar fasciitis, fibromyalgia, sickle cell anemia, OSA    Examination-Activity Limitations Bed Mobility;Bend;Locomotion Level;Reach Overhead    Examination-Participation Restrictions Community Activity;Driving    Stability/Clinical Decision Making Evolving/Moderate complexity    Rehab Potential Good    PT Frequency 1x / week    PT Duration 6 weeks    PT Treatment/Interventions ADLs/Self Care Home Management;Canalith Repostioning;Cryotherapy;Moist Heat;Gait training;Stair training;Functional mobility training;Therapeutic activities;Therapeutic exercise;Balance training;Neuromuscular re-education;Patient/family education;Manual techniques;Passive range of motion;Dry needling;Vestibular;Taping    PT Next Visit Plan Was she able to get CPAP; progress VOR, add neck stretches and exercises, balance exercises especially with tandem gait or compliant surface    Consulted and Agree with Plan of Care Patient           Patient will benefit from skilled therapeutic intervention in order to improve the  following deficits and impairments:  Decreased balance,Decreased range of motion,Difficulty walking,Dizziness,Pain  Visit Diagnosis: Dizziness and giddiness  Cervicalgia  Unsteadiness on feet  Difficulty in walking, not elsewhere classified     Problem List Patient Active Problem List   Diagnosis Date Noted  . Type 2 diabetes mellitus with hyperglycemia, with long-term current use of insulin ([place]) 05/31/2020  . Fibromyalgia 05/31/2020  . Mouth ulcers 05/25/2020   . Elevated glucose 12/23/2019  . Diabetic ketoacidosis without coma associated with type 2 diabetes mellitus ([place]) 12/23/2019  . De Quervain's tenosynovitis, left 11/16/2018  . Bilateral carpal tunnel syndrome 04/13/2018  . Leukocytosis 10/04/2017  . Hyperlipidemia 08/04/2017  . Ulcerative colitis with complication ([place]) 34/37/3578  . Plantar fasciitis 05/28/2017    Rico Junker, PT, DPT 10/23/20    9:16 PM    [facility] [address] [place], [place], [address] Phone: [phone]   Fax:  [phone]  Name: Beverly Ramirez MRN: [id number] Date of Birth: 1961/07/27

## 2020-10-23 NOTE — Patient Instructions (Addendum)
Gaze Stabilization - Tip Card  1.Target must remain in focus, not blurry, and appear stationary while head is in motion. 2.Perform exercises with small head movements (45 to either side of midline). 3.Increase speed of head motion so long as target is in focus. 4.If you wear eyeglasses, be sure you can see target through lens (therapist will give specific instructions for bifocal / progressive lenses). 5.These exercises may provoke dizziness or nausea. Work through these symptoms. If too dizzy, slow head movement slightly. Rest between each exercise. 6.Exercises demand concentration; avoid distractions. 7.For safety, perform standing exercises close to a counter, wall, corner, or next to someone.  Copyright  VHI. All rights reserved.   Gaze Stabilization - Standing Feet Apart   Feet shoulder width apart, keeping eyes on target on wall 3 feet away, tilt head down slightly and move head side to side for 60 seconds. Repeat while moving head up and down for 60 seconds.  Do 2-3 sessions per day.    Feet Together (Compliant Surface) Head Motion - Eyes Closed    Stand on compliant surface: Pillow with feet close together.  Have a counter top/wall/couch behind you and a chair in front for support. Close eyes and move head slowly, up and down 10 times.  Regain your balance and then perform head left and right, slowly 10 times. Repeat 2 times per session. Do 2 sessions per day.   Feet Heel-Toe "Tandem", Head Motion - Eyes Closed    Place right foot slightly ahead of the left.  Close your eyes and maintain your balance for 10 seconds. Open eyes, switch feet, close eyes and hold your balance for 10 seconds Repeat _2___ times per session. Do _2___ sessions per day.     Feet Heel-Toe "Tandem"    Arms outstretched, walk a straight line bringing one foot directly in front of the other. Repeat for 4 laps in hallway per session. Do __2__ sessions per day.  Copyright  VHI. All rights  reserved.

## 2020-10-26 ENCOUNTER — Encounter: Payer: Medicare Other | Attending: Endocrinology | Admitting: Dietician

## 2020-10-26 ENCOUNTER — Encounter: Payer: Self-pay | Admitting: Internal Medicine

## 2020-10-26 ENCOUNTER — Encounter: Payer: Self-pay | Admitting: Dietician

## 2020-10-26 ENCOUNTER — Other Ambulatory Visit: Payer: Self-pay

## 2020-10-26 ENCOUNTER — Ambulatory Visit (INDEPENDENT_AMBULATORY_CARE_PROVIDER_SITE_OTHER): Payer: Medicare Other | Admitting: Internal Medicine

## 2020-10-26 VITALS — BP 130/82 | HR 90 | Ht 62.0 in | Wt 166.4 lb

## 2020-10-26 DIAGNOSIS — Z794 Long term (current) use of insulin: Secondary | ICD-10-CM | POA: Insufficient documentation

## 2020-10-26 DIAGNOSIS — E1165 Type 2 diabetes mellitus with hyperglycemia: Secondary | ICD-10-CM | POA: Diagnosis not present

## 2020-10-26 LAB — POCT GLYCOSYLATED HEMOGLOBIN (HGB A1C): Hemoglobin A1C: 6.4 % — AB (ref 4.0–5.6)

## 2020-10-26 MED ORDER — DAPAGLIFLOZIN PROPANEDIOL 10 MG PO TABS: 10.0000 mg | ORAL_TABLET | Freq: Every day | ORAL | 3 refills | Status: DC

## 2020-10-26 NOTE — Progress Notes (Signed)
Diabetes Self-Management Education - 10/26/20 1700      Visit Information   Visit Type Follow-up      Psychosocial Assessment   Patient Belief/Attitude about Diabetes Motivated to manage diabetes    Self-management support Doctor's office    Other persons present Patient    Patient Concerns Healthy Lifestyle    Special Needs None      Pre-Education Assessment   Patient understands the diabetes disease and treatment process. Demonstrates understanding / competency    Patient understands incorporating nutritional management into lifestyle. Needs Review    Patient undertands incorporating physical activity into lifestyle. Demonstrates understanding / competency    Patient understands using medications safely. Needs Review    Patient understands monitoring blood glucose, interpreting and using results Demonstrates understanding / competency    Patient understands prevention, detection, and treatment of acute complications. Demonstrates understanding / competency    Patient understands prevention, detection, and treatment of chronic complications. Demonstrates understanding / competency    Patient understands how to develop strategies to address psychosocial issues. Demonstrates understanding / competency    Patient understands how to develop strategies to promote health/change behavior. Needs Review      Complications   Last HgB A1C per patient/outside source 6.4 %   10/26/2020     Dietary Intake   Breakfast oatmeal with raisins and 1/2 banana    Lunch salad with chicken, croutons, fruit OR sandwich OR occasiona    Dinner baked thicken or salmon, rice or pasta, vegetales    Beverage(s) water, occasional diluted juice, tea with sugar sub      Exercise   Exercise Type Light (walking / raking leaves)    How many days per week to you exercise? 6    How many minutes per day do you exercise? 30    Total minutes per week of exercise 180      Patient Education   Previous Diabetes  Education Yes (please comment)   continued   Nutrition management  Food label reading, portion sizes and measuring food.;Carbohydrate counting;Meal options for control of blood glucose level and chronic complications.    Physical activity and exercise  Other (comment)   encouraged continued   Medications Reviewed patients medication for diabetes, action, purpose, timing of dose and side effects.      Individualized Goals (developed by patient)   Nutrition Follow meal plan discussed   counted carbs   Physical Activity Exercise 5-7 days per week    Medications take my medication as prescribed    Monitoring  test my blood glucose as discussed      Patient Self-Evaluation of Goals - Patient rates self as meeting previously set goals (% of time)   Nutrition >75%    Physical Activity >75%    Medications >75%    Monitoring >75%    Problem Solving >75%    Reducing Risk >75%    Health Coping >75%      Post-Education Assessment   Patient understands the diabetes disease and treatment process. Demonstrates understanding / competency    Patient understands incorporating nutritional management into lifestyle. Needs Review    Patient undertands incorporating physical activity into lifestyle. Demonstrates understanding / competency    Patient understands using medications safely. Demonstrates understanding / competency    Patient understands monitoring blood glucose, interpreting and using results Demonstrates understanding / competency    Patient understands prevention, detection, and treatment of acute complications. Demonstrates understanding / competency    Patient understands prevention, detection,  and treatment of chronic complications. Demonstrates understanding / competency    Patient understands how to develop strategies to address psychosocial issues. Demonstrates understanding / competency    Patient understands how to develop strategies to promote health/change behavior. Demonstrates  understanding / competency      Outcomes   Expected Outcomes Demonstrated interest in learning. Expect positive outcomes    Future DMSE 3-4 months    Program Status Not Completed      Subsequent Visit   Since your last visit have you experienced any weight changes? Gain    Weight Gain (lbs) 8    Since your last visit, are you checking your blood glucose at least once a day? Yes          Diabetes Self-Management Education  Visit Type: Follow-up  Appt. Start Time: 1356 Appt. End Time: 9458  10/26/2020  Beverly Ramirez, identified by name and date of birth, is a 60 y.o. female with a diagnosis of Diabetes:  .   ASSESSMENT  Patient is here today alone and was last seen by myself 08/24/2020. She states that she had another colitis flair and was instructed to decrease her meal time insulin when she is not able to eat. She does the exercises recommended by PT and is walking 30 minutes most days. She has had a couple incidents of low blood sugar.  She continues to take the Novolog after her meals. States that she has continued to avoid most soda and uses Pure sugar sub instead. We discussed CGM briefly and patient does not think she would like something stuck to her for 2 weeks.  History includes Type 2 Diabetes, OSA (just diagnosed and to pick up a c-pap), fibromyalgia, ulcerative colitis A1C 6.4% 10/26/2020, 7.1% 04/20/20, 8% 03/02/2020, 9.3% 02/02/2020 Medications include: Basaglar 30 units (decreased to 28 units today) units q HS, Novolog 23 units (decreased to 12 units today with a sliding scale ICF of 2 units for every 25 points starting at 180) before each meal, Farxiga (does not tolerate metformin)  Weight hx:  166 lbs 10/26/2020 158 lbs 05/31/2020 167 lbs end of May 2021 decreased to 147 lbs with illness in May. 154 lbs 04/13/2020  Patient lives with her husband and daughter and moved from [place] about 3 years ago. She worked for the [employer].  She  does the shopping and her and her daughter share the cooking.  Weight 166 lb (75.3 kg). Body mass index is 30.36 kg/m.  Weight 166 lb (75.3 kg). Body mass index is 30.36 kg/m.   Diabetes Self-Management Education - 10/26/20 1700      Visit Information   Visit Type Follow-up      Psychosocial Assessment   Patient Belief/Attitude about Diabetes Motivated to manage diabetes    Self-management support Doctor's office    Other persons present Patient    Patient Concerns Healthy Lifestyle    Special Needs None      Pre-Education Assessment   Patient understands the diabetes disease and treatment process. Demonstrates understanding / competency    Patient understands incorporating nutritional management into lifestyle. Needs Review    Patient undertands incorporating physical activity into lifestyle. Demonstrates understanding / competency    Patient understands using medications safely. Needs Review    Patient understands monitoring blood glucose, interpreting and using results Demonstrates understanding / competency    Patient understands prevention, detection, and treatment of acute complications. Demonstrates understanding / competency    Patient understands prevention, detection,  and treatment of chronic complications. Demonstrates understanding / competency    Patient understands how to develop strategies to address psychosocial issues. Demonstrates understanding / competency    Patient understands how to develop strategies to promote health/change behavior. Needs Review      Complications   Last HgB A1C per patient/outside source 6.4 %   10/26/2020     Dietary Intake   Breakfast oatmeal with raisins and 1/2 banana    Lunch salad with chicken, croutons, fruit OR sandwich OR occasiona    Dinner baked thicken or salmon, rice or pasta, vegetales    Beverage(s) water, occasional diluted juice, tea with sugar sub      Exercise   Exercise Type Light (walking / raking leaves)     How many days per week to you exercise? 6    How many minutes per day do you exercise? 30    Total minutes per week of exercise 180      Patient Education   Previous Diabetes Education Yes (please comment)   continued   Nutrition management  Food label reading, portion sizes and measuring food.;Carbohydrate counting;Meal options for control of blood glucose level and chronic complications.    Physical activity and exercise  Other (comment)   encouraged continued   Medications Reviewed patients medication for diabetes, action, purpose, timing of dose and side effects.      Individualized Goals (developed by patient)   Nutrition Follow meal plan discussed   counted carbs   Physical Activity Exercise 5-7 days per week    Medications take my medication as prescribed    Monitoring  test my blood glucose as discussed      Patient Self-Evaluation of Goals - Patient rates self as meeting previously set goals (% of time)   Nutrition >75%    Physical Activity >75%    Medications >75%    Monitoring >75%    Problem Solving >75%    Reducing Risk >75%    Health Coping >75%      Post-Education Assessment   Patient understands the diabetes disease and treatment process. Demonstrates understanding / competency    Patient understands incorporating nutritional management into lifestyle. Needs Review    Patient undertands incorporating physical activity into lifestyle. Demonstrates understanding / competency    Patient understands using medications safely. Demonstrates understanding / competency    Patient understands monitoring blood glucose, interpreting and using results Demonstrates understanding / competency    Patient understands prevention, detection, and treatment of acute complications. Demonstrates understanding / competency    Patient understands prevention, detection, and treatment of chronic complications. Demonstrates understanding / competency    Patient understands how to develop  strategies to address psychosocial issues. Demonstrates understanding / competency    Patient understands how to develop strategies to promote health/change behavior. Demonstrates understanding / competency      Outcomes   Expected Outcomes Demonstrated interest in learning. Expect positive outcomes    Future DMSE 3-4 months    Program Status Not Completed      Subsequent Visit   Since your last visit have you experienced any weight changes? Gain    Weight Gain (lbs) 8    Since your last visit, are you checking your blood glucose at least once a day? Yes           Individualized Plan for Diabetes Self-Management Training:   Learning Objective:  Patient will have a greater understanding of diabetes self-management. Patient education plan is to attend individual and/or  group sessions per assessed needs and concerns.   Plan:   Patient Instructions  Practice carbohydrate counting Read labels  Great job on drinking more water and choosing more low fat options. Continue to stay active.  Aim for 30 minutes most days. Continue checking your blood sugar.  Take the Novolog before your meals.   Expected Outcomes:  Demonstrated interest in learning. Expect positive outcomes  Education material provided:   If problems or questions, patient to contact team via:  Phone  Future DSME appointment: 3-4 months

## 2020-10-26 NOTE — Patient Instructions (Addendum)
-   Increase Farxiga to 10 mg, 1 tablet with breakfast  - Decrease Basaglar to 28  units daily  - Decrease Novolog to 12 units daily  -Novolog correctional insulin: ADD extra units on insulin to your meal-time Novolog dose if your blood sugars are higher than 170. Use the scale below to help guide you:   Blood sugar before meal Number of units to inject  Less than 180 0 unit  181 - 205 2 units  206 -  230 3 units  231 -  255 4 units  256 -  280 5 units  281 -  305 6 units      HOW TO TREAT LOW BLOOD SUGARS (Blood sugar LESS THAN 70 MG/DL)  Please follow the RULE OF 15 for the treatment of hypoglycemia treatment (when your (blood sugars are less than 70 mg/dL)    STEP 1: Take 15 grams of carbohydrates when your blood sugar is low, which includes:   4 GLUCOSE TABS  OR  4 OZ OF JUICE OR REGULAR SODA OR  ONE TUBE OF GLUCOSE GEL     STEP 2: RECHECK blood sugar in 15 MINUTES STEP 3: If your blood sugar is still low at the 15 minute recheck --> then, go back to STEP 1 and treat AGAIN with another 15 grams of carbohydrates.

## 2020-10-26 NOTE — Progress Notes (Signed)
Name: Beverly Ramirez  Age/ Sex: 60 y.o., female   MRN/ DOB: [id number], April 22, 1961     PCP: Patient, No Pcp Per   Reason for Endocrinology Evaluation: Type 2 Diabetes Mellitus  Initial Endocrine Consultative Visit: 07/23/2020    PATIENT IDENTIFIER: Beverly Ramirez is a 60 y.o. female with a past medical history of T2DM, OSA, fibromyalgia and UC. The patient has followed with Endocrinology clinic since 07/23/2020 for consultative assistance with management of her diabetes.  DIABETIC HISTORY:  Beverly Ramirez was diagnosed with DM in 2019.Has been on insulin intermittently while on prednisone,  Metformin- too sick. Marland Kitchen Her hemoglobin A1c has ranged from 7.1%in 2021 , peaking at 9.5% in 2020    On her initial visit to our clinic she had an A1c of 7.2 % she was on basal insulin and Novolog, We added farxiga    SUBJECTIVE:   During the last visit (07/23/2020): A1c 7.2% Decrease MDI regimen and started [place]      Today (10/26/2020): Beverly Ramirez is here for a follow up diabetes management.  She checks her blood sugars 1 times daily, preprandial to breakfast . The patient has had hypoglycemic episodes since the last clinic visit, which typically occur 1x /week - most often occuring during the day . The patient is  symptomatic with these episodes.     Denies any side effects to farxiga Has nausea and diarrhea due to [facility]    HOME DIABETES REGIMEN:  Farxiga 5 mg, 1 tablet with breakfast  Basaglar 32 units daily - has been taking 30 units  Novolog 22 units daily -23 units with each meal  CF : Novolog ( BG -130/20)     Statin: yes ACE-I/ARB: no    METER DOWNLOAD SUMMARY: Did not     DIABETIC COMPLICATIONS: Microvascular complications:   Denies: CKD, retinopathy, neuropathy Last Eye Exam: Completed 11/2019  Macrovascular complications:   Denies: CAD, CVA, PVD   HISTORY:  Past Medical History:  Past Medical History:  Diagnosis Date  . Anemia   .  Carpal tunnel syndrome    bilateral  . Chronic heel pain   . Diabetes mellitus without complication ([place])   . DJD (degenerative joint disease) of cervical spine    MRI 2017  . Hyperlipidemia   . Plantar fasciitis    bilateral  . Sickle cell anemia (HCC)    sickle cell trait  . Sleep apnea   . Tendonitis    left hand/wrist  . UC (ulcerative colitis) ([place])    Past Surgical History:  Past Surgical History:  Procedure Laterality Date  . ABDOMINAL HYSTERECTOMY    . APPENDECTOMY    . CESAREAN SECTION     x4  . COLONOSCOPY     Multiple in [place]  . ESOPHAGOGASTRODUODENOSCOPY     Social History:  reports that she quit smoking about 14 years ago. Her smoking use included cigarettes. She has never used smokeless tobacco. She reports current alcohol use. She reports that she does not use drugs. Family History:  Family History  Problem Relation Age of Onset  . Stroke Mother   . Diabetes Mother   . Hypertension Mother   . Lung cancer Mother   . Sickle cell trait Mother   . Diabetes Maternal Aunt   . Lung cancer Maternal Aunt   . Lung cancer Maternal Aunt   . Lung cancer Maternal Aunt   . Colon cancer Cousin 62  . Sickle cell trait Father   . Sickle cell  anemia Sister 41  . Lung cancer Maternal Grandmother   . Sickle cell anemia Sister   . Other Sister        accident and blood clot formed     HOME MEDICATIONS: Allergies as of 10/26/2020      Reactions   Codeine Itching, Rash, Hives   Latex Hives   With the power      Medication List       Accurate as of October 26, 2020  2:08 PM. If you have any questions, ask your nurse or doctor.        albuterol 108 (90 Base) MCG/ACT inhaler Commonly known as: VENTOLIN HFA Inhale 2 puffs into the lungs every 6 (six) hours as needed for wheezing or shortness of breath.   amLODipine 5 MG tablet Commonly known as: NORVASC TAKE 1 TABLET BY MOUTH EVERY DAY   atorvastatin 40 MG tablet Commonly known as: LIPITOR Take 1  tablet (40 mg total) by mouth daily.   Basaglar KwikPen 100 UNIT/ML Inject 32 Units into the skin at bedtime.   dapagliflozin propanediol 10 MG Tabs tablet Commonly known as: Farxiga Take 1 tablet (10 mg total) by mouth daily. What changed:  medication strength how much to take when to take this Changed by: Dorita Sciara, MD   dicyclomine 20 MG tablet Commonly known as: BENTYL Take 1 tablet (20 mg total) by mouth 3 (three) times daily as needed for spasms (abdominal cramping).   diphenoxylate-atropine 2.5-0.025 MG tablet Commonly known as: LOMOTIL Take 1 tablet by mouth 4 (four) times daily as needed for diarrhea or loose stools.   DULoxetine 60 MG capsule Commonly known as: CYMBALTA TAKE 1 CAPSULE BY MOUTH EVERY DAY   esomeprazole 40 MG capsule Commonly known as: NEXIUM TAKE 1 CAPSULE BY MOUTH EVERY DAY BEFORE BREAKFAST   fluticasone 50 MCG/ACT nasal spray Commonly known as: FLONASE SPRAY 2 SPRAYS INTO EACH NOSTRIL EVERY DAY What changed: See the new instructions.   Insulin Pen Needle 32G X 4 MM Misc 1 Device by Does not apply route in the morning, at noon, in the evening, and at bedtime.   lidocaine 2 % solution Commonly known as: XYLOCAINE Use as directed 15 mLs in the mouth or throat as needed for mouth pain.   mesalamine 1.2 g EC tablet Commonly known as: LIALDA Take 2 tablets (2.4 g total) by mouth 2 (two) times daily.   methocarbamol 500 MG tablet Commonly known as: ROBAXIN Take 1 tablet (500 mg total) by mouth every 8 (eight) hours as needed for muscle spasms.   montelukast 10 MG tablet Commonly known as: SINGULAIR TAKE 1 TABLET BY MOUTH EVERYDAY AT BEDTIME   NONFORMULARY OR COMPOUNDED ITEM Swish and spit 5 mLs as needed (ulcer flare up in mouth). Magic mouthwash with lidocaine disp#150 ml   NovoLOG FlexPen 100 UNIT/ML FlexPen Generic drug: insulin aspart Inject 22 Units into the skin 3 (three) times daily with meals.   ondansetron 4 MG  disintegrating tablet Commonly known as: Zofran ODT Take 1 tablet (4 mg total) by mouth every 8 (eight) hours as needed for nausea or vomiting.   OneTouch Delica Plus [id number] Misc 1 each by Does not apply route 3 (three) times daily. Dx E11.65, z79.4   OneTouch Verio test strip Generic drug: glucose blood Check blood glucose 3-4 x day. Dx E11.9, E11.649, z79.4,   triamcinolone 0.1 % Commonly known as: KENALOG Apply 1 application topically 2 (two) times daily.   [place]  VITAMIN/MIN PO Take by mouth.        OBJECTIVE:   Vital Signs: BP 130/82   Pulse 90   Ht 5' 2"  (1.575 m)   Wt 166 lb 6 oz (75.5 kg)   SpO2 98%   BMI 30.43 kg/m   Wt Readings from Last 3 Encounters:  10/26/20 166 lb 6 oz (75.5 kg)  10/18/20 161 lb 3.2 oz (73.1 kg)  09/11/20 163 lb (73.9 kg)     Exam: General: Pt appears well and is in NAD  Lungs: Clear with good BS bilat with no rales, rhonchi, or wheezes  Heart: RRR with normal S1 and S2 and no gallops; no murmurs; no rub  Abdomen: Normoactive bowel sounds, soft, nontender, without masses or organomegaly palpable  Extremities: No pretibial edema.   Neuro: MS is good with appropriate affect, pt is alert and Ox3      DM Foot Exam 10/26/2020 The skin of the feet is intact without sores or ulcerations. The pedal pulses are 2+ on right and 2+ on left. The sensation is intact to a screening 5.07, 10 gram monofilament bilaterally        DATA REVIEWED:  Lab Results  Component Value Date   HGBA1C 6.4 (A) 10/26/2020   HGBA1C 7.2 (A) 07/23/2020   HGBA1C 7.1 (H) 04/20/2020   Lab Results  Component Value Date   LDLCALC 179 (H) 02/02/2020   CREATININE 0.85 09/11/2020   Lab Results  Component Value Date   MICRALBCREAT <25 01/14/2019     Lab Results  Component Value Date   CHOL 250 (H) 02/02/2020   HDL 40 02/02/2020   LDLCALC 179 (H) 02/02/2020   TRIG 167 (H) 02/02/2020   CHOLHDL 6.3 (H) 02/02/2020         ASSESSMENT / PLAN  / RECOMMENDATIONS:   1) Type 2 Diabetes Mellitus, Optimally controlled, Without complications - Most recent A1c of 6.4 %. Goal A1c < 7.0 %.    - A1c down from 7.2 %  - Pt with hypoglycemia during the day, will reduce prandial insulin as below  - She also tells me she puts herself on a liquid diet with episodes of colitis , and would continue to take the full dose of Novolog, pt advised to hold standing dose of novolog with liquid diet and use correction dose only if needed.  - Will increase farxiga and make the following adjustments    MEDICATIONS: - Increase Farxiga to 10 mg, 1 tablet with breakfast  - Decrease Basaglar 28 units daily  - Decrease Novolog to 12 units daily  - CF : Novolog ( BG -130/25)  EDUCATION / INSTRUCTIONS: BG monitoring instructions: Patient is instructed to check her blood sugars 3 times a day, before meals . Call [facility] if: BG persistently < 70 I reviewed the Rule of 15 for the treatment of hypoglycemia in detail with the patient. Literature supplied.   2) Diabetic complications:  Eye: Does not have known diabetic retinopathy.  Neuro/ Feet: Does not have known diabetic peripheral neuropathy .  Renal: Patient does not have known baseline CKD. She   is on an ACEI/ARB at present.     F/U in 4 months    Signed electronically by: Mack Guise, MD  [facility] Endocrinology  [address] [place], [place] [address] Phone: [phone] FAX: [phone]   CC: Patient, No Pcp Per No address on file Phone: None  Fax: None  Return to  Endocrinology clinic as below: Future Appointments  Date Time Provider [place]  10/29/2020  2:00 PM Rico Junker, PT [facility]  11/05/2020  2:45 PM Rico Junker, PT [facility]  11/12/2020  2:45 PM Rico Junker, PT [facility]  11/19/2020 12:30 PM Rico Junker, PT [facility]  03/01/2021  1:20 PM Viviene Thurston, Melanie Crazier,  MD [facility] None

## 2020-10-26 NOTE — Patient Instructions (Addendum)
Practice carbohydrate counting Read labels  Doristine Devoid job on drinking more water and choosing more low fat options. Continue to stay active.  Aim for 30 minutes most days. Continue checking your blood sugar.  Take the Novolog before your meals.

## 2020-10-29 ENCOUNTER — Ambulatory Visit: Payer: Medicare Other | Admitting: Physical Therapy

## 2020-10-29 ENCOUNTER — Institutional Professional Consult (permissible substitution): Payer: Medicare Other | Admitting: Neurology

## 2020-10-30 NOTE — Telephone Encounter (Signed)
error 

## 2020-11-05 ENCOUNTER — Ambulatory Visit: Payer: Medicare Other | Admitting: Physical Therapy

## 2020-11-05 ENCOUNTER — Other Ambulatory Visit: Payer: Self-pay

## 2020-11-05 ENCOUNTER — Encounter: Payer: Self-pay | Admitting: Physical Therapy

## 2020-11-05 DIAGNOSIS — R2681 Unsteadiness on feet: Secondary | ICD-10-CM | POA: Diagnosis not present

## 2020-11-05 DIAGNOSIS — R262 Difficulty in walking, not elsewhere classified: Secondary | ICD-10-CM | POA: Diagnosis not present

## 2020-11-05 DIAGNOSIS — R42 Dizziness and giddiness: Secondary | ICD-10-CM

## 2020-11-05 DIAGNOSIS — M542 Cervicalgia: Secondary | ICD-10-CM

## 2020-11-05 NOTE — Therapy (Signed)
[facility] [address] [place], [place], [address] Phone: [phone]   Fax:  [phone]  Physical Therapy Treatment  Patient Details  Name: Beverly Ramirez MRN: [id number] Date of Birth: [birth year] Referring Provider (PT): Leta Baptist, MD   Encounter Date: 11/05/2020   PT End of Session - 11/05/20 1659    Visit Number 4    Number of Visits 7    Date for PT Re-Evaluation 11/23/20    Authorization Type Medicare; 10th visit PN    Progress Note Due on Visit 10    PT Start Time 1445    PT Stop Time 1536    PT Time Calculation (min) 51 min    Activity Tolerance Patient tolerated treatment well    Behavior During Therapy [facility] for tasks assessed/performed           Past Medical History:  Diagnosis Date  . Anemia   . Carpal tunnel syndrome    bilateral  . Chronic heel pain   . Diabetes mellitus without complication ([place])   . DJD (degenerative joint disease) of cervical spine    MRI 2017  . Hyperlipidemia   . Plantar fasciitis    bilateral  . Sickle cell anemia (HCC)    sickle cell trait  . Sleep apnea   . Tendonitis    left hand/wrist  . UC (ulcerative colitis) ([place])     Past Surgical History:  Procedure Laterality Date  . ABDOMINAL HYSTERECTOMY    . APPENDECTOMY    . CESAREAN SECTION     x4  . COLONOSCOPY     Multiple in [place]  . ESOPHAGOGASTRODUODENOSCOPY      There were no vitals filed for this visit.   Subjective Assessment - 11/05/20 1456    Subjective Got the days mixed up last week.  Still waiting on CPAP machine.  Symptoms are good, exercises are going well.    Pertinent History ulcerative colitis, HLD, HTN, DM, De Quervain's tenosynovitis, bilat carpal tunnel, plantar fasciitis, fibromyalgia, sickle cell anemia, OSA    Limitations Standing;Walking;House hold activities    Patient Stated Goals To just stop spinning.    Currently in Pain? No/denies                               Vestibular Treatment/Exercise - 11/05/20 1458      Vestibular Treatment/Exercise   Vestibular Treatment Provided Gaze    Habituation Exercises 180 degree Turns    Gaze Exercises X1 Viewing Horizontal;X1 Viewing Vertical      180 degree Turns   Number of Reps  12    Symptom Description  to L and R moving along 30 feet path in gym with bending down passing ball around legs figure 8; mild dizziness with multiple turns.  Required min A and standing rest break after one lap to allow symptoms to settle.      X1 Viewing Horizontal   Foot Position feet together and then feet apart on pillow    Reps 2    Comments 60 seconds; required UE support and external support with feet together so changed back to feet apart but on pillow      X1 Viewing Vertical   Foot Position feet together and then feet apart on pillow    Reps 2    Comments 60 seconds; required UE support and external support with feet together so changed back to feet apart but  on pillow              Balance Exercises - 11/05/20 1657      Balance Exercises: Standing   Standing Eyes Closed Narrow base of support (BOS);Head turns;Foam/compliant surface;Solid surface;Other reps (comment);10 secs;20 secs    Standing Eyes Closed Limitations feet together on pillow with 10 reps head turns/nods - pt continues to have moderate sway and continues to require UE support for safety.  Feet staggered on solid surface - performed 10 second hold with no issues, increased time to 20 seconds.    Tandem Gait Forward;4 reps;Limitations    Tandem Gait Limitations performed without UE support; progressed to head turn to L and R every 4-5 steps, still without UE support             PT Education - 11/05/20 1659    Education Details progressed HEP, added one visit to make up for missed visit last week    Person(s) Educated Patient    Methods Explanation;Demonstration;Handout    Comprehension Verbalized  understanding;Returned demonstration            PT Short Term Goals - 10/23/20 2115      PT SHORT TERM GOAL #1   Title Pt will participate in further assessment of falls risk during dynamic gait with FGA    Time 3    Period Weeks    Status Achieved    Target Date 10/30/20      PT SHORT TERM GOAL #2   Title Pt will initiate vestibular, balance and neck HEP    Time 3    Period Weeks    Status Achieved    Target Date 10/30/20             PT Long Term Goals - 10/17/20 1530      PT LONG TERM GOAL #1   Title Pt will report improvement on DFS to 59% and 5 point increase in DPS    Baseline DFS: 58%, DPS: 55.3    Time 6    Period Weeks    Status New    Target Date 11/23/20      PT LONG TERM GOAL #2   Title Pt will demonstrate independence with final vestibular, balance and neck HEP    Time 6    Period Weeks    Status New    Target Date 11/23/20      PT LONG TERM GOAL #3   Title Pt will demonstrate 20 deg increase in neck flexion/extension and 10 deg increase in lateral flexion and rotation bilaterally and will report 50% reduction in HA    Time 6    Period Weeks    Status New    Target Date 11/23/20      PT LONG TERM GOAL #4   Title Pt will report 75% reduction in dizziness with supine <> sit, quick head/body turns, bending forwards and when driving    Time 6    Period Weeks    Status New    Target Date 11/23/20      PT LONG TERM GOAL #5   Title Pt will demonstrate 4 point improvement in FGA to indicate decreased falls risk    Baseline 24/30    Time 6    Period Weeks    Status New    Target Date 11/23/20                 Plan - 11/05/20 1700    Clinical Impression Statement  Pt is making steady progress but unable to progress x1 viewing to narrow BOS due to significant sway.  Maintained wide BOS but added compliant pillow.  Unable to significantly progress corner balance exercises today but able to add intermittent head turns to tandem gait.   Introduced more dynamic challenges of combined movements - bending down with 180 deg turns with pt reporting mild-moderate dizziness.  Will continue to address in order to progress towards LTG.    Personal Factors and Comorbidities Comorbidity 3+;Past/Current Experience    Comorbidities ulcerative colitis, HLD, HTN, DM, De Quervain's tenosynovitis, bilat carpal tunnel, plantar fasciitis, fibromyalgia, sickle cell anemia, OSA    Examination-Activity Limitations Bed Mobility;Bend;Locomotion Level;Reach Overhead    Examination-Participation Restrictions Community Activity;Driving    PT Frequency 1x / week    PT Duration 6 weeks    PT Treatment/Interventions ADLs/Self Care Home Management;Canalith Repostioning;Cryotherapy;Moist Heat;Gait training;Stair training;Functional mobility training;Therapeutic activities;Therapeutic exercise;Balance training;Neuromuscular re-education;Patient/family education;Manual techniques;Passive range of motion;Dry needling;Vestibular;Taping    PT Next Visit Plan Try to progress x1 to feet together.  Figure 8 ball around legs with turns-progress to compliant surface and looking up.  Balance on compliant surfaces or narrow stance - balance beam, rockerboard, etc.    Consulted and Agree with Plan of Care Patient           Patient will benefit from skilled therapeutic intervention in order to improve the following deficits and impairments:  Decreased balance,Decreased range of motion,Difficulty walking,Dizziness,Pain  Visit Diagnosis: Dizziness and giddiness  Cervicalgia  Unsteadiness on feet  Difficulty in walking, not elsewhere classified     Problem List Patient Active Problem List   Diagnosis Date Noted  . Type 2 diabetes mellitus with hyperglycemia, with long-term current use of insulin ([place]) 05/31/2020  . Fibromyalgia 05/31/2020  . Mouth ulcers 05/25/2020  . Elevated glucose 12/23/2019  . Diabetic ketoacidosis without coma associated with type 2  diabetes mellitus ([place]) 12/23/2019  . De Quervain's tenosynovitis, left 11/16/2018  . Bilateral carpal tunnel syndrome 04/13/2018  . Leukocytosis 10/04/2017  . Hyperlipidemia 08/04/2017  . Ulcerative colitis with complication ([place]) 81/15/7262  . Plantar fasciitis 05/28/2017    Rico Junker, PT, DPT 11/05/20    5:06 PM    [facility] [address] [place], [place], [address] Phone: [phone]   Fax:  [phone]  Name: Akiah Bauch MRN: [id number] Date of Birth: 10-21-1960

## 2020-11-05 NOTE — Patient Instructions (Addendum)
Gaze Stabilization: Standing Feet Apart (Compliant Surface)    Feet apart on pillow, keeping eyes on target on wall __3__ feet away, tilt head down 15-30 and move head side to side for __60__ seconds. Repeat while moving head up and down for __60__ seconds. Do __2__ sessions per day.    Feet Together (Compliant Surface) Head Motion - Eyes Closed    Stand on compliant surface: Pillow with feet close together.  Have a counter top/wall/couch behind you and a chair in front for support. Close eyes and move head slowly, up and down 10 times.  Regain your balance and then perform head left and right, slowly 10 times. Repeat 2 times per session. Do 2 sessions per day.   Feet Heel-Toe "Tandem", Head Motion - Eyes Closed    Place right foot slightly ahead of the left.  Close your eyes and maintain your balance for 20 seconds. Open eyes, switch feet, close eyes and hold your balance for 20 seconds Repeat _2___ times per session. Do _2___ sessions per day.     Feet Heel-Toe "Tandem"    Arms outstretched, walk a straight line bringing one foot directly in front of the other.  Turn your head and look to the right for 4-5 steps and then switch, look to the left for 4-5 steps.   Repeat for 4 laps in hallway per session. Do __2__ sessions per day.

## 2020-11-06 ENCOUNTER — Ambulatory Visit (INDEPENDENT_AMBULATORY_CARE_PROVIDER_SITE_OTHER): Payer: Medicare Other | Admitting: Registered Nurse

## 2020-11-06 ENCOUNTER — Encounter: Payer: Self-pay | Admitting: Registered Nurse

## 2020-11-06 VITALS — BP 125/85 | HR 100 | Temp 97.8°F | Resp 18 | Ht 62.0 in | Wt 159.0 lb

## 2020-11-06 DIAGNOSIS — N390 Urinary tract infection, site not specified: Secondary | ICD-10-CM

## 2020-11-06 DIAGNOSIS — Z794 Long term (current) use of insulin: Secondary | ICD-10-CM

## 2020-11-06 DIAGNOSIS — E1165 Type 2 diabetes mellitus with hyperglycemia: Secondary | ICD-10-CM

## 2020-11-06 LAB — POCT URINALYSIS DIP (CLINITEK)
Bilirubin, UA: NEGATIVE
Glucose, UA: >=1000 mg/dL — AB
Ketones, POC UA: NEGATIVE mg/dL
Nitrite, UA: NEGATIVE
POC PROTEIN,UA: 100 — AB
Spec Grav, UA: 1.015 (ref 1.010–1.025)
Urobilinogen, UA: 0.2 E.U./dL
pH, UA: 6.5 (ref 5.0–8.0)

## 2020-11-06 MED ORDER — SULFAMETHOXAZOLE-TRIMETHOPRIM 800-160 MG PO TABS: 1.0000 | ORAL_TABLET | Freq: Two times a day (BID) | ORAL | 0 refills | Status: DC

## 2020-11-06 MED ORDER — ONETOUCH VERIO VI STRP: ORAL_STRIP | 12 refills | Status: DC

## 2020-11-06 NOTE — Patient Instructions (Signed)
° ° ° °  If you have lab work done today you will be contacted with your lab results within the next 2 weeks.  If you have not heard from us then please contact us. The fastest way to get your results is to register for My Chart. ° ° °IF you received an x-ray today, you will receive an invoice from [facility]. Please contact [facility] at [phone] with questions or concerns regarding your invoice.  ° °IF you received labwork today, you will receive an invoice from [facility]. Please contact [facility] at [phone] with questions or concerns regarding your invoice.  ° °Our billing staff will not be able to assist you with questions regarding bills from these companies. ° °You will be contacted with the lab results as soon as they are available. The fastest way to get your results is to activate your My Chart account. Instructions are located on the last page of this paperwork. If you have not heard from us regarding the results in 2 weeks, please contact this office. °  ° ° ° °

## 2020-11-06 NOTE — Progress Notes (Signed)
Acute Office Visit  Subjective:    Patient ID: Beverly Ramirez, female    DOB: [birth year], 60 y.o.   MRN: [id number]  Chief Complaint  Patient presents with  . Urinary Tract Infection    Patient states since saturdays she has been experiencing frequent urination with an strong odor.    HPI Patient is in today for 3 days of dysuria Frequency Urgency No vaginal symptoms Some L flank pain No nvd, fever, chills, fatigue No other symptoms Been some time since she has had UTI Uses farxiga for t2dm  Past Medical History:  Diagnosis Date  . Anemia   . Carpal tunnel syndrome    bilateral  . Chronic heel pain   . Diabetes mellitus without complication ([place])   . DJD (degenerative joint disease) of cervical spine    MRI 2017  . Hyperlipidemia   . Plantar fasciitis    bilateral  . Sickle cell anemia (HCC)    sickle cell trait  . Sleep apnea   . Tendonitis    left hand/wrist  . UC (ulcerative colitis) ([facility])     Past Surgical History:  Procedure Laterality Date  . ABDOMINAL HYSTERECTOMY    . APPENDECTOMY    . CESAREAN SECTION     x4  . COLONOSCOPY     Multiple in [place]  . ESOPHAGOGASTRODUODENOSCOPY      Family History  Problem Relation Age of Onset  . Stroke Mother   . Diabetes Mother   . Hypertension Mother   . Lung cancer Mother   . Sickle cell trait Mother   . Diabetes Maternal Aunt   . Lung cancer Maternal Aunt   . Lung cancer Maternal Aunt   . Lung cancer Maternal Aunt   . Colon cancer Cousin 59  . Sickle cell trait Father   . Sickle cell anemia Sister 66  . Lung cancer Maternal Grandmother   . Sickle cell anemia Sister   . Other Sister        accident and blood clot formed    Social History   Socioeconomic History  . Marital status: Married    Spouse name: Simona Huh  . Number of children: 4  . Years of education: Not on file  . Highest education level: Not on file  Occupational History  . Occupation: retired    Comment: He formerly  worked for [employer], disabled due to a fall  Tobacco Use  . Smoking status: Former Smoker    Types: Cigarettes    Quit date: 10/13/2006    Years since quitting: 14.0  . Smokeless tobacco: Never Used  Vaping Use  . Vaping Use: Never used  Substance and Sexual Activity  . Alcohol use: Yes    Comment: occasional  . Drug use: No  . Sexual activity: Yes    Partners: Male    Comment: 1ST intercourse- 73, partners- 73, married- 60 yrs   Other Topics Concern  . Not on file  Social History Narrative   She is married, 3 sons born 1979, 1981, 1984.  One daughter born in 5.   She is medically retired from the [employer] after a fall and a hip injury, around 2004.   Caffeinated beverage every other day x1   07/13/2017   Social Determinants of Health   Financial Resource Strain: Not on file  Food Insecurity: Not on file  Transportation Needs: Not on file  Physical Activity: Not on file  Stress: Not on file  Social Connections: Not on file  Intimate Partner Violence: Not on file    Outpatient Medications Prior to Visit  Medication Sig Dispense Refill  . albuterol (VENTOLIN HFA) 108 (90 Base) MCG/ACT inhaler Inhale 2 puffs into the lungs every 6 (six) hours as needed for wheezing or shortness of breath. 18 g 3  . amLODipine (NORVASC) 5 MG tablet TAKE 1 TABLET BY MOUTH EVERY DAY 90 tablet 0  . atorvastatin (LIPITOR) 40 MG tablet Take 1 tablet (40 mg total) by mouth daily. 90 tablet 3  . dapagliflozin propanediol (FARXIGA) 10 MG TABS tablet Take 1 tablet (10 mg total) by mouth daily. 90 tablet 3  . dicyclomine (BENTYL) 20 MG tablet Take 1 tablet (20 mg total) by mouth 3 (three) times daily as needed for spasms (abdominal cramping). 20 tablet 0  . diphenoxylate-atropine (LOMOTIL) 2.5-0.025 MG tablet Take 1 tablet by mouth 4 (four) times daily as needed for diarrhea or loose stools. 60 tablet 0  . DULoxetine (CYMBALTA) 60 MG capsule TAKE 1 CAPSULE  BY MOUTH EVERY DAY 90 capsule 1  . esomeprazole (NEXIUM) 40 MG capsule TAKE 1 CAPSULE BY MOUTH EVERY DAY BEFORE BREAKFAST 30 capsule 11  . fluticasone (FLONASE) 50 MCG/ACT nasal spray SPRAY 2 SPRAYS INTO EACH NOSTRIL EVERY DAY (Patient taking differently: Patient takes as needed) 48 mL 5  . insulin aspart (NOVOLOG FLEXPEN) 100 UNIT/ML FlexPen Inject 22 Units into the skin 3 (three) times daily with meals. 30 mL 11  . Insulin Glargine (BASAGLAR KWIKPEN) 100 UNIT/ML Inject 32 Units into the skin at bedtime. 15 mL 6  . Insulin Pen Needle 32G X 4 MM MISC 1 Device by Does not apply route in the morning, at noon, in the evening, and at bedtime. 150 each 11  . Lancets (ONETOUCH DELICA PLUS [id number]) MISC 1 each by Does not apply route 3 (three) times daily. Dx E11.65, z79.4 100 each 11  . lidocaine (XYLOCAINE) 2 % solution Use as directed 15 mLs in the mouth or throat as needed for mouth pain. 200 mL 0  . mesalamine (LIALDA) 1.2 g EC tablet Take 2 tablets (2.4 g total) by mouth 2 (two) times daily. 360 tablet 3  . methocarbamol (ROBAXIN) 500 MG tablet Take 1 tablet (500 mg total) by mouth every 8 (eight) hours as needed for muscle spasms. 90 tablet 1  . montelukast (SINGULAIR) 10 MG tablet TAKE 1 TABLET BY MOUTH EVERYDAY AT BEDTIME 90 tablet 1  . Multiple Vitamins-Minerals (WOMENS 50+ MULTI VITAMIN/MIN PO) Take by mouth.    . NONFORMULARY OR COMPOUNDED ITEM Swish and spit 5 mLs as needed (ulcer flare up in mouth). Magic mouthwash with lidocaine disp#150 ml 150 each 1  . ondansetron (ZOFRAN ODT) 4 MG disintegrating tablet Take 1 tablet (4 mg total) by mouth every 8 (eight) hours as needed for nausea or vomiting. 20 tablet 0  . triamcinolone cream (KENALOG) 0.1 % Apply 1 application topically 2 (two) times daily. 30 g 2  . glucose blood (ONETOUCH VERIO) test strip Check blood glucose 3-4 x day. Dx E11.9, E11.649, z79.4, 100 each 12   No facility-administered medications prior to visit.    Allergies   Allergen Reactions  . Codeine Itching, Rash and Hives  . Latex Hives    With the power    Review of Systems Per hpi      Objective:    Physical Exam Vitals and nursing note reviewed.  Constitutional:  General: She is not in acute distress.    Appearance: Normal appearance. She is not ill-appearing, toxic-appearing or diaphoretic.  Cardiovascular:     Rate and Rhythm: Normal rate and regular rhythm.     Pulses: Normal pulses.     Heart sounds: Normal heart sounds. No murmur heard. No friction rub. No gallop.   Pulmonary:     Effort: Pulmonary effort is normal. No respiratory distress.     Breath sounds: Normal breath sounds. No stridor. No wheezing, rhonchi or rales.  Chest:     Chest wall: No tenderness.  Skin:    General: Skin is warm and dry.     Capillary Refill: Capillary refill takes less than 2 seconds.  Neurological:     General: No focal deficit present.     Mental Status: She is alert and oriented to person, place, and time. Mental status is at baseline.  Psychiatric:        Mood and Affect: Mood normal.        Behavior: Behavior normal.        Thought Content: Thought content normal.        Judgment: Judgment normal.     BP 125/85   Pulse 100   Temp 97.8 F (36.6 C) (Temporal)   Resp 18   Ht 5' 2"  (1.575 m)   Wt 159 lb (72.1 kg)   SpO2 98%   BMI 29.08 kg/m  Wt Readings from Last 3 Encounters:  11/06/20 159 lb (72.1 kg)  10/26/20 166 lb (75.3 kg)  10/26/20 166 lb 6 oz (75.5 kg)    Health Maintenance Due  Topic Date Due  . URINE MICROALBUMIN  12/22/2020    There are no preventive care reminders to display for this patient.   Lab Results  Component Value Date   TSH 1.260 07/25/2019   Lab Results  Component Value Date   WBC 10.3 09/11/2020   HGB 12.9 09/11/2020   HCT 38.0 09/11/2020   MCV 87 09/11/2020   PLT 342 09/11/2020   Lab Results  Component Value Date   NA 145 (H) 09/11/2020   K 3.8 09/11/2020   CO2 28 09/11/2020    GLUCOSE 79 09/11/2020   BUN 12 09/11/2020   CREATININE 0.85 09/11/2020   BILITOT 0.2 09/11/2020   ALKPHOS 127 (H) 09/11/2020   AST 34 09/11/2020   ALT 53 (H) 09/11/2020   PROT 6.7 09/11/2020   ALBUMIN 3.9 09/11/2020   CALCIUM 9.1 09/11/2020   ANIONGAP 9 05/07/2020   GFR 96.19 08/16/2019   Lab Results  Component Value Date   CHOL 250 (H) 02/02/2020   Lab Results  Component Value Date   HDL 40 02/02/2020   Lab Results  Component Value Date   LDLCALC 179 (H) 02/02/2020   Lab Results  Component Value Date   TRIG 167 (H) 02/02/2020   Lab Results  Component Value Date   CHOLHDL 6.3 (H) 02/02/2020   Lab Results  Component Value Date   HGBA1C 6.4 (A) 10/26/2020       Assessment & Plan:   Problem List Items Addressed This Visit      Endocrine   Type 2 diabetes mellitus with hyperglycemia, with long-term current use of insulin (HCC)   Relevant Medications   glucose blood (ONETOUCH VERIO) test strip    Other Visit Diagnoses    Urinary tract infection without hematuria, site unspecified    -  Primary   Relevant Orders   POCT URINALYSIS DIP (  CLINITEK) (Completed)   Urine Culture       Meds ordered this encounter  Medications  . glucose blood (ONETOUCH VERIO) test strip    Sig: Check blood glucose 3-4 x day. Dx E11.9, E11.649, z79.4,    Dispense:  100 each    Refill:  12   PLAN  poct ua shows wbc. Given farxiga use, will treat as uti.  Culture sent  Refill test strips  Discussed supportive care  Patient encouraged to call clinic with any questions, comments, or concerns.   Maximiano Coss, NP

## 2020-11-07 ENCOUNTER — Telehealth (INDEPENDENT_AMBULATORY_CARE_PROVIDER_SITE_OTHER): Payer: Medicare Other | Admitting: Family Medicine

## 2020-11-07 ENCOUNTER — Encounter: Payer: Self-pay | Admitting: Registered Nurse

## 2020-11-07 ENCOUNTER — Telehealth: Payer: Self-pay | Admitting: Registered Nurse

## 2020-11-07 ENCOUNTER — Ambulatory Visit: Payer: Self-pay | Admitting: *Deleted

## 2020-11-07 ENCOUNTER — Encounter: Payer: Self-pay | Admitting: Family Medicine

## 2020-11-07 ENCOUNTER — Other Ambulatory Visit: Payer: Self-pay

## 2020-11-07 ENCOUNTER — Other Ambulatory Visit: Payer: Self-pay | Admitting: Registered Nurse

## 2020-11-07 VITALS — Ht 62.0 in | Wt 159.0 lb

## 2020-11-07 DIAGNOSIS — N390 Urinary tract infection, site not specified: Secondary | ICD-10-CM

## 2020-11-07 DIAGNOSIS — T7840XA Allergy, unspecified, initial encounter: Secondary | ICD-10-CM

## 2020-11-07 MED ORDER — NITROFURANTOIN MONOHYD MACRO 100 MG PO CAPS: 100.0000 mg | ORAL_CAPSULE | Freq: Two times a day (BID) | ORAL | 0 refills | Status: AC

## 2020-11-07 MED ORDER — NITROFURANTOIN MONOHYD MACRO 100 MG PO CAPS: 100.0000 mg | ORAL_CAPSULE | Freq: Two times a day (BID) | ORAL | 0 refills | Status: DC

## 2020-11-07 NOTE — Telephone Encounter (Signed)
Noted. Attempted to call pt to notify no answer.

## 2020-11-07 NOTE — Telephone Encounter (Addendum)
Called patient back to assess symptoms after patient took Benadryl 25 mg . No answer. Left message that appt scheduled for today at 4:00 pm.  Left message if she can not make scheduled appt to call and cancel.   C/o allergic reaction to new antibiotic Bactrim DS. 1 st dose was last night and 2 nd dose this am at 9:00. Reports she started itching all over body and rash noted. Denies fever, difficulty breathing , facial swelling or facial itching, difficulty swallowing. Patient was awaiting call back from PCP and no call noted. Patient took benadryl 25 mg at 1:28 pm. Patient would like a call back from PCP concerning itching and to prescribe a different antibiotic. Please call patient back at  [phone]. Care advise given. Patient verbalized understanding of care advise and to call back or go to ED or call 911 if symptoms worsen. Called office to schedule appt for today at 4:00pm.  Reason for Disposition . Taking prescription medication that could cause itching (e.g., codeine/morphine/other opiates, aspirin)  Answer Assessment - Initial Assessment Questions 1. MAIN SYMPTOM: "What is your main symptom?" "How bad is it?"       Severe itching from Bactrim DS. Itching and rash to body and extremities. 2. RESPIRATORY STATUS: "Are you having difficulty breathing?"  (e.g., yes/no, wheezing, unable to complete a sentence)      Denies  3. SWALLOWING: "Can you swallow?" (e.g., yes/no, food, fluid, saliva)      yes 4. VASCULAR STATUS: "Are you feeling weak?" If Yes, ask: "Can you stand and walk normally?"     no 5. ONSET: "When did the reaction start?" (Minutes or hours ago)      After taking dose at 900 am .  6. SUBSTANCE: "What are you reacting to?" "When did the contact occur?"      Antibiotic. Took 2 doses thus far last night and this morning 7. PREVIOUS REACTION: "Have you ever reacted to it before?" If Yes, ask: "What happened that time?"     codeine  8. EPINEPHRINE: "Do you have an epinephrine  autoinjector (e.g., EpiPen)?"     no  Answer Assessment - Initial Assessment Questions 1. DESCRIPTION: "Describe the itching you are having."     All over body expect face and head  2. SEVERITY: "How bad is it?"    - MILD - doesn't interfere with normal activities   - MODERATE-SEVERE: interferes with work, school, sleep, or other activities      Moderate to severe 3. SCRATCHING: "Are there any scratch marks? Bleeding?"     na 4. ONSET: "When did this begin?"       Today  5. CAUSE: "What do you think is causing the itching?" (ask about swimming pools, pollen, animals, soaps, etc.)     Bactrim DS 6. OTHER SYMPTOMS: "Do you have any other symptoms?"      Rash  7. PREGNANCY: "Is there any chance you are pregnant?" "When was your last menstrual period?"     na  Protocols used: ITCHING [facility], ANAPHYLAXIS-A-AH

## 2020-11-07 NOTE — Patient Instructions (Addendum)
Drug Allergy A drug allergy is when your body reacts in a bad way to a medicine. The reaction may be mild or very bad. In some cases, it can be life-threatening. If you have an allergic reaction, get help right away. You should get help even if the reaction seems mild. What are the causes? This condition is caused by a reaction in your body's defense system (immune system). The system sees a medicine as being harmful when it is not. What are the signs or symptoms? Symptoms of a mild reaction  A stuffy nose (nasal congestion).  Tingling in your mouth.  An itchy, red rash. Symptoms of a very bad reaction  Swelling of your eyes, lips, face, or tongue.  Swelling of the back of your mouth and your throat.  Breathing loudly (wheezing).  A hoarse voice.  Itchy, red, swollen areas of skin (hives).  Feeling dizzy or light-headed.  Passing out (fainting).  Feeling worried or nervous (anxiety).  Feeling mixed up (confused).  Pain in your belly (abdomen).  Trouble with breathing, talking, or swallowing.  A tight feeling in your chest.  Fast or uneven heartbeats (palpitations).  Throwing up (vomiting).  Watery poop (diarrhea). How is this treated? There is no cure for allergies. An allergic reaction can be treated with:  Medicines to help your symptoms.  Medicines that you breathe into your lungs (respiratory inhalers).  An allergy shot (epinephrine injection). For a very bad reaction, you may need to stay in the hospital. Your doctor may teach you how to use an allergy kit (anaphylaxis kit) and how to give yourself an allergy shot. You can give yourself an allergy shot with what is called an auto-injector "pen." Follow these instructions at home: If you have a very bad allergy:  Always keep an auto-injector pen or your allergy kit with you. These could save your life. Use them as told by your doctor.  Make sure that you, the people who live with you, and your  employer know: ? How to use your allergy kit. ? How to use an auto-injector pen to give you an allergy shot.  If you used your auto-injector pen: ? Get more medicine for it right away. This is important in case you have another reaction. ? Get help right away.  Wear a medical alert bracelet or necklace that says you have an allergy, if your doctor tells you to do this.   General instructions  Avoid medicines that you are allergic to.  Take over-the-counter and prescription medicines only as told by your doctor.  Do not drive until your doctor says it is safe.  If you have itchy, red, swollen areas of skin or a rash: ? Use an over-the-counter medicine (antihistamine) as told by your doctor. ? Put cold, wet cloths (cold compresses) on your skin. ? Take baths or showers in cool water. Avoid hot water.  If you had tests done, it is up to you to get your test results. Ask your doctor when your results will be ready.  Tell any doctors who care for you that you have a drug allergy.  Keep all follow-up visits as told by your doctor. This is important. Contact a doctor if:  You think that you are having a mild allergic reaction.  You have symptoms that last more than 2 days after your reaction.  Your symptoms get worse.  You get new symptoms. Get help right away if:  You had to use your auto-injector pen. You  must go to the emergency room, even if the medicine seems to be working.  You have symptoms of a very bad allergic reaction. These symptoms may be an emergency. Do not wait to see if the symptoms will go away. Use your auto-injector pen or allergy kit as you have been told. Get medical help right away. Call your local emergency services (911 in the [place] Do not drive yourself to the hospital. Summary  A drug allergy is when your body reacts in a bad way to a medicine.  Take medicines only as told by your doctor.  Tell any doctors who care for you that you have a drug  allergy.  Always keep an auto-injector pen or your allergy kit with you if you have a very bad allergy. This information is not intended to replace advice given to you by your health care provider. Make sure you discuss any questions you have with your health care provider. Document Revised: 03/10/2018 Document Reviewed: 03/10/2018 Elsevier Patient Education  2021 [employer].    If you have lab work done today you will be contacted with your lab results within the next 2 weeks.  If you have not heard from [place] then please contact us. The fastest way to get your results is to register for My Chart.   IF you received an x-ray today, you will receive an invoice from [facility]. Please contact [facility] at [phone] with questions or concerns regarding your invoice.   IF you received labwork today, you will receive an invoice from [facility]. Please contact [facility] at [phone] with questions or concerns regarding your invoice.   Our billing staff will not be able to assist you with questions regarding bills from these companies.  You will be contacted with the lab results as soon as they are available. The fastest way to get your results is to activate your My Chart account. Instructions are located on the last page of this paperwork. If you have not heard from [place] regarding the results in 2 weeks, please contact this office.

## 2020-11-07 NOTE — Telephone Encounter (Signed)
Pt was seen yesterday and is having an allergic reaction to the antibiotic that was sent in. Please advise on an alternative.

## 2020-11-07 NOTE — Telephone Encounter (Signed)
Have sent Macrobid 157m PO bid for 5 days.  Thanks,  RDenice Paradise

## 2020-11-07 NOTE — Telephone Encounter (Signed)
Patient is calling to report allergic reaction to the sulfamethoxazole-trimethoprim (BACTRIM DS) 800-160 MG tablet [[id number]]  It is causing her to itch all over .     Patient is asking that provider call in something else.  [id number] [place] [id number] Phone:  [phone]:  [phone]

## 2020-11-07 NOTE — Telephone Encounter (Signed)
FYI

## 2020-11-07 NOTE — Progress Notes (Signed)
Virtual Visit Note  I connected with patient on 11/07/20 at 1643 by telephone due to unable to work Epic video visit and verified that I am speaking with the correct person using two identifiers. Beverly Ramirez is currently located at home and no family members are currently with them during visit. The provider, Laurita Quint Daviana Haymaker, FNP is located in their office at time of visit.  I discussed the limitations, risks, security and privacy concerns of performing an evaluation and management service by telephone and the availability of in person appointments. I also discussed with the patient that there may be a patient responsible charge related to this service. The patient expressed understanding and agreed to proceed.   I provided 20 minutes of non-face-to-face time during this encounter.  Chief Complaint  Patient presents with  . Allergic Reaction    To antibiotic bactrim for recent uti/ new rx has already been sent to pharmacy      HPI ? Yesterday was diagnosed with a UTI in clinic Went home took a dose felt some slight itching This morning took a second dose and the itching came right back No prior history of allergic reactions Was told to stop this medication and start Macrobid bid x 5 days She is a diabetic Took Benadryl and itching has resolved Denies a rash Denies SOB, wheezing, lip swelling    Allergies  Allergen Reactions  . Sulfa Antibiotics Hives  . Sulfamethoxazole-Trimethoprim Hives and Itching  . Codeine Itching, Rash and Hives  . Latex Hives    With the power    Prior to Admission medications   Medication Sig Start Date End Date Taking? Authorizing Provider  albuterol (VENTOLIN HFA) 108 (90 Base) MCG/ACT inhaler Inhale 2 puffs into the lungs every 6 (six) hours as needed for wheezing or shortness of breath. 03/13/20  Yes Jacelyn Pi, Lilia Argue, MD  amLODipine (NORVASC) 5 MG tablet TAKE 1 TABLET BY MOUTH EVERY DAY 06/08/20  Yes Jacelyn Pi, Lilia Argue, MD   atorvastatin (LIPITOR) 40 MG tablet Take 1 tablet (40 mg total) by mouth daily. 02/10/20  Yes Jacelyn Pi, Lilia Argue, MD  dapagliflozin propanediol (FARXIGA) 10 MG TABS tablet Take 1 tablet (10 mg total) by mouth daily. 10/26/20  Yes Shamleffer, Melanie Crazier, MD  dicyclomine (BENTYL) 20 MG tablet Take 1 tablet (20 mg total) by mouth 3 (three) times daily as needed for spasms (abdominal cramping). 10/18/20  Yes Wendie Agreste, MD  diphenoxylate-atropine (LOMOTIL) 2.5-0.025 MG tablet Take 1 tablet by mouth 4 (four) times daily as needed for diarrhea or loose stools. 04/27/20  Yes Gatha Mayer, MD  DULoxetine (CYMBALTA) 60 MG capsule TAKE 1 CAPSULE BY MOUTH EVERY DAY 03/20/20  Yes Jacelyn Pi, Lilia Argue, MD  esomeprazole (NEXIUM) 40 MG capsule TAKE 1 CAPSULE BY MOUTH EVERY DAY BEFORE BREAKFAST 07/20/20  Yes Gatha Mayer, MD  fluticasone [facility]) 50 MCG/ACT nasal spray SPRAY 2 SPRAYS INTO EACH NOSTRIL EVERY DAY Patient taking differently: Patient takes as needed 03/08/20  Yes Jacelyn Pi, Irma M, MD  glucose blood [facility] VERIO) test strip Check blood glucose 3-4 x day. Dx E11.9, E11.649, z79.4, 11/06/20  Yes Maximiano Coss, NP  insulin aspart (NOVOLOG FLEXPEN) 100 UNIT/ML FlexPen Inject 22 Units into the skin 3 (three) times daily with meals. 07/23/20  Yes Shamleffer, Melanie Crazier, MD  Insulin Glargine (BASAGLAR KWIKPEN) 100 UNIT/ML Inject 32 Units into the skin at bedtime. 07/23/20  Yes Shamleffer, Melanie Crazier, MD  Insulin Pen Needle 32G  X 4 MM MISC 1 Device by Does not apply route in the morning, at noon, in the evening, and at bedtime. 07/23/20  Yes Shamleffer, Melanie Crazier, MD  Lancets [facility] DELICA PLUS [id number]) [place] 1 each by Does not apply route 3 (three) times daily. Dx E11.65, z79.4 01/24/19  Yes Jacelyn Pi, Lilia Argue, MD  lidocaine (XYLOCAINE) 2 % solution Use as directed 15 mLs in the mouth or throat as needed for mouth pain. 03/09/20  Yes Gatha Mayer, MD  mesalamine  (LIALDA) 1.2 g EC tablet Take 2 tablets (2.4 g total) by mouth 2 (two) times daily. 04/28/20 04/23/21 Yes Gatha Mayer, MD  methocarbamol (ROBAXIN) 500 MG tablet Take 1 tablet (500 mg total) by mouth every 8 (eight) hours as needed for muscle spasms. 05/31/20  Yes Jacelyn Pi, Irma M, MD  montelukast (SINGULAIR) 10 MG tablet TAKE 1 TABLET BY MOUTH EVERYDAY AT BEDTIME 05/27/20  Yes Maximiano Coss, NP  Multiple Vitamins-Minerals (WOMENS 50+ [place] VITAMIN/MIN PO) Take by mouth.   Yes [provider]  NONFORMULARY OR COMPOUNDED ITEM Swish and spit 5 mLs as needed (ulcer flare up in mouth). Magic mouthwash with lidocaine disp#150 ml 05/04/20  Yes Gatha Mayer, MD  ondansetron (ZOFRAN ODT) 4 MG disintegrating tablet Take 1 tablet (4 mg total) by mouth every 8 (eight) hours as needed for nausea or vomiting. 05/08/20  Yes Long, Wonda Olds, MD  triamcinolone cream (KENALOG) 0.1 % Apply 1 application topically 2 (two) times daily. 04/19/20  Yes Jacelyn Pi, Lilia Argue, MD  nitrofurantoin, macrocrystal-monohydrate, (MACROBID) 100 MG capsule Take 1 capsule (100 mg total) by mouth 2 (two) times daily for 5 days. Patient not taking: Reported on 11/07/2020 11/07/20 11/12/20  Maximiano Coss, NP    Past Medical History:  Diagnosis Date  . Anemia   . Carpal tunnel syndrome    bilateral  . Chronic heel pain   . Diabetes mellitus without complication ([place])   . DJD (degenerative joint disease) of cervical spine    MRI 2017  . Hyperlipidemia   . Plantar fasciitis    bilateral  . Sickle cell anemia (HCC)    sickle cell trait  . Sleep apnea   . Tendonitis    left hand/wrist  . UC (ulcerative colitis) ([place])     Past Surgical History:  Procedure Laterality Date  . ABDOMINAL HYSTERECTOMY    . APPENDECTOMY    . CESAREAN SECTION     x4  . COLONOSCOPY     Multiple in [place]  . ESOPHAGOGASTRODUODENOSCOPY      Social History   Tobacco Use  . Smoking status: Former Smoker    Types: Cigarettes     Quit date: 10/13/2006    Years since quitting: 14.0  . Smokeless tobacco: Never Used  Substance Use Topics  . Alcohol use: Yes    Comment: occasional    Family History  Problem Relation Age of Onset  . Stroke Mother   . Diabetes Mother   . Hypertension Mother   . Lung cancer Mother   . Sickle cell trait Mother   . Diabetes Maternal Aunt   . Lung cancer Maternal Aunt   . Lung cancer Maternal Aunt   . Lung cancer Maternal Aunt   . Colon cancer Cousin 87  . Sickle cell trait Father   . Sickle cell anemia Sister 55  . Lung cancer Maternal Grandmother   . Sickle cell anemia Sister   . Other Sister  accident and blood clot formed    Review of Systems  Constitutional: Negative for chills, diaphoresis, fever and weight loss.  Respiratory: Negative for cough, sputum production, shortness of breath and wheezing.   Cardiovascular: Negative for chest pain, palpitations and orthopnea.  Genitourinary: Negative for dysuria, flank pain, frequency, hematuria and urgency.  Musculoskeletal: Negative for back pain, joint pain and myalgias.  Skin: Positive for itching and rash.    Objective  Constitutional:      General: Not in acute distress.    Appearance: Normal appearance. Not ill-appearing.   Pulmonary:     Effort: Pulmonary effort is normal. No respiratory distress.  Neurological:     Mental Status: Alert and oriented to person, place, and time.  Psychiatric:        Mood and Affect: Mood normal.        Behavior: Behavior normal.     ASSESSMENT and PLAN  Problem List Items Addressed This Visit   None   Visit Diagnoses    Allergic reaction, initial encounter    -  Primary   Urinary tract infection without hematuria, site unspecified       Relevant Medications   nitrofurantoin, macrocrystal-monohydrate, (MACROBID) 100 MG capsule      Plan . Stop bactrim, start macrobid . Use benadryl as needed for itching . Strict RTC/ Ed precautions provided  Return if symptoms  worsen or fail to improve.    The above assessment and management plan was discussed with the patient. The patient verbalized understanding of and has agreed to the management plan. Patient is aware to call the clinic if symptoms persist or worsen. Patient is aware when to return to the clinic for a follow-up visit. Patient educated on when it is appropriate to go to the emergency department.     Huston Foley Koula Venier, FNP-BC [facility] at [facility] [place], [place] [address] Ph.  [phone] Fax [phone]

## 2020-11-08 LAB — URINE CULTURE

## 2020-11-12 ENCOUNTER — Ambulatory Visit: Payer: Medicare Other | Admitting: Physical Therapy

## 2020-11-19 ENCOUNTER — Other Ambulatory Visit: Payer: Self-pay

## 2020-11-19 ENCOUNTER — Ambulatory Visit: Payer: Medicare Other | Attending: Otolaryngology | Admitting: Physical Therapy

## 2020-11-19 ENCOUNTER — Encounter: Payer: Self-pay | Admitting: Physical Therapy

## 2020-11-19 DIAGNOSIS — R262 Difficulty in walking, not elsewhere classified: Secondary | ICD-10-CM | POA: Insufficient documentation

## 2020-11-19 DIAGNOSIS — R2681 Unsteadiness on feet: Secondary | ICD-10-CM | POA: Diagnosis not present

## 2020-11-19 DIAGNOSIS — R42 Dizziness and giddiness: Secondary | ICD-10-CM | POA: Diagnosis not present

## 2020-11-19 NOTE — Therapy (Signed)
[facility] [address] [place], [place], [address] Phone: [phone]   Fax:  [phone]  Physical Therapy Treatment  Patient Details  Name: Beverly Ramirez MRN: [id number] Date of Birth: 1961/09/07 Referring Provider (PT): Leta Baptist, MD   Encounter Date: 11/19/2020   PT End of Session - 11/19/20 1313    Visit Number 5    Number of Visits 7    Date for PT Re-Evaluation 11/23/20    Authorization Type Medicare; 10th visit PN    Progress Note Due on Visit 10    PT Start Time 1233    PT Stop Time 1312    PT Time Calculation (min) 39 min    Activity Tolerance Patient tolerated treatment well    Behavior During Therapy [facility] for tasks assessed/performed           Past Medical History:  Diagnosis Date  . Anemia   . Carpal tunnel syndrome    bilateral  . Chronic heel pain   . Diabetes mellitus without complication ([place])   . DJD (degenerative joint disease) of cervical spine    MRI 2017  . Hyperlipidemia   . Plantar fasciitis    bilateral  . Sickle cell anemia (HCC)    sickle cell trait  . Sleep apnea   . Tendonitis    left hand/wrist  . UC (ulcerative colitis) ([place])     Past Surgical History:  Procedure Laterality Date  . ABDOMINAL HYSTERECTOMY    . APPENDECTOMY    . CESAREAN SECTION     x4  . COLONOSCOPY     Multiple in [place]  . ESOPHAGOGASTRODUODENOSCOPY      There were no vitals filed for this visit.   Subjective Assessment - 11/19/20 1235    Subjective Pt states she had a rough week.  She had to go see the dentist due to a cracked tooth.  No reports of dizziness or flare up within the last week.    Pertinent History ulcerative colitis, HLD, HTN, DM, De Quervain's tenosynovitis, bilat carpal tunnel, plantar fasciitis, fibromyalgia, sickle cell anemia, OSA    Limitations Standing;Walking;House hold activities    Patient Stated Goals To just stop spinning.                               Vestibular Treatment/Exercise - 11/19/20 1407      Vestibular Treatment/Exercise   Vestibular Treatment Provided Gaze    Habituation Exercises 180 degree Turns    Gaze Exercises X1 Viewing Horizontal;X1 Viewing Vertical      180 degree Turns   Number of Reps  10    Symptom Description  to L and R moving along 30 feet path in gym with bending down passing ball around legs figure 8; added looking at lifted ball overhead between truns.  Cecille Amsterdam produced significant instability, min assist to correct balance      X1 Viewing Horizontal   Foot Position feet together, feet together on pillow    Reps 2    Comments 2x30 feet together on floor; 1x30 and 1x60 sec feet together pillow.  Mild sway noted, supervision      X1 Viewing Vertical   Foot Position feet together, feet together on pillow    Reps 2    Comments x30sec feet together on floor; x30 and x60 sec feet together pillow.  Mild sway noted, supervision  Balance Exercises - 11/19/20 1413      Balance Exercises: Standing   Rockerboard Anterior/posterior;Head turns;5 reps;Intermittent UE support;Limitations   weightshifting to establish balance point; horizontal and vertical head turns.  Intermittent use of UE support and min assist due to LOB.  Vertical was more challenging than horizontal.   Marching Solid surface;Dynamic;Forwards;Head turns   x4 30 ft.  Therapist walked behind and placed ball in field of vision, pt had to turn head to find ball and return to therapist hand.            PT Education - 11/19/20 1313    Education Details Progressed HEP    Person(s) Educated Patient    Methods Explanation;Handout    Comprehension Verbalized understanding            PT Short Term Goals - 10/23/20 2115      PT SHORT TERM GOAL #1   Title Pt will participate in further assessment of falls risk during dynamic gait with FGA    Time 3    Period Weeks    Status  Achieved    Target Date 10/30/20      PT SHORT TERM GOAL #2   Title Pt will initiate vestibular, balance and neck HEP    Time 3    Period Weeks    Status Achieved    Target Date 10/30/20             PT Long Term Goals - 10/17/20 1530      PT LONG TERM GOAL #1   Title Pt will report improvement on DFS to 59% and 5 point increase in DPS    Baseline DFS: 58%, DPS: 55.3    Time 6    Period Weeks    Status New    Target Date 11/23/20      PT LONG TERM GOAL #2   Title Pt will demonstrate independence with final vestibular, balance and neck HEP    Time 6    Period Weeks    Status New    Target Date 11/23/20      PT LONG TERM GOAL #3   Title Pt will demonstrate 20 deg increase in neck flexion/extension and 10 deg increase in lateral flexion and rotation bilaterally and will report 50% reduction in HA    Time 6    Period Weeks    Status New    Target Date 11/23/20      PT LONG TERM GOAL #4   Title Pt will report 75% reduction in dizziness with supine <> sit, quick head/body turns, bending forwards and when driving    Time 6    Period Weeks    Status New    Target Date 11/23/20      PT LONG TERM GOAL #5   Title Pt will demonstrate 4 point improvement in FGA to indicate decreased falls risk    Baseline 24/30    Time 6    Period Weeks    Status New    Target Date 11/23/20                 Plan - 11/19/20 1318    Clinical Impression Statement Progressed VOR exercise to include narrow BOS with mild sway.  Introduced exercises on rockerboard.  Vertical head turns produced most instability and required min assist to regain balance.  Will continue to progress dynamic balance to continue to improve functional balance.  She tolerated all well with no reports of dizziness but  still displays postural sway and tends to stiffen body as exercises become more challenging.    Personal Factors and Comorbidities Comorbidity 3+;Past/Current Experience    Comorbidities ulcerative  colitis, HLD, HTN, DM, De Quervain's tenosynovitis, bilat carpal tunnel, plantar fasciitis, fibromyalgia, sickle cell anemia, OSA    Examination-Activity Limitations Bed Mobility;Bend;Locomotion Level;Reach Overhead    Examination-Participation Restrictions Community Activity;Driving    PT Frequency 1x / week    PT Duration 6 weeks    PT Treatment/Interventions ADLs/Self Care Home Management;Canalith Repostioning;Cryotherapy;Moist Heat;Gait training;Stair training;Functional mobility training;Therapeutic activities;Therapeutic exercise;Balance training;Neuromuscular re-education;Patient/family education;Manual techniques;Passive range of motion;Dry needling;Vestibular;Taping    PT Next Visit Plan LTG? Progress x1 compliant staggered.  Rockerboard weightshifts, head turns, a/p and m/l.  Figure 8 ball around legs with turns-progress to compliant surface and looking up.  Marches with prolonged single leg stance time, walking backwards with head turns following ball.    Consulted and Agree with Plan of Care Patient           Patient will benefit from skilled therapeutic intervention in order to improve the following deficits and impairments:  Decreased balance,Decreased range of motion,Difficulty walking,Dizziness,Pain  Visit Diagnosis: Dizziness and giddiness  Unsteadiness on feet     Problem List Patient Active Problem List   Diagnosis Date Noted  . Type 2 diabetes mellitus with hyperglycemia, with long-term current use of insulin ([place]) 05/31/2020  . Fibromyalgia 05/31/2020  . Mouth ulcers 05/25/2020  . Elevated glucose 12/23/2019  . Diabetic ketoacidosis without coma associated with type 2 diabetes mellitus ([place]) 12/23/2019  . De Quervain's tenosynovitis, left 11/16/2018  . Bilateral carpal tunnel syndrome 04/13/2018  . Leukocytosis 10/04/2017  . Hyperlipidemia 08/04/2017  . Ulcerative colitis with complication ([place]) 60/60/0459  . Plantar fasciitis 05/28/2017    Yetta Numbers,  SPT 11/19/2020, 4:55 PM  [facility] [address] [place], [place], [address] Phone: [phone]   Fax:  [phone]  Name: Mehak Roskelley MRN: [id number] Date of Birth: [birth year]

## 2020-11-19 NOTE — Patient Instructions (Addendum)
Gaze Stabilization: Standing Feet Together (Compliant Surface)    Feet together on pillow, keeping eyes on target on wall __3_ feet away, tilt head down 15-30 and move head side to side for __30__ seconds. Repeat while moving head up and down for __30__ seconds. Do __2__ sessions per day.    Feet Together (Compliant Surface) Head Motion - Eyes Closed    Stand on compliant surface: Pillow with feet close together.  Have a counter top/wall/couch behind you and a chair in front for support. Close eyes and move head slowly, up and down 10 times.  Regain your balance and then perform head left and right, slowly 10 times. Repeat 2 times per session. Do 2 sessions per day.   Feet Heel-Toe "Tandem", Head Motion - Eyes Closed    Place right foot slightly ahead of the left.  Close your eyes and maintain your balance for 20 seconds. Open eyes, switch feet, close eyes and hold your balance for 20 seconds Repeat _2___ times per session. Do _2___ sessions per day.     Feet Heel-Toe "Tandem"    Arms outstretched, walk a straight line bringing one foot directly in front of the other.  Turn your head and look to the right for 4-5 steps and then switch, look to the left for 4-5 steps.   Repeat for 4 laps in hallway per session. Do __2__ sessions per day.

## 2020-11-24 ENCOUNTER — Other Ambulatory Visit: Payer: Self-pay

## 2020-11-24 ENCOUNTER — Emergency Department (HOSPITAL_COMMUNITY): Payer: Medicare Other

## 2020-11-24 ENCOUNTER — Emergency Department (HOSPITAL_COMMUNITY)
Admission: EM | Admit: 2020-11-24 | Discharge: 2020-11-25 | Disposition: A | Discharge status: Home / Self Care | Payer: Medicare Other | Attending: Emergency Medicine | Admitting: Emergency Medicine

## 2020-11-24 DIAGNOSIS — M25552 Pain in left hip: Secondary | ICD-10-CM | POA: Insufficient documentation

## 2020-11-24 DIAGNOSIS — Z794 Long term (current) use of insulin: Secondary | ICD-10-CM | POA: Insufficient documentation

## 2020-11-24 DIAGNOSIS — M79662 Pain in left lower leg: Secondary | ICD-10-CM | POA: Diagnosis not present

## 2020-11-24 DIAGNOSIS — E119 Type 2 diabetes mellitus without complications: Secondary | ICD-10-CM | POA: Diagnosis not present

## 2020-11-24 DIAGNOSIS — M79606 Pain in leg, unspecified: Secondary | ICD-10-CM

## 2020-11-24 DIAGNOSIS — M79605 Pain in left leg: Secondary | ICD-10-CM | POA: Diagnosis not present

## 2020-11-24 DIAGNOSIS — Z87891 Personal history of nicotine dependence: Secondary | ICD-10-CM | POA: Insufficient documentation

## 2020-11-24 DIAGNOSIS — Z79899 Other long term (current) drug therapy: Secondary | ICD-10-CM | POA: Diagnosis not present

## 2020-11-24 DIAGNOSIS — Z91040 Latex allergy status: Secondary | ICD-10-CM | POA: Insufficient documentation

## 2020-11-24 DIAGNOSIS — Z7984 Long term (current) use of oral hypoglycemic drugs: Secondary | ICD-10-CM | POA: Insufficient documentation

## 2020-11-24 IMAGING — CR DG HIP (WITH OR WITHOUT PELVIS) 2-3V*L*
3 series · 3 of 3 positions shown · non-contrast
Comparison: None.

CLINICAL DATA: 59-year-old female with left lower extremity pain.

EXAM:
LEFT TIBIA AND FIBULA - 2 VIEW; DG HIP (WITH OR WITHOUT PELVIS) 2-3V
LEFT

[pelvis ap]
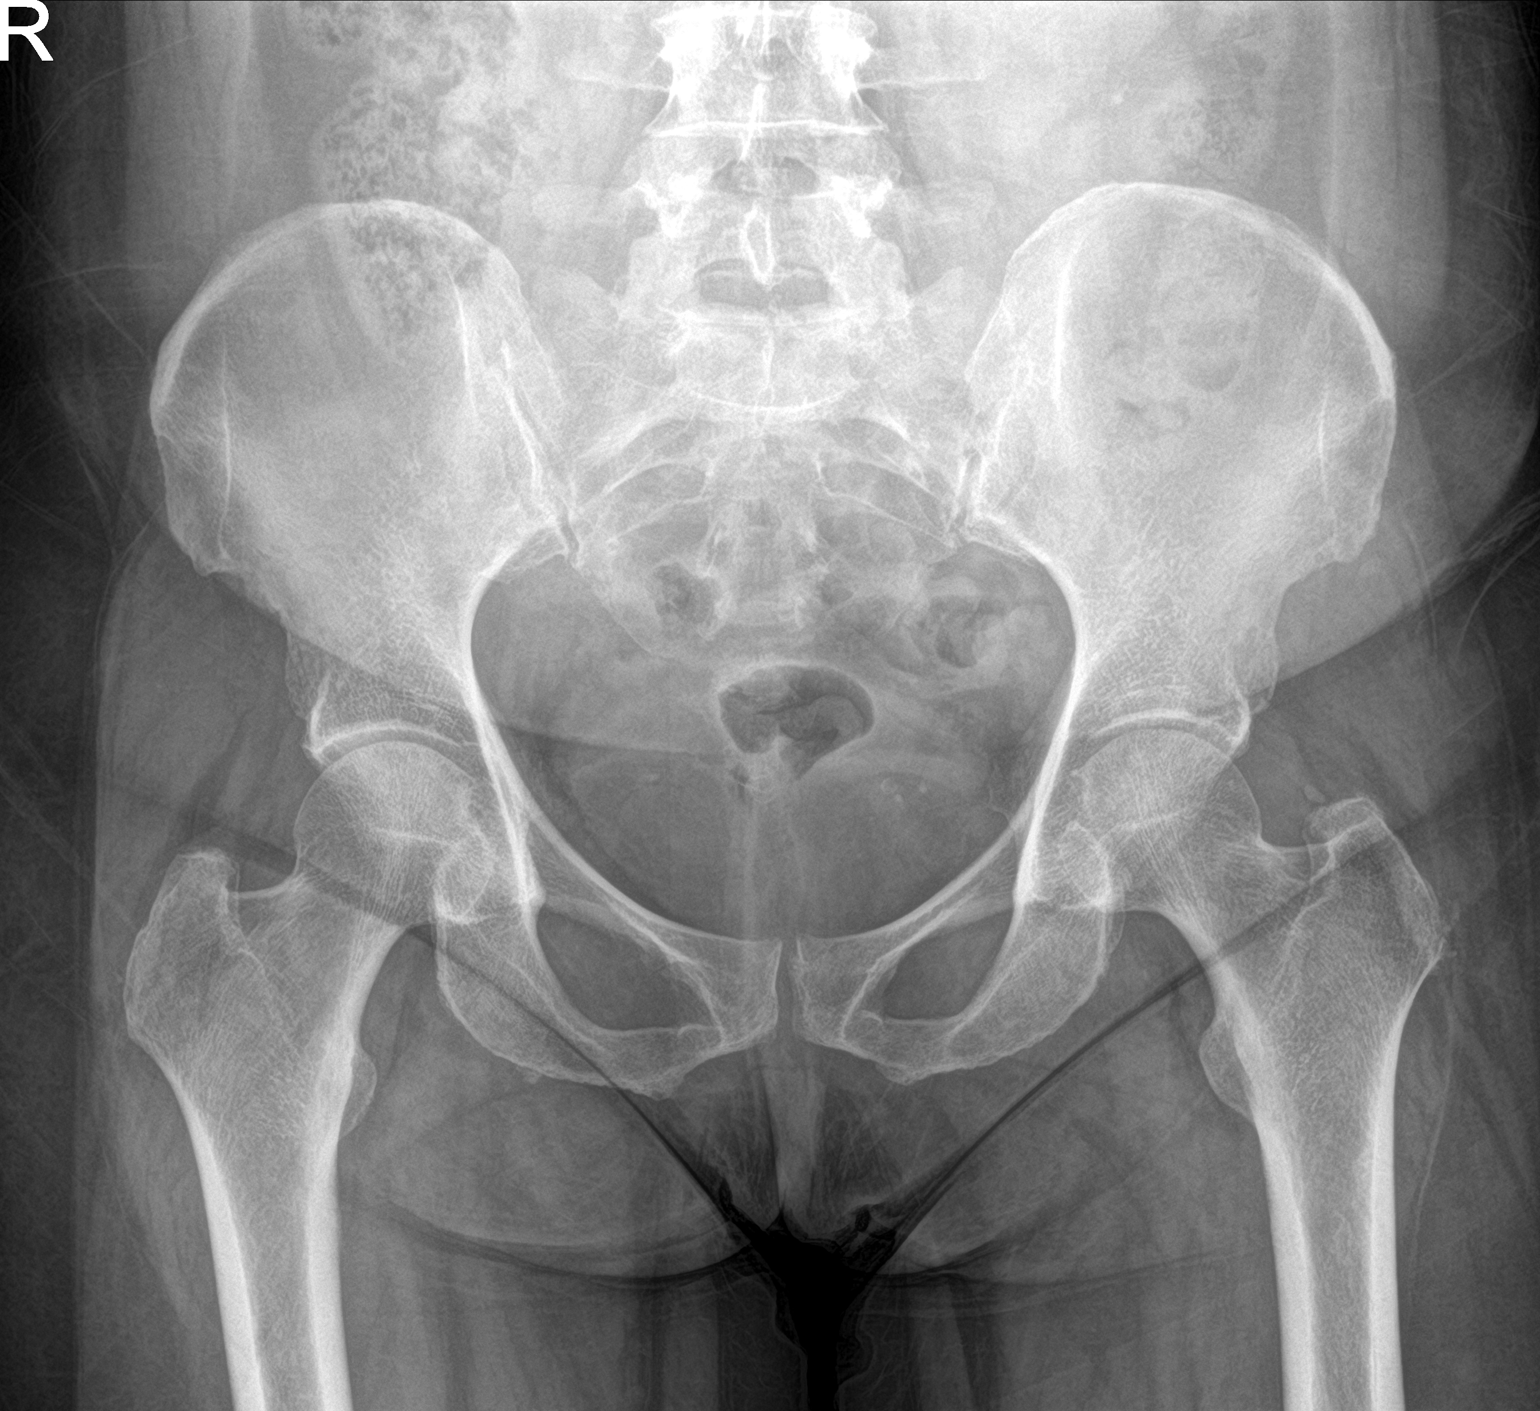

[hip ap]
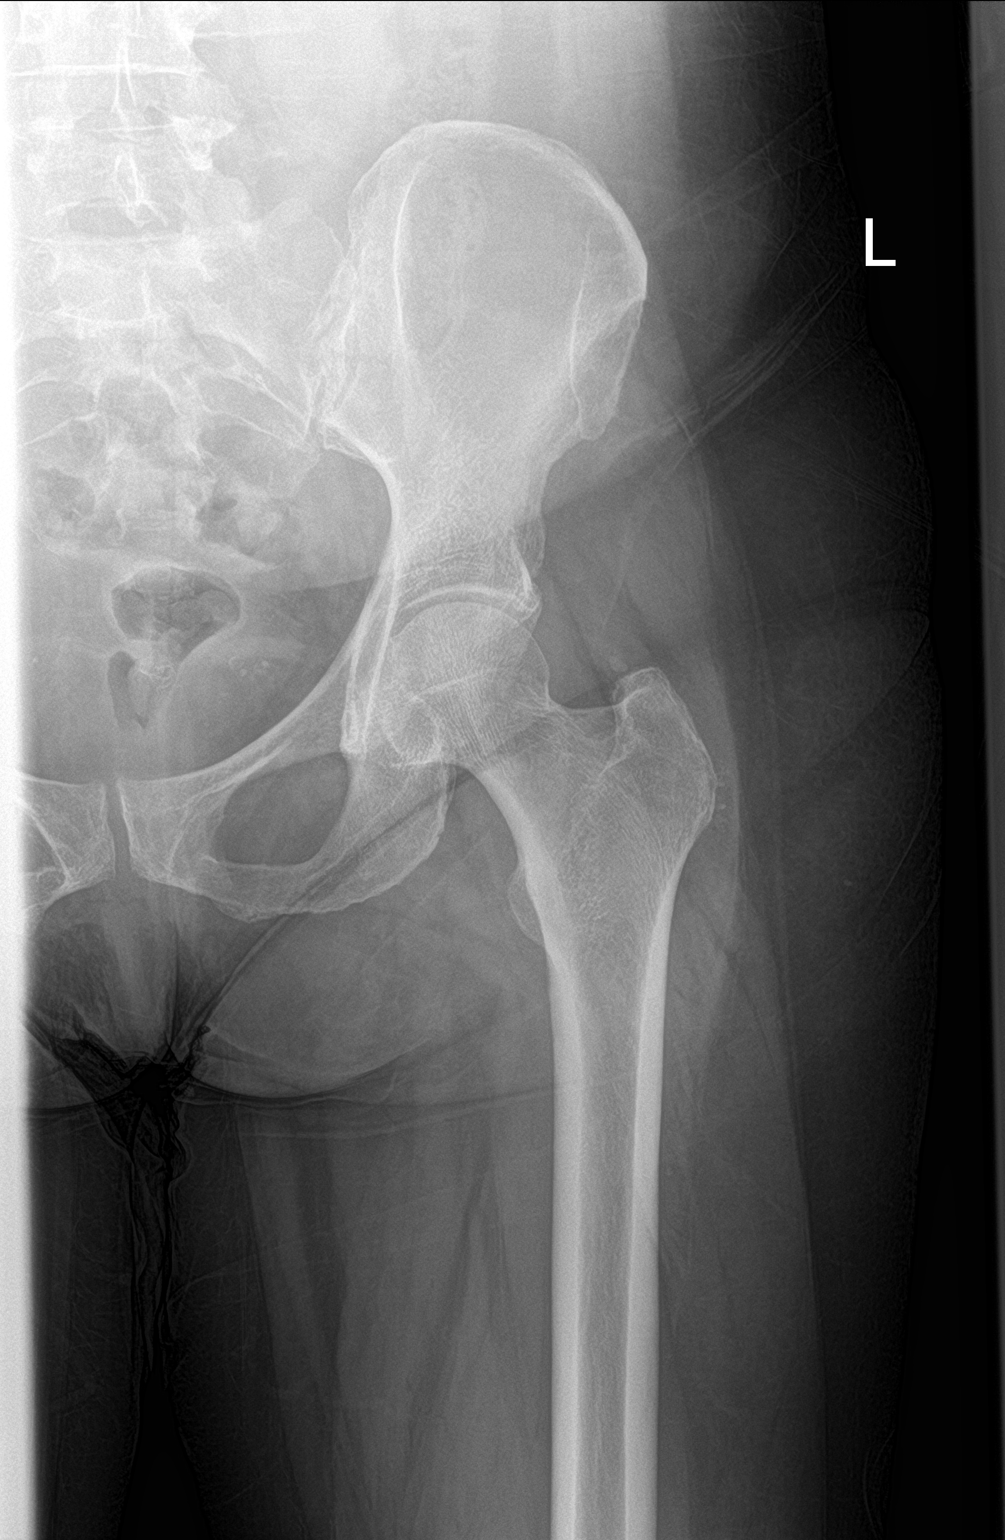

[hip lat]
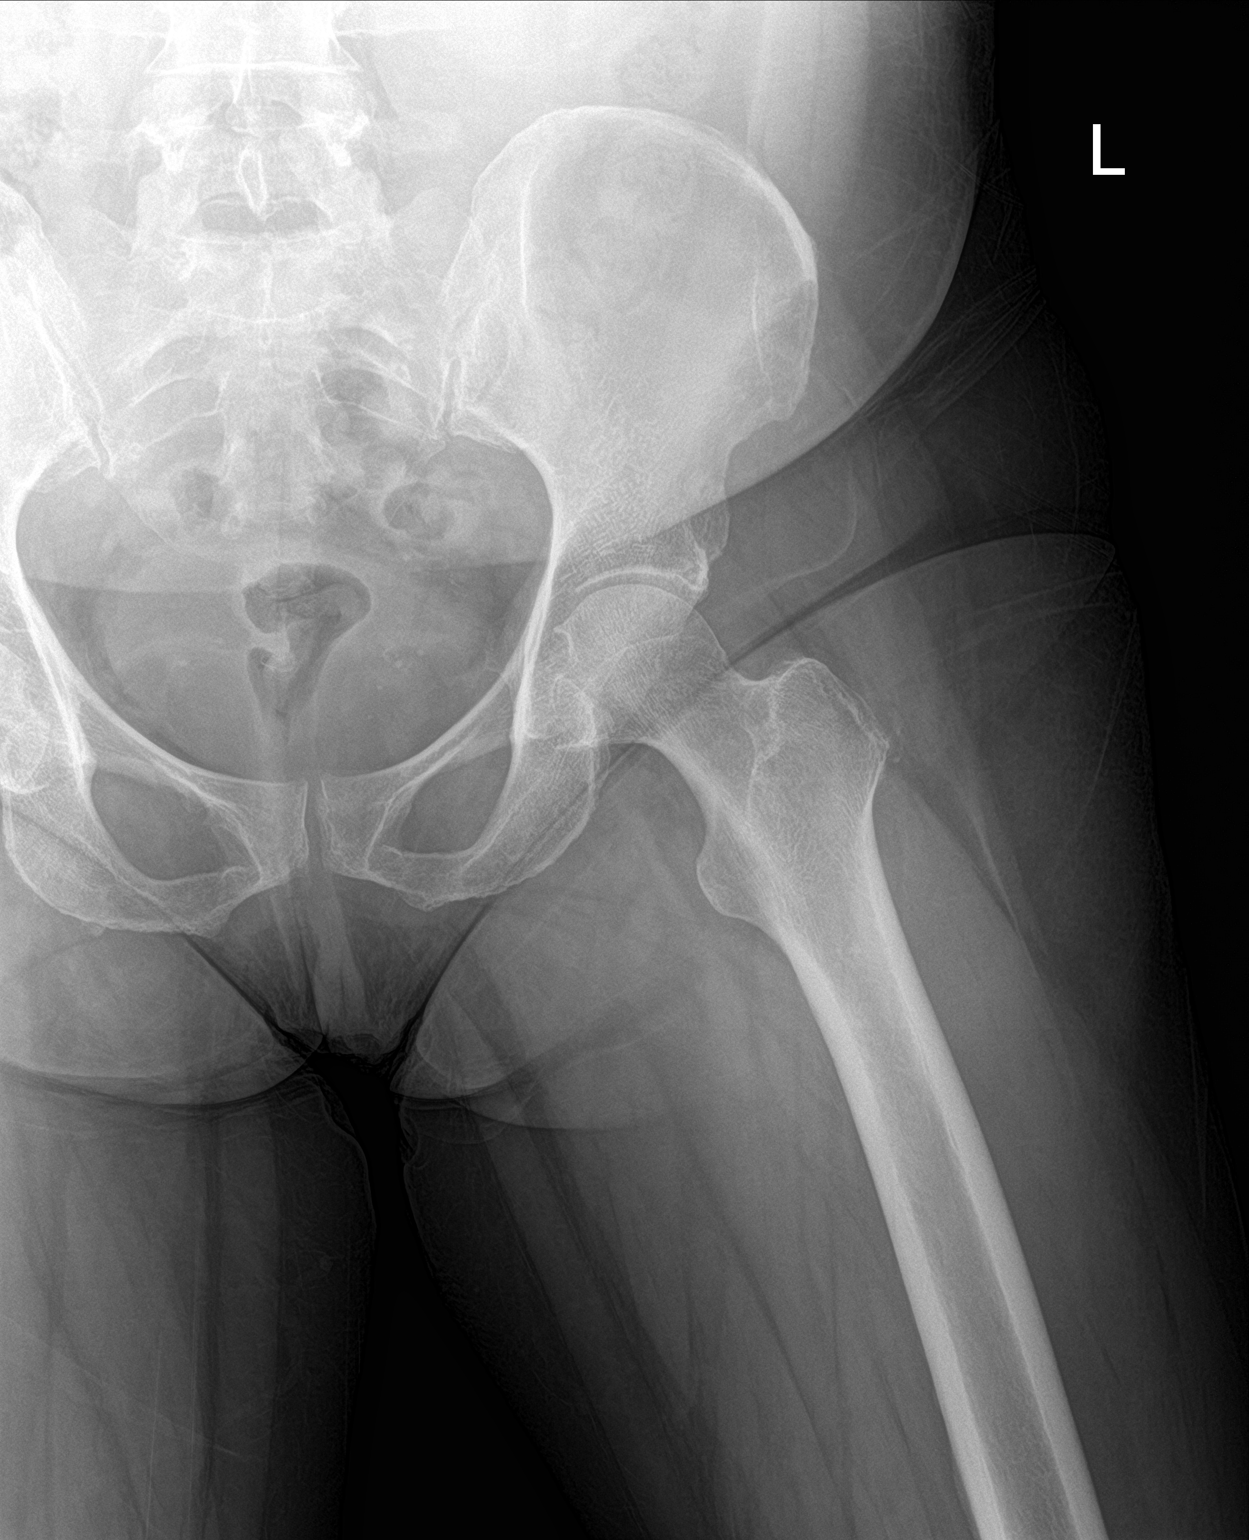

[3 of 3 positions shown; findings below may reference images not displayed]

FINDINGS: There is no acute fracture or dislocation. The bones are mildly
osteopenic. No significant arthritic changes. The soft tissues are
unremarkable.
IMPRESSION: Negative.

## 2020-11-24 IMAGING — CR DG TIBIA/FIBULA 2V*L*
4 series · 4 of 4 positions shown · non-contrast
Comparison: None.

CLINICAL DATA: 59-year-old female with left lower extremity pain.

EXAM:
LEFT TIBIA AND FIBULA - 2 VIEW; DG HIP (WITH OR WITHOUT PELVIS) 2-3V
LEFT

[tibia ap (1 of 2)]
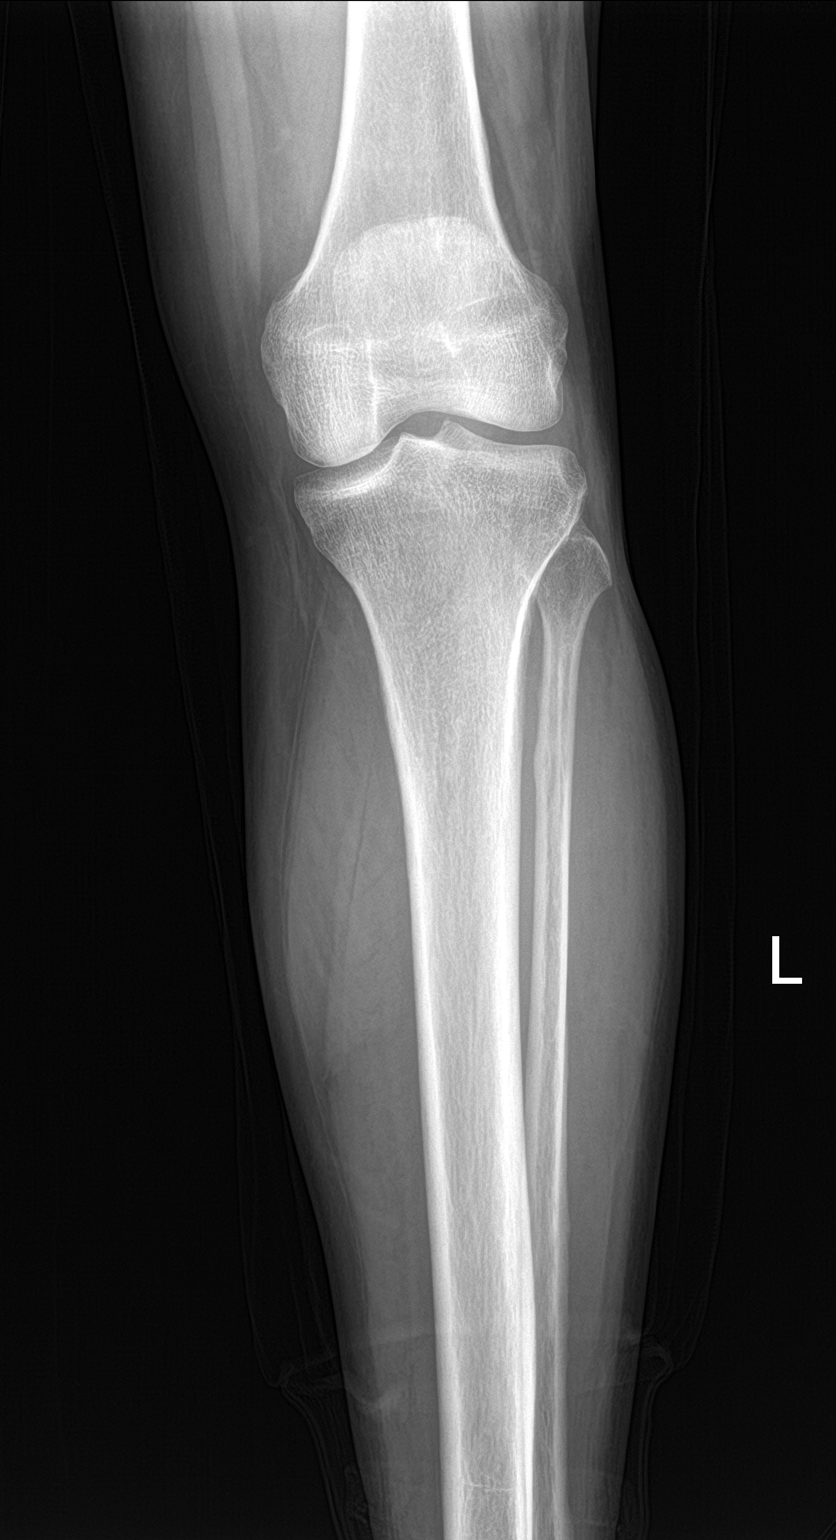

[tibia ap (2 of 2)]
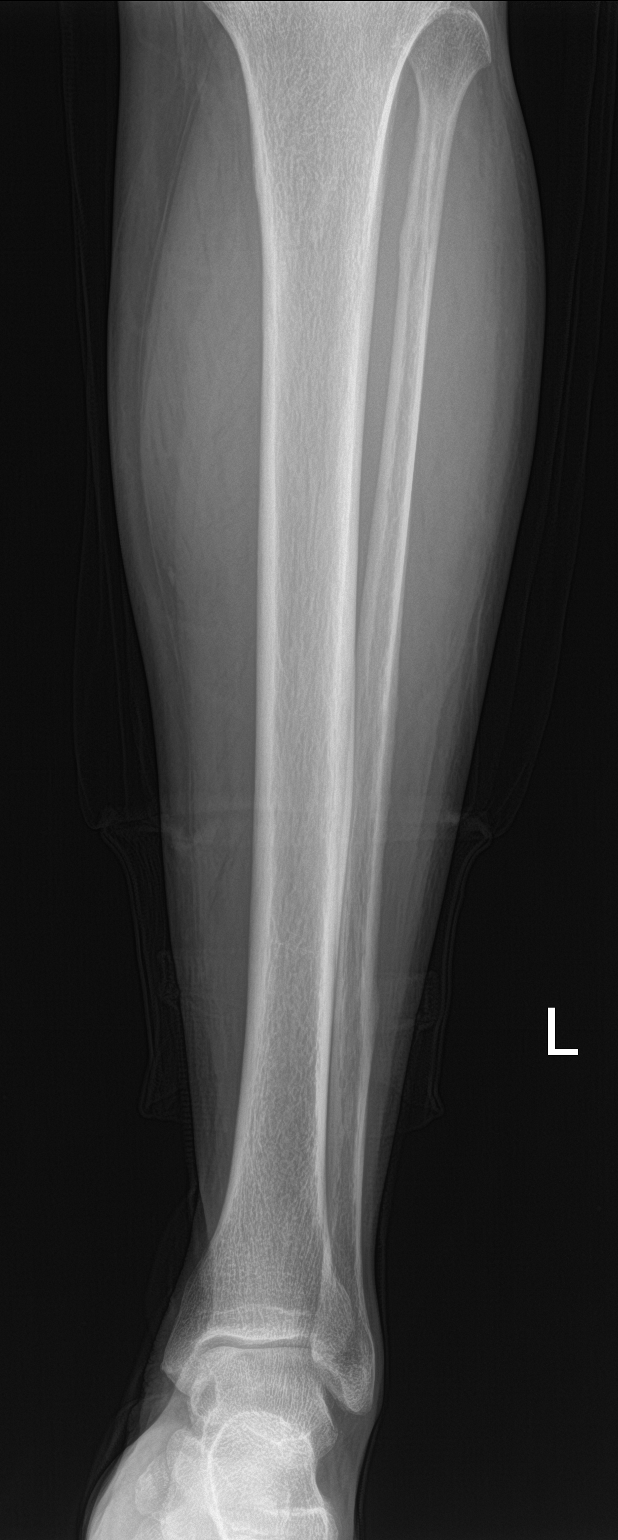

[tibia lat (1 of 2)]
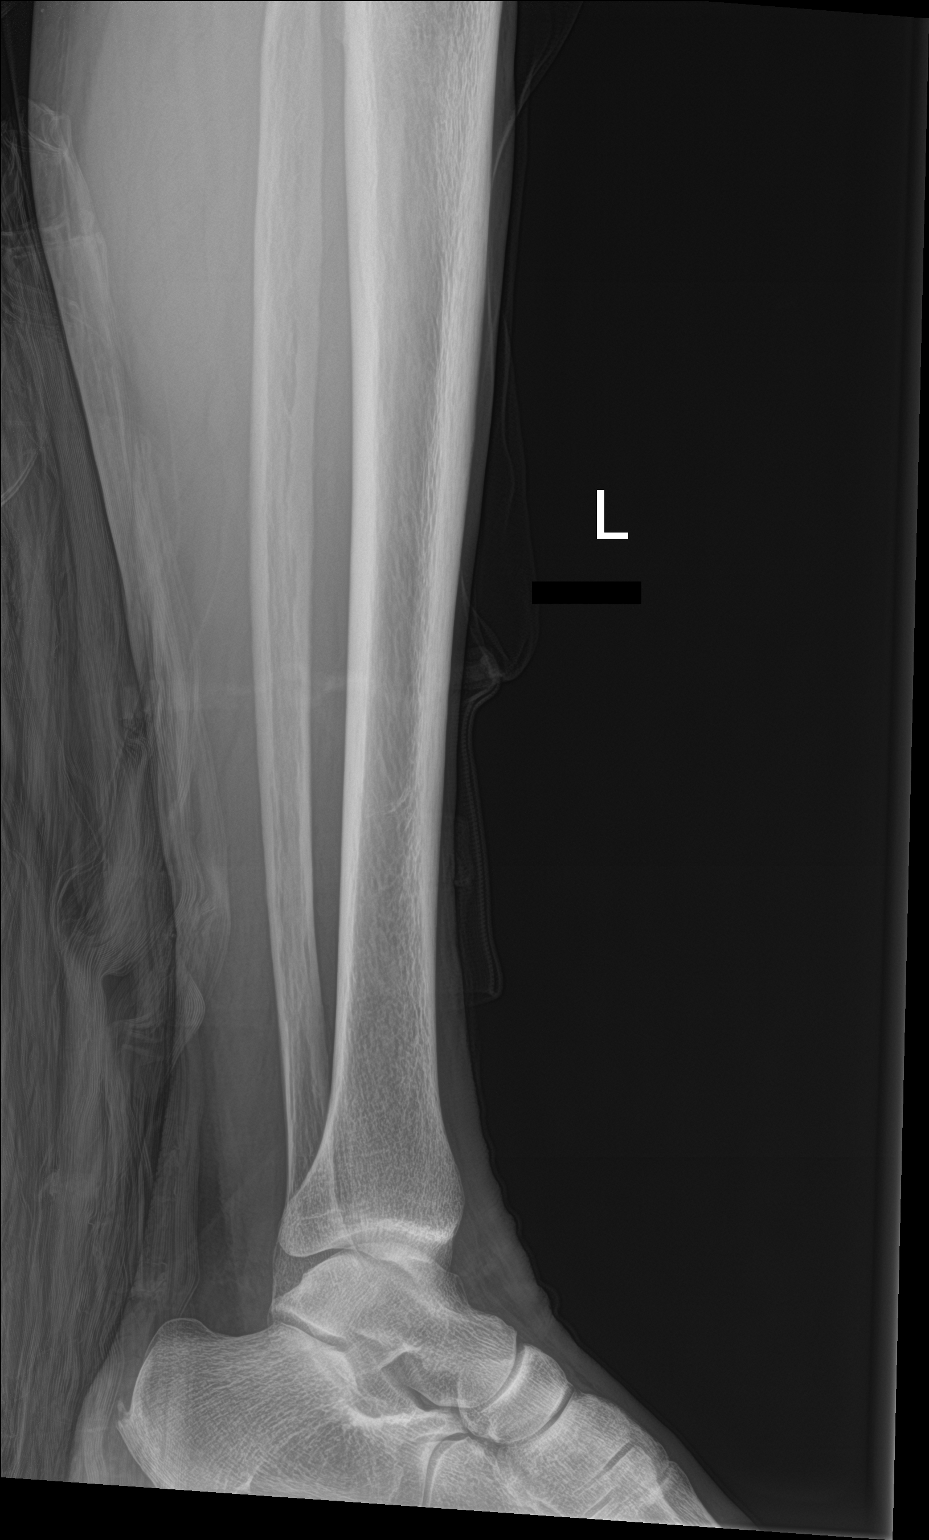

[tibia lat (2 of 2)]
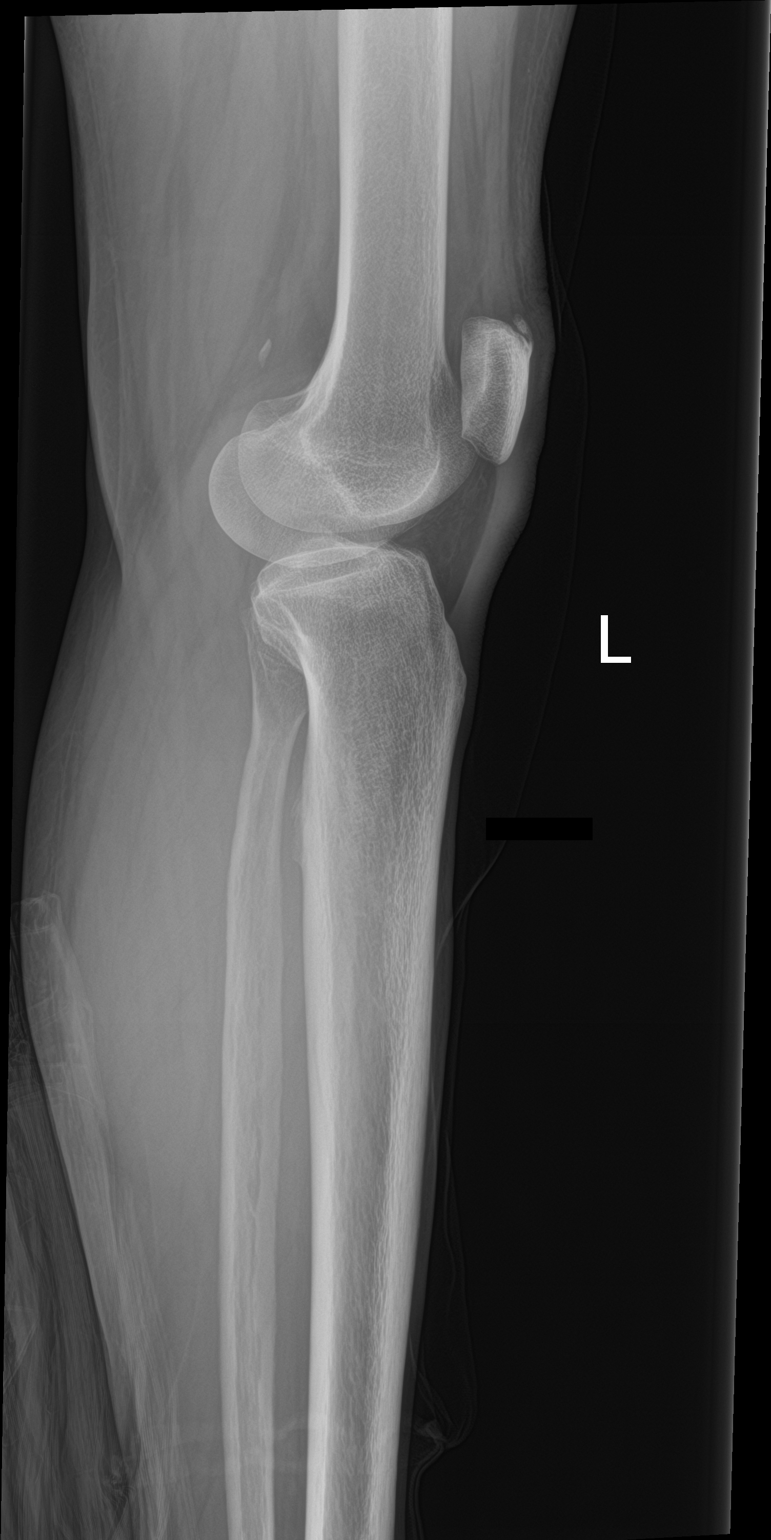

[4 of 4 positions shown; findings below may reference images not displayed]

FINDINGS: There is no acute fracture or dislocation. The bones are mildly
osteopenic. No significant arthritic changes. The soft tissues are
unremarkable.
IMPRESSION: Negative.

## 2020-11-24 NOTE — ED Triage Notes (Signed)
Patient with L hip pain over the last few days, now only able to partially bear weight

## 2020-11-25 DIAGNOSIS — M25552 Pain in left hip: Secondary | ICD-10-CM | POA: Diagnosis not present

## 2020-11-25 MED ORDER — OXYCODONE-ACETAMINOPHEN 5-325 MG PO TABS
1.0000 | ORAL_TABLET | Freq: Four times a day (QID) | ORAL | 0 refills | Status: DC | PRN
Start: 2020-11-25 — End: 2021-02-11

## 2020-11-25 MED ORDER — OXYCODONE-ACETAMINOPHEN 5-325 MG PO TABS
1.0000 | ORAL_TABLET | Freq: Once | ORAL | Status: AC
Start: 2020-11-25 — End: 2020-11-25
  Administered 2020-11-25: 1 via ORAL
  Filled 2020-11-25: qty 1

## 2020-11-25 MED ORDER — KETOROLAC TROMETHAMINE 60 MG/2ML IM SOLN
30.0000 mg | Freq: Once | INTRAMUSCULAR | Status: AC
  Administered 2020-11-25: 30 mg via INTRAMUSCULAR
  Filled 2020-11-25: qty 2

## 2020-11-25 NOTE — ED Notes (Signed)
Notified Dr. Dayna Barker of pt allergy to codeine and pt refused oxycodone. MD advised that he spoke with pt and she is ok with taking medication.

## 2020-11-25 NOTE — ED Notes (Signed)
Pt denies itching at this time.

## 2020-11-25 NOTE — ED Provider Notes (Signed)
[facility] EMERGENCY DEPARTMENT Provider Note   CSN: [id number] Arrival date & time: 11/24/20  2120     History Chief Complaint  Patient presents with  . Hip Pain    Beverly Ramirez is a 60 y.o. female.  3 to 4 days progressively worsening left lateral hip pain.  Patient states her so bad she has some difficulty walking.  Patient states that she did anything hurt it.  It started when she woke up 1 day.  She states that it mostly hurts in that area but it seems to radiate sometimes lower and higher.  The history is provided by the patient.  Hip Pain This is a new problem. The current episode started 1 to 2 hours ago. The problem occurs constantly. The problem has not changed since onset.Pertinent negatives include no chest pain.       Past Medical History:  Diagnosis Date  . Anemia   . Carpal tunnel syndrome    bilateral  . Chronic heel pain   . Diabetes mellitus without complication ([place])   . DJD (degenerative joint disease) of cervical spine    MRI 2017  . Hyperlipidemia   . Plantar fasciitis    bilateral  . Sickle cell anemia (HCC)    sickle cell trait  . Sleep apnea   . Tendonitis    left hand/wrist  . UC (ulcerative colitis) [facility])     Patient Active Problem List   Diagnosis Date Noted  . Type 2 diabetes mellitus with hyperglycemia, with long-term current use of insulin ([place]) 05/31/2020  . Fibromyalgia 05/31/2020  . Mouth ulcers 05/25/2020  . Elevated glucose 12/23/2019  . Diabetic ketoacidosis without coma associated with type 2 diabetes mellitus ([place]) 12/23/2019  . De Quervain's tenosynovitis, left 11/16/2018  . Bilateral carpal tunnel syndrome 04/13/2018  . Leukocytosis 10/04/2017  . Hyperlipidemia 08/04/2017  . Ulcerative colitis with complication ([place]) 60/63/0160  . Plantar fasciitis 05/28/2017    Past Surgical History:  Procedure Laterality Date  . ABDOMINAL HYSTERECTOMY    . APPENDECTOMY    . CESAREAN SECTION     x4  .  COLONOSCOPY     Multiple in [place]  . ESOPHAGOGASTRODUODENOSCOPY       OB History    Gravida  4   Para  4   Term      Preterm      AB      Living  4     SAB      IAB      Ectopic      Multiple      Live Births              Family History  Problem Relation Age of Onset  . Stroke Mother   . Diabetes Mother   . Hypertension Mother   . Lung cancer Mother   . Sickle cell trait Mother   . Diabetes Maternal Aunt   . Lung cancer Maternal Aunt   . Lung cancer Maternal Aunt   . Lung cancer Maternal Aunt   . Colon cancer Cousin 32  . Sickle cell trait Father   . Sickle cell anemia Sister 59  . Lung cancer Maternal Grandmother   . Sickle cell anemia Sister   . Other Sister        accident and blood clot formed    Social History   Tobacco Use  . Smoking status: Former Smoker    Types: Cigarettes    Quit date:  10/13/2006    Years since quitting: 14.1  . Smokeless tobacco: Never Used  Vaping Use  . Vaping Use: Never used  Substance Use Topics  . Alcohol use: Yes    Comment: occasional  . Drug use: No    Home Medications Prior to Admission medications   Medication Sig Start Date End Date Taking? Authorizing Provider  oxyCODONE-acetaminophen (PERCOCET) 5-325 MG tablet Take 1 tablet by mouth every 6 (six) hours as needed for severe pain. 11/25/20  Yes Mesner, Corene Cornea, MD  albuterol (VENTOLIN HFA) 108 (90 Base) MCG/ACT inhaler Inhale 2 puffs into the lungs every 6 (six) hours as needed for wheezing or shortness of breath. 03/13/20   Jacelyn Pi, Lilia Argue, MD  amLODipine (NORVASC) 5 MG tablet TAKE 1 TABLET BY MOUTH EVERY DAY 06/08/20   Jacelyn Pi, Lilia Argue, MD  atorvastatin (LIPITOR) 40 MG tablet Take 1 tablet (40 mg total) by mouth daily. 02/10/20   Jacelyn Pi, Lilia Argue, MD  dapagliflozin propanediol (FARXIGA) 10 MG TABS tablet Take 1 tablet (10 mg total) by mouth daily. 10/26/20   Shamleffer, Melanie Crazier, MD  dicyclomine (BENTYL) 20 MG tablet Take 1 tablet  (20 mg total) by mouth 3 (three) times daily as needed for spasms (abdominal cramping). 10/18/20   Wendie Agreste, MD  diphenoxylate-atropine (LOMOTIL) 2.5-0.025 MG tablet Take 1 tablet by mouth 4 (four) times daily as needed for diarrhea or loose stools. 04/27/20   Gatha Mayer, MD  DULoxetine (CYMBALTA) 60 MG capsule TAKE 1 CAPSULE BY MOUTH EVERY DAY 03/20/20   Jacelyn Pi, Lilia Argue, MD  esomeprazole (NEXIUM) 40 MG capsule TAKE 1 CAPSULE BY MOUTH EVERY DAY BEFORE BREAKFAST 07/20/20   Gatha Mayer, MD  fluticasone [facility]) 50 MCG/ACT nasal spray SPRAY 2 SPRAYS INTO EACH NOSTRIL EVERY DAY Patient taking differently: Patient takes as needed 03/08/20   Jacelyn Pi, Lilia Argue, MD  glucose blood (ONETOUCH VERIO) test strip Check blood glucose 3-4 x day. Dx E11.9, E11.649, z79.4, 11/06/20   Maximiano Coss, NP  insulin aspart (NOVOLOG FLEXPEN) 100 UNIT/ML FlexPen Inject 22 Units into the skin 3 (three) times daily with meals. 07/23/20   Shamleffer, Melanie Crazier, MD  Insulin Glargine (BASAGLAR KWIKPEN) 100 UNIT/ML Inject 32 Units into the skin at bedtime. 07/23/20   Shamleffer, Melanie Crazier, MD  Insulin Pen Needle 32G X 4 MM MISC 1 Device by Does not apply route in the morning, at noon, in the evening, and at bedtime. 07/23/20   Shamleffer, Melanie Crazier, MD  Lancets [facility] DELICA PLUS PPIRJJ88C) MISC 1 each by Does not apply route 3 (three) times daily. Dx E11.65, z79.4 01/24/19   Jacelyn Pi, Lilia Argue, MD  lidocaine (XYLOCAINE) 2 % solution Use as directed 15 mLs in the mouth or throat as needed for mouth pain. 03/09/20   Gatha Mayer, MD  mesalamine (LIALDA) 1.2 g EC tablet Take 2 tablets (2.4 g total) by mouth 2 (two) times daily. 04/28/20 04/23/21  Gatha Mayer, MD  methocarbamol (ROBAXIN) 500 MG tablet Take 1 tablet (500 mg total) by mouth every 8 (eight) hours as needed for muscle spasms. 05/31/20   Jacelyn Pi, Lilia Argue, MD  montelukast (SINGULAIR) 10 MG tablet TAKE 1 TABLET BY MOUTH  EVERYDAY AT BEDTIME 05/27/20   Maximiano Coss, NP  Multiple Vitamins-Minerals (WOMENS 50+ [place] VITAMIN/MIN PO) Take by mouth.    [provider]  NONFORMULARY OR COMPOUNDED ITEM Swish and spit 5 mLs as needed (ulcer flare up in mouth).  Magic mouthwash with lidocaine disp#150 ml 05/04/20   Gatha Mayer, MD  ondansetron (ZOFRAN ODT) 4 MG disintegrating tablet Take 1 tablet (4 mg total) by mouth every 8 (eight) hours as needed for nausea or vomiting. 05/08/20   Long, Wonda Olds, MD  triamcinolone cream (KENALOG) 0.1 % Apply 1 application topically 2 (two) times daily. 04/19/20   Daleen Squibb, MD    Allergies    Sulfa antibiotics, Sulfamethoxazole-trimethoprim, Codeine, and Latex  Review of Systems   Review of Systems  Cardiovascular: Negative for chest pain.  All other systems reviewed and are negative.   Physical Exam Updated Vital Signs BP (!) 139/92 (BP Location: Left Arm)   Pulse 69   Temp 97.8 F (36.6 C) (Oral)   Resp 18   Ht 5' 2"  (1.575 m)   Wt 72.6 kg   SpO2 99%   BMI 29.26 kg/m   Physical Exam Vitals and nursing note reviewed.  Constitutional:      Appearance: She is well-developed.  HENT:     Head: Normocephalic and atraumatic.     Nose: Nose normal. No congestion or rhinorrhea.     Mouth/Throat:     Mouth: Mucous membranes are moist.     Pharynx: Oropharynx is clear.  Eyes:     Pupils: Pupils are equal, round, and reactive to light.  Cardiovascular:     Rate and Rhythm: Normal rate and regular rhythm.  Pulmonary:     Effort: No respiratory distress.     Breath sounds: No stridor.  Abdominal:     General: Abdomen is flat. There is no distension.  Musculoskeletal:        General: Tenderness (left lateral hip) present.     Cervical back: Normal range of motion.  Skin:    General: Skin is warm and dry.  Neurological:     General: No focal deficit present.     Mental Status: She is alert.     ED Results / Procedures / Treatments    Labs (all labs ordered are listed, but only abnormal results are displayed) Labs Reviewed - No data to display  EKG None  Radiology DG Tibia/Fibula Left  Result Date: 11/24/2020 CLINICAL DATA:  60 year old female with left lower extremity pain. EXAM: LEFT TIBIA AND FIBULA - 2 VIEW; DG HIP (WITH OR WITHOUT PELVIS) 2-3V LEFT COMPARISON:  None. FINDINGS: There is no acute fracture or dislocation. The bones are mildly osteopenic. No significant arthritic changes. The soft tissues are unremarkable. IMPRESSION: Negative. Electronically Signed   By: Anner Crete M.D.   On: 11/24/2020 22:58   DG HIP UNILAT WITH PELVIS 2-3 VIEWS LEFT  Result Date: 11/24/2020 CLINICAL DATA:  60 year old female with left lower extremity pain. EXAM: LEFT TIBIA AND FIBULA - 2 VIEW; DG HIP (WITH OR WITHOUT PELVIS) 2-3V LEFT COMPARISON:  None. FINDINGS: There is no acute fracture or dislocation. The bones are mildly osteopenic. No significant arthritic changes. The soft tissues are unremarkable. IMPRESSION: Negative. Electronically Signed   By: Anner Crete M.D.   On: 11/24/2020 22:58    Procedures Procedures   Medications Ordered in ED Medications  oxyCODONE-acetaminophen (PERCOCET/ROXICET) 5-325 MG per tablet 1 tablet (1 tablet Oral Given 11/25/20 0358)  ketorolac (TORADOL) injection 30 mg (30 mg Intramuscular Given 11/25/20 0306)    ED Course  I have reviewed the triage vital signs and the nursing notes.  Pertinent labs & imaging results that were available during my care of the patient were reviewed  by me and considered in my medical decision making (see chart for details).    MDM Rules/Calculators/A&P                          Patient with likely bursitis.  No fracture or obvious neoplasm or other abnormalities on x-ray. knee patient's pain improved significantly with medications here.  Will discharge on same.  PCP and orthopedics follow-up as needed.  Final Clinical Impression(s) / ED  Diagnoses Final diagnoses:  Left hip pain    Rx / DC Orders ED Discharge Orders         Ordered    oxyCODONE-acetaminophen (PERCOCET) 5-325 MG tablet  Every 6 hours PRN        11/25/20 0438           Mesner, Corene Cornea, MD 11/25/20 [phone]

## 2020-11-26 ENCOUNTER — Ambulatory Visit: Payer: Medicare Other | Admitting: Physical Therapy

## 2020-11-26 ENCOUNTER — Other Ambulatory Visit: Payer: Self-pay

## 2020-11-26 ENCOUNTER — Encounter: Payer: Self-pay | Admitting: Physical Therapy

## 2020-11-26 DIAGNOSIS — R2681 Unsteadiness on feet: Secondary | ICD-10-CM

## 2020-11-26 DIAGNOSIS — R262 Difficulty in walking, not elsewhere classified: Secondary | ICD-10-CM

## 2020-11-26 DIAGNOSIS — R42 Dizziness and giddiness: Secondary | ICD-10-CM | POA: Diagnosis not present

## 2020-11-26 NOTE — Therapy (Signed)
[facility] [address] [place], [place], [address] Phone: [phone]   Fax:  [phone]  Physical Therapy Treatment  Patient Details  Name: Beverly Ramirez MRN: [id number] Date of Birth: 1961-05-13 Referring Provider (PT): Leta Baptist, MD   Encounter Date: 11/26/2020   PT End of Session - 11/26/20 1631    Visit Number 6    Number of Visits 7    Date for PT Re-Evaluation 12/10/20    Authorization Type Medicare; 10th visit PN    Progress Note Due on Visit 10    PT Start Time 1445    PT Stop Time 1530    PT Time Calculation (min) 45 min    Activity Tolerance Patient limited by pain    Behavior During Therapy [facility] for tasks assessed/performed           Past Medical History:  Diagnosis Date  . Anemia   . Carpal tunnel syndrome    bilateral  . Chronic heel pain   . Diabetes mellitus without complication ([place])   . DJD (degenerative joint disease) of cervical spine    MRI 2017  . Hyperlipidemia   . Plantar fasciitis    bilateral  . Sickle cell anemia (HCC)    sickle cell trait  . Sleep apnea   . Tendonitis    left hand/wrist  . UC (ulcerative colitis) ([place])     Past Surgical History:  Procedure Laterality Date  . ABDOMINAL HYSTERECTOMY    . APPENDECTOMY    . CESAREAN SECTION     x4  . COLONOSCOPY     Multiple in [place]  . ESOPHAGOGASTRODUODENOSCOPY      There were no vitals filed for this visit.   Subjective Assessment - 11/26/20 1458    Subjective Was lightheaded on Friday and Saturday but pt also pt had to go ED for L hip pain; woke up and wasn't able to put any weight on LLE.  Had xray at ED, no fracture or acute abnormality.  Pain and dizziness settled yesterday.  6/10 pain in the hip today, but no dizziness today.    Pertinent History ulcerative colitis, HLD, HTN, DM, De Quervain's tenosynovitis, bilat carpal tunnel, plantar fasciitis, fibromyalgia, sickle cell anemia, OSA     Limitations Standing;Walking;House hold activities    How long can you sit comfortably? "A couple minutes"    How long can you stand comfortably? 30 minutes    How long can you walk comfortably? 15 minutes    Patient Stated Goals To just stop spinning.    Currently in Pain? Yes              [facility] PT Assessment - 11/26/20 1505      Assessment   Medical Diagnosis Acoustic Neuroma    Referring Provider (PT) Leta Baptist, MD    Onset Date/Surgical Date 10/03/20    Hand Dominance Right    Next MD Visit Not scheduled    Prior Therapy yes for neck and back pain      Precautions   Precautions Other (comment)    Precaution Comments ulcerative colitis, HLD, HTN, DM, De Quervain's tenosynovitis, bilat carpal tunnel, plantar fasciitis, fibromyalgia, sickle cell anemia, OSA      Prior Function   Level of Independence Independent      Observation/Other Assessments   Focus on Therapeutic Outcomes (FOTO)  Dizziness Functional Status: 58 > 65%; Dizziness Positional Status: 55.3 > 65.5%      ROM / Strength  AROM / PROM / Strength AROM      AROM   Overall AROM  Within functional limits for tasks performed    Overall AROM Comments no pain    AROM Assessment Site Cervical    Cervical Flexion 60    Cervical Extension 35    Cervical - Right Side Bend 40    Cervical - Left Side Bend 40    Cervical - Right Rotation 50    Cervical - Left Rotation 50      Functional Gait  Assessment   Gait assessed  No    FGA comment: not tested today due to L hip pain               Vestibular Assessment - 11/26/20 1509      Positional Sensitivities   Sit to Supine No dizziness    Supine to Left Side No dizziness    Supine to Right Side No dizziness    Supine to Sitting No dizziness    Nose to Right Knee No dizziness    Right Knee to Sitting No dizziness    Nose to Left Knee No dizziness    Left Knee to Sitting No dizziness    Head Turning x 5 No dizziness    Head Nodding x 5 No dizziness     Pivot Right in Standing No dizziness    Pivot Left in Standing No dizziness    Rolling Right No dizziness    Rolling Left No dizziness                            PT Education - 11/26/20 1630    Education Details progress toward goals, plan for final visit next week; ice for L hip    Person(s) Educated Patient    Methods Explanation    Comprehension Verbalized understanding            PT Short Term Goals - 11/26/20 1634      PT SHORT TERM GOAL #1   Title = LTG             PT Long Term Goals - 11/26/20 1504      PT LONG TERM GOAL #1   Title Pt will report improvement on DFS to 59% and 5 point increase in DPS    Baseline Dizziness Functional Status: 58 > 65%; Dizziness Positional Status: 55.3 > 65.5%    Time 6    Period Weeks    Status Achieved      PT LONG TERM GOAL #2   Title Pt will demonstrate independence with final vestibular, balance and neck HEP    Time 6    Period Weeks    Status On-going      PT LONG TERM GOAL #3   Title Pt will demonstrate 20 deg increase in neck flexion/extension and 10 deg increase in lateral flexion and rotation bilaterally and will report 50% reduction in HA    Time 6    Period Weeks    Status Achieved      PT LONG TERM GOAL #4   Title Pt will report 75% reduction in dizziness with supine <> sit, quick head/body turns, bending forwards and when driving    Time 6    Period Weeks    Status Achieved      PT LONG TERM GOAL #5   Title Pt will demonstrate 4 point improvement in FGA to indicate decreased falls  risk    Baseline 24/30    Time 6    Period Weeks    Status Unable to assess           New goals for recertification:     PT Long Term Goals - 11/26/20 1636      PT LONG TERM GOAL #2   Title Pt will demonstrate independence with final vestibular, balance and neck HEP    Time 2    Period Weeks    Status Revised    Target Date 12/10/20      PT LONG TERM GOAL #5   Title Pt will demonstrate 4 point  improvement in FGA to indicate decreased falls risk    Baseline 24/30    Time 2    Period Weeks    Status Revised    Target Date 12/10/20               Plan - 11/26/20 1631    Clinical Impression Statement Initiated assessment of progress towards LTG.  Pt has made excellent progress and has met FOTO goal with pt exceeding functional and positional status outcomes, reports no dizziness or motion sensitivity on MSQ and demonstrates >15-20 degree improvement in cervical flexion, extension and lateral flexion ROM.  Unable to assess FGA today due to ongoing L hip pain.  Plan to recertify for one more visit to assess FGA and finalize HEP for D/C.    Personal Factors and Comorbidities Comorbidity 3+;Past/Current Experience    Comorbidities ulcerative colitis, HLD, HTN, DM, De Quervain's tenosynovitis, bilat carpal tunnel, plantar fasciitis, fibromyalgia, sickle cell anemia, OSA    Examination-Activity Limitations Bed Mobility;Bend;Locomotion Level;Reach Overhead    Examination-Participation Restrictions Community Activity;Driving    PT Frequency 1x / week    PT Duration 2 weeks    PT Treatment/Interventions ADLs/Self Care Home Management;Canalith Repostioning;Cryotherapy;Moist Heat;Gait training;Stair training;Functional mobility training;Therapeutic activities;Therapeutic exercise;Balance training;Neuromuscular re-education;Patient/family education;Manual techniques;Passive range of motion;Dry needling;Vestibular;Taping    PT Next Visit Plan How is L hip?  check FGA, finalize HEP, D/C    Consulted and Agree with Plan of Care Patient           Patient will benefit from skilled therapeutic intervention in order to improve the following deficits and impairments:  Decreased balance,Decreased range of motion,Difficulty walking,Dizziness,Pain  Visit Diagnosis: Dizziness and giddiness  Unsteadiness on feet  Difficulty in walking, not elsewhere classified     Problem List Patient Active  Problem List   Diagnosis Date Noted  . Type 2 diabetes mellitus with hyperglycemia, with long-term current use of insulin ([place]) 05/31/2020  . Fibromyalgia 05/31/2020  . Mouth ulcers 05/25/2020  . Elevated glucose 12/23/2019  . Diabetic ketoacidosis without coma associated with type 2 diabetes mellitus ([place]) 12/23/2019  . De Quervain's tenosynovitis, left 11/16/2018  . Bilateral carpal tunnel syndrome 04/13/2018  . Leukocytosis 10/04/2017  . Hyperlipidemia 08/04/2017  . Ulcerative colitis with complication ([place]) 15/94/5859  . Plantar fasciitis 05/28/2017   Rico Junker, PT, DPT 11/26/20    4:35 PM    [facility] [address] [place], [place], [address] Phone: [phone]   Fax:  [phone]  Name: Beverly Ramirez MRN: [id number] Date of Birth: 16-May-1961

## 2020-11-27 ENCOUNTER — Other Ambulatory Visit: Payer: Self-pay | Admitting: Registered Nurse

## 2020-11-27 DIAGNOSIS — R059 Cough, unspecified: Secondary | ICD-10-CM

## 2020-11-27 NOTE — Telephone Encounter (Signed)
Requested medications are due for refill today yes  Requested medications are on the active medication list yes  Last refill 12/16  Last visit 01/2020  Future visit scheduled no  Notes to clinic No PCP, is this patient still associated with your practice, no PCP listed, however, Maximiano Coss, NP wrote this last rx.

## 2020-12-03 ENCOUNTER — Ambulatory Visit: Payer: Medicare Other | Admitting: Physical Therapy

## 2020-12-03 ENCOUNTER — Other Ambulatory Visit: Payer: Self-pay

## 2020-12-03 ENCOUNTER — Encounter: Payer: Self-pay | Admitting: Physical Therapy

## 2020-12-03 DIAGNOSIS — R42 Dizziness and giddiness: Secondary | ICD-10-CM

## 2020-12-03 DIAGNOSIS — R2681 Unsteadiness on feet: Secondary | ICD-10-CM | POA: Diagnosis not present

## 2020-12-03 DIAGNOSIS — R262 Difficulty in walking, not elsewhere classified: Secondary | ICD-10-CM | POA: Diagnosis not present

## 2020-12-03 DIAGNOSIS — M25552 Pain in left hip: Secondary | ICD-10-CM | POA: Diagnosis not present

## 2020-12-03 NOTE — Patient Instructions (Addendum)
Gaze Stabilization: Standing Feet Together (Compliant Surface)    Feet together on pillow, keeping eyes on target on wall __3_ feet away, tilt head down 15-30 and move head side to side (Saying NO) for __60__ seconds, slightly faster speed.  Repeat while moving head up and down (saying YES) at slightly faster speed for __60__ seconds. Do __2__ sessions per day.    Feet Together (Compliant Surface) Head Motion - Eyes Closed    Stand on compliant surface: Pillow with feet close together.  Have a counter top/wall/couch behind you and a chair in front for support. Close eyes and move head slowly, up and down 10 times.  Regain your balance and then perform head left and right, slowly 10 times. Repeat 2 times per session. Do 2 sessions per day.   Feet Heel-Toe "Tandem", Head Motion - Eyes Closed    Place right foot slightly ahead of the left.  Close your eyes and maintain your balance for 20 seconds. Open eyes, switch feet, close eyes and hold your balance for 20 seconds Repeat _2___ times per session. Do _2___ sessions per day.    Walking with Eyes Closed and hand touching wall    Walk forwards in your hallway with one hand lightly touching your wall.  Close your eyes and walk forwards until you reach the end of the hall.  Turn around, close your eyes and repeat.  Try to keep a straight path. Repeat _4__ times. Do _2__ times a day.

## 2020-12-03 NOTE — Therapy (Signed)
[facility] [address] [place], [place], [address] Phone: [phone]   Fax:  [phone]  Physical Therapy Treatment and Discharge  Patient Details  Name: Beverly Ramirez MRN: [id number] Date of Birth: 09-13-1960 Referring Provider (PT): Leta Baptist, MD   Encounter Date: 12/03/2020   PT End of Session - 12/03/20 1459    Visit Number 7    Number of Visits 7    Date for PT Re-Evaluation 12/10/20    Authorization Type Medicare; 10th visit PN    Progress Note Due on Visit 10    PT Start Time 7989    PT Stop Time 1452    PT Time Calculation (min) 41 min    Activity Tolerance Patient limited by pain    Behavior During Therapy [facility] for tasks assessed/performed           Past Medical History:  Diagnosis Date  . Anemia   . Carpal tunnel syndrome    bilateral  . Chronic heel pain   . Diabetes mellitus without complication ([place])   . DJD (degenerative joint disease) of cervical spine    MRI 2017  . Hyperlipidemia   . Plantar fasciitis    bilateral  . Sickle cell anemia (HCC)    sickle cell trait  . Sleep apnea   . Tendonitis    left hand/wrist  . UC (ulcerative colitis) ([place])     Past Surgical History:  Procedure Laterality Date  . ABDOMINAL HYSTERECTOMY    . APPENDECTOMY    . CESAREAN SECTION     x4  . COLONOSCOPY     Multiple in [place]  . ESOPHAGOGASTRODUODENOSCOPY      There were no vitals filed for this visit.   Subjective Assessment - 12/03/20 1412    Subjective Pt reports x-rays were negative for her L hip.  She went to the orthopedic doctor and has an MRI scheduled.    Pertinent History ulcerative colitis, HLD, HTN, DM, De Quervain's tenosynovitis, bilat carpal tunnel, plantar fasciitis, fibromyalgia, sickle cell anemia, OSA    Limitations Standing;Walking;House hold activities    How long can you sit comfortably? "A couple minutes"    How long can you stand comfortably? 30 minutes     How long can you walk comfortably? 15 minutes    Patient Stated Goals To just stop spinning.    Currently in Pain? Yes    Pain Score 4     Pain Location Hip    Pain Orientation Left              [facility] PT Assessment - 12/03/20 1415      Functional Gait  Assessment   Gait Level Surface Walks 20 ft in less than 5.5 sec, no assistive devices, good speed, no evidence for imbalance, normal gait pattern, deviates no more than 6 in outside of the 12 in walkway width.    Change in Gait Speed Able to smoothly change walking speed without loss of balance or gait deviation. Deviate no more than 6 in outside of the 12 in walkway width.    Gait with Horizontal Head Turns Performs head turns smoothly with no change in gait. Deviates no more than 6 in outside 12 in walkway width    Gait with Vertical Head Turns Performs head turns with no change in gait. Deviates no more than 6 in outside 12 in walkway width.    Gait and Pivot Turn Pivot turns safely within 3 sec and  stops quickly with no loss of balance.    Step Over Obstacle Is able to step over 2 stacked shoe boxes taped together (9 in total height) without changing gait speed. No evidence of imbalance.    Gait with Narrow Base of Support Is able to ambulate for 10 steps heel to toe with no staggering.    Gait with Eyes Closed Walks 20 ft, uses assistive device, slower speed, mild gait deviations, deviates 6-10 in outside 12 in walkway width. Ambulates 20 ft in less than 9 sec but greater than 7 sec.    Ambulating Backwards Walks 20 ft, uses assistive device, slower speed, mild gait deviations, deviates 6-10 in outside 12 in walkway width.    Steps Alternating feet, no rail.    Total Score 28    FGA comment: 28/30                          Vestibular Treatment/Exercise - 12/03/20 1454      X1 Viewing Horizontal   Foot Position feet together on pillow    Reps 1    Comments x30 sec, x60 sec; slightly faster head turns; slight  instability noted afterwards      X1 Viewing Vertical   Foot Position feet together on pillow    Reps 1    Comments x30, x60 sec; slightly faster head turns; slight instability noted afterwards              Balance Exercises - 12/03/20 1450      Balance Exercises: Standing   Standing Eyes Closed Narrow base of support (BOS);Foam/compliant surface;Limitations   x10 head turns up/down and left/right with eyes closed   Tandem Stance Eyes closed;20 secs;Limitations    Tandem Stance Time standing in corner with chair in front for support just in case    Other Standing Exercises x4 walking down hall with eyes closed and hand on wall for guidance             PT Education - 12/03/20 1458    Education Details Final HEP, if severe return of symptoms can return to therapy    Person(s) Educated Patient    Methods Explanation;Handout    Comprehension Verbalized understanding            PT Short Term Goals - 11/26/20 1634      PT SHORT TERM GOAL #1   Title = LTG             PT Long Term Goals - 12/03/20 1504      PT LONG TERM GOAL #2   Title Pt will demonstrate independence with final vestibular, balance and neck HEP    Time 2    Period Weeks    Status Achieved      PT LONG TERM GOAL #5   Title Pt will demonstrate 4 point improvement in FGA to indicate decreased falls risk    Baseline 24/30; 12/03/20 28/30    Time 2    Period Weeks    Status Achieved                 Plan - 12/03/20 1459    Clinical Impression Statement Pt made significant progress with therapy meeting all of her LTGs.  Unable to provoke dizziness symptoms with habituation or balance exericses.  FGA showed clinically significant improvements and pt currently is at a low risk for falls.  She still presents with mild instability with exercises that  are primarily targets the vestibular system.  Updates to her final HEP will address these limitations and maintain current gains.    Personal Factors  and Comorbidities Comorbidity 3+;Past/Current Experience    Comorbidities ulcerative colitis, HLD, HTN, DM, De Quervain's tenosynovitis, bilat carpal tunnel, plantar fasciitis, fibromyalgia, sickle cell anemia, OSA    Examination-Activity Limitations Bed Mobility;Bend;Locomotion Level;Reach Overhead    Examination-Participation Restrictions Community Activity;Driving    PT Frequency 1x / week    PT Duration 2 weeks    PT Treatment/Interventions ADLs/Self Care Home Management;Canalith Repostioning;Cryotherapy;Moist Heat;Gait training;Stair training;Functional mobility training;Therapeutic activities;Therapeutic exercise;Balance training;Neuromuscular re-education;Patient/family education;Manual techniques;Passive range of motion;Dry needling;Vestibular;Taping    PT Next Visit Plan --    Consulted and Agree with Plan of Care Patient           Patient will benefit from skilled therapeutic intervention in order to improve the following deficits and impairments:  Decreased balance,Decreased range of motion,Difficulty walking,Dizziness,Pain  Visit Diagnosis: Dizziness and giddiness  Unsteadiness on feet  Difficulty in walking, not elsewhere classified     Problem List Patient Active Problem List   Diagnosis Date Noted  . Type 2 diabetes mellitus with hyperglycemia, with long-term current use of insulin ([place]) 05/31/2020  . Fibromyalgia 05/31/2020  . Mouth ulcers 05/25/2020  . Elevated glucose 12/23/2019  . Diabetic ketoacidosis without coma associated with type 2 diabetes mellitus ([place]) 12/23/2019  . De Quervain's tenosynovitis, left 11/16/2018  . Bilateral carpal tunnel syndrome 04/13/2018  . Leukocytosis 10/04/2017  . Hyperlipidemia 08/04/2017  . Ulcerative colitis with complication ([place]) 32/44/0102  . Plantar fasciitis 05/28/2017    PHYSICAL THERAPY DISCHARGE SUMMARY  Visits from Start of Care: 7  Current functional level related to goals / functional outcomes: Low falls  risk, no provocation of dizziness symptoms with exercise   Remaining deficits: Mild instability with vestibular as primary balance system used, walking with eyes closed   Education / Equipment: Final HEP  Plan: Patient agrees to discharge.  Patient goals were met. Patient is being discharged due to meeting the stated rehab goals.  ?????      Yetta Numbers, SPT  12/03/2020, 3:06 PM  [facility] [address] [place], [place], [address] Phone: [phone]   Fax:  [phone]  Name: Beverly Ramirez MRN: [id number] Date of Birth: 1960-12-17

## 2020-12-25 DIAGNOSIS — M25552 Pain in left hip: Secondary | ICD-10-CM | POA: Diagnosis not present

## 2021-01-01 DIAGNOSIS — M1612 Unilateral primary osteoarthritis, left hip: Secondary | ICD-10-CM | POA: Diagnosis not present

## 2021-01-01 DIAGNOSIS — M7062 Trochanteric bursitis, left hip: Secondary | ICD-10-CM | POA: Diagnosis not present

## 2021-01-16 DIAGNOSIS — M25552 Pain in left hip: Secondary | ICD-10-CM | POA: Diagnosis not present

## 2021-02-11 ENCOUNTER — Emergency Department (HOSPITAL_COMMUNITY): Payer: Medicare Other

## 2021-02-11 ENCOUNTER — Emergency Department (HOSPITAL_COMMUNITY)
Admission: EM | Admit: 2021-02-11 | Discharge: 2021-02-11 | Disposition: A | Discharge status: Home / Self Care | Payer: Medicare Other | Attending: Emergency Medicine | Admitting: Emergency Medicine

## 2021-02-11 DIAGNOSIS — S3993XA Unspecified injury of pelvis, initial encounter: Secondary | ICD-10-CM | POA: Diagnosis not present

## 2021-02-11 DIAGNOSIS — K7689 Other specified diseases of liver: Secondary | ICD-10-CM | POA: Diagnosis not present

## 2021-02-11 DIAGNOSIS — M542 Cervicalgia: Secondary | ICD-10-CM | POA: Diagnosis present

## 2021-02-11 DIAGNOSIS — R1010 Upper abdominal pain, unspecified: Secondary | ICD-10-CM | POA: Insufficient documentation

## 2021-02-11 DIAGNOSIS — R519 Headache, unspecified: Secondary | ICD-10-CM | POA: Insufficient documentation

## 2021-02-11 DIAGNOSIS — E119 Type 2 diabetes mellitus without complications: Secondary | ICD-10-CM | POA: Insufficient documentation

## 2021-02-11 DIAGNOSIS — S39012A Strain of muscle, fascia and tendon of lower back, initial encounter: Secondary | ICD-10-CM

## 2021-02-11 DIAGNOSIS — R109 Unspecified abdominal pain: Secondary | ICD-10-CM | POA: Insufficient documentation

## 2021-02-11 DIAGNOSIS — R079 Chest pain, unspecified: Secondary | ICD-10-CM | POA: Diagnosis not present

## 2021-02-11 DIAGNOSIS — J9811 Atelectasis: Secondary | ICD-10-CM | POA: Diagnosis not present

## 2021-02-11 DIAGNOSIS — G4489 Other headache syndrome: Secondary | ICD-10-CM | POA: Diagnosis not present

## 2021-02-11 DIAGNOSIS — Z91040 Latex allergy status: Secondary | ICD-10-CM | POA: Diagnosis not present

## 2021-02-11 DIAGNOSIS — R0689 Other abnormalities of breathing: Secondary | ICD-10-CM | POA: Diagnosis not present

## 2021-02-11 DIAGNOSIS — J9859 Other diseases of mediastinum, not elsewhere classified: Secondary | ICD-10-CM | POA: Diagnosis not present

## 2021-02-11 DIAGNOSIS — I10 Essential (primary) hypertension: Secondary | ICD-10-CM | POA: Diagnosis not present

## 2021-02-11 DIAGNOSIS — S161XXA Strain of muscle, fascia and tendon at neck level, initial encounter: Secondary | ICD-10-CM

## 2021-02-11 DIAGNOSIS — M546 Pain in thoracic spine: Secondary | ICD-10-CM | POA: Insufficient documentation

## 2021-02-11 DIAGNOSIS — S299XXA Unspecified injury of thorax, initial encounter: Secondary | ICD-10-CM | POA: Diagnosis not present

## 2021-02-11 DIAGNOSIS — T1490XA Injury, unspecified, initial encounter: Secondary | ICD-10-CM

## 2021-02-11 DIAGNOSIS — N281 Cyst of kidney, acquired: Secondary | ICD-10-CM | POA: Diagnosis not present

## 2021-02-11 DIAGNOSIS — Z87891 Personal history of nicotine dependence: Secondary | ICD-10-CM | POA: Diagnosis not present

## 2021-02-11 DIAGNOSIS — Z794 Long term (current) use of insulin: Secondary | ICD-10-CM | POA: Insufficient documentation

## 2021-02-11 DIAGNOSIS — Z041 Encounter for examination and observation following transport accident: Secondary | ICD-10-CM | POA: Diagnosis not present

## 2021-02-11 DIAGNOSIS — M545 Low back pain, unspecified: Secondary | ICD-10-CM | POA: Insufficient documentation

## 2021-02-11 DIAGNOSIS — R0789 Other chest pain: Secondary | ICD-10-CM | POA: Diagnosis not present

## 2021-02-11 DIAGNOSIS — Y92410 Unspecified street and highway as the place of occurrence of the external cause: Secondary | ICD-10-CM | POA: Diagnosis not present

## 2021-02-11 DIAGNOSIS — R072 Precordial pain: Secondary | ICD-10-CM | POA: Diagnosis not present

## 2021-02-11 LAB — CBC
HCT: 39.5 % (ref 36.0–46.0)
Hemoglobin: 13 g/dL (ref 12.0–15.0)
MCH: 28.4 pg (ref 26.0–34.0)
MCHC: 32.9 g/dL (ref 30.0–36.0)
MCV: 86.4 fL (ref 80.0–100.0)
Platelets: 353 10*3/uL (ref 150–400)
RBC: 4.57 MIL/uL (ref 3.87–5.11)
RDW: 12.6 % (ref 11.5–15.5)
WBC: 13.6 10*3/uL — ABNORMAL HIGH (ref 4.0–10.5)
nRBC: 0 % (ref 0.0–0.2)

## 2021-02-11 LAB — COMPREHENSIVE METABOLIC PANEL
ALT: 62 U/L — ABNORMAL HIGH (ref 0–44)
AST: 49 U/L — ABNORMAL HIGH (ref 15–41)
Albumin: 3.4 g/dL — ABNORMAL LOW (ref 3.5–5.0)
Alkaline Phosphatase: 141 U/L — ABNORMAL HIGH (ref 38–126)
Anion gap: 12 (ref 5–15)
BUN: 10 mg/dL (ref 6–20)
CO2: 22 mmol/L (ref 22–32)
Calcium: 9 mg/dL (ref 8.9–10.3)
Chloride: 104 mmol/L (ref 98–111)
Creatinine, Ser: 0.87 mg/dL (ref 0.44–1.00)
GFR, Estimated: >60 mL/min (ref >60)
Glucose, Bld: 156 mg/dL — ABNORMAL HIGH (ref 70–99)
Potassium: 3.2 mmol/L — ABNORMAL LOW (ref 3.5–5.1)
Sodium: 138 mmol/L (ref 135–145)
Total Bilirubin: 0.2 mg/dL — ABNORMAL LOW (ref 0.3–1.2)
Total Protein: 6.8 g/dL (ref 6.5–8.1)

## 2021-02-11 LAB — ETHANOL: Alcohol, Ethyl (B): <10 mg/dL (ref <10)

## 2021-02-11 LAB — PROTIME-INR
INR: 1 (ref 0.8–1.2)
Prothrombin Time: 13.2 seconds (ref 11.4–15.2)
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
## 2021-02-11 MED ORDER — METHOCARBAMOL 500 MG PO TABS
1000.0000 mg | ORAL_TABLET | Freq: Once | ORAL | Status: AC
  Administered 2021-02-11: 1000 mg via ORAL
  Filled 2021-02-11: qty 2

## 2021-02-11 MED ORDER — SENNOSIDES-DOCUSATE SODIUM 8.6-50 MG PO TABS: 1.0000 | ORAL_TABLET | Freq: Every evening | ORAL | 0 refills | Status: DC | PRN

## 2021-02-11 MED ORDER — DICLOFENAC SODIUM 1 % EX GEL: 2.0000 g | Freq: Four times a day (QID) | CUTANEOUS | 0 refills | Status: AC

## 2021-02-11 MED ORDER — FENTANYL CITRATE (PF) 100 MCG/2ML IJ SOLN
100.0000 ug | Freq: Once | INTRAMUSCULAR | Status: AC
Start: 2021-02-11 — End: 2021-02-11
  Administered 2021-02-11: 100 ug via INTRAVENOUS
  Filled 2021-02-11: qty 2

## 2021-02-11 MED ORDER — OXYCODONE-ACETAMINOPHEN 5-325 MG PO TABS: 1.0000 | ORAL_TABLET | Freq: Four times a day (QID) | ORAL | 0 refills | Status: DC | PRN

## 2021-02-11 MED ORDER — ONDANSETRON HCL 4 MG/2ML IJ SOLN
4.0000 mg | Freq: Once | INTRAMUSCULAR | Status: AC
  Administered 2021-02-11: 4 mg via INTRAVENOUS
  Filled 2021-02-11: qty 2

## 2021-02-11 MED ORDER — IOHEXOL 300 MG/ML  SOLN
100.0000 mL | Freq: Once | INTRAMUSCULAR | Status: AC | PRN
  Administered 2021-02-11: 100 mL via INTRAVENOUS

## 2021-02-11 MED ORDER — IBUPROFEN 800 MG PO TABS: 800.0000 mg | ORAL_TABLET | Freq: Three times a day (TID) | ORAL | 0 refills | Status: DC | PRN

## 2021-02-11 MED ORDER — FENTANYL CITRATE (PF) 100 MCG/2ML IJ SOLN
50.0000 ug | Freq: Once | INTRAMUSCULAR | Status: AC
Start: 2021-02-11 — End: 2021-02-11
  Administered 2021-02-11: 50 ug via INTRAVENOUS
  Filled 2021-02-11: qty 2

## 2021-02-11 MED ORDER — METHOCARBAMOL 500 MG PO TABS: 500.0000 mg | ORAL_TABLET | Freq: Three times a day (TID) | ORAL | 0 refills | Status: DC | PRN

## 2021-02-11 MED ORDER — KETOROLAC TROMETHAMINE 30 MG/ML IJ SOLN
30.0000 mg | Freq: Once | INTRAMUSCULAR | Status: AC
  Administered 2021-02-11: 30 mg via INTRAVENOUS
  Filled 2021-02-11: qty 1
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
## 2021-02-11 NOTE — ED Notes (Signed)
Patient transported to CT 

## 2021-02-12 ENCOUNTER — Other Ambulatory Visit: Payer: Self-pay

## 2021-02-12 ENCOUNTER — Emergency Department (HOSPITAL_COMMUNITY): Payer: Medicare Other

## 2021-02-12 ENCOUNTER — Emergency Department (HOSPITAL_COMMUNITY)
Admission: EM | Admit: 2021-02-12 | Discharge: 2021-02-12 | Disposition: A | Discharge status: Home / Self Care | Payer: Medicare Other | Attending: Emergency Medicine | Admitting: Emergency Medicine

## 2021-02-12 ENCOUNTER — Encounter (HOSPITAL_COMMUNITY): Payer: Self-pay

## 2021-02-12 DIAGNOSIS — Z794 Long term (current) use of insulin: Secondary | ICD-10-CM | POA: Diagnosis not present

## 2021-02-12 DIAGNOSIS — M549 Dorsalgia, unspecified: Secondary | ICD-10-CM | POA: Diagnosis not present

## 2021-02-12 DIAGNOSIS — K7689 Other specified diseases of liver: Secondary | ICD-10-CM | POA: Diagnosis not present

## 2021-02-12 DIAGNOSIS — R519 Headache, unspecified: Secondary | ICD-10-CM | POA: Insufficient documentation

## 2021-02-12 DIAGNOSIS — S299XXA Unspecified injury of thorax, initial encounter: Secondary | ICD-10-CM | POA: Diagnosis not present

## 2021-02-12 DIAGNOSIS — Z041 Encounter for examination and observation following transport accident: Secondary | ICD-10-CM | POA: Diagnosis not present

## 2021-02-12 DIAGNOSIS — S161XXA Strain of muscle, fascia and tendon at neck level, initial encounter: Secondary | ICD-10-CM | POA: Diagnosis not present

## 2021-02-12 DIAGNOSIS — R079 Chest pain, unspecified: Secondary | ICD-10-CM | POA: Diagnosis present

## 2021-02-12 DIAGNOSIS — E119 Type 2 diabetes mellitus without complications: Secondary | ICD-10-CM | POA: Insufficient documentation

## 2021-02-12 DIAGNOSIS — M542 Cervicalgia: Secondary | ICD-10-CM | POA: Insufficient documentation

## 2021-02-12 DIAGNOSIS — Y92410 Unspecified street and highway as the place of occurrence of the external cause: Secondary | ICD-10-CM | POA: Diagnosis not present

## 2021-02-12 DIAGNOSIS — S39012A Strain of muscle, fascia and tendon of lower back, initial encounter: Secondary | ICD-10-CM | POA: Diagnosis not present

## 2021-02-12 DIAGNOSIS — Z7984 Long term (current) use of oral hypoglycemic drugs: Secondary | ICD-10-CM | POA: Insufficient documentation

## 2021-02-12 DIAGNOSIS — M545 Low back pain, unspecified: Secondary | ICD-10-CM | POA: Diagnosis not present

## 2021-02-12 DIAGNOSIS — N281 Cyst of kidney, acquired: Secondary | ICD-10-CM | POA: Diagnosis not present

## 2021-02-12 DIAGNOSIS — S3993XA Unspecified injury of pelvis, initial encounter: Secondary | ICD-10-CM | POA: Diagnosis not present

## 2021-02-12 DIAGNOSIS — R072 Precordial pain: Secondary | ICD-10-CM | POA: Insufficient documentation

## 2021-02-12 DIAGNOSIS — Z79899 Other long term (current) drug therapy: Secondary | ICD-10-CM | POA: Diagnosis not present

## 2021-02-12 DIAGNOSIS — Z87891 Personal history of nicotine dependence: Secondary | ICD-10-CM | POA: Insufficient documentation

## 2021-02-12 DIAGNOSIS — J9811 Atelectasis: Secondary | ICD-10-CM | POA: Diagnosis not present

## 2021-02-12 DIAGNOSIS — J9859 Other diseases of mediastinum, not elsewhere classified: Secondary | ICD-10-CM | POA: Diagnosis not present

## 2021-02-12 DIAGNOSIS — Z91040 Latex allergy status: Secondary | ICD-10-CM | POA: Insufficient documentation
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
## 2021-02-12 MED ORDER — LIDOCAINE 5 % EX PTCH: 1.0000 | MEDICATED_PATCH | CUTANEOUS | 0 refills | Status: DC
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
## 2021-02-27 DIAGNOSIS — S134XXA Sprain of ligaments of cervical spine, initial encounter: Secondary | ICD-10-CM | POA: Diagnosis not present

## 2021-02-27 DIAGNOSIS — M47892 Other spondylosis, cervical region: Secondary | ICD-10-CM | POA: Diagnosis not present

## 2021-03-01 ENCOUNTER — Ambulatory Visit: Payer: BLUE CROSS/BLUE SHIELD | Admitting: Internal Medicine

## 2021-03-01 ENCOUNTER — Ambulatory Visit: Payer: BLUE CROSS/BLUE SHIELD | Admitting: Dietician

## 2021-03-19 ENCOUNTER — Other Ambulatory Visit: Payer: Self-pay | Admitting: Obstetrics & Gynecology

## 2021-03-19 DIAGNOSIS — Z1231 Encounter for screening mammogram for malignant neoplasm of breast: Secondary | ICD-10-CM

## 2021-03-22 ENCOUNTER — Encounter (HOSPITAL_COMMUNITY): Payer: Self-pay

## 2021-03-22 ENCOUNTER — Ambulatory Visit
Admission: RE | Admit: 2021-03-22 | Discharge: 2021-03-22 | Disposition: A | Discharge status: Home / Self Care | Payer: Medicare Other | Source: Ambulatory Visit | Attending: Obstetrics & Gynecology | Admitting: Obstetrics & Gynecology

## 2021-03-22 ENCOUNTER — Other Ambulatory Visit: Payer: Self-pay

## 2021-03-22 ENCOUNTER — Ambulatory Visit (HOSPITAL_COMMUNITY)
Admission: EM | Admit: 2021-03-22 | Discharge: 2021-03-22 | Disposition: A | Discharge status: Home / Self Care | Payer: Medicare Other | Attending: Student | Admitting: Student

## 2021-03-22 DIAGNOSIS — Z1231 Encounter for screening mammogram for malignant neoplasm of breast: Secondary | ICD-10-CM

## 2021-03-22 DIAGNOSIS — N3001 Acute cystitis with hematuria: Secondary | ICD-10-CM | POA: Diagnosis not present

## 2021-03-22 LAB — POCT URINALYSIS DIPSTICK, ED / UC
Bilirubin Urine: NEGATIVE
Glucose, UA: >=1000 mg/dL — AB
Ketones, ur: NEGATIVE mg/dL
Nitrite: NEGATIVE
Protein, ur: NEGATIVE mg/dL
Specific Gravity, Urine: 1.015 (ref 1.005–1.030)
Urobilinogen, UA: 0.2 mg/dL (ref 0.0–1.0)
pH: 7 (ref 5.0–8.0)

## 2021-03-22 IMAGING — MG MM DIGITAL SCREENING BILAT W/ TOMO AND CAD
8 series · 8 of 24 positions shown · non-contrast
Comparison: Previous exam(s).

CLINICAL DATA: Screening.

EXAM:
DIGITAL SCREENING BILATERAL MAMMOGRAM WITH TOMOSYNTHESIS AND CAD
TECHNIQUE: Bilateral screening digital craniocaudal and mediolateral oblique
mammograms were obtained. Bilateral screening digital breast
tomosynthesis was performed. The images were evaluated with
computer-aided detection.

[R MLO synth-2D]
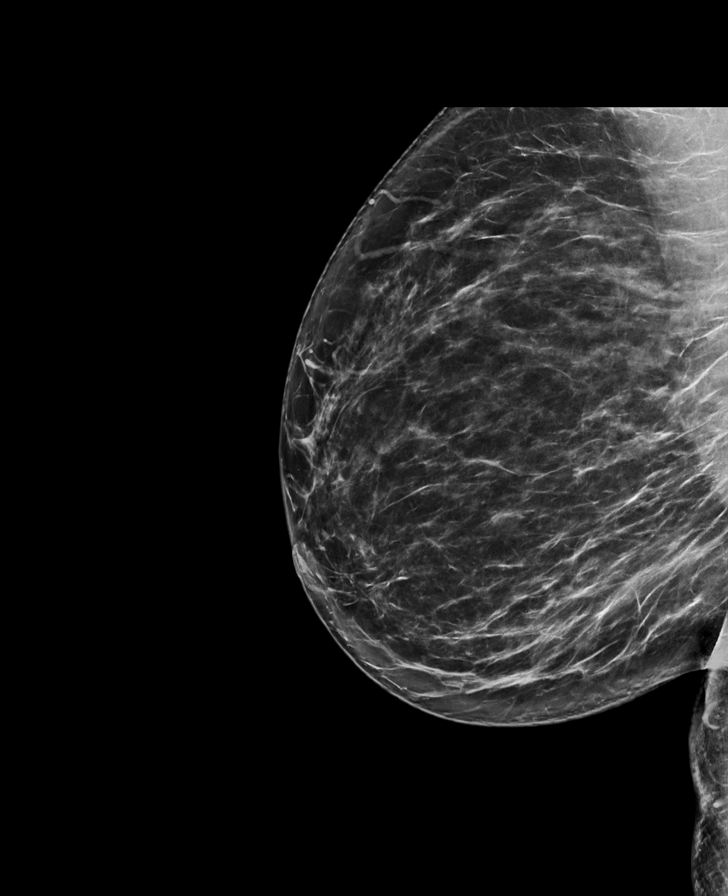

[L MLO synth-2D]
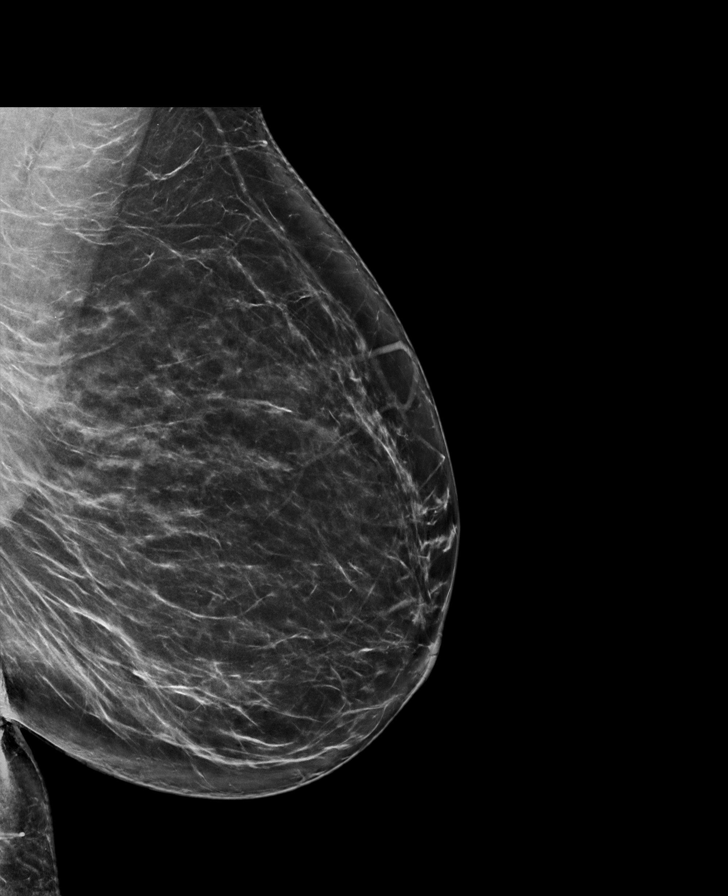

[R CC synth-2D]
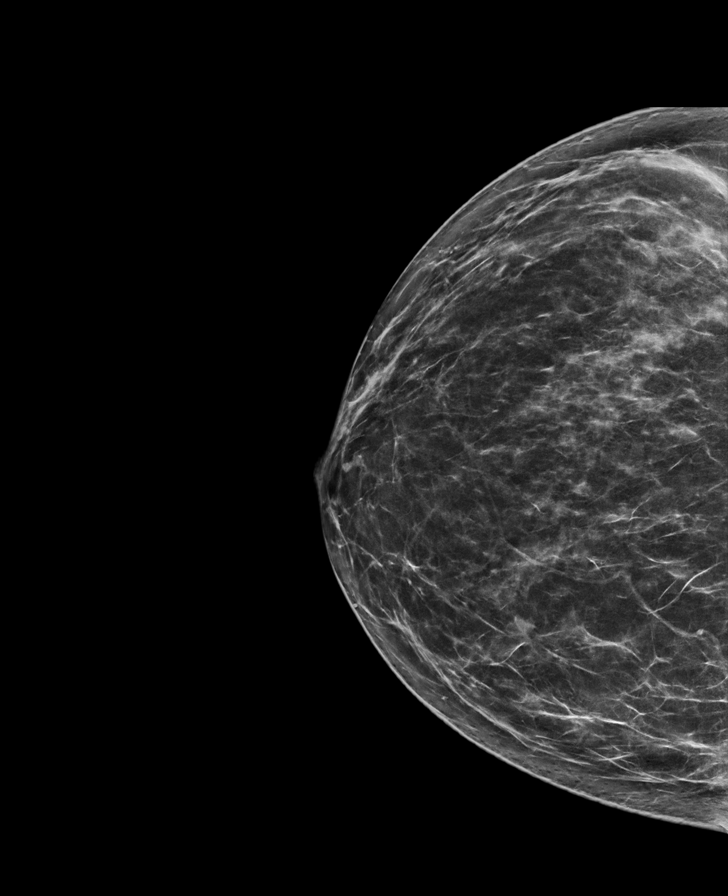

[L CC synth-2D]
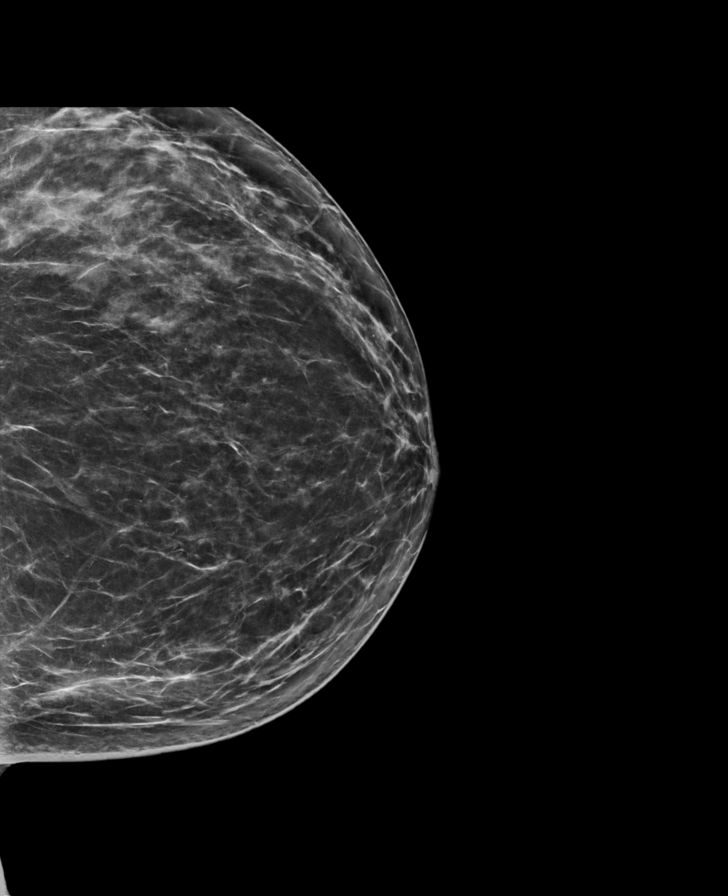

[L MLO tomo · tomo slice 44/87.0]
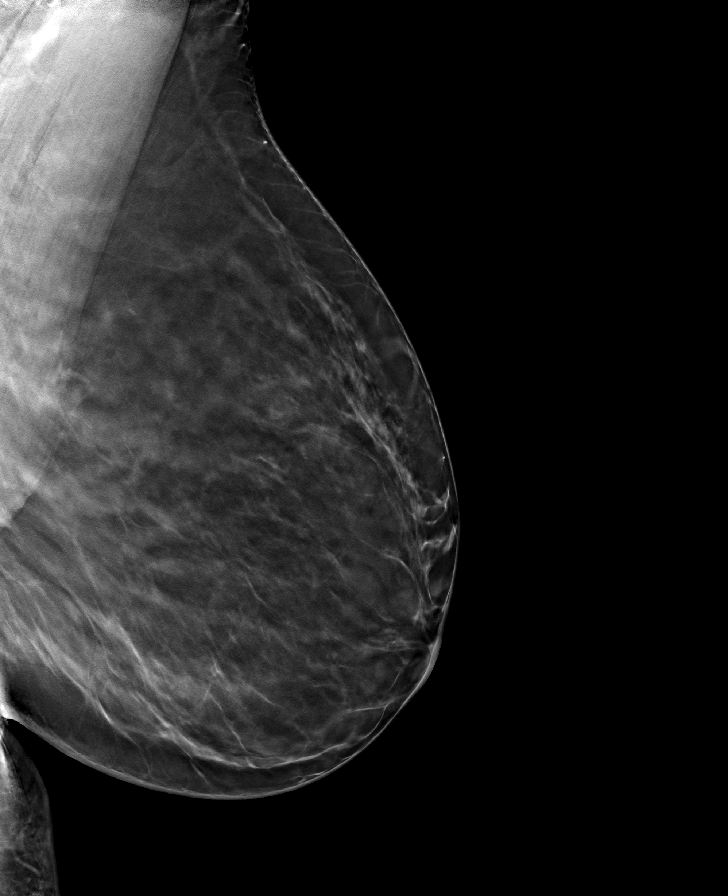

[L CC tomo · tomo slice 39/77.0]
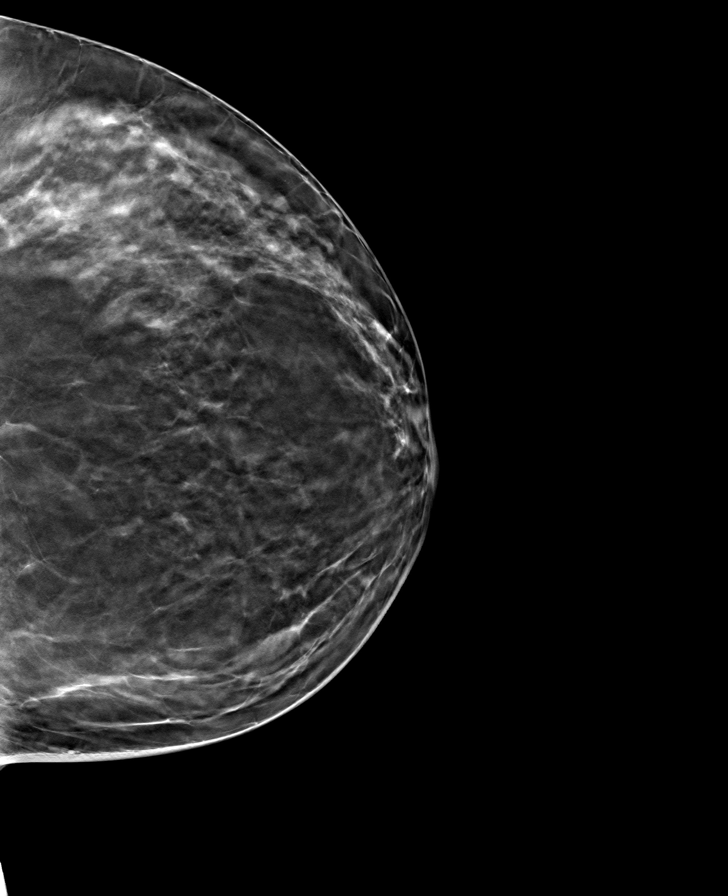

[R MLO tomo · tomo slice 43/85.0]
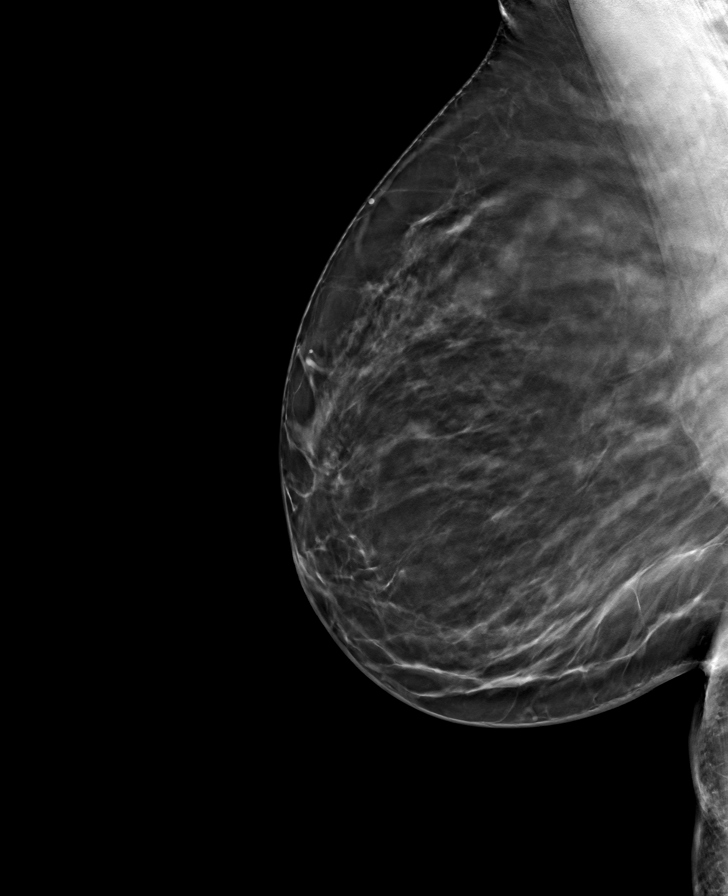

[R CC tomo · tomo slice 37/73.0]
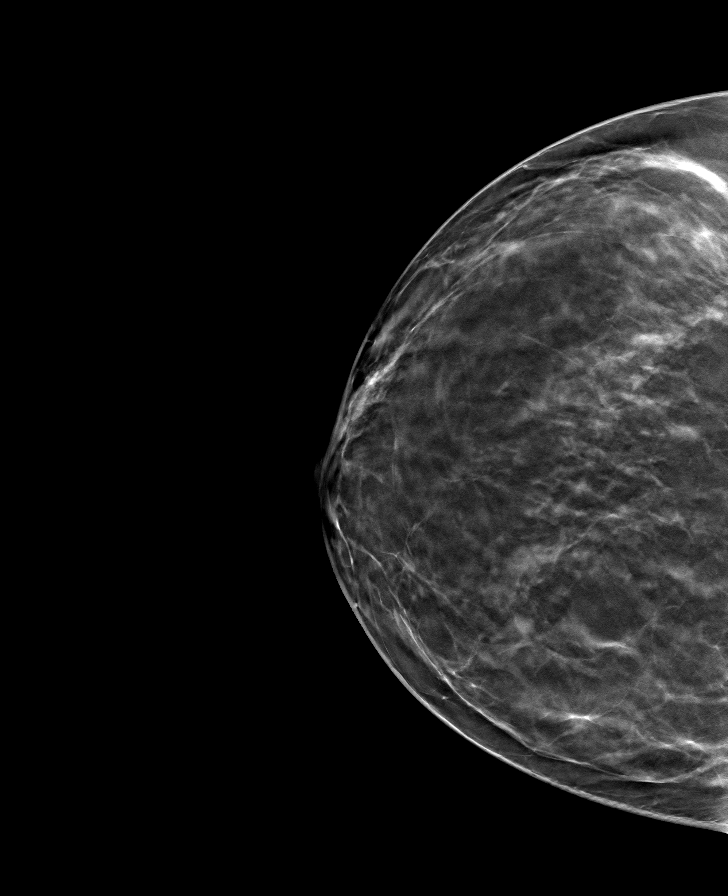

[8 of 24 positions shown; findings below may reference images not displayed]

ACR Breast Density Category b: There are scattered areas of
fibroglandular density.
FINDINGS: There are no findings suspicious for malignancy.
IMPRESSION: No mammographic evidence of malignancy. A result letter of this
screening mammogram will be mailed directly to the patient.

RECOMMENDATION:
Screening mammogram in one year. (Code:[BY])

BI-RADS CATEGORY  1: Negative.

## 2021-03-22 MED ORDER — NITROFURANTOIN MONOHYD MACRO 100 MG PO CAPS: 100.0000 mg | ORAL_CAPSULE | Freq: Two times a day (BID) | ORAL | 0 refills | Status: DC

## 2021-03-22 NOTE — Discharge Instructions (Addendum)
-  Macrobid twice daily x5 days -Drink plenty of fluids -We'll call you if the urine culture shows we need to change anything -Seek additional medical attention if symptoms get worse instead of better, like abdominal pain, back pain, fever/chills.

## 2021-03-22 NOTE — ED Triage Notes (Signed)
Patient presents to Urgent Care with complaints of abdominal pain and dysuria x 4 days. Pt has a hx of UTI. Treating pain with tylenol.   Denies fever, vomiting, or diarrhea.

## 2021-03-22 NOTE — ED Provider Notes (Signed)
[place]    CSN: [id number] Arrival date & time: 03/22/21  1045      History   Chief Complaint Chief Complaint  Patient presents with   Abdominal Pain   Dysuria    HPI Beverly Ramirez is a 60 y.o. female presenting with urinary symptoms.  Medical history UTI.  Also with history diabetes, degenerative disc disease.  Notes suprapubic pressure and dysuria for 4 days, these are her typical UTI symptoms.  Tylenol helps pain somewhat. Denies hematuria, urgency, back pain, n/v/d/, fevers/chills, abdnormal vaginal discharge.      HPI  Past Medical History:  Diagnosis Date   Anemia    Carpal tunnel syndrome    bilateral   Chronic heel pain    Diabetes mellitus without complication (HCC)    DJD (degenerative joint disease) of cervical spine    MRI 2017   Hyperlipidemia    Plantar fasciitis    bilateral   Sickle cell anemia (HCC)    sickle cell trait   Sleep apnea    Tendonitis    left hand/wrist   UC (ulcerative colitis) ([place])     Patient Active Problem List   Diagnosis Date Noted   Type 2 diabetes mellitus with hyperglycemia, with long-term current use of insulin ([place]) 05/31/2020   Fibromyalgia 05/31/2020   Mouth ulcers 05/25/2020   Elevated glucose 12/23/2019   Diabetic ketoacidosis without coma associated with type 2 diabetes mellitus ([place]) 12/23/2019   De Quervain's tenosynovitis, left 11/16/2018   Bilateral carpal tunnel syndrome 04/13/2018   Leukocytosis 10/04/2017   Hyperlipidemia 08/04/2017   Ulcerative colitis with complication ([place]) 73/53/2992   Plantar fasciitis 05/28/2017    Past Surgical History:  Procedure Laterality Date   ABDOMINAL HYSTERECTOMY     APPENDECTOMY     CESAREAN SECTION     x4   COLONOSCOPY     Multiple in [place]   ESOPHAGOGASTRODUODENOSCOPY      OB History     Gravida  4   Para  4   Term      Preterm      AB      Living  4      SAB      IAB      Ectopic      Multiple      Live  Births               Home Medications    Prior to Admission medications   Medication Sig Start Date End Date Taking? Authorizing Provider  montelukast (SINGULAIR) 10 MG tablet TAKE 1 TABLET BY MOUTH EVERYDAY AT BEDTIME 11/27/20   Just, Laurita Quint, FNP  nitrofurantoin, macrocrystal-monohydrate, (MACROBID) 100 MG capsule Take 1 capsule (100 mg total) by mouth 2 (two) times daily. 03/22/21  Yes Hazel Sams, PA-C  albuterol (VENTOLIN HFA) 108 (90 Base) MCG/ACT inhaler Inhale 2 puffs into the lungs every 6 (six) hours as needed for wheezing or shortness of breath. 03/13/20   Jacelyn Pi, Lilia Argue, MD  amLODipine (NORVASC) 5 MG tablet TAKE 1 TABLET BY MOUTH EVERY DAY 06/08/20   Jacelyn Pi, Lilia Argue, MD  atorvastatin (LIPITOR) 40 MG tablet Take 1 tablet (40 mg total) by mouth daily. 02/10/20   Jacelyn Pi, Lilia Argue, MD  dapagliflozin propanediol (FARXIGA) 10 MG TABS tablet Take 1 tablet (10 mg total) by mouth daily. 10/26/20   Shamleffer, Melanie Crazier, MD  dicyclomine (BENTYL) 20 MG tablet Take 1 tablet (20 mg total) by mouth  3 (three) times daily as needed for spasms (abdominal cramping). 10/18/20   Wendie Agreste, MD  diphenoxylate-atropine (LOMOTIL) 2.5-0.025 MG tablet Take 1 tablet by mouth 4 (four) times daily as needed for diarrhea or loose stools. 04/27/20   Gatha Mayer, MD  DULoxetine (CYMBALTA) 60 MG capsule TAKE 1 CAPSULE BY MOUTH EVERY DAY 03/20/20   Jacelyn Pi, Lilia Argue, MD  esomeprazole (NEXIUM) 40 MG capsule TAKE 1 CAPSULE BY MOUTH EVERY DAY BEFORE BREAKFAST 07/20/20   Gatha Mayer, MD  fluticasone [facility]) 50 MCG/ACT nasal spray SPRAY 2 SPRAYS INTO EACH NOSTRIL EVERY DAY Patient taking differently: Patient takes as needed 03/08/20   Jacelyn Pi, Lilia Argue, MD  glucose blood (ONETOUCH VERIO) test strip Check blood glucose 3-4 x day. Dx E11.9, E11.649, z79.4, 11/06/20   Maximiano Coss, NP  ibuprofen (ADVIL) 800 MG tablet Take 1 tablet (800 mg total) by mouth every 8 (eight) hours as  needed for moderate pain. 02/11/21   Long, Wonda Olds, MD  insulin aspart (NOVOLOG FLEXPEN) 100 UNIT/ML FlexPen Inject 22 Units into the skin 3 (three) times daily with meals. 07/23/20   Shamleffer, Melanie Crazier, MD  Insulin Glargine (BASAGLAR KWIKPEN) 100 UNIT/ML Inject 32 Units into the skin at bedtime. 07/23/20   Shamleffer, Melanie Crazier, MD  Insulin Pen Needle 32G X 4 MM MISC 1 Device by Does not apply route in the morning, at noon, in the evening, and at bedtime. 07/23/20   Shamleffer, Melanie Crazier, MD  Lancets [facility] DELICA PLUS [id number]) MISC 1 each by Does not apply route 3 (three) times daily. Dx E11.65, z79.4 01/24/19   Jacelyn Pi, Lilia Argue, MD  lidocaine (LIDODERM) 5 % Place 1 patch onto the skin daily. Remove & Discard patch within 12 hours or as directed by MD 02/12/21   Loni Beckwith, PA-C  lidocaine (XYLOCAINE) 2 % solution Use as directed 15 mLs in the mouth or throat as needed for mouth pain. 03/09/20   Gatha Mayer, MD  mesalamine (LIALDA) 1.2 g EC tablet Take 2 tablets (2.4 g total) by mouth 2 (two) times daily. 04/28/20 04/23/21  Gatha Mayer, MD  methocarbamol (ROBAXIN) 500 MG tablet Take 1 tablet (500 mg total) by mouth every 8 (eight) hours as needed for muscle spasms. 02/11/21   Long, Wonda Olds, MD  Multiple Vitamins-Minerals (WOMENS 50+ MULTI VITAMIN/MIN PO) Take by mouth.    [provider]  NONFORMULARY OR COMPOUNDED ITEM Swish and spit 5 mLs as needed (ulcer flare up in mouth). Magic mouthwash with lidocaine disp#150 ml 05/04/20   Gatha Mayer, MD  ondansetron (ZOFRAN ODT) 4 MG disintegrating tablet Take 1 tablet (4 mg total) by mouth every 8 (eight) hours as needed for nausea or vomiting. 05/08/20   Long, Wonda Olds, MD  oxyCODONE-acetaminophen (PERCOCET/ROXICET) 5-325 MG tablet Take 1 tablet by mouth every 6 (six) hours as needed for severe pain. 02/11/21   Long, Wonda Olds, MD  senna-docusate (SENOKOT-S) 8.6-50 MG tablet Take 1 tablet by mouth at bedtime  as needed for mild constipation or moderate constipation. 02/11/21   Long, Wonda Olds, MD  triamcinolone cream (KENALOG) 0.1 % Apply 1 application topically 2 (two) times daily. 04/19/20   Daleen Squibb, MD    Family History Family History  Problem Relation Age of Onset   Stroke Mother    Diabetes Mother    Hypertension Mother    Lung cancer Mother    Sickle cell trait Mother  Diabetes Maternal Aunt    Lung cancer Maternal Aunt    Lung cancer Maternal Aunt    Lung cancer Maternal Aunt    Colon cancer Cousin 30   Sickle cell trait Father    Sickle cell anemia Sister 32   Lung cancer Maternal Grandmother    Sickle cell anemia Sister    Other Sister        accident and blood clot formed    Social History Social History   Tobacco Use   Smoking status: Former    Types: Cigarettes    Quit date: 10/13/2006    Years since quitting: 14.4   Smokeless tobacco: Never  Vaping Use   Vaping Use: Never used  Substance Use Topics   Alcohol use: Yes    Comment: occasional   Drug use: No     Allergies   Sulfa antibiotics, Sulfamethoxazole-trimethoprim, Codeine, and Latex   Review of Systems Review of Systems  Constitutional:  Negative for appetite change, chills, diaphoresis and fever.  Respiratory:  Negative for shortness of breath.   Cardiovascular:  Negative for chest pain.  Gastrointestinal:  Positive for abdominal pain. Negative for blood in stool, constipation, diarrhea, nausea and vomiting.  Genitourinary:  Positive for dysuria and frequency. Negative for decreased urine volume, difficulty urinating, flank pain, genital sores, hematuria and urgency.  Musculoskeletal:  Negative for back pain.  Neurological:  Negative for dizziness, weakness and light-headedness.  All other systems reviewed and are negative.   Physical Exam Triage Vital Signs ED Triage Vitals  Enc Vitals Group     BP 03/22/21 1138 (!) 150/93     Pulse Rate 03/22/21 1138 99     Resp 03/22/21 1138  18     Temp 03/22/21 1138 98.1 F (36.7 C)     Temp Source 03/22/21 1138 Oral     SpO2 03/22/21 1138 99 %     Weight --      Height --      Head Circumference --      Peak Flow --      Pain Score 03/22/21 1134 6     Pain Loc --      Pain Edu? --      Excl. in [place]? --    No data found.  Updated Vital Signs BP (!) 150/93 (BP Location: Right Arm)   Pulse 99   Temp 98.1 F (36.7 C) (Oral)   Resp 18   SpO2 99%   Visual Acuity Right Eye Distance:   Left Eye Distance:   Bilateral Distance:    Right Eye Near:   Left Eye Near:    Bilateral Near:     Physical Exam Vitals reviewed.  Constitutional:      General: She is not in acute distress.    Appearance: Normal appearance. She is not ill-appearing.  HENT:     Head: Normocephalic and atraumatic.     Mouth/Throat:     Mouth: Mucous membranes are moist.     Comments: Moist mucous membranes Eyes:     Extraocular Movements: Extraocular movements intact.     Pupils: Pupils are equal, round, and reactive to light.  Cardiovascular:     Rate and Rhythm: Normal rate and regular rhythm.     Heart sounds: Normal heart sounds.  Pulmonary:     Effort: Pulmonary effort is normal.     Breath sounds: Normal breath sounds. No wheezing, rhonchi or rales.  Abdominal:     General: Bowel sounds are  normal. There is no distension.     Palpations: Abdomen is soft. There is no mass.     Tenderness: There is abdominal tenderness in the suprapubic area. There is no right CVA tenderness, left CVA tenderness, guarding or rebound. Negative signs include Murphy's sign, Rovsing's sign and McBurney's sign.  Skin:    General: Skin is warm.     Capillary Refill: Capillary refill takes less than 2 seconds.     Comments: Good skin turgor  Neurological:     General: No focal deficit present.     Mental Status: She is alert and oriented to person, place, and time.  Psychiatric:        Mood and Affect: Mood normal.        Behavior: Behavior normal.      UC Treatments / Results  Labs (all labs ordered are listed, but only abnormal results are displayed) Labs Reviewed  POCT URINALYSIS DIPSTICK, ED / UC - Abnormal; Notable for the following components:      Result Value   Glucose, UA >=1000 (*)    Hgb urine dipstick TRACE (*)    Leukocytes,Ua TRACE (*)    All other components within normal limits  URINE CULTURE    EKG   Radiology No results found.  Procedures Procedures (including critical care time)  Medications Ordered in UC Medications - No data to display  Initial Impression / Assessment and Plan / UC Course  I have reviewed the triage vital signs and the nursing notes.  Pertinent labs & imaging results that were available during my care of the patient were reviewed by me and considered in my medical decision making (see chart for details).     This patient is a very pleasant 60 y.o. year old female presenting with acute cystitis with hematuria. Afebrile, nontachy, no abt pain or CVAT. Last UTI was 11/06/20.  UA with trace blood, trace leuk. Culture sent.  Hysterectomy and post-menopausal.  She is penicillin and sulfa allergic so will treat with Macrobid as below.  Coding this visit a Level 4 for review of past labs and notes, order and interpretation of labs today, and prescription drug management.  Final Clinical Impressions(s) / UC Diagnoses   Final diagnoses:  Acute cystitis with hematuria     Discharge Instructions      -Macrobid twice daily x5 days -Drink plenty of fluids -We'll call you if the urine culture shows we need to change anything -Seek additional medical attention if symptoms get worse instead of better, like abdominal pain, back pain, fever/chills.     ED Prescriptions     Medication Sig Dispense Auth. Provider   nitrofurantoin, macrocrystal-monohydrate, (MACROBID) 100 MG capsule Take 1 capsule (100 mg total) by mouth 2 (two) times daily. 10 capsule Hazel Sams, PA-C       PDMP not reviewed this encounter.   Hazel Sams, PA-C 03/22/21 1224

## 2021-03-24 LAB — URINE CULTURE: Culture: 20000 — AB

## 2021-03-25 DIAGNOSIS — M5481 Occipital neuralgia: Secondary | ICD-10-CM | POA: Diagnosis not present

## 2021-03-25 DIAGNOSIS — M542 Cervicalgia: Secondary | ICD-10-CM | POA: Diagnosis not present

## 2021-03-25 DIAGNOSIS — G4486 Cervicogenic headache: Secondary | ICD-10-CM | POA: Diagnosis not present

## 2021-03-25 DIAGNOSIS — M47892 Other spondylosis, cervical region: Secondary | ICD-10-CM | POA: Diagnosis not present

## 2021-03-28 ENCOUNTER — Telehealth: Payer: Self-pay | Admitting: Internal Medicine
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
## 2021-03-28 NOTE — Telephone Encounter (Signed)
It does not sound like the symptoms are related to her ulcerative colitis  It does seem very possible it could be medication side effects   She would need to have that evaluated with the prescribers of those medications or a primary care provider she may schedule a follow-up with me as well but I do not think this is her ulcerative colitis causing this

## 2021-03-29 ENCOUNTER — Other Ambulatory Visit: Payer: Self-pay | Admitting: Rehabilitation

## 2021-03-29 DIAGNOSIS — M47892 Other spondylosis, cervical region: Secondary | ICD-10-CM

## 2021-03-29 NOTE — Telephone Encounter (Signed)
Patient is calling to follow up on previous message

## 2021-03-29 NOTE — Telephone Encounter (Signed)
I left a detailed message for the patient with Dr. Celesta Aver response

## 2021-04-02 ENCOUNTER — Other Ambulatory Visit: Payer: Self-pay

## 2021-04-02 ENCOUNTER — Ambulatory Visit (HOSPITAL_COMMUNITY)
Admission: RE | Admit: 2021-04-02 | Discharge: 2021-04-02 | Disposition: A | Discharge status: Home / Self Care | Payer: Medicare Other | Source: Ambulatory Visit | Attending: Internal Medicine | Admitting: Internal Medicine

## 2021-04-02 ENCOUNTER — Encounter (HOSPITAL_COMMUNITY): Payer: Self-pay

## 2021-04-02 VITALS — BP 150/93 | HR 81 | Temp 98.1°F | Resp 16

## 2021-04-02 DIAGNOSIS — Z794 Long term (current) use of insulin: Secondary | ICD-10-CM | POA: Diagnosis not present

## 2021-04-02 DIAGNOSIS — R81 Glycosuria: Secondary | ICD-10-CM | POA: Insufficient documentation

## 2021-04-02 DIAGNOSIS — R300 Dysuria: Secondary | ICD-10-CM | POA: Diagnosis not present

## 2021-04-02 DIAGNOSIS — E1165 Type 2 diabetes mellitus with hyperglycemia: Secondary | ICD-10-CM | POA: Diagnosis not present

## 2021-04-02 LAB — POCT URINALYSIS DIPSTICK, ED / UC
Bilirubin Urine: NEGATIVE
Glucose, UA: 500 mg/dL — AB
Ketones, ur: NEGATIVE mg/dL
Leukocytes,Ua: NEGATIVE
Nitrite: NEGATIVE
Protein, ur: NEGATIVE mg/dL
Specific Gravity, Urine: 1.015 (ref 1.005–1.030)
Urobilinogen, UA: 0.2 mg/dL (ref 0.0–1.0)
pH: 6 (ref 5.0–8.0)

## 2021-04-02 MED ORDER — PHENAZOPYRIDINE HCL 200 MG PO TABS: 200.0000 mg | ORAL_TABLET | Freq: Three times a day (TID) | ORAL | 0 refills | Status: DC | PRN

## 2021-04-02 NOTE — ED Triage Notes (Signed)
Pt presents with lower abdominal pain and dysuria Xs 4 days. States States completed the antibiotic from visit on 7/15.

## 2021-04-02 NOTE — ED Provider Notes (Signed)
[facility]    CSN: [id number] Arrival date & time: 04/02/21  1100      History   Chief Complaint Chief Complaint  Patient presents with   Abdominal Pain   Dysuria    HPI Beverly Ramirez is a 60 y.o. female.   Patient presenting today with 4-day history of dysuria, lower abdominal pressure.  Denies hematuria, fevers, nausea, vomiting, vaginal discharge or itching, irritation, bowel changes.  Was treated almost 2 weeks ago with Macrobid for a mild UTI, states the symptoms improved but came right back.  Does have a history of insulin-dependent diabetes, states her sugars are a bit higher recently than they usually would be, usually around 103 and now she has been running around 1 60-1 80.  Compliant with her medication regimen.  Has been cutting back on sodas and trying to drink more water in hopes of helping symptoms.  Not try anything over-the-counter at this time for symptoms.   Past Medical History:  Diagnosis Date   Anemia    Carpal tunnel syndrome    bilateral   Chronic heel pain    Diabetes mellitus without complication (HCC)    DJD (degenerative joint disease) of cervical spine    MRI 2017   Hyperlipidemia    Plantar fasciitis    bilateral   Sickle cell anemia (HCC)    sickle cell trait   Sleep apnea    Tendonitis    left hand/wrist   UC (ulcerative colitis) ([place])     Patient Active Problem List   Diagnosis Date Noted   Type 2 diabetes mellitus with hyperglycemia, with long-term current use of insulin ([place]) 05/31/2020   Fibromyalgia 05/31/2020   Mouth ulcers 05/25/2020   Elevated glucose 12/23/2019   Diabetic ketoacidosis without coma associated with type 2 diabetes mellitus ([place]) 12/23/2019   De Quervain's tenosynovitis, left 11/16/2018   Bilateral carpal tunnel syndrome 04/13/2018   Leukocytosis 10/04/2017   Hyperlipidemia 08/04/2017   Ulcerative colitis with complication ([place]) 67/34/1936   Plantar fasciitis 05/28/2017    Past Surgical  History:  Procedure Laterality Date   ABDOMINAL HYSTERECTOMY     APPENDECTOMY     CESAREAN SECTION     x4   COLONOSCOPY     Multiple in [place]   ESOPHAGOGASTRODUODENOSCOPY      OB History     Gravida  4   Para  4   Term      Preterm      AB      Living  4      SAB      IAB      Ectopic      Multiple      Live Births               Home Medications    Prior to Admission medications   Medication Sig Start Date End Date Taking? Authorizing Provider  montelukast (SINGULAIR) 10 MG tablet TAKE 1 TABLET BY MOUTH EVERYDAY AT BEDTIME 11/27/20   Just, Laurita Quint, FNP  phenazopyridine (PYRIDIUM) 200 MG tablet Take 1 tablet (200 mg total) by mouth 3 (three) times daily as needed for pain. 04/02/21  Yes Volney American, PA-C  albuterol (VENTOLIN HFA) 108 (90 Base) MCG/ACT inhaler Inhale 2 puffs into the lungs every 6 (six) hours as needed for wheezing or shortness of breath. 03/13/20   Jacelyn Pi, Lilia Argue, MD  amLODipine (NORVASC) 5 MG tablet TAKE 1 TABLET BY MOUTH EVERY DAY 06/08/20  Jacelyn Pi, Lilia Argue, MD  atorvastatin (LIPITOR) 40 MG tablet Take 1 tablet (40 mg total) by mouth daily. 02/10/20   Jacelyn Pi, Lilia Argue, MD  dapagliflozin propanediol (FARXIGA) 10 MG TABS tablet Take 1 tablet (10 mg total) by mouth daily. 10/26/20   Shamleffer, Melanie Crazier, MD  dicyclomine (BENTYL) 20 MG tablet Take 1 tablet (20 mg total) by mouth 3 (three) times daily as needed for spasms (abdominal cramping). 10/18/20   Wendie Agreste, MD  diphenoxylate-atropine (LOMOTIL) 2.5-0.025 MG tablet Take 1 tablet by mouth 4 (four) times daily as needed for diarrhea or loose stools. 04/27/20   Gatha Mayer, MD  DULoxetine (CYMBALTA) 60 MG capsule TAKE 1 CAPSULE BY MOUTH EVERY DAY 03/20/20   Jacelyn Pi, Lilia Argue, MD  esomeprazole (NEXIUM) 40 MG capsule TAKE 1 CAPSULE BY MOUTH EVERY DAY BEFORE BREAKFAST 07/20/20   Gatha Mayer, MD  fluticasone [facility]) 50 MCG/ACT nasal spray SPRAY 2  SPRAYS INTO EACH NOSTRIL EVERY DAY Patient taking differently: Patient takes as needed 03/08/20   Jacelyn Pi, Lilia Argue, MD  glucose blood (ONETOUCH VERIO) test strip Check blood glucose 3-4 x day. Dx E11.9, E11.649, z79.4, 11/06/20   Maximiano Coss, NP  ibuprofen (ADVIL) 800 MG tablet Take 1 tablet (800 mg total) by mouth every 8 (eight) hours as needed for moderate pain. 02/11/21   Long, Wonda Olds, MD  insulin aspart (NOVOLOG FLEXPEN) 100 UNIT/ML FlexPen Inject 22 Units into the skin 3 (three) times daily with meals. 07/23/20   Shamleffer, Melanie Crazier, MD  Insulin Glargine (BASAGLAR KWIKPEN) 100 UNIT/ML Inject 32 Units into the skin at bedtime. 07/23/20   Shamleffer, Melanie Crazier, MD  Insulin Pen Needle 32G X 4 MM MISC 1 Device by Does not apply route in the morning, at noon, in the evening, and at bedtime. 07/23/20   Shamleffer, Melanie Crazier, MD  Lancets [facility] DELICA PLUS [id number]) MISC 1 each by Does not apply route 3 (three) times daily. Dx E11.65, z79.4 01/24/19   Jacelyn Pi, Lilia Argue, MD  lidocaine (LIDODERM) 5 % Place 1 patch onto the skin daily. Remove & Discard patch within 12 hours or as directed by MD 02/12/21   Loni Beckwith, PA-C  lidocaine (XYLOCAINE) 2 % solution Use as directed 15 mLs in the mouth or throat as needed for mouth pain. 03/09/20   Gatha Mayer, MD  mesalamine (LIALDA) 1.2 g EC tablet Take 2 tablets (2.4 g total) by mouth 2 (two) times daily. 04/28/20 04/23/21  Gatha Mayer, MD  methocarbamol (ROBAXIN) 500 MG tablet Take 1 tablet (500 mg total) by mouth every 8 (eight) hours as needed for muscle spasms. 02/11/21   Long, Wonda Olds, MD  Multiple Vitamins-Minerals (WOMENS 50+ MULTI VITAMIN/MIN PO) Take by mouth.    [provider]  nitrofurantoin, macrocrystal-monohydrate, (MACROBID) 100 MG capsule Take 1 capsule (100 mg total) by mouth 2 (two) times daily. 03/22/21   Hazel Sams, PA-C  NONFORMULARY OR COMPOUNDED ITEM Swish and spit 5 mLs as needed  (ulcer flare up in mouth). Magic mouthwash with lidocaine disp#150 ml 05/04/20   Gatha Mayer, MD  ondansetron (ZOFRAN ODT) 4 MG disintegrating tablet Take 1 tablet (4 mg total) by mouth every 8 (eight) hours as needed for nausea or vomiting. 05/08/20   Long, Wonda Olds, MD  oxyCODONE-acetaminophen (PERCOCET/ROXICET) 5-325 MG tablet Take 1 tablet by mouth every 6 (six) hours as needed for severe pain. 02/11/21   Long, Wonda Olds, MD  senna-docusate (SENOKOT-S) 8.6-50 MG tablet Take 1 tablet by mouth at bedtime as needed for mild constipation or moderate constipation. 02/11/21   Long, Wonda Olds, MD  triamcinolone cream (KENALOG) 0.1 % Apply 1 application topically 2 (two) times daily. 04/19/20   Daleen Squibb, MD    Family History Family History  Problem Relation Age of Onset   Stroke Mother    Diabetes Mother    Hypertension Mother    Lung cancer Mother    Sickle cell trait Mother    Diabetes Maternal Aunt    Lung cancer Maternal Aunt    Lung cancer Maternal Aunt    Lung cancer Maternal Aunt    Colon cancer Cousin 30   Sickle cell trait Father    Sickle cell anemia Sister 32   Lung cancer Maternal Grandmother    Sickle cell anemia Sister    Other Sister        accident and blood clot formed    Social History Social History   Tobacco Use   Smoking status: Former    Types: Cigarettes    Quit date: 10/13/2006    Years since quitting: 14.4   Smokeless tobacco: Never  Vaping Use   Vaping Use: Never used  Substance Use Topics   Alcohol use: Yes    Comment: occasional   Drug use: No     Allergies   Sulfa antibiotics, Sulfamethoxazole-trimethoprim, Codeine, and Latex   Review of Systems Review of Systems Per HPI  Physical Exam Triage Vital Signs ED Triage Vitals  Enc Vitals Group     BP 04/02/21 1121 (!) 150/93     Pulse Rate 04/02/21 1121 81     Resp 04/02/21 1121 16     Temp 04/02/21 1121 98.1 F (36.7 C)     Temp Source 04/02/21 1121 Oral     SpO2 04/02/21 1121  99 %     Weight --      Height --      Head Circumference --      Peak Flow --      Pain Score 04/02/21 1119 0     Pain Loc --      Pain Edu? --      Excl. in [place]? --    No data found.  Updated Vital Signs BP (!) 150/93 (BP Location: Right Arm)   Pulse 81   Temp 98.1 F (36.7 C) (Oral)   Resp 16   SpO2 99%   Visual Acuity Right Eye Distance:   Left Eye Distance:   Bilateral Distance:    Right Eye Near:   Left Eye Near:    Bilateral Near:     Physical Exam Vitals and nursing note reviewed.  Constitutional:      Appearance: Normal appearance. She is not ill-appearing.  HENT:     Head: Atraumatic.     Mouth/Throat:     Mouth: Mucous membranes are moist.  Eyes:     Extraocular Movements: Extraocular movements intact.     Conjunctiva/sclera: Conjunctivae normal.  Cardiovascular:     Rate and Rhythm: Normal rate and regular rhythm.     Heart sounds: Normal heart sounds.  Pulmonary:     Effort: Pulmonary effort is normal.     Breath sounds: Normal breath sounds.  Abdominal:     General: Bowel sounds are normal. There is no distension.     Palpations: Abdomen is soft.     Tenderness: There is no abdominal tenderness. There  is no right CVA tenderness, left CVA tenderness or guarding.  Genitourinary:    Comments: GU exam deferred, self swab performed Musculoskeletal:        General: Normal range of motion.     Cervical back: Normal range of motion and neck supple.  Skin:    General: Skin is warm and dry.  Neurological:     Mental Status: She is alert and oriented to person, place, and time.  Psychiatric:        Mood and Affect: Mood normal.        Thought Content: Thought content normal.        Judgment: Judgment normal.     UC Treatments / Results  Labs (all labs ordered are listed, but only abnormal results are displayed) Labs Reviewed  POCT URINALYSIS DIPSTICK, ED / UC - Abnormal; Notable for the following components:      Result Value   Glucose, UA 500  (*)    Hgb urine dipstick TRACE (*)    All other components within normal limits  URINE CULTURE  CERVICOVAGINAL ANCILLARY ONLY    EKG   Radiology No results found.  Procedures Procedures (including critical care time)  Medications Ordered in UC Medications - No data to display  Initial Impression / Assessment and Plan / UC Course  I have reviewed the triage vital signs and the nursing notes.  Pertinent labs & imaging results that were available during my care of the patient were reviewed by me and considered in my medical decision making (see chart for details).     UA today negative for urinary tract infection, trace RBCs and glucose present.  Reviewed these findings with patient and discussed that it is possible that her elevated blood sugars could be causing some of her urinary symptoms.  Continue working on better blood sugar control, good hydration and will send out for urine culture to ensure no urinary tract infection at this time.  We will give Pyridium for as needed symptomatic benefit.  Vaginal swab also pending for further rule out of urinary symptoms.  Return for acutely worsening symptoms.  Final Clinical Impressions(s) / UC Diagnoses   Final diagnoses:  Dysuria  Glucosuria  Type 2 diabetes mellitus with hyperglycemia, with long-term current use of insulin [facility])   Discharge Instructions   None    ED Prescriptions     Medication Sig Dispense Auth. Provider   phenazopyridine (PYRIDIUM) 200 MG tablet Take 1 tablet (200 mg total) by mouth 3 (three) times daily as needed for pain. 12 tablet Volney American, [place]      PDMP not reviewed this encounter.   Volney American, [place] 04/02/21 1219

## 2021-04-03 ENCOUNTER — Telehealth (HOSPITAL_COMMUNITY): Payer: Self-pay | Admitting: Emergency Medicine

## 2021-04-03 LAB — URINE CULTURE: Culture: NO GROWTH

## 2021-04-03 LAB — CERVICOVAGINAL ANCILLARY ONLY
Bacterial Vaginitis (gardnerella): POSITIVE — AB
Candida Glabrata: NEGATIVE
Candida Vaginitis: POSITIVE — AB
Comment: NEGATIVE
Comment: NEGATIVE
Comment: NEGATIVE

## 2021-04-03 MED ORDER — FLUCONAZOLE 150 MG PO TABS: 150.0000 mg | ORAL_TABLET | Freq: Once | ORAL | 0 refills | Status: AC

## 2021-04-03 MED ORDER — METRONIDAZOLE 500 MG PO TABS: 500.0000 mg | ORAL_TABLET | Freq: Two times a day (BID) | ORAL | 0 refills | Status: DC

## 2021-04-15 ENCOUNTER — Ambulatory Visit
Admission: RE | Admit: 2021-04-15 | Discharge: 2021-04-15 | Disposition: A | Discharge status: Home / Self Care | Payer: Medicare Other | Source: Ambulatory Visit | Attending: Rehabilitation | Admitting: Rehabilitation

## 2021-04-15 ENCOUNTER — Other Ambulatory Visit: Payer: BLUE CROSS/BLUE SHIELD

## 2021-04-15 DIAGNOSIS — I10 Essential (primary) hypertension: Secondary | ICD-10-CM | POA: Diagnosis not present

## 2021-04-15 DIAGNOSIS — Z794 Long term (current) use of insulin: Secondary | ICD-10-CM | POA: Diagnosis not present

## 2021-04-15 DIAGNOSIS — E1169 Type 2 diabetes mellitus with other specified complication: Secondary | ICD-10-CM | POA: Diagnosis not present

## 2021-04-15 DIAGNOSIS — G8929 Other chronic pain: Secondary | ICD-10-CM | POA: Diagnosis not present

## 2021-04-15 DIAGNOSIS — M545 Low back pain, unspecified: Secondary | ICD-10-CM | POA: Diagnosis not present

## 2021-04-15 DIAGNOSIS — M47892 Other spondylosis, cervical region: Secondary | ICD-10-CM

## 2021-04-15 DIAGNOSIS — M797 Fibromyalgia: Secondary | ICD-10-CM | POA: Diagnosis not present

## 2021-04-15 DIAGNOSIS — Z87828 Personal history of other (healed) physical injury and trauma: Secondary | ICD-10-CM | POA: Diagnosis not present

## 2021-04-15 DIAGNOSIS — K51019 Ulcerative (chronic) pancolitis with unspecified complications: Secondary | ICD-10-CM | POA: Diagnosis not present
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
## 2021-04-22 DIAGNOSIS — R519 Headache, unspecified: Secondary | ICD-10-CM | POA: Diagnosis not present

## 2021-04-22 DIAGNOSIS — H832X1 Labyrinthine dysfunction, right ear: Secondary | ICD-10-CM | POA: Diagnosis not present

## 2021-04-22 DIAGNOSIS — R42 Dizziness and giddiness: Secondary | ICD-10-CM | POA: Diagnosis not present

## 2021-05-01 DIAGNOSIS — M5451 Vertebrogenic low back pain: Secondary | ICD-10-CM | POA: Diagnosis not present

## 2021-05-01 DIAGNOSIS — M47892 Other spondylosis, cervical region: Secondary | ICD-10-CM | POA: Diagnosis not present

## 2021-05-20 DIAGNOSIS — I10 Essential (primary) hypertension: Secondary | ICD-10-CM | POA: Diagnosis not present

## 2021-05-20 DIAGNOSIS — E1169 Type 2 diabetes mellitus with other specified complication: Secondary | ICD-10-CM | POA: Diagnosis not present

## 2021-05-30 DIAGNOSIS — M5451 Vertebrogenic low back pain: Secondary | ICD-10-CM | POA: Diagnosis not present

## 2021-05-30 DIAGNOSIS — M47892 Other spondylosis, cervical region: Secondary | ICD-10-CM | POA: Diagnosis not present

## 2021-05-31 ENCOUNTER — Encounter: Payer: Self-pay | Admitting: Internal Medicine

## 2021-05-31 ENCOUNTER — Other Ambulatory Visit: Payer: Self-pay

## 2021-05-31 ENCOUNTER — Ambulatory Visit (INDEPENDENT_AMBULATORY_CARE_PROVIDER_SITE_OTHER): Payer: Medicare Other | Admitting: Internal Medicine

## 2021-05-31 VITALS — BP 124/80 | HR 74 | Ht 62.0 in | Wt 159.0 lb

## 2021-05-31 DIAGNOSIS — Z794 Long term (current) use of insulin: Secondary | ICD-10-CM | POA: Diagnosis not present

## 2021-05-31 DIAGNOSIS — E1165 Type 2 diabetes mellitus with hyperglycemia: Secondary | ICD-10-CM

## 2021-05-31 LAB — GLUCOSE, POCT (MANUAL RESULT ENTRY): POC Glucose: 112 mg/dl — AB (ref 70–99)

## 2021-05-31 MED ORDER — NOVOLOG FLEXPEN 100 UNIT/ML ~~LOC~~ SOPN: PEN_INJECTOR | SUBCUTANEOUS | 11 refills | Status: DC

## 2021-05-31 MED ORDER — INSULIN PEN NEEDLE 32G X 4 MM MISC: 1.0000 | Freq: Four times a day (QID) | 3 refills | Status: DC

## 2021-05-31 MED ORDER — DEXCOM G6 TRANSMITTER MISC: 1.0000 | 3 refills | Status: DC

## 2021-05-31 MED ORDER — BASAGLAR KWIKPEN 100 UNIT/ML ~~LOC~~ SOPN: 30.0000 [IU] | PEN_INJECTOR | Freq: Every day | SUBCUTANEOUS | 4 refills | Status: DC

## 2021-05-31 MED ORDER — DEXCOM G6 SENSOR MISC: 1.0000 | 3 refills | Status: DC
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
## 2021-05-31 NOTE — Patient Instructions (Addendum)
-   Continue Farxiga  10 mg, 1 tablet with breakfast  - Continue  Basaglar 30  units daily  - Take Novolog 10  units with Lunch and 15 with supper  -Novolog correctional insulin: ADD extra units on insulin to your meal-time Novolog dose if your blood sugars are higher than 180. Use the scale below to help guide you:   Blood sugar before meal Number of units to inject  Less than 180 0 unit  181 - 205 2 units  206 -  230 3 units  231 -  255 4 units  256 -  280 5 units  281 -  305 6 units    WHEN YOU TAKE STEROIDS , Increase insulin for 24 hours after the injection  Basaglar 34  Novolog 20 units with Lunch and Supper     HOW TO TREAT LOW BLOOD SUGARS (Blood sugar LESS THAN 70 MG/DL) Please follow the RULE OF 15 for the treatment of hypoglycemia treatment (when your (blood sugars are less than 70 mg/dL)   STEP 1: Take 15 grams of carbohydrates when your blood sugar is low, which includes:  4 GLUCOSE TABS  OR 4 OZ OF JUICE OR REGULAR SODA OR ONE TUBE OF GLUCOSE GEL    STEP 2: RECHECK blood sugar in 15 MINUTES STEP 3: If your blood sugar is still low at the 15 minute recheck --> then, go back to STEP 1 and treat AGAIN with another 15 grams of carbohydrates

## 2021-06-12 DIAGNOSIS — G629 Polyneuropathy, unspecified: Secondary | ICD-10-CM | POA: Diagnosis not present

## 2021-06-24 ENCOUNTER — Encounter (HOSPITAL_COMMUNITY): Payer: Self-pay

## 2021-06-24 ENCOUNTER — Emergency Department (HOSPITAL_COMMUNITY)
Admission: EM | Admit: 2021-06-24 | Discharge: 2021-06-25 | Disposition: A | Discharge status: Home / Self Care | Payer: Medicare Other | Attending: Emergency Medicine | Admitting: Emergency Medicine

## 2021-06-24 ENCOUNTER — Other Ambulatory Visit: Payer: Self-pay

## 2021-06-24 DIAGNOSIS — R109 Unspecified abdominal pain: Secondary | ICD-10-CM | POA: Diagnosis not present

## 2021-06-24 DIAGNOSIS — R079 Chest pain, unspecified: Secondary | ICD-10-CM | POA: Diagnosis not present

## 2021-06-24 DIAGNOSIS — E119 Type 2 diabetes mellitus without complications: Secondary | ICD-10-CM | POA: Diagnosis not present

## 2021-06-24 DIAGNOSIS — Z03818 Encounter for observation for suspected exposure to other biological agents ruled out: Secondary | ICD-10-CM | POA: Diagnosis not present

## 2021-06-24 DIAGNOSIS — Z794 Long term (current) use of insulin: Secondary | ICD-10-CM | POA: Insufficient documentation

## 2021-06-24 DIAGNOSIS — B349 Viral infection, unspecified: Secondary | ICD-10-CM | POA: Diagnosis not present

## 2021-06-24 DIAGNOSIS — Z91040 Latex allergy status: Secondary | ICD-10-CM | POA: Diagnosis not present

## 2021-06-24 DIAGNOSIS — Z7984 Long term (current) use of oral hypoglycemic drugs: Secondary | ICD-10-CM | POA: Diagnosis not present

## 2021-06-24 DIAGNOSIS — Z87891 Personal history of nicotine dependence: Secondary | ICD-10-CM | POA: Diagnosis not present

## 2021-06-24 DIAGNOSIS — M79651 Pain in right thigh: Secondary | ICD-10-CM | POA: Insufficient documentation

## 2021-06-24 DIAGNOSIS — R0782 Intercostal pain: Secondary | ICD-10-CM | POA: Diagnosis not present

## 2021-06-24 DIAGNOSIS — R051 Acute cough: Secondary | ICD-10-CM | POA: Diagnosis not present

## 2021-06-24 DIAGNOSIS — R059 Cough, unspecified: Secondary | ICD-10-CM | POA: Insufficient documentation

## 2021-06-24 DIAGNOSIS — R0789 Other chest pain: Secondary | ICD-10-CM | POA: Insufficient documentation

## 2021-06-24 DIAGNOSIS — R197 Diarrhea, unspecified: Secondary | ICD-10-CM | POA: Diagnosis not present

## 2021-06-24 DIAGNOSIS — I517 Cardiomegaly: Secondary | ICD-10-CM | POA: Diagnosis not present

## 2021-06-24 NOTE — ED Triage Notes (Signed)
Patient went to [facility] physicians today and was sent to the ED to r/o a PE.Marland Kitchen Patient c/o a non productive cough x 2 weeks. Patient states when she coughs she has burning in her chest, rib cage pain, and bilateral flank pain.

## 2021-06-24 NOTE — ED Provider Notes (Signed)
Emergency Medicine Provider Triage Evaluation Note  Beverly Ramirez , a 60 y.o. female  was evaluated in triage.  Pt was sent by her primary care provider for a pulmonary embolus rule out.  Has had a cough, noted pain in her right leg and had a high D-dimer at their office.  PCP out of system  Review of Systems  Positive: Cough, right leg pain Negative: Chest pain  Physical Exam  BP (!) 163/112 (BP Location: Right Arm)   Pulse 94   Temp 98.3 F (36.8 C) (Oral)   Resp 16   Ht 5' 2"  (1.575 m)   Wt 71.2 kg   SpO2 94%   BMI 28.72 kg/m  Gen:   Awake, no distress  Resp:  Normal effort  MSK:   Moves extremities without difficulty  Other:    Medical Decision Making  Medically screening exam initiated at 6:38 PM.  Appropriate orders placed.  Krystal Loughridge-Hines was informed that the remainder of the evaluation will be completed by another provider, this initial triage assessment does not replace that evaluation, and the importance of remaining in the ED until their evaluation is complete.     Darliss Ridgel 06/24/21 1840    Sherwood Gambler, MD 06/26/21 1452

## 2021-06-25 ENCOUNTER — Encounter (HOSPITAL_COMMUNITY): Payer: Self-pay

## 2021-06-25 ENCOUNTER — Emergency Department (HOSPITAL_COMMUNITY): Payer: Medicare Other

## 2021-06-25 DIAGNOSIS — I517 Cardiomegaly: Secondary | ICD-10-CM | POA: Diagnosis not present

## 2021-06-25 DIAGNOSIS — R0789 Other chest pain: Secondary | ICD-10-CM | POA: Diagnosis not present

## 2021-06-25 DIAGNOSIS — R079 Chest pain, unspecified: Secondary | ICD-10-CM | POA: Diagnosis not present

## 2021-06-25 DIAGNOSIS — R059 Cough, unspecified: Secondary | ICD-10-CM | POA: Diagnosis not present

## 2021-06-25 LAB — CBC WITH DIFFERENTIAL/PLATELET
Abs Immature Granulocytes: 0.04 10*3/uL (ref 0.00–0.07)
Basophils Absolute: 0 10*3/uL (ref 0.0–0.1)
Basophils Relative: 0 %
Eosinophils Absolute: 0.8 10*3/uL — ABNORMAL HIGH (ref 0.0–0.5)
Eosinophils Relative: 7 %
HCT: 39.7 % (ref 36.0–46.0)
Hemoglobin: 13.4 g/dL (ref 12.0–15.0)
Immature Granulocytes: 0 %
Lymphocytes Relative: 29 %
Lymphs Abs: 3.3 10*3/uL (ref 0.7–4.0)
MCH: 28.9 pg (ref 26.0–34.0)
MCHC: 33.8 g/dL (ref 30.0–36.0)
MCV: 85.7 fL (ref 80.0–100.0)
Monocytes Absolute: 1 10*3/uL (ref 0.1–1.0)
Monocytes Relative: 8 %
Neutro Abs: 6.3 10*3/uL (ref 1.7–7.7)
Neutrophils Relative %: 56 %
Platelets: 367 10*3/uL (ref 150–400)
RBC: 4.63 MIL/uL (ref 3.87–5.11)
RDW: 12.3 % (ref 11.5–15.5)
WBC: 11.5 10*3/uL — ABNORMAL HIGH (ref 4.0–10.5)
nRBC: 0 % (ref 0.0–0.2)

## 2021-06-25 LAB — BASIC METABOLIC PANEL
Anion gap: 9 (ref 5–15)
BUN: 10 mg/dL (ref 6–20)
CO2: 27 mmol/L (ref 22–32)
Calcium: 9.1 mg/dL (ref 8.9–10.3)
Chloride: 100 mmol/L (ref 98–111)
Creatinine, Ser: 0.79 mg/dL (ref 0.44–1.00)
GFR, Estimated: >60 mL/min (ref >60)
Glucose, Bld: 178 mg/dL — ABNORMAL HIGH (ref 70–99)
Potassium: 3.3 mmol/L — ABNORMAL LOW (ref 3.5–5.1)
Sodium: 136 mmol/L (ref 135–145)

## 2021-06-25 LAB — TROPONIN I (HIGH SENSITIVITY): Troponin I (High Sensitivity): 5 ng/L (ref <18)

## 2021-06-25 IMAGING — CT CT ANGIO CHEST
2 of 6 series · 18 of 36 positions shown · IV contrast (OMNIPAQUE 350)
Comparison: CT chest dated [DATE]

CLINICAL DATA: Cough x2 weeks, rib pain

EXAM:
CT ANGIOGRAPHY CHEST WITH CONTRAST
TECHNIQUE: Multidetector CT imaging of the chest was performed using the
standard protocol during bolus administration of intravenous
contrast. Multiplanar CT image reconstructions and MIPs were
obtained to evaluate the vascular anatomy.
CONTRAST:  80mL OMNIPAQUE IOHEXOL 350 MG/ML SOLN

[Series 5: thins · axial · 0.62mm/px · z∈[-56,+148]mm · 17 of 230 slices shown]
[im 13/230  lung]
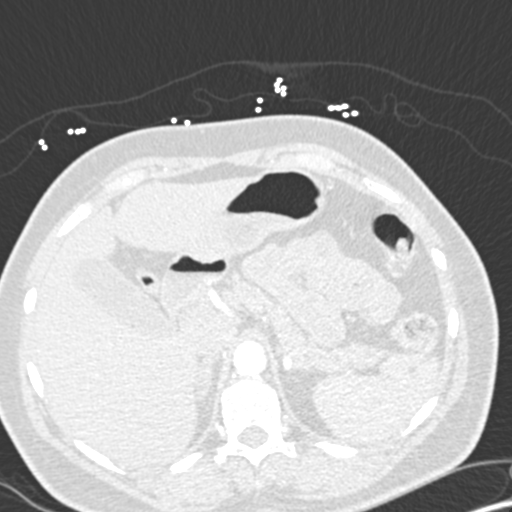
[im 26/230  mediastinal]
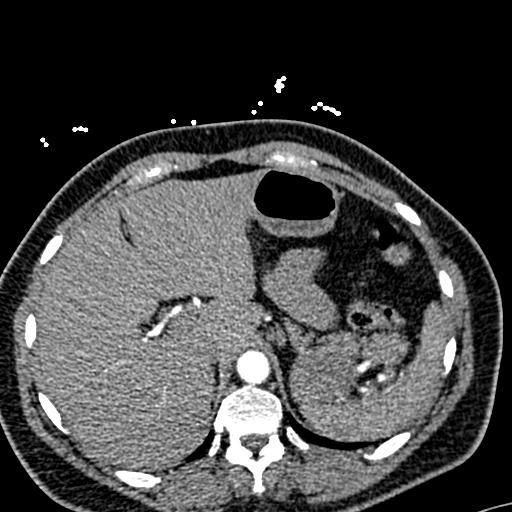
[im 39/230  lung]
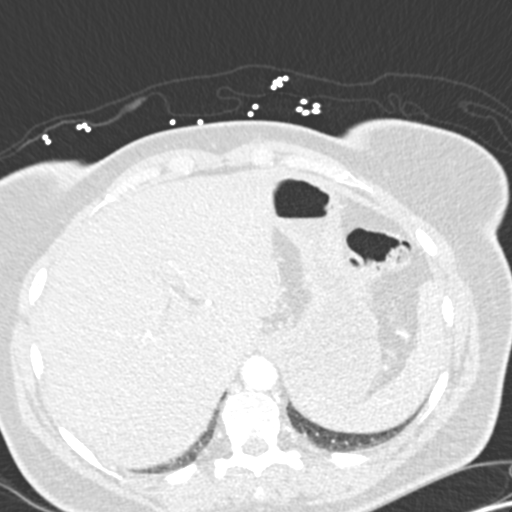
[im 51/230  mediastinal]
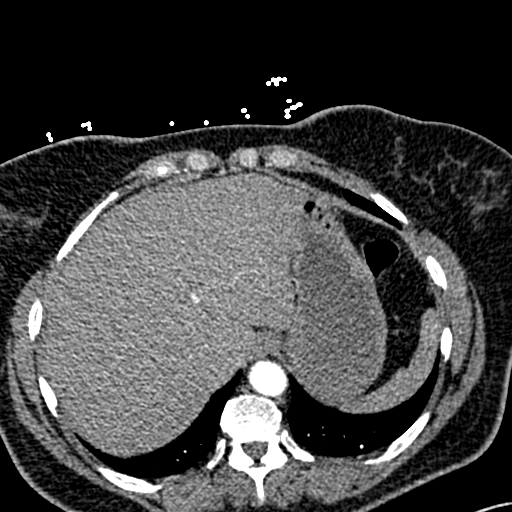
[im 64/230  lung]
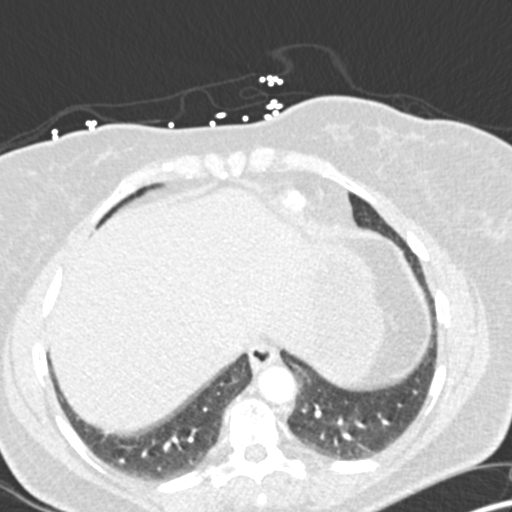
[im 77/230  mediastinal]
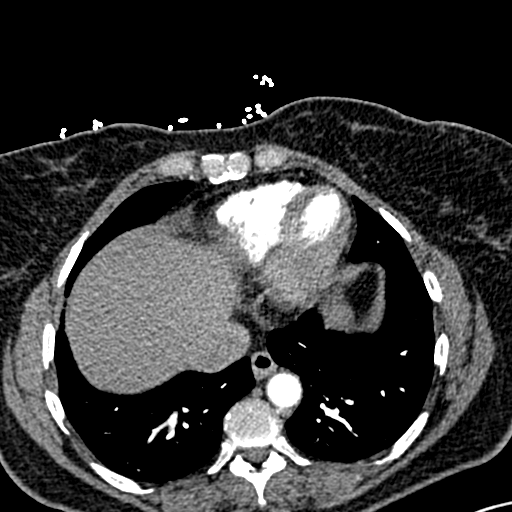
[im 90/230  lung]
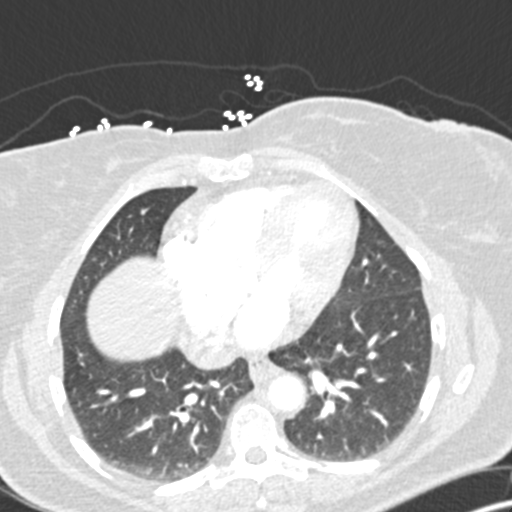
[im 102/230  mediastinal]
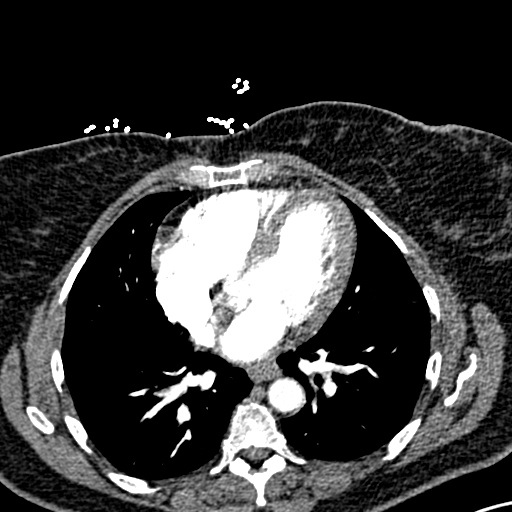
[im 115/230  lung]
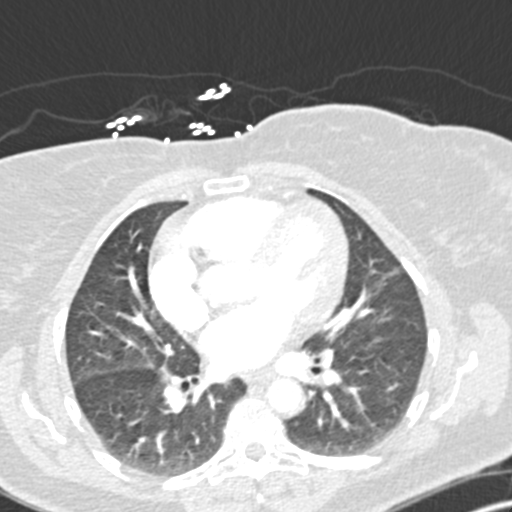
[im 128/230  mediastinal]
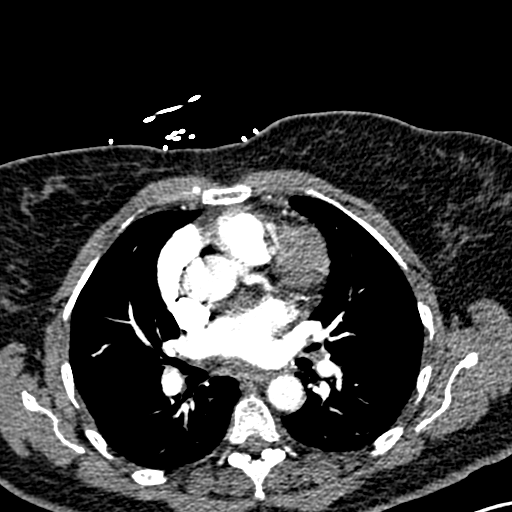
[im 140/230  lung]
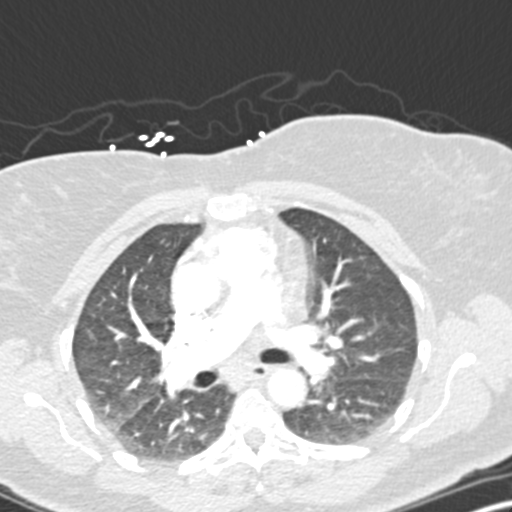
[im 153/230  mediastinal]
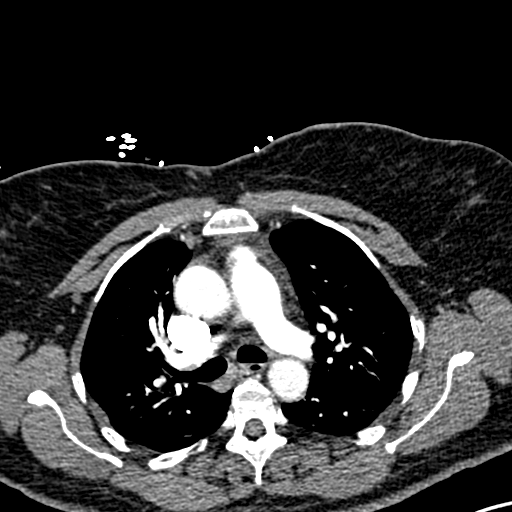
[im 166/230  lung]
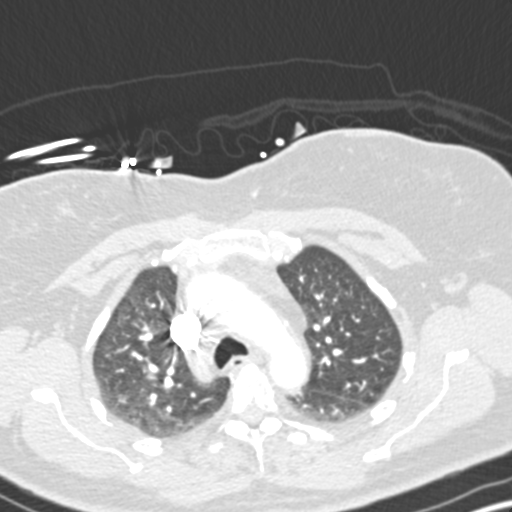
[im 179/230  mediastinal]
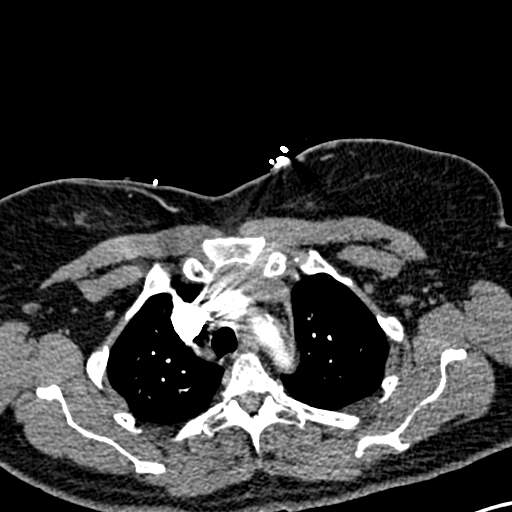
[im 191/230  lung]
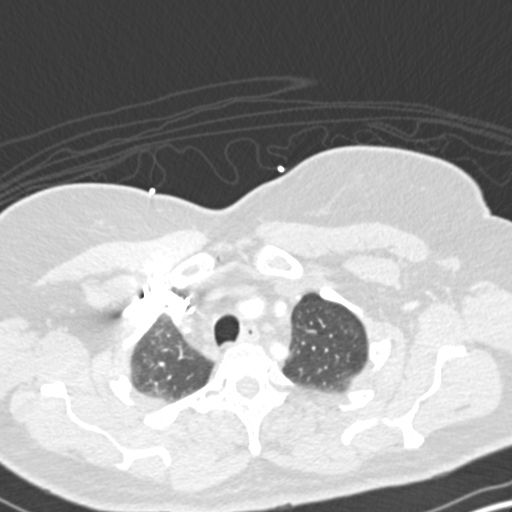
[im 204/230  mediastinal]
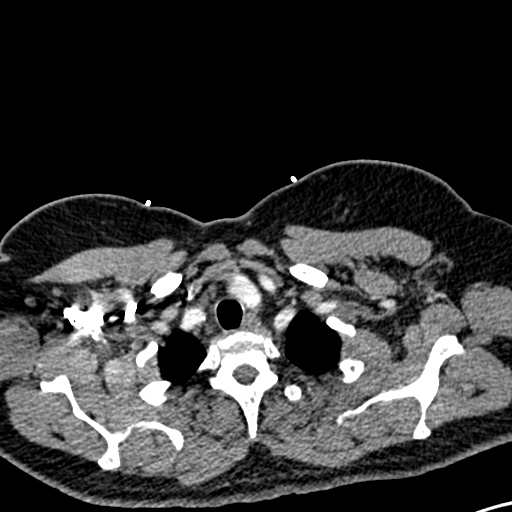
[im 217/230  lung]
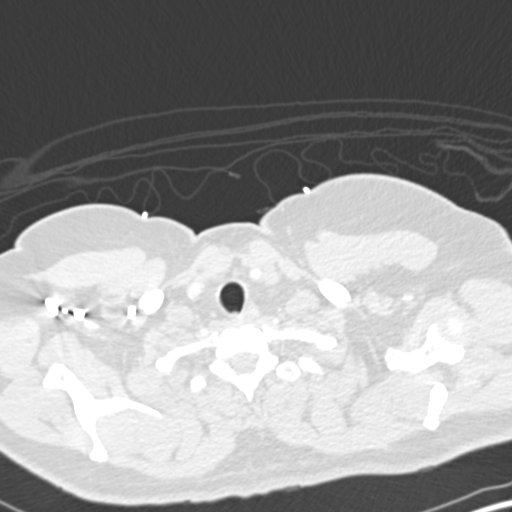

[Series 7: coronal mpr · coronal · 0.47mm/px · 1 of 121 slices shown]
[im 61/121  mediastinal]
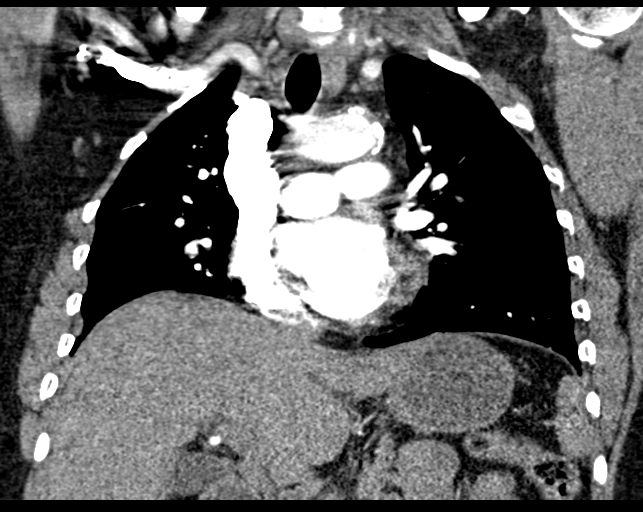

[18 of 36 positions shown; findings below may reference images not displayed]

FINDINGS: Cardiovascular: Satisfactory opacification the bilateral pulmonary
arteries to the segmental level. No evidence of pulmonary embolism.

Although not tailored for evaluation of the thoracic aorta, there is
no evidence of thoracic aortic aneurysm or dissection.
Atherosclerotic calcifications of the aortic arch.

Cardiomegaly.  No pericardial effusion.

Mediastinum/Nodes: No suspicious mediastinal lymphadenopathy.

Visualized thyroid is unremarkable.

Lungs/Pleura: Mild ground-glass opacity/mosaic attenuation in the
posterior right upper lobe.

Mild linear scarring/atelectasis in the lingula.

No focal consolidation.

No suspicious pulmonary nodules.

No pleural effusion or pneumothorax.

Upper Abdomen: Visualized upper abdomen is grossly unremarkable.

Musculoskeletal: Visualized osseous structures are within normal
limits. No rib fracture is seen.

Review of the MIP images confirms the above findings.
IMPRESSION: No evidence of pulmonary embolism.

No evidence of acute cardiopulmonary disease.

Aortic Atherosclerosis ([48]-[48]).

## 2021-06-25 MED ORDER — AEROCHAMBER PLUS FLO-VU MISC
1.0000 | Freq: Once | Status: AC
  Administered 2021-06-25: 1
  Filled 2021-06-25: qty 1

## 2021-06-25 MED ORDER — FENTANYL CITRATE PF 50 MCG/ML IJ SOSY
100.0000 ug | PREFILLED_SYRINGE | Freq: Once | INTRAMUSCULAR | Status: AC
  Administered 2021-06-25: 100 ug via INTRAVENOUS
  Filled 2021-06-25: qty 2

## 2021-06-25 MED ORDER — IOHEXOL 350 MG/ML SOLN
80.0000 mL | Freq: Once | INTRAVENOUS | Status: AC | PRN
  Administered 2021-06-25: 80 mL via INTRAVENOUS

## 2021-06-25 MED ORDER — OXYCODONE-ACETAMINOPHEN 5-325 MG PO TABS
1.0000 | ORAL_TABLET | Freq: Four times a day (QID) | ORAL | 0 refills | Status: DC | PRN
Start: 2021-06-25 — End: 2021-07-16

## 2021-06-25 MED ORDER — ALBUTEROL SULFATE HFA 108 (90 BASE) MCG/ACT IN AERS
2.0000 | INHALATION_SPRAY | RESPIRATORY_TRACT | Status: DC | PRN
  Administered 2021-06-25: 2 via RESPIRATORY_TRACT
  Filled 2021-06-25: qty 6.7

## 2021-06-25 MED ORDER — ONDANSETRON HCL 4 MG/2ML IJ SOLN
4.0000 mg | Freq: Once | INTRAMUSCULAR | Status: AC
  Administered 2021-06-25: 4 mg via INTRAVENOUS
  Filled 2021-06-25: qty 2

## 2021-06-25 MED ORDER — DIPHENHYDRAMINE HCL 50 MG/ML IJ SOLN
25.0000 mg | Freq: Once | INTRAMUSCULAR | Status: AC
  Administered 2021-06-25: 25 mg via INTRAVENOUS
  Filled 2021-06-25: qty 1

## 2021-06-25 NOTE — ED Provider Notes (Signed)
[facility] DEPT Provider Note: Georgena Spurling, MD, FACEP  CSN: [id number] MRN: [id number] ARRIVAL: 06/24/21 at [place]  Chest Pain   HISTORY OF PRESENT ILLNESS  06/25/21 1:15 AM Beverly Ramirez is a 60 y.o. female who has had a nonproductive cough for 2 weeks.  When she coughs she has a burning in her chest as well as bilateral flanks.  She has also had pain in her right thigh and had a high D-dimer at the [place] office.  She was seen at [facility] yesterday and was sent here to be evaluated for a pulmonary embolism.  She rates pain in her chest and flanks as a 9 out of 10, burning in nature.  It is worse with cough.   The patient tells me her pain is now primarily on the left side of her chest and feels like a tightness or gripping pain.  It is worse with deep breathing or coughing.  She states it has been intermittent for the past several days.   Past Medical History:  Diagnosis Date   Anemia    Carpal tunnel syndrome    bilateral   Chronic heel pain    Diabetes mellitus without complication (HCC)    DJD (degenerative joint disease) of cervical spine    MRI 2017   Hyperlipidemia    Plantar fasciitis    bilateral   Sickle cell anemia (HCC)    sickle cell trait   Sleep apnea    Tendonitis    left hand/wrist   UC (ulcerative colitis) ([place])     Past Surgical History:  Procedure Laterality Date   ABDOMINAL HYSTERECTOMY     APPENDECTOMY     CESAREAN SECTION     x4   COLONOSCOPY     Multiple in [place]   ESOPHAGOGASTRODUODENOSCOPY      Family History  Problem Relation Age of Onset   Stroke Mother    Diabetes Mother    Hypertension Mother    Lung cancer Mother    Sickle cell trait Mother    Diabetes Maternal Aunt    Lung cancer Maternal Aunt    Lung cancer Maternal Aunt    Lung cancer Maternal Aunt    Colon cancer Cousin 30   Sickle cell trait Father    Sickle cell anemia Sister 32   Lung cancer Maternal  Grandmother    Sickle cell anemia Sister    Other Sister        accident and blood clot formed    Social History   Tobacco Use   Smoking status: Former    Types: Cigarettes    Quit date: 10/13/2006    Years since quitting: 14.7   Smokeless tobacco: Never  Vaping Use   Vaping Use: Never used  Substance Use Topics   Alcohol use: Yes    Comment: occasional   Drug use: No    Prior to Admission medications   Medication Sig Start Date End Date Taking? Authorizing Provider  oxyCODONE-acetaminophen (PERCOCET) 5-325 MG tablet Take 1 tablet by mouth every 6 (six) hours as needed (for chest pain). 06/25/21  Yes Trung Wenzl, MD  albuterol (VENTOLIN HFA) 108 (90 Base) MCG/ACT inhaler Inhale 2 puffs into the lungs every 6 (six) hours as needed for wheezing or shortness of breath. 03/13/20   Jacelyn Pi, Lilia Argue, MD  amLODipine (NORVASC) 5 MG tablet TAKE 1 TABLET BY MOUTH EVERY DAY 06/08/20   Jacelyn Pi, Lilia Argue, MD  atorvastatin (LIPITOR) 40 MG tablet Take 1 tablet (40 mg total) by mouth daily. 02/10/20   Daleen Squibb, MD  Continuous Blood Gluc Sensor (DEXCOM G6 SENSOR) MISC 1 Device by Does not apply route as directed. 05/31/21   Shamleffer, Melanie Crazier, MD  Continuous Blood Gluc Transmit (DEXCOM G6 TRANSMITTER) MISC 1 Device by Does not apply route as directed. 05/31/21   Shamleffer, Melanie Crazier, MD  dapagliflozin propanediol (FARXIGA) 10 MG TABS tablet Take 1 tablet (10 mg total) by mouth daily. 10/26/20   Shamleffer, Melanie Crazier, MD  dicyclomine (BENTYL) 20 MG tablet Take 1 tablet (20 mg total) by mouth 3 (three) times daily as needed for spasms (abdominal cramping). 10/18/20   Wendie Agreste, MD  diphenoxylate-atropine (LOMOTIL) 2.5-0.025 MG tablet Take 1 tablet by mouth 4 (four) times daily as needed for diarrhea or loose stools. 04/27/20   Gatha Mayer, MD  DULoxetine (CYMBALTA) 60 MG capsule TAKE 1 CAPSULE BY MOUTH EVERY DAY 03/20/20   Jacelyn Pi, Lilia Argue, MD   esomeprazole (NEXIUM) 40 MG capsule TAKE 1 CAPSULE BY MOUTH EVERY DAY BEFORE BREAKFAST 07/20/20   Gatha Mayer, MD  fluticasone [facility]) 50 MCG/ACT nasal spray SPRAY 2 SPRAYS INTO EACH NOSTRIL EVERY DAY Patient taking differently: Patient takes as needed 03/08/20   Jacelyn Pi, Lilia Argue, MD  glucose blood (ONETOUCH VERIO) test strip Check blood glucose 3-4 x day. Dx E11.9, E11.649, z79.4, 11/06/20   Maximiano Coss, NP  ibuprofen (ADVIL) 800 MG tablet Take 1 tablet (800 mg total) by mouth every 8 (eight) hours as needed for moderate pain. 02/11/21   Long, Wonda Olds, MD  insulin aspart (NOVOLOG FLEXPEN) 100 UNIT/ML FlexPen Max Daily 50 units 05/31/21   Shamleffer, Melanie Crazier, MD  Insulin Glargine (BASAGLAR KWIKPEN) 100 UNIT/ML Inject 30 Units into the skin at bedtime. 05/31/21   Shamleffer, Melanie Crazier, MD  Insulin Pen Needle 32G X 4 MM MISC 1 Device by Does not apply route in the morning, at noon, in the evening, and at bedtime. 05/31/21   Shamleffer, Melanie Crazier, MD  Lancets [facility] DELICA PLUS [id number]) MISC 1 each by Does not apply route 3 (three) times daily. Dx E11.65, z79.4 01/24/19   Jacelyn Pi, Lilia Argue, MD  lidocaine (LIDODERM) 5 % Place 1 patch onto the skin daily. Remove & Discard patch within 12 hours or as directed by MD 02/12/21   Loni Beckwith, PA-C  lidocaine (XYLOCAINE) 2 % solution Use as directed 15 mLs in the mouth or throat as needed for mouth pain. 03/09/20   Gatha Mayer, MD  mesalamine (LIALDA) 1.2 g EC tablet Take 2 tablets (2.4 g total) by mouth 2 (two) times daily. 04/28/20 04/23/21  Gatha Mayer, MD  methocarbamol (ROBAXIN) 500 MG tablet Take 1 tablet (500 mg total) by mouth every 8 (eight) hours as needed for muscle spasms. 02/11/21   Long, Wonda Olds, MD  metroNIDAZOLE (FLAGYL) 500 MG tablet Take 1 tablet (500 mg total) by mouth 2 (two) times daily. 04/03/21   Lamptey, Myrene Galas, MD  montelukast (SINGULAIR) 10 MG tablet TAKE 1 TABLET BY MOUTH EVERYDAY AT  BEDTIME 11/27/20   Just, Laurita Quint, FNP  Multiple Vitamins-Minerals (WOMENS 50+ MULTI VITAMIN/MIN PO) Take by mouth.    [provider]  nitrofurantoin, macrocrystal-monohydrate, (MACROBID) 100 MG capsule Take 1 capsule (100 mg total) by mouth 2 (two) times daily. 03/22/21   Hazel Sams, PA-C  NONFORMULARY OR COMPOUNDED ITEM Swish and spit  5 mLs as needed (ulcer flare up in mouth). Magic mouthwash with lidocaine disp#150 ml 05/04/20   Gatha Mayer, MD  ondansetron (ZOFRAN ODT) 4 MG disintegrating tablet Take 1 tablet (4 mg total) by mouth every 8 (eight) hours as needed for nausea or vomiting. 05/08/20   Long, Wonda Olds, MD  phenazopyridine (PYRIDIUM) 200 MG tablet Take 1 tablet (200 mg total) by mouth 3 (three) times daily as needed for pain. 04/02/21   Volney American, PA-C  senna-docusate (SENOKOT-S) 8.6-50 MG tablet Take 1 tablet by mouth at bedtime as needed for mild constipation or moderate constipation. 02/11/21   Long, Wonda Olds, MD  triamcinolone cream (KENALOG) 0.1 % Apply 1 application topically 2 (two) times daily. 04/19/20   Daleen Squibb, MD    Allergies Sulfa antibiotics, Sulfamethoxazole-trimethoprim, Codeine, and Latex   REVIEW OF SYSTEMS  Negative except as noted here or in the History of Present Illness.   PHYSICAL EXAMINATION  Initial Vital Signs Blood pressure (!) 197/104, pulse 76, temperature 98.3 F (36.8 C), temperature source Oral, resp. rate 16, height 5' 2"  (1.575 m), weight 71.2 kg, SpO2 100 %.  Examination General: Well-developed, well-nourished female in no acute distress; appearance consistent with age of record HENT: normocephalic; atraumatic Eyes: pupils equal, round and reactive to light; extraocular muscles intact Neck: supple Heart: regular rate and rhythm Lungs: clear to auscultation bilaterally Chest: Nontender Abdomen: soft; nondistended; nontender; bowel sounds present Extremities: No deformity; full range of motion; pulses  normal; no edema; diffuse tenderness of right thigh Neurologic: Awake, alert and oriented; motor function intact in all extremities and symmetric; no facial droop Skin: Warm and dry Psychiatric: Normal mood and affect   RESULTS  Summary of this visit's results, reviewed and interpreted by myself:   EKG Interpretation  Date/Time:  Tuesday June 25 2021 01:27:31 EDT Ventricular Rate:  78 PR Interval:  158 QRS Duration: 76 QT Interval:  387 QTC Calculation: 441 R Axis:   -1 Text Interpretation: Sinus rhythm Abnormal R-wave progression, early transition Borderline T wave abnormalities No significant change was found Confirmed by Shanon Rosser [phone]) on 06/25/2021 1:35:10 AM       Laboratory Studies: Results for orders placed or performed during the hospital encounter of 06/24/21 (from the past 24 hour(s))  Basic metabolic panel     Status: Abnormal   Collection Time: 06/25/21  2:16 AM  Result Value Ref Range   Sodium 136 135 - 145 mmol/L   Potassium 3.3 (L) 3.5 - 5.1 mmol/L   Chloride 100 98 - 111 mmol/L   CO2 27 22 - 32 mmol/L   Glucose, Bld 178 (H) 70 - 99 mg/dL   BUN 10 6 - 20 mg/dL   Creatinine, Ser 0.79 0.44 - 1.00 mg/dL   Calcium 9.1 8.9 - 10.3 mg/dL   GFR, Estimated >60 >60 mL/min   Anion gap 9 5 - 15  CBC with Differential/Platelet     Status: Abnormal   Collection Time: 06/25/21  2:16 AM  Result Value Ref Range   WBC 11.5 (H) 4.0 - 10.5 K/uL   RBC 4.63 3.87 - 5.11 MIL/uL   Hemoglobin 13.4 12.0 - 15.0 g/dL   HCT 39.7 36.0 - 46.0 %   MCV 85.7 80.0 - 100.0 fL   MCH 28.9 26.0 - 34.0 pg   MCHC 33.8 30.0 - 36.0 g/dL   RDW 12.3 11.5 - 15.5 %   Platelets 367 150 - 400 K/uL   nRBC 0.0 0.0 -  0.2 %   Neutrophils Relative % 56 %   Neutro Abs 6.3 1.7 - 7.7 K/uL   Lymphocytes Relative 29 %   Lymphs Abs 3.3 0.7 - 4.0 K/uL   Monocytes Relative 8 %   Monocytes Absolute 1.0 0.1 - 1.0 K/uL   Eosinophils Relative 7 %   Eosinophils Absolute 0.8 (H) 0.0 - 0.5 K/uL    Basophils Relative 0 %   Basophils Absolute 0.0 0.0 - 0.1 K/uL   Immature Granulocytes 0 %   Abs Immature Granulocytes 0.04 0.00 - 0.07 K/uL  Troponin I (High Sensitivity)     Status: None   Collection Time: 06/25/21  2:16 AM  Result Value Ref Range   Troponin I (High Sensitivity) 5 <18 ng/L   Imaging Studies: CT Angio Chest Pulmonary Embolism (PE) W or WO Contrast  Result Date: 06/25/2021 CLINICAL DATA:  Cough x2 weeks, rib pain EXAM: CT ANGIOGRAPHY CHEST WITH CONTRAST TECHNIQUE: Multidetector CT imaging of the chest was performed using the standard protocol during bolus administration of intravenous contrast. Multiplanar CT image reconstructions and MIPs were obtained to evaluate the vascular anatomy. CONTRAST:  58m OMNIPAQUE IOHEXOL 350 MG/ML SOLN COMPARISON:  CT chest dated 02/11/2021 FINDINGS: Cardiovascular: Satisfactory opacification the bilateral pulmonary arteries to the segmental level. No evidence of pulmonary embolism. Although not tailored for evaluation of the thoracic aorta, there is no evidence of thoracic aortic aneurysm or dissection. Atherosclerotic calcifications of the aortic arch. Cardiomegaly.  No pericardial effusion. Mediastinum/Nodes: No suspicious mediastinal lymphadenopathy. Visualized thyroid is unremarkable. Lungs/Pleura: Mild ground-glass opacity/mosaic attenuation in the posterior right upper lobe. Mild linear scarring/atelectasis in the lingula. No focal consolidation. No suspicious pulmonary nodules. No pleural effusion or pneumothorax. Upper Abdomen: Visualized upper abdomen is grossly unremarkable. Musculoskeletal: Visualized osseous structures are within normal limits. No rib fracture is seen. Review of the MIP images confirms the above findings. IMPRESSION: No evidence of pulmonary embolism. No evidence of acute cardiopulmonary disease. Aortic Atherosclerosis (ICD10-I70.0). Electronically Signed   By: SJulian HyM.D.   On: 06/25/2021 03:54    ED COURSE and  MDM  Nursing notes, initial and subsequent vitals signs, including pulse oximetry, reviewed and interpreted by myself.  Vitals:   06/24/21 1827 06/25/21 0105 06/25/21 0210 06/25/21 0300  BP: (!) 163/112 (!) 197/104 (!) 170/100 (!) 172/96  Pulse: 94 76 81 83  Resp: 16 16 (!) 28 (!) 24  Temp: 98.3 F (36.8 C)     TempSrc: Oral     SpO2: 94% 100% 98% 100%  Weight: 71.2 kg     Height: 5' 2"  (1.575 m)      Medications  albuterol (VENTOLIN HFA) 108 (90 Base) MCG/ACT inhaler 2 puff (has no administration in time range)  aerochamber plus with mask device 1 each (has no administration in time range)  ondansetron (ZOFRAN) injection 4 mg (4 mg Intravenous Given 06/25/21 0147)  fentaNYL (SUBLIMAZE) injection 100 mcg (100 mcg Intravenous Given 06/25/21 0147)  diphenhydrAMINE (BENADRYL) injection 25 mg (25 mg Intravenous Given 06/25/21 0300)  iohexol (OMNIPAQUE) 350 MG/ML injection 80 mL (80 mLs Intravenous Contrast Given 06/25/21 0311)   The cause of the patient's chest pain is unclear but it is pleuritic in nature.  Her EKG and troponin are not consistent with cardiac ischemia.  There is no external tenderness to suggest shingles.   PROCEDURES  Procedures   ED DIAGNOSES     ICD-10-CM   1. Atypical chest pain  R07.89     2. Cough in adult  R05.9          Breely Panik, MD 06/25/21 0400

## 2021-06-26 ENCOUNTER — Telehealth: Payer: Self-pay | Admitting: Internal Medicine

## 2021-06-26 NOTE — Telephone Encounter (Signed)
Patient having flare-up of colitis, vomiting, watery diarrhea.  Needs to be seen sooner rather than later.  Can you please call and advise.  Thank you.

## 2021-06-27 NOTE — Telephone Encounter (Signed)
Pt states that she is having a Colitis Flare. Pt states that she is having nausea/ vomiting, Diarrhea, along with stomach cramps: Pt states that she has had these symptoms for 8 days. Pt states that she went to the ED on Monday and they told her it was a stomach virus. Pt states she believes that she is having a Colitis Flare: Please Advise

## 2021-06-27 NOTE — Telephone Encounter (Signed)
Pt given Dr. Carlean Purl recommendations. OV scheduled for pt with Tye Savoy NP on 07/04/2021 @ 1:30  Pt made aware. Pt verbalized understanding with all questions answered.

## 2021-06-27 NOTE — Telephone Encounter (Signed)
Chart review - 10/17 ED visit was about cough and chest pain no mention of GI sxs  That said she should stay on clear liquids, use her ondansetron and Lomotil  Schedule next available with APP vs me whichever first  If she is unable to maintain adequate liquids or feels worse will need to return to ED

## 2021-07-03 ENCOUNTER — Ambulatory Visit: Payer: Medicare Other | Admitting: Nurse Practitioner

## 2021-07-04 ENCOUNTER — Ambulatory Visit: Payer: Medicare Other | Admitting: Nurse Practitioner

## 2021-07-15 DIAGNOSIS — M47892 Other spondylosis, cervical region: Secondary | ICD-10-CM | POA: Diagnosis not present

## 2021-07-15 DIAGNOSIS — M5451 Vertebrogenic low back pain: Secondary | ICD-10-CM | POA: Diagnosis not present

## 2021-07-16 ENCOUNTER — Other Ambulatory Visit: Payer: Medicare Other

## 2021-07-16 ENCOUNTER — Ambulatory Visit (INDEPENDENT_AMBULATORY_CARE_PROVIDER_SITE_OTHER): Payer: Medicare Other | Admitting: Nurse Practitioner

## 2021-07-16 ENCOUNTER — Encounter: Payer: Self-pay | Admitting: Nurse Practitioner

## 2021-07-16 VITALS — BP 140/78 | HR 70 | Ht 62.0 in | Wt 158.2 lb

## 2021-07-16 DIAGNOSIS — R197 Diarrhea, unspecified: Secondary | ICD-10-CM

## 2021-07-16 DIAGNOSIS — R112 Nausea with vomiting, unspecified: Secondary | ICD-10-CM | POA: Diagnosis not present

## 2021-07-16 DIAGNOSIS — K51819 Other ulcerative colitis with unspecified complications: Secondary | ICD-10-CM | POA: Diagnosis not present

## 2021-07-16 MED ORDER — PROCHLORPERAZINE 25 MG RE SUPP: 25.0000 mg | Freq: Two times a day (BID) | RECTAL | 2 refills | Status: DC | PRN

## 2021-07-16 NOTE — Progress Notes (Signed)
ASSESSMENT AND PLAN    #60 year old female with longstanding ulcerative colitis (diagnosed in 2007).  Previously treated with Entyvio and Humira but currently maintained on monotherapy with Lialda 4.8 g daily.  She was relatively well at time of her last follow-up here in October 2021.  Now with several week history of nausea, vomiting, abdominal cramps and diarrhea.  Rule out UC flare.  Superimposed infectious process such as C. Difficile -- WBC 11.5, hemoglobin 13.4  at recent ED visit for chest pain ( negative evaluation) --fecal calprotectin, C-diff --If C-diff negative will most likely proceed with colonoscopy to evaluate UC flare symptoms.  She is due for surveillance colonoscopy in December 2022. Cannot scheduled until C-diff ruled out  --She can take Bentyl as needed for abdominal cramps.  She has the medication at home but was not taking it.  She thought it was a muscle relaxer for her back  # Nausea / vomiting. Part of UC flare?  --Zofran doesn't help her. Trial of compazine 25 mg BID prn #30 --Will schedule for EGD to be done at time of colonoscopy   HISTORY OF PRESENT ILLNESS    Chief Complaint : Nausea, vomiting, diarrhea, abdominal pain  Beverly Ramirez is a 60 y.o. female known to Dr. Carlean Purl with a past medical history  of pan ulcerative colitis, fibromyalgia, diabetes ,HLD, hysterectomy, appendectomy.  Additional medical history as listed in [place] .   Patient has pan ulcerative colitis di, agnosed in [place] in 2007.  Previously treated with Entyvio but it was stopped due to nausea and ? Abnormal LFTs.  She then went on Humira and did well but after moving back to [facility] there was an insurance change and she could not afford it.  Dr. Carlean Purl put her on mesalamine 2.4 g twice daily.   October 2021 last office visit -shehad problems with mouth ulcers, we referred her to ENT.  Fecal calprotectin checked and had improved from 743 to 137.  Continue on Lialda  ED on  06/25/2021 for evaluation of chest pain, rule out PE.  WBC 11.5, hgb 13.4, Troponin 5.  EKG 10-18 2022 01:27:31 EDT Ventricular Rate:   78 PR Interval:                      158 QRS Duration:        76 QT Interval:                      387 QTC Calculation:    441 R Axis:                           -1 Text Interpretation: Sinus rhythm Abnormal R-wave progression, early transition Borderline T wave abnormalities No significant change was found Confirmed by Shanon Rosser [phone]) on 06/25/2021 1:35:10 AM  CTA chest -no evidence for PE.  **Cause of chest pain unclear but felt to be pleuritic.  EKG and troponin not consistent with cardiac ischemia   06/27/21 patient called the office with colitis flare symptoms.  She has been having nausea, vomiting, non-bloody diarrhea and generalized abdominal cramping over the last several weeks. She may have anywhere from two to several BMs a day. Stool consistency varies between watery and soft. She has nocturnal stooling (gets up about twice at night).  Lomotil is on her home med list but she has not been taking it.  She thought it was constipation . Dicyclomine  on home med list but she is not taking it because she thought it was a muscle relaxer for her back.  The vomiting is often postprandial but also occurs randomly.  If no food in her stomach then she vomits yellow fluid . She has taken Zofran before but doesn't feel like it helps.  She has oxycodone on med list ( given in ED) but isn't taking it due to itching.  She put herself on a soft diet which helped all of her symptoms but only a couple of weeks.  She hasn't started any new medications to attribute symptoms to.  As far as she can remember she has not had any antibiotics in the last few months except for Macrobid for UTI . Metronidazole on home med list but she does not recall what it is or if she ever took it . no fevers. No urinary symptoms. She doesn't take NSAIDs.     PREVIOUS ENDOSCOPIC EVALUATIONS /  PERTINENT STUDIES:   December 2020 colonoscopy for disease activity assessment chronic ulcerative pancolitis' Diverticulosis in the sigmoid colon. - The examined portion of the ileum was normal. Biopsied. - The examination was otherwise normal on direct and retroflexion views. No endoscopic abnormalities in this patient with ulcerative colitis on mesalamine. - Biopsies for surveillance were taken from the cecum, ascending colon, transverse colon, descending colon, sigmoid colon and rectum.--   Current Medications, Allergies, Past Medical History, Past Surgical History, Family History and Social History were reviewed in [employer] record.     Current Outpatient Medications  Medication Sig Dispense Refill   albuterol (VENTOLIN HFA) 108 (90 Base) MCG/ACT inhaler Inhale 2 puffs into the lungs every 6 (six) hours as needed for wheezing or shortness of breath. 18 g 3   amLODipine (NORVASC) 5 MG tablet TAKE 1 TABLET BY MOUTH EVERY DAY 90 tablet 0   atorvastatin (LIPITOR) 40 MG tablet Take 1 tablet (40 mg total) by mouth daily. 90 tablet 3   Continuous Blood Gluc Sensor (DEXCOM G6 SENSOR) MISC 1 Device by Does not apply route as directed. 9 each 3   Continuous Blood Gluc Transmit (DEXCOM G6 TRANSMITTER) MISC 1 Device by Does not apply route as directed. 1 each 3   dapagliflozin propanediol (FARXIGA) 10 MG TABS tablet Take 1 tablet (10 mg total) by mouth daily. 90 tablet 3   dicyclomine (BENTYL) 20 MG tablet Take 1 tablet (20 mg total) by mouth 3 (three) times daily as needed for spasms (abdominal cramping). 20 tablet 0   diphenoxylate-atropine (LOMOTIL) 2.5-0.025 MG tablet Take 1 tablet by mouth 4 (four) times daily as needed for diarrhea or loose stools. 60 tablet 0   DULoxetine (CYMBALTA) 60 MG capsule TAKE 1 CAPSULE BY MOUTH EVERY DAY 90 capsule 1   esomeprazole (NEXIUM) 40 MG capsule TAKE 1 CAPSULE BY MOUTH EVERY DAY BEFORE BREAKFAST 30 capsule 11   fluticasone (FLONASE)  50 MCG/ACT nasal spray SPRAY 2 SPRAYS INTO EACH NOSTRIL EVERY DAY (Patient taking differently: Patient takes as needed) 48 mL 5   gabapentin (NEURONTIN) 300 MG capsule Take 300 mg by mouth at bedtime.     glucose blood (ONETOUCH VERIO) test strip Check blood glucose 3-4 x day. Dx E11.9, E11.649, z79.4, 100 each 12   insulin aspart (NOVOLOG FLEXPEN) 100 UNIT/ML FlexPen Max Daily 50 units 30 mL 11   Insulin Glargine (BASAGLAR KWIKPEN) 100 UNIT/ML Inject 30 Units into the skin at bedtime. 30 mL 4   Insulin Pen Needle 32G X  4 MM MISC 1 Device by Does not apply route in the morning, at noon, in the evening, and at bedtime. 400 each 3   Lancets (ONETOUCH DELICA PLUS [id number]) MISC 1 each by Does not apply route 3 (three) times daily. Dx E11.65, z79.4 100 each 11   lidocaine (LIDODERM) 5 % Place 1 patch onto the skin daily. Remove & Discard patch within 12 hours or as directed by MD 30 patch 0   lidocaine (XYLOCAINE) 2 % solution Use as directed 15 mLs in the mouth or throat as needed for mouth pain. 200 mL 0   methocarbamol (ROBAXIN) 500 MG tablet Take 1 tablet (500 mg total) by mouth every 8 (eight) hours as needed for muscle spasms. 20 tablet 0   metroNIDAZOLE (FLAGYL) 500 MG tablet Take 1 tablet (500 mg total) by mouth 2 (two) times daily. 14 tablet 0   montelukast (SINGULAIR) 10 MG tablet TAKE 1 TABLET BY MOUTH EVERYDAY AT BEDTIME 90 tablet 1   Multiple Vitamins-Minerals (WOMENS 50+ MULTI VITAMIN/MIN PO) Take by mouth.     nitrofurantoin, macrocrystal-monohydrate, (MACROBID) 100 MG capsule Take 1 capsule (100 mg total) by mouth 2 (two) times daily. 10 capsule 0   NONFORMULARY OR COMPOUNDED ITEM Swish and spit 5 mLs as needed (ulcer flare up in mouth). Magic mouthwash with lidocaine disp#150 ml 150 each 1   ondansetron (ZOFRAN ODT) 4 MG disintegrating tablet Take 1 tablet (4 mg total) by mouth every 8 (eight) hours as needed for nausea or vomiting. 20 tablet 0   oxyCODONE-acetaminophen (PERCOCET) 5-325  MG tablet Take 1 tablet by mouth every 6 (six) hours as needed (for chest pain). 20 tablet 0   phenazopyridine (PYRIDIUM) 200 MG tablet Take 1 tablet (200 mg total) by mouth 3 (three) times daily as needed for pain. 12 tablet 0   senna-docusate (SENOKOT-S) 8.6-50 MG tablet Take 1 tablet by mouth at bedtime as needed for mild constipation or moderate constipation. 20 tablet 0   triamcinolone cream (KENALOG) 0.1 % Apply 1 application topically 2 (two) times daily. 30 g 2   mesalamine (LIALDA) 1.2 g EC tablet Take 2 tablets (2.4 g total) by mouth 2 (two) times daily. 360 tablet 3   No current facility-administered medications for this visit.    Review of Systems: No chest pain. No shortness of breath. No urinary complaints.   PHYSICAL EXAM :    Wt Readings from Last 3 Encounters:  07/16/21 158 lb 4 oz (71.8 kg)  06/24/21 157 lb (71.2 kg)  05/31/21 159 lb (72.1 kg)    Ht 5' 2"  (1.575 m)   Wt 158 lb 4 oz (71.8 kg)   BMI 28.94 kg/m  Constitutional:  Generally well appearing female in no acute distress. Psychiatric: Pleasant. Normal mood and affect. Behavior is normal. EENT: Pupils normal.  Conjunctivae are normal. No scleral icterus. Neck supple.  Cardiovascular: Normal rate, regular rhythm. No edema Pulmonary/chest: Effort normal and breath sounds normal. No wheezing, rales or rhonchi. Abdominal: Soft, nondistended, mild diffuse lower abdominal tenderness .  Bowel sounds active throughout. There are no masses palpable. No hepatomegaly. Neurological: Alert and oriented to person place and time. Skin: Skin is warm and dry. No rashes noted.  Tye Savoy, NP  07/16/2021, 8:37 AM       '

## 2021-07-16 NOTE — Patient Instructions (Addendum)
LABS:  Lab work has been ordered for you today, you will also be given the stool test kit to take home. Our lab is located in the basement. Press "B" on the elevator. The lab is located at the first door on the left as you exit the elevator.  HEALTHCARE LAWS AND MY CHART RESULTS: Due to recent changes in healthcare laws, you may see the results of your imaging and laboratory studies on MyChart before your provider has had a chance to review them.   We understand that in some cases there may be results that are confusing or concerning to you. Not all laboratory results come back in the same time frame and the provider may be waiting for multiple results in order to interpret others.  Please give [place] 48 hours in order for your provider to thoroughly review all the results before contacting the office for clarification of your results.   MEDICATION: We have sent the following medication to your pharmacy for you to pick up at your convenience: Compazine rectal suppository, use 1 every 12 hours as needed for nausea and vomiting.  It was great seeing you today! Thank you for entrusting me with your care and choosing [facility].  Tye Savoy, NP  The [facility] providers would like to encourage you to use [facility] to communicate with providers for non-urgent requests or questions.  Due to long hold times on the telephone, sending your provider a message by [facility] may be faster and more efficient way to get a response. Please allow 48 business hours for a response.  Please remember that this is for non-urgent requests/questions. If you are age 37 or older, your body mass index should be between 23-30. Your Body mass index is 28.94 kg/m. If this is out of the aforementioned range listed, please consider follow up with your Primary Care Provider.  If you are age 39 or younger, your body mass index should be between 19-25. Your Body mass index is 28.94 kg/m. If this is out of the  aformentioned range listed, please consider follow up with your Primary Care Provider.

## 2021-07-18 DIAGNOSIS — E1169 Type 2 diabetes mellitus with other specified complication: Secondary | ICD-10-CM | POA: Diagnosis not present

## 2021-07-22 ENCOUNTER — Other Ambulatory Visit: Payer: Medicare Other

## 2021-07-22 DIAGNOSIS — R112 Nausea with vomiting, unspecified: Secondary | ICD-10-CM

## 2021-07-22 DIAGNOSIS — R197 Diarrhea, unspecified: Secondary | ICD-10-CM

## 2021-07-22 DIAGNOSIS — K51819 Other ulcerative colitis with unspecified complications: Secondary | ICD-10-CM | POA: Diagnosis not present

## 2021-07-24 LAB — CLOSTRIDIUM DIFFICILE TOXIN B, QUALITATIVE, REAL-TIME PCR: Toxigenic C. Difficile by PCR: NOT DETECTED

## 2021-07-24 LAB — CALPROTECTIN, FECAL: Calprotectin, Fecal: 375 ug/g — ABNORMAL HIGH (ref 0–120)

## 2021-08-05 ENCOUNTER — Other Ambulatory Visit: Payer: Self-pay

## 2021-08-05 MED ORDER — METOCLOPRAMIDE HCL 10 MG PO TABS: 10.0000 mg | ORAL_TABLET | Freq: Four times a day (QID) | ORAL | 0 refills | Status: DC

## 2021-08-05 MED ORDER — NA SULFATE-K SULFATE-MG SULF 17.5-3.13-1.6 GM/177ML PO SOLN: ORAL | 0 refills | Status: DC

## 2021-08-07 DIAGNOSIS — G44209 Tension-type headache, unspecified, not intractable: Secondary | ICD-10-CM | POA: Diagnosis not present

## 2021-08-07 DIAGNOSIS — E1169 Type 2 diabetes mellitus with other specified complication: Secondary | ICD-10-CM | POA: Diagnosis not present

## 2021-08-07 DIAGNOSIS — R42 Dizziness and giddiness: Secondary | ICD-10-CM | POA: Diagnosis not present

## 2021-08-07 DIAGNOSIS — E785 Hyperlipidemia, unspecified: Secondary | ICD-10-CM | POA: Diagnosis not present

## 2021-08-07 DIAGNOSIS — G8929 Other chronic pain: Secondary | ICD-10-CM | POA: Diagnosis not present

## 2021-08-07 DIAGNOSIS — M797 Fibromyalgia: Secondary | ICD-10-CM | POA: Diagnosis not present

## 2021-08-12 DIAGNOSIS — M47892 Other spondylosis, cervical region: Secondary | ICD-10-CM | POA: Diagnosis not present

## 2021-08-12 DIAGNOSIS — M791 Myalgia, unspecified site: Secondary | ICD-10-CM | POA: Diagnosis not present

## 2021-08-13 ENCOUNTER — Telehealth: Payer: Self-pay

## 2021-08-13 ENCOUNTER — Other Ambulatory Visit: Payer: Self-pay

## 2021-08-13 DIAGNOSIS — R112 Nausea with vomiting, unspecified: Secondary | ICD-10-CM

## 2021-08-13 DIAGNOSIS — R197 Diarrhea, unspecified: Secondary | ICD-10-CM

## 2021-08-13 DIAGNOSIS — K51819 Other ulcerative colitis with unspecified complications: Secondary | ICD-10-CM

## 2021-08-13 NOTE — Telephone Encounter (Signed)
Called to touch base with the patient. Checking to see if the procedure instructions have arrived. Didi she have any questions or concerns? Left my name and contact information.

## 2021-08-15 DIAGNOSIS — E785 Hyperlipidemia, unspecified: Secondary | ICD-10-CM | POA: Diagnosis not present

## 2021-08-18 DIAGNOSIS — E1169 Type 2 diabetes mellitus with other specified complication: Secondary | ICD-10-CM | POA: Diagnosis not present

## 2021-08-28 ENCOUNTER — Encounter: Payer: Self-pay | Admitting: Internal Medicine

## 2021-08-28 ENCOUNTER — Other Ambulatory Visit: Payer: Self-pay

## 2021-08-28 ENCOUNTER — Ambulatory Visit (INDEPENDENT_AMBULATORY_CARE_PROVIDER_SITE_OTHER): Payer: HMO | Admitting: Internal Medicine

## 2021-08-28 VITALS — BP 138/72 | HR 78 | Ht 62.0 in | Wt 158.6 lb

## 2021-08-28 DIAGNOSIS — Z794 Long term (current) use of insulin: Secondary | ICD-10-CM

## 2021-08-28 DIAGNOSIS — E1165 Type 2 diabetes mellitus with hyperglycemia: Secondary | ICD-10-CM | POA: Diagnosis not present

## 2021-08-28 LAB — POCT GLYCOSYLATED HEMOGLOBIN (HGB A1C): Hemoglobin A1C: 7.6 % — AB (ref 4.0–5.6)

## 2021-08-28 NOTE — Progress Notes (Signed)
Name: Beverly Ramirez  Age/ Sex: 60 y.o., female   MRN/ DOB: [id number], 09-Jan-1961     PCP: Pcp, No   Reason for Endocrinology Evaluation: Type 2 Diabetes Mellitus  Initial Endocrine Consultative Visit: 07/23/2020    PATIENT IDENTIFIER: [name] is a 60 y.o. female with a past medical history of T2DM, OSA, fibromyalgia and UC. The patient has followed with Endocrinology clinic since 07/23/2020 for consultative assistance with management of her diabetes.  DIABETIC HISTORY:  [name] was diagnosed with DM in 2019.Has been on insulin intermittently while on prednisone,  Metformin- too sick. Marland Kitchen Her hemoglobin A1c has ranged from 7.1%in 2021 , peaking at 9.5% in 2020    On her initial visit to our clinic she had an A1c of 7.2 % she was on basal insulin and Novolog, We added farxiga    SUBJECTIVE:   During the last visit (05/31/2021): A1c 8.7 % Adjusted  MDI regimen and continued [place]      Today (08/28/2021): [name] is here for a follow up diabetes management.  She checks her blood sugars multiple times a day through freestyle libre    She had an ED visit for rule out PE for  right leg pain, and cough, she had another ED visit for chest pain Patient had a follow-up by GI 07/16/2021 for ulcerative colitis   She is a liquid diet  She reduced her basaglar due to hypoglycemia    HOME DIABETES REGIMEN:  Farxiga 10 mg, 1 tablet with breakfast  Basaglar 30 units daily- she is taking 20 units  Novolog 10 units with lunch and 15 with supper- takes 18 units with each meal  CF : Novolog ( BG -130/25)     Statin: yes ACE-I/ARB: no    CONTINUOUS GLUCOSE MONITORING RECORD INTERPRETATION    Dates of Recording: 12/8-12/21/2022  Sensor description: freestyle libre2   Results statistics:   CGM use % of time 72  Average and SD 162/24.3  Time in range     69   %  % Time Above 180 30  % Time above 250 1  % Time Below target 0       Glycemic patterns summary: BG's optimal at night , with the occasional hyperglycemia during the day   Hyperglycemic episodes  postprandial  Hypoglycemic episodes occurred n/a  Overnight periods: trends down       DIABETIC COMPLICATIONS: Microvascular complications:   Denies: CKD, retinopathy, neuropathy Last Eye Exam: Completed 06/2020  Macrovascular complications:   Denies: CAD, CVA, PVD   HISTORY:  Past Medical History:  Past Medical History:  Diagnosis Date   Anemia    Carpal tunnel syndrome    bilateral   Chronic heel pain    Diabetes mellitus without complication (HCC)    DJD (degenerative joint disease) of cervical spine    MRI 2017   Hyperlipidemia    Plantar fasciitis    bilateral   Sickle cell anemia (HCC)    sickle cell trait   Sleep apnea    Tendonitis    left hand/wrist   UC (ulcerative colitis) ([place])    Past Surgical History:  Past Surgical History:  Procedure Laterality Date   ABDOMINAL HYSTERECTOMY     APPENDECTOMY     CESAREAN SECTION     x4   COLONOSCOPY     Multiple in [place]   ESOPHAGOGASTRODUODENOSCOPY     Social History:  reports that she quit smoking about 14 years ago. Her smoking  use included cigarettes. She has never used smokeless tobacco. She reports current alcohol use. She reports that she does not use drugs. Family History:  Family History  Problem Relation Age of Onset   Stroke Mother    Diabetes Mother    Hypertension Mother    Lung cancer Mother    Sickle cell trait Mother    Diabetes Maternal Aunt    Lung cancer Maternal Aunt    Lung cancer Maternal Aunt    Lung cancer Maternal Aunt    Colon cancer Cousin 30   Sickle cell trait Father    Sickle cell anemia Sister 32   Lung cancer Maternal Grandmother    Sickle cell anemia Sister    Other Sister        accident and blood clot formed     HOME MEDICATIONS: Allergies as of 08/28/2021       Reactions   Sulfa Antibiotics Hives    Sulfamethoxazole-trimethoprim Hives, Itching   Codeine Itching, Rash, Hives   Latex Hives   With the power        Medication List        Accurate as of August 28, 2021  1:27 PM. If you have any questions, ask your nurse or doctor.          STOP taking these medications    Dexcom G6 Sensor Misc Stopped by: Dorita Sciara, MD   Dexcom G6 Transmitter Misc Stopped by: Dorita Sciara, MD       TAKE these medications    albuterol 108 (90 Base) MCG/ACT inhaler Commonly known as: VENTOLIN HFA Inhale 2 puffs into the lungs every 6 (six) hours as needed for wheezing or shortness of breath.   amLODipine 5 MG tablet Commonly known as: NORVASC TAKE 1 TABLET BY MOUTH EVERY DAY   atorvastatin 40 MG tablet Commonly known as: LIPITOR Take 1 tablet (40 mg total) by mouth daily.   Basaglar KwikPen 100 UNIT/ML Inject 30 Units into the skin at bedtime. What changed: how much to take   chlorzoxazone 500 MG tablet Commonly known as: PARAFON Take 500 mg by mouth 3 (three) times daily as needed.   dapagliflozin propanediol 10 MG Tabs tablet Commonly known as: Farxiga Take 1 tablet (10 mg total) by mouth daily.   diazepam 5 MG tablet Commonly known as: VALIUM SMARTSIG:1 Tablet(s) By Mouth   dicyclomine 20 MG tablet Commonly known as: BENTYL Take 1 tablet (20 mg total) by mouth 3 (three) times daily as needed for spasms (abdominal cramping).   diphenoxylate-atropine 2.5-0.025 MG tablet Commonly known as: LOMOTIL Take 1 tablet by mouth 4 (four) times daily as needed for diarrhea or loose stools.   DULoxetine 60 MG capsule Commonly known as: CYMBALTA Take 1 tablet by mouth daily.   DULoxetine 60 MG capsule Commonly known as: CYMBALTA TAKE 1 CAPSULE BY MOUTH EVERY DAY   escitalopram 10 MG tablet Commonly known as: LEXAPRO Take 10 mg by mouth daily.   esomeprazole 40 MG capsule Commonly known as: NEXIUM TAKE 1 CAPSULE BY MOUTH EVERY DAY BEFORE  BREAKFAST   fluticasone 50 MCG/ACT nasal spray Commonly known as: FLONASE SPRAY 2 SPRAYS INTO EACH NOSTRIL EVERY DAY What changed: See the new instructions.   gabapentin 300 MG capsule Commonly known as: NEURONTIN Take 300 mg by mouth at bedtime.   Insulin Pen Needle 32G X 4 MM Misc 1 Device by Does not apply route in the morning, at noon, in the evening, and at bedtime.  lidocaine 2 % solution Commonly known as: XYLOCAINE Use as directed 15 mLs in the mouth or throat as needed for mouth pain.   lidocaine 5 % Commonly known as: Lidoderm Place 1 patch onto the skin daily. Remove & Discard patch within 12 hours or as directed by MD   lisinopril 10 MG tablet Commonly known as: ZESTRIL Take 10 mg by mouth daily.   mesalamine 1.2 g EC tablet Commonly known as: LIALDA Take 2 tablets (2.4 g total) by mouth 2 (two) times daily.   methocarbamol 500 MG tablet Commonly known as: ROBAXIN 1.5 tablets   methocarbamol 500 MG tablet Commonly known as: ROBAXIN Take 1 tablet (500 mg total) by mouth every 8 (eight) hours as needed for muscle spasms.   metoCLOPramide 10 MG tablet Commonly known as: Reglan Take 1 tablet (10 mg total) by mouth 4 (four) times daily.   montelukast 10 MG tablet Commonly known as: SINGULAIR 1 tablet   montelukast 10 MG tablet Commonly known as: SINGULAIR TAKE 1 TABLET BY MOUTH EVERYDAY AT BEDTIME   Na Sulfate-K Sulfate-Mg Sulf 17.5-3.13-1.6 GM/177ML Soln Take as directed by [facility] for split dose prepping   NONFORMULARY OR COMPOUNDED ITEM Swish and spit 5 mLs as needed (ulcer flare up in mouth). Magic mouthwash with lidocaine disp#150 ml   NovoLOG FlexPen 100 UNIT/ML FlexPen Generic drug: insulin aspart Max Daily 50 units   ondansetron 4 MG disintegrating tablet Commonly known as: Zofran ODT Take 1 tablet (4 mg total) by mouth every 8 (eight) hours as needed for nausea or vomiting.   OneTouch Delica Plus [id number] Misc 1 each by Does not  apply route 3 (three) times daily. Dx E11.65, z79.4   OneTouch Verio test strip Generic drug: glucose blood Check blood glucose 3-4 x day. Dx E11.9, E11.649, z79.4,   phenazopyridine 200 MG tablet Commonly known as: PYRIDIUM Take 1 tablet (200 mg total) by mouth 3 (three) times daily as needed for pain.   prochlorperazine 25 MG suppository Commonly known as: COMPAZINE Place 1 suppository (25 mg total) rectally every 12 (twelve) hours as needed for nausea or vomiting.   rosuvastatin 40 MG tablet Commonly known as: CRESTOR 1 tablet   saccharomyces boulardii 250 MG capsule Commonly known as: FLORASTOR See admin instructions.   senna-docusate 8.6-50 MG tablet Commonly known as: Senokot-S Take 1 tablet by mouth at bedtime as needed for mild constipation or moderate constipation.   triamcinolone cream 0.1 % Commonly known as: KENALOG Apply 1 application topically 2 (two) times daily.   WOMENS 50+ MULTI VITAMIN/MIN PO Take by mouth.         OBJECTIVE:   Vital Signs: BP 138/72 (BP Location: Left Arm, Patient Position: Sitting, Cuff Size: Small)    Pulse 78    Ht 5' 2"  (1.575 m)    Wt 158 lb 9.6 oz (71.9 kg)    SpO2 99%    BMI 29.01 kg/m   Wt Readings from Last 3 Encounters:  08/28/21 158 lb 9.6 oz (71.9 kg)  07/16/21 158 lb 4 oz (71.8 kg)  06/24/21 157 lb (71.2 kg)     Exam: General: Pt appears well and is in NAD  Lungs: Clear with good BS bilat with no rales, rhonchi, or wheezes  Heart: RRR with normal S1 and S2 and no gallops; no murmurs; no rub  Abdomen: Normoactive bowel sounds, soft, nontender, without masses or organomegaly palpable  Extremities: No pretibial edema.   Neuro: MS is good with appropriate affect, pt  is alert and Ox3      DM Foot Exam 10/26/2020 The skin of the feet is intact without sores or ulcerations. The pedal pulses are 2+ on right and 2+ on left. The sensation is intact to a screening 5.07, 10 gram monofilament bilaterally         DATA REVIEWED:  Lab Results  Component Value Date   HGBA1C 6.4 (A) 10/26/2020   HGBA1C 7.2 (A) 07/23/2020   HGBA1C 7.1 (H) 04/20/2020   Lab Results  Component Value Date   LDLCALC 179 (H) 02/02/2020   CREATININE 0.79 06/25/2021   Lab Results  Component Value Date   MICRALBCREAT <25 01/14/2019     Lab Results  Component Value Date   CHOL 250 (H) 02/02/2020   HDL 40 02/02/2020   LDLCALC 179 (H) 02/02/2020   TRIG 167 (H) 02/02/2020   CHOLHDL 6.3 (H) 02/02/2020         ASSESSMENT / PLAN / RECOMMENDATIONS:   1) Type 2 Diabetes Mellitus, Sub-Optimally controlled, Without complications - Most recent A1c of 7.6 % %. Goal A1c < 7.0 %.    - A1c improved , down from 8.7 %  - She continues to self adjust her insulin , she is on less basal insulin and more prandial insulin then previously discussed, she does not used the correction scale provided but self increases - I have discouraged the pt from self adjusting her insulin , hence the glycemic excursions she complaints about  - I have discussed in detail how to use correction scale. She needs to take standing Dose of NOvolog 14 units with a meal PLUS addional units based on the scale.  - She is currently on a liquid diet in preperation for colonoscopy, she was advised to avoid sugar- sweetened beverages and choose sugar-free drinks or jello etc . She was advised to hold off on taking NOvolog 14 units since she is NOT going to eat a meal BUT CONTINUE to use the scale if needed    MEDICATIONS: -Continue Farxiga  10 mg, 1 tablet with breakfast  - Continue Basaglar 20 units daily  -Take Novolog to 14 units with meals  - CF : Novolog ( BG -130/30)  EDUCATION / INSTRUCTIONS: BG monitoring instructions: Patient is instructed to check her blood sugars 3 times a day, before meals . Call [facility] if: BG persistently < 70 I reviewed the Rule of 15 for the treatment of hypoglycemia in detail with the patient.  Literature supplied.   2) Diabetic complications:  Eye: Does not have known diabetic retinopathy.  Neuro/ Feet: Does not have known diabetic peripheral neuropathy .  Renal: Patient does not have known baseline CKD. She   is on an ACEI/ARB at present.     F/U in 4 months    Signed electronically by: Mack Guise, MD  [facility]  [facility] [address] [place], [place] [address] Phone: [phone] FAX: [phone]   CC: Pcp, No No address on file Phone: None  Fax: None  Return to Endocrinology clinic as below: Future Appointments  Date Time Provider [place]  08/28/2021  1:40 PM Ruthmary Occhipinti, Melanie Crazier, MD [facility] None  08/29/2021  2:00 PM Pyrtle, Lajuan Lines, MD [facility]

## 2021-08-28 NOTE — Patient Instructions (Addendum)
-   Continue Farxiga  10 mg, 1 tablet with breakfast  - Continue  Basaglar 20  units daily  - Take Novolog 14 units with meals  -Novolog correctional insulin: ADD extra units on insulin to your meal-time Novolog dose if your blood sugars are higher than 160. Use the scale below to help guide you:   Blood sugar before meal Number of units to inject  Less than 160 0 unit  161 - 190 1 units  191 - 220 2 units  221 -  250 3 units  251 -  280 4 units  281 -  310 5 units     HOW TO TREAT LOW BLOOD SUGARS (Blood sugar LESS THAN 70 MG/DL) Please follow the RULE OF 15 for the treatment of hypoglycemia treatment (when your (blood sugars are less than 70 mg/dL)   STEP 1: Take 15 grams of carbohydrates when your blood sugar is low, which includes:  4 GLUCOSE TABS  OR 4 OZ OF JUICE OR REGULAR SODA OR ONE TUBE OF GLUCOSE GEL    STEP 2: RECHECK blood sugar in 15 MINUTES STEP 3: If your blood sugar is still low at the 15 minute recheck --> then, go back to STEP 1 and treat AGAIN with another 15 grams of carbohydrates

## 2021-08-29 ENCOUNTER — Encounter: Payer: Self-pay | Admitting: Internal Medicine

## 2021-08-29 ENCOUNTER — Ambulatory Visit (AMBULATORY_SURGERY_CENTER): Payer: Medicare Other | Admitting: Internal Medicine

## 2021-08-29 VITALS — BP 143/84 | HR 64 | Temp 96.8°F | Resp 11 | Ht 62.0 in | Wt 158.0 lb

## 2021-08-29 DIAGNOSIS — K51819 Other ulcerative colitis with unspecified complications: Secondary | ICD-10-CM | POA: Diagnosis not present

## 2021-08-29 DIAGNOSIS — R197 Diarrhea, unspecified: Secondary | ICD-10-CM | POA: Diagnosis not present

## 2021-08-29 DIAGNOSIS — I10 Essential (primary) hypertension: Secondary | ICD-10-CM | POA: Diagnosis not present

## 2021-08-29 DIAGNOSIS — K2970 Gastritis, unspecified, without bleeding: Secondary | ICD-10-CM | POA: Diagnosis not present

## 2021-08-29 DIAGNOSIS — K2950 Unspecified chronic gastritis without bleeding: Secondary | ICD-10-CM | POA: Diagnosis not present

## 2021-08-29 DIAGNOSIS — R110 Nausea: Secondary | ICD-10-CM | POA: Diagnosis not present

## 2021-08-29 DIAGNOSIS — R112 Nausea with vomiting, unspecified: Secondary | ICD-10-CM

## 2021-08-29 DIAGNOSIS — K5100 Ulcerative (chronic) pancolitis without complications: Secondary | ICD-10-CM

## 2021-08-29 MED ORDER — SODIUM CHLORIDE 0.9 % IV SOLN: 500.0000 mL | Freq: Once | INTRAVENOUS | Status: DC

## 2021-08-29 NOTE — Op Note (Signed)
[facility] Patient Name: Beverly Ramirez Procedure Date: 08/29/2021 2:02 PM MRN: [id number] Endoscopist: Jerene Bears , MD Age: 60 Referring MD:  Date of Birth: [birth year] Gender: Female Account #: [id number] Procedure:                Colonoscopy Indications:              High risk colon cancer surveillance: Ulcerative                            pancolitis of 8 (or more) years duration, Last                            colonoscopy: December 2020; recent issues with                            nausea/vomiting/diarrhea and elevated fecal                            calprotectin = 375; current therapy Lialda 4.8 g                            daily (previously Humira and Entyvio) Medicines:                Monitored Anesthesia Care Procedure:                Pre-Anesthesia Assessment:                           - Prior to the procedure, a History and Physical                            was performed, and patient medications and                            allergies were reviewed. The patient's tolerance of                            previous anesthesia was also reviewed. The risks                            and benefits of the procedure and the sedation                            options and risks were discussed with the patient.                            All questions were answered, and informed consent                            was obtained. Prior Anticoagulants: The patient has                            taken no previous anticoagulant or antiplatelet  agents. ASA Grade Assessment: II - A patient with                            mild systemic disease. After reviewing the risks                            and benefits, the patient was deemed in                            satisfactory condition to undergo the procedure.                           After obtaining informed consent, the colonoscope                            was passed under direct vision.  Throughout the                            procedure, the patient's blood pressure, pulse, and                            oxygen saturations were monitored continuously. The                            Olympus [id number]-[id number] (#[id number]) Colonoscope was                            introduced through the anus and advanced to the                            terminal ileum. The colonoscopy was performed                            without difficulty. The patient tolerated the                            procedure well. The quality of the bowel                            preparation was excellent. The terminal ileum,                            ileocecal valve, appendiceal orifice, and rectum                            were photographed. Scope In: 2:20:11 PM Scope Out: 2:35:58 PM Scope Withdrawal Time: 0 hours 13 minutes 40 seconds  Total Procedure Duration: 0 hours 15 minutes 47 seconds  Findings:                 The digital rectal exam was normal.                           The terminal ileum appeared normal.  Inflammation was not found based on the endoscopic                            appearance of the mucosa in the colon. This was                            graded as Mayo Score 0 (normal or inactive                            disease), and when compared to the previous                            examination, the findings are quiescent. Four                            biopsies were taken every 10 cm with a cold forceps                            from the entire colon for dysplasia surveillance                            and ulcerative colitis surveillance. These biopsy                            specimens from the cecum/ascending colon (Jar 2),                            transverse colon (Jar 3), descending colon/proximal                            sigmoid colon (Jar 4) and distal sigmoid colon and                            rectum (Jar 5) were sent to Pathology.                            The retroflexed view of the distal rectum and anal                            verge was normal and showed no anal or rectal                            abnormalities. Complications:            No immediate complications. Estimated Blood Loss:     Estimated blood loss was minimal. Impression:               - The examined portion of the ileum was normal.                           - Inactive (Mayo Score 0) quiescent ulcerative                            colitis, quiescent  since the last examination.                            Biopsied.                           - The distal rectum and anal verge are normal on                            retroflexion view. Recommendation:           - Patient has a contact number available for                            emergencies. The signs and symptoms of potential                            delayed complications were discussed with the                            patient. Return to normal activities tomorrow.                            Written discharge instructions were provided to the                            patient.                           - Resume previous diet.                           - Continue present medications.                           - Await pathology results.                           - Further recommendations per Dr. Carlean Purl when                            pathology results available.                           - Repeat colonoscopy is recommended for                            surveillance. The colonoscopy date will be                            determined after pathology results from today's                            exam become available for review. Jerene Bears, MD 08/29/2021 2:44:53 PM This report has been signed electronically.

## 2021-08-29 NOTE — Progress Notes (Signed)
Report given to PACU, vss 

## 2021-08-29 NOTE — Progress Notes (Signed)
Called to room to assist during endoscopic procedure.  Patient ID and intended procedure confirmed with present staff. Received instructions for my participation in the procedure from the performing physician.  

## 2021-08-29 NOTE — Patient Instructions (Signed)
Resume previous medications.  Biopsies taken.  Await results for final recommendations from Dr.Gessner.  Handouts on findings given to patient.     YOU HAD AN ENDOSCOPIC PROCEDURE TODAY AT [facility]:   Refer to the procedure report that was given to you for any specific questions about what was found during the examination.  If the procedure report does not answer your questions, please call your gastroenterologist to clarify.  If you requested that your care partner not be given the details of your procedure findings, then the procedure report has been included in a sealed envelope for you to review at your convenience later.  YOU SHOULD EXPECT: Some feelings of bloating in the abdomen. Passage of more gas than usual.  Walking can help get rid of the air that was put into your GI tract during the procedure and reduce the bloating. If you had a lower endoscopy (such as a colonoscopy or flexible sigmoidoscopy) you may notice spotting of blood in your stool or on the toilet paper. If you underwent a bowel prep for your procedure, you may not have a normal bowel movement for a few days.  Please Note:  You might notice some irritation and congestion in your nose or some drainage.  This is from the oxygen used during your procedure.  There is no need for concern and it should clear up in a day or so.  SYMPTOMS TO REPORT IMMEDIATELY:  Following lower endoscopy (colonoscopy or flexible sigmoidoscopy):  Excessive amounts of blood in the stool  Significant tenderness or worsening of abdominal pains  Swelling of the abdomen that is new, acute  Fever of 100F or higher  Following upper endoscopy (EGD)  Vomiting of blood or coffee ground material  New chest pain or pain under the shoulder blades  Painful or persistently difficult swallowing  New shortness of breath  Fever of 100F or higher  Black, tarry-looking stools  For urgent or emergent issues, a gastroenterologist can be reached  at any hour by calling [phone]. Do not use MyChart messaging for urgent concerns.    DIET:  We do recommend a small meal at first, but then you may proceed to your regular diet.  Drink plenty of fluids but you should avoid alcoholic beverages for 24 hours.  ACTIVITY:  You should plan to take it easy for the rest of today and you should NOT DRIVE or use heavy machinery until tomorrow (because of the sedation medicines used during the test).    FOLLOW UP: Our staff will call the number listed on your records 48-72 hours following your procedure to check on you and address any questions or concerns that you may have regarding the information given to you following your procedure. If we do not reach you, we will leave a message.  We will attempt to reach you two times.  During this call, we will ask if you have developed any symptoms of COVID 19. If you develop any symptoms (ie: fever, flu-like symptoms, shortness of breath, cough etc.) before then, please call [phone].  If you test positive for Covid 19 in the 2 weeks post procedure, please call and report this information to [place].    If any biopsies were taken you will be contacted by phone or by letter within the next 1-3 weeks.  Please call us at [phone] if you have not heard about the biopsies in 3 weeks.    SIGNATURES/CONFIDENTIALITY: You and/or your care partner have signed  paperwork which will be entered into your electronic medical record.  These signatures attest to the fact that that the information above on your After Visit Summary has been reviewed and is understood.  Full responsibility of the confidentiality of this discharge information lies with you and/or your care-partner.

## 2021-08-29 NOTE — Progress Notes (Signed)
Late entry: 1409 Robinul 0.1 mg IV given due large amount of secretions upon assessment.  MD made aware, vss

## 2021-08-29 NOTE — Progress Notes (Signed)
Pt up to restroom.  She was able to pass large amounts of air and feeling less bloated.

## 2021-08-29 NOTE — Op Note (Signed)
[place] Patient Name: Beverly Ramirez Procedure Date: 08/29/2021 2:02 PM MRN: [id number] Endoscopist: Jerene Bears , MD Age: 60 Referring MD:  Date of Birth: [birth year] Gender: Female Account #: [id number] Procedure:                Upper GI endoscopy Indications:              Nausea with vomiting, personal history of                            ulcerative colitis with recent flare and elevated                            fecal calprotectin Medicines:                Monitored Anesthesia Care Procedure:                Pre-Anesthesia Assessment:                           - Prior to the procedure, a History and Physical                            was performed, and patient medications and                            allergies were reviewed. The patient's tolerance of                            previous anesthesia was also reviewed. The risks                            and benefits of the procedure and the sedation                            options and risks were discussed with the patient.                            All questions were answered, and informed consent                            was obtained. Prior Anticoagulants: The patient has                            taken no previous anticoagulant or antiplatelet                            agents. ASA Grade Assessment: II - A patient with                            mild systemic disease. After reviewing the risks                            and benefits, the patient was deemed in  satisfactory condition to undergo the procedure.                           After obtaining informed consent, the endoscope was                            passed under direct vision. Throughout the                            procedure, the patient's blood pressure, pulse, and                            oxygen saturations were monitored continuously. The                            Endoscope was introduced through the  mouth, and                            advanced to the second part of duodenum. The upper                            GI endoscopy was accomplished without difficulty.                            The patient tolerated the procedure well. Scope In: Scope Out: Findings:                 The examined esophagus was normal.                           Patchy mildly erythematous mucosa without bleeding                            was found in the gastric antrum. Biopsies were                            taken with a cold forceps for histology and                            Helicobacter pylori testing.                           The cardia and gastric fundus were normal on                            retroflexion.                           The examined duodenum was normal. Complications:            No immediate complications. Estimated Blood Loss:     Estimated blood loss was minimal. Impression:               - Normal esophagus.                           - Erythematous  mucosa in the antrum. Biopsied.                           - Normal examined duodenum. Recommendation:           - Patient has a contact number available for                            emergencies. The signs and symptoms of potential                            delayed complications were discussed with the                            patient. Return to normal activities tomorrow.                            Written discharge instructions were provided to the                            patient.                           - Resume previous diet.                           - Continue present medications.                           - Await pathology results.                           - See the other procedure note for documentation of                            additional recommendations. Jerene Bears, MD 08/29/2021 2:39:56 PM This report has been signed electronically.

## 2021-08-29 NOTE — Progress Notes (Signed)
KP vitals and EW IV.

## 2021-08-29 NOTE — Progress Notes (Signed)
GASTROENTEROLOGY PROCEDURE H&P NOTE   Primary Care Physician: Pcp, No    Reason for Procedure:  Chronic ulcerative colitis, nausea and vomiting  Plan:    EGD and colonoscopy  Patient is appropriate for endoscopic procedure(s) in the ambulatory ([place]) setting.  The nature of the procedure, as well as the risks, benefits, and alternatives were carefully and thoroughly reviewed with the patient. Ample time for discussion and questions allowed. The patient understood, was satisfied, and agreed to proceed.     HPI: Beverly Ramirez is a 60 y.o. female who presents for EGD and colonoscopy.  Seen in the office in November by Tye Savoy, NP.  Followed by Silvano Rusk, MD.  See that note for details.  After the visit fecal calprotectin was elevated at 375.  C. difficile was negative.  Tolerated the prep.  No chest pain or shortness of breath.  Past Medical History:  Diagnosis Date   Anemia    Anxiety    Carpal tunnel syndrome    bilateral   Chronic heel pain    Diabetes mellitus without complication (HCC)    DJD (degenerative joint disease) of cervical spine    MRI 2017   Hyperlipidemia    Hypertension    Plantar fasciitis    bilateral   Sickle cell anemia (HCC)    sickle cell trait   Sleep apnea    Tendonitis    left hand/wrist   UC (ulcerative colitis) ([place])     Past Surgical History:  Procedure Laterality Date   ABDOMINAL HYSTERECTOMY     APPENDECTOMY     CESAREAN SECTION     x4   COLONOSCOPY     Multiple in [place]   ESOPHAGOGASTRODUODENOSCOPY      Prior to Admission medications   Medication Sig Start Date End Date Taking? Authorizing Provider  amLODipine (NORVASC) 5 MG tablet TAKE 1 TABLET BY MOUTH EVERY DAY 06/08/20  Yes Jacelyn Pi, Lilia Argue, MD  dapagliflozin propanediol (FARXIGA) 10 MG TABS tablet Take 1 tablet (10 mg total) by mouth daily. 10/26/20  Yes Shamleffer, Melanie Crazier, MD  dicyclomine (BENTYL) 20 MG tablet Take 1 tablet (20 mg  total) by mouth 3 (three) times daily as needed for spasms (abdominal cramping). 10/18/20  Yes Wendie Agreste, MD  diphenoxylate-atropine (LOMOTIL) 2.5-0.025 MG tablet Take 1 tablet by mouth 4 (four) times daily as needed for diarrhea or loose stools. 04/27/20  Yes Gatha Mayer, MD  DULoxetine (CYMBALTA) 60 MG capsule TAKE 1 CAPSULE BY MOUTH EVERY DAY 03/20/20  Yes Jacelyn Pi, Irma M, MD  escitalopram (LEXAPRO) 10 MG tablet Take 10 mg by mouth daily. 08/07/21  Yes [provider]  gabapentin (NEURONTIN) 300 MG capsule Take 300 mg by mouth at bedtime. 05/30/21  Yes [provider]  glucose blood (ONETOUCH VERIO) test strip Check blood glucose 3-4 x day. Dx E11.9, E11.649, z79.4, 11/06/20  Yes Maximiano Coss, NP  insulin aspart (NOVOLOG FLEXPEN) 100 UNIT/ML FlexPen Max Daily 50 units 05/31/21  Yes Shamleffer, Melanie Crazier, MD  Insulin Glargine (BASAGLAR KWIKPEN) 100 UNIT/ML Inject 30 Units into the skin at bedtime. Patient taking differently: Inject 17 Units into the skin at bedtime. 05/31/21  Yes Shamleffer, Melanie Crazier, MD  Insulin Pen Needle 32G X 4 MM MISC 1 Device by Does not apply route in the morning, at noon, in the evening, and at bedtime. 05/31/21  Yes Shamleffer, Melanie Crazier, MD  Lancets (ONETOUCH DELICA PLUS [id number]) [place] 1 each by Does not  apply route 3 (three) times daily. Dx E11.65, z79.4 01/24/19  Yes Jacelyn Pi, Lilia Argue, MD  lisinopril (ZESTRIL) 10 MG tablet Take 10 mg by mouth daily. 08/07/21  Yes [provider]  montelukast (SINGULAIR) 10 MG tablet TAKE 1 TABLET BY MOUTH EVERYDAY AT BEDTIME 11/27/20  Yes Just, Laurita Quint, FNP  Multiple Vitamins-Minerals (WOMENS 50+ MULTI VITAMIN/MIN PO) Take by mouth.   Yes [provider]  rosuvastatin (CRESTOR) 40 MG tablet 1 tablet   Yes [provider]  saccharomyces boulardii (FLORASTOR) 250 MG capsule See admin instructions. 06/24/21  Yes [provider]  albuterol (VENTOLIN  HFA) 108 (90 Base) MCG/ACT inhaler Inhale 2 puffs into the lungs every 6 (six) hours as needed for wheezing or shortness of breath. 03/13/20   Jacelyn Pi, Lilia Argue, MD  chlorzoxazone (PARAFON) 500 MG tablet Take 500 mg by mouth 3 (three) times daily as needed. 08/12/21   [provider]  diazepam (VALIUM) 5 MG tablet SMARTSIG:1 Tablet(s) By Mouth 08/12/21   [provider]  esomeprazole (NEXIUM) 40 MG capsule TAKE 1 CAPSULE BY MOUTH EVERY DAY BEFORE BREAKFAST 07/20/20   Gatha Mayer, MD  fluticasone (FLONASE) 50 MCG/ACT nasal spray SPRAY 2 SPRAYS INTO EACH NOSTRIL EVERY DAY Patient taking differently: Patient takes as needed 03/08/20   Jacelyn Pi, Lilia Argue, MD  lidocaine (LIDODERM) 5 % Place 1 patch onto the skin daily. Remove & Discard patch within 12 hours or as directed by MD 02/12/21   Loni Beckwith, PA-C  lidocaine (XYLOCAINE) 2 % solution Use as directed 15 mLs in the mouth or throat as needed for mouth pain. 03/09/20   Gatha Mayer, MD  mesalamine (LIALDA) 1.2 g EC tablet Take 2 tablets (2.4 g total) by mouth 2 (two) times daily. 04/28/20 04/23/21  Gatha Mayer, MD  methocarbamol (ROBAXIN) 500 MG tablet Take 1 tablet (500 mg total) by mouth every 8 (eight) hours as needed for muscle spasms. 02/11/21   Long, Wonda Olds, MD  metoCLOPramide (REGLAN) 10 MG tablet Take 1 tablet (10 mg total) by mouth 4 (four) times daily. 08/05/21   Willia Craze, NP  NONFORMULARY OR COMPOUNDED ITEM Swish and spit 5 mLs as needed (ulcer flare up in mouth). Magic mouthwash with lidocaine disp#150 ml 05/04/20   Gatha Mayer, MD  ondansetron (ZOFRAN ODT) 4 MG disintegrating tablet Take 1 tablet (4 mg total) by mouth every 8 (eight) hours as needed for nausea or vomiting. Patient not taking: Reported on 08/29/2021 05/08/20   Long, Wonda Olds, MD  phenazopyridine (PYRIDIUM) 200 MG tablet Take 1 tablet (200 mg total) by mouth 3 (three) times daily as needed for pain. 04/02/21   Volney American,  PA-C  prochlorperazine (COMPAZINE) 25 MG suppository Place 1 suppository (25 mg total) rectally every 12 (twelve) hours as needed for nausea or vomiting. 07/16/21   Willia Craze, NP  senna-docusate (SENOKOT-S) 8.6-50 MG tablet Take 1 tablet by mouth at bedtime as needed for mild constipation or moderate constipation. 02/11/21   Long, Wonda Olds, MD  triamcinolone cream (KENALOG) 0.1 % Apply 1 application topically 2 (two) times daily. 04/19/20   Daleen Squibb, MD    Current Outpatient Medications  Medication Sig Dispense Refill   amLODipine (NORVASC) 5 MG tablet TAKE 1 TABLET BY MOUTH EVERY DAY 90 tablet 0   dapagliflozin propanediol (FARXIGA) 10 MG TABS tablet Take 1 tablet (10 mg total) by mouth daily. 90 tablet 3  dicyclomine (BENTYL) 20 MG tablet Take 1 tablet (20 mg total) by mouth 3 (three) times daily as needed for spasms (abdominal cramping). 20 tablet 0   diphenoxylate-atropine (LOMOTIL) 2.5-0.025 MG tablet Take 1 tablet by mouth 4 (four) times daily as needed for diarrhea or loose stools. 60 tablet 0   DULoxetine (CYMBALTA) 60 MG capsule TAKE 1 CAPSULE BY MOUTH EVERY DAY 90 capsule 1   escitalopram (LEXAPRO) 10 MG tablet Take 10 mg by mouth daily.     gabapentin (NEURONTIN) 300 MG capsule Take 300 mg by mouth at bedtime.     glucose blood (ONETOUCH VERIO) test strip Check blood glucose 3-4 x day. Dx E11.9, E11.649, z79.4, 100 each 12   insulin aspart (NOVOLOG FLEXPEN) 100 UNIT/ML FlexPen Max Daily 50 units 30 mL 11   Insulin Glargine (BASAGLAR KWIKPEN) 100 UNIT/ML Inject 30 Units into the skin at bedtime. (Patient taking differently: Inject 17 Units into the skin at bedtime.) 30 mL 4   Insulin Pen Needle 32G X 4 MM MISC 1 Device by Does not apply route in the morning, at noon, in the evening, and at bedtime. 400 each 3   Lancets (ONETOUCH DELICA PLUS [id number]) MISC 1 each by Does not apply route 3 (three) times daily. Dx E11.65, z79.4 100 each 11   lisinopril (ZESTRIL) 10 MG  tablet Take 10 mg by mouth daily.     montelukast (SINGULAIR) 10 MG tablet TAKE 1 TABLET BY MOUTH EVERYDAY AT BEDTIME 90 tablet 1   Multiple Vitamins-Minerals (WOMENS 50+ MULTI VITAMIN/MIN PO) Take by mouth.     rosuvastatin (CRESTOR) 40 MG tablet 1 tablet     saccharomyces boulardii (FLORASTOR) 250 MG capsule See admin instructions.     albuterol (VENTOLIN HFA) 108 (90 Base) MCG/ACT inhaler Inhale 2 puffs into the lungs every 6 (six) hours as needed for wheezing or shortness of breath. 18 g 3   chlorzoxazone (PARAFON) 500 MG tablet Take 500 mg by mouth 3 (three) times daily as needed.     diazepam (VALIUM) 5 MG tablet SMARTSIG:1 Tablet(s) By Mouth     esomeprazole (NEXIUM) 40 MG capsule TAKE 1 CAPSULE BY MOUTH EVERY DAY BEFORE BREAKFAST 30 capsule 11   fluticasone (FLONASE) 50 MCG/ACT nasal spray SPRAY 2 SPRAYS INTO EACH NOSTRIL EVERY DAY (Patient taking differently: Patient takes as needed) 48 mL 5   lidocaine (LIDODERM) 5 % Place 1 patch onto the skin daily. Remove & Discard patch within 12 hours or as directed by MD 30 patch 0   lidocaine (XYLOCAINE) 2 % solution Use as directed 15 mLs in the mouth or throat as needed for mouth pain. 200 mL 0   mesalamine (LIALDA) 1.2 g EC tablet Take 2 tablets (2.4 g total) by mouth 2 (two) times daily. 360 tablet 3   methocarbamol (ROBAXIN) 500 MG tablet Take 1 tablet (500 mg total) by mouth every 8 (eight) hours as needed for muscle spasms. 20 tablet 0   metoCLOPramide (REGLAN) 10 MG tablet Take 1 tablet (10 mg total) by mouth 4 (four) times daily. 3 tablet 0   NONFORMULARY OR COMPOUNDED ITEM Swish and spit 5 mLs as needed (ulcer flare up in mouth). Magic mouthwash with lidocaine disp#150 ml 150 each 1   ondansetron (ZOFRAN ODT) 4 MG disintegrating tablet Take 1 tablet (4 mg total) by mouth every 8 (eight) hours as needed for nausea or vomiting. (Patient not taking: Reported on 08/29/2021) 20 tablet 0   phenazopyridine (PYRIDIUM) 200 MG tablet Take  1 tablet  (200 mg total) by mouth 3 (three) times daily as needed for pain. 12 tablet 0   prochlorperazine (COMPAZINE) 25 MG suppository Place 1 suppository (25 mg total) rectally every 12 (twelve) hours as needed for nausea or vomiting. 30 suppository 2   senna-docusate (SENOKOT-S) 8.6-50 MG tablet Take 1 tablet by mouth at bedtime as needed for mild constipation or moderate constipation. 20 tablet 0   triamcinolone cream (KENALOG) 0.1 % Apply 1 application topically 2 (two) times daily. 30 g 2   Current Facility-Administered Medications  Medication Dose Route Frequency Provider Last Rate Last Admin   0.9 %  sodium chloride infusion  500 mL Intravenous Once Kaylen Motl, Lajuan Lines, MD        Allergies as of 08/29/2021 - Review Complete 08/29/2021  Allergen Reaction Noted   Sulfa antibiotics Hives 11/07/2020   Sulfamethoxazole-trimethoprim Hives and Itching 11/07/2020   Codeine Itching, Rash, and Hives 07/07/2016   Latex Hives 02/06/2017    Family History  Problem Relation Age of Onset   Stroke Mother    Diabetes Mother    Hypertension Mother    Lung cancer Mother    Sickle cell trait Mother    Sickle cell trait Father    Sickle cell anemia Sister 32   Sickle cell anemia Sister    Other Sister        accident and blood clot formed   Diabetes Maternal Aunt    Lung cancer Maternal Aunt    Lung cancer Maternal Aunt    Lung cancer Maternal Aunt    Lung cancer Maternal Grandmother    Colon cancer Cousin 30   Pancreatic cancer Neg Hx     Social History   Socioeconomic History   Marital status: Married    Spouse name: Simona Huh   Number of children: 4   Years of education: Not on file   Highest education level: Not on file  Occupational History   Occupation: retired    Comment: He formerly worked for [employer], disabled due to a fall  Tobacco Use   Smoking status: Former    Types: Cigarettes    Quit date: 10/13/2006    Years since quitting: 14.8   Smokeless tobacco:  Never  Vaping Use   Vaping Use: Never used  Substance and Sexual Activity   Alcohol use: Yes    Comment: occasional   Drug use: No   Sexual activity: Yes    Partners: Male    Comment: 1ST intercourse- 4, partners- 37, married- 21 yrs   Other Topics Concern   Not on file  Social History Narrative   She is married, 3 sons born 1979, 1981, 1984.  One daughter born in 22.   She is medically retired from the [employer] after a fall and a hip injury, around 2004.   Caffeinated beverage every other day x1   07/13/2017   Social Determinants of Health   Financial Resource Strain: Not on file  Food Insecurity: Not on file  Transportation Needs: Not on file  Physical Activity: Not on file  Stress: Not on file  Social Connections: Not on file  Intimate Partner Violence: Not on file    Physical Exam: Vital signs in last 24 hours: @BP  (!) 137/108    Pulse 76    Temp (!) 96.8 F (36 C)    Ht 5' 2"  (1.575 m)    Wt 158 lb (71.7 kg)  SpO2 99%    BMI 28.90 kg/m  GEN: NAD EYE: Sclerae anicteric ENT: MMM CV: Non-tachycardic Pulm: CTA b/l GI: Soft, NT/ND NEURO:  Alert & Oriented x 3   Zenovia Jarred, MD [facility]  08/29/2021 2:01 PM

## 2021-08-29 NOTE — Progress Notes (Signed)
Pt having some abdominal cramping.  2 Levsin given SL.

## 2021-09-03 ENCOUNTER — Telehealth: Payer: Self-pay | Admitting: *Deleted

## 2021-09-03 NOTE — Telephone Encounter (Signed)
°  Follow up Call-  Call back number 08/29/2021 08/31/2019  Post procedure Call Back phone  # [phone]  Permission to leave phone message Yes Yes  Some recent data might be hidden     Patient questions:  Do you have a fever, pain , or abdominal swelling? No. Pain Score  0 *  Have you tolerated food without any problems? Yes.    Have you been able to return to your normal activities? Yes.    Do you have any questions about your discharge instructions: Diet   No. Medications  No. Follow up visit  No.  Do you have questions or concerns about your Care? No.  Actions: * If pain score is 4 or above: No action needed, pain <4.  Have you developed a fever since your procedure? no  2.   Have you had an respiratory symptoms (SOB or cough) since your procedure? no  3.   Have you tested positive for COVID 19 since your procedure no  4.   Have you had any family members/close contacts diagnosed with the COVID 19 since your procedure?  no   If yes to any of these questions please route to Joylene John, RN and Joella Prince, RN

## 2021-09-05 ENCOUNTER — Emergency Department (HOSPITAL_COMMUNITY)
Admission: EM | Admit: 2021-09-05 | Discharge: 2021-09-05 | Disposition: A | Discharge status: Home / Self Care | Payer: Medicare Other | Attending: Emergency Medicine | Admitting: Emergency Medicine

## 2021-09-05 ENCOUNTER — Emergency Department (HOSPITAL_COMMUNITY): Payer: Medicare Other

## 2021-09-05 ENCOUNTER — Other Ambulatory Visit: Payer: Self-pay

## 2021-09-05 ENCOUNTER — Encounter (HOSPITAL_COMMUNITY): Payer: Self-pay

## 2021-09-05 DIAGNOSIS — Z794 Long term (current) use of insulin: Secondary | ICD-10-CM | POA: Insufficient documentation

## 2021-09-05 DIAGNOSIS — G43909 Migraine, unspecified, not intractable, without status migrainosus: Secondary | ICD-10-CM | POA: Insufficient documentation

## 2021-09-05 DIAGNOSIS — Z87891 Personal history of nicotine dependence: Secondary | ICD-10-CM | POA: Insufficient documentation

## 2021-09-05 DIAGNOSIS — E1110 Type 2 diabetes mellitus with ketoacidosis without coma: Secondary | ICD-10-CM | POA: Insufficient documentation

## 2021-09-05 DIAGNOSIS — Z79899 Other long term (current) drug therapy: Secondary | ICD-10-CM | POA: Insufficient documentation

## 2021-09-05 DIAGNOSIS — Z86011 Personal history of benign neoplasm of the brain: Secondary | ICD-10-CM | POA: Insufficient documentation

## 2021-09-05 DIAGNOSIS — Z91040 Latex allergy status: Secondary | ICD-10-CM | POA: Diagnosis not present

## 2021-09-05 DIAGNOSIS — I10 Essential (primary) hypertension: Secondary | ICD-10-CM | POA: Insufficient documentation

## 2021-09-05 DIAGNOSIS — R519 Headache, unspecified: Secondary | ICD-10-CM | POA: Diagnosis not present

## 2021-09-05 DIAGNOSIS — Z20822 Contact with and (suspected) exposure to covid-19: Secondary | ICD-10-CM | POA: Insufficient documentation

## 2021-09-05 DIAGNOSIS — I6523 Occlusion and stenosis of bilateral carotid arteries: Secondary | ICD-10-CM | POA: Diagnosis not present

## 2021-09-05 DIAGNOSIS — Z7984 Long term (current) use of oral hypoglycemic drugs: Secondary | ICD-10-CM | POA: Insufficient documentation

## 2021-09-05 LAB — CBC WITH DIFFERENTIAL/PLATELET
Abs Immature Granulocytes: 0.04 10*3/uL (ref 0.00–0.07)
Basophils Absolute: 0 10*3/uL (ref 0.0–0.1)
Basophils Relative: 0 %
Eosinophils Absolute: 0.3 10*3/uL (ref 0.0–0.5)
Eosinophils Relative: 2 %
HCT: 38.4 % (ref 36.0–46.0)
Hemoglobin: 12.5 g/dL (ref 12.0–15.0)
Immature Granulocytes: 0 %
Lymphocytes Relative: 22 %
Lymphs Abs: 2.8 10*3/uL (ref 0.7–4.0)
MCH: 28.5 pg (ref 26.0–34.0)
MCHC: 32.6 g/dL (ref 30.0–36.0)
MCV: 87.7 fL (ref 80.0–100.0)
Monocytes Absolute: 0.8 10*3/uL (ref 0.1–1.0)
Monocytes Relative: 6 %
Neutro Abs: 8.5 10*3/uL — ABNORMAL HIGH (ref 1.7–7.7)
Neutrophils Relative %: 70 %
Platelets: 326 10*3/uL (ref 150–400)
RBC: 4.38 MIL/uL (ref 3.87–5.11)
RDW: 13 % (ref 11.5–15.5)
WBC: 12.4 10*3/uL — ABNORMAL HIGH (ref 4.0–10.5)
nRBC: 0 % (ref 0.0–0.2)

## 2021-09-05 LAB — RESP PANEL BY RT-PCR (FLU A&B, COVID) ARPGX2
Influenza A by PCR: NEGATIVE
Influenza B by PCR: NEGATIVE
SARS Coronavirus 2 by RT PCR: NEGATIVE

## 2021-09-05 LAB — BASIC METABOLIC PANEL
Anion gap: 4 — ABNORMAL LOW (ref 5–15)
BUN: 13 mg/dL (ref 6–20)
CO2: 26 mmol/L (ref 22–32)
Calcium: 8.8 mg/dL — ABNORMAL LOW (ref 8.9–10.3)
Chloride: 107 mmol/L (ref 98–111)
Creatinine, Ser: 0.86 mg/dL (ref 0.44–1.00)
GFR, Estimated: >60 mL/min (ref >60)
Glucose, Bld: 138 mg/dL — ABNORMAL HIGH (ref 70–99)
Potassium: 4.1 mmol/L (ref 3.5–5.1)
Sodium: 137 mmol/L (ref 135–145)

## 2021-09-05 LAB — SEDIMENTATION RATE: Sed Rate: 20 mm/hr (ref 0–22)

## 2021-09-05 IMAGING — CT CT HEAD W/O CM
3 series · 16 of 47 positions shown, 19 images · non-contrast
Comparison: Head CT [DATE].

CLINICAL DATA: 60-year-old female with sudden onset of headache
with nausea and vomiting for 1 week.

EXAM:
CT HEAD WITHOUT CONTRAST
TECHNIQUE: Contiguous axial images were obtained from the base of the skull
through the vertex without intravenous contrast.

[Series 3: head wo · axial · 0.47mm/px · z∈[-120,+5]mm · 10 of 30 slices shown, 13 images]
[im 3/30  brain]
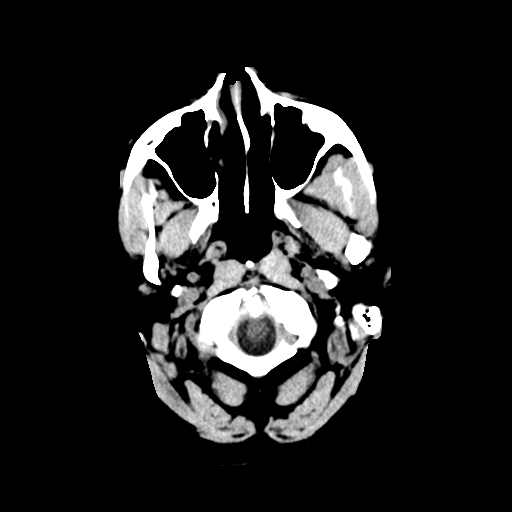
[im 3/30  bone]
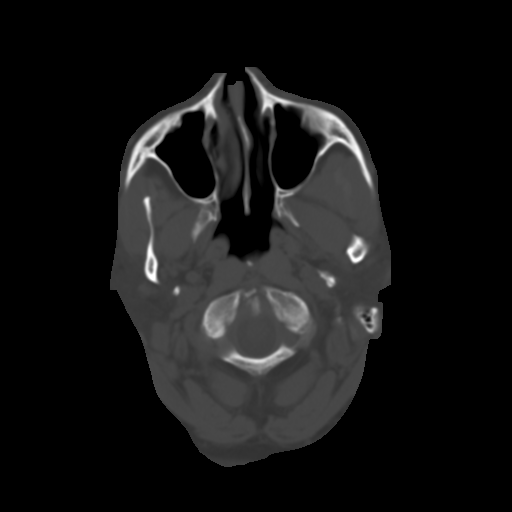
[im 6/30  brain]
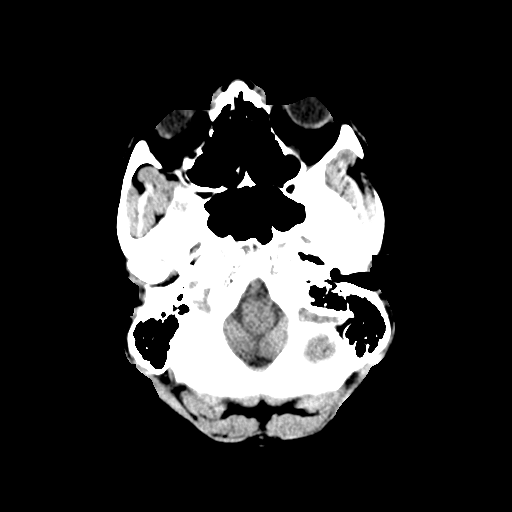
[im 9/30  brain]
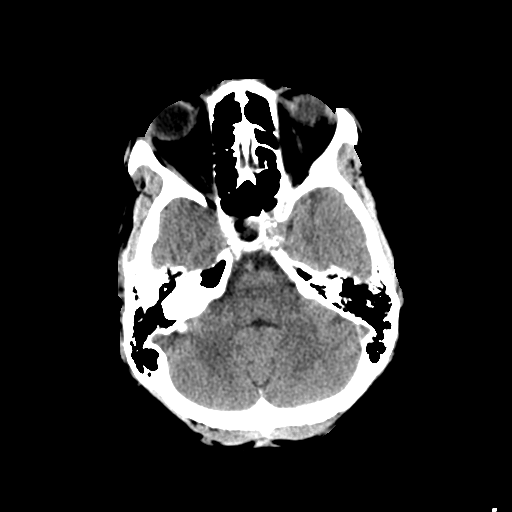
[im 11/30  brain]
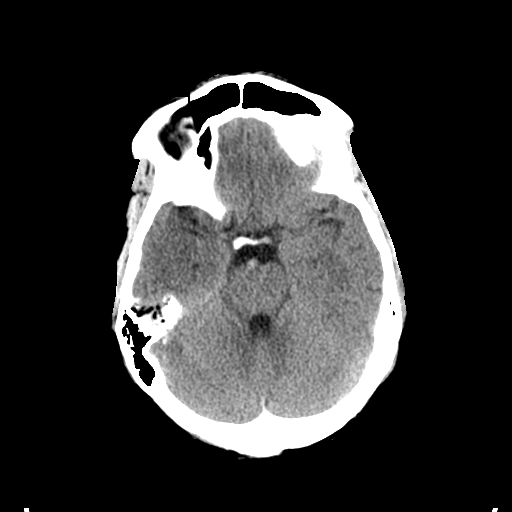
[im 14/30  brain]
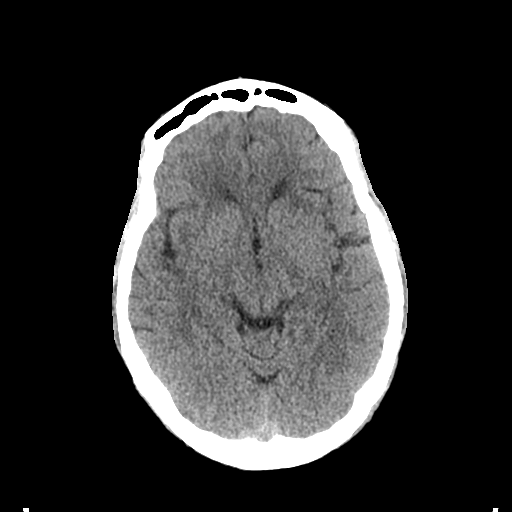
[im 14/30  bone]
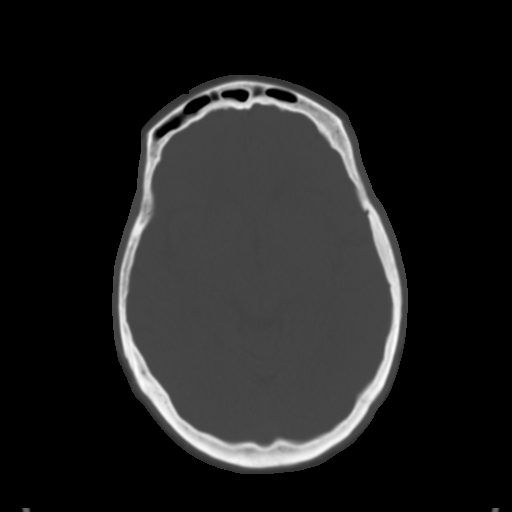
[im 17/30  brain]
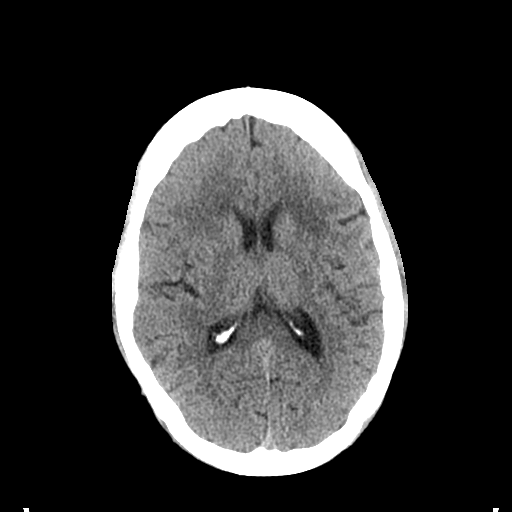
[im 20/30  brain]
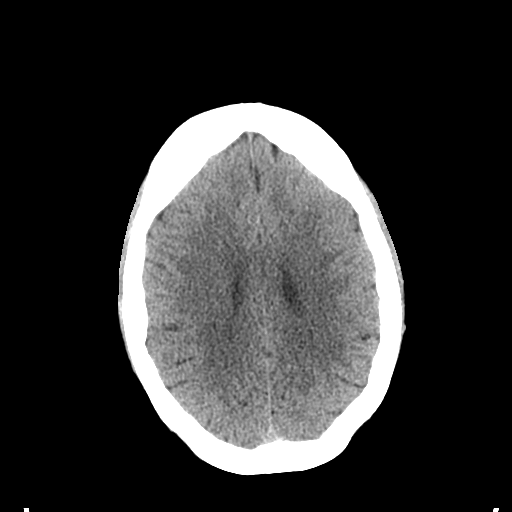
[im 23/30  brain]
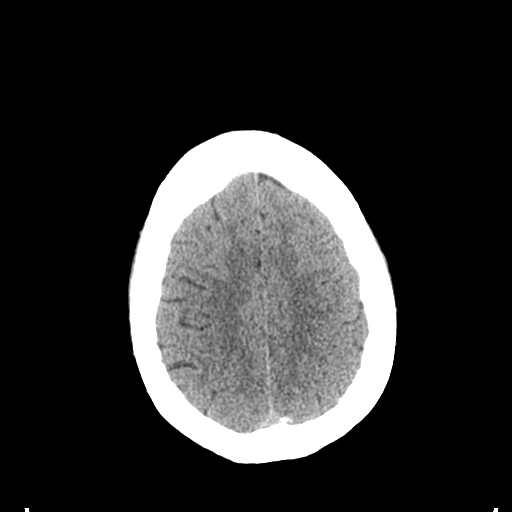
[im 25/30  brain]
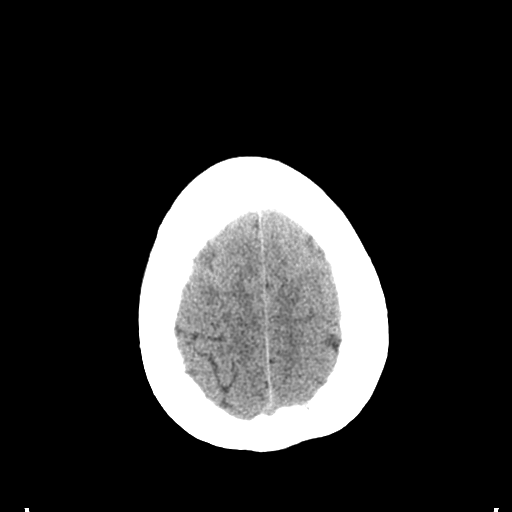
[im 25/30  bone]
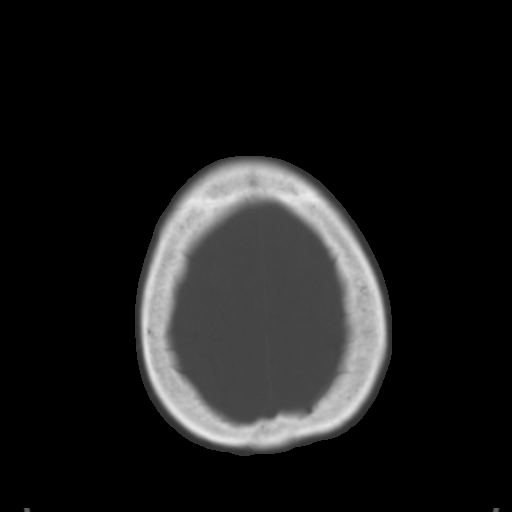
[im 28/30  brain]
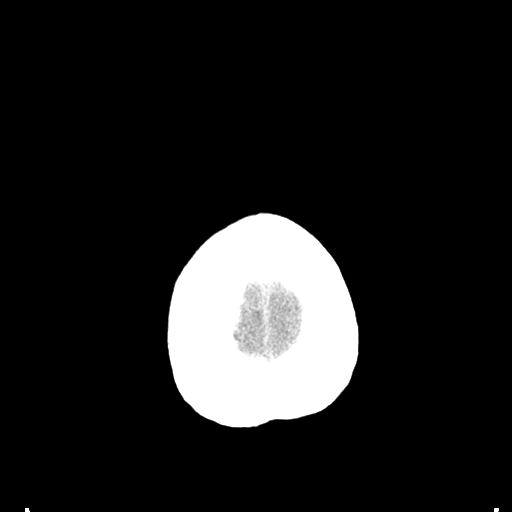

[Series 4: coronal soft tissue · coronal · 0.29mm/px · 3 of 65 slices shown]
[im 22/65  brain]
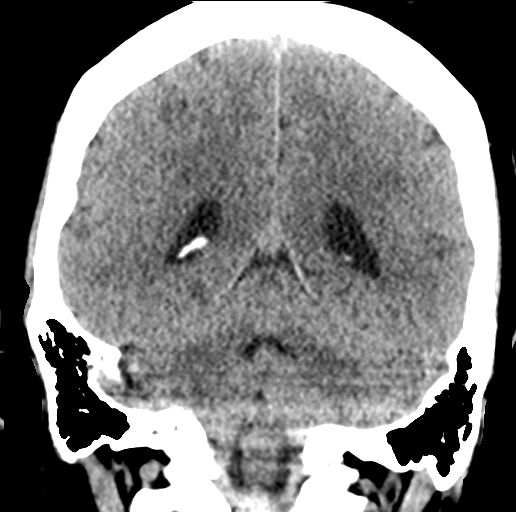
[im 29/65  brain]
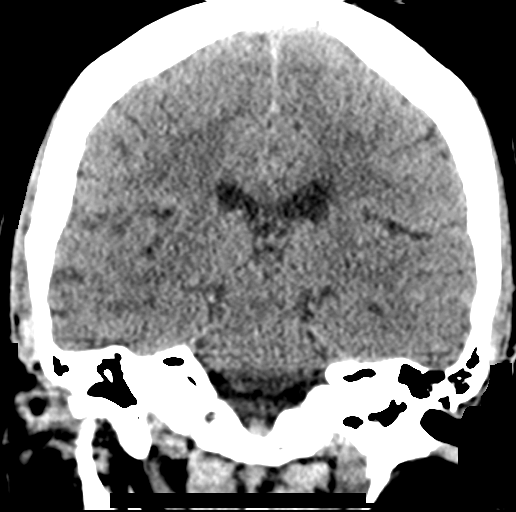
[im 36/65  brain]
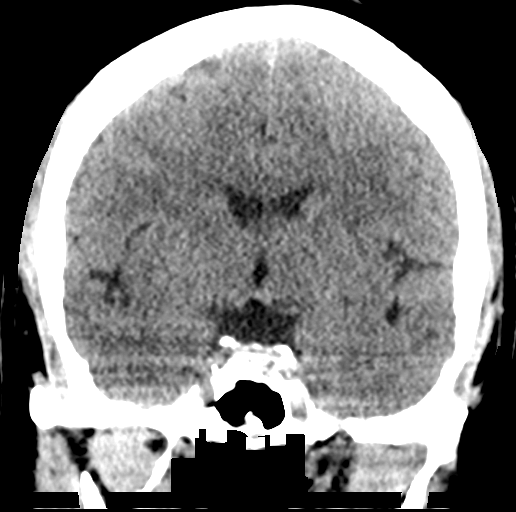

[Series 5: sagittal soft tissue · sagittal · 0.31mm/px · 3 of 51 slices shown]
[im 17/51  brain]
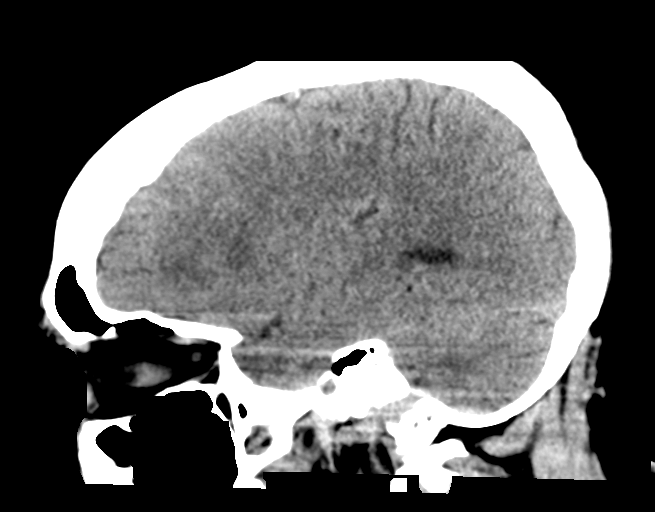
[im 26/51  brain]
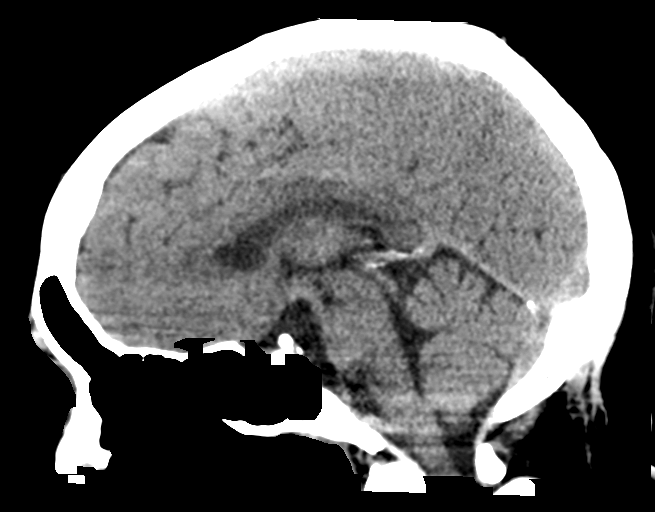
[im 34/51  brain]
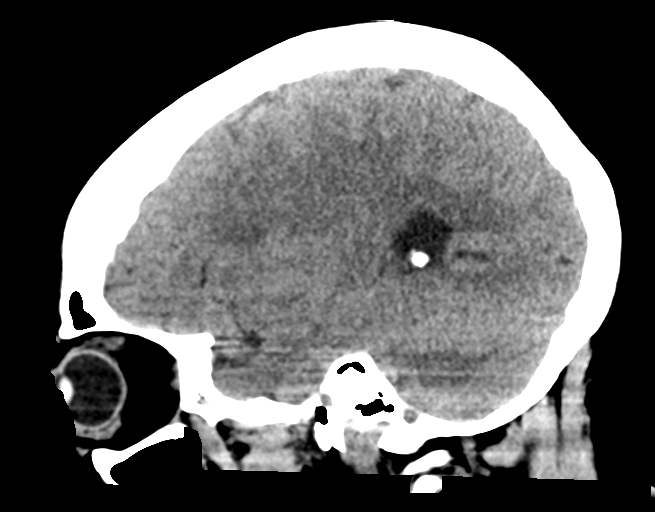

[16 of 47 positions shown; findings below may reference images not displayed]

FINDINGS: Brain: Patchy and confluent areas of decreased attenuation are noted
throughout the deep and periventricular white matter of the cerebral
hemispheres bilaterally, compatible with chronic microvascular
ischemic disease. No evidence of acute infarction, hemorrhage,
hydrocephalus, extra-axial collection or mass lesion/mass effect.

Vascular: No hyperdense vessel or unexpected calcification.

Skull: Normal. Negative for fracture or focal lesion.

Sinuses/Orbits: No acute finding.

Other: None.
IMPRESSION: 1. No acute intracranial abnormalities.
2. Mild chronic microvascular ischemic changes in the cerebral white
matter, as above.

## 2021-09-05 IMAGING — CT CT ANGIO HEAD
2 of 7 series · 8 of 33 positions shown · IV contrast (OMNIPAQUE 350)
Comparison: None.

CLINICAL DATA: Headache, sudden, severe; Vertebral artery
dissection suspected

EXAM:
CT ANGIOGRAPHY HEAD AND NECK
TECHNIQUE: Multidetector CT imaging of the head and neck was performed using
the standard protocol during bolus administration of intravenous
contrast. Multiplanar CT image reconstructions and MIPs were
obtained to evaluate the vascular anatomy. Carotid stenosis
measurements (when applicable) are obtained utilizing NASCET
criteria, using the distal internal carotid diameter as the
denominator.
CONTRAST:  75mL OMNIPAQUE IOHEXOL 350 MG/ML SOLN

[Series 5: cta head neck · axial · 0.51mm/px · z∈[-250,-128]mm · 2 of 184 slices shown]
[im 62/184  soft-tissue]
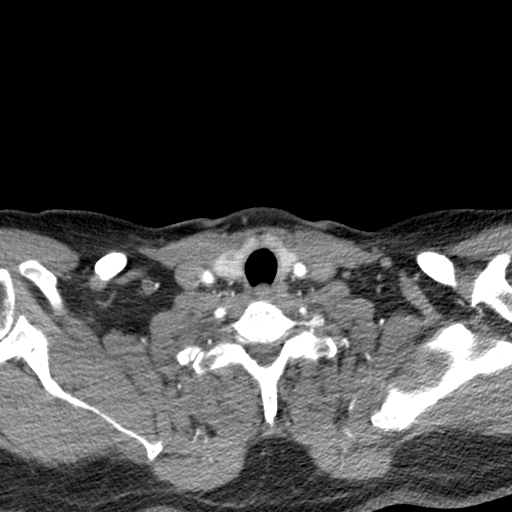
[im 123/184  soft-tissue]
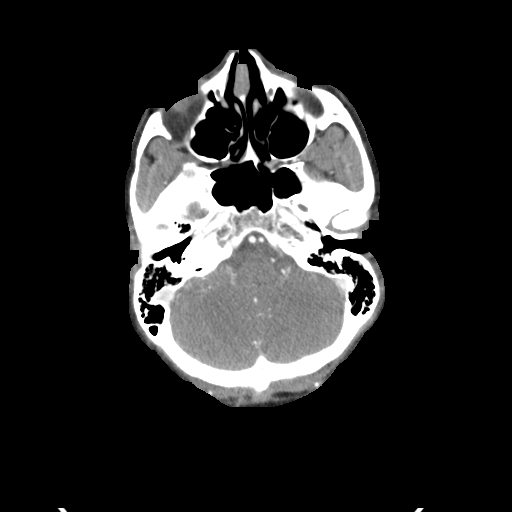

[Series 7: ax thin · axial · 0.39mm/px · z∈[-321,-61]mm · 6 of 364 slices shown]
[im 52/364  soft-tissue]
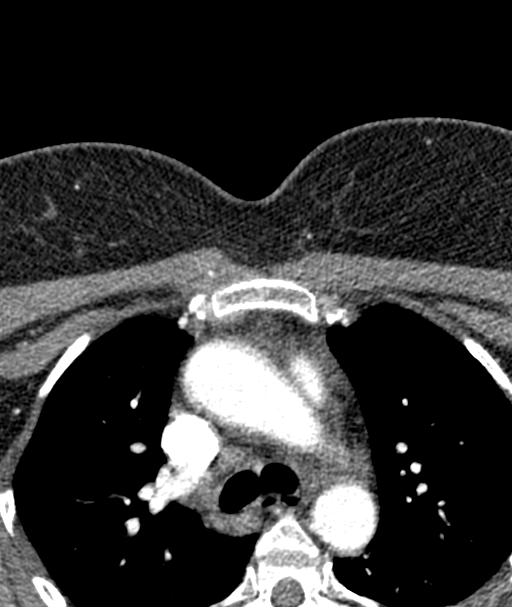
[im 104/364  bone]
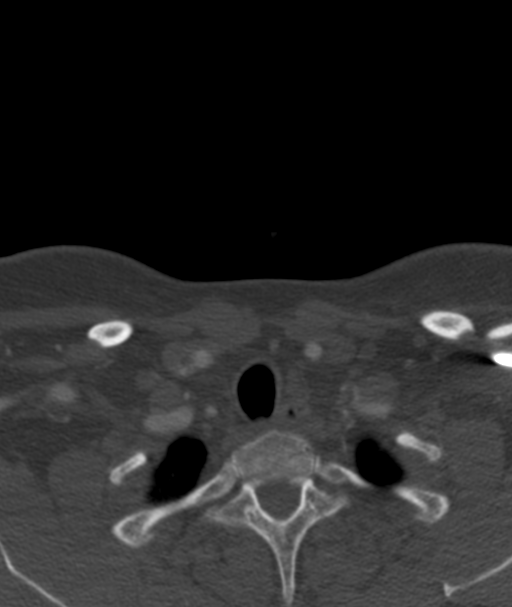
[im 156/364  soft-tissue]
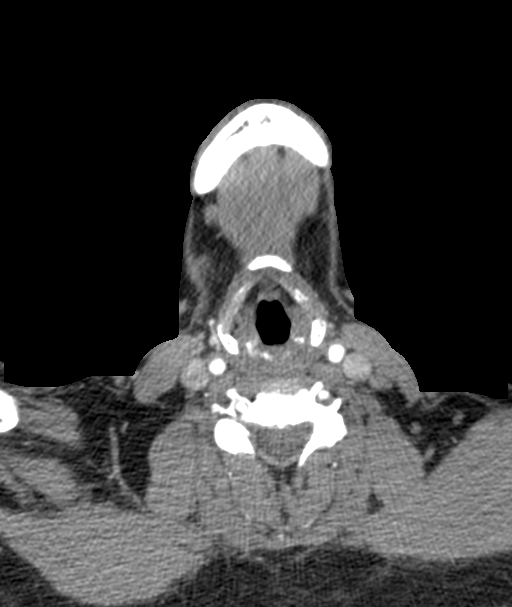
[im 208/364  bone]
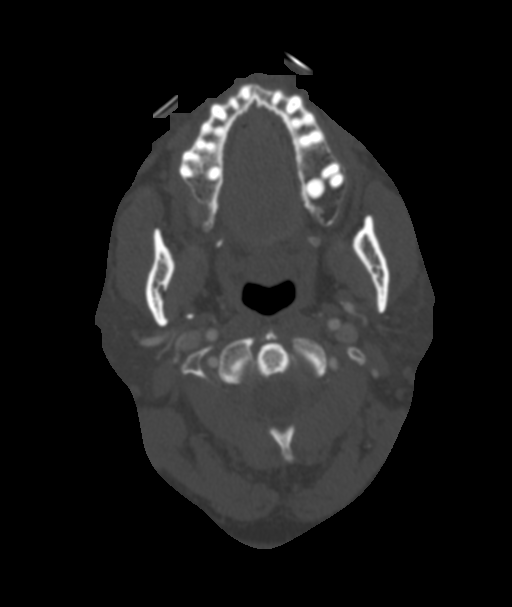
[im 260/364  soft-tissue]
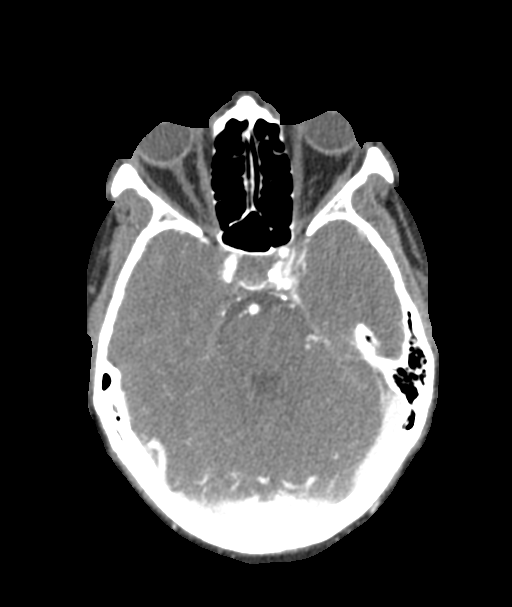
[im 312/364  bone]
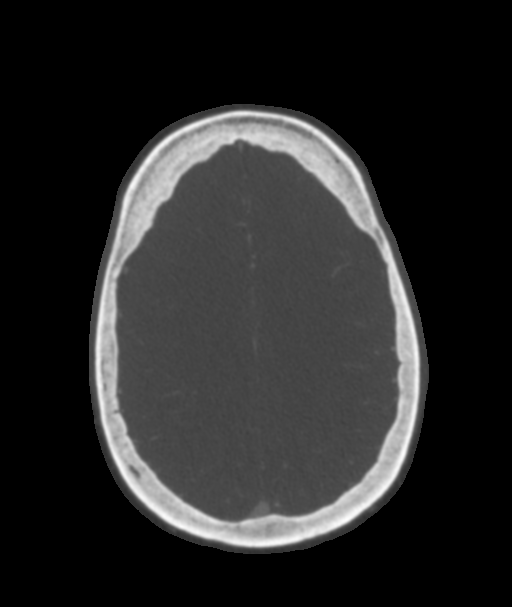

[8 of 33 positions shown; findings below may reference images not displayed]

FINDINGS: CTA NECK

Aortic arch: Great vessel origins are patent.

Right carotid system: Patent. Mild atherosclerotic wall thickening
along the common carotid. No stenosis.

Left carotid system: Patent. Mild atherosclerotic wall thickening
along the carotid. No stenosis.

Vertebral arteries: Patent. Right vertebral is mildly dominant.
Trace calcified plaque at the right vertebral origin. No evidence of
dissection.

Skeleton: Mild degenerative changes the cervical spine.

Other neck: Subcentimeter thyroid nodules. No ultrasound follow-up
is recommended by current guidelines.

Upper chest: No apical lung mass.

Review of the MIP images confirms the above findings

CTA HEAD

Anterior circulation: Intracranial internal carotid arteries are
patent with mild calcified plaque. Anterior and middle cerebral
arteries are patent.

Posterior circulation: Intracranial vertebral arteries are patent.
Basilar artery is patent. Major cerebellar artery origins are
patent. Posterior cerebral arteries are patent.

Venous sinuses: Patent as allowed by contrast bolus timing.

Review of the MIP images confirms the above findings
IMPRESSION: No large vessel occlusion, hemodynamically significant stenosis, or
evidence of dissection.

## 2021-09-05 IMAGING — CT CT ANGIO NECK
2 of 7 series · 8 of 33 positions shown · IV contrast (OMNIPAQUE 350)
Comparison: None.

CLINICAL DATA: Headache, sudden, severe; Vertebral artery
dissection suspected

EXAM:
CT ANGIOGRAPHY HEAD AND NECK
TECHNIQUE: Multidetector CT imaging of the head and neck was performed using
the standard protocol during bolus administration of intravenous
contrast. Multiplanar CT image reconstructions and MIPs were
obtained to evaluate the vascular anatomy. Carotid stenosis
measurements (when applicable) are obtained utilizing NASCET
criteria, using the distal internal carotid diameter as the
denominator.
CONTRAST:  75mL OMNIPAQUE IOHEXOL 350 MG/ML SOLN

[Series 5: cta head neck · axial · 0.51mm/px · z∈[-250,-128]mm · 2 of 184 slices shown]
[im 62/184  soft-tissue]
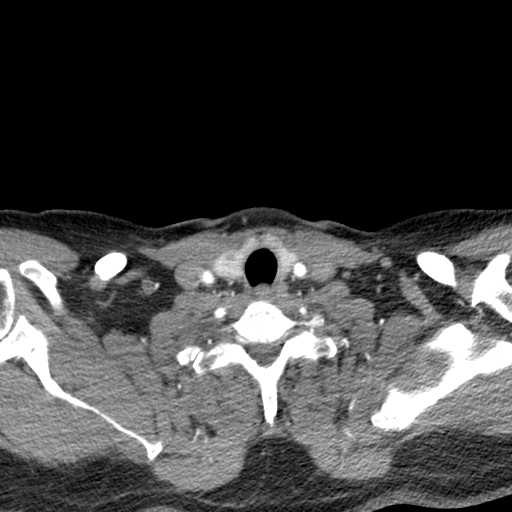
[im 123/184  soft-tissue]
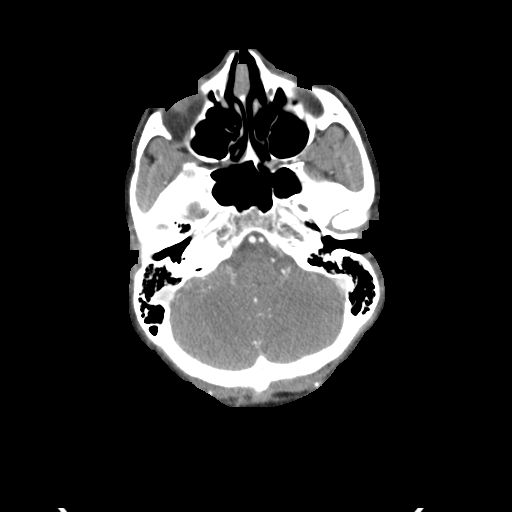

[Series 7: ax thin · axial · 0.39mm/px · z∈[-321,-61]mm · 6 of 364 slices shown]
[im 52/364  soft-tissue]
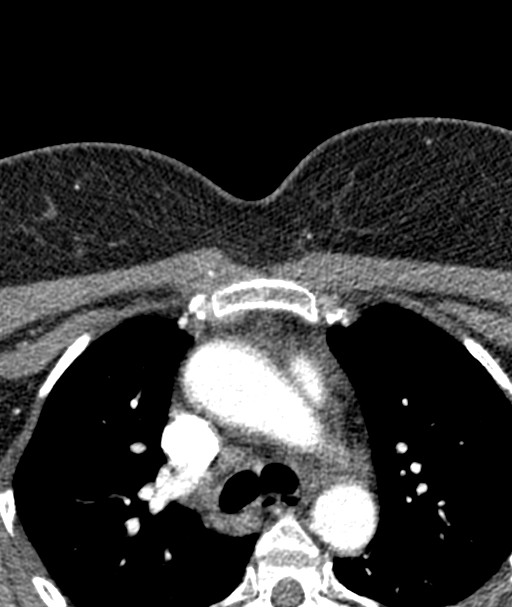
[im 104/364  bone]
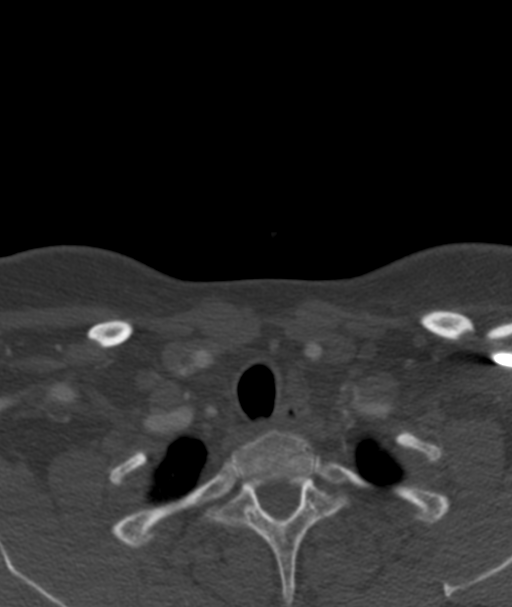
[im 156/364  soft-tissue]
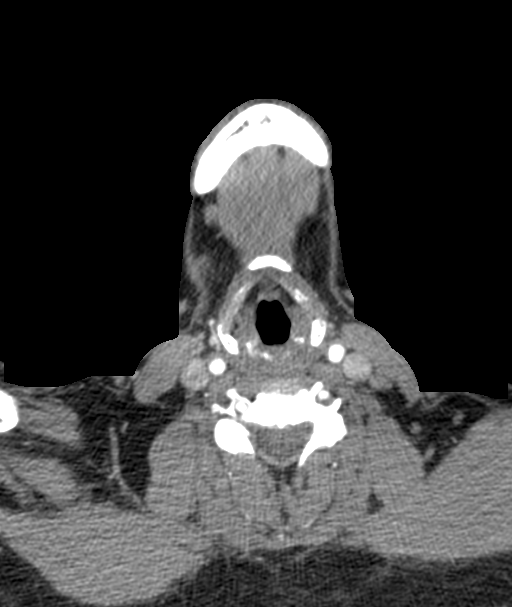
[im 208/364  bone]
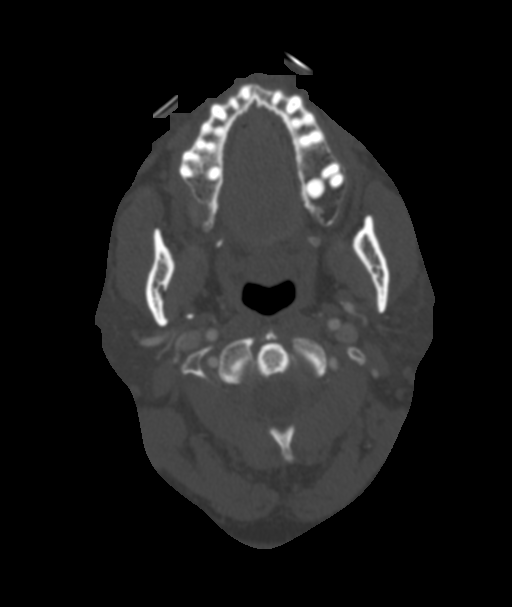
[im 260/364  soft-tissue]
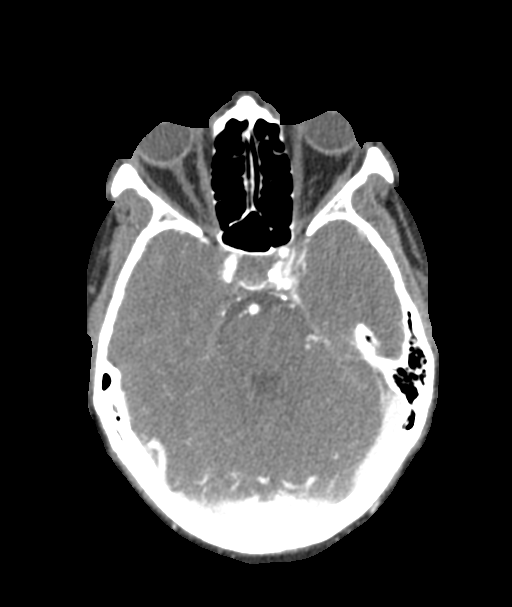
[im 312/364  bone]
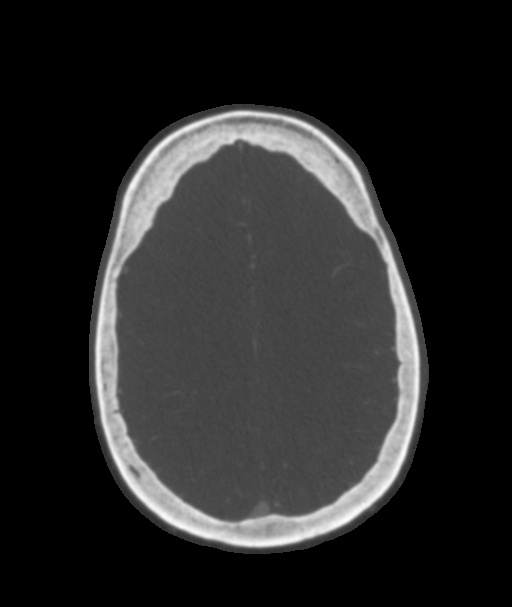

[8 of 33 positions shown; findings below may reference images not displayed]

FINDINGS: CTA NECK

Aortic arch: Great vessel origins are patent.

Right carotid system: Patent. Mild atherosclerotic wall thickening
along the common carotid. No stenosis.

Left carotid system: Patent. Mild atherosclerotic wall thickening
along the carotid. No stenosis.

Vertebral arteries: Patent. Right vertebral is mildly dominant.
Trace calcified plaque at the right vertebral origin. No evidence of
dissection.

Skeleton: Mild degenerative changes the cervical spine.

Other neck: Subcentimeter thyroid nodules. No ultrasound follow-up
is recommended by current guidelines.

Upper chest: No apical lung mass.

Review of the MIP images confirms the above findings

CTA HEAD

Anterior circulation: Intracranial internal carotid arteries are
patent with mild calcified plaque. Anterior and middle cerebral
arteries are patent.

Posterior circulation: Intracranial vertebral arteries are patent.
Basilar artery is patent. Major cerebellar artery origins are
patent. Posterior cerebral arteries are patent.

Venous sinuses: Patent as allowed by contrast bolus timing.

Review of the MIP images confirms the above findings
IMPRESSION: No large vessel occlusion, hemodynamically significant stenosis, or
evidence of dissection.

## 2021-09-05 MED ORDER — DIPHENHYDRAMINE HCL 50 MG/ML IJ SOLN
12.5000 mg | Freq: Once | INTRAMUSCULAR | Status: AC
  Administered 2021-09-05: 09:00:00 12.5 mg via INTRAVENOUS
  Filled 2021-09-05: qty 1

## 2021-09-05 MED ORDER — LIDOCAINE-EPINEPHRINE (PF) 2 %-1:200000 IJ SOLN
20.0000 mL | Freq: Once | INTRAMUSCULAR | Status: AC
  Administered 2021-09-05: 20 mL via INTRADERMAL
  Filled 2021-09-05: qty 20

## 2021-09-05 MED ORDER — IOHEXOL 350 MG/ML SOLN
80.0000 mL | Freq: Once | INTRAVENOUS | Status: AC | PRN
  Administered 2021-09-05: 09:00:00 75 mL via INTRAVENOUS

## 2021-09-05 MED ORDER — METOCLOPRAMIDE HCL 5 MG/ML IJ SOLN
10.0000 mg | Freq: Once | INTRAMUSCULAR | Status: AC
  Administered 2021-09-05: 09:00:00 10 mg via INTRAVENOUS
  Filled 2021-09-05: qty 2

## 2021-09-05 MED ORDER — SODIUM CHLORIDE 0.9 % IV BOLUS
1000.0000 mL | Freq: Once | INTRAVENOUS | Status: AC
  Administered 2021-09-05: 08:00:00 1000 mL via INTRAVENOUS

## 2021-09-05 MED ORDER — SODIUM CHLORIDE (PF) 0.9 % IJ SOLN
INTRAMUSCULAR | Status: AC
  Filled 2021-09-05: qty 50

## 2021-09-05 MED ORDER — ACETAMINOPHEN 500 MG PO TABS
1000.0000 mg | ORAL_TABLET | Freq: Once | ORAL | Status: AC
  Administered 2021-09-05: 09:00:00 1000 mg via ORAL
  Filled 2021-09-05: qty 2

## 2021-09-05 MED ORDER — KETOROLAC TROMETHAMINE 15 MG/ML IJ SOLN
15.0000 mg | Freq: Once | INTRAMUSCULAR | Status: AC
  Administered 2021-09-05: 09:00:00 15 mg via INTRAVENOUS
  Filled 2021-09-05: qty 1

## 2021-09-05 MED ORDER — SODIUM CHLORIDE 0.9 % IV SOLN: INTRAVENOUS | Status: DC

## 2021-09-05 NOTE — Discharge Instructions (Addendum)
You were evaluated in the Emergency Department and after careful evaluation, we did not find any emergent condition requiring admission or further testing in the hospital.  Your exam/testing today was overall reassuring.  Your symptoms are consistent with migraine headache versus possible occipital neuralgia.  You have a reassuring and normal neurologic exam.  Your CT imaging of your head was negative.  Please return to the Emergency Department if you experience any worsening of your condition.  Thank you for allowing [place] to be a part of your care.
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
## 2021-09-05 NOTE — ED Notes (Signed)
Patient transported to CT 

## 2021-09-10 DIAGNOSIS — F410 Panic disorder [episodic paroxysmal anxiety] without agoraphobia: Secondary | ICD-10-CM | POA: Diagnosis not present

## 2021-09-10 DIAGNOSIS — I10 Essential (primary) hypertension: Secondary | ICD-10-CM | POA: Diagnosis not present

## 2021-09-10 DIAGNOSIS — E785 Hyperlipidemia, unspecified: Secondary | ICD-10-CM | POA: Diagnosis not present

## 2021-09-10 DIAGNOSIS — E1169 Type 2 diabetes mellitus with other specified complication: Secondary | ICD-10-CM | POA: Diagnosis not present

## 2021-09-11 ENCOUNTER — Encounter: Payer: Self-pay | Admitting: Internal Medicine

## 2021-09-17 ENCOUNTER — Telehealth: Payer: Self-pay | Admitting: Internal Medicine

## 2021-09-18 NOTE — Telephone Encounter (Signed)
NA

## 2021-09-20 DIAGNOSIS — I10 Essential (primary) hypertension: Secondary | ICD-10-CM | POA: Diagnosis not present

## 2021-09-20 DIAGNOSIS — Z0000 Encounter for general adult medical examination without abnormal findings: Secondary | ICD-10-CM | POA: Diagnosis not present

## 2021-09-20 DIAGNOSIS — E1169 Type 2 diabetes mellitus with other specified complication: Secondary | ICD-10-CM | POA: Diagnosis not present

## 2021-09-20 DIAGNOSIS — F410 Panic disorder [episodic paroxysmal anxiety] without agoraphobia: Secondary | ICD-10-CM | POA: Diagnosis not present

## 2021-09-20 DIAGNOSIS — G8929 Other chronic pain: Secondary | ICD-10-CM | POA: Diagnosis not present

## 2021-09-20 DIAGNOSIS — Z794 Long term (current) use of insulin: Secondary | ICD-10-CM | POA: Diagnosis not present

## 2021-09-20 DIAGNOSIS — Z23 Encounter for immunization: Secondary | ICD-10-CM | POA: Diagnosis not present

## 2021-09-20 DIAGNOSIS — K51019 Ulcerative (chronic) pancolitis with unspecified complications: Secondary | ICD-10-CM | POA: Diagnosis not present

## 2021-09-20 DIAGNOSIS — M62838 Other muscle spasm: Secondary | ICD-10-CM | POA: Diagnosis not present

## 2021-10-01 DIAGNOSIS — Z23 Encounter for immunization: Secondary | ICD-10-CM | POA: Diagnosis not present

## 2021-10-01 DIAGNOSIS — I10 Essential (primary) hypertension: Secondary | ICD-10-CM | POA: Diagnosis not present

## 2021-10-01 DIAGNOSIS — R052 Subacute cough: Secondary | ICD-10-CM | POA: Diagnosis not present

## 2021-10-01 DIAGNOSIS — E1169 Type 2 diabetes mellitus with other specified complication: Secondary | ICD-10-CM | POA: Diagnosis not present

## 2021-10-01 DIAGNOSIS — K2100 Gastro-esophageal reflux disease with esophagitis, without bleeding: Secondary | ICD-10-CM | POA: Diagnosis not present

## 2021-10-08 DIAGNOSIS — M542 Cervicalgia: Secondary | ICD-10-CM | POA: Diagnosis not present

## 2021-10-08 DIAGNOSIS — M5451 Vertebrogenic low back pain: Secondary | ICD-10-CM | POA: Diagnosis not present

## 2021-10-08 DIAGNOSIS — M5459 Other low back pain: Secondary | ICD-10-CM | POA: Diagnosis not present

## 2021-10-10 ENCOUNTER — Other Ambulatory Visit: Payer: Self-pay

## 2021-10-10 ENCOUNTER — Telehealth: Payer: Self-pay

## 2021-10-10 MED ORDER — FREESTYLE LIBRE 2 SENSOR MISC: 3 refills | Status: DC

## 2021-10-10 NOTE — Telephone Encounter (Signed)
Freestyle sensors sent to the pharmacy per patient request

## 2021-10-11 ENCOUNTER — Other Ambulatory Visit: Payer: Self-pay

## 2021-10-11 MED ORDER — FREESTYLE LIBRE 2 SENSOR MISC: 3 refills | Status: DC

## 2021-10-11 NOTE — Telephone Encounter (Signed)
Sensors have been sent to [facility] on [address]

## 2021-10-17 DIAGNOSIS — M5451 Vertebrogenic low back pain: Secondary | ICD-10-CM | POA: Diagnosis not present

## 2021-10-17 DIAGNOSIS — M5459 Other low back pain: Secondary | ICD-10-CM | POA: Diagnosis not present

## 2021-10-22 DIAGNOSIS — M5451 Vertebrogenic low back pain: Secondary | ICD-10-CM | POA: Diagnosis not present

## 2021-10-24 ENCOUNTER — Encounter (HOSPITAL_COMMUNITY): Payer: Self-pay

## 2021-10-24 ENCOUNTER — Other Ambulatory Visit: Payer: Self-pay

## 2021-10-24 ENCOUNTER — Ambulatory Visit (HOSPITAL_COMMUNITY)
Admission: EM | Admit: 2021-10-24 | Discharge: 2021-10-24 | Disposition: A | Discharge status: Home / Self Care | Payer: HMO | Attending: Family Medicine | Admitting: Family Medicine

## 2021-10-24 ENCOUNTER — Telehealth: Payer: Self-pay

## 2021-10-24 DIAGNOSIS — R220 Localized swelling, mass and lump, head: Secondary | ICD-10-CM

## 2021-10-24 DIAGNOSIS — T7840XA Allergy, unspecified, initial encounter: Secondary | ICD-10-CM | POA: Diagnosis not present

## 2021-10-24 MED ORDER — DEXAMETHASONE SODIUM PHOSPHATE 10 MG/ML IJ SOLN
10.0000 mg | Freq: Once | INTRAMUSCULAR | Status: AC
  Administered 2021-10-24: 10 mg via INTRAMUSCULAR

## 2021-10-24 MED ORDER — DEXAMETHASONE SODIUM PHOSPHATE 10 MG/ML IJ SOLN
INTRAMUSCULAR | Status: AC
  Filled 2021-10-24: qty 1

## 2021-10-24 MED ORDER — DIPHENHYDRAMINE HCL 50 MG/ML IJ SOLN
INTRAMUSCULAR | Status: AC
  Filled 2021-10-24: qty 1

## 2021-10-24 MED ORDER — PREDNISONE 20 MG PO TABS: 40.0000 mg | ORAL_TABLET | Freq: Every day | ORAL | 0 refills | Status: DC

## 2021-10-24 MED ORDER — DIPHENHYDRAMINE HCL 50 MG/ML IJ SOLN
50.0000 mg | Freq: Once | INTRAMUSCULAR | Status: AC
  Administered 2021-10-24: 50 mg via INTRAMUSCULAR

## 2021-10-24 NOTE — ED Notes (Signed)
Reports taking Benadryl and states it has not helped.

## 2021-10-24 NOTE — Discharge Instructions (Addendum)
Meds ordered this encounter  Medications   dexamethasone (DECADRON) injection 10 mg   diphenhydrAMINE (BENADRYL) injection 50 mg   predniSONE (DELTASONE) 20 MG tablet    Sig: Take 2 tablets (40 mg total) by mouth daily.    Dispense:  10 tablet    Refill:  0   Watch your blood sugars closely as the steroids will cause them to rise.

## 2021-10-24 NOTE — Telephone Encounter (Signed)
Patient has been placed on Prednisone for allergic reaction. Patient would like to know what changes she should make to medication so sugar won't get high.

## 2021-10-24 NOTE — Telephone Encounter (Signed)
Patient notified and verbalized understanding. 

## 2021-10-24 NOTE — ED Triage Notes (Signed)
Pt presents with facial swelling since this morning.   States it is hard to keep her eyes open.   Pt states her face itches and sore to touch.

## 2021-10-24 NOTE — ED Provider Notes (Signed)
[place]   [id number] 10/24/21 Arrival Time: 2633  ASSESSMENT & PLAN:  1. Facial swelling   2. Allergic reaction, initial encounter    Meds ordered this encounter  Medications   dexamethasone (DECADRON) injection 10 mg   diphenhydrAMINE (BENADRYL) injection 50 mg   predniSONE (DELTASONE) 20 MG tablet    Sig: Take 2 tablets (40 mg total) by mouth daily.    Dispense:  10 tablet    Refill:  0   Showing improvement. Normal respirations and swallowing. D/C to home. Husband is driving.   Follow-up Information     [facility].   Specialty: Emergency Medicine Why: If worsening or failing to improve as anticipated. Contact information: [address] [id number] [place] [address] [phone]                Reviewed expectations re: course of current medical issues. Questions answered. Outlined signs and symptoms indicating need for more acute intervention. Patient verbalized understanding. After Visit Summary given.   SUBJECTIVE: History from: patient. Jeanean Hollett is a 61 y.o. female who presents for evaluation of a possible allergic reaction. Possible trigger: unidentified. Reports facial swelling and itching. Onset abrupt, first noted  yest evening . Clinical course: stable. Respiratory symptoms:  none . Denies chest pain, dyspnea on exertion, dyspnea while laying down, shortness of breath, and wheezing. Patient reports similar previous allergic reactions in past; usually in spring. Patient denies exposure to new medications or allergens. Care prior to arrival consisted of Benadryl, with minimal relief. No swallowing difficulties.   OBJECTIVE:  Vitals:   10/24/21 0948  BP: 129/87  Pulse: 60  Resp: 17  TempSrc: Oral  SpO2: 99%    General appearance: alert; no distress Eyes: PERRLA; EOMI; conjunctiva normal HENT: normocephalic; atraumatic; TMs normal; nasal mucosa normal; oral  mucosa normal Neck: supple  Lungs: clear to auscultation bilaterally; speaks full sentences without difficulty Heart: regular Abdomen: soft, non-tender Back: no CVA tenderness Extremities: no cyanosis or edema; symmetrical with no gross deformities Skin: warm and dry; with diffuse facial swelling and mild erythema over cheeks and around eyes Neurologic: normal gait; normal symmetric reflexes Psychological: alert and cooperative; normal mood and affect   Allergies  Allergen Reactions   Sulfa Antibiotics Hives   Sulfamethoxazole-Trimethoprim Hives and Itching   Codeine Itching, Rash, Hives and Other (See Comments)   Latex Hives    With the power    Past Medical History:  Diagnosis Date   Anemia    Anxiety    Carpal tunnel syndrome    bilateral   Chronic heel pain    Diabetes mellitus without complication (HCC)    DJD (degenerative joint disease) of cervical spine    MRI 2017   Hyperlipidemia    Hypertension    Plantar fasciitis    bilateral   Sickle cell anemia (HCC)    sickle cell trait   Sleep apnea    Tendonitis    left hand/wrist   UC (ulcerative colitis) ([place])    Social History   Socioeconomic History   Marital status: Married    Spouse name: Simona Huh   Number of children: 4   Years of education: Not on file   Highest education level: Not on file  Occupational History   Occupation: retired    Comment: He formerly worked for [employer] of [employer], disabled due to a fall  Tobacco Use   Smoking status: Former    Types:  Cigarettes    Quit date: 10/13/2006    Years since quitting: 15.0   Smokeless tobacco: Never  Vaping Use   Vaping Use: Never used  Substance and Sexual Activity   Alcohol use: Yes    Comment: occasional   Drug use: No   Sexual activity: Yes    Partners: Male    Comment: 1ST intercourse- 89, partners- 67, married- 11 yrs   Other Topics Concern   Not on file  Social History Narrative   She is married, 3 sons born 1979, 1981,  1984.  One daughter born in 27.   She is medically retired from the [employer] after a fall and a hip injury, around 2004.   Caffeinated beverage every other day x1   07/13/2017   Social Determinants of Health   Financial Resource Strain: Not on file  Food Insecurity: Not on file  Transportation Needs: Not on file  Physical Activity: Not on file  Stress: Not on file  Social Connections: Not on file  Intimate Partner Violence: Not on file   Family History  Problem Relation Age of Onset   Stroke Mother    Diabetes Mother    Hypertension Mother    Lung cancer Mother    Sickle cell trait Mother    Sickle cell trait Father    Sickle cell anemia Sister 27   Sickle cell anemia Sister    Other Sister        accident and blood clot formed   Diabetes Maternal Aunt    Lung cancer Maternal Aunt    Lung cancer Maternal Aunt    Lung cancer Maternal Aunt    Lung cancer Maternal Grandmother    Colon cancer Cousin 30   Pancreatic cancer Neg Hx    Past Surgical History:  Procedure Laterality Date   ABDOMINAL HYSTERECTOMY     APPENDECTOMY     CESAREAN SECTION     x4   COLONOSCOPY     Multiple in [place]   ESOPHAGOGASTRODUODENOSCOPY       Vanessa Kick, MD 10/24/21 [phone]

## 2021-10-29 DIAGNOSIS — E1169 Type 2 diabetes mellitus with other specified complication: Secondary | ICD-10-CM | POA: Diagnosis not present

## 2021-10-29 DIAGNOSIS — E785 Hyperlipidemia, unspecified: Secondary | ICD-10-CM | POA: Diagnosis not present

## 2021-10-29 DIAGNOSIS — J301 Allergic rhinitis due to pollen: Secondary | ICD-10-CM | POA: Diagnosis not present

## 2021-10-29 DIAGNOSIS — R519 Headache, unspecified: Secondary | ICD-10-CM | POA: Diagnosis not present

## 2021-12-11 DIAGNOSIS — U071 COVID-19: Secondary | ICD-10-CM | POA: Diagnosis not present

## 2021-12-11 DIAGNOSIS — R6889 Other general symptoms and signs: Secondary | ICD-10-CM | POA: Diagnosis not present

## 2021-12-20 ENCOUNTER — Emergency Department (HOSPITAL_COMMUNITY): Payer: HMO

## 2021-12-20 ENCOUNTER — Emergency Department (HOSPITAL_COMMUNITY)
Admission: EM | Admit: 2021-12-20 | Discharge: 2021-12-20 | Disposition: A | Discharge status: Home / Self Care | Payer: HMO | Attending: Emergency Medicine | Admitting: Emergency Medicine

## 2021-12-20 ENCOUNTER — Other Ambulatory Visit: Payer: Self-pay

## 2021-12-20 ENCOUNTER — Encounter (HOSPITAL_COMMUNITY): Payer: Self-pay

## 2021-12-20 DIAGNOSIS — R911 Solitary pulmonary nodule: Secondary | ICD-10-CM | POA: Diagnosis not present

## 2021-12-20 DIAGNOSIS — Z79899 Other long term (current) drug therapy: Secondary | ICD-10-CM | POA: Diagnosis not present

## 2021-12-20 DIAGNOSIS — R7989 Other specified abnormal findings of blood chemistry: Secondary | ICD-10-CM | POA: Diagnosis not present

## 2021-12-20 DIAGNOSIS — M79602 Pain in left arm: Secondary | ICD-10-CM | POA: Insufficient documentation

## 2021-12-20 DIAGNOSIS — R051 Acute cough: Secondary | ICD-10-CM

## 2021-12-20 DIAGNOSIS — M79601 Pain in right arm: Secondary | ICD-10-CM | POA: Diagnosis not present

## 2021-12-20 DIAGNOSIS — R059 Cough, unspecified: Secondary | ICD-10-CM | POA: Insufficient documentation

## 2021-12-20 DIAGNOSIS — R0600 Dyspnea, unspecified: Secondary | ICD-10-CM | POA: Diagnosis not present

## 2021-12-20 DIAGNOSIS — E1169 Type 2 diabetes mellitus with other specified complication: Secondary | ICD-10-CM | POA: Diagnosis not present

## 2021-12-20 DIAGNOSIS — Z8616 Personal history of COVID-19: Secondary | ICD-10-CM | POA: Diagnosis not present

## 2021-12-20 DIAGNOSIS — Z91040 Latex allergy status: Secondary | ICD-10-CM | POA: Insufficient documentation

## 2021-12-20 LAB — BASIC METABOLIC PANEL
Anion gap: 7 (ref 5–15)
BUN: 11 mg/dL (ref 6–20)
CO2: 27 mmol/L (ref 22–32)
Calcium: 9.1 mg/dL (ref 8.9–10.3)
Chloride: 104 mmol/L (ref 98–111)
Creatinine, Ser: 0.76 mg/dL (ref 0.44–1.00)
GFR, Estimated: >60 mL/min (ref >60)
Glucose, Bld: 103 mg/dL — ABNORMAL HIGH (ref 70–99)
Potassium: 4.4 mmol/L (ref 3.5–5.1)
Sodium: 138 mmol/L (ref 135–145)

## 2021-12-20 LAB — CBC
HCT: 38.1 % (ref 36.0–46.0)
Hemoglobin: 12.7 g/dL (ref 12.0–15.0)
MCH: 28.5 pg (ref 26.0–34.0)
MCHC: 33.3 g/dL (ref 30.0–36.0)
MCV: 85.4 fL (ref 80.0–100.0)
Platelets: 443 10*3/uL — ABNORMAL HIGH (ref 150–400)
RBC: 4.46 MIL/uL (ref 3.87–5.11)
RDW: 12.1 % (ref 11.5–15.5)
WBC: 13.3 10*3/uL — ABNORMAL HIGH (ref 4.0–10.5)
nRBC: 0 % (ref 0.0–0.2)

## 2021-12-20 LAB — D-DIMER, QUANTITATIVE: D-Dimer, Quant: 0.61 ug/mL-FEU — ABNORMAL HIGH (ref 0.00–0.50)

## 2021-12-20 IMAGING — CT CT ANGIO CHEST
2 of 6 series · 17 of 46 positions shown · IV contrast (agent unspecified)
Comparison: [DATE]

CLINICAL DATA: Pulmonary embolism suspected, positive D-dimer.

EXAM:
CT ANGIOGRAPHY CHEST WITH CONTRAST
TECHNIQUE: Multidetector CT imaging of the chest was performed using the
standard protocol during bolus administration of intravenous
contrast. Multiplanar CT image reconstructions and MIPs were
obtained to evaluate the vascular anatomy.

[Series 6: thins · axial · 0.69mm/px · z∈[+1072,+1302]mm · 14 of 252 slices shown]
[im 11/252  lung]
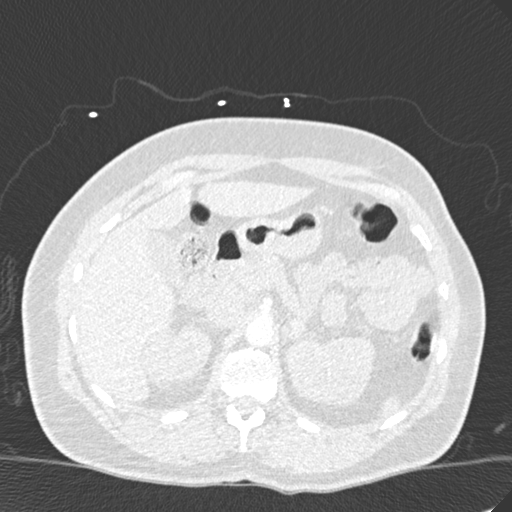
[im 33/252  soft-tissue]
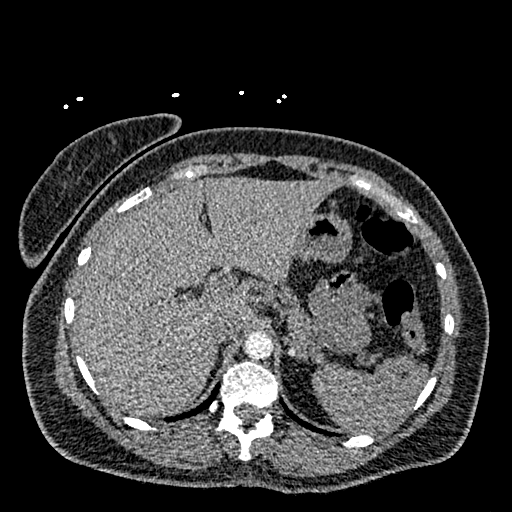
[im 44/252  lung]
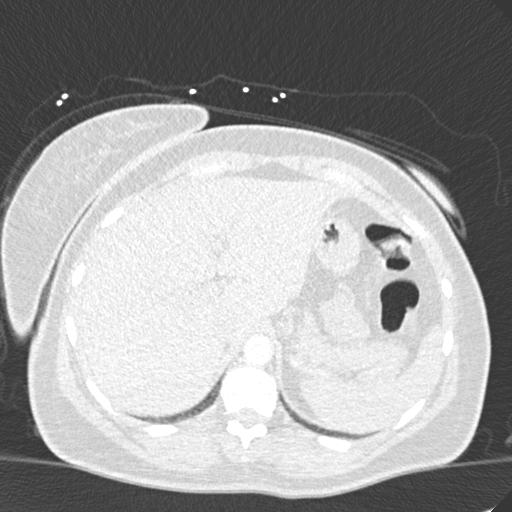
[im 66/252  soft-tissue]
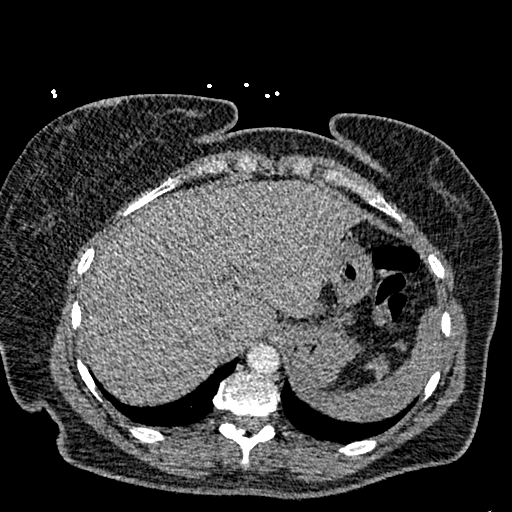
[im 88/252  lung]
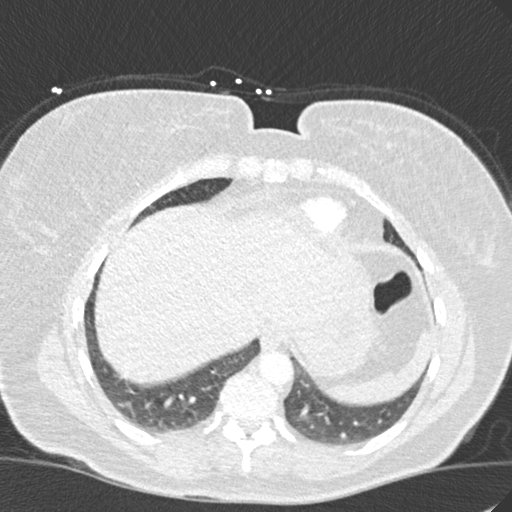
[im 99/252  soft-tissue]
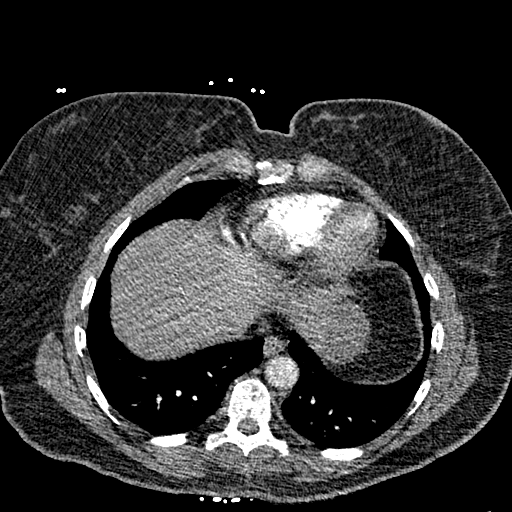
[im 121/252  lung]
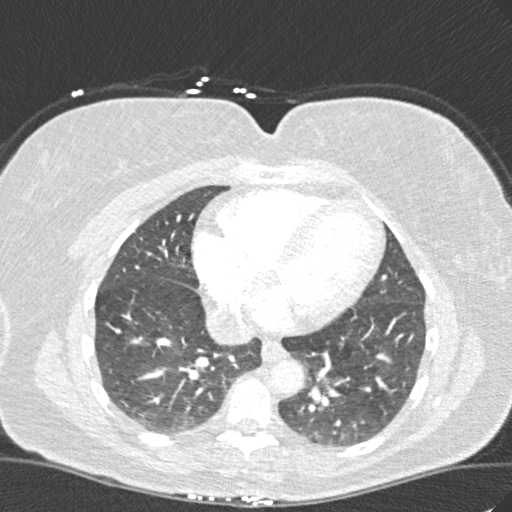
[im 131/252  soft-tissue]
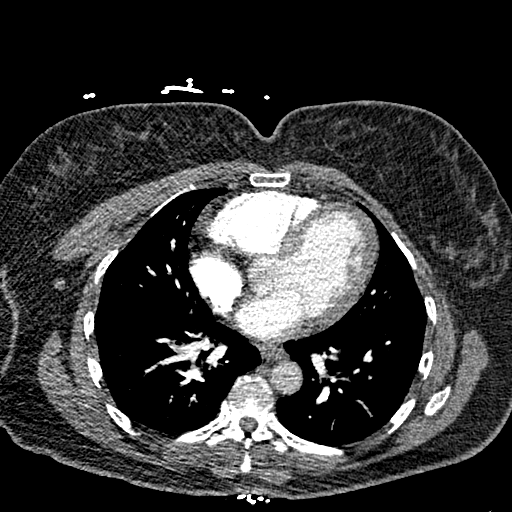
[im 153/252  lung]
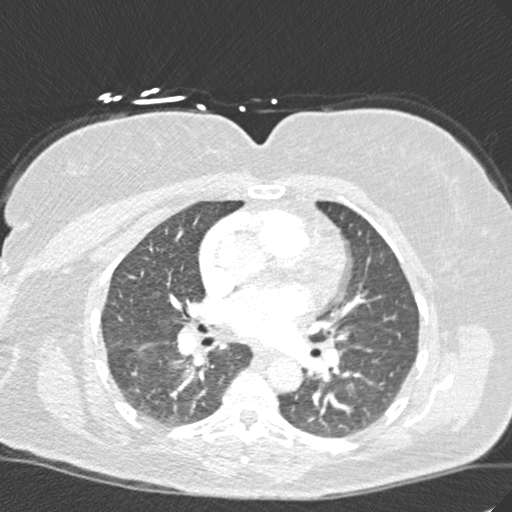
[im 164/252  soft-tissue]
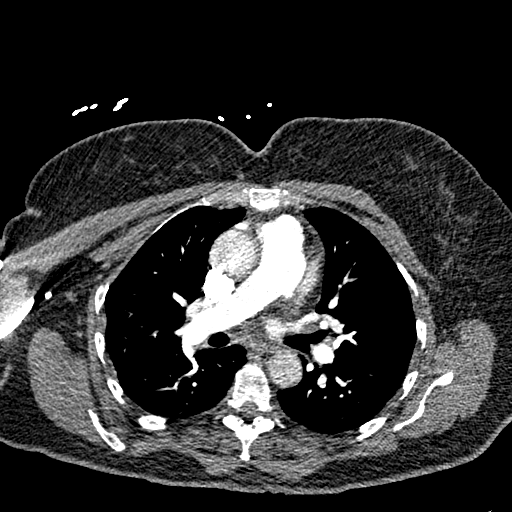
[im 186/252  lung]
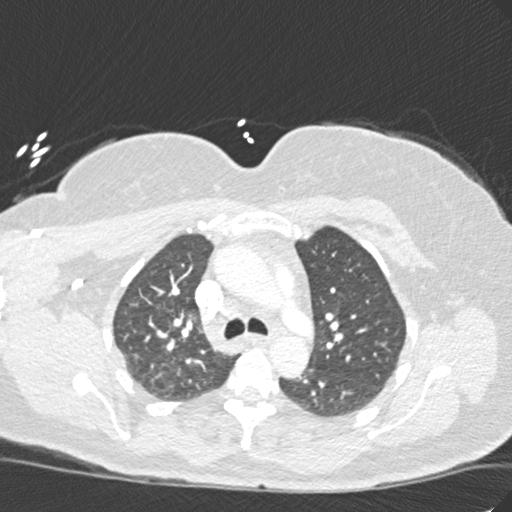
[im 208/252  soft-tissue]
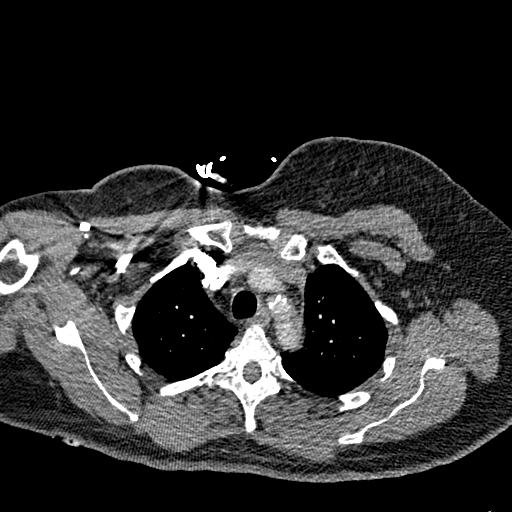
[im 219/252  lung]
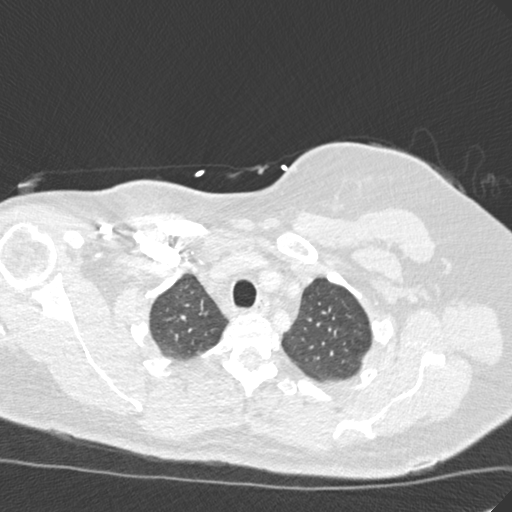
[im 241/252  soft-tissue]
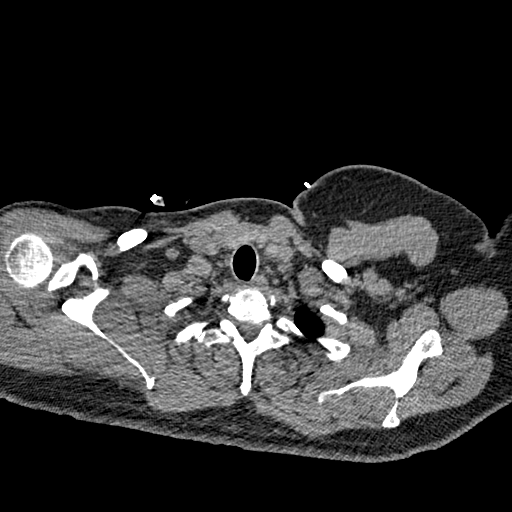

[Series 8: coronal mpr · coronal · 0.50mm/px · 3 of 151 slices shown]
[im 38/151  soft-tissue]
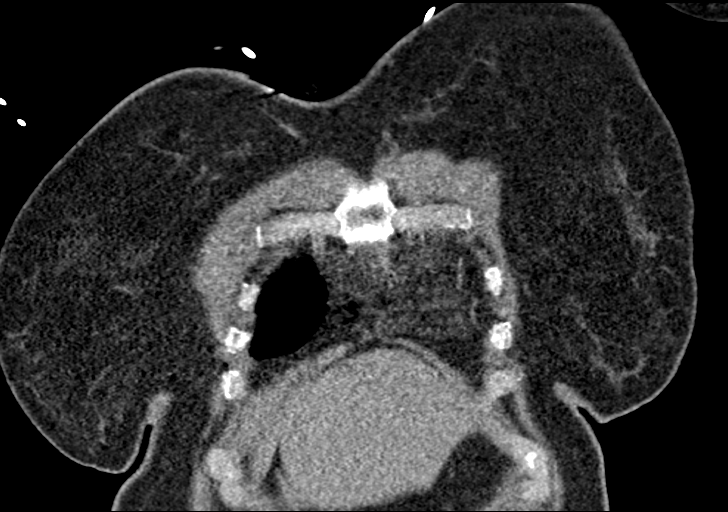
[im 76/151  soft-tissue]
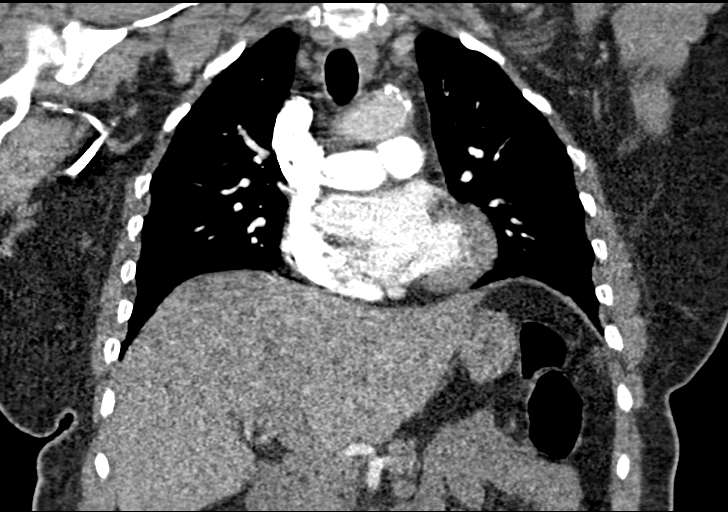
[im 113/151  soft-tissue]
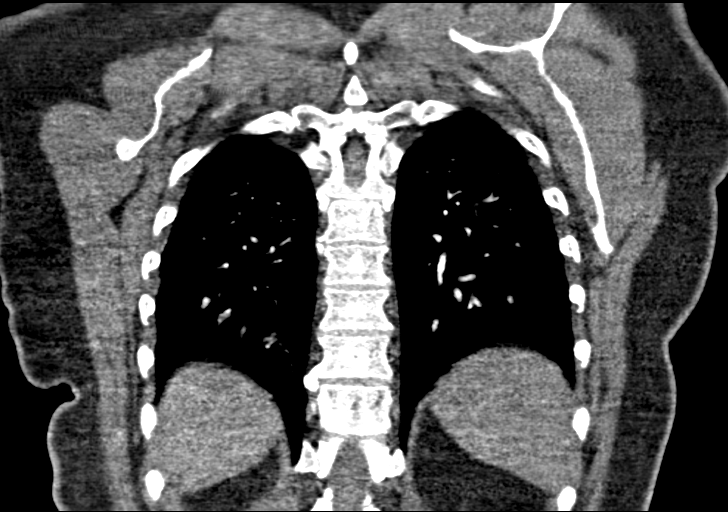

[17 of 46 positions shown; findings below may reference images not displayed]

RADIATION DOSE REDUCTION: This exam was performed according to the
departmental dose-optimization program which includes automated
exposure control, adjustment of the mA and/or kV according to
patient size and/or use of iterative reconstruction technique.

CONTRAST:  64mL OMNIPAQUE IOHEXOL 350 MG/ML SOLN
FINDINGS: Cardiovascular: The heart is enlarged and there is no pericardial
effusion. There is mild atherosclerotic calcification of the aorta
without evidence of aneurysm. Pulmonary trunk is normal in caliber.
No pulmonary artery filling defect is identified.

Mediastinum/Nodes: Shotty lymph nodes are present in the mediastinum
and hilar regions bilaterally. No axillary lymphadenopathy. The
thyroid gland, trachea, and esophagus are within normal limits.

Lungs/Pleura: No consolidation, effusion, or pneumothorax. There is
a nodular density in the right lower lobe measuring 6 mm, axial
image 32, unchanged from the prior exam.

Upper Abdomen: No acute abnormality.

Musculoskeletal: Mild degenerative changes are present in the
thoracic spine. No acute osseous abnormality.

Review of the MIP images confirms the above findings.
IMPRESSION: 1. No evidence of pulmonary embolism.
2. No acute process in the chest.
3. Aortic atherosclerosis.
4. Stable 6 mm nodule in the right lower lobe. Non-contrast chest CT
at 6-12 months is recommended. If the nodule is stable at time of
repeat CT, then future CT at 18-24 months (from today's scan) is
considered optional for low-risk patients, but is recommended for
high-risk patients. This recommendation follows the consensus
statement: Guidelines for Management of Incidental Pulmonary Nodules
Detected on CT Images: From the [HOSPITAL] [EW]; Radiology

## 2021-12-20 MED ORDER — IOHEXOL 350 MG/ML SOLN
75.0000 mL | Freq: Once | INTRAVENOUS | Status: AC | PRN
  Administered 2021-12-20: 64 mL via INTRAVENOUS

## 2021-12-20 NOTE — ED Triage Notes (Signed)
Pt arrives to ED POV c/o RT arm pain due to possible blood clot. Pt was sent from Dr. Fara Olden office after getting blood work results indicating possible blood clot.  ?

## 2021-12-20 NOTE — ED Notes (Signed)
Pt ambulated to bathroom 

## 2021-12-20 NOTE — ED Provider Triage Note (Signed)
Emergency Medicine Provider Triage Evaluation Note ? ?Beverly Ramirez , a 61 y.o. female  was evaluated in triage.  Pt complains of chest pain, shortness of breath, and right arm pain.  The chest pain or shortness of breath has been going on since patient had COVID in 4/5.  She was seen at her PCP today and she was complaining of right arm pain, as well as the chest symptoms.  Her PCP got a D-dimer, which apparently was elevated.  The [facility] physicians, and unable to see the result.  They called her and told her to come to the ER immediately. ? ?Review of Systems  ?Positive: Chest pain made worse with deep breaths, shortness of breath, cough, right arm pain ?Negative: Fever, chills ? ?Physical Exam  ?BP (!) 169/96 (BP Location: Left Arm)   Pulse 71   Temp 98.9 ?F (37.2 ?C) (Oral)   Resp 20   Ht 5' 2"  (1.575 m)   Wt 73 kg   SpO2 100%   BMI 29.45 kg/m?  ?Gen:   Awake, no distress   ?Resp:  Normal effort  ?MSK:   Moves extremities without difficulty  ?Other:   ? ?Medical Decision Making  ?Medically screening exam initiated at 7:05 PM.  Appropriate orders placed.  Jamyra Shetley was informed that the remainder of the evaluation will be completed by another provider, this initial triage assessment does not replace that evaluation, and the importance of remaining in the ED until their evaluation is complete. ? ? ?  ?Kateri Plummer, PA-C ?12/20/21 1905 ? ?

## 2021-12-20 NOTE — Discharge Instructions (Addendum)
You will need to have a repeat ct scan in 6-12 months.  Your physicain will need to schedule this ?

## 2021-12-20 NOTE — ED Provider Notes (Signed)
?[facility] ?Provider Note ? ? ?CSN: [id number] ?Arrival date & time: 12/20/21  1659 ? ?  ? ?History ? ?Chief Complaint  ?Patient presents with  ? Abnormal Lab  ? Arm Pain  ?  Right arm  ? ? ?Beverly Ramirez is a 61 y.o. female. ? ?Pt reports she began having pain in her left arm at the site where she placed a continuous glucose monitoring libre.  Patient reports that she took it off and her arm felt better she waited a few days and placed 1 on her right arm and then she began having pain in her right arm.  Patient reports she has continued to have a cough since she was diagnosed with COVID on 4 5.  Patient went to her primary care doctor's office today because of persistent cough and arm pain.  Her provider did a D-dimer which was elevated.  Patient was sent here for evaluation due to concern of pulmonary embolus due to recent COVID infection.  Patient denies any current shortness of breath his cough is nonproductive ? ?The history is provided by the patient. No language interpreter was used.  ?Abnormal Lab ?Arm Pain ?Pertinent negatives include no chest pain.  ? ?  ? ?Home Medications ?Prior to Admission medications   ?Medication Sig Start Date End Date Taking? Authorizing Provider  ?albuterol (VENTOLIN HFA) 108 (90 Base) MCG/ACT inhaler Inhale 2 puffs into the lungs every 6 (six) hours as needed for wheezing or shortness of breath. 03/13/20   Jacelyn Pi, Lilia Argue, MD  ?amLODipine (NORVASC) 5 MG tablet TAKE 1 TABLET BY MOUTH EVERY DAY 06/08/20   Jacelyn Pi, Lilia Argue, MD  ?chlorzoxazone (PARAFON) 500 MG tablet Take 500 mg by mouth 3 (three) times daily as needed. 08/12/21   [provider]  ?Continuous Blood Gluc Sensor (FREESTYLE LIBRE 2 SENSOR) MISC Change every 14 days 10/11/21   Shamleffer, Melanie Crazier, MD  ?dapagliflozin propanediol (FARXIGA) 10 MG TABS tablet Take 1 tablet (10 mg total) by mouth daily. 10/26/20   Shamleffer, Melanie Crazier, MD  ?diazepam (VALIUM) 5  MG tablet SMARTSIG:1 Tablet(s) By Mouth 08/12/21   [provider]  ?dicyclomine (BENTYL) 20 MG tablet Take 1 tablet (20 mg total) by mouth 3 (three) times daily as needed for spasms (abdominal cramping). 10/18/20   Wendie Agreste, MD  ?diphenoxylate-atropine (LOMOTIL) 2.5-0.025 MG tablet Take 1 tablet by mouth 4 (four) times daily as needed for diarrhea or loose stools. 04/27/20   Gatha Mayer, MD  ?DULoxetine (CYMBALTA) 60 MG capsule TAKE 1 CAPSULE BY MOUTH EVERY DAY 03/20/20   Jacelyn Pi, Lilia Argue, MD  ?escitalopram (LEXAPRO) 10 MG tablet Take 10 mg by mouth daily. 08/07/21   [provider]  ?esomeprazole (NEXIUM) 40 MG capsule TAKE 1 CAPSULE BY MOUTH EVERY DAY BEFORE BREAKFAST 07/20/20   Gatha Mayer, MD  ?fluticasone (FLONASE) 50 MCG/ACT nasal spray SPRAY 2 SPRAYS INTO EACH NOSTRIL EVERY DAY ?Patient taking differently: Patient takes as needed 03/08/20   Jacelyn Pi, Lilia Argue, MD  ?gabapentin (NEURONTIN) 300 MG capsule Take 300 mg by mouth at bedtime. 05/30/21   [provider]  ?glucose blood (ONETOUCH VERIO) test strip Check blood glucose 3-4 x day. Dx E11.9, E11.649, z79.4, 11/06/20   Maximiano Coss, NP  ?insulin aspart (NOVOLOG FLEXPEN) 100 UNIT/ML FlexPen Max Daily 50 units 05/31/21   Shamleffer, Melanie Crazier, MD  ?Insulin Glargine (BASAGLAR KWIKPEN) 100 UNIT/ML Inject 30 Units into the skin at bedtime. ?  Patient taking differently: Inject 17 Units into the skin at bedtime. 05/31/21   Shamleffer, Melanie Crazier, MD  ?Insulin Pen Needle 32G X 4 MM MISC 1 Device by Does not apply route in the morning, at noon, in the evening, and at bedtime. 05/31/21   Shamleffer, Melanie Crazier, MD  ?Lancets [facility] DELICA PLUS [id number]) MISC 1 each by Does not apply route 3 (three) times daily. Dx E11.65, z79.4 01/24/19   Jacelyn Pi, Lilia Argue, MD  ?lidocaine (LIDODERM) 5 % Place 1 patch onto the skin daily. Remove & Discard patch within 12 hours or as directed by MD 02/12/21   Loni Beckwith, PA-C  ?lidocaine (XYLOCAINE) 2 % solution Use as directed 15 mLs in the mouth or throat as needed for mouth pain. 03/09/20   Gatha Mayer, MD  ?lisinopril (ZESTRIL) 10 MG tablet Take 10 mg by mouth daily. 08/07/21   [provider]  ?mesalamine (LIALDA) 1.2 g EC tablet Take 2 tablets (2.4 g total) by mouth 2 (two) times daily. 04/28/20 04/23/21  Gatha Mayer, MD  ?methocarbamol (ROBAXIN) 500 MG tablet Take 1 tablet (500 mg total) by mouth every 8 (eight) hours as needed for muscle spasms. 02/11/21   Long, Wonda Olds, MD  ?metoCLOPramide (REGLAN) 10 MG tablet Take 1 tablet (10 mg total) by mouth 4 (four) times daily. 08/05/21   Willia Craze, NP  ?montelukast (SINGULAIR) 10 MG tablet TAKE 1 TABLET BY MOUTH EVERYDAY AT BEDTIME 11/27/20   Just, Laurita Quint, FNP  ?Multiple Vitamins-Minerals (WOMENS 50+ MULTI VITAMIN/MIN PO) Take by mouth.    [provider]  ?NONFORMULARY OR COMPOUNDED ITEM Swish and spit 5 mLs as needed (ulcer flare up in mouth). Magic mouthwash with lidocaine disp#150 ml 05/04/20   Gatha Mayer, MD  ?ondansetron (ZOFRAN ODT) 4 MG disintegrating tablet Take 1 tablet (4 mg total) by mouth every 8 (eight) hours as needed for nausea or vomiting. ?Patient not taking: Reported on 08/29/2021 05/08/20   Margette Fast, MD  ?phenazopyridine (PYRIDIUM) 200 MG tablet Take 1 tablet (200 mg total) by mouth 3 (three) times daily as needed for pain. 04/02/21   Volney American, PA-C  ?predniSONE (DELTASONE) 20 MG tablet Take 2 tablets (40 mg total) by mouth daily. 10/24/21   Vanessa Kick, MD  ?prochlorperazine (COMPAZINE) 25 MG suppository Place 1 suppository (25 mg total) rectally every 12 (twelve) hours as needed for nausea or vomiting. 07/16/21   Willia Craze, NP  ?rosuvastatin (CRESTOR) 40 MG tablet 1 tablet    [provider]  ?saccharomyces boulardii (FLORASTOR) 250 MG capsule See admin instructions. 06/24/21   [provider]  ?senna-docusate (SENOKOT-S)  8.6-50 MG tablet Take 1 tablet by mouth at bedtime as needed for mild constipation or moderate constipation. 02/11/21   Long, Wonda Olds, MD  ?triamcinolone cream (KENALOG) 0.1 % Apply 1 application topically 2 (two) times daily. 04/19/20   Daleen Squibb, MD  ?   ? ?Allergies    ?Sulfa antibiotics, Sulfamethoxazole-trimethoprim, Codeine, and Latex   ? ?Review of Systems   ?Review of Systems  ?Constitutional:  Negative for fever.  ?Respiratory:  Positive for cough.   ?Cardiovascular:  Negative for chest pain.  ?Musculoskeletal:  Negative for joint swelling.  ?All other systems reviewed and are negative. ? ?Physical Exam ?Updated Vital Signs ?BP (!) 147/93   Pulse 69   Temp 98.9 ?F (37.2 ?C) (Oral)   Resp 14   Ht 5' 2"  (1.575  m)   Wt 73 kg   SpO2 99%   BMI 29.45 kg/m?  ?Physical Exam ?Vitals and nursing note reviewed.  ?Constitutional:   ?   Appearance: She is well-developed.  ?HENT:  ?   Head: Normocephalic.  ?   Nose: Nose normal.  ?   Mouth/Throat:  ?   Mouth: Mucous membranes are moist.  ?Eyes:  ?   Extraocular Movements: Extraocular movements intact.  ?   Pupils: Pupils are equal, round, and reactive to light.  ?Cardiovascular:  ?   Rate and Rhythm: Normal rate and regular rhythm.  ?Pulmonary:  ?   Effort: Pulmonary effort is normal.  ?   Breath sounds: Normal breath sounds.  ?Abdominal:  ?   General: Abdomen is flat. There is no distension.  ?Musculoskeletal:     ?   General: Normal range of motion.  ?   Cervical back: Normal range of motion.  ?Skin: ?   General: Skin is warm.  ?Neurological:  ?   General: No focal deficit present.  ?   Mental Status: She is alert and oriented to person, place, and time.  ?Psychiatric:     ?   Mood and Affect: Mood normal.  ? ? ?ED Results / Procedures / Treatments   ?Labs ?(all labs ordered are listed, but only abnormal results are displayed) ?Labs Reviewed  ?CBC - Abnormal; Notable for the following components:  ?    Result Value  ? WBC 13.3 (*)   ? Platelets 443 (*)    ? All other components within normal limits  ?BASIC METABOLIC PANEL - Abnormal; Notable for the following components:  ? Glucose, Bld 103 (*)   ? All other components within normal limits  ?D-DIMER, Jamse Arn

## 2021-12-27 ENCOUNTER — Ambulatory Visit (INDEPENDENT_AMBULATORY_CARE_PROVIDER_SITE_OTHER): Payer: HMO | Admitting: Internal Medicine

## 2021-12-27 ENCOUNTER — Encounter: Payer: Self-pay | Admitting: Internal Medicine

## 2021-12-27 ENCOUNTER — Other Ambulatory Visit: Payer: Self-pay

## 2021-12-27 VITALS — BP 124/80 | HR 90 | Ht 62.0 in | Wt 165.0 lb

## 2021-12-27 DIAGNOSIS — R739 Hyperglycemia, unspecified: Secondary | ICD-10-CM

## 2021-12-27 DIAGNOSIS — Z794 Long term (current) use of insulin: Secondary | ICD-10-CM | POA: Diagnosis not present

## 2021-12-27 DIAGNOSIS — E1165 Type 2 diabetes mellitus with hyperglycemia: Secondary | ICD-10-CM

## 2021-12-27 LAB — POCT GLYCOSYLATED HEMOGLOBIN (HGB A1C): Hemoglobin A1C: 7.7 % — AB (ref 4.0–5.6)

## 2021-12-27 MED ORDER — ACCU-CHEK MULTICLIX LANCETS MISC: 12 refills | Status: DC

## 2021-12-27 MED ORDER — ACCU-CHEK AVIVA PLUS VI STRP: ORAL_STRIP | 12 refills | Status: DC

## 2021-12-27 NOTE — Progress Notes (Signed)
?Name: Beverly Ramirez  ?Age/ Sex: 61 y.o., female   ?MRN/ DOB: [id number], 08-15-1961    ? ?PCP: Leeroy Cha, MD   ?Reason for Endocrinology Evaluation: Type 2 Diabetes Mellitus  ?Initial Endocrine Consultative Visit: 07/23/2020  ? ? ?PATIENT IDENTIFIER: [name] is a 61 y.o. female with a past medical history of T2DM, OSA, fibromyalgia and UC. The patient has followed with Endocrinology clinic since 07/23/2020 for consultative assistance with management of her diabetes. ? ?DIABETIC HISTORY:  ?[name] was diagnosed with DM in 2019.Has been on insulin intermittently while on prednisone,  Metformin- too sick. Marland Kitchen Her hemoglobin A1c has ranged from 7.1%in 2021 , peaking at 9.5% in 2020 ? ? ? ?On her initial visit to our clinic she had an A1c of 7.2 % she was on basal insulin and Novolog, We added farxiga  ? ? ?SUBJECTIVE:  ? ?During the last visit (08/28/2021): A1c 7.6 % Adjusted  MDI regimen and continued [place]  ? ? ?  ?Today (12/27/2021): [name] is here for a follow up diabetes management.  She checks her blood sugars multiple times a day through freestyle libre  ? ? ?She had an ED visit for facial swelling, she was started on prednisone taper 10/2021 ?Had another visit to the ED for arm pain 12/2021 and elevated D-dimer, PE ruled out ? ?She stopped Freestyle libre with arm pain , she continues with left arm pain  ? ?She has been feeling dizziness for the past few day, but no nausea  ?Has pain and burning in the toes  ? ? ?HOME DIABETES REGIMEN:  ?Farxiga 10 mg, 1 tablet with breakfast  ?Basaglar 28 units daily ?Novolog 20 units TIDQAC ?CF : Novolog ( BG -130/30) ? ? ? ? ?Statin: yes ?ACE-I/ARB: no ? ? ? ?GLUCOSE METER: ?122-214 mg/dL  ? ? ? ? ?DIABETIC COMPLICATIONS: ?Microvascular complications:  ?Neuropathy ?Denies: CKD, retinopathy ?Last Eye Exam: Completed 2022 ? ?Macrovascular complications:  ? ?Denies: CAD, CVA, PVD ? ? ?HISTORY:  ?Past Medical History:  ?Past Medical History:   ?Diagnosis Date  ? Anemia   ? Anxiety   ? Carpal tunnel syndrome   ? bilateral  ? Chronic heel pain   ? Diabetes mellitus without complication ([place])   ? DJD (degenerative joint disease) of cervical spine   ? MRI 2017  ? Hyperlipidemia   ? Hypertension   ? Plantar fasciitis   ? bilateral  ? Sickle cell anemia (HCC)   ? sickle cell trait  ? Sleep apnea   ? Tendonitis   ? left hand/wrist  ? UC (ulcerative colitis) ([place])   ? ?Past Surgical History:  ?Past Surgical History:  ?Procedure Laterality Date  ? ABDOMINAL HYSTERECTOMY    ? APPENDECTOMY    ? CESAREAN SECTION    ? x4  ? COLONOSCOPY    ? Multiple in [place]  ? ESOPHAGOGASTRODUODENOSCOPY    ? ?Social History:  reports that she quit smoking about 15 years ago. Her smoking use included cigarettes. She has never used smokeless tobacco. She reports current alcohol use. She reports that she does not use drugs. ?Family History:  ?Family History  ?Problem Relation Age of Onset  ? Stroke Mother   ? Diabetes Mother   ? Hypertension Mother   ? Lung cancer Mother   ? Sickle cell trait Mother   ? Sickle cell trait Father   ? Sickle cell anemia Sister 71  ? Sickle cell anemia Sister   ? Other Sister   ?  accident and blood clot formed  ? Diabetes Maternal Aunt   ? Lung cancer Maternal Aunt   ? Lung cancer Maternal Aunt   ? Lung cancer Maternal Aunt   ? Lung cancer Maternal Grandmother   ? Colon cancer Cousin 69  ? Pancreatic cancer Neg Hx   ? ? ? ?HOME MEDICATIONS: ?Allergies as of 12/27/2021   ? ?   Reactions  ? Sulfa Antibiotics Hives  ? Sulfamethoxazole-trimethoprim Hives, Itching  ? Codeine Itching, Rash, Hives, Other (See Comments)  ? Latex Hives  ? With the power  ? ?  ? ?  ?Medication List  ?  ? ?  ? Accurate as of December 27, 2021  1:03 PM. If you have any questions, ask your nurse or doctor.  ?  ?  ? ?  ? ?STOP taking these medications   ? ?predniSONE 20 MG tablet ?Commonly known as: DELTASONE ?Stopped by: Dorita Sciara, MD ?  ? ?  ? ?TAKE these medications    ? ?albuterol 108 (90 Base) MCG/ACT inhaler ?Commonly known as: VENTOLIN HFA ?Inhale 2 puffs into the lungs every 6 (six) hours as needed for wheezing or shortness of breath. ?  ?amLODipine 5 MG tablet ?Commonly known as: NORVASC ?TAKE 1 TABLET BY MOUTH EVERY DAY ?  ?Basaglar KwikPen 100 UNIT/ML ?Inject 30 Units into the skin at bedtime. ?What changed: how much to take ?  ?chlorzoxazone 500 MG tablet ?Commonly known as: PARAFON ?Take 500 mg by mouth 3 (three) times daily as needed. ?  ?dapagliflozin propanediol 10 MG Tabs tablet ?Commonly known as: [place] ?Take 1 tablet (10 mg total) by mouth daily. ?  ?diazepam 5 MG tablet ?Commonly known as: VALIUM ?SMARTSIG:1 Tablet(s) By Mouth ?  ?dicyclomine 20 MG tablet ?Commonly known as: BENTYL ?Take 1 tablet (20 mg total) by mouth 3 (three) times daily as needed for spasms (abdominal cramping). ?  ?diphenoxylate-atropine 2.5-0.025 MG tablet ?Commonly known as: LOMOTIL ?Take 1 tablet by mouth 4 (four) times daily as needed for diarrhea or loose stools. ?  ?DULoxetine 60 MG capsule ?Commonly known as: CYMBALTA ?TAKE 1 CAPSULE BY MOUTH EVERY DAY ?  ?escitalopram 10 MG tablet ?Commonly known as: LEXAPRO ?Take 10 mg by mouth daily. ?  ?esomeprazole 40 MG capsule ?Commonly known as: [place] ?TAKE 1 CAPSULE BY MOUTH EVERY DAY BEFORE BREAKFAST ?  ?fluticasone 50 MCG/ACT nasal spray ?Commonly known as: FLONASE ?SPRAY 2 SPRAYS INTO EACH NOSTRIL EVERY DAY ?What changed: See the new instructions. ?  ?FreeStyle Libre 2 Sensor Misc ?Change every 14 days ?  ?gabapentin 300 MG capsule ?Commonly known as: NEURONTIN ?Take 300 mg by mouth at bedtime. ?  ?Insulin Pen Needle 32G X 4 MM Misc ?1 Device by Does not apply route in the morning, at noon, in the evening, and at bedtime. ?  ?lidocaine 2 % solution ?Commonly known as: XYLOCAINE ?Use as directed 15 mLs in the mouth or throat as needed for mouth pain. ?  ?lidocaine 5 % ?Commonly known as: Lidoderm ?Place 1 patch onto the skin daily.  Remove & Discard patch within 12 hours or as directed by MD ?  ?lisinopril 10 MG tablet ?Commonly known as: ZESTRIL ?Take 10 mg by mouth daily. ?  ?mesalamine 1.2 g EC tablet ?Commonly known as: LIALDA ?Take 2 tablets (2.4 g total) by mouth 2 (two) times daily. ?  ?methocarbamol 500 MG tablet ?Commonly known as: ROBAXIN ?Take 1 tablet (500 mg total) by mouth every 8 (eight) hours as  needed for muscle spasms. ?  ?metoCLOPramide 10 MG tablet ?Commonly known as: Reglan ?Take 1 tablet (10 mg total) by mouth 4 (four) times daily. ?  ?montelukast 10 MG tablet ?Commonly known as: SINGULAIR ?TAKE 1 TABLET BY MOUTH EVERYDAY AT BEDTIME ?  ?NONFORMULARY OR COMPOUNDED ITEM ?Swish and spit 5 mLs as needed (ulcer flare up in mouth). Magic mouthwash with lidocaine disp#150 ml ?  ?NovoLOG FlexPen 100 UNIT/ML FlexPen ?Generic drug: insulin aspart ?Max Daily 50 units ?  ?ondansetron 4 MG disintegrating tablet ?Commonly known as: Zofran ODT ?Take 1 tablet (4 mg total) by mouth every 8 (eight) hours as needed for nausea or vomiting. ?  ?OneTouch Delica Plus [id number] Misc ?1 each by Does not apply route 3 (three) times daily. Dx E11.65, z79.4 ?  ?OneTouch Verio test strip ?Generic drug: glucose blood ?Check blood glucose 3-4 x day. Dx E11.9, E11.649, z79.4, ?  ?phenazopyridine 200 MG tablet ?Commonly known as: PYRIDIUM ?Take 1 tablet (200 mg total) by mouth 3 (three) times daily as needed for pain. ?  ?prochlorperazine 25 MG suppository ?Commonly known as: COMPAZINE ?Place 1 suppository (25 mg total) rectally every 12 (twelve) hours as needed for nausea or vomiting. ?  ?rosuvastatin 40 MG tablet ?Commonly known as: CRESTOR ?1 tablet ?  ?saccharomyces boulardii 250 MG capsule ?Commonly known as: FLORASTOR ?See admin instructions. ?  ?senna-docusate 8.6-50 MG tablet ?Commonly known as: Senokot-S ?Take 1 tablet by mouth at bedtime as needed for mild constipation or moderate constipation. ?  ?triamcinolone cream 0.1 % ?Commonly known as:  KENALOG ?Apply 1 application topically 2 (two) times daily. ?  ?WOMENS 50+ MULTI VITAMIN/MIN PO ?Take by mouth. ?  ? ?  ? ? ? ?OBJECTIVE:  ? ?Vital Signs: BP 124/80 (BP Location: Left Arm, Patient Position: Sitting,

## 2021-12-27 NOTE — Patient Instructions (Addendum)
-   Continue Farxiga  10 mg, 1 tablet with breakfast  ?- Increase Basaglar 28  units daily  ?- Continue Novolog 20  units with meals  ?-Novolog correctional insulin: ADD extra units on insulin to your meal-time Novolog dose if your blood sugars are higher than 160. Use the scale below to help guide you:  ? ?Blood sugar before meal Number of units to inject  ?Less than 160 0 unit  ?161 - 190 1 units  ?191 - 220 2 units  ?221 -  250 3 units  ?251 -  280 4 units  ?281 -  310 5 units  ? ?Capsaicin Cream for toe burning  ? ?HOW TO TREAT LOW BLOOD SUGARS (Blood sugar LESS THAN 70 MG/DL) ?Please follow the RULE OF 15 for the treatment of hypoglycemia treatment (when your (blood sugars are less than 70 mg/dL)  ? ?STEP 1: Take 15 grams of carbohydrates when your blood sugar is low, which includes:  ?4 GLUCOSE TABS  OR ?4 OZ OF JUICE OR REGULAR SODA OR ?ONE TUBE OF GLUCOSE GEL   ? ?STEP 2: RECHECK blood sugar in 15 MINUTES ?STEP 3: If your blood sugar is still low at the 15 minute recheck --> then, go back to STEP 1 and treat AGAIN with another 15 grams of carbohydrates ?

## 2022-01-01 ENCOUNTER — Encounter: Payer: Self-pay | Admitting: Internal Medicine

## 2022-01-01 ENCOUNTER — Ambulatory Visit: Payer: HMO | Admitting: Internal Medicine

## 2022-01-01 VITALS — BP 120/78 | HR 78 | Temp 98.7°F | Resp 18 | Ht 62.0 in | Wt 165.0 lb

## 2022-01-01 DIAGNOSIS — H1013 Acute atopic conjunctivitis, bilateral: Secondary | ICD-10-CM | POA: Diagnosis not present

## 2022-01-01 DIAGNOSIS — J4520 Mild intermittent asthma, uncomplicated: Secondary | ICD-10-CM

## 2022-01-01 DIAGNOSIS — J310 Chronic rhinitis: Secondary | ICD-10-CM

## 2022-01-01 DIAGNOSIS — J302 Other seasonal allergic rhinitis: Secondary | ICD-10-CM

## 2022-01-01 DIAGNOSIS — Z882 Allergy status to sulfonamides status: Secondary | ICD-10-CM

## 2022-01-01 DIAGNOSIS — T781XXA Other adverse food reactions, not elsewhere classified, initial encounter: Secondary | ICD-10-CM

## 2022-01-01 DIAGNOSIS — Z91040 Latex allergy status: Secondary | ICD-10-CM

## 2022-01-01 MED ORDER — OLOPATADINE HCL 0.2 % OP SOLN: 1.0000 [drp] | Freq: Every day | OPHTHALMIC | 5 refills | Status: DC | PRN

## 2022-01-01 MED ORDER — MONTELUKAST SODIUM 10 MG PO TABS: 10.0000 mg | ORAL_TABLET | Freq: Every day | ORAL | 1 refills | Status: DC

## 2022-01-01 NOTE — Progress Notes (Signed)
? ?NEW PATIENT ?Date of Service/Encounter:  01/01/22 ?Referring provider: Leeroy Cha,* ?Primary care provider: Leeroy Cha, MD ? ?Subjective:  ?Beverly Ramirez is a 61 y.o. female with a PMHx of ulcerative colitis, insulin-dependent diabetic, fibromyalgia, De Quervain's tenosynovitis, hyperlipidemia, neoplasm of meninges presenting today for evaluation of chronic rhinitis.  ?History obtained from: chart review and patient. ?  ?She moved here from [place] 5 years ago.  Her allergies started when moving to [place]. ?Having itchy eyes and swelling of the face.  Face gets itchy, throat itchy.  ?Symptoms during Fall and Summer.  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  She will get significant facial swelling during pollen season. Has been to ED on 10/24/21 for these symptoms.  She was told to take benadryl but it makes her too drowsy.  She was given decadron and prednisone but this flares her ulcerative colitis and her diabetes. ?She is taking singulair, flonase and claritin.  She does carry an albuterol inhaler because her chest will feel tight when her allergies are acting up.  Uses less than once per month.   ?She did have Covid infection March 2023, she used her inhaler during this time.  ?She has never had allergy testing, but used to get a "shot" every pollen season and would be given bendaryl but it made her sleepy.  ?Never been on allergy injections. ?No reflux or heartburn but does have ulcerative colitis. ? ?Food allergies:  ?Peanuts-it bothers her colitis and had food allergy testing for ulcerative colitis which was positive to peanuts, tree nuts and eggs.  She eats tree nuts sometimes but was also told to avoid due to her UC.  ?She eats eggs without symptoms. ?Carries an epipen.  ? ?Other allergy screening: ?Medication allergy:  yes-rash from sulfas, latex causes rash ?Eczema: possibly , does use triamcinolone on her heels which crack and are dry, triamcinolone helps with the  cracking ? ? ?Past Medical History: ?Past Medical History:  ?Diagnosis Date  ? Anemia   ? Anxiety   ? Carpal tunnel syndrome   ? bilateral  ? Chronic heel pain   ? Diabetes mellitus without complication ([place])   ? DJD (degenerative joint disease) of cervical spine   ? MRI 2017  ? Hyperlipidemia   ? Hypertension   ? Plantar fasciitis   ? bilateral  ? Sickle cell anemia ([facility])   ? sickle cell trait  ? Sleep apnea   ? Tendonitis   ? left hand/wrist  ? UC (ulcerative colitis) ([place])   ? ?Medication List:  ?Current Outpatient Medications  ?Medication Sig Dispense Refill  ? albuterol (VENTOLIN HFA) 108 (90 Base) MCG/ACT inhaler Inhale 2 puffs into the lungs every 6 (six) hours as needed for wheezing or shortness of breath. 18 g 3  ? amLODipine (NORVASC) 5 MG tablet TAKE 1 TABLET BY MOUTH EVERY DAY 90 tablet 0  ? chlorzoxazone (PARAFON) 500 MG tablet Take 500 mg by mouth 3 (three) times daily as needed.    ? Continuous Blood Gluc Sensor (FREESTYLE LIBRE 2 SENSOR) MISC Change every 14 days 6 each 3  ? dapagliflozin propanediol (FARXIGA) 10 MG TABS tablet Take 1 tablet (10 mg total) by mouth daily. 90 tablet 3  ? diazepam (VALIUM) 5 MG tablet SMARTSIG:1 Tablet(s) By Mouth    ? dicyclomine (BENTYL) 20 MG tablet Take 1 tablet (20 mg total) by mouth 3 (three) times daily as needed for spasms (abdominal cramping). 20 tablet 0  ? diphenoxylate-atropine (LOMOTIL) 2.5-0.025 MG  tablet Take 1 tablet by mouth 4 (four) times daily as needed for diarrhea or loose stools. 60 tablet 0  ? DULoxetine (CYMBALTA) 60 MG capsule TAKE 1 CAPSULE BY MOUTH EVERY DAY 90 capsule 1  ? escitalopram (LEXAPRO) 10 MG tablet Take 10 mg by mouth daily.    ? esomeprazole (NEXIUM) 40 MG capsule TAKE 1 CAPSULE BY MOUTH EVERY DAY BEFORE BREAKFAST 30 capsule 11  ? fluticasone (FLONASE) 50 MCG/ACT nasal spray SPRAY 2 SPRAYS INTO EACH NOSTRIL EVERY DAY (Patient taking differently: Patient takes as needed) 48 mL 5  ? gabapentin (NEURONTIN) 300 MG capsule Take 300 mg  by mouth at bedtime.    ? glucose blood (ACCU-CHEK AVIVA PLUS) test strip Check sugar 3-4 times daily  E11.9 100 each 12  ? glucose blood (ONETOUCH VERIO) test strip Check blood glucose 3-4 x day. Dx E11.9, E11.649, z79.4, 100 each 12  ? insulin aspart (NOVOLOG FLEXPEN) 100 UNIT/ML FlexPen Max Daily 50 units 30 mL 11  ? Insulin Glargine (BASAGLAR KWIKPEN) 100 UNIT/ML Inject 30 Units into the skin at bedtime. (Patient taking differently: Inject 17 Units into the skin at bedtime.) 30 mL 4  ? Insulin Pen Needle 32G X 4 MM MISC 1 Device by Does not apply route in the morning, at noon, in the evening, and at bedtime. 400 each 3  ? Lancets (ACCU-CHEK MULTICLIX) lancets Check sugar 3-4 times daily  E11.9 100 each 12  ? Lancets (ONETOUCH DELICA PLUS [id number]) MISC 1 each by Does not apply route 3 (three) times daily. Dx E11.65, z79.4 100 each 11  ? lidocaine (LIDODERM) 5 % Place 1 patch onto the skin daily. Remove & Discard patch within 12 hours or as directed by MD 30 patch 0  ? lidocaine (XYLOCAINE) 2 % solution Use as directed 15 mLs in the mouth or throat as needed for mouth pain. 200 mL 0  ? lisinopril (ZESTRIL) 10 MG tablet Take 10 mg by mouth daily.    ? methocarbamol (ROBAXIN) 500 MG tablet Take 1 tablet (500 mg total) by mouth every 8 (eight) hours as needed for muscle spasms. 20 tablet 0  ? metoCLOPramide (REGLAN) 10 MG tablet Take 1 tablet (10 mg total) by mouth 4 (four) times daily. 3 tablet 0  ? montelukast (SINGULAIR) 10 MG tablet TAKE 1 TABLET BY MOUTH EVERYDAY AT BEDTIME 90 tablet 1  ? Multiple Vitamins-Minerals (WOMENS 50+ MULTI VITAMIN/MIN PO) Take by mouth.    ? NONFORMULARY OR COMPOUNDED ITEM Swish and spit 5 mLs as needed (ulcer flare up in mouth). Magic mouthwash with lidocaine disp#150 ml 150 each 1  ? ondansetron (ZOFRAN ODT) 4 MG disintegrating tablet Take 1 tablet (4 mg total) by mouth every 8 (eight) hours as needed for nausea or vomiting. 20 tablet 0  ? phenazopyridine (PYRIDIUM) 200 MG tablet  Take 1 tablet (200 mg total) by mouth 3 (three) times daily as needed for pain. 12 tablet 0  ? prochlorperazine (COMPAZINE) 25 MG suppository Place 1 suppository (25 mg total) rectally every 12 (twelve) hours as needed for nausea or vomiting. 30 suppository 2  ? rosuvastatin (CRESTOR) 40 MG tablet 1 tablet    ? saccharomyces boulardii (FLORASTOR) 250 MG capsule See admin instructions.    ? senna-docusate (SENOKOT-S) 8.6-50 MG tablet Take 1 tablet by mouth at bedtime as needed for mild constipation or moderate constipation. 20 tablet 0  ? triamcinolone cream (KENALOG) 0.1 % Apply 1 application topically 2 (two) times daily. 30 g 2  ?  mesalamine (LIALDA) 1.2 g EC tablet Take 2 tablets (2.4 g total) by mouth 2 (two) times daily. 360 tablet 3  ? ?Current Facility-Administered Medications  ?Medication Dose Route Frequency Provider Last Rate Last Admin  ? 0.9 %  sodium chloride infusion  500 mL Intravenous Once Pyrtle, Lajuan Lines, MD      ? ?Known Allergies:  ?Allergies  ?Allergen Reactions  ? Sulfa Antibiotics Hives  ? Sulfamethoxazole-Trimethoprim Hives and Itching  ? Codeine Itching, Rash, Hives and Other (See Comments)  ? Latex Hives  ?  With the power  ? ?Past Surgical History: ?Past Surgical History:  ?Procedure Laterality Date  ? ABDOMINAL HYSTERECTOMY    ? APPENDECTOMY    ? CESAREAN SECTION    ? x4  ? COLONOSCOPY    ? Multiple in [place]  ? ESOPHAGOGASTRODUODENOSCOPY    ? ?Family History: ?Family History  ?Problem Relation Age of Onset  ? Stroke Mother   ? Diabetes Mother   ? Hypertension Mother   ? Lung cancer Mother   ? Sickle cell trait Mother   ? Sickle cell trait Father   ? Sickle cell anemia Sister 11  ? Sickle cell anemia Sister   ? Other Sister   ?     accident and blood clot formed  ? Diabetes Maternal Aunt   ? Lung cancer Maternal Aunt   ? Lung cancer Maternal Aunt   ? Lung cancer Maternal Aunt   ? Lung cancer Maternal Grandmother   ? Colon cancer Cousin 72  ? Pancreatic cancer Neg Hx   ? ?Social History:  Sinthia lives in a house about 5 years ago, no water damage, carpet floors, gas heating, central AC, dogs outdoors, no pets indoors, no dust mite protection, no smoke exposure.  She is retired.  No HEPA filter in

## 2022-01-01 NOTE — Patient Instructions (Addendum)
Chronic Rhinitis -seasonal and perennial allergic: ?- allergy testing today was positive to grass pollen, weed pollen, tree pollen, cat, borderline to dust mites.  ?- allergen avoidance as below ?- consider allergy shots as long term control of your symptoms by teaching your immune system to be more tolerant of your allergy triggers ?- Continue Nasal Steroid Spray: Options include Flonase (fluticasone), Nasocort (triamcinolone), Nasonex (mometasome) 1- 2 sprays in each nostril daily (can buy over-the-counter if not covered by insurance)  Best results if used daily. ?- Continue Singulair (Montelukast) 9m nightly. ?- Continue over the counter antihistamine daily or daily as needed. Can increase to twice daily on days when symptoms are at their worst.  ?-Your options include Zyrtec (Cetirizine) 1101m Claritin (Loratadine) 1030mAllegra (Fexofenadine) 180m22mr Xyzal (Levocetirinze) 5mg 50mAllergic Conjunctivitis:  ?- Consider Allergy Eye drops: great options include Pataday (Olopatadine) or Zaditor (ketotifen) for eye symptoms daily as needed-both sold over the counter if not covered by insurance.   ?-Avoid eye drops that say red eye relief as they may contain medications that dry out your eyes. ? ?Intermittent Asthma: ?- your lung testing today looked great ?- Rescue Inhaler: Albuterol (Proair/Ventolin) 2 puffs . Use  every 4-6 hours as needed for chest tightness, wheezing, or coughing.  Can also use 15 minutes prior to exercise if you have symptoms with activity. ?- Asthma is not controlled if: ? - Symptoms are occurring >2 times a week OR ? - >2 times a month nighttime awakenings ? - You are requiring systemic steroids (prednisone/steroid injections) more than once per year ? - Your require hospitalization for your asthma. ? - Please call the clinic to schedule a follow up if these symptoms arise ? ?Ulcerative colitis with previous food allergy testing positive to peanuts, tree nuts and eggs:  ?- no concerning  true allergy to these foods, but in sensitized individuals, avoidance diets may be helpful---follow your GI specialist's advice regarding avoidance, but no reason for strict avoidance from allergy standpoint ?- allergy testing today to most common food allergens including peanuts, tree nuts and eggs was negative today ? ?Contact dermatitis to latex:  ?- avoidance recommended ? ?Sulfa allergy (rash) ?- advised avoidance, if no other option available, can discuss history in more detail to determine if needs desensitization vs oral challenge ? ?Hand and Foot Dermatitis:  ?Daily Care For Maintenance (daily and continue even once eczema controlled) ?- Use hypoallergenic hydrating ointment at least twice daily.  This must be done daily for control of flares. (Great options include Vaseline, CeraVe, Aquaphor, Aveeno, Cetaphil, VaniCream, etc) ?- Avoid detergents, soaps or lotions with fragrances/dyes ?- Limit showers/baths to 5 minutes and use luke warm water instead of hot, pat dry following baths, and apply moisturizer ?- can use steroid/non-steroid therapy creams as detailed below up to twice weekly for prevention of flares. ? ?For Flares:(add this to maintenance therapy if needed for flares) ?First apply steroid/non-steroid treatment creams. Wait 5 minutes then apply moisturizer.  ?- Triamcinolone 0.1% to body for moderate flares-apply topically twice daily to red, raised areas of skin, followed by moisturizer ? ?Follow-up in 3 months, sooner for allergy injections if you decide to pursue.  ?It was a pleasure meeting you today!  ? ?DUST MITE AVOIDANCE MEASURES: ? ?There are three main measures that need and can be taken to avoid house dust mites: ? ?Reduce accumulation of dust in general ?-reduce furniture, clothing, carpeting, books, stuffed animals, especially in bedroom ? ?Separate yourself from the dust ?-use  pillow and mattress encasements (can be found at stores such as [employer] or online) ?-avoid  direct exposure to air condition flow ?-use a HEPA filter device, especially in the bedroom; you can also use a HEPA filter vacuum cleaner ?-wipe dust with a moist towel instead of a dry towel or broom when cleaning ? ?Decrease mites and/or their secretions ?-wash clothing and linen and stuffed animals at highest temperature possible, at least every 2 weeks ?-stuffed animals can also be placed in a bag and put in a freezer overnight ? ?Despite the above measures, it is impossible to eliminate dust mites or their allergen completely from your home.  With the above measures the burden of mites in your home can be diminished, with the goal of minimizing your allergic symptoms.  Success will be reached only when implementing and using all means together. ? ?Reducing Pollen Exposure ? ?The American Academy of Allergy, Asthma and Immunology suggests the following steps to reduce your exposure to pollen during allergy seasons. ?   ?Do not hang sheets or clothing out to dry; pollen may collect on these items. ?Do not mow lawns or spend time around freshly cut grass; mowing stirs up pollen. ?Keep windows closed at night.  Keep car windows closed while driving. ?Minimize morning activities outdoors, a time when pollen counts are usually at their highest. ?Stay indoors as much as possible when pollen counts or humidity is high and on windy days when pollen tends to remain in the air longer. ?Use air conditioning when possible.  Many air conditioners have filters that trap the pollen spores. ?Use a HEPA room air filter to remove pollen form the indoor air you breathe. ? ?d ? ? ?

## 2022-01-20 DIAGNOSIS — M5451 Vertebrogenic low back pain: Secondary | ICD-10-CM | POA: Diagnosis not present

## 2022-01-20 DIAGNOSIS — M25512 Pain in left shoulder: Secondary | ICD-10-CM | POA: Diagnosis not present

## 2022-01-20 DIAGNOSIS — M542 Cervicalgia: Secondary | ICD-10-CM | POA: Diagnosis not present

## 2022-02-04 DIAGNOSIS — E663 Overweight: Secondary | ICD-10-CM | POA: Diagnosis not present

## 2022-02-04 DIAGNOSIS — F410 Panic disorder [episodic paroxysmal anxiety] without agoraphobia: Secondary | ICD-10-CM | POA: Diagnosis not present

## 2022-02-04 DIAGNOSIS — K51819 Other ulcerative colitis with unspecified complications: Secondary | ICD-10-CM | POA: Diagnosis not present

## 2022-02-04 DIAGNOSIS — G43909 Migraine, unspecified, not intractable, without status migrainosus: Secondary | ICD-10-CM | POA: Diagnosis not present

## 2022-02-04 DIAGNOSIS — I6523 Occlusion and stenosis of bilateral carotid arteries: Secondary | ICD-10-CM | POA: Diagnosis not present

## 2022-02-04 DIAGNOSIS — E785 Hyperlipidemia, unspecified: Secondary | ICD-10-CM | POA: Diagnosis not present

## 2022-02-04 DIAGNOSIS — G8929 Other chronic pain: Secondary | ICD-10-CM | POA: Diagnosis not present

## 2022-02-04 DIAGNOSIS — K51019 Ulcerative (chronic) pancolitis with unspecified complications: Secondary | ICD-10-CM | POA: Diagnosis not present

## 2022-02-04 DIAGNOSIS — J449 Chronic obstructive pulmonary disease, unspecified: Secondary | ICD-10-CM | POA: Diagnosis not present

## 2022-02-04 DIAGNOSIS — I10 Essential (primary) hypertension: Secondary | ICD-10-CM | POA: Diagnosis not present

## 2022-02-04 DIAGNOSIS — E1169 Type 2 diabetes mellitus with other specified complication: Secondary | ICD-10-CM | POA: Diagnosis not present

## 2022-02-04 DIAGNOSIS — Z794 Long term (current) use of insulin: Secondary | ICD-10-CM | POA: Diagnosis not present

## 2022-02-05 DIAGNOSIS — M25512 Pain in left shoulder: Secondary | ICD-10-CM | POA: Diagnosis not present

## 2022-02-13 ENCOUNTER — Other Ambulatory Visit (HOSPITAL_COMMUNITY): Payer: Self-pay

## 2022-02-13 ENCOUNTER — Telehealth: Payer: Self-pay | Admitting: Pharmacy Technician

## 2022-02-13 ENCOUNTER — Other Ambulatory Visit: Payer: Self-pay

## 2022-02-13 MED ORDER — ACCU-CHEK MULTICLIX LANCETS MISC: 12 refills | Status: DC

## 2022-02-13 NOTE — Telephone Encounter (Signed)
Will print out and fax to [facility]

## 2022-02-13 NOTE — Telephone Encounter (Signed)
Do we need to do anything for it to be covered under part B

## 2022-02-13 NOTE — Telephone Encounter (Signed)
Patient Advocate Encounter  Received notification from [employer] that the request for prior authorization for Accu-Chek Fastclix lancets has been denied by the pt's Part D covg. Covered under part B.

## 2022-02-17 DIAGNOSIS — M25512 Pain in left shoulder: Secondary | ICD-10-CM | POA: Diagnosis not present

## 2022-02-26 DIAGNOSIS — M25512 Pain in left shoulder: Secondary | ICD-10-CM | POA: Diagnosis not present

## 2022-02-26 DIAGNOSIS — M75122 Complete rotator cuff tear or rupture of left shoulder, not specified as traumatic: Secondary | ICD-10-CM | POA: Diagnosis not present

## 2022-02-28 DIAGNOSIS — M542 Cervicalgia: Secondary | ICD-10-CM | POA: Diagnosis not present

## 2022-03-10 DIAGNOSIS — Z03818 Encounter for observation for suspected exposure to other biological agents ruled out: Secondary | ICD-10-CM | POA: Diagnosis not present

## 2022-03-10 DIAGNOSIS — J069 Acute upper respiratory infection, unspecified: Secondary | ICD-10-CM | POA: Diagnosis not present

## 2022-03-16 ENCOUNTER — Emergency Department (HOSPITAL_COMMUNITY): Payer: HMO

## 2022-03-16 ENCOUNTER — Emergency Department (HOSPITAL_COMMUNITY)
Admission: EM | Admit: 2022-03-16 | Discharge: 2022-03-16 | Disposition: A | Discharge status: Home / Self Care | Payer: HMO | Attending: Emergency Medicine | Admitting: Emergency Medicine

## 2022-03-16 ENCOUNTER — Encounter (HOSPITAL_COMMUNITY): Payer: Self-pay

## 2022-03-16 ENCOUNTER — Other Ambulatory Visit: Payer: Self-pay

## 2022-03-16 DIAGNOSIS — E1165 Type 2 diabetes mellitus with hyperglycemia: Secondary | ICD-10-CM | POA: Diagnosis not present

## 2022-03-16 DIAGNOSIS — R0789 Other chest pain: Secondary | ICD-10-CM | POA: Insufficient documentation

## 2022-03-16 DIAGNOSIS — Z79899 Other long term (current) drug therapy: Secondary | ICD-10-CM | POA: Insufficient documentation

## 2022-03-16 DIAGNOSIS — M19011 Primary osteoarthritis, right shoulder: Secondary | ICD-10-CM | POA: Diagnosis not present

## 2022-03-16 DIAGNOSIS — M542 Cervicalgia: Secondary | ICD-10-CM | POA: Diagnosis not present

## 2022-03-16 DIAGNOSIS — J9811 Atelectasis: Secondary | ICD-10-CM | POA: Diagnosis not present

## 2022-03-16 DIAGNOSIS — M25511 Pain in right shoulder: Secondary | ICD-10-CM | POA: Diagnosis not present

## 2022-03-16 DIAGNOSIS — M79601 Pain in right arm: Secondary | ICD-10-CM | POA: Diagnosis present

## 2022-03-16 DIAGNOSIS — R079 Chest pain, unspecified: Secondary | ICD-10-CM | POA: Diagnosis not present

## 2022-03-16 DIAGNOSIS — Z91040 Latex allergy status: Secondary | ICD-10-CM | POA: Insufficient documentation

## 2022-03-16 DIAGNOSIS — M549 Dorsalgia, unspecified: Secondary | ICD-10-CM | POA: Insufficient documentation

## 2022-03-16 DIAGNOSIS — I10 Essential (primary) hypertension: Secondary | ICD-10-CM | POA: Insufficient documentation

## 2022-03-16 DIAGNOSIS — D72829 Elevated white blood cell count, unspecified: Secondary | ICD-10-CM | POA: Insufficient documentation

## 2022-03-16 DIAGNOSIS — Z794 Long term (current) use of insulin: Secondary | ICD-10-CM | POA: Insufficient documentation

## 2022-03-16 LAB — CBC WITH DIFFERENTIAL/PLATELET
Abs Immature Granulocytes: 0.06 10*3/uL (ref 0.00–0.07)
Basophils Absolute: 0 10*3/uL (ref 0.0–0.1)
Basophils Relative: 0 %
Eosinophils Absolute: 0.7 10*3/uL — ABNORMAL HIGH (ref 0.0–0.5)
Eosinophils Relative: 6 %
HCT: 37.4 % (ref 36.0–46.0)
Hemoglobin: 12.4 g/dL (ref 12.0–15.0)
Immature Granulocytes: 1 %
Lymphocytes Relative: 23 %
Lymphs Abs: 2.8 10*3/uL (ref 0.7–4.0)
MCH: 28.4 pg (ref 26.0–34.0)
MCHC: 33.2 g/dL (ref 30.0–36.0)
MCV: 85.6 fL (ref 80.0–100.0)
Monocytes Absolute: 0.9 10*3/uL (ref 0.1–1.0)
Monocytes Relative: 8 %
Neutro Abs: 7.4 10*3/uL (ref 1.7–7.7)
Neutrophils Relative %: 62 %
Platelets: 375 10*3/uL (ref 150–400)
RBC: 4.37 MIL/uL (ref 3.87–5.11)
RDW: 12.7 % (ref 11.5–15.5)
WBC: 11.9 10*3/uL — ABNORMAL HIGH (ref 4.0–10.5)
nRBC: 0 % (ref 0.0–0.2)

## 2022-03-16 LAB — COMPREHENSIVE METABOLIC PANEL
ALT: 29 U/L (ref 0–44)
AST: 32 U/L (ref 15–41)
Albumin: 3.8 g/dL (ref 3.5–5.0)
Alkaline Phosphatase: 106 U/L (ref 38–126)
Anion gap: 8 (ref 5–15)
BUN: 10 mg/dL (ref 6–20)
CO2: 27 mmol/L (ref 22–32)
Calcium: 9.1 mg/dL (ref 8.9–10.3)
Chloride: 105 mmol/L (ref 98–111)
Creatinine, Ser: 0.85 mg/dL (ref 0.44–1.00)
GFR, Estimated: >60 mL/min (ref >60)
Glucose, Bld: 223 mg/dL — ABNORMAL HIGH (ref 70–99)
Potassium: 3.5 mmol/L (ref 3.5–5.1)
Sodium: 140 mmol/L (ref 135–145)
Total Bilirubin: 0.5 mg/dL (ref 0.3–1.2)
Total Protein: 7.9 g/dL (ref 6.5–8.1)

## 2022-03-16 LAB — TROPONIN I (HIGH SENSITIVITY)
Troponin I (High Sensitivity): 4 ng/L (ref <18)
Troponin I (High Sensitivity): 4 ng/L (ref <18)

## 2022-03-16 MED ORDER — ACETAMINOPHEN 325 MG PO TABS
650.0000 mg | ORAL_TABLET | Freq: Once | ORAL | Status: AC
  Administered 2022-03-16: 650 mg via ORAL
  Filled 2022-03-16: qty 2

## 2022-03-16 MED ORDER — OXYCODONE-ACETAMINOPHEN 5-325 MG PO TABS
1.0000 | ORAL_TABLET | Freq: Once | ORAL | Status: DC
  Filled 2022-03-16: qty 1

## 2022-03-16 MED ORDER — LIDOCAINE 5 % EX PTCH
2.0000 | MEDICATED_PATCH | CUTANEOUS | Status: DC
  Administered 2022-03-16: 2 via TRANSDERMAL
  Filled 2022-03-16: qty 2

## 2022-03-16 MED ORDER — METHOCARBAMOL 500 MG PO TABS
500.0000 mg | ORAL_TABLET | Freq: Once | ORAL | Status: AC
  Administered 2022-03-16: 500 mg via ORAL
  Filled 2022-03-16: qty 1

## 2022-03-16 MED ORDER — METHOCARBAMOL 500 MG PO TABS: 500.0000 mg | ORAL_TABLET | Freq: Three times a day (TID) | ORAL | 0 refills | Status: DC | PRN

## 2022-03-16 MED ORDER — KETOROLAC TROMETHAMINE 15 MG/ML IJ SOLN
15.0000 mg | Freq: Once | INTRAMUSCULAR | Status: AC
  Administered 2022-03-16: 15 mg via INTRAVENOUS
  Filled 2022-03-16: qty 1

## 2022-03-16 MED ORDER — DICLOFENAC SODIUM 1 % EX GEL: 4.0000 g | Freq: Four times a day (QID) | CUTANEOUS | 0 refills | Status: DC | PRN

## 2022-03-16 MED ORDER — FENTANYL CITRATE PF 50 MCG/ML IJ SOSY
50.0000 ug | PREFILLED_SYRINGE | Freq: Once | INTRAMUSCULAR | Status: AC
  Administered 2022-03-16: 50 ug via INTRAVENOUS
  Filled 2022-03-16: qty 1

## 2022-03-16 NOTE — ED Provider Triage Note (Addendum)
Emergency Medicine Provider Triage Evaluation Note  Beverly Ramirez , a 61 y.o. female  was evaluated in triage.  Pt complains of chest pain. Patient reports pain to there right chest, back and arm. Worse with RUE movement, no alleviating factors.  Review of Systems  Positive: Chest pain, arthralgia, myalgia, back pain Negative: Numbness, dyspnea  Physical Exam  BP 140/84 (BP Location: Right Arm)   Pulse 73   Temp 98.2 F (36.8 C) (Oral)   Resp 18   Ht 5' 2"  (1.575 m)   Wt 74.8 kg   SpO2 95%   BMI 30.18 kg/m  Gen:   Awake, no distress   Resp:  Normal effort  MSK:   TTP to the right anterior chest wall, trapezius muscle and proximal RUE. Compartments soft in the RUE. 2+ radial pulse.   Medical Decision Making  Medically screening exam initiated at 7:21 AM.  Appropriate orders placed.  Beverly Ramirez was informed that the remainder of the evaluation will be completed by another provider, this initial triage assessment does not replace that evaluation, and the importance of remaining in the ED until their evaluation is complete.  Primary suspicion for MSK pain w/ reproducibility on exam however given pain started in the chest will obtain cardiac work up.    Beverly Dyke, PA-C 03/16/22 9458  Initially ordered percocet for pain- patient has not tolerated this well previously, will trial tylenol, lidoderm patches, and robaxin     Beverly Dyke, PA-C 03/16/22 5929
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
## 2022-03-16 NOTE — ED Provider Notes (Signed)
[facility] DEPT Provider Note   CSN: [id number] Arrival date & time: 03/16/22  9211     History  Chief Complaint  Ramirez presents with   Arm Pain    Ikhlas Albo is a 61 y.o. female with a history of type 2 diabetes Ramirez, Beverly Ramirez, Beverly Ramirez, Beverly Ramirez reports she developed pain in the right chest first Beverly started to have some pain in the right back Beverly into the right upper arm.  Pain has gotten more severe.  It is constant, worse with movement of the right upper extremity, no alleviating factors.  Denies numbness, paresthesias, dyspnea, pain/swelling, hemoptysis, recent surgery/trauma, recent long travel, hormone use, personal hx of cancer, or hx of DVT/PE.    HPI     Home Medications Prior to Admission medications   Medication Sig Start Date End Date Taking? Authorizing Provider  albuterol (VENTOLIN HFA) 108 (90 Base) MCG/ACT inhaler Inhale 2 puffs into the lungs every 6 (six) hours as needed for wheezing or shortness of breath. 03/13/20   Jacelyn Pi, Lilia Argue, MD  amLODipine (NORVASC) 5 MG tablet TAKE 1 TABLET BY MOUTH EVERY DAY 06/08/20   Jacelyn Pi, Lilia Argue, MD  chlorzoxazone (PARAFON) 500 MG tablet Take 500 mg by mouth 3 (three) times daily as needed. 08/12/21   [provider]  Continuous Blood Gluc Sensor (FREESTYLE LIBRE 2 SENSOR) MISC Change every 14 days 10/11/21   Shamleffer, Melanie Crazier, MD  dapagliflozin propanediol (FARXIGA) 10 MG TABS tablet Take 1 tablet (10 mg total) by mouth daily. 10/26/20   Shamleffer, Melanie Crazier, MD  diazepam (VALIUM) 5 MG tablet SMARTSIG:1 Tablet(s) By Mouth 08/12/21   [provider]  dicyclomine (BENTYL) 20 MG tablet Take 1 tablet (20 mg total) by mouth 3 (three) times daily as needed for spasms (abdominal cramping). 10/18/20   Wendie Agreste, MD   diphenoxylate-atropine (LOMOTIL) 2.5-0.025 MG tablet Take 1 tablet by mouth 4 (four) times daily as needed for diarrhea or loose stools. 04/27/20   Gatha Mayer, MD  DULoxetine (CYMBALTA) 60 MG capsule TAKE 1 CAPSULE BY MOUTH EVERY DAY 03/20/20   Jacelyn Pi, Lilia Argue, MD  escitalopram (LEXAPRO) 10 MG tablet Take 10 mg by mouth daily. 08/07/21   [provider]  esomeprazole (NEXIUM) 40 MG capsule TAKE 1 CAPSULE BY MOUTH EVERY DAY BEFORE BREAKFAST 07/20/20   Gatha Mayer, MD  fluticasone [facility]) 50 MCG/ACT nasal spray SPRAY 2 SPRAYS INTO EACH NOSTRIL EVERY DAY Ramirez taking differently: Ramirez takes as needed 03/08/20   Jacelyn Pi, Lilia Argue, MD  gabapentin (NEURONTIN) 300 MG capsule Take 300 mg by mouth at bedtime. 05/30/21   [provider]  glucose blood (ACCU-CHEK AVIVA PLUS) test strip Check sugar 3-4 times daily  E11.9 12/27/21   Shamleffer, Melanie Crazier, MD  glucose blood (ONETOUCH VERIO) test strip Check blood glucose 3-4 x day. Dx E11.9, E11.649, z79.4, 11/06/20   Maximiano Coss, NP  insulin aspart (NOVOLOG FLEXPEN) 100 UNIT/ML FlexPen Max Daily 50 units 05/31/21   Shamleffer, Melanie Crazier, MD  Insulin Glargine (BASAGLAR KWIKPEN) 100 UNIT/ML Inject 30 Units into the skin at bedtime. Ramirez taking differently: Inject 17 Units into the skin at bedtime. 05/31/21   Shamleffer, Melanie Crazier, MD  Insulin Pen Needle 32G X 4 MM MISC 1 Device by Does not apply route in the morning, at noon,  in the evening, Beverly at bedtime. 05/31/21   Shamleffer, Melanie Crazier, MD  Lancets (ACCU-CHEK MULTICLIX) lancets Check sugar 3-4 times daily  E11.9 02/13/22   Shamleffer, Melanie Crazier, MD  Lancets [facility] DELICA PLUS [id number]) MISC 1 each by Does not apply route 3 (three) times daily. Dx E11.65, z79.4 01/24/19   Jacelyn Pi, Lilia Argue, MD  lidocaine (LIDODERM) 5 % Place 1 patch onto the skin daily. Remove & Discard patch within 12 hours or as directed by MD 02/12/21   Loni Beckwith, PA-C  lidocaine (XYLOCAINE) 2 % solution Use as directed 15 mLs in the mouth or throat as needed for mouth pain. 03/09/20   Gatha Mayer, MD  lisinopril (ZESTRIL) 10 MG tablet Take 10 mg by mouth daily. 08/07/21   [provider]  mesalamine (LIALDA) 1.2 g EC tablet Take 2 tablets (2.4 g total) by mouth 2 (two) times daily. 04/28/20 04/23/21  Gatha Mayer, MD  methocarbamol (ROBAXIN) 500 MG tablet Take 1 tablet (500 mg total) by mouth every 8 (eight) hours as needed for muscle spasms. 02/11/21   Long, Wonda Olds, MD  metoCLOPramide (REGLAN) 10 MG tablet Take 1 tablet (10 mg total) by mouth 4 (four) times daily. 08/05/21   Willia Craze, NP  montelukast (SINGULAIR) 10 MG tablet Take 1 tablet (10 mg total) by mouth at bedtime. 01/01/22   Clemon Chambers, MD  Multiple Vitamins-Minerals (WOMENS 50+ [place] VITAMIN/MIN PO) Take by mouth.    [provider]  NONFORMULARY OR COMPOUNDED ITEM Swish Beverly spit 5 mLs as needed (ulcer flare up in mouth). Magic mouthwash with lidocaine disp#150 ml 05/04/20   Gatha Mayer, MD  Olopatadine HCl 0.2 % SOLN Apply 1 drop to eye daily as needed. 01/01/22   Clemon Chambers, MD  ondansetron (ZOFRAN ODT) 4 MG disintegrating tablet Take 1 tablet (4 mg total) by mouth every 8 (eight) hours as needed for nausea or vomiting. 05/08/20   Long, Wonda Olds, MD  phenazopyridine (PYRIDIUM) 200 MG tablet Take 1 tablet (200 mg total) by mouth 3 (three) times daily as needed for pain. 04/02/21   Volney American, PA-C  prochlorperazine (COMPAZINE) 25 MG suppository Place 1 suppository (25 mg total) rectally every 12 (twelve) hours as needed for nausea or vomiting. 07/16/21   Willia Craze, NP  rosuvastatin (CRESTOR) 40 MG tablet 1 tablet    [provider]  saccharomyces boulardii (FLORASTOR) 250 MG capsule See admin instructions. 06/24/21   [provider]  senna-docusate (SENOKOT-S) 8.6-50 MG tablet Take 1 tablet by mouth at bedtime as  needed for mild constipation or moderate constipation. 02/11/21   Long, Wonda Olds, MD  triamcinolone cream (KENALOG) 0.1 % Apply 1 application topically 2 (two) times daily. 04/19/20   Daleen Squibb, MD      Allergies    Sulfa antibiotics, Sulfamethoxazole-trimethoprim, Codeine, Beverly Latex    Review of Systems   Review of Systems  Constitutional:  Negative for chills Beverly fever.  Respiratory:  Negative for shortness of breath.   Cardiovascular:  Positive for chest pain. Negative for leg swelling.  Gastrointestinal:  Negative for abdominal pain Beverly vomiting.  Musculoskeletal:  Positive for arthralgias, back pain Beverly myalgias.  Neurological:  Negative for syncope Beverly numbness.  All other systems reviewed Beverly are negative.   Physical Exam Updated Vital Signs BP 136/87   Pulse 62   Temp 98.2 F (36.8 C) (Oral)   Resp 18  Ht 5' 2"  (1.575 m)   Wt 74.8 kg   SpO2 100%   BMI 30.18 kg/m  Physical Exam Vitals Beverly nursing note reviewed.  Constitutional:      General: She is not in acute distress.    Appearance: She is well-developed. She is not toxic-appearing.  HENT:     Head: Normocephalic Beverly atraumatic.  Eyes:     General:        Right eye: No discharge.        Left eye: No discharge.     Conjunctiva/sclera: Conjunctivae normal.  Cardiovascular:     Rate Beverly Rhythm: Normal rate Beverly regular rhythm.     Comments: 2+ symmetric radial pulses. Pulmonary:     Effort: No respiratory distress.     Breath sounds: Normal breath sounds. No wheezing or rales.  Chest:     Chest wall: Tenderness (Right anterior chest wall.) present.  Abdominal:     General: There is no distension.     Palpations: Abdomen is soft.     Tenderness: There is no abdominal tenderness.  Musculoskeletal:     Cervical back: Neck supple. Muscular tenderness (Right cervical paraspinal muscles) present.     Right lower leg: No edema.     Left lower leg: No edema.     Comments: Upper extremities: no obvious  deformities, erythema, rashes, or ecchymosis.  Limited range of motion of the right shoulder secondary to pain.  Full flexion/extension of the elbow Beverly wrist Beverly movement of all digits intact.  Diffusely tender to the right glenohumeral joint as well as the proximal two thirds of the humerus.  Compartments are soft.  Otherwise nontender.  Back: Right upper thoracic paraspinal muscle tenderness.  No midline tenderness.  Skin:    General: Skin is warm Beverly dry.  Neurological:     Mental Status: She is alert.     Comments: Clear speech.  Sensation grossly intact bilateral upper extremities. 5/5 symmetric grip strength.   Psychiatric:        Behavior: Behavior normal.     ED Results / Procedures / Treatments   Labs (all labs ordered are listed, but only abnormal results are displayed) Labs Reviewed  COMPREHENSIVE METABOLIC PANEL - Abnormal; Notable for the following components:      Result Value   Glucose, Bld 223 (*)    All other components within normal limits  CBC WITH DIFFERENTIAL/PLATELET - Abnormal; Notable for the following components:   WBC 11.9 (*)    Eosinophils Absolute 0.7 (*)    All other components within normal limits  TROPONIN I (HIGH SENSITIVITY)  TROPONIN I (HIGH SENSITIVITY)    EKG EKG Interpretation  Date/Time:  Sunday March 16 2022 06:32:08 EDT Ventricular Rate:  71 PR Interval:  160 QRS Duration: 81 QT Interval:  402 QTC Calculation: 437 R Axis:   -3 Text Interpretation: Sinus rhythm Low voltage, precordial leads Abnormal R-wave progression, early transition Confirmed by Lennice Sites ([id number]) on 03/16/2022 10:48:30 AM  Radiology DG Shoulder Right  Result Date: 03/16/2022 CLINICAL DATA:  Shoulder pain. EXAM: RIGHT SHOULDER - 2+ VIEW COMPARISON:  None Available. FINDINGS: No signs of acute fracture or dislocation. Mild degenerative changes are noted at the acromioclavicular Beverly glenohumeral joints. Soft tissues are unremarkable. IMPRESSION: 1. No acute findings.  2. Mild osteoarthritis. Electronically Signed   By: Kerby Moors M.D.   On: 03/16/2022 10:46   DG Chest 2 View  Result Date: 03/16/2022 CLINICAL DATA:  Chest pain. EXAM: CHEST -  2 VIEW COMPARISON:  CT 12/20/2021. FINDINGS: Heart size is normal. No signs of pleural effusion or edema. Lung volumes are low. Atelectasis is identified in the left base. No signs of airspace consolidation. IMPRESSION: 1. Low lung volumes. 2. Left base atelectasis. Electronically Signed   By: Kerby Moors M.D.   On: 03/16/2022 06:55    Procedures Procedures    Medications Ordered in ED Medications  lidocaine (LIDODERM) 5 % 2 patch (2 patches Transdermal Patch Applied 03/16/22 0749)  acetaminophen (TYLENOL) tablet 650 mg (650 mg Oral Given 03/16/22 0748)  methocarbamol (ROBAXIN) tablet 500 mg (500 mg Oral Given 03/16/22 0748)  ketorolac (TORADOL) 15 MG/ML injection 15 mg (15 mg Intravenous Given 03/16/22 8144)    ED Course/ Medical Decision Making/ A&P                           Medical Decision Making Amount Beverly/or Complexity of Data Reviewed Labs: ordered. Radiology: ordered.  Risk OTC drugs. Prescription drug management.   Ramirez presents to the emergency department with chest pain. Ramirez nontoxic appearing, in no apparent distress, vitals without significant abnormality. Fairly benign physical exam.   DDX including but not limited to: ACS, pulmonary embolism, dissection, pneumothorax, pneumonia, arrhythmia, severe anemia, MSK, GERD, anxiety, abdominal process.   Additional history obtained:  Chart & nursing note reviewed.  Ramirez seen in the ED April 2023, she presented with right upper extremity pain at that time as well, she had a CT angio of her chest that was negative for PE Beverly did not show any findings of aortic aneurysm.  EKG: No STEMI, Sinus rhythm Low voltage, precordial leads Abnormal R-wave progression, early transition  Lab Tests:  I reviewed & interpreted labs including:  CBC: Mild  leukocytosis CMP: Hyperglycemia without acidosis or anion gap elevation Troponin: Flat  Imaging Studies ordered:  I ordered Beverly viewed the following imaging, agree with radiologist impression:  Chest x-ray:1. Low lung volumes. 2. Left base atelectasis Right shoulder xray:  1. No acute findings. 2. Mild osteoarthritis  ED Course:  I ordered medications including Tylenol, Robaxin, Lidoderm patches, Beverly Toradol for pain.    EKG without obvious acute ischemia, delta troponin negative, doubt ACS. Ramirez is low risk wells, low suspicion for pulmonary embolism.  Pain is not a tearing sensation, symmetric pulses, no widening of mediastinum on CXR, no aortic aneurysm on most recent CT angio in April of this year, low suspicion for  dissection.  EKG, chest x-ray, Beverly labs overall reassuring.  Ramirez feeling improved.  Unclear definitive etiology however favor musculoskeletal pain given reproducibility with palpation Beverly with movement of the right upper extremity. NVI distally. Will discharge home with supportive care Beverly outpatient follow-up.  Based on Ramirez's chief complaint, I considered admission might be necessary, however after reassuring ED workup feel Ramirez is reasonable for discharge.   I discussed results, treatment plan, need for follow-up, Beverly return precautions with the Ramirez. Provided opportunity for questions, Ramirez confirmed understanding Beverly is in agreement with plan.   Findings Beverly plan of care discussed with supervising physician Dr. Ronnald Nian who is in agreement.   Portions of this note were generated with [employer]. Dictation errors may occur despite best attempts at proofreading.   Final Clinical Impression(s) / ED Diagnoses Final diagnoses:  Acute pain of right shoulder  Chest pain, unspecified type    Rx / DC Orders ED Discharge Orders  Ordered    methocarbamol (ROBAXIN) 500 MG tablet  Every 8 hours PRN        03/16/22 1150     diclofenac Sodium (VOLTAREN) 1 % GEL  4 times daily PRN        03/16/22 1150              Laketia Vicknair, [place] R, PA-C 03/16/22 1415    Lennice Sites, DO 03/16/22 1441

## 2022-03-16 NOTE — Discharge Instructions (Addendum)
You were seen in the emergency department today for chest, back, and shoulder pain.  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  Your labs were overall reassuring, your blood sugar was elevated, please be sure to monitor this at home.   We are sending you with the following medicines: -Diclofenac gel: Apply to affected area every 6 hours as needed for pain.  This is to help with pain and inflammation.. Do not take other nonsteroidal anti-inflammatory medications with this such as Advil, Motrin, Aleve, Mobic, Goodie Powder, or Motrin etc..    - Robaxin-take 1 to 2 tablets every 8 hours as needed.  This is the muscle relaxer I have prescribed, this is meant to help with muscle tightness/spasms. Be aware that this medication may make you drowsy therefore the first time you take this it should be at a time you are in an environment where you can rest. Do not drive or operate heavy machinery when taking this medication. Do not drink alcohol or take other sedating medications with this medicine such as narcotics or benzodiazepines.    You make take Tylenol per over the counter dosing with these medications.   We have prescribed you new medication(s) today. Discuss the medications prescribed today with your pharmacist as they can have adverse effects and interactions with your other medicines including over the counter and prescribed medications. Seek medical evaluation if you start to experience new or abnormal symptoms after taking one of these medicines, seek care immediately if you start to experience difficulty breathing, feeling of your throat closing, facial swelling, or rash as these could be indications of a more serious allergic reaction  Talk with your primary care provider as well as your orthopedic surgeon as soon as possible.  Return to the ER for any new or worsening symptoms or any other concerns.

## 2022-03-26 DIAGNOSIS — M7522 Bicipital tendinitis, left shoulder: Secondary | ICD-10-CM | POA: Diagnosis not present

## 2022-03-26 DIAGNOSIS — M75121 Complete rotator cuff tear or rupture of right shoulder, not specified as traumatic: Secondary | ICD-10-CM | POA: Diagnosis not present

## 2022-03-26 DIAGNOSIS — M75122 Complete rotator cuff tear or rupture of left shoulder, not specified as traumatic: Secondary | ICD-10-CM | POA: Diagnosis not present

## 2022-03-26 DIAGNOSIS — M7542 Impingement syndrome of left shoulder: Secondary | ICD-10-CM | POA: Diagnosis not present

## 2022-03-26 DIAGNOSIS — M19011 Primary osteoarthritis, right shoulder: Secondary | ICD-10-CM | POA: Diagnosis not present

## 2022-03-26 DIAGNOSIS — M7551 Bursitis of right shoulder: Secondary | ICD-10-CM | POA: Diagnosis not present

## 2022-03-26 DIAGNOSIS — M7541 Impingement syndrome of right shoulder: Secondary | ICD-10-CM | POA: Diagnosis not present

## 2022-03-26 DIAGNOSIS — G8918 Other acute postprocedural pain: Secondary | ICD-10-CM | POA: Diagnosis not present

## 2022-03-26 DIAGNOSIS — M19012 Primary osteoarthritis, left shoulder: Secondary | ICD-10-CM | POA: Diagnosis not present

## 2022-03-26 DIAGNOSIS — M24111 Other articular cartilage disorders, right shoulder: Secondary | ICD-10-CM | POA: Diagnosis not present

## 2022-03-26 DIAGNOSIS — M7521 Bicipital tendinitis, right shoulder: Secondary | ICD-10-CM | POA: Diagnosis not present

## 2022-03-26 DIAGNOSIS — M7552 Bursitis of left shoulder: Secondary | ICD-10-CM | POA: Diagnosis not present

## 2022-03-26 DIAGNOSIS — S4382XA Sprain of other specified parts of left shoulder girdle, initial encounter: Secondary | ICD-10-CM | POA: Diagnosis not present

## 2022-04-02 DIAGNOSIS — M25512 Pain in left shoulder: Secondary | ICD-10-CM | POA: Diagnosis not present

## 2022-04-02 DIAGNOSIS — M25612 Stiffness of left shoulder, not elsewhere classified: Secondary | ICD-10-CM | POA: Diagnosis not present

## 2022-04-15 DIAGNOSIS — M25512 Pain in left shoulder: Secondary | ICD-10-CM | POA: Diagnosis not present

## 2022-04-17 DIAGNOSIS — M25512 Pain in left shoulder: Secondary | ICD-10-CM | POA: Diagnosis not present

## 2022-04-22 ENCOUNTER — Ambulatory Visit (HOSPITAL_COMMUNITY)
Admission: EM | Admit: 2022-04-22 | Discharge: 2022-04-22 | Disposition: A | Discharge status: Home / Self Care | Payer: HMO | Attending: Family Medicine | Admitting: Family Medicine

## 2022-04-22 ENCOUNTER — Ambulatory Visit (INDEPENDENT_AMBULATORY_CARE_PROVIDER_SITE_OTHER): Payer: HMO

## 2022-04-22 ENCOUNTER — Encounter (HOSPITAL_COMMUNITY): Payer: Self-pay | Admitting: *Deleted

## 2022-04-22 DIAGNOSIS — Z20822 Contact with and (suspected) exposure to covid-19: Secondary | ICD-10-CM | POA: Insufficient documentation

## 2022-04-22 DIAGNOSIS — R509 Fever, unspecified: Secondary | ICD-10-CM | POA: Diagnosis not present

## 2022-04-22 DIAGNOSIS — J069 Acute upper respiratory infection, unspecified: Secondary | ICD-10-CM | POA: Diagnosis not present

## 2022-04-22 DIAGNOSIS — R059 Cough, unspecified: Secondary | ICD-10-CM

## 2022-04-22 HISTORY — DX: Sickle-cell trait: D57.3

## 2022-04-22 HISTORY — DX: Fibromyalgia: M79.7

## 2022-04-22 LAB — POC INFLUENZA A AND B ANTIGEN (URGENT CARE ONLY)
INFLUENZA A ANTIGEN, POC: NEGATIVE
INFLUENZA B ANTIGEN, POC: NEGATIVE

## 2022-04-22 MED ORDER — ACETAMINOPHEN 325 MG PO TABS
650.0000 mg | ORAL_TABLET | Freq: Once | ORAL | Status: AC
Start: 2022-04-22 — End: 2022-04-22
  Administered 2022-04-22: 650 mg via ORAL

## 2022-04-22 MED ORDER — ACETAMINOPHEN 325 MG PO TABS
ORAL_TABLET | ORAL | Status: AC
  Filled 2022-04-22: qty 2

## 2022-04-22 MED ORDER — BENZONATATE 100 MG PO CAPS: 100.0000 mg | ORAL_CAPSULE | Freq: Three times a day (TID) | ORAL | 0 refills | Status: DC | PRN

## 2022-04-22 NOTE — Discharge Instructions (Addendum)
Your flu test was negative Your chest x-ray did not show any pneumonia   You have been swabbed for COVID, and the test will result in the next 24 hours. Our staff will call you if positive. If the test is positive, you should quarantine for 5 days from the start of your symptoms  Take benzonatate 100 mg, 1 tab every 8 hours as needed for cough.

## 2022-04-22 NOTE — ED Triage Notes (Signed)
C/O fever up 101.3, body aches, cough onset 3 days ago. Has been taking Tyl - none today.  Pt is S/P left shoulder rotator cuff repair 3 wks ago.

## 2022-04-22 NOTE — ED Provider Notes (Signed)
[facility]    CSN: [id number] Arrival date & time: 04/22/22  1717      History   Chief Complaint Chief Complaint  Patient presents with   Fever    HPI Beverly Ramirez is a 61 y.o. female.    Fever  Here for fever to 101.3, chills, cough and congestion.  She is bringing up a lot of phlegm that she feels like is from her chest.  She has also had some chest tightness and her albuterol inhaler helps when she uses it. No vomiting or diarrhea  Symptoms began the morning of August 13.  Past medical history significant for diabetes.  Last EGFR was over 60  She states that if she ever takes steroids that she has severe elevations of her sugar  Past Medical History:  Diagnosis Date   Anemia    Anxiety    Carpal tunnel syndrome    bilateral   Chronic heel pain    Diabetes mellitus without complication (HCC)    DJD (degenerative joint disease) of cervical spine    MRI 2017   Fibromyalgia    Hyperlipidemia    Hypertension    Plantar fasciitis    bilateral   Sickle cell trait (HCC)    Sleep apnea    Tendonitis    left hand/wrist   UC (ulcerative colitis) ([place])     Patient Active Problem List   Diagnosis Date Noted   Allergic conjunctivitis of both eyes 01/01/2022   Seasonal and perennial allergic rhinitis 01/01/2022   Latex allergy 01/01/2022   Allergy to sulfa drugs 01/01/2022   Mild intermittent asthma 01/01/2022   Type 2 diabetes mellitus with hyperglycemia, with long-term current use of insulin ([place]) 05/31/2020   Fibromyalgia 05/31/2020   Mouth ulcers 05/25/2020   Elevated glucose 12/23/2019   Diabetic ketoacidosis without coma associated with type 2 diabetes mellitus ([place]) 12/23/2019   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   De Quervain's tenosynovitis, left 11/16/2018   Bilateral carpal tunnel syndrome 04/13/2018   Leukocytosis 10/04/2017   Hyperlipidemia 08/04/2017   Ulcerative colitis with complication ([place]) 54/62/7035   Plantar fasciitis  05/28/2017    Past Surgical History:  Procedure Laterality Date   ABDOMINAL HYSTERECTOMY     APPENDECTOMY     CESAREAN SECTION     x4   COLONOSCOPY     Multiple in [place]   ESOPHAGOGASTRODUODENOSCOPY     ROTATOR CUFF REPAIR Left    03/26/2022    OB History     Gravida  4   Para  4   Term      Preterm      AB      Living  4      SAB      IAB      Ectopic      Multiple      Live Births               Home Medications    Prior to Admission medications   Medication Sig Start Date End Date Taking? Authorizing Provider  albuterol (VENTOLIN HFA) 108 (90 Base) MCG/ACT inhaler Inhale 2 puffs into the lungs every 6 (six) hours as needed for wheezing or shortness of breath. 03/13/20  Yes Jacelyn Pi, Lilia Argue, MD  amLODipine (NORVASC) 5 MG tablet TAKE 1 TABLET BY MOUTH EVERY DAY 06/08/20  Yes Jacelyn Pi, Lilia Argue, MD  benzonatate (TESSALON) 100 MG capsule Take 1 capsule (100 mg total) by mouth 3 (three) times  daily as needed for cough. 04/22/22  Yes Rhianna Raulerson, Gwenlyn Perking, MD  chlorzoxazone (PARAFON) 500 MG tablet Take 500 mg by mouth 3 (three) times daily as needed. 08/12/21  Yes [provider]  dapagliflozin propanediol (FARXIGA) 10 MG TABS tablet Take 1 tablet (10 mg total) by mouth daily. 10/26/20  Yes Shamleffer, Melanie Crazier, MD  diazepam (VALIUM) 5 MG tablet SMARTSIG:1 Tablet(s) By Mouth 08/12/21  Yes [provider]  diclofenac Sodium (VOLTAREN) 1 % GEL Apply 4 g topically 4 (four) times daily as needed (pain). 03/16/22  Yes Petrucelli, Samantha R, PA-C  DULoxetine (CYMBALTA) 60 MG capsule TAKE 1 CAPSULE BY MOUTH EVERY DAY 03/20/20  Yes Jacelyn Pi, Irma M, MD  escitalopram (LEXAPRO) 10 MG tablet Take 10 mg by mouth daily. 08/07/21  Yes [provider]  insulin aspart (NOVOLOG FLEXPEN) 100 UNIT/ML FlexPen Max Daily 50 units 05/31/21  Yes Shamleffer, Melanie Crazier, MD  Insulin Glargine (BASAGLAR KWIKPEN) 100 UNIT/ML Inject 30 Units into  the skin at bedtime. Patient taking differently: Inject 17 Units into the skin at bedtime. 05/31/21  Yes Shamleffer, Melanie Crazier, MD  lisinopril (ZESTRIL) 10 MG tablet Take 10 mg by mouth daily. 08/07/21  Yes [provider]  mesalamine (LIALDA) 1.2 g EC tablet Take 2 tablets (2.4 g total) by mouth 2 (two) times daily. 04/28/20 04/22/22 Yes Gatha Mayer, MD  methocarbamol (ROBAXIN) 500 MG tablet Take 1-2 tablets (500-1,000 mg total) by mouth every 8 (eight) hours as needed for muscle spasms. 03/16/22  Yes Petrucelli, Samantha R, PA-C  montelukast (SINGULAIR) 10 MG tablet Take 1 tablet (10 mg total) by mouth at bedtime. 01/01/22  Yes Clemon Chambers, MD  Multiple Vitamins-Minerals (WOMENS 50+ [place] VITAMIN/MIN PO) Take by mouth.   Yes [provider]  rosuvastatin (CRESTOR) 40 MG tablet 1 tablet   Yes [provider]  Continuous Blood Gluc Sensor (FREESTYLE LIBRE 2 SENSOR) MISC Change every 14 days 10/11/21   Shamleffer, Melanie Crazier, MD  dicyclomine (BENTYL) 20 MG tablet Take 1 tablet (20 mg total) by mouth 3 (three) times daily as needed for spasms (abdominal cramping). 10/18/20   Wendie Agreste, MD  diphenoxylate-atropine (LOMOTIL) 2.5-0.025 MG tablet Take 1 tablet by mouth 4 (four) times daily as needed for diarrhea or loose stools. 04/27/20   Gatha Mayer, MD  esomeprazole (NEXIUM) 40 MG capsule TAKE 1 CAPSULE BY MOUTH EVERY DAY BEFORE BREAKFAST 07/20/20   Gatha Mayer, MD  fluticasone [facility]) 50 MCG/ACT nasal spray SPRAY 2 SPRAYS INTO EACH NOSTRIL EVERY DAY Patient taking differently: Patient takes as needed 03/08/20   Jacelyn Pi, Lilia Argue, MD  gabapentin (NEURONTIN) 300 MG capsule Take 300 mg by mouth at bedtime. 05/30/21   [provider]  glucose blood (ACCU-CHEK AVIVA PLUS) test strip Check sugar 3-4 times daily  E11.9 12/27/21   Shamleffer, Melanie Crazier, MD  glucose blood (ONETOUCH VERIO) test strip Check blood glucose 3-4 x day. Dx E11.9,  E11.649, z79.4, 11/06/20   Maximiano Coss, NP  Insulin Pen Needle 32G X 4 MM MISC 1 Device by Does not apply route in the morning, at noon, in the evening, and at bedtime. 05/31/21   Shamleffer, Melanie Crazier, MD  Lancets (ACCU-CHEK MULTICLIX) lancets Check sugar 3-4 times daily  E11.9 02/13/22   Shamleffer, Melanie Crazier, MD  Lancets [facility] DELICA PLUS TYOMAY04H) MISC 1 each by Does not apply route 3 (three) times daily. Dx E11.65, z79.4 01/24/19   Jacelyn Pi, Lilia Argue, MD  lidocaine (LIDODERM) 5 % Place 1 patch onto the skin daily. Remove & Discard patch within 12 hours or as directed by MD 02/12/21   Loni Beckwith, PA-C  lidocaine (XYLOCAINE) 2 % solution Use as directed 15 mLs in the mouth or throat as needed for mouth pain. 03/09/20   Gatha Mayer, MD  metoCLOPramide (REGLAN) 10 MG tablet Take 1 tablet (10 mg total) by mouth 4 (four) times daily. 08/05/21   Willia Craze, NP  NONFORMULARY OR COMPOUNDED ITEM Swish and spit 5 mLs as needed (ulcer flare up in mouth). Magic mouthwash with lidocaine disp#150 ml 05/04/20   Gatha Mayer, MD  Olopatadine HCl 0.2 % SOLN Apply 1 drop to eye daily as needed. 01/01/22   Clemon Chambers, MD  ondansetron (ZOFRAN ODT) 4 MG disintegrating tablet Take 1 tablet (4 mg total) by mouth every 8 (eight) hours as needed for nausea or vomiting. 05/08/20   Long, Wonda Olds, MD  phenazopyridine (PYRIDIUM) 200 MG tablet Take 1 tablet (200 mg total) by mouth 3 (three) times daily as needed for pain. 04/02/21   Volney American, PA-C  prochlorperazine (COMPAZINE) 25 MG suppository Place 1 suppository (25 mg total) rectally every 12 (twelve) hours as needed for nausea or vomiting. 07/16/21   Willia Craze, NP  saccharomyces boulardii (FLORASTOR) 250 MG capsule See admin instructions. 06/24/21   [provider]  senna-docusate (SENOKOT-S) 8.6-50 MG tablet Take 1 tablet by mouth at bedtime as needed for mild constipation or moderate constipation. 02/11/21    Long, Wonda Olds, MD  triamcinolone cream (KENALOG) 0.1 % Apply 1 application topically 2 (two) times daily. 04/19/20   Daleen Squibb, MD    Family History Family History  Problem Relation Age of Onset   Stroke Mother    Diabetes Mother    Hypertension Mother    Lung cancer Mother    Sickle cell trait Mother    Sickle cell trait Father    Sickle cell anemia Sister 43   Sickle cell anemia Sister    Other Sister        accident and blood clot formed   Diabetes Maternal Aunt    Lung cancer Maternal Aunt    Lung cancer Maternal Aunt    Lung cancer Maternal Aunt    Lung cancer Maternal Grandmother    Colon cancer Cousin 30   Pancreatic cancer Neg Hx     Social History Social History   Tobacco Use   Smoking status: Former    Types: Cigarettes    Quit date: 10/13/2006    Years since quitting: 15.5   Smokeless tobacco: Never  Vaping Use   Vaping Use: Never used  Substance Use Topics   Alcohol use: Yes    Comment: occasionally   Drug use: No     Allergies   Sulfa antibiotics, Sulfamethoxazole-trimethoprim, Codeine, and Latex   Review of Systems Review of Systems  Constitutional:  Positive for fever.     Physical Exam Triage Vital Signs ED Triage Vitals  Enc Vitals Group     BP 04/22/22 1739 (!) 155/91     Pulse Rate 04/22/22 1739 (!) 105     Resp 04/22/22 1739 20     Temp 04/22/22 1739 99.5 F (37.5 C)     Temp Source 04/22/22 1739 Oral     SpO2 04/22/22 1739 100 %     Weight --      Height --  Head Circumference --      Peak Flow --      Pain Score 04/22/22 1740 10     Pain Loc --      Pain Edu? --      Excl. in [place]? --    No data found.  Updated Vital Signs BP (!) 155/91   Pulse (!) 105   Temp 99.5 F (37.5 C) (Oral)   Resp 20   SpO2 100%   Visual Acuity Right Eye Distance:   Left Eye Distance:   Bilateral Distance:    Right Eye Near:   Left Eye Near:    Bilateral Near:     Physical Exam Vitals reviewed.  Constitutional:       General: She is not in acute distress.    Appearance: She is not ill-appearing, toxic-appearing or diaphoretic.  HENT:     Right Ear: Tympanic membrane and ear canal normal.     Left Ear: Tympanic membrane and ear canal normal.     Nose: Congestion present.     Mouth/Throat:     Mouth: Mucous membranes are moist.     Pharynx: No oropharyngeal exudate or posterior oropharyngeal erythema.  Eyes:     Extraocular Movements: Extraocular movements intact.     Conjunctiva/sclera: Conjunctivae normal.     Pupils: Pupils are equal, round, and reactive to light.  Cardiovascular:     Rate and Rhythm: Normal rate and regular rhythm.     Heart sounds: No murmur heard. Pulmonary:     Effort: No respiratory distress.     Breath sounds: Normal breath sounds. No stridor. No wheezing, rhonchi or rales.     Comments: Expiratory phase is reduced just a little Musculoskeletal:     Cervical back: Neck supple.  Lymphadenopathy:     Cervical: No cervical adenopathy.  Skin:    Capillary Refill: Capillary refill takes less than 2 seconds.     Coloration: Skin is not jaundiced or pale.  Neurological:     General: No focal deficit present.     Mental Status: She is alert and oriented to person, place, and time.  Psychiatric:        Behavior: Behavior normal.      UC Treatments / Results  Labs (all labs ordered are listed, but only abnormal results are displayed) Labs Reviewed  SARS CORONAVIRUS 2 (TAT 6-24 HRS)  POC INFLUENZA A AND B ANTIGEN (URGENT CARE ONLY)    EKG   Radiology DG Chest 2 View  Result Date: 04/22/2022 CLINICAL DATA:  Cough and fever EXAM: CHEST - 2 VIEW COMPARISON:  Chest x-ray dated March 16, 2022 FINDINGS: The heart size and mediastinal contours are within normal limits. Low lung volumes with hypoventilatory changes. No focal consolidation. Arms down position on lateral view somewhat limits evaluation. The visualized skeletal structures are unremarkable. IMPRESSION: Low  lung volumes with hypoventilatory changes. No focal consolidation. Electronically Signed   By: Yetta Glassman M.D.   On: 04/22/2022 18:15    Procedures Procedures (including critical care time)  Medications Ordered in UC Medications  acetaminophen (TYLENOL) tablet 650 mg (650 mg Oral Given 04/22/22 1753)    Initial Impression / Assessment and Plan / UC Course  I have reviewed the triage vital signs and the nursing notes.  Pertinent labs & imaging results that were available during my care of the patient were reviewed by me and considered in my medical decision making (see chart for details).    Flu test is  negative.  Chest x-ray does not show any infiltrate.  Tessalon Perles sent in for the cough.  She is swabbed for COVID, and if positive this is day 2, and her EGFR is over 60.  She is a candidate for Paxlovid prescription if positive   Not going to do any steroid treatment, as she has such a reaction with high sugars when she takes that ever Final Clinical Impressions(s) / UC Diagnoses   Final diagnoses:  Viral upper respiratory tract infection     Discharge Instructions      Your flu test was negative Your chest x-ray did not show any pneumonia   You have been swabbed for COVID, and the test will result in the next 24 hours. Our staff will call you if positive. If the test is positive, you should quarantine for 5 days from the start of your symptoms  Take benzonatate 100 mg, 1 tab every 8 hours as needed for cough.        ED Prescriptions     Medication Sig Dispense Auth. Provider   benzonatate (TESSALON) 100 MG capsule Take 1 capsule (100 mg total) by mouth 3 (three) times daily as needed for cough. 21 capsule Barrett Henle, MD      PDMP not reviewed this encounter.   Barrett Henle, MD 04/22/22 Tresa Moore

## 2022-04-23 LAB — SARS CORONAVIRUS 2 (TAT 6-24 HRS): SARS Coronavirus 2: NEGATIVE

## 2022-04-27 ENCOUNTER — Encounter (HOSPITAL_COMMUNITY): Payer: Self-pay

## 2022-04-27 ENCOUNTER — Ambulatory Visit (HOSPITAL_COMMUNITY)
Admission: EM | Admit: 2022-04-27 | Discharge: 2022-04-27 | Disposition: A | Discharge status: Home / Self Care | Payer: HMO | Attending: Emergency Medicine | Admitting: Emergency Medicine

## 2022-04-27 DIAGNOSIS — R051 Acute cough: Secondary | ICD-10-CM | POA: Diagnosis not present

## 2022-04-27 DIAGNOSIS — H6691 Otitis media, unspecified, right ear: Secondary | ICD-10-CM | POA: Diagnosis not present

## 2022-04-27 MED ORDER — AMOXICILLIN-POT CLAVULANATE 875-125 MG PO TABS: 1.0000 | ORAL_TABLET | Freq: Two times a day (BID) | ORAL | 0 refills | Status: AC

## 2022-04-27 NOTE — ED Provider Notes (Signed)
[facility]    CSN: [id number] Arrival date & time: 04/27/22  1002     History   Chief Complaint Cough  HPI Beverly Ramirez is a 61 y.o. female.  Presents with 5 day history of cough and congestion. Reports cough is somewhat productive and keeps her up at night. Reports some burning pain in her ears. She denies fever, chills, sore throat, chest pain, shortness of breath, abdominal pain, vomiting/diarrhea.  Has been using her inhaler as needed.  She was seen 8/15 for similar, had a negative chest x-ray and negative COVID.  Was given Tessalon but reports this is not doing much for her.  Had a second negative covid test yesterday.  Past Medical History:  Diagnosis Date   Anemia    Anxiety    Carpal tunnel syndrome    bilateral   Chronic heel pain    Diabetes mellitus without complication (HCC)    DJD (degenerative joint disease) of cervical spine    MRI 2017   Fibromyalgia    Hyperlipidemia    Hypertension    Plantar fasciitis    bilateral   Sickle cell trait (HCC)    Sleep apnea    Tendonitis    left hand/wrist   UC (ulcerative colitis) ([place])     Patient Active Problem List   Diagnosis Date Noted   Allergic conjunctivitis of both eyes 01/01/2022   Seasonal and perennial allergic rhinitis 01/01/2022   Latex allergy 01/01/2022   Allergy to sulfa drugs 01/01/2022   Mild intermittent asthma 01/01/2022   Type 2 diabetes mellitus with hyperglycemia, with long-term current use of insulin ([place]) 05/31/2020   Fibromyalgia 05/31/2020   Mouth ulcers 05/25/2020   Elevated glucose 12/23/2019   Diabetic ketoacidosis without coma associated with type 2 diabetes mellitus ([place]) 12/23/2019   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   De Quervain's tenosynovitis, left 11/16/2018   Bilateral carpal tunnel syndrome 04/13/2018   Leukocytosis 10/04/2017   Hyperlipidemia 08/04/2017   Ulcerative colitis with complication ([place]) 54/27/0623   Plantar fasciitis 05/28/2017    Past  Surgical History:  Procedure Laterality Date   ABDOMINAL HYSTERECTOMY     APPENDECTOMY     CESAREAN SECTION     x4   COLONOSCOPY     Multiple in [place]   ESOPHAGOGASTRODUODENOSCOPY     ROTATOR CUFF REPAIR Left    03/26/2022    OB History     Gravida  4   Para  4   Term      Preterm      AB      Living  4      SAB      IAB      Ectopic      Multiple      Live Births               Home Medications    Prior to Admission medications   Medication Sig Start Date End Date Taking? Authorizing Provider  amoxicillin-clavulanate (AUGMENTIN) 875-125 MG tablet Take 1 tablet by mouth 2 (two) times daily for 7 days. 04/27/22 05/04/22 Yes Arvil Utz, Wells Guiles, PA-C  albuterol (VENTOLIN HFA) 108 (90 Base) MCG/ACT inhaler Inhale 2 puffs into the lungs every 6 (six) hours as needed for wheezing or shortness of breath. 03/13/20   Jacelyn Pi, Lilia Argue, MD  amLODipine (NORVASC) 5 MG tablet TAKE 1 TABLET BY MOUTH EVERY DAY 06/08/20   Jacelyn Pi, Lilia Argue, MD  chlorzoxazone (PARAFON) 500 MG tablet Take 500  mg by mouth 3 (three) times daily as needed. 08/12/21   [provider]  Continuous Blood Gluc Sensor (FREESTYLE LIBRE 2 SENSOR) MISC Change every 14 days 10/11/21   Shamleffer, Melanie Crazier, MD  dapagliflozin propanediol (FARXIGA) 10 MG TABS tablet Take 1 tablet (10 mg total) by mouth daily. 10/26/20   Shamleffer, Melanie Crazier, MD  diazepam (VALIUM) 5 MG tablet SMARTSIG:1 Tablet(s) By Mouth 08/12/21   [provider]  diclofenac Sodium (VOLTAREN) 1 % GEL Apply 4 g topically 4 (four) times daily as needed (pain). 03/16/22   Petrucelli, Samantha R, PA-C  dicyclomine (BENTYL) 20 MG tablet Take 1 tablet (20 mg total) by mouth 3 (three) times daily as needed for spasms (abdominal cramping). 10/18/20   Wendie Agreste, MD  diphenoxylate-atropine (LOMOTIL) 2.5-0.025 MG tablet Take 1 tablet by mouth 4 (four) times daily as needed for diarrhea or loose stools. 04/27/20    Gatha Mayer, MD  DULoxetine (CYMBALTA) 60 MG capsule TAKE 1 CAPSULE BY MOUTH EVERY DAY 03/20/20   Jacelyn Pi, Lilia Argue, MD  escitalopram (LEXAPRO) 10 MG tablet Take 10 mg by mouth daily. 08/07/21   [provider]  esomeprazole (NEXIUM) 40 MG capsule TAKE 1 CAPSULE BY MOUTH EVERY DAY BEFORE BREAKFAST 07/20/20   Gatha Mayer, MD  fluticasone [facility]) 50 MCG/ACT nasal spray SPRAY 2 SPRAYS INTO EACH NOSTRIL EVERY DAY Patient taking differently: Patient takes as needed 03/08/20   Jacelyn Pi, Lilia Argue, MD  gabapentin (NEURONTIN) 300 MG capsule Take 300 mg by mouth at bedtime. 05/30/21   [provider]  glucose blood (ACCU-CHEK AVIVA PLUS) test strip Check sugar 3-4 times daily  E11.9 12/27/21   Shamleffer, Melanie Crazier, MD  glucose blood (ONETOUCH VERIO) test strip Check blood glucose 3-4 x day. Dx E11.9, E11.649, z79.4, 11/06/20   Maximiano Coss, NP  insulin aspart (NOVOLOG FLEXPEN) 100 UNIT/ML FlexPen Max Daily 50 units 05/31/21   Shamleffer, Melanie Crazier, MD  Insulin Glargine (BASAGLAR KWIKPEN) 100 UNIT/ML Inject 30 Units into the skin at bedtime. Patient taking differently: Inject 17 Units into the skin at bedtime. 05/31/21   Shamleffer, Melanie Crazier, MD  Insulin Pen Needle 32G X 4 MM MISC 1 Device by Does not apply route in the morning, at noon, in the evening, and at bedtime. 05/31/21   Shamleffer, Melanie Crazier, MD  Lancets (ACCU-CHEK MULTICLIX) lancets Check sugar 3-4 times daily  E11.9 02/13/22   Shamleffer, Melanie Crazier, MD  Lancets [facility] DELICA PLUS [id number]) MISC 1 each by Does not apply route 3 (three) times daily. Dx E11.65, z79.4 01/24/19   Jacelyn Pi, Lilia Argue, MD  lidocaine (LIDODERM) 5 % Place 1 patch onto the skin daily. Remove & Discard patch within 12 hours or as directed by MD 02/12/21   Loni Beckwith, PA-C  lidocaine (XYLOCAINE) 2 % solution Use as directed 15 mLs in the mouth or throat as needed for mouth pain. 03/09/20   Gatha Mayer, MD   lisinopril (ZESTRIL) 10 MG tablet Take 10 mg by mouth daily. 08/07/21   [provider]  mesalamine (LIALDA) 1.2 g EC tablet Take 2 tablets (2.4 g total) by mouth 2 (two) times daily. 04/28/20 04/22/22  Gatha Mayer, MD  methocarbamol (ROBAXIN) 500 MG tablet Take 1-2 tablets (500-1,000 mg total) by mouth every 8 (eight) hours as needed for muscle spasms. 03/16/22   Petrucelli, Samantha R, PA-C  metoCLOPramide (REGLAN) 10 MG tablet Take 1 tablet (10 mg total) by mouth 4 (  four) times daily. 08/05/21   Willia Craze, NP  montelukast (SINGULAIR) 10 MG tablet Take 1 tablet (10 mg total) by mouth at bedtime. 01/01/22   Clemon Chambers, MD  Multiple Vitamins-Minerals (WOMENS 50+ [place] VITAMIN/MIN PO) Take by mouth.    [provider]  NONFORMULARY OR COMPOUNDED ITEM Swish and spit 5 mLs as needed (ulcer flare up in mouth). Magic mouthwash with lidocaine disp#150 ml 05/04/20   Gatha Mayer, MD  Olopatadine HCl 0.2 % SOLN Apply 1 drop to eye daily as needed. 01/01/22   Clemon Chambers, MD  ondansetron (ZOFRAN ODT) 4 MG disintegrating tablet Take 1 tablet (4 mg total) by mouth every 8 (eight) hours as needed for nausea or vomiting. 05/08/20   Long, Wonda Olds, MD  phenazopyridine (PYRIDIUM) 200 MG tablet Take 1 tablet (200 mg total) by mouth 3 (three) times daily as needed for pain. 04/02/21   Volney American, PA-C  prochlorperazine (COMPAZINE) 25 MG suppository Place 1 suppository (25 mg total) rectally every 12 (twelve) hours as needed for nausea or vomiting. 07/16/21   Willia Craze, NP  rosuvastatin (CRESTOR) 40 MG tablet 1 tablet    [provider]  saccharomyces boulardii (FLORASTOR) 250 MG capsule See admin instructions. 06/24/21   [provider]  senna-docusate (SENOKOT-S) 8.6-50 MG tablet Take 1 tablet by mouth at bedtime as needed for mild constipation or moderate constipation. 02/11/21   Long, Wonda Olds, MD  triamcinolone cream (KENALOG) 0.1 % Apply 1  application topically 2 (two) times daily. 04/19/20   Daleen Squibb, MD    Family History Family History  Problem Relation Age of Onset   Stroke Mother    Diabetes Mother    Hypertension Mother    Lung cancer Mother    Sickle cell trait Mother    Sickle cell trait Father    Sickle cell anemia Sister 43   Sickle cell anemia Sister    Other Sister        accident and blood clot formed   Diabetes Maternal Aunt    Lung cancer Maternal Aunt    Lung cancer Maternal Aunt    Lung cancer Maternal Aunt    Lung cancer Maternal Grandmother    Colon cancer Cousin 30   Pancreatic cancer Neg Hx     Social History Social History   Tobacco Use   Smoking status: Former    Types: Cigarettes    Quit date: 10/13/2006    Years since quitting: 15.5   Smokeless tobacco: Never  Vaping Use   Vaping Use: Never used  Substance Use Topics   Alcohol use: Yes    Comment: occasionally   Drug use: No     Allergies   Sulfa antibiotics, Sulfamethoxazole-trimethoprim, Codeine, and Latex   Review of Systems Review of Systems Per HPI  Physical Exam Triage Vital Signs ED Triage Vitals [04/27/22 1021]  Enc Vitals Group     BP 125/85     Pulse Rate 82     Resp 18     Temp 98.9 F (37.2 C)     Temp Source Oral     SpO2 98 %     Weight      Height      Head Circumference      Peak Flow      Pain Score      Pain Loc      Pain Edu?      Excl. in [place]?  No data found.  Updated Vital Signs BP 125/85 (BP Location: Left Arm)   Pulse 82   Temp 98.9 F (37.2 C) (Oral)   Resp 18   SpO2 98%   Physical Exam Vitals and nursing note reviewed.  Constitutional:      General: She is not in acute distress. HENT:     Right Ear: External ear normal. A middle ear effusion is present. Tympanic membrane is not perforated.     Left Ear: External ear normal. Tympanic membrane is not perforated.     Nose: Nose normal.     Mouth/Throat:     Pharynx: Oropharynx is clear. No posterior  oropharyngeal erythema.  Eyes:     Extraocular Movements: Extraocular movements intact.     Conjunctiva/sclera: Conjunctivae normal.     Pupils: Pupils are equal, round, and reactive to light.  Cardiovascular:     Rate and Rhythm: Normal rate and regular rhythm.     Pulses: Normal pulses.     Heart sounds: Normal heart sounds.  Pulmonary:     Effort: Pulmonary effort is normal. No respiratory distress.     Breath sounds: Normal breath sounds. No wheezing, rhonchi or rales.  Abdominal:     General: Abdomen is flat.     Palpations: Abdomen is soft.     Tenderness: There is no abdominal tenderness.  Lymphadenopathy:     Cervical: No cervical adenopathy.  Neurological:     Mental Status: She is alert and oriented to person, place, and time.      UC Treatments / Results  Labs (all labs ordered are listed, but only abnormal results are displayed) Labs Reviewed - No data to display  EKG   Radiology No results found.  Procedures Procedures (including critical care time)  Medications Ordered in UC Medications - No data to display  Initial Impression / Assessment and Plan / UC Course  I have reviewed the triage vital signs and the nursing notes.  Pertinent labs & imaging results that were available during my care of the patient were reviewed by me and considered in my medical decision making (see chart for details).  Chest x-ray not indicated as she just had a negative one performed 5 days ago. Cloudy fluid behind right TM, will treat for otitis media, also cover for possible sinobronchial etiology with Augmentin twice daily for 7 days.  Continue Tessalon Per[place] for cough.  Increase fluid intake.  Strict ED precautions.  Patient understands if she does not improve, she needs to go to the emergency department.  Agrees to plan  Final Clinical Impressions(s) / UC Diagnoses   Final diagnoses:  Right otitis media, unspecified otitis media type  Acute cough     Discharge  Instructions      Please take medication as prescribed. Take with food to avoid upset stomach.  Continue tessalon 3x daily as needed.  Please go to the emergency department if symptoms worsen.     ED Prescriptions     Medication Sig Dispense Auth. Provider   amoxicillin-clavulanate (AUGMENTIN) 875-125 MG tablet Take 1 tablet by mouth 2 (two) times daily for 7 days. 14 tablet Jordany Russett, Wells Guiles, PA-C      PDMP not reviewed this encounter.   Eleanna Theilen, Vernice Jefferson 04/27/22 1138

## 2022-04-27 NOTE — Discharge Instructions (Addendum)
Please take medication as prescribed. Take with food to avoid upset stomach.  Continue tessalon 3x daily as needed.  Please go to the emergency department if symptoms worsen.

## 2022-04-27 NOTE — ED Triage Notes (Signed)
C/o cough and congestion x 2 days. Pt reports she was tested for covid several days ago and it was negative.

## 2022-04-29 ENCOUNTER — Other Ambulatory Visit: Payer: Self-pay | Admitting: Obstetrics & Gynecology

## 2022-04-29 DIAGNOSIS — Z1231 Encounter for screening mammogram for malignant neoplasm of breast: Secondary | ICD-10-CM

## 2022-04-29 DIAGNOSIS — M25512 Pain in left shoulder: Secondary | ICD-10-CM | POA: Diagnosis not present

## 2022-05-01 DIAGNOSIS — M25512 Pain in left shoulder: Secondary | ICD-10-CM | POA: Diagnosis not present

## 2022-05-06 DIAGNOSIS — M25512 Pain in left shoulder: Secondary | ICD-10-CM | POA: Diagnosis not present

## 2022-05-07 DIAGNOSIS — E1169 Type 2 diabetes mellitus with other specified complication: Secondary | ICD-10-CM | POA: Diagnosis not present

## 2022-05-07 DIAGNOSIS — H66013 Acute suppurative otitis media with spontaneous rupture of ear drum, bilateral: Secondary | ICD-10-CM | POA: Diagnosis not present

## 2022-05-07 DIAGNOSIS — F419 Anxiety disorder, unspecified: Secondary | ICD-10-CM | POA: Diagnosis not present

## 2022-05-07 DIAGNOSIS — Z8616 Personal history of COVID-19: Secondary | ICD-10-CM | POA: Diagnosis not present

## 2022-05-08 DIAGNOSIS — M25512 Pain in left shoulder: Secondary | ICD-10-CM | POA: Diagnosis not present

## 2022-05-14 DIAGNOSIS — M25512 Pain in left shoulder: Secondary | ICD-10-CM | POA: Diagnosis not present

## 2022-05-16 ENCOUNTER — Ambulatory Visit
Admission: RE | Admit: 2022-05-16 | Discharge: 2022-05-16 | Disposition: A | Discharge status: Home / Self Care | Payer: HMO | Source: Ambulatory Visit | Attending: Obstetrics & Gynecology | Admitting: Obstetrics & Gynecology

## 2022-05-16 DIAGNOSIS — M25512 Pain in left shoulder: Secondary | ICD-10-CM | POA: Diagnosis not present

## 2022-05-16 DIAGNOSIS — Z1231 Encounter for screening mammogram for malignant neoplasm of breast: Secondary | ICD-10-CM

## 2022-05-27 DIAGNOSIS — M25512 Pain in left shoulder: Secondary | ICD-10-CM | POA: Diagnosis not present

## 2022-05-29 ENCOUNTER — Ambulatory Visit (INDEPENDENT_AMBULATORY_CARE_PROVIDER_SITE_OTHER): Payer: HMO | Admitting: Obstetrics & Gynecology

## 2022-05-29 ENCOUNTER — Encounter: Payer: Self-pay | Admitting: Obstetrics & Gynecology

## 2022-05-29 VITALS — BP 118/74 | HR 82 | Ht 62.75 in | Wt 160.0 lb

## 2022-05-29 DIAGNOSIS — Z780 Asymptomatic menopausal state: Secondary | ICD-10-CM

## 2022-05-29 DIAGNOSIS — Z9189 Other specified personal risk factors, not elsewhere classified: Secondary | ICD-10-CM

## 2022-05-29 DIAGNOSIS — Z01419 Encounter for gynecological examination (general) (routine) without abnormal findings: Secondary | ICD-10-CM

## 2022-05-29 NOTE — Progress Notes (Signed)
Beverly Ramirez [birth year] [id number]   History:    61 y.o.  [id number] Married   RP:  Established patient presenting for annual gyn exam    HPI:  S/P Supracervical Hysterectomy for Fibroids.  Postmenopause, well on no HRT.  No pelvic pain.  No pain with IC.  Pap Neg in 12/2019.  No h/o abnormal Pap.  Repeat Pap at 4 years. Ulcerative Colitis under control on Meds.  Urine normal.  BMs normal.  Colono 08/2021.  Breasts wnl.  Mammo scheduled for 06/2022 (Had to postpone because of left rotator cuff surgery doing PT). BMI 28.57.  BD normal 07/2019, will repeat at age 21.  DM on Insulin followed by Endocrino and Nutritionist.  Health labs with Fam MD.   Past medical history,surgical history, family history and social history were all reviewed and documented in the EPIC chart.  Gynecologic History No LMP recorded. Patient has had a hysterectomy.  Obstetric History OB History  Gravida Para Term Preterm AB Living  4 4 4     4   SAB IAB Ectopic Multiple Live Births               # Outcome Date GA Lbr Len/2nd Weight Sex Delivery Anes PTL Lv  4 Term           3 Term           2 Term           1 Term              ROS: A ROS was performed and pertinent positives and negatives are included in the history. GENERAL: No fevers or chills. HEENT: No change in vision, no earache, sore throat or sinus congestion. NECK: No pain or stiffness. CARDIOVASCULAR: No chest pain or pressure. No palpitations. PULMONARY: No shortness of breath, cough or wheeze. GASTROINTESTINAL: No abdominal pain, nausea, vomiting or diarrhea, melena or bright red blood per rectum. GENITOURINARY: No urinary frequency, urgency, hesitancy or dysuria. MUSCULOSKELETAL: No joint or muscle pain, no back pain, no recent trauma. DERMATOLOGIC: No rash, no itching, no lesions. ENDOCRINE: No polyuria, polydipsia, no heat or cold intolerance. No recent change in weight. HEMATOLOGICAL: No anemia or easy bruising or bleeding. NEUROLOGIC: No headache,  seizures, numbness, tingling or weakness. PSYCHIATRIC: No depression, no loss of interest in normal activity or change in sleep pattern.     Exam:  BP 118/74   Pulse 82   Ht 5' 2.75" (1.594 m)   Wt 160 lb (72.6 kg)   SpO2 99%   BMI 28.57 kg/m   Body mass index is 28.57 kg/m.  General appearance : Well developed well nourished female. No acute distress HEENT: Eyes: no retinal hemorrhage or exudates,  Neck supple, trachea midline, no carotid bruits, no thyroidmegaly Lungs: Clear to auscultation, no rhonchi or wheezes, or rib retractions  Heart: Regular rate and rhythm, no murmurs or gallops Breast:Examined in sitting and supine position were symmetrical in appearance, no palpable masses or tenderness,  no skin retraction, no nipple inversion, no nipple discharge, no skin discoloration, no axillary or supraclavicular lymphadenopathy Abdomen: no palpable masses or tenderness, no rebound or guarding Extremities: no edema or skin discoloration or tenderness  Pelvic: Vulva: Normal             Vagina: No gross lesions or discharge  Cervix: No gross lesions or discharge  Uterus  AV, normal size, shape and consistency, non-tender and mobile  Adnexa  Without masses or tenderness  Anus: Normal   Assessment/Plan:  61 y.o. female for annual exam   1. Well female exam with routine gynecological exam S/P Supracervical Hysterectomy for Fibroids.  Postmenopause, well on no HRT.  No pelvic pain.  No pain with IC.  Pap Neg in 12/2019.  No h/o abnormal Pap.  Repeat Pap at 4 years. Ulcerative Colitis under control on Meds.  Urine normal.  BMs normal.  Colono 08/2021.  Breasts wnl.  Mammo scheduled for 06/2022 (Had to postpone because of left rotator cuff surgery doing PT). BMI 28.57.  BD normal 07/2019, will repeat at age 71.  DM on Insulin followed by Endocrino and Nutritionist.  Health labs with Fam MD.  2. Postmenopause S/P Supracervical Hysterectomy for Fibroids.  Postmenopause, well on no HRT.   No pelvic pain.  No pain with IC. BD normal 07/2019, will repeat at age 75.   3. Other specified personal risk factors, not elsewhere classified  Other orders - ezetimibe (ZETIA) 10 MG tablet; Take 10 mg by mouth daily. - loratadine (CLARITIN) 10 MG tablet; Take 10 mg by mouth daily. - meclizine (ANTIVERT) 25 MG tablet; TAKE 1 TABLET BY MOUTH 3 TIMES A DAY AS NEEDED FOR DIZZINESS - escitalopram (LEXAPRO) 20 MG tablet; Take 20 mg by mouth daily.   Princess Bruins MD, 11:28 AM 05/29/2022

## 2022-06-03 DIAGNOSIS — M25612 Stiffness of left shoulder, not elsewhere classified: Secondary | ICD-10-CM | POA: Diagnosis not present

## 2022-06-03 DIAGNOSIS — M25512 Pain in left shoulder: Secondary | ICD-10-CM | POA: Diagnosis not present

## 2022-06-03 DIAGNOSIS — Z4789 Encounter for other orthopedic aftercare: Secondary | ICD-10-CM | POA: Diagnosis not present

## 2022-06-04 ENCOUNTER — Telehealth: Payer: Self-pay

## 2022-06-04 NOTE — Telephone Encounter (Signed)
Patient received a steroid shot  and her sugars have been evaluated. Patient has been taking medication as prescribed     9/26  4pm  98  6:30pm  256  Took 25 units of Novolog   9:30pm 253   9/25    2;01am  243 Took 20 units Basaglar   Noon  250  Took 25 Novolog    4pm  92   7:30pm  330   9/24    12:24pm  106  7pm   208  Took 25 units Novolog  8pm  147

## 2022-06-04 NOTE — Telephone Encounter (Signed)
Patient advised on medication changes and sliding scale information. Patient verbalized understanding

## 2022-06-05 DIAGNOSIS — M25612 Stiffness of left shoulder, not elsewhere classified: Secondary | ICD-10-CM | POA: Diagnosis not present

## 2022-06-05 DIAGNOSIS — M25512 Pain in left shoulder: Secondary | ICD-10-CM | POA: Diagnosis not present

## 2022-06-10 DIAGNOSIS — M25612 Stiffness of left shoulder, not elsewhere classified: Secondary | ICD-10-CM | POA: Diagnosis not present

## 2022-06-10 DIAGNOSIS — M25512 Pain in left shoulder: Secondary | ICD-10-CM | POA: Diagnosis not present

## 2022-06-12 DIAGNOSIS — M25512 Pain in left shoulder: Secondary | ICD-10-CM | POA: Diagnosis not present

## 2022-06-12 DIAGNOSIS — M25612 Stiffness of left shoulder, not elsewhere classified: Secondary | ICD-10-CM | POA: Diagnosis not present

## 2022-06-17 DIAGNOSIS — M25512 Pain in left shoulder: Secondary | ICD-10-CM | POA: Diagnosis not present

## 2022-06-17 DIAGNOSIS — M25612 Stiffness of left shoulder, not elsewhere classified: Secondary | ICD-10-CM | POA: Diagnosis not present

## 2022-06-19 ENCOUNTER — Ambulatory Visit: Payer: HMO

## 2022-06-25 ENCOUNTER — Ambulatory Visit
Admission: RE | Admit: 2022-06-25 | Discharge: 2022-06-25 | Disposition: A | Discharge status: Home / Self Care | Payer: HMO | Source: Ambulatory Visit | Attending: Obstetrics & Gynecology | Admitting: Obstetrics & Gynecology

## 2022-06-30 ENCOUNTER — Encounter: Payer: Self-pay | Admitting: Neurology

## 2022-06-30 ENCOUNTER — Ambulatory Visit (INDEPENDENT_AMBULATORY_CARE_PROVIDER_SITE_OTHER): Payer: HMO | Admitting: Neurology

## 2022-06-30 VITALS — BP 131/87 | HR 52 | Ht 62.0 in | Wt 163.6 lb

## 2022-06-30 DIAGNOSIS — R519 Headache, unspecified: Secondary | ICD-10-CM | POA: Diagnosis not present

## 2022-06-30 DIAGNOSIS — G4733 Obstructive sleep apnea (adult) (pediatric): Secondary | ICD-10-CM
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
## 2022-06-30 NOTE — Progress Notes (Addendum)
Beverly Ramirez, Beverly Ramirez, CMA  Darlina Guys; Beverly, [name]; [name] New orders have been placed for the above pt, DOB: [birth year]  Thanks     New, Kristine Garbe; [name], Beverly Ramirez, CMA Hello [name],  Order has been received. However, I am pretty sure this will require a new sleep study. Patient was scheduled and no showed for her appointment back in 2021 and it doesn't look like she was trying  / using any other devise during this time.  I have printed the docs and sent it for review. We will let you know if something else is needed or a new study is required.  -Thank you, Demetrius Charity

## 2022-06-30 NOTE — Patient Instructions (Signed)
It was nice to see you again.  As discussed for your neck pain, I recommend you follow up with your neurosurgeon.  For your shoulder pain, please keep your appointment with your orthopedic surgeon.  We will set you up at home with a so called autoPAP machine for treatment of your obstructive sleep apnea. Your should hear back in about 1-2 weeks from your DME company.

## 2022-07-04 ENCOUNTER — Ambulatory Visit (INDEPENDENT_AMBULATORY_CARE_PROVIDER_SITE_OTHER): Payer: HMO | Admitting: Internal Medicine

## 2022-07-04 ENCOUNTER — Encounter: Payer: Self-pay | Admitting: Internal Medicine

## 2022-07-04 VITALS — BP 134/82 | HR 71 | Ht 62.0 in | Wt 163.4 lb

## 2022-07-04 DIAGNOSIS — E1165 Type 2 diabetes mellitus with hyperglycemia: Secondary | ICD-10-CM

## 2022-07-04 DIAGNOSIS — Z794 Long term (current) use of insulin: Secondary | ICD-10-CM | POA: Diagnosis not present

## 2022-07-04 LAB — POCT GLYCOSYLATED HEMOGLOBIN (HGB A1C): Hemoglobin A1C: 8.3 % — AB (ref 4.0–5.6)

## 2022-07-04 MED ORDER — DEXCOM G7 SENSOR MISC: 1.0000 | 3 refills | Status: DC

## 2022-07-04 NOTE — Patient Instructions (Addendum)
-   Take  Basaglar 26  units daily  - Increase Novolog 22  units with meals  -Novolog correctional insulin: ADD extra units on insulin to your meal-time Novolog dose if your blood sugars are higher than 160. USE BEFORE YOU EAT   Blood sugar before meal Number of units to inject  Less than 160 0 unit  161 - 190 1 units  191 - 220 2 units  221 -  250 3 units  251 -  280 4 units  281 -  310 5 units    HOW TO TREAT LOW BLOOD SUGARS (Blood sugar LESS THAN 70 MG/DL) Please follow the RULE OF 15 for the treatment of hypoglycemia treatment (when your (blood sugars are less than 70 mg/dL)   STEP 1: Take 15 grams of carbohydrates when your blood sugar is low, which includes:  4 GLUCOSE TABS  OR 4 OZ OF JUICE OR REGULAR SODA OR ONE TUBE OF GLUCOSE GEL    STEP 2: RECHECK blood sugar in 15 MINUTES STEP 3: If your blood sugar is still low at the 15 minute recheck --> then, go back to STEP 1 and treat AGAIN with another 15 grams of carbohydrates

## 2022-07-04 NOTE — Progress Notes (Unsigned)
Name: Beverly Ramirez  Age/ Sex: 61 y.o., female   MRN/ DOB: [id number], [birth year]     PCP: Leeroy Cha, MD   Reason for Endocrinology Evaluation: Type 2 Diabetes Mellitus  Initial Endocrine Consultative Visit: 07/23/2020    PATIENT IDENTIFIER: [name] is a 61 y.o. female with a past medical history of T2DM, OSA, fibromyalgia and UC. The patient has followed with Endocrinology clinic since 07/23/2020 for consultative assistance with management of her diabetes.  DIABETIC HISTORY:  [name] was diagnosed with DM in 2019.Has been on insulin intermittently while on prednisone,  Metformin- too sick. Marland Kitchen Her hemoglobin A1c has ranged from 7.1%in 2021 , peaking at 9.5% in 2020    On her initial visit to our clinic she had an A1c of 7.2 % she was on basal insulin and Novolog, We added farxiga    SUBJECTIVE:   During the last visit (08/28/2021): A1c 7.6 % Adjusted  MDI regimen and continued [place]      Today (07/04/2022): [name] is here for a follow up diabetes management.  She checks her blood sugars 1-2 times a day. She has had one episode of hypoglycemia in the morning    She has left arm sx in 03/2022 She received intra-articular injection 3 weeks  She is in the donut hole for Farxiga $1000   Denies nausea, vomiting or diarrhea     HOME DIABETES REGIMEN:  Farxiga 10 mg, 1 tablet with breakfast - not taking  Basaglar 28 units daily Novolog 20 units TIDQAC CF : Novolog ( BG -130/30)     Statin: yes ACE-I/ARB: no    METER DOWNLOAD SUMMARY: 10/14-10/27/2023 Average Number Tests/Day = 1 Overall Mean FS Glucose = 147 Standard Deviation = ***  BG Ranges: Low = 50 High = 233  BG Target % Results: % In target = 78 % Over target = 17 % Under target = 6  Hypoglycemic Events/30 Days: BG < 50 = 0 Episodes of symptomatic severe hypoglycemia = 0     DIABETIC COMPLICATIONS: Microvascular complications:  Neuropathy Denies: CKD,  retinopathy Last Eye Exam: Completed 2022  Macrovascular complications:   Denies: CAD, CVA, PVD   HISTORY:  Past Medical History:  Past Medical History:  Diagnosis Date   Anemia    Anxiety    Carpal tunnel syndrome    bilateral   Chronic heel pain    Diabetes mellitus without complication (HCC)    DJD (degenerative joint disease) of cervical spine    MRI 2017   Fibromyalgia    Hyperlipidemia    Hypertension    Plantar fasciitis    bilateral   Sickle cell trait (HCC)    Sleep apnea    Tendonitis    left hand/wrist   UC (ulcerative colitis) ([place])    Past Surgical History:  Past Surgical History:  Procedure Laterality Date   ABDOMINAL HYSTERECTOMY     APPENDECTOMY     CESAREAN SECTION     x4   COLONOSCOPY     Multiple in [place]   ESOPHAGOGASTRODUODENOSCOPY     ROTATOR CUFF REPAIR Left    03/26/2022   Social History:  reports that she quit smoking about 15 years ago. Her smoking use included cigarettes. She has never used smokeless tobacco. She reports current alcohol use. She reports that she does not use drugs. Family History:  Family History  Problem Relation Age of Onset   Stroke Mother    Diabetes Mother    Hypertension Mother  Lung cancer Mother    Sickle cell trait Mother    Sickle cell trait Father    Sickle cell anemia Sister 70   Sickle cell anemia Sister    Other Sister        accident and blood clot formed   Diabetes Maternal Aunt    Lung cancer Maternal Aunt    Lung cancer Maternal Aunt    Lung cancer Maternal Aunt    Lung cancer Maternal Grandmother    Colon cancer Cousin 30   Pancreatic cancer Neg Hx    Sleep apnea Neg Hx    Headache Neg Hx      HOME MEDICATIONS: Allergies as of 07/04/2022       Reactions   Sulfa Antibiotics Hives   Sulfamethoxazole-trimethoprim Hives, Itching   Codeine Itching, Rash, Hives, Other (See Comments)   Latex Hives   With the power        Medication List        Accurate as of July 04, 2022  1:59 PM. If you have any questions, ask your nurse or doctor.          STOP taking these medications    meloxicam 7.5 MG tablet Commonly known as: MOBIC Stopped by: Beverly Sciara, MD       TAKE these medications    albuterol 108 (90 Base) MCG/ACT inhaler Commonly known as: VENTOLIN HFA Inhale 2 puffs into the lungs every 6 (six) hours as needed for wheezing or shortness of breath.   amLODipine 5 MG tablet Commonly known as: NORVASC TAKE 1 TABLET BY MOUTH EVERY DAY   Basaglar KwikPen 100 UNIT/ML Inject 30 Units into the skin at bedtime. What changed: how much to take   chlorzoxazone 500 MG tablet Commonly known as: PARAFON Take 500 mg by mouth 3 (three) times daily as needed.   dapagliflozin propanediol 10 MG Tabs tablet Commonly known as: Farxiga Take 1 tablet (10 mg total) by mouth daily.   Dexcom G7 Sensor Misc 1 Device by Does not apply route as directed. What changed:  how much to take how to take this when to take this additional instructions Changed by: Beverly Sciara, MD   diclofenac Sodium 1 % Gel Commonly known as: VOLTAREN Apply 4 g topically 4 (four) times daily as needed (pain).   dicyclomine 20 MG tablet Commonly known as: BENTYL Take 1 tablet (20 mg total) by mouth 3 (three) times daily as needed for spasms (abdominal cramping).   DULoxetine 60 MG capsule Commonly known as: CYMBALTA TAKE 1 CAPSULE BY MOUTH EVERY DAY   escitalopram 20 MG tablet Commonly known as: LEXAPRO Take 20 mg by mouth daily.   esomeprazole 40 MG capsule Commonly known as: NEXIUM TAKE 1 CAPSULE BY MOUTH EVERY DAY BEFORE BREAKFAST What changed: See the new instructions.   ezetimibe 10 MG tablet Commonly known as: ZETIA Take 10 mg by mouth daily.   fluticasone 50 MCG/ACT nasal spray Commonly known as: FLONASE SPRAY 2 SPRAYS INTO EACH NOSTRIL EVERY DAY What changed: See the new instructions.   gabapentin 300 MG capsule Commonly known as:  NEURONTIN Take 300 mg by mouth at bedtime.   Insulin Pen Needle 32G X 4 MM Misc 1 Device by Does not apply route in the morning, at noon, in the evening, and at bedtime.   lidocaine 2 % solution Commonly known as: XYLOCAINE Use as directed 15 mLs in the mouth or throat as needed for mouth pain.   lisinopril  10 MG tablet Commonly known as: ZESTRIL Take 10 mg by mouth daily.   loratadine 10 MG tablet Commonly known as: CLARITIN Take 10 mg by mouth daily.   meclizine 25 MG tablet Commonly known as: ANTIVERT TAKE 1 TABLET BY MOUTH 3 TIMES A DAY AS NEEDED FOR DIZZINESS   mesalamine 1.2 g EC tablet Commonly known as: LIALDA Take 2 tablets (2.4 g total) by mouth 2 (two) times daily.   methocarbamol 500 MG tablet Commonly known as: ROBAXIN Take 1-2 tablets (500-1,000 mg total) by mouth every 8 (eight) hours as needed for muscle spasms.   metoCLOPramide 10 MG tablet Commonly known as: Reglan Take 1 tablet (10 mg total) by mouth 4 (four) times daily.   montelukast 10 MG tablet Commonly known as: SINGULAIR Take 1 tablet (10 mg total) by mouth at bedtime.   NONFORMULARY OR COMPOUNDED ITEM Swish and spit 5 mLs as needed (ulcer flare up in mouth). Magic mouthwash with lidocaine disp#150 ml   NovoLOG FlexPen 100 UNIT/ML FlexPen Generic drug: insulin aspart Max Daily 50 units   Olopatadine HCl 0.2 % Soln Apply 1 drop to eye daily as needed.   ondansetron 4 MG disintegrating tablet Commonly known as: Zofran ODT Take 1 tablet (4 mg total) by mouth every 8 (eight) hours as needed for nausea or vomiting.   OneTouch Delica Plus [id number] Misc 1 each by Does not apply route 3 (three) times daily. Dx E11.65, z79.4   accu-chek multiclix lancets Check sugar 3-4 times daily  E11.9   OneTouch Verio test strip Generic drug: glucose blood Check blood glucose 3-4 x day. Dx E11.9, E11.649, z79.4,   Accu-Chek Aviva Plus test strip Generic drug: glucose blood Check sugar 3-4 times  daily  E11.9   phenazopyridine 200 MG tablet Commonly known as: PYRIDIUM Take 1 tablet (200 mg total) by mouth 3 (three) times daily as needed for pain.   prochlorperazine 25 MG suppository Commonly known as: COMPAZINE Place 1 suppository (25 mg total) rectally every 12 (twelve) hours as needed for nausea or vomiting.   rosuvastatin 40 MG tablet Commonly known as: CRESTOR 1 tablet   saccharomyces boulardii 250 MG capsule Commonly known as: FLORASTOR See admin instructions.   senna-docusate 8.6-50 MG tablet Commonly known as: Senokot-S Take 1 tablet by mouth at bedtime as needed for mild constipation or moderate constipation.   triamcinolone cream 0.1 % Commonly known as: KENALOG Apply 1 application topically 2 (two) times daily.   WOMENS 50+ MULTI VITAMIN/MIN PO Take by mouth.         OBJECTIVE:   Vital Signs: BP 134/82 (BP Location: Left Arm, Patient Position: Sitting, Cuff Size: Small)   Pulse 71   Ht 5' 2"  (1.575 m)   Wt 163 lb 6.4 oz (74.1 kg)   SpO2 97%   BMI 29.89 kg/m   Wt Readings from Last 3 Encounters:  07/04/22 163 lb 6.4 oz (74.1 kg)  06/30/22 163 lb 9.6 oz (74.2 kg)  05/29/22 160 lb (72.6 kg)     Exam: General: Pt appears well and is in NAD  Lungs: Clear with good BS bilat with no rales, rhonchi, or wheezes  Heart: RRR   Abdomen: soft, nontender  Extremities: No pretibial edema.   Neuro: MS is good with appropriate affect, pt is alert and Ox3      DM Foot Exam 12/27/2021 The skin of the feet is intact without sores or ulcerations. The pedal pulses are 2+ on right and 2+ on left.  The sensation is intact to a screening 5.07, 10 gram monofilament bilaterally        DATA REVIEWED:  Lab Results  Component Value Date   HGBA1C 8.3 (A) 07/04/2022   HGBA1C 7.7 (A) 12/27/2021   HGBA1C 7.6 (A) 08/28/2021    Latest Reference Range & Units 03/16/22 08:00  Sodium 135 - 145 mmol/L 140  Potassium 3.5 - 5.1 mmol/L 3.5  Chloride 98 - 111 mmol/L  105  CO2 22 - 32 mmol/L 27  Glucose 70 - 99 mg/dL 223 (H)  BUN 6 - 20 mg/dL 10  Creatinine 0.44 - 1.00 mg/dL 0.85  Calcium 8.9 - 10.3 mg/dL 9.1  Anion gap 5 - 15  8  Alkaline Phosphatase 38 - 126 U/L 106  Albumin 3.5 - 5.0 g/dL 3.8  AST 15 - 41 U/L 32  ALT 0 - 44 U/L 29  Total Protein 6.5 - 8.1 g/dL 7.9  Total Bilirubin 0.3 - 1.2 mg/dL 0.5  GFR, Estimated >60 mL/min >60  (H): Data is abnormally high  ASSESSMENT / PLAN / RECOMMENDATIONS:   1) Type 2 Diabetes Mellitus, Sub-Optimally controlled, With Neuropathic complications - Most recent A1c of 8.3 % %. Goal A1c < 7.0 %.    -   - Dexcom will be faxed to a DME supplier  - She was given a sample of dexcom G7  MEDICATIONS: -Continue Farxiga  10 mg, 1 tablet with breakfast  -Increase  Basaglar 28 units daily  -Continue  Novolog 20 units with meals  - CF : Novolog ( BG -130/30)  EDUCATION / INSTRUCTIONS: BG monitoring instructions: Patient is instructed to check her blood sugars 3 times a day, before meals . Call [facility] if: BG persistently < 70 I reviewed the Rule of 15 for the treatment of hypoglycemia in detail with the patient. Literature supplied.   2) Diabetic complications:  Eye: Does not have known diabetic retinopathy.  Neuro/ Feet: Does have known diabetic peripheral neuropathy  with toe burning 12/2021 Renal: Patient does not have known baseline CKD. She   is on an ACEI/ARB at present.     F/U in 4 months    Signed electronically by: Mack Guise, MD  [facility]  [facility] [address] [place], [place] [address] Phone: [phone] FAX: [phone]   CC: Leeroy Cha, [address] [place] [address] Phone: [phone]  Fax: [phone]  Return to Endocrinology clinic as below: Future Appointments  Date Time Provider [place]  10/09/2022  1:45 PM Frann Rider, NP [facility] None  06/02/2023   3:30 PM Princess Bruins, MD [facility] None

## 2022-07-08 ENCOUNTER — Other Ambulatory Visit: Payer: Self-pay | Admitting: Internal Medicine

## 2022-07-08 DIAGNOSIS — J302 Other seasonal allergic rhinitis: Secondary | ICD-10-CM

## 2022-07-08 DIAGNOSIS — J4520 Mild intermittent asthma, uncomplicated: Secondary | ICD-10-CM

## 2022-07-08 NOTE — Telephone Encounter (Signed)
May give one 90 day supply. Needs OV.

## 2022-08-10 ENCOUNTER — Ambulatory Visit (HOSPITAL_COMMUNITY)
Admission: EM | Admit: 2022-08-10 | Discharge: 2022-08-10 | Disposition: A | Discharge status: Home / Self Care | Payer: HMO | Attending: Internal Medicine | Admitting: Internal Medicine

## 2022-08-10 ENCOUNTER — Encounter (HOSPITAL_COMMUNITY): Payer: Self-pay | Admitting: Emergency Medicine

## 2022-08-10 ENCOUNTER — Other Ambulatory Visit: Payer: Self-pay

## 2022-08-10 DIAGNOSIS — M542 Cervicalgia: Secondary | ICD-10-CM | POA: Insufficient documentation

## 2022-08-10 DIAGNOSIS — Z1152 Encounter for screening for COVID-19: Secondary | ICD-10-CM | POA: Insufficient documentation

## 2022-08-10 DIAGNOSIS — B349 Viral infection, unspecified: Secondary | ICD-10-CM | POA: Insufficient documentation

## 2022-08-10 LAB — POCT RAPID STREP A, ED / UC: Streptococcus, Group A Screen (Direct): NEGATIVE

## 2022-08-10 LAB — RESP PANEL BY RT-PCR (FLU A&B, COVID) ARPGX2
Influenza A by PCR: NEGATIVE
Influenza B by PCR: NEGATIVE
SARS Coronavirus 2 by RT PCR: NEGATIVE

## 2022-08-10 MED ORDER — KETOROLAC TROMETHAMINE 30 MG/ML IJ SOLN
30.0000 mg | Freq: Once | INTRAMUSCULAR | Status: AC
  Administered 2022-08-10: 30 mg via INTRAMUSCULAR

## 2022-08-10 MED ORDER — BENZONATATE 100 MG PO CAPS: 100.0000 mg | ORAL_CAPSULE | Freq: Three times a day (TID) | ORAL | 0 refills | Status: DC

## 2022-08-10 MED ORDER — ACETAMINOPHEN 325 MG PO TABS
650.0000 mg | ORAL_TABLET | Freq: Once | ORAL | Status: AC
Start: 2022-08-10 — End: 2022-08-10
  Administered 2022-08-10: 650 mg via ORAL

## 2022-08-10 MED ORDER — METHOCARBAMOL 500 MG PO TABS: 500.0000 mg | ORAL_TABLET | Freq: Two times a day (BID) | ORAL | 0 refills | Status: DC

## 2022-08-10 MED ORDER — ACETAMINOPHEN 325 MG PO TABS
ORAL_TABLET | ORAL | Status: AC
  Filled 2022-08-10: qty 1

## 2022-08-10 MED ORDER — KETOROLAC TROMETHAMINE 30 MG/ML IJ SOLN
INTRAMUSCULAR | Status: AC
  Filled 2022-08-10: qty 1

## 2022-08-10 NOTE — ED Provider Notes (Signed)
[place]    CSN: [id number] Arrival date & time: 08/10/22  1038      History   Chief Complaint Chief Complaint  Patient presents with   Generalized Body Aches    HPI Beverly Ramirez is a 61 y.o. female. Patient presents c/o generalized body aches, headache, sore throat, and fever for the past 6 days.  Patient reports painful swallowing.  Patient denies any recent exposure to any viral illnesses.  Patient reports symptoms have progressively worsened.  Patient has not taken any medications for symptoms.  Patient reports a history of diabetes mellitus and she checks her blood sugars daily at home.   Patient reports an ongoing muscle pain on the left side of her neck that has been occurring "for a while now".  Patient denies any fall or trauma.  Patient denies any numbness or tingling in any of her extremities.  Patient denies any previous orthopedic surgeries in the location where pain is occurring.  Patient reports a history of fibromyalgia and she is unsure if the neck pain is related.  Patient states that pain in her neck hurts when she is turning her neck to the right side. History of degenerative disc disease in the cervical spine per EPIC.   HPI  Past Medical History:  Diagnosis Date   Anemia    Anxiety    Carpal tunnel syndrome    bilateral   Chronic heel pain    Diabetes mellitus without complication (HCC)    DJD (degenerative joint disease) of cervical spine    MRI 2017   Fibromyalgia    Hyperlipidemia    Hypertension    Plantar fasciitis    bilateral   Sickle cell trait (HCC)    Sleep apnea    Tendonitis    left hand/wrist   UC (ulcerative colitis) ([place])     Patient Active Problem List   Diagnosis Date Noted   Allergic conjunctivitis of both eyes 01/01/2022   Seasonal and perennial allergic rhinitis 01/01/2022   Latex allergy 01/01/2022   Allergy to sulfa drugs 01/01/2022   Mild intermittent asthma 01/01/2022   Type 2 diabetes mellitus with  hyperglycemia, with long-term current use of insulin ([place]) 05/31/2020   Fibromyalgia 05/31/2020   Mouth ulcers 05/25/2020   Elevated glucose 12/23/2019   Diabetic ketoacidosis without coma associated with type 2 diabetes mellitus ([place]) 12/23/2019   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   De Quervain's tenosynovitis, left 11/16/2018   Bilateral carpal tunnel syndrome 04/13/2018   Leukocytosis 10/04/2017   Hyperlipidemia 08/04/2017   Ulcerative colitis with complication ([place]) 09/38/1829   Plantar fasciitis 05/28/2017    Past Surgical History:  Procedure Laterality Date   ABDOMINAL HYSTERECTOMY     APPENDECTOMY     CESAREAN SECTION     x4   COLONOSCOPY     Multiple in [place]   ESOPHAGOGASTRODUODENOSCOPY     ROTATOR CUFF REPAIR Left    03/26/2022    OB History     Gravida  4   Para  4   Term  4   Preterm      AB      Living  4      SAB      IAB      Ectopic      Multiple      Live Births               Home Medications    Prior to Admission medications   Medication  Sig Start Date End Date Taking? Authorizing Provider  benzonatate (TESSALON) 100 MG capsule Take 1 capsule (100 mg total) by mouth every 8 (eight) hours. 08/10/22  Yes Flossie Dibble, NP  methocarbamol (ROBAXIN) 500 MG tablet Take 1 tablet (500 mg total) by mouth 2 (two) times daily. 08/10/22  Yes Flossie Dibble, NP  albuterol (VENTOLIN HFA) 108 (90 Base) MCG/ACT inhaler Inhale 2 puffs into the lungs every 6 (six) hours as needed for wheezing or shortness of breath. Patient not taking: Reported on 08/10/2022 03/13/20   Jacelyn Pi, Lilia Argue, MD  amLODipine (NORVASC) 5 MG tablet TAKE 1 TABLET BY MOUTH EVERY DAY 06/08/20   Jacelyn Pi, Lilia Argue, MD  chlorzoxazone (PARAFON) 500 MG tablet Take 500 mg by mouth 3 (three) times daily as needed. 08/12/21   [provider]  Continuous Blood Gluc Sensor (DEXCOM G7 SENSOR) MISC 1 Device by Does not apply route as directed. 07/04/22    Shamleffer, Melanie Crazier, MD  dapagliflozin propanediol (FARXIGA) 10 MG TABS tablet Take 1 tablet (10 mg total) by mouth daily. Patient not taking: Reported on 07/04/2022 10/26/20   Shamleffer, Melanie Crazier, MD  diclofenac Sodium (VOLTAREN) 1 % GEL Apply 4 g topically 4 (four) times daily as needed (pain). 03/16/22   Petrucelli, Samantha R, PA-C  dicyclomine (BENTYL) 20 MG tablet Take 1 tablet (20 mg total) by mouth 3 (three) times daily as needed for spasms (abdominal cramping). 10/18/20   Wendie Agreste, MD  DULoxetine (CYMBALTA) 60 MG capsule TAKE 1 CAPSULE BY MOUTH EVERY DAY Patient not taking: Reported on 08/10/2022 03/20/20   Jacelyn Pi, Lilia Argue, MD  escitalopram (LEXAPRO) 20 MG tablet Take 20 mg by mouth daily. 05/07/22   [provider]  esomeprazole (NEXIUM) 40 MG capsule TAKE 1 CAPSULE BY MOUTH EVERY DAY BEFORE BREAKFAST Patient taking differently: Take by mouth as needed. 07/20/20   Gatha Mayer, MD  ezetimibe (ZETIA) 10 MG tablet Take 10 mg by mouth daily. Patient not taking: Reported on 08/10/2022 05/21/22   [provider]  fluticasone (FLONASE) 50 MCG/ACT nasal spray SPRAY 2 SPRAYS INTO EACH NOSTRIL EVERY DAY Patient taking differently: Patient takes as needed 03/08/20   Jacelyn Pi, Lilia Argue, MD  gabapentin (NEURONTIN) 300 MG capsule Take 300 mg by mouth at bedtime. Patient not taking: Reported on 08/10/2022 05/30/21   [provider]  glucose blood (ACCU-CHEK AVIVA PLUS) test strip Check sugar 3-4 times daily  E11.9 12/27/21   Shamleffer, Melanie Crazier, MD  glucose blood (ONETOUCH VERIO) test strip Check blood glucose 3-4 x day. Dx E11.9, E11.649, z79.4, 11/06/20   Maximiano Coss, NP  insulin aspart (NOVOLOG FLEXPEN) 100 UNIT/ML FlexPen Max Daily 50 units 05/31/21   Shamleffer, Melanie Crazier, MD  Insulin Glargine (BASAGLAR KWIKPEN) 100 UNIT/ML Inject 30 Units into the skin at bedtime. Patient taking differently: Inject 17 Units into the skin at  bedtime. 05/31/21   Shamleffer, Melanie Crazier, MD  Insulin Pen Needle 32G X 4 MM MISC 1 Device by Does not apply route in the morning, at noon, in the evening, and at bedtime. 05/31/21   Shamleffer, Melanie Crazier, MD  Lancets (ACCU-CHEK MULTICLIX) lancets Check sugar 3-4 times daily  E11.9 02/13/22   Shamleffer, Melanie Crazier, MD  Lancets [facility] DELICA PLUS [id number]) MISC 1 each by Does not apply route 3 (three) times daily. Dx E11.65, z79.4 01/24/19   Jacelyn Pi, Lilia Argue, MD  lidocaine (XYLOCAINE) 2 % solution Use as directed 15 mLs  in the mouth or throat as needed for mouth pain. 03/09/20   Gatha Mayer, MD  lisinopril (ZESTRIL) 10 MG tablet Take 10 mg by mouth daily. 08/07/21   [provider]  loratadine (CLARITIN) 10 MG tablet Take 10 mg by mouth daily. 05/10/22   [provider]  meclizine (ANTIVERT) 25 MG tablet TAKE 1 TABLET BY MOUTH 3 TIMES A DAY AS NEEDED FOR DIZZINESS    [provider]  mesalamine (LIALDA) 1.2 g EC tablet Take 2 tablets (2.4 g total) by mouth 2 (two) times daily. 04/28/20 05/29/22  Gatha Mayer, MD  metoCLOPramide (REGLAN) 10 MG tablet Take 1 tablet (10 mg total) by mouth 4 (four) times daily. 08/05/21   Willia Craze, NP  montelukast (SINGULAIR) 10 MG tablet TAKE 1 TABLET BY MOUTH EVERYDAY AT BEDTIME 07/08/22   Clemon Chambers, MD  Multiple Vitamins-Minerals (WOMENS 50+ MULTI VITAMIN/MIN PO) Take by mouth.    [provider]  NONFORMULARY OR COMPOUNDED ITEM Swish and spit 5 mLs as needed (ulcer flare up in mouth). Magic mouthwash with lidocaine disp#150 ml 05/04/20   Gatha Mayer, MD  Olopatadine HCl 0.2 % SOLN Apply 1 drop to eye daily as needed. 01/01/22   Clemon Chambers, MD  ondansetron (ZOFRAN ODT) 4 MG disintegrating tablet Take 1 tablet (4 mg total) by mouth every 8 (eight) hours as needed for nausea or vomiting. 05/08/20   Long, Wonda Olds, MD  phenazopyridine (PYRIDIUM) 200 MG tablet Take 1 tablet (200 mg total) by mouth  3 (three) times daily as needed for pain. Patient not taking: Reported on 07/04/2022 04/02/21   Volney American, PA-C  prochlorperazine (COMPAZINE) 25 MG suppository Place 1 suppository (25 mg total) rectally every 12 (twelve) hours as needed for nausea or vomiting. 07/16/21   Willia Craze, NP  rosuvastatin (CRESTOR) 40 MG tablet 1 tablet    [provider]  saccharomyces boulardii (FLORASTOR) 250 MG capsule See admin instructions. 06/24/21   [provider]  senna-docusate (SENOKOT-S) 8.6-50 MG tablet Take 1 tablet by mouth at bedtime as needed for mild constipation or moderate constipation. 02/11/21   Long, Wonda Olds, MD  triamcinolone cream (KENALOG) 0.1 % Apply 1 application topically 2 (two) times daily. 04/19/20   Daleen Squibb, MD    Family History Family History  Problem Relation Age of Onset   Stroke Mother    Diabetes Mother    Hypertension Mother    Lung cancer Mother    Sickle cell trait Mother    Sickle cell trait Father    Sickle cell anemia Sister 4   Sickle cell anemia Sister    Other Sister        accident and blood clot formed   Diabetes Maternal Aunt    Lung cancer Maternal Aunt    Lung cancer Maternal Aunt    Lung cancer Maternal Aunt    Lung cancer Maternal Grandmother    Colon cancer Cousin 30   Pancreatic cancer Neg Hx    Sleep apnea Neg Hx    Headache Neg Hx     Social History Social History   Tobacco Use   Smoking status: Former    Types: Cigarettes    Quit date: 10/13/2006    Years since quitting: 15.8   Smokeless tobacco: Never  Vaping Use   Vaping Use: Never used  Substance Use Topics   Alcohol use: Yes    Comment: occasionally   Drug  use: No     Allergies   Sulfa antibiotics, Sulfamethoxazole-trimethoprim, Codeine, and Latex   Review of Systems Review of Systems  Constitutional:  Positive for activity change, appetite change, chills, fatigue and fever.  HENT:  Positive for congestion, ear pain  (Burning bilaterally), rhinorrhea and sore throat. Negative for ear discharge, postnasal drip, sinus pressure, sinus pain, sneezing, tinnitus, trouble swallowing and voice change.   Eyes: Negative.   Respiratory:  Positive for cough. Negative for chest tightness, shortness of breath, wheezing and stridor.   Cardiovascular:  Negative for chest pain and palpitations.  Gastrointestinal:  Negative for abdominal pain, diarrhea, nausea and vomiting.  Musculoskeletal:  Negative for back pain.  Neurological:  Negative for weakness and numbness.     Physical Exam Triage Vital Signs ED Triage Vitals  Enc Vitals Group     BP 08/10/22 1305 130/81     Pulse Rate 08/10/22 1305 (!) 119     Resp 08/10/22 1305 18     Temp 08/10/22 1305 (!) 102 F (38.9 C)     Temp Source 08/10/22 1305 Oral     SpO2 08/10/22 1305 94 %     Weight --      Height --      Head Circumference --      Peak Flow --      Pain Score 08/10/22 1300 9     Pain Loc --      Pain Edu? --      Excl. in [place]? --    No data found.  Updated Vital Signs BP 130/81 (BP Location: Left Arm)   Pulse (!) 119   Temp 100.3 F (37.9 C) (Oral)   Resp 18   SpO2 94%      Physical Exam Vitals and nursing note reviewed.  HENT:     Right Ear: Hearing, tympanic membrane, ear canal and external ear normal.     Left Ear: Hearing, tympanic membrane, ear canal and external ear normal.     Nose: Rhinorrhea present. Rhinorrhea is clear.     Right Turbinates: Not enlarged, swollen or pale.     Left Turbinates: Not enlarged, swollen or pale.     Right Sinus: No maxillary sinus tenderness or frontal sinus tenderness.     Left Sinus: No maxillary sinus tenderness or frontal sinus tenderness.     Mouth/Throat:     Mouth: Mucous membranes are moist.     Dentition: Normal dentition.     Pharynx: Uvula midline. Posterior oropharyngeal erythema present. No pharyngeal swelling, oropharyngeal exudate or uvula swelling.     Tonsils: 0 on the right. 0  on the left.  Neck:     Comments: Patient reports pain with movement of neck to LFT side.  Cardiovascular:     Rate and Rhythm: Normal rate and regular rhythm.     Heart sounds: Normal heart sounds, S1 normal and S2 normal.  Pulmonary:     Effort: Pulmonary effort is normal.     Breath sounds: Normal breath sounds. No decreased breath sounds, wheezing, rhonchi or rales.  Musculoskeletal:     Cervical back: No edema, erythema, signs of trauma, rigidity, torticollis or crepitus. Pain with movement present. No spinous process tenderness or muscular tenderness. Normal range of motion.  Lymphadenopathy:     Cervical: No cervical adenopathy.  Neurological:     General: No focal deficit present.     Mental Status: She is alert.      UC Treatments / Results  Labs (all labs ordered are listed, but only abnormal results are displayed) Labs Reviewed  RESP PANEL BY RT-PCR (FLU A&B, COVID) [id number]  CULTURE, GROUP A STREP [facility])  POCT RAPID STREP A, ED / UC    EKG   Radiology No results found.  Procedures Procedures (including critical care time)  Medications Ordered in UC Medications  acetaminophen (TYLENOL) tablet 650 mg (650 mg Oral Given 08/10/22 1316)  ketorolac (TORADOL) 30 MG/ML injection 30 mg (30 mg Intramuscular Given 08/10/22 1418)    Initial Impression / Assessment and Plan / UC Course  I have reviewed the triage vital signs and the nursing notes.  Pertinent labs & imaging results that were available during my care of the patient were reviewed by me and considered in my medical decision making (see chart for details).     Patient was evaluated for viral illness. POC strep was negative. Strep Culture is pending to rule out bacterial etiology. COVID and Flu test pending. Due to time frame of symptoms, patient is not a candidate for antiviral therapy for Flu or COVID, patient was made aware of this. Tylenol given in office lowered patients temperature to 100.3 F, she was  made aware of continuing to take tylenol at home. Toradol given in office for generalized pain and MSK neck pain. Muscle relaxant and tessalon for cough was sent to the pharmacy. Patient was made aware of safety precautions with muscle relaxant. Patient was made aware of symptom management of viral illness. Patient made aware of timeline for symptom resolution and when follow-up would be necessary.  Patient made aware of results reporting protocol and MyChart.  Patient verbalized understanding of instructions.    Charting was provided using a a verbal dictation system, charting was proofread for errors, errors may occur which could change the meaning of the information charted.   Final Clinical Impressions(s) / UC Diagnoses   Final diagnoses:  Viral syndrome  Musculoskeletal neck pain     Discharge Instructions      We will call you if any of your test results warrant a change in your plan of care.   Robaxin is a muscle relaxant, its been sent to the pharmacy. You may take this 2 times daily.  Please do not operate any heavy machinery or drive a car after taking this medication.   Tessalon has been sent to the pharmacy for cough, you can use this medication every 8 hours as needed.   Viral illnesses usually takes 7 to 10 days to resolve.  Using over-the-counter medications can help treat the symptoms.  You may rotate Tylenol and ibuprofen every 4-6 hours.  For example, take Tylenol now and then 4 to 6 hours later take ibuprofen.  You can use Tylenol for fever and moderate pain, you can take this medication every 4-6 hours, please do not take more than 3000 mg in a 24-hour day.  You can take ibuprofen every 6 hours, do not take more than 2400 mg in a 24-hour day.  I advised that you do not take ibuprofen on an empty stomach, ibuprofen can cause GI problems such as GI bleeding.  Chloraseptic throat spray and lozenges can be used for sore throat.  You may use warm liquids and honey for symptom  management.  Maintaining hydration status is very important, please drink at least 8 cups of water daily.  Please try to intake nutrient dense meals.   Please follow up with this office or with your PCP if symptoms are  not improving.      ED Prescriptions     Medication Sig Dispense Auth. Provider   methocarbamol (ROBAXIN) 500 MG tablet Take 1 tablet (500 mg total) by mouth 2 (two) times daily. 24 tablet Flossie Dibble, NP   benzonatate (TESSALON) 100 MG capsule Take 1 capsule (100 mg total) by mouth every 8 (eight) hours. 24 capsule Flossie Dibble, NP      PDMP not reviewed this encounter.   Flossie Dibble, NP 08/10/22 1534

## 2022-08-10 NOTE — Discharge Instructions (Addendum)
We will call you if any of your test results warrant a change in your plan of care.   Robaxin is a muscle relaxant, its been sent to the pharmacy. You may take this 2 times daily.  Please do not operate any heavy machinery or drive a car after taking this medication.   Tessalon has been sent to the pharmacy for cough, you can use this medication every 8 hours as needed.   Viral illnesses usually takes 7 to 10 days to resolve.  Using over-the-counter medications can help treat the symptoms.  You may rotate Tylenol and ibuprofen every 4-6 hours.  For example, take Tylenol now and then 4 to 6 hours later take ibuprofen.  You can use Tylenol for fever and moderate pain, you can take this medication every 4-6 hours, please do not take more than 3000 mg in a 24-hour day.  You can take ibuprofen every 6 hours, do not take more than 2400 mg in a 24-hour day.  I advised that you do not take ibuprofen on an empty stomach, ibuprofen can cause GI problems such as GI bleeding.  Chloraseptic throat spray and lozenges can be used for sore throat.  You may use warm liquids and honey for symptom management.  Maintaining hydration status is very important, please drink at least 8 cups of water daily.  Please try to intake nutrient dense meals.   Please follow up with this office or with your PCP if symptoms are not improving.

## 2022-08-10 NOTE — ED Triage Notes (Signed)
Onset 08/04/2022 of body aches, then had a headache, general aches, fever (102), and sore throat.  Patient has not had any medicines for these symptoms

## 2022-08-12 LAB — CULTURE, GROUP A STREP (THRC)

## 2022-08-28 ENCOUNTER — Telehealth: Payer: Self-pay | Admitting: Neurology

## 2022-08-28 DIAGNOSIS — R519 Headache, unspecified: Secondary | ICD-10-CM

## 2022-08-28 DIAGNOSIS — G4733 Obstructive sleep apnea (adult) (pediatric): Secondary | ICD-10-CM

## 2022-08-28 NOTE — Telephone Encounter (Signed)
This pt from the notes has noted started cpap.  She will need a new sleep study.  Last ss was 2021.

## 2022-08-28 NOTE — Telephone Encounter (Signed)
Pt has called to report she is still waiting on her mask, she has not heard from anyone.  Pt was told to call if 2 weeks passed , and if were still waiting.  Please call pt about CPAP mask

## 2022-08-29 NOTE — Telephone Encounter (Signed)
HST ordered.

## 2022-08-29 NOTE — Addendum Note (Signed)
Addended by: Star Age on: 08/29/2022 11:58 AM   Modules accepted: Orders

## 2022-09-02 ENCOUNTER — Encounter: Payer: Self-pay | Admitting: Neurology

## 2022-09-03 NOTE — Telephone Encounter (Signed)
[facility] msg sent to pt

## 2022-09-11 ENCOUNTER — Other Ambulatory Visit: Payer: Self-pay | Admitting: Internal Medicine

## 2022-10-08 ENCOUNTER — Telehealth: Payer: Self-pay

## 2022-10-08 NOTE — Telephone Encounter (Signed)
Can this patient be reached out to and r/s? She hasnt completed sleep study yet, she can r/s once sleep study is completed. Thank you.

## 2022-10-09 ENCOUNTER — Ambulatory Visit: Payer: HMO | Admitting: Adult Health

## 2022-10-21 ENCOUNTER — Ambulatory Visit (INDEPENDENT_AMBULATORY_CARE_PROVIDER_SITE_OTHER): Payer: PPO | Admitting: Neurology

## 2022-10-21 DIAGNOSIS — M25512 Pain in left shoulder: Secondary | ICD-10-CM | POA: Diagnosis not present

## 2022-10-21 DIAGNOSIS — R519 Headache, unspecified: Secondary | ICD-10-CM

## 2022-10-21 DIAGNOSIS — G4733 Obstructive sleep apnea (adult) (pediatric): Secondary | ICD-10-CM | POA: Diagnosis not present

## 2022-10-23 NOTE — Progress Notes (Signed)
See procedure note.

## 2022-10-24 NOTE — Procedures (Signed)
[facility]  HOME SLEEP TEST (Watch PAT) REPORT  STUDY DATE: 10/21/2022  DOB: 18-Dec-1960  MRN: [id number]  ORDERING CLINICIAN: Star Age, MD, PhD   REFERRING CLINICIAN: Leeroy Cha, MD   CLINICAL INFORMATION/HISTORY: 62 year old right-handed woman with an underlying medical history of sickle cell anemia, ulcerative colitis, hyperlipidemia, degenerative cervical disc disease, recurrent headaches, diabetes, carpal tunnel syndrome, anemia, and borderline obesity, who presents for reevaluation of her obstructive sleep apnea.  She has not been on PAP therapy.  Epworth sleepiness score: 8/24.  BMI: 30 kg/m  FINDINGS:   Sleep Summary:   Total Recording Time (hours, min): 10 hours, 27 min  Total Sleep Time (hours, min):  8 hours, 58 min  Percent REM (%):    21.9%   Respiratory Indices:   Calculated pAHI (per hour):  17.6/hour         REM pAHI:    22.2/hour       NREM pAHI: 16.2/hour  Central pAHI: 1.3/hour  Oxygen Saturation Statistics:    Oxygen Saturation (%) Mean: 95%   Minimum oxygen saturation (%):                 83%   O2 Saturation Range (%): 83- 100%    O2 Saturation (minutes) <=88%: 0.8 min  Pulse Rate Statistics:   Pulse Mean (bpm):    67/min    Pulse Range (47 - 92/min)   IMPRESSION: OSA (obstructive sleep apnea)  RECOMMENDATION:  This home sleep test demonstrates moderate obstructive sleep apnea with a total AHI of 17.6/hour and O2 nadir of 83%.  Intermittent mild to moderate snoring was detected, at times in the louder range. Treatment with a positive airway pressure (PAP) device is recommended. The patient will be advised to proceed with an autoPAP titration/trial at home for now. A full night titration study may be considered to optimize treatment settings, monitor proper oxygen saturations and aid with improvement of tolerance and adherence, if needed down the road. Alternative treatment options may include a dental  device through dentistry or orthodontics in selected patients or Inspire (hypoglossal nerve stimulator) in carefully selected patients (meeting inclusion criteria).  Concomitant weight loss is recommended (where clinically appropriate). Please note that untreated obstructive sleep apnea may carry additional perioperative morbidity. Patients with significant obstructive sleep apnea should receive perioperative PAP therapy and the surgeons and particularly the anesthesiologist should be informed of the diagnosis and the severity of the sleep disordered breathing. The patient should be cautioned not to drive, work at heights, or operate dangerous or heavy equipment when tired or sleepy. Review and reiteration of good sleep hygiene measures should be pursued with any patient. Other causes of the patient's symptoms, including circadian rhythm disturbances, an underlying mood disorder, medication effect and/or an underlying medical problem cannot be ruled out based on this test. Clinical correlation is recommended.  The patient and her referring provider will be notified of the test results. The patient will be seen in follow up in sleep clinic at [facility].  I certify that I have reviewed the raw data recording prior to the issuance of this report in accordance with the standards of the American Academy of Sleep Medicine (AASM).  INTERPRETING PHYSICIAN:   Star Age, MD, PhD Medical Director, [facility]) [place], ABPN (Neurology and Sleep)   [facility] [address], [place], [place] [address] [phone]

## 2022-10-24 NOTE — Addendum Note (Signed)
Addended by: Star Age on: 10/24/2022 11:08 AM   Modules accepted: Orders

## 2022-10-27 ENCOUNTER — Telehealth: Payer: Self-pay | Admitting: *Deleted

## 2022-10-27 NOTE — Telephone Encounter (Signed)
Spoke with patient and discussed her sleep study results. Patient verbalized understanding and is amenable to starting autopap. We discussed the insurance compliance requirements which includes using the machine at least 4 hours at night and also being seen in our office for an initial follow-up appointment between 30 and 90 days after setup. Pt scheduled initial f/u for wed 5/22 at 2:15 pm arrival 1:45. Discussed DME with patient. Will refer to [employer]. She will watch for a call from them within 1 week. I sent [employer]'s # to her via mychart.  Result forwarded to referring provider. Orders sent to [employer].

## 2022-10-27 NOTE — Telephone Encounter (Signed)
[employer] confirmed receipt of order.

## 2022-10-27 NOTE — Telephone Encounter (Signed)
-----   Message from Star Age, MD sent at 10/24/2022 11:07 AM EST ----- Patient was last seen by me on 06/30/2022, patient had a HST on 10/21/2022.    Please call and notify the patient that the recent home sleep test showed obstructive sleep apnea in the moderate range. I recommend treatment in the form of autoPAP, which means, that we don't have to bring her in for a sleep study with CPAP, but will let her start using a so called autoPAP machine at home, which is a CPAP-like machine with self-adjusting pressures. We will send the order to a local DME company (of her choice, or as per insurance requirement). The DME representative will fit her with a mask, educate her on how to use the machine, how to put the mask on, etc. I have placed an order in the chart. Please send the order, talk to patient, send report to referring MD. We will need a FU in sleep clinic for 10 weeks post-PAP set up, please arrange that with me or one of our NPs. Also reinforce the need for compliance with treatment. Thanks,   Star Age, MD, PhD [facility])

## 2022-10-29 ENCOUNTER — Telehealth: Payer: Self-pay | Admitting: Internal Medicine

## 2022-10-29 DIAGNOSIS — E119 Type 2 diabetes mellitus without complications: Secondary | ICD-10-CM | POA: Diagnosis not present

## 2022-10-29 DIAGNOSIS — R21 Rash and other nonspecific skin eruption: Secondary | ICD-10-CM | POA: Diagnosis not present

## 2022-10-29 DIAGNOSIS — R300 Dysuria: Secondary | ICD-10-CM | POA: Diagnosis not present

## 2022-10-29 DIAGNOSIS — K2100 Gastro-esophageal reflux disease with esophagitis, without bleeding: Secondary | ICD-10-CM | POA: Diagnosis not present

## 2022-10-29 DIAGNOSIS — E1169 Type 2 diabetes mellitus with other specified complication: Secondary | ICD-10-CM | POA: Diagnosis not present

## 2022-10-29 NOTE — Telephone Encounter (Signed)
New message    The patient as the front desk asking can Free style libre 2 to be called in   [facility] in [place]   The patient insurance will not cover Dexcom .  glucose blood (ACCU-CHEK AVIVA PLUS) test strip   Insulin Pen Needle 32G X 4 MM MISC   Lancets (ACCU-CHEK MULTICLIX) lancets patient asking for refill called in

## 2022-10-30 MED ORDER — ACCU-CHEK MULTICLIX LANCETS MISC: 12 refills | Status: DC

## 2022-10-30 MED ORDER — ACCU-CHEK AVIVA PLUS VI STRP: ORAL_STRIP | 12 refills | Status: AC

## 2022-10-30 MED ORDER — INSULIN PEN NEEDLE 32G X 4 MM MISC: 1.0000 | Freq: Four times a day (QID) | 3 refills | Status: DC

## 2022-10-30 MED ORDER — FREESTYLE LIBRE 2 SENSOR MISC: 3 refills | Status: DC

## 2022-10-30 NOTE — Telephone Encounter (Signed)
Scripts have been sent

## 2022-11-01 ENCOUNTER — Other Ambulatory Visit: Payer: Self-pay | Admitting: Internal Medicine

## 2022-11-02 ENCOUNTER — Telehealth: Payer: Self-pay

## 2022-11-02 NOTE — Telephone Encounter (Signed)
PA needed for Accu-Chek Lancets

## 2022-12-04 DIAGNOSIS — Z23 Encounter for immunization: Secondary | ICD-10-CM | POA: Diagnosis not present

## 2022-12-04 DIAGNOSIS — E785 Hyperlipidemia, unspecified: Secondary | ICD-10-CM | POA: Diagnosis not present

## 2022-12-04 DIAGNOSIS — Z0000 Encounter for general adult medical examination without abnormal findings: Secondary | ICD-10-CM | POA: Diagnosis not present

## 2022-12-04 DIAGNOSIS — E119 Type 2 diabetes mellitus without complications: Secondary | ICD-10-CM | POA: Diagnosis not present

## 2022-12-04 DIAGNOSIS — E559 Vitamin D deficiency, unspecified: Secondary | ICD-10-CM | POA: Diagnosis not present

## 2022-12-04 DIAGNOSIS — G8929 Other chronic pain: Secondary | ICD-10-CM | POA: Diagnosis not present

## 2022-12-04 DIAGNOSIS — D649 Anemia, unspecified: Secondary | ICD-10-CM | POA: Diagnosis not present

## 2022-12-04 DIAGNOSIS — K2100 Gastro-esophageal reflux disease with esophagitis, without bleeding: Secondary | ICD-10-CM | POA: Diagnosis not present

## 2022-12-04 DIAGNOSIS — I10 Essential (primary) hypertension: Secondary | ICD-10-CM | POA: Diagnosis not present

## 2022-12-04 DIAGNOSIS — E1169 Type 2 diabetes mellitus with other specified complication: Secondary | ICD-10-CM | POA: Diagnosis not present

## 2022-12-04 DIAGNOSIS — M797 Fibromyalgia: Secondary | ICD-10-CM | POA: Diagnosis not present

## 2022-12-04 DIAGNOSIS — Z794 Long term (current) use of insulin: Secondary | ICD-10-CM | POA: Diagnosis not present

## 2022-12-04 DIAGNOSIS — J301 Allergic rhinitis due to pollen: Secondary | ICD-10-CM | POA: Diagnosis not present

## 2022-12-04 DIAGNOSIS — G44209 Tension-type headache, unspecified, not intractable: Secondary | ICD-10-CM | POA: Diagnosis not present

## 2022-12-10 DIAGNOSIS — G4733 Obstructive sleep apnea (adult) (pediatric): Secondary | ICD-10-CM | POA: Diagnosis not present

## 2022-12-23 DIAGNOSIS — F4312 Post-traumatic stress disorder, chronic: Secondary | ICD-10-CM | POA: Diagnosis not present

## 2022-12-24 DIAGNOSIS — D329 Benign neoplasm of meninges, unspecified: Secondary | ICD-10-CM | POA: Diagnosis not present

## 2023-01-05 DIAGNOSIS — E119 Type 2 diabetes mellitus without complications: Secondary | ICD-10-CM | POA: Diagnosis not present

## 2023-01-05 DIAGNOSIS — H1013 Acute atopic conjunctivitis, bilateral: Secondary | ICD-10-CM | POA: Diagnosis not present

## 2023-01-05 DIAGNOSIS — J302 Other seasonal allergic rhinitis: Secondary | ICD-10-CM | POA: Diagnosis not present

## 2023-01-05 DIAGNOSIS — Z794 Long term (current) use of insulin: Secondary | ICD-10-CM | POA: Diagnosis not present

## 2023-01-05 DIAGNOSIS — J019 Acute sinusitis, unspecified: Secondary | ICD-10-CM | POA: Diagnosis not present

## 2023-01-08 DIAGNOSIS — R519 Headache, unspecified: Secondary | ICD-10-CM | POA: Diagnosis not present

## 2023-01-08 DIAGNOSIS — D329 Benign neoplasm of meninges, unspecified: Secondary | ICD-10-CM | POA: Diagnosis not present

## 2023-01-08 DIAGNOSIS — H9190 Unspecified hearing loss, unspecified ear: Secondary | ICD-10-CM | POA: Diagnosis not present

## 2023-01-09 ENCOUNTER — Encounter: Payer: Self-pay | Admitting: Internal Medicine

## 2023-01-09 ENCOUNTER — Ambulatory Visit (INDEPENDENT_AMBULATORY_CARE_PROVIDER_SITE_OTHER): Payer: PPO | Admitting: Internal Medicine

## 2023-01-09 VITALS — BP 122/74 | HR 88 | Ht 62.0 in | Wt 161.0 lb

## 2023-01-09 DIAGNOSIS — Z794 Long term (current) use of insulin: Secondary | ICD-10-CM | POA: Diagnosis not present

## 2023-01-09 DIAGNOSIS — Z7985 Long-term (current) use of injectable non-insulin antidiabetic drugs: Secondary | ICD-10-CM | POA: Diagnosis not present

## 2023-01-09 DIAGNOSIS — Z7984 Long term (current) use of oral hypoglycemic drugs: Secondary | ICD-10-CM | POA: Diagnosis not present

## 2023-01-09 DIAGNOSIS — E1165 Type 2 diabetes mellitus with hyperglycemia: Secondary | ICD-10-CM

## 2023-01-09 LAB — POCT GLYCOSYLATED HEMOGLOBIN (HGB A1C): Hemoglobin A1C: 6.9 % — AB (ref 4.0–5.6)

## 2023-01-09 MED ORDER — FREESTYLE LIBRE 3 SENSOR MISC: 1.0000 | 3 refills | Status: DC

## 2023-01-09 MED ORDER — SEMAGLUTIDE(0.25 OR 0.5MG/DOS) 2 MG/3ML ~~LOC~~ SOPN: 0.5000 mg | PEN_INJECTOR | SUBCUTANEOUS | 3 refills | Status: DC

## 2023-01-09 MED ORDER — FREESTYLE LIBRE 3 READER DEVI: 1.0000 | 0 refills | Status: AC

## 2023-01-09 NOTE — Patient Instructions (Signed)
-   Start Ozempic 0.25 mg weekly for 6 weeks than increase 0.5 mg weekly  - Continue Farxiga 10 mg daily  - Continue  Basaglar 20  units daily  - Decrease  Novolog 10  units with meals  -Novolog correctional insulin: ADD extra units on insulin to your meal-time Novolog dose if your blood sugars are higher than 160. USE BEFORE YOU EAT   Blood sugar before meal Number of units to inject  Less than 160 0 unit  161 - 190 1 units  191 - 220 2 units  221 -  250 3 units  251 -  280 4 units  281 -  310 5 units    HOW TO TREAT LOW BLOOD SUGARS (Blood sugar LESS THAN 70 MG/DL) Please follow the RULE OF 15 for the treatment of hypoglycemia treatment (when your (blood sugars are less than 70 mg/dL)   STEP 1: Take 15 grams of carbohydrates when your blood sugar is low, which includes:  4 GLUCOSE TABS  OR 4 OZ OF JUICE OR REGULAR SODA OR ONE TUBE OF GLUCOSE GEL    STEP 2: RECHECK blood sugar in 15 MINUTES STEP 3: If your blood sugar is still low at the 15 minute recheck --> then, go back to STEP 1 and treat AGAIN with another 15 grams of carbohydrates

## 2023-01-09 NOTE — Progress Notes (Unsigned)
Name: Beverly Ramirez  Age/ Sex: 62 y.o., female   MRN/ DOB: [id number], [birth year]     PCP: Lorenda Ishihara, MD   Reason for Endocrinology Evaluation: Type 2 Diabetes Mellitus  Initial Endocrine Consultative Visit: 07/23/2020    PATIENT IDENTIFIER: Beverly Ramirez is a 62 y.o. female with a past medical history of T2DM, OSA, fibromyalgia and UC. The patient has followed with Endocrinology clinic since 07/23/2020 for consultative assistance with management of her diabetes.  DIABETIC HISTORY:  Beverly Ramirez was diagnosed with DM in 2019.Has been on insulin intermittently while on prednisone,  Metformin- too sick. Marland Kitchen Her hemoglobin A1c has ranged from 7.1%in 2021 , peaking at 9.5% in 2020   On her initial visit to our clinic she had an A1c of 7.2 % she was on basal insulin and Novolog, We added farxiga    SUBJECTIVE:   During the last visit (07/05/2023): A1c 8.3 %    Today (01/09/2023): Beverly Ramirez is here for a follow up diabetes management.  She checks her blood sugars multiple  times a day through freestyle libre . She has had  not had episode of hypoglycemia in the morning    She has left arm sx in 03/2022, continues to follow-up with orthopedics  She is not sure about her basaglar dose 20-25 units   Denies nausea, vomiting or diarrhea     HOME DIABETES REGIMEN:  Farxiga 10 mg, 1 tablet with breakfast  Basaglar 20 units daily  Novolog 15 units TIDQAC CF : Novolog ( BG -130/30)     Statin: yes ACE-I/ARB: no    CONTINUOUS GLUCOSE MONITORING RECORD INTERPRETATION    Dates of Recording: 4/19-01/08/2023  Sensor description:freestyle libre  Results statistics:   CGM use % of time 6  Average and SD 148/15.6  Time in range      91  %  % Time Above 180 9  % Time above 250 0  % Time Below target 0   Glycemic patterns summary: Bg's optimal at night , fluctuate during the day  Hyperglycemic episodes  post prandial  Hypoglycemic episodes occurred  n/a  Overnight periods: optimal       DIABETIC COMPLICATIONS: Microvascular complications:  Neuropathy Denies: CKD, retinopathy Last Eye Exam: Completed 2022  Macrovascular complications:   Denies: CAD, CVA, PVD   HISTORY:  Past Medical History:  Past Medical History:  Diagnosis Date   Anemia    Anxiety    Carpal tunnel syndrome    bilateral   Chronic heel pain    Diabetes mellitus without complication (HCC)    DJD (degenerative joint disease) of cervical spine    MRI 2017   Fibromyalgia    Hyperlipidemia    Hypertension    Plantar fasciitis    bilateral   Sickle cell trait (HCC)    Sleep apnea    Tendonitis    left hand/wrist   UC (ulcerative colitis) (HCC)    Past Surgical History:  Past Surgical History:  Procedure Laterality Date   ABDOMINAL HYSTERECTOMY     APPENDECTOMY     CESAREAN SECTION     x4   COLONOSCOPY     Multiple in [place]   ESOPHAGOGASTRODUODENOSCOPY     ROTATOR CUFF REPAIR Left    03/26/2022   Social History:  reports that she quit smoking about 16 years ago. Her smoking use included cigarettes. She has never used smokeless tobacco. She reports current alcohol use. She reports that she does not use drugs. Family  History:  Family History  Problem Relation Age of Onset   Stroke Mother    Diabetes Mother    Hypertension Mother    Lung cancer Mother    Sickle cell trait Mother    Sickle cell trait Father    Sickle cell anemia Sister 86   Sickle cell anemia Sister    Other Sister        accident and blood clot formed   Diabetes Maternal Aunt    Lung cancer Maternal Aunt    Lung cancer Maternal Aunt    Lung cancer Maternal Aunt    Lung cancer Maternal Grandmother    Colon cancer Cousin 30   Pancreatic cancer Neg Hx    Sleep apnea Neg Hx    Headache Neg Hx      HOME MEDICATIONS: Allergies as of 01/09/2023       Reactions   Sulfa Antibiotics Hives   Sulfamethoxazole-trimethoprim Hives, Itching   Codeine Itching, Rash,  Hives, Other (See Comments)   Latex Hives   With the power        Medication List        Accurate as of Jan 09, 2023  1:17 PM. If you have any questions, ask your nurse or doctor.          STOP taking these medications    benzonatate 100 MG capsule Commonly known as: TESSALON Stopped by: Scarlette Shorts, MD       TAKE these medications    Accu-Chek Aviva Plus test strip Generic drug: glucose blood Check sugar 3-4 times daily  E11.9   albuterol 108 (90 Base) MCG/ACT inhaler Commonly known as: VENTOLIN HFA Inhale 2 puffs into the lungs every 6 (six) hours as needed for wheezing or shortness of breath.   amLODipine 5 MG tablet Commonly known as: NORVASC TAKE 1 TABLET BY MOUTH EVERY DAY   Basaglar KwikPen 100 UNIT/ML Inject 26 Units into the skin at bedtime.   chlorzoxazone 500 MG tablet Commonly known as: PARAFON Take 500 mg by mouth 3 (three) times daily as needed.   dapagliflozin propanediol 10 MG Tabs tablet Commonly known as: Farxiga Take 1 tablet (10 mg total) by mouth daily.   diclofenac Sodium 1 % Gel Commonly known as: VOLTAREN Apply 4 g topically 4 (four) times daily as needed (pain).   dicyclomine 20 MG tablet Commonly known as: BENTYL Take 1 tablet (20 mg total) by mouth 3 (three) times daily as needed for spasms (abdominal cramping).   DULoxetine 60 MG capsule Commonly known as: CYMBALTA TAKE 1 CAPSULE BY MOUTH EVERY DAY   escitalopram 20 MG tablet Commonly known as: LEXAPRO Take 20 mg by mouth daily.   esomeprazole 40 MG capsule Commonly known as: NEXIUM TAKE 1 CAPSULE BY MOUTH EVERY DAY BEFORE BREAKFAST What changed: See the new instructions.   ezetimibe 10 MG tablet Commonly known as: ZETIA Take 10 mg by mouth daily.   fluticasone 50 MCG/ACT nasal spray Commonly known as: FLONASE SPRAY 2 SPRAYS INTO EACH NOSTRIL EVERY DAY What changed: See the new instructions.   FreeStyle [employer] 2 Sensor Misc CHANGE SENSOR EVERY 14  DAYS What changed: Another medication with the same name was removed. Continue taking this medication, and follow the directions you see here. Changed by: Scarlette Shorts, MD   gabapentin 300 MG capsule Commonly known as: NEURONTIN Take 300 mg by mouth at bedtime.   Insulin Pen Needle 32G X 4 MM Misc 1 Device by Does not apply  route in the morning, at noon, in the evening, and at bedtime.   lidocaine 2 % solution Commonly known as: XYLOCAINE Use as directed 15 mLs in the mouth or throat as needed for mouth pain.   lisinopril 10 MG tablet Commonly known as: ZESTRIL Take 10 mg by mouth daily.   loratadine 10 MG tablet Commonly known as: CLARITIN Take 10 mg by mouth daily.   meclizine 25 MG tablet Commonly known as: ANTIVERT TAKE 1 TABLET BY MOUTH 3 TIMES A DAY AS NEEDED FOR DIZZINESS   mesalamine 1.2 g EC tablet Commonly known as: LIALDA Take 2 tablets (2.4 g total) by mouth 2 (two) times daily.   methocarbamol 500 MG tablet Commonly known as: ROBAXIN Take 1 tablet (500 mg total) by mouth 2 (two) times daily.   metoCLOPramide 10 MG tablet Commonly known as: Reglan Take 1 tablet (10 mg total) by mouth 4 (four) times daily.   montelukast 10 MG tablet Commonly known as: SINGULAIR TAKE 1 TABLET BY MOUTH EVERYDAY AT BEDTIME   NONFORMULARY OR COMPOUNDED ITEM Swish and spit 5 mLs as needed (ulcer flare up in mouth). Magic mouthwash with lidocaine disp#150 ml   NovoLOG FlexPen 100 UNIT/ML FlexPen Generic drug: insulin aspart Max Daily 50 units   Olopatadine HCl 0.2 % Soln Apply 1 drop to eye daily as needed.   ondansetron 4 MG disintegrating tablet Commonly known as: Zofran ODT Take 1 tablet (4 mg total) by mouth every 8 (eight) hours as needed for nausea or vomiting.   OneTouch Delica Plus Lancet33G Misc 1 each by Does not apply route 3 (three) times daily. Dx E11.65, z79.4   Accu-Chek Softclix Lancets lancets CHECK SUGAR 3-4 TIMES DAILY E11.9 *PRIOR AUTH WAS  REQUIRED FOR INSURANCE TO PAY*   phenazopyridine 200 MG tablet Commonly known as: PYRIDIUM Take 1 tablet (200 mg total) by mouth 3 (three) times daily as needed for pain.   prochlorperazine 25 MG suppository Commonly known as: COMPAZINE Place 1 suppository (25 mg total) rectally every 12 (twelve) hours as needed for nausea or vomiting.   rosuvastatin 40 MG tablet Commonly known as: CRESTOR 1 tablet   saccharomyces boulardii 250 MG capsule Commonly known as: FLORASTOR See admin instructions.   senna-docusate 8.6-50 MG tablet Commonly known as: Senokot-S Take 1 tablet by mouth at bedtime as needed for mild constipation or moderate constipation.   triamcinolone cream 0.1 % Commonly known as: KENALOG Apply 1 application topically 2 (two) times daily.   WOMENS 50+ MULTI VITAMIN/MIN PO Take by mouth.         OBJECTIVE:   Vital Signs: BP 122/74 (BP Location: Left Arm, Patient Position: Sitting, Cuff Size: Small)   Pulse 88   Ht 5\' 2"  (1.575 m)   Wt 161 lb (73 kg)   SpO2 98%   BMI 29.45 kg/m   Wt Readings from Last 3 Encounters:  01/09/23 161 lb (73 kg)  07/04/22 163 lb 6.4 oz (74.1 kg)  06/30/22 163 lb 9.6 oz (74.2 kg)     Exam: General: Pt appears well and is in NAD  Lungs: Clear with good BS bilat with no rales, rhonchi, or wheezes  Heart: RRR   Abdomen: soft, nontender  Extremities: No pretibial edema.   Neuro: MS is good with appropriate affect, pt is alert and Ox3      DM Foot Exam 01/09/2023 The skin of the feet is intact without sores or ulcerations. The pedal pulses are 2+ on right and 2+ on  left. The sensation is intact to a screening 5.07, 10 gram monofilament bilaterally        DATA REVIEWED:  Lab Results  Component Value Date   HGBA1C 8.3 (A) 07/04/2022   HGBA1C 7.7 (A) 12/27/2021   HGBA1C 7.6 (A) 08/28/2021    Latest Reference Range & Units 03/16/22 08:00  Sodium 135 - 145 mmol/L 140  Potassium 3.5 - 5.1 mmol/L 3.5  Chloride 98 - 111  mmol/L 105  CO2 22 - 32 mmol/L 27  Glucose 70 - 99 mg/dL 409 (H)  BUN 6 - 20 mg/dL 10  Creatinine 8.11 - 9.14 mg/dL 7.82  Calcium 8.9 - 95.6 mg/dL 9.1  Anion gap 5 - 15  8  Alkaline Phosphatase 38 - 126 U/L 106  Albumin 3.5 - 5.0 g/dL 3.8  AST 15 - 41 U/L 32  ALT 0 - 44 U/L 29  Total Protein 6.5 - 8.1 g/dL 7.9  Total Bilirubin 0.3 - 1.2 mg/dL 0.5  GFR, Estimated >21 mL/min >60    ASSESSMENT / PLAN / RECOMMENDATIONS:   1) Type 2 Diabetes Mellitus, Optimally controlled, With Neuropathic complications - Most recent A1c of 6.9 % %. Goal A1c < 7.0 %.    -A1c remains above goal -She has been self reducing her insulin due to hypoglycemia -We discussed add-on therapy with GLP-1 agonist, caution against GI side effects -Will reduce prandial dose of insulin while on Ozempic  MEDICATIONS: -Start Ozempic 0.25 mg weekly for 6 weeks, then increase to 0.5 mg weekly -Continue Farxiga  10 mg, 1 tablet with breakfast  -Continue Basaglar 20 units daily  -Decrease Novolog 10 units with meals  -Continue CF : Novolog ( BG -130/30)  EDUCATION / INSTRUCTIONS: BG monitoring instructions: Patient is instructed to check her blood sugars 3 times a day, before meals . Call [place] [facility] if: BG persistently < 70 I reviewed the Rule of 15 for the treatment of hypoglycemia in detail with the patient. Literature supplied.   2) Diabetic complications:  Eye: Does not have known diabetic retinopathy.  Neuro/ Feet: Does have known diabetic peripheral neuropathy  with toe burning 12/2021 Renal: Patient does not have known baseline CKD. She   is on an ACEI/ARB at present.     F/U in 6 months    Signed electronically by: Lyndle Herrlich, MD  [facility]  [facility] [address] [place], [place] [address] Phone: [phone] FAX: [phone]   CC: Lorenda Ishihara, MD [address] [place] [address] Phone:  [phone]  Fax: [phone]  Return to Endocrinology clinic as below: Future Appointments  Date Time Provider Department Center  01/28/2023  2:15 PM Huston Foley, MD [facility] None  06/02/2023  3:30 PM Genia Del, MD [facility] None

## 2023-01-12 ENCOUNTER — Encounter: Payer: Self-pay | Admitting: Internal Medicine

## 2023-01-13 DIAGNOSIS — F4312 Post-traumatic stress disorder, chronic: Secondary | ICD-10-CM | POA: Diagnosis not present

## 2023-01-23 ENCOUNTER — Telehealth: Payer: Self-pay

## 2023-01-23 NOTE — Telephone Encounter (Signed)
Patient Advocate Encounter   Received notification from [facility] that prior authorization is required for Ozempic  Submitted: 01/23/23 Key [id number]  Status is pending

## 2023-01-25 NOTE — Telephone Encounter (Signed)
Pharmacy Patient Advocate Encounter  Prior Authorization has been APPROVED  Effective dates: 01/23/23 through 01/23/24  

## 2023-01-28 ENCOUNTER — Encounter: Payer: Self-pay | Admitting: Neurology

## 2023-01-28 ENCOUNTER — Telehealth: Payer: Self-pay | Admitting: Neurology

## 2023-01-28 ENCOUNTER — Ambulatory Visit (INDEPENDENT_AMBULATORY_CARE_PROVIDER_SITE_OTHER): Payer: PPO | Admitting: Neurology

## 2023-01-28 VITALS — BP 139/90 | HR 84 | Ht 63.0 in | Wt 161.0 lb

## 2023-01-28 DIAGNOSIS — G4733 Obstructive sleep apnea (adult) (pediatric): Secondary | ICD-10-CM

## 2023-01-28 DIAGNOSIS — R030 Elevated blood-pressure reading, without diagnosis of hypertension: Secondary | ICD-10-CM | POA: Diagnosis not present

## 2023-01-28 DIAGNOSIS — G4486 Cervicogenic headache: Secondary | ICD-10-CM
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
## 2023-01-28 NOTE — Patient Instructions (Addendum)
It was nice to see you again today. I am glad to hear, things are going well with your autoPAP therapy. You have adjusted well to treatment with your new machine, and you are compliant with it. You have also fulfilled the insurance-mandated compliance percentage, which is reassuring, so you can get ongoing supplies through your insurance. Please talk to your DME provider about getting replacement supplies on a regular basis. Please be sure to change your filter every month, your mask about every 3 months, hose about every 6 months, humidifier chamber about yearly. Some restrictions are imposed by your insurance carrier with regard to how frequently you can get certain supplies.  Your DME company can provide further details if necessary.  Please follow-up with Dr. Shon Baton regarding her neck pain which may trigger your headaches.  Follow-up with Dr. Conchita Paris for your meningioma.  Please follow-up with your primary care regarding blood pressure management as elevated blood pressure values can cause recurrent headaches as well.  Please try to keep the AutoPap mask on all night every night.   Please continue using your autoPAP regularly. While your insurance requires that you use PAP at least 4 hours each night on 70% of the nights, I recommend, that you not skip any nights and use it throughout the night if you can. Getting used to PAP and staying with the treatment long term does take time and patience and discipline. Untreated obstructive sleep apnea when it is moderate to severe can have an adverse impact on cardiovascular health and raise her risk for heart disease, arrhythmias, hypertension, congestive heart failure, stroke and diabetes. Untreated obstructive sleep apnea causes sleep disruption, nonrestorative sleep, and sleep deprivation. This can have an impact on your day to day functioning and cause daytime sleepiness and impairment of cognitive function, memory loss, mood disturbance, and problems  focussing. Using PAP regularly can improve these symptoms.  We can see you in 1 year, you can see one of our nurse practitioners as you are stable.

## 2023-01-28 NOTE — Telephone Encounter (Signed)
She needs to see her PCP for dizziness, could be related to elevated blood pressure.

## 2023-01-28 NOTE — Telephone Encounter (Signed)
Pt stated at check out that she forgot to ask in appointment if she should see Dr. Shon Baton or her PCP for dizziness. Please advise, thank you!

## 2023-01-29 NOTE — Telephone Encounter (Signed)
Ok thank you 

## 2023-01-29 NOTE — Telephone Encounter (Signed)
Called pt. Informed her of message Dr. Frances Furbish sent. Pt said she will give them a call today.

## 2023-01-30 DIAGNOSIS — M542 Cervicalgia: Secondary | ICD-10-CM | POA: Diagnosis not present

## 2023-02-06 DIAGNOSIS — F4312 Post-traumatic stress disorder, chronic: Secondary | ICD-10-CM | POA: Diagnosis not present

## 2023-02-24 DIAGNOSIS — E119 Type 2 diabetes mellitus without complications: Secondary | ICD-10-CM | POA: Diagnosis not present

## 2023-02-24 DIAGNOSIS — M542 Cervicalgia: Secondary | ICD-10-CM | POA: Diagnosis not present

## 2023-02-24 LAB — HM DIABETES EYE EXAM

## 2023-02-27 ENCOUNTER — Encounter: Payer: Self-pay | Admitting: Internal Medicine

## 2023-03-03 ENCOUNTER — Other Ambulatory Visit: Payer: Self-pay | Admitting: Internal Medicine

## 2023-03-03 DIAGNOSIS — M542 Cervicalgia: Secondary | ICD-10-CM | POA: Diagnosis not present

## 2023-03-05 DIAGNOSIS — M5412 Radiculopathy, cervical region: Secondary | ICD-10-CM | POA: Diagnosis not present

## 2023-03-17 ENCOUNTER — Other Ambulatory Visit: Payer: Self-pay

## 2023-03-17 ENCOUNTER — Emergency Department (HOSPITAL_COMMUNITY)
Admission: EM | Admit: 2023-03-17 | Discharge: 2023-03-17 | Disposition: A | Discharge status: Home / Self Care | Payer: PPO | Attending: Emergency Medicine | Admitting: Emergency Medicine

## 2023-03-17 ENCOUNTER — Encounter (HOSPITAL_COMMUNITY): Payer: Self-pay | Admitting: Emergency Medicine

## 2023-03-17 DIAGNOSIS — L237 Allergic contact dermatitis due to plants, except food: Secondary | ICD-10-CM | POA: Diagnosis not present

## 2023-03-17 DIAGNOSIS — Z794 Long term (current) use of insulin: Secondary | ICD-10-CM | POA: Diagnosis not present

## 2023-03-17 DIAGNOSIS — D72829 Elevated white blood cell count, unspecified: Secondary | ICD-10-CM | POA: Insufficient documentation

## 2023-03-17 DIAGNOSIS — G9520 Unspecified cord compression: Secondary | ICD-10-CM | POA: Diagnosis not present

## 2023-03-17 DIAGNOSIS — Z91040 Latex allergy status: Secondary | ICD-10-CM | POA: Insufficient documentation

## 2023-03-17 DIAGNOSIS — E162 Hypoglycemia, unspecified: Secondary | ICD-10-CM | POA: Diagnosis present

## 2023-03-17 DIAGNOSIS — E119 Type 2 diabetes mellitus without complications: Secondary | ICD-10-CM

## 2023-03-17 DIAGNOSIS — E876 Hypokalemia: Secondary | ICD-10-CM | POA: Insufficient documentation

## 2023-03-17 DIAGNOSIS — K5190 Ulcerative colitis, unspecified, without complications: Secondary | ICD-10-CM | POA: Diagnosis not present

## 2023-03-17 DIAGNOSIS — E11649 Type 2 diabetes mellitus with hypoglycemia without coma: Secondary | ICD-10-CM | POA: Diagnosis not present

## 2023-03-17 DIAGNOSIS — E1165 Type 2 diabetes mellitus with hyperglycemia: Secondary | ICD-10-CM | POA: Diagnosis not present

## 2023-03-17 LAB — CBC
HCT: 36 % (ref 36.0–46.0)
Hemoglobin: 12 g/dL (ref 12.0–15.0)
MCH: 28.6 pg (ref 26.0–34.0)
MCHC: 33.3 g/dL (ref 30.0–36.0)
MCV: 85.7 fL (ref 80.0–100.0)
Platelets: 353 10*3/uL (ref 150–400)
RBC: 4.2 MIL/uL (ref 3.87–5.11)
RDW: 13.2 % (ref 11.5–15.5)
WBC: 11.6 10*3/uL — ABNORMAL HIGH (ref 4.0–10.5)
nRBC: 0 % (ref 0.0–0.2)

## 2023-03-17 LAB — BASIC METABOLIC PANEL
Anion gap: 7 (ref 5–15)
BUN: 8 mg/dL (ref 8–23)
CO2: 26 mmol/L (ref 22–32)
Calcium: 8.8 mg/dL — ABNORMAL LOW (ref 8.9–10.3)
Chloride: 105 mmol/L (ref 98–111)
Creatinine, Ser: 0.92 mg/dL (ref 0.44–1.00)
GFR, Estimated: >60 mL/min (ref >60)
Glucose, Bld: 114 mg/dL — ABNORMAL HIGH (ref 70–99)
Potassium: 3.3 mmol/L — ABNORMAL LOW (ref 3.5–5.1)
Sodium: 138 mmol/L (ref 135–145)

## 2023-03-17 LAB — CBG MONITORING, ED: Glucose-Capillary: 130 mg/dL — ABNORMAL HIGH (ref 70–99)

## 2023-03-17 NOTE — Discharge Instructions (Signed)
You were seen in the emergency department for concerns of hypoglycemia.  After comparing your glucose levels with our monitors here in the emergency department, it appears that your glucose monitor might be reading artificially low.  I would advise following up with her primary care provider for further evaluation of her glucose monitor settings.  If you have any concerns or glucose levels dropping low again and you are beginning to experience symptoms of hypoglycemia such as weakness, confusion, nausea, or weakness, please return the emergency department.

## 2023-03-17 NOTE — ED Triage Notes (Signed)
Patient arrives ambulatory by POV c/o hypoglycemia according to her monitor. CBG currently reading 41 on personal monitor and 130 on hospital monitor. Reports eating and drinking per usual today. Denies any pain.

## 2023-03-18 NOTE — ED Provider Notes (Signed)
[place] EMERGENCY DEPARTMENT AT [facility] Provider Note   CSN: [id number] Arrival date & time: 03/17/23  1327     History Chief Complaint  Patient presents with   Hypoglycemia    Beverly Ramirez is a 62 y.o. female.  Patient with past history significant for type 2 diabetes, DKA, fibromyalgia presents emergency department concerns of hypoglycemia.  Patient reports that her home glucose monitor was showing readings of 41 and was concerned about possible hypoglycemia.  She reports that she does administer insulin, Basaglar and NovoLog, for sugar control.  Denies any changes in her dosing recently.  At time of her glucose monitor reading 41, she denies that she is experiencing any symptoms of hypoglycemia such as weakness, fatigue, dizziness, lightheadedness, nausea.  States that she has been having low readings on her monitor over the last few days and was concerned.  She responded by increasing sugar intake greatly with juice and food.  Reports the highest she is able to get her glucose meter at home the rate was up to 67.  On arrival here in the emergency department, her glucose monitor was still reading in the 50s but CBG performed at bedside with initial read at 130.  No other acute concerns at this time.   Hypoglycemia      Home Medications Prior to Admission medications   Medication Sig Start Date End Date Taking? Authorizing Provider  Accu-Chek Softclix Lancets lancets CHECK SUGAR 3-4 TIMES DAILY E11.9 *PRIOR AUTH WAS REQUIRED FOR INSURANCE TO PAY* 11/02/22   Shamleffer, Konrad Dolores, MD  albuterol (VENTOLIN HFA) 108 (90 Base) MCG/ACT inhaler Inhale 2 puffs into the lungs every 6 (six) hours as needed for wheezing or shortness of breath. 03/13/20   Lezlie Lye, Meda Coffee, MD  amLODipine (NORVASC) 5 MG tablet TAKE 1 TABLET BY MOUTH EVERY DAY 06/08/20   Lezlie Lye, Meda Coffee, MD  chlorzoxazone (PARAFON) 500 MG tablet Take 500 mg by mouth 3 (three) times daily as  needed. Patient not taking: Reported on 01/28/2023 08/12/21   [provider]  Continuous Glucose Receiver (FREESTYLE LIBRE 3 READER) DEVI 1 Device by Does not apply route every 14 (fourteen) days. 01/09/23   Shamleffer, Konrad Dolores, MD  Continuous Glucose Sensor (FREESTYLE LIBRE 3 SENSOR) MISC 1 Device by Does not apply route every 14 (fourteen) days. Place 1 sensor on the skin every 14 days. Use to check glucose continuously 01/09/23   Shamleffer, Konrad Dolores, MD  dapagliflozin propanediol (FARXIGA) 10 MG TABS tablet Take 1 tablet (10 mg total) by mouth daily. 10/26/20   Shamleffer, Konrad Dolores, MD  diclofenac Sodium (VOLTAREN) 1 % GEL Apply 4 g topically 4 (four) times daily as needed (pain). 03/16/22   Petrucelli, Samantha R, PA-C  dicyclomine (BENTYL) 20 MG tablet Take 1 tablet (20 mg total) by mouth 3 (three) times daily as needed for spasms (abdominal cramping). 10/18/20   Shade Flood, MD  DULoxetine (CYMBALTA) 60 MG capsule TAKE 1 CAPSULE BY MOUTH EVERY DAY 03/20/20   Lezlie Lye, Meda Coffee, MD  escitalopram (LEXAPRO) 20 MG tablet Take 20 mg by mouth daily. 05/07/22   [provider]  esomeprazole (NEXIUM) 40 MG capsule TAKE 1 CAPSULE BY MOUTH EVERY DAY BEFORE BREAKFAST Patient taking differently: Take by mouth as needed. 07/20/20   Iva Boop, MD  ezetimibe (ZETIA) 10 MG tablet Take 10 mg by mouth daily. 05/21/22   [provider]  fluticasone (FLONASE) 50 MCG/ACT nasal spray SPRAY 2 SPRAYS INTO  EACH NOSTRIL EVERY DAY Patient taking differently: Patient takes as needed 03/08/20   Lezlie Lye, Meda Coffee, MD  gabapentin (NEURONTIN) 300 MG capsule Take 300 mg by mouth at bedtime. Patient not taking: Reported on 01/28/2023 05/30/21   [provider]  glucose blood (ACCU-CHEK AVIVA PLUS) test strip Check sugar 3-4 times daily  E11.9 10/30/22   Shamleffer, Konrad Dolores, MD  insulin aspart (NOVOLOG FLEXPEN) 100 UNIT/ML FlexPen Max Daily 50 units Patient  taking differently: 10 Units 3 (three) times daily with meals. Also adds units per order, depending on blood sugar level 05/31/21   Shamleffer, Konrad Dolores, MD  Insulin Glargine (BASAGLAR KWIKPEN) 100 UNIT/ML Inject 20 Units into the skin at bedtime. 03/04/23   Shamleffer, Konrad Dolores, MD  Insulin Pen Needle 32G X 4 MM MISC 1 Device by Does not apply route in the morning, at noon, in the evening, and at bedtime. 10/30/22   Shamleffer, Konrad Dolores, MD  Lancets [facility] DELICA PLUS [place]) MISC 1 each by Does not apply route 3 (three) times daily. Dx E11.65, z79.4 01/24/19   Lezlie Lye, Meda Coffee, MD  lidocaine (XYLOCAINE) 2 % solution Use as directed 15 mLs in the mouth or throat as needed for mouth pain. 03/09/20   Iva Boop, MD  lisinopril (ZESTRIL) 10 MG tablet Take 10 mg by mouth daily. 08/07/21   [provider]  loratadine (CLARITIN) 10 MG tablet Take 10 mg by mouth daily. 05/10/22   [provider]  meclizine (ANTIVERT) 25 MG tablet TAKE 1 TABLET BY MOUTH 3 TIMES A DAY AS NEEDED FOR DIZZINESS    [provider]  mesalamine (LIALDA) 1.2 g EC tablet Take 2 tablets (2.4 g total) by mouth 2 (two) times daily. 04/28/20 01/28/23  Iva Boop, MD  methocarbamol (ROBAXIN) 500 MG tablet Take 1 tablet (500 mg total) by mouth 2 (two) times daily. Patient taking differently: Take 500 mg by mouth 2 (two) times daily. If needed 08/10/22   Debby Freiberg, NP  metoCLOPramide (REGLAN) 10 MG tablet Take 1 tablet (10 mg total) by mouth 4 (four) times daily. 08/05/21   Meredith Pel, NP  montelukast (SINGULAIR) 10 MG tablet TAKE 1 TABLET BY MOUTH EVERYDAY AT BEDTIME 07/08/22   Verlee Monte, MD  Multiple Vitamins-Minerals (WOMENS 50+ MULTI VITAMIN/MIN PO) Take by mouth.    [provider]  NONFORMULARY OR COMPOUNDED ITEM Swish and spit 5 mLs as needed (ulcer flare up in mouth). Magic mouthwash with lidocaine disp#150 ml 05/04/20   Iva Boop, MD   Olopatadine HCl 0.2 % SOLN Apply 1 drop to eye daily as needed. 01/01/22   Verlee Monte, MD  ondansetron (ZOFRAN ODT) 4 MG disintegrating tablet Take 1 tablet (4 mg total) by mouth every 8 (eight) hours as needed for nausea or vomiting. 05/08/20   Long, Arlyss Repress, MD  phenazopyridine (PYRIDIUM) 200 MG tablet Take 1 tablet (200 mg total) by mouth 3 (three) times daily as needed for pain. Patient not taking: Reported on 01/28/2023 04/02/21   Particia Nearing, PA-C  prochlorperazine (COMPAZINE) 25 MG suppository Place 1 suppository (25 mg total) rectally every 12 (twelve) hours as needed for nausea or vomiting. Patient not taking: Reported on 01/28/2023 07/16/21   Meredith Pel, NP  rosuvastatin (CRESTOR) 40 MG tablet 1 tablet    [provider]  saccharomyces boulardii (FLORASTOR) 250 MG capsule See admin instructions. When needed 06/24/21   [provider]  Semaglutide,0.25 or  0.5MG /DOS, 2 MG/3ML SOPN Inject 0.5 mg into the skin once a week. 01/09/23   Shamleffer, Konrad Dolores, MD  senna-docusate (SENOKOT-S) 8.6-50 MG tablet Take 1 tablet by mouth at bedtime as needed for mild constipation or moderate constipation. 02/11/21   Long, Arlyss Repress, MD  triamcinolone cream (KENALOG) 0.1 % Apply 1 application topically 2 (two) times daily. 04/19/20   Noni Saupe, MD      Allergies    Sulfa antibiotics, Sulfamethoxazole-trimethoprim, Codeine, and Latex    Review of Systems   Review of Systems  Gastrointestinal:  Positive for nausea.  All other systems reviewed and are negative.   Physical Exam Updated Vital Signs BP (!) 135/91 (BP Location: Left Arm)   Pulse 83   Temp 98.5 F (36.9 C) (Oral)   Resp 16   Ht 5\' 3"  (1.6 m)   Wt 73 kg   SpO2 97%   BMI 28.52 kg/m  Physical Exam Vitals and nursing note reviewed.  Constitutional:      General: She is not in acute distress.    Appearance: She is well-developed.  HENT:     Head: Normocephalic and atraumatic.   Eyes:     Conjunctiva/sclera: Conjunctivae normal.  Cardiovascular:     Rate and Rhythm: Normal rate and regular rhythm.     Heart sounds: No murmur heard. Pulmonary:     Effort: Pulmonary effort is normal. No respiratory distress.     Breath sounds: Normal breath sounds.  Abdominal:     Palpations: Abdomen is soft.     Tenderness: There is no abdominal tenderness.  Musculoskeletal:        General: No swelling.     Cervical back: Neck supple.  Skin:    General: Skin is warm and dry.     Capillary Refill: Capillary refill takes less than 2 seconds.  Neurological:     Mental Status: She is alert.  Psychiatric:        Mood and Affect: Mood normal.     ED Results / Procedures / Treatments   Labs (all labs ordered are listed, but only abnormal results are displayed) Labs Reviewed  CBC - Abnormal; Notable for the following components:      Result Value   WBC 11.6 (*)    All other components within normal limits  BASIC METABOLIC PANEL - Abnormal; Notable for the following components:   Potassium 3.3 (*)    Glucose, Bld 114 (*)    Calcium 8.8 (*)    All other components within normal limits  CBG MONITORING, ED - Abnormal; Notable for the following components:   Glucose-Capillary 130 (*)    All other components within normal limits    EKG None  Radiology No results found.  Procedures Procedures   Medications Ordered in ED Medications - No data to display  ED Course/ Medical Decision Making/ A&P                           Medical Decision Making Amount and/or Complexity of Data Reviewed Labs: ordered.   This patient presents to the ED for concern of hypoglycemia.  Differential diagnosis includes insulin overdose, malnutrition, poor oral intake, CBG error   Lab Tests:  I Ordered, and personally interpreted labs.  The pertinent results include: CBG 130, CBC with mild leukocytosis at 11.6, BMP with mild hypokalemia 3.3, glucose of 114   Problem List / ED  Course:  Patient presents to  the emergency department concerns of hypoglycemia.  She reports that her home CBG reading was at 41 prior to arriving in the emergency department, was asymptomatic at this time.  Reports patient having some difficulty with her home reader for the last few days but reports that she has been replacing the sensor as recommended.  States that she recently switched from 1 CBG monitor to another based on PCP recommendations.  Did not check her glucose level with a fingerstick monitor at home although she does have 1 available to her.  Try to replenish sugar with oral intake including juice and food.  Reports that her glucose was able to elevate into the mid 60s but nothing beyond that.  On arrival to the emergency department, patient was first triage and had CBG assessed with our monitor which showed that she was at 138 while her daughter was simultaneously showing that she was in the upper 67s.  Concerned that patient may have an issue with her CBG reader reading artificially low.  Will evaluate with CBC and CMP regardless for further assessment of glucose or any other possible abnormalities to account for patient's possible decrease in glucose. CBC and CMP are unremarkable.  Glucose at 114 which is still significantly high that her monitor is reading at bedside which is currently showing low 60s.  Her blood count is also slightly elevated 11.6 but no evidence of any fevers, pain, or source of infection.  Advised patient that she follow-up with primary care provider for further evaluation of her glucose monitor as I believe there may be concerns at the monitor is falsely reading low.  At this time, do not believe the patient would benefit from ministration of dextrose or any other hypoglycemic medications as her glucose is completely normal and stable.  No evidence that patient is deranged and no signs of patient has actually taken more of her insulin than recommended.  No acute or  emergent condition identified here in the emergency department requires further evaluation or treatment or admission.  Patient is agreeable to treatment plan verbalized understanding all return precautions.  All questions answered prior to patient discharge.  Final Clinical Impression(s) / ED Diagnoses Final diagnoses:  Type 2 diabetes mellitus treated with insulin [facility])    Rx / DC Orders ED Discharge Orders     None         Smitty Knudsen, PA-C 03/18/23 0850    Benjiman Core, MD 03/18/23 [phone]

## 2023-03-24 ENCOUNTER — Telehealth: Payer: Self-pay

## 2023-03-24 DIAGNOSIS — M5412 Radiculopathy, cervical region: Secondary | ICD-10-CM | POA: Diagnosis not present

## 2023-03-24 NOTE — Telephone Encounter (Signed)
Transition Care Management Unsuccessful Follow-up Telephone Call  Date of discharge and from where:  [facility]  Attempts:  1st Attempt  Reason for unsuccessful TCM follow-up call:  No answer/busy   [facility] For[facility] Guide, [facility] [phone] [address] [place], [place], [place] [address] Phone: [phone] Email: [email or web address]

## 2023-03-24 NOTE — Telephone Encounter (Signed)
Transition Care Management Unsuccessful Follow-up Telephone Call  Date of discharge and from where:  [facility]  Attempts:  2nd Attempt  Reason for unsuccessful TCM follow-up call:  No answer/busy   [facility]ard Forth [facility] Guide, [facility] [phone] [address] [place], [place], [place] [address] Phone: [phone] Email: [email or web address]

## 2023-04-09 DIAGNOSIS — R300 Dysuria: Secondary | ICD-10-CM | POA: Diagnosis not present

## 2023-04-21 DIAGNOSIS — G4733 Obstructive sleep apnea (adult) (pediatric): Secondary | ICD-10-CM | POA: Diagnosis not present

## 2023-04-23 ENCOUNTER — Other Ambulatory Visit (HOSPITAL_BASED_OUTPATIENT_CLINIC_OR_DEPARTMENT_OTHER): Payer: Self-pay | Admitting: Internal Medicine

## 2023-04-23 ENCOUNTER — Encounter (HOSPITAL_BASED_OUTPATIENT_CLINIC_OR_DEPARTMENT_OTHER): Payer: Self-pay | Admitting: Internal Medicine

## 2023-04-23 DIAGNOSIS — M549 Dorsalgia, unspecified: Secondary | ICD-10-CM

## 2023-04-23 DIAGNOSIS — R300 Dysuria: Secondary | ICD-10-CM | POA: Diagnosis not present

## 2023-04-23 DIAGNOSIS — R319 Hematuria, unspecified: Secondary | ICD-10-CM | POA: Diagnosis not present

## 2023-04-24 ENCOUNTER — Ambulatory Visit (HOSPITAL_COMMUNITY): Admission: RE | Admit: 2023-04-24 | Payer: PPO | Source: Ambulatory Visit

## 2023-04-24 ENCOUNTER — Encounter (HOSPITAL_COMMUNITY): Payer: Self-pay

## 2023-04-24 DIAGNOSIS — N289 Disorder of kidney and ureter, unspecified: Secondary | ICD-10-CM | POA: Insufficient documentation

## 2023-04-24 DIAGNOSIS — M549 Dorsalgia, unspecified: Secondary | ICD-10-CM | POA: Diagnosis not present

## 2023-04-24 DIAGNOSIS — R300 Dysuria: Secondary | ICD-10-CM | POA: Diagnosis not present

## 2023-04-24 DIAGNOSIS — M160 Bilateral primary osteoarthritis of hip: Secondary | ICD-10-CM | POA: Diagnosis not present

## 2023-04-24 DIAGNOSIS — I700 Atherosclerosis of aorta: Secondary | ICD-10-CM | POA: Diagnosis not present

## 2023-04-24 DIAGNOSIS — I517 Cardiomegaly: Secondary | ICD-10-CM | POA: Insufficient documentation

## 2023-04-24 DIAGNOSIS — N3289 Other specified disorders of bladder: Secondary | ICD-10-CM | POA: Diagnosis not present

## 2023-04-24 MED ORDER — SODIUM CHLORIDE (PF) 0.9 % IJ SOLN
INTRAMUSCULAR | Status: AC
  Filled 2023-04-24: qty 50

## 2023-04-24 MED ORDER — IOHEXOL 300 MG/ML  SOLN
100.0000 mL | Freq: Once | INTRAMUSCULAR | Status: AC | PRN
  Administered 2023-04-24: 100 mL via INTRAVENOUS

## 2023-04-27 DIAGNOSIS — M5412 Radiculopathy, cervical region: Secondary | ICD-10-CM | POA: Diagnosis not present

## 2023-05-06 DIAGNOSIS — N3091 Cystitis, unspecified with hematuria: Secondary | ICD-10-CM | POA: Diagnosis not present

## 2023-05-20 DIAGNOSIS — R42 Dizziness and giddiness: Secondary | ICD-10-CM | POA: Diagnosis not present

## 2023-05-20 DIAGNOSIS — M542 Cervicalgia: Secondary | ICD-10-CM | POA: Diagnosis not present

## 2023-05-22 DIAGNOSIS — G4733 Obstructive sleep apnea (adult) (pediatric): Secondary | ICD-10-CM | POA: Diagnosis not present

## 2023-06-02 ENCOUNTER — Ambulatory Visit: Payer: HMO | Admitting: Nurse Practitioner

## 2023-06-02 ENCOUNTER — Ambulatory Visit: Payer: HMO | Admitting: Obstetrics & Gynecology

## 2023-06-05 ENCOUNTER — Encounter: Payer: Self-pay | Admitting: Internal Medicine

## 2023-06-05 ENCOUNTER — Ambulatory Visit: Payer: PPO | Admitting: Internal Medicine

## 2023-06-05 VITALS — BP 138/92 | HR 62 | Ht 63.0 in | Wt 160.0 lb

## 2023-06-05 DIAGNOSIS — M797 Fibromyalgia: Secondary | ICD-10-CM

## 2023-06-05 DIAGNOSIS — R110 Nausea: Secondary | ICD-10-CM | POA: Diagnosis not present

## 2023-06-05 DIAGNOSIS — K5100 Ulcerative (chronic) pancolitis without complications: Secondary | ICD-10-CM

## 2023-06-05 MED ORDER — NA SULFATE-K SULFATE-MG SULF 17.5-3.13-1.6 GM/177ML PO SOLN: 1.0000 | Freq: Once | ORAL | 0 refills | Status: AC

## 2023-06-05 MED ORDER — MESALAMINE 1.2 G PO TBEC: 2.4000 g | DELAYED_RELEASE_TABLET | Freq: Two times a day (BID) | ORAL | 3 refills | Status: DC

## 2023-06-05 MED ORDER — ONDANSETRON 4 MG PO TBDP: 4.0000 mg | ORAL_TABLET | Freq: Three times a day (TID) | ORAL | 2 refills | Status: AC | PRN

## 2023-06-05 NOTE — Patient Instructions (Signed)
You have been scheduled for a colonoscopy. Please follow written instructions given to you at your visit today.   Please pick up your prep supplies at the pharmacy within the next 1-3 days.  If you use inhalers (even only as needed), please bring them with you on the day of your procedure.  DO NOT TAKE 7 DAYS PRIOR TO TEST- Trulicity (dulaglutide) Ozempic, Wegovy (semaglutide) Mounjaro (tirzepatide) Bydureon Bcise (exanatide extended release)  DO NOT TAKE 1 DAY PRIOR TO YOUR TEST Rybelsus (semaglutide) Adlyxin (lixisenatide) Victoza (liraglutide) Byetta (exanatide) ___________________________________________________________________________  We have sent the following medications to your pharmacy for you to pick up at your convenience: Mesalamine, ondansetron  TAKE YOUR CYMBALTA DAILY PLEASE.  I appreciate the opportunity to care for you. Stan Head, MD, [facility]

## 2023-06-05 NOTE — Progress Notes (Signed)
Beverly Ramirez 62 y.o. 10/13/1960 [id number]  Assessment & Plan:   Encounter Diagnoses  Name Primary?   Ulcerative pancolitis without complication (HCC) Yes   Chronic nausea    Fibromyalgia    Schedule colonoscopy to reassess ulcerative colitis and continue mesalamine therapy at current dose.  Ondansetron prescription to be used intermittently for nausea.  I have explained to her that etiology of nausea is not clear.  I do agree with pursuing ENT evaluation since she has some dizziness and perhaps there is an inner ear disturbance related.  We had some discussion of fibromyalgia and how the duloxetine would be more helpful if taken chronically and every day.  I cleaned up her medication list today as well there were many medications on there that had not been taken in years.   Meds ordered this encounter  Medications   Na Sulfate-K Sulfate-Mg Sulf 17.5-3.13-1.6 GM/177ML SOLN    Sig: Take 1 kit by mouth once for 1 dose.    Dispense:  354 mL    Refill:  0   ondansetron (ZOFRAN ODT) 4 MG disintegrating tablet    Sig: Take 1 tablet (4 mg total) by mouth every 8 (eight) hours as needed for nausea or vomiting.    Dispense:  20 tablet    Refill:  2   mesalamine (LIALDA) 1.2 g EC tablet    Sig: Take 2 tablets (2.4 g total) by mouth 2 (two) times daily.    Dispense:  360 tablet    Refill:  3   CC: Lorenda Ishihara, MD   Subjective:  Gastroenterology summary:  Ulcerative colitis Dx 2007 [place] - hospitalized and then Tx ASA 6 MP x years Dr. Michele Mcalpine off meds Readmitted 2016 - Tx IV steroids Then Entyvio - did well with UC but had some nausea, increased LFT's Colonoscopy mild rectal edema w/ micro colitis in ascending only Neg EGD Changed to Humira 2017 - rapid response - did well but moved to [place] and insurance change and could not afford Humira CRP and ESR do rise w/ flares  Began care with Dr. Leone Payor 2018 insurance change could not continue Humira treated with  mesalamine and had done well.  2020 colonoscopy in remission.  2022 colonoscopy (December) in remission.  Chronic nausea unclear etiology EGD 2022 erythematous antrum biopsies with slight inflammation only and no H. pylori  Chief Complaint: Follow-up of ulcerative colitis and she is complaining of persistent nausea  HPI 62 year old African-American woman with the above issues as outlined, who was complaining of persistent nausea which has been a chronic recurrent issue with her.  She feels like anytime her abdomen is touched she is nauseous. Needs mesalamine refill.  Zofran will help nausea but it will not fix it.  She is on duloxetine for fibromyalgia but does not take it every day.  Was treated with Ozempic for her type 2 diabetes but stopped it after 2 months because she thinks "I am on too many medications".  Plans to talk with her primary care provider about that at upcoming visit.  There is some dizziness and an ENT referral may be in the works.  I do not get a sense of vertigo.  At this point she is not having diarrhea or abdominal pain or rectal bleeding. Chronic pain issues along with the fibromyalgia.   Lab Results  Component Value Date   NA 138 03/17/2023   CL 105 03/17/2023   K 3.3 (L) 03/17/2023   CO2 26 03/17/2023  BUN 8 03/17/2023   CREATININE 0.92 03/17/2023   GFRNONAA >60 03/17/2023   CALCIUM 8.8 (L) 03/17/2023   ALBUMIN 3.8 03/16/2022   GLUCOSE 114 (H) 03/17/2023   Lab Results  Component Value Date   WBC 11.6 (H) 03/17/2023   HGB 12.0 03/17/2023   HCT 36.0 03/17/2023   MCV 85.7 03/17/2023   PLT 353 03/17/2023      Allergies  Allergen Reactions   Sulfa Antibiotics Hives   Sulfamethoxazole-Trimethoprim Hives and Itching   Codeine Itching, Rash, Hives and Other (See Comments)   Latex Hives    With the power   Current Meds  Medication Sig   Accu-Chek Softclix Lancets lancets CHECK SUGAR 3-4 TIMES DAILY E11.9 *PRIOR AUTH WAS REQUIRED FOR INSURANCE TO PAY*    albuterol (VENTOLIN HFA) 108 (90 Base) MCG/ACT inhaler Inhale 2 puffs into the lungs every 6 (six) hours as needed for wheezing or shortness of breath.   amLODipine (NORVASC) 5 MG tablet TAKE 1 TABLET BY MOUTH EVERY DAY   chlorzoxazone (PARAFON) 500 MG tablet Take 500 mg by mouth 3 (three) times daily as needed.   Continuous Glucose Receiver (FREESTYLE LIBRE 3 READER) DEVI 1 Device by Does not apply route every 14 (fourteen) days.   Continuous Glucose Sensor (FREESTYLE LIBRE 3 SENSOR) MISC 1 Device by Does not apply route every 14 (fourteen) days. Place 1 sensor on the skin every 14 days. Use to check glucose continuously   dapagliflozin propanediol (FARXIGA) 10 MG TABS tablet Take 1 tablet (10 mg total) by mouth daily.   diclofenac Sodium (VOLTAREN) 1 % GEL Apply 4 g topically 4 (four) times daily as needed (pain).   dicyclomine (BENTYL) 20 MG tablet Take 1 tablet (20 mg total) by mouth 3 (three) times daily as needed for spasms (abdominal cramping).   DULoxetine (CYMBALTA) 60 MG capsule TAKE 1 CAPSULE BY MOUTH EVERY DAY   escitalopram (LEXAPRO) 20 MG tablet Take 20 mg by mouth daily.   esomeprazole (NEXIUM) 40 MG capsule TAKE 1 CAPSULE BY MOUTH EVERY DAY BEFORE BREAKFAST (Patient taking differently: Take by mouth as needed.)   ezetimibe (ZETIA) 10 MG tablet Take 10 mg by mouth daily.   fluticasone (FLONASE) 50 MCG/ACT nasal spray SPRAY 2 SPRAYS INTO EACH NOSTRIL EVERY DAY (Patient taking differently: Patient takes as needed)   insulin aspart (NOVOLOG FLEXPEN) 100 UNIT/ML FlexPen Max Daily 50 units (Patient taking differently: 10 Units 3 (three) times daily with meals. Also adds units per order, depending on blood sugar level)   Insulin Glargine (BASAGLAR KWIKPEN) 100 UNIT/ML Inject 20 Units into the skin at bedtime.   Insulin Pen Needle 32G X 4 MM MISC 1 Device by Does not apply route in the morning, at noon, in the evening, and at bedtime.   Lancets (ONETOUCH DELICA PLUS LANCET33G) MISC 1 each  by Does not apply route 3 (three) times daily. Dx E11.65, z79.4   lisinopril (ZESTRIL) 10 MG tablet Take 10 mg by mouth daily.   loratadine (CLARITIN) 10 MG tablet Take 10 mg by mouth daily.   meclizine (ANTIVERT) 25 MG tablet TAKE 1 TABLET BY MOUTH 3 TIMES A DAY AS NEEDED FOR DIZZINESS   methocarbamol (ROBAXIN) 500 MG tablet Take 1 tablet (500 mg total) by mouth 2 (two) times daily. (Patient taking differently: Take 500 mg by mouth 2 (two) times daily. If needed)   metoCLOPramide (REGLAN) 10 MG tablet Take 1 tablet (10 mg total) by mouth 4 (four) times daily.   montelukast (SINGULAIR)  10 MG tablet TAKE 1 TABLET BY MOUTH EVERYDAY AT BEDTIME   Multiple Vitamins-Minerals (WOMENS 50+ MULTI VITAMIN/MIN PO) Take by mouth.   NONFORMULARY OR COMPOUNDED ITEM Swish and spit 5 mLs as needed (ulcer flare up in mouth). Magic mouthwash with lidocaine disp#150 ml   Olopatadine HCl 0.2 % SOLN Apply 1 drop to eye daily as needed.   phenazopyridine (PYRIDIUM) 200 MG tablet Take 1 tablet (200 mg total) by mouth 3 (three) times daily as needed for pain.   rosuvastatin (CRESTOR) 40 MG tablet 1 tablet   triamcinolone cream (KENALOG) 0.1 % Apply 1 application topically 2 (two) times daily.   Past Medical History:  Diagnosis Date   Anemia    Anxiety    Carpal tunnel syndrome    bilateral   Chronic heel pain    Diabetes mellitus without complication (HCC)    DJD (degenerative joint disease) of cervical spine    MRI 2017   Fibromyalgia    Hyperlipidemia    Hypertension    Plantar fasciitis    bilateral   Sickle cell trait (HCC)    Sleep apnea    Tendonitis    left hand/wrist   UC (ulcerative colitis) (HCC)    Past Surgical History:  Procedure Laterality Date   ABDOMINAL HYSTERECTOMY     APPENDECTOMY     CESAREAN SECTION     x4   COLONOSCOPY     Multiple in [place]   ESOPHAGOGASTRODUODENOSCOPY     ROTATOR CUFF REPAIR Left    03/26/2022   Social History   Social History Narrative   She is  married, 3 sons born 1979, 2, 64.  One daughter born in 42.   She is medically retired from the [employer] after a fall and a hip injury, around 2004.   Caffeine: tea and coffee once in awhile          family history includes Colon cancer (age of onset: 47) in her cousin; Diabetes in her maternal aunt and mother; Hypertension in her mother; Lung cancer in her maternal aunt, maternal aunt, maternal aunt, maternal grandmother, and mother; Other in her sister; Sickle cell anemia in her sister; Sickle cell anemia (age of onset: 74) in her sister; Sickle cell trait in her father and mother; Stroke in her mother.   Review of Systems Burning eyes  Objective:   Physical Exam @BP  (!) 138/92 (BP Location: Left Arm, Patient Position: Sitting, Cuff Size: Normal)   Pulse 62   Ht 5\' 3"  (1.6 m)   Wt 160 lb (72.6 kg)   SpO2 99%   BMI 28.34 kg/m @  General:  NAD Eyes:   anicteric Lungs:  clear Heart::  S1S2 no rubs, murmurs or gallops Abdomen:  soft and mildly diffusely tender but benign (this is chronic) Ext:   no edema, cyanosis or clubbing    Data Reviewed:

## 2023-06-06 ENCOUNTER — Emergency Department (HOSPITAL_COMMUNITY)
Admission: EM | Admit: 2023-06-06 | Discharge: 2023-06-07 | Disposition: A | Discharge status: Home / Self Care | Payer: PPO | Attending: Emergency Medicine | Admitting: Emergency Medicine

## 2023-06-06 ENCOUNTER — Other Ambulatory Visit: Payer: Self-pay

## 2023-06-06 ENCOUNTER — Encounter (HOSPITAL_COMMUNITY): Payer: Self-pay

## 2023-06-06 DIAGNOSIS — Z91040 Latex allergy status: Secondary | ICD-10-CM | POA: Insufficient documentation

## 2023-06-06 DIAGNOSIS — Z794 Long term (current) use of insulin: Secondary | ICD-10-CM | POA: Insufficient documentation

## 2023-06-06 DIAGNOSIS — Z79899 Other long term (current) drug therapy: Secondary | ICD-10-CM | POA: Insufficient documentation

## 2023-06-06 DIAGNOSIS — E119 Type 2 diabetes mellitus without complications: Secondary | ICD-10-CM | POA: Insufficient documentation

## 2023-06-06 DIAGNOSIS — R519 Headache, unspecified: Secondary | ICD-10-CM | POA: Diagnosis not present

## 2023-06-06 DIAGNOSIS — R5383 Other fatigue: Secondary | ICD-10-CM | POA: Diagnosis not present

## 2023-06-06 DIAGNOSIS — H538 Other visual disturbances: Secondary | ICD-10-CM | POA: Diagnosis not present

## 2023-06-06 DIAGNOSIS — I6502 Occlusion and stenosis of left vertebral artery: Secondary | ICD-10-CM | POA: Diagnosis not present

## 2023-06-06 DIAGNOSIS — I6782 Cerebral ischemia: Secondary | ICD-10-CM | POA: Diagnosis not present

## 2023-06-06 DIAGNOSIS — Z7984 Long term (current) use of oral hypoglycemic drugs: Secondary | ICD-10-CM | POA: Diagnosis not present

## 2023-06-06 DIAGNOSIS — R42 Dizziness and giddiness: Secondary | ICD-10-CM | POA: Diagnosis not present

## 2023-06-06 DIAGNOSIS — I10 Essential (primary) hypertension: Secondary | ICD-10-CM | POA: Insufficient documentation

## 2023-06-06 DIAGNOSIS — I6529 Occlusion and stenosis of unspecified carotid artery: Secondary | ICD-10-CM | POA: Diagnosis not present

## 2023-06-06 DIAGNOSIS — J45909 Unspecified asthma, uncomplicated: Secondary | ICD-10-CM | POA: Diagnosis not present

## 2023-06-06 DIAGNOSIS — I672 Cerebral atherosclerosis: Secondary | ICD-10-CM | POA: Diagnosis not present

## 2023-06-06 LAB — CBG MONITORING, ED: Glucose-Capillary: 137 mg/dL — ABNORMAL HIGH (ref 70–99)

## 2023-06-06 NOTE — ED Triage Notes (Signed)
Pt reports with fatigue and dizziness since today and states that her head feels weird. Pt states that she sees light flashing.

## 2023-06-07 ENCOUNTER — Emergency Department (HOSPITAL_COMMUNITY): Payer: PPO

## 2023-06-07 DIAGNOSIS — I6502 Occlusion and stenosis of left vertebral artery: Secondary | ICD-10-CM | POA: Diagnosis not present

## 2023-06-07 DIAGNOSIS — I6782 Cerebral ischemia: Secondary | ICD-10-CM | POA: Diagnosis not present

## 2023-06-07 DIAGNOSIS — I672 Cerebral atherosclerosis: Secondary | ICD-10-CM | POA: Diagnosis not present

## 2023-06-07 DIAGNOSIS — I6529 Occlusion and stenosis of unspecified carotid artery: Secondary | ICD-10-CM | POA: Diagnosis not present

## 2023-06-07 LAB — BASIC METABOLIC PANEL
Anion gap: 9 (ref 5–15)
BUN: 14 mg/dL (ref 8–23)
CO2: 22 mmol/L (ref 22–32)
Calcium: 8.6 mg/dL — ABNORMAL LOW (ref 8.9–10.3)
Chloride: 107 mmol/L (ref 98–111)
Creatinine, Ser: 0.67 mg/dL (ref 0.44–1.00)
GFR, Estimated: >60 mL/min (ref >60)
Glucose, Bld: 136 mg/dL — ABNORMAL HIGH (ref 70–99)
Potassium: 3 mmol/L — ABNORMAL LOW (ref 3.5–5.1)
Sodium: 138 mmol/L (ref 135–145)

## 2023-06-07 LAB — CBC
HCT: 35.6 % — ABNORMAL LOW (ref 36.0–46.0)
Hemoglobin: 11.7 g/dL — ABNORMAL LOW (ref 12.0–15.0)
MCH: 28.4 pg (ref 26.0–34.0)
MCHC: 32.9 g/dL (ref 30.0–36.0)
MCV: 86.4 fL (ref 80.0–100.0)
Platelets: 375 10*3/uL (ref 150–400)
RBC: 4.12 MIL/uL (ref 3.87–5.11)
RDW: 13.2 % (ref 11.5–15.5)
WBC: 10.4 10*3/uL (ref 4.0–10.5)
nRBC: 0 % (ref 0.0–0.2)

## 2023-06-07 MED ORDER — DIPHENHYDRAMINE HCL 50 MG/ML IJ SOLN
12.5000 mg | Freq: Once | INTRAMUSCULAR | Status: AC
  Administered 2023-06-07: 12.5 mg via INTRAVENOUS
  Filled 2023-06-07: qty 1

## 2023-06-07 MED ORDER — SODIUM CHLORIDE 0.9 % IV BOLUS
1000.0000 mL | Freq: Once | INTRAVENOUS | Status: AC
  Administered 2023-06-07: 1000 mL via INTRAVENOUS

## 2023-06-07 MED ORDER — IOHEXOL 350 MG/ML SOLN
75.0000 mL | Freq: Once | INTRAVENOUS | Status: AC | PRN
  Administered 2023-06-07: 75 mL via INTRAVENOUS

## 2023-06-07 MED ORDER — PROCHLORPERAZINE EDISYLATE 10 MG/2ML IJ SOLN
10.0000 mg | Freq: Once | INTRAMUSCULAR | Status: AC
  Administered 2023-06-07: 10 mg via INTRAVENOUS
  Filled 2023-06-07: qty 2

## 2023-06-07 MED ORDER — KETOROLAC TROMETHAMINE 15 MG/ML IJ SOLN
15.0000 mg | Freq: Once | INTRAMUSCULAR | Status: AC
  Administered 2023-06-07: 15 mg via INTRAVENOUS
  Filled 2023-06-07: qty 1

## 2023-06-07 NOTE — Discharge Instructions (Addendum)
During the workup we noted incidental findings on your imaging that would require you to follow-up with your regular doctor for further evaluation/management:  5 mm left upper lobe nodule, indeterminate. Per [employer]  Society Guidelines, if patient is low risk for malignancy, no  routine follow-up imaging is recommended. If patient is high risk  for malignancy, a non-contrast Chest CT at 12 months is optional. If  performed and the nodule is stable at 12 months, no further  follow-up is recommended. These guidelines do not apply to  immunocompromised patients and patients with cancer. Follow up in  patients with significant comorbidities as clinically warranted. For  lung cancer screening, adhere to Lung-RADS guidelines

## 2023-06-07 NOTE — ED Provider Notes (Signed)
EMERGENCY DEPARTMENT AT [facility] Provider Note  CSN: [id number] Arrival date & time: 06/06/23 2320  Chief Complaint(s) Fatigue and Dizziness  HPI Beverly Ramirez is a 62 y.o. female with a past medical history listed below who presents to the emergency department with occipital and nuchal headache that is been ongoing for several days intermittently and became worse this evening.  Patient reports similar headaches in the past but not as severe.  With a this evening, she has associated dizziness feeling like she is unstable and generalized fatigue.  She denies any fevers or chills.  No coughing or congestion.  No nausea or vomiting.  She denies any trauma.  She did report having intermittent blurry vision.  No unilateral weakness or loss of sensation.  The history is provided by the patient.    Past Medical History Past Medical History:  Diagnosis Date   Anemia    Anxiety    Carpal tunnel syndrome    bilateral   Chronic heel pain    Diabetes mellitus without complication (HCC)    DJD (degenerative joint disease) of cervical spine    MRI 2017   Fibromyalgia    Hyperlipidemia    Hypertension    Plantar fasciitis    bilateral   Sickle cell trait (HCC)    Sleep apnea    Tendonitis    left hand/wrist   UC (ulcerative colitis) (HCC)    Patient Active Problem List   Diagnosis Date Noted   Allergic conjunctivitis of both eyes 01/01/2022   Seasonal and perennial allergic rhinitis 01/01/2022   Latex allergy 01/01/2022   Allergy to sulfa drugs 01/01/2022   Mild intermittent asthma 01/01/2022   Type 2 diabetes mellitus with hyperglycemia, with long-term current use of insulin (HCC) 05/31/2020   Fibromyalgia 05/31/2020   Mouth ulcers 05/25/2020   Elevated glucose 12/23/2019   Diabetic ketoacidosis without coma associated with type 2 diabetes mellitus (HCC) 12/23/2019   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   De Quervain's tenosynovitis, left 11/16/2018   Bilateral  carpal tunnel syndrome 04/13/2018   Leukocytosis 10/04/2017   Hyperlipidemia 08/04/2017   Ulcerative colitis with complication (HCC) 07/13/2017   Plantar fasciitis 05/28/2017   Home Medication(s) Prior to Admission medications   Medication Sig Start Date End Date Taking? Authorizing Provider  albuterol (VENTOLIN HFA) 108 (90 Base) MCG/ACT inhaler Inhale 2 puffs into the lungs every 6 (six) hours as needed for wheezing or shortness of breath. 03/13/20  Yes Lezlie Lye, Meda Coffee, MD  amLODipine (NORVASC) 5 MG tablet TAKE 1 TABLET BY MOUTH EVERY DAY 06/08/20  Yes Lezlie Lye, Meda Coffee, MD  chlorzoxazone (PARAFON) 500 MG tablet Take 500 mg by mouth 3 (three) times daily as needed for muscle spasms. 08/12/21  Yes [provider]  dapagliflozin propanediol (FARXIGA) 10 MG TABS tablet Take 1 tablet (10 mg total) by mouth daily. 10/26/20  Yes Shamleffer, Konrad Dolores, MD  DULoxetine (CYMBALTA) 60 MG capsule TAKE 1 CAPSULE BY MOUTH EVERY DAY 03/20/20  Yes Lezlie Lye, Irma M, MD  escitalopram (LEXAPRO) 20 MG tablet Take 20 mg by mouth daily. 05/07/22  Yes [provider]  esomeprazole (NEXIUM) 40 MG capsule TAKE 1 CAPSULE BY MOUTH EVERY DAY BEFORE BREAKFAST Patient taking differently: Take 40 mg by mouth daily as needed (indigestion). 07/20/20  Yes Iva Boop, MD  ezetimibe (ZETIA) 10 MG tablet Take 10 mg by mouth daily. 05/21/22  Yes [provider]  fluticasone (FLONASE) 50 MCG/ACT nasal spray SPRAY 2  SPRAYS INTO EACH NOSTRIL EVERY DAY Patient taking differently: Place 1 spray into both nostrils daily as needed for allergies. Patient takes as needed 03/08/20  Yes Lezlie Lye, Meda Coffee, MD  hydrOXYzine (ATARAX) 25 MG tablet Take 25 mg by mouth every 8 (eight) hours as needed. 03/17/23  Yes [provider]  insulin aspart (NOVOLOG FLEXPEN) 100 UNIT/ML FlexPen Max Daily 50 units Patient taking differently: Inject 10 Units into the skin 3 (three) times daily with meals.  Also adds units per order, depending on blood sugar level 05/31/21  Yes Shamleffer, Konrad Dolores, MD  Insulin Glargine (BASAGLAR KWIKPEN) 100 UNIT/ML Inject 20 Units into the skin at bedtime. 03/04/23  Yes Shamleffer, Konrad Dolores, MD  lisinopril (ZESTRIL) 10 MG tablet Take 10 mg by mouth daily. 08/07/21  Yes [provider]  loratadine (CLARITIN) 10 MG tablet Take 10 mg by mouth daily. 05/10/22  Yes [provider]  meclizine (ANTIVERT) 25 MG tablet TAKE 1 TABLET BY MOUTH 3 TIMES A DAY AS NEEDED FOR DIZZINESS   Yes [provider]  mesalamine (LIALDA) 1.2 g EC tablet Take 2 tablets (2.4 g total) by mouth 2 (two) times daily. Patient taking differently: Take 4.8 g by mouth daily. 06/05/23 05/30/24 Yes Iva Boop, MD  methocarbamol (ROBAXIN) 750 MG tablet Take 750 mg by mouth every 6 (six) hours as needed for muscle spasms.   Yes [provider]  montelukast (SINGULAIR) 10 MG tablet TAKE 1 TABLET BY MOUTH EVERYDAY AT BEDTIME 07/08/22  Yes Verlee Monte, MD  Multiple Vitamins-Minerals (WOMENS 50+ MULTI VITAMIN/MIN PO) Take 1 tablet by mouth daily.   Yes [provider]  ondansetron (ZOFRAN ODT) 4 MG disintegrating tablet Take 1 tablet (4 mg total) by mouth every 8 (eight) hours as needed for nausea or vomiting. 06/05/23  Yes Iva Boop, MD  rosuvastatin (CRESTOR) 40 MG tablet Take 40 mg by mouth daily.   Yes [provider]  Semaglutide,0.25 or 0.5MG /DOS, 2 MG/3ML SOPN Inject 0.5 mg into the skin once a week. 01/09/23  Yes Shamleffer, Konrad Dolores, MD  Accu-Chek Softclix Lancets lancets CHECK SUGAR 3-4 TIMES DAILY E11.9 *PRIOR AUTH WAS REQUIRED FOR INSURANCE TO PAY* 11/02/22   Shamleffer, Konrad Dolores, MD  Continuous Glucose Receiver (FREESTYLE LIBRE 3 READER) DEVI 1 Device by Does not apply route every 14 (fourteen) days. 01/09/23   Shamleffer, Konrad Dolores, MD  Continuous Glucose Sensor (FREESTYLE LIBRE 3 SENSOR) MISC 1 Device by Does  not apply route every 14 (fourteen) days. Place 1 sensor on the skin every 14 days. Use to check glucose continuously 01/09/23   Shamleffer, Konrad Dolores, MD  glucose blood (ACCU-CHEK AVIVA PLUS) test strip Check sugar 3-4 times daily  E11.9 10/30/22   Shamleffer, Konrad Dolores, MD  Insulin Pen Needle 32G X 4 MM MISC 1 Device by Does not apply route in the morning, at noon, in the evening, and at bedtime. 10/30/22   Shamleffer, Konrad Dolores, MD  Lancets [facility] DELICA PLUS [place]) MISC 1 each by Does not apply route 3 (three) times daily. Dx E11.65, z79.4 01/24/19   Lezlie Lye, Meda Coffee, MD  methocarbamol (ROBAXIN) 500 MG tablet Take 1 tablet (500 mg total) by mouth 2 (two) times daily. Patient not taking: Reported on 06/07/2023 08/10/22   Debby Freiberg, NP  triamcinolone cream (KENALOG) 0.1 % Apply 1 application topically 2 (two) times daily. Patient not taking: Reported on 06/07/2023 04/19/20   Lezlie Lye, Meda Coffee, MD  Allergies Sulfa antibiotics, Sulfamethoxazole-trimethoprim, Codeine, and Latex  Review of Systems Review of Systems As noted in HPI  Physical Exam Vital Signs  I have reviewed the triage vital signs BP (!) 134/94 (BP Location: Left Arm)   Pulse 71   Temp 97.8 F (36.6 C) (Oral)   Resp 20   Ht 5\' 2"  (1.575 m)   Wt 72.6 kg   SpO2 98%   BMI 29.26 kg/m   Physical Exam Vitals reviewed.  Constitutional:      General: She is not in acute distress.    Appearance: She is well-developed. She is not diaphoretic.  HENT:     Head: Normocephalic and atraumatic.     Right Ear: External ear normal.     Left Ear: External ear normal.     Nose: Nose normal.  Eyes:     General: No scleral icterus.    Conjunctiva/sclera: Conjunctivae normal.  Neck:     Trachea: Phonation normal.  Cardiovascular:     Rate and Rhythm: Regular rhythm.   Pulmonary:     Effort: Pulmonary effort is normal. No respiratory distress.     Breath sounds: No stridor.  Abdominal:     General: There is no distension.  Musculoskeletal:        General: Normal range of motion.     Cervical back: Normal range of motion. No rigidity. Pain with movement and muscular tenderness present. No spinous process tenderness.  Lymphadenopathy:     Cervical: No cervical adenopathy.  Neurological:     Mental Status: She is alert and oriented to person, place, and time.     Comments: Mental Status:  Alert and oriented to person, place, and time.  Attention and concentration normal.  Speech clear.  Recent memory is intact  Cranial Nerves:  II Visual Fields: Intact to confrontation. Visual fields intact. III, IV, VI: Pupils equal and reactive to light and near. Full eye movement without nystagmus  V Facial Sensation: Normal. No weakness of masticatory muscles  VII: No facial weakness or asymmetry  VIII Auditory Acuity: Grossly normal  IX/X: The uvula is midline; the palate elevates symmetrically  XI: Normal sternocleidomastoid and trapezius strength  XII: The tongue is midline. No atrophy or fasciculations.   Motor System: Muscle Strength: 5/5 and symmetric in the upper and lower extremities. No pronation or drift.  Muscle Tone: Tone and muscle bulk are normal in the upper and lower extremities.  Reflexes: No Clonus Coordination: Intact finger-to-nose.  No tremor.  Sensation: Intact to light touch, and pinprick. Negative Romberg test.  Gait: ataxic   Psychiatric:        Behavior: Behavior normal.     ED Results and Treatments Labs (all labs ordered are listed, but only abnormal results are displayed) Labs Reviewed  BASIC METABOLIC PANEL - Abnormal; Notable for the following components:      Result Value   Potassium 3.0 (*)    Glucose, Bld 136 (*)    Calcium 8.6 (*)    All other components within normal limits  CBC - Abnormal; Notable for the  following components:   Hemoglobin 11.7 (*)    HCT 35.6 (*)    All other components within normal limits  CBG MONITORING, ED - Abnormal; Notable for the following components:   Glucose-Capillary 137 (*)    All other components within normal limits  EKG  EKG Interpretation Date/Time:  Saturday June 06 2023 23:31:58 EDT Ventricular Rate:  70 PR Interval:  173 QRS Duration:  79 QT Interval:  406 QTC Calculation: 439 R Axis:   -5  Text Interpretation: Sinus rhythm Abnormal R-wave progression, early transition No acute changes Confirmed by Drema Pry [phone]) on 06/07/2023 3:32:22 AM       Radiology CT ANGIO HEAD NECK W WO CM  Result Date: 06/07/2023 CLINICAL DATA:  Initial evaluation for ataxia. The deltoid knee filled solidity of radiologist EXAM: CT ANGIOGRAPHY HEAD AND NECK WITH AND WITHOUT CONTRAST TECHNIQUE: Multidetector CT imaging of the head and neck was performed using the standard protocol during bolus administration of intravenous contrast. Multiplanar CT image reconstructions and MIPs were obtained to evaluate the vascular anatomy. Carotid stenosis measurements (when applicable) are obtained utilizing NASCET criteria, using the distal internal carotid diameter as the denominator. RADIATION DOSE REDUCTION: This exam was performed according to the departmental dose-optimization program which includes automated exposure control, adjustment of the mA and/or kV according to patient size and/or use of iterative reconstruction technique. CONTRAST:  75mL OMNIPAQUE IOHEXOL 350 MG/ML SOLN COMPARISON:  Prior study from 09/05/2021 FINDINGS: CT HEAD FINDINGS Brain: Cerebral volume within normal limits. Mild chronic microvascular ischemic disease for age. No acute intracranial hemorrhage. No acute large vessel territory infarct. No mass lesion or midline shift. No  hydrocephalus or extra-axial fluid collection. Vascular: No abnormal hyperdense vessel. Skull: Scalp soft tissues demonstrate no acute finding. Calvarium intact. Sinuses/Orbits: Globes and orbital soft tissues within normal limits. Scattered mucosal thickening present about the frontoethmoidal and maxillary sinuses. No mastoid effusion. Other: None. Review of the MIP images confirms the above findings CTA NECK FINDINGS Aortic arch: Aortic arch within normal limits for caliber. Mild atheromatous change about the arch itself. Bovine branching pattern noted. No stenosis about the origin the great vessels. Right carotid system: Right common and internal carotid arteries are patent without dissection or stenosis. Left carotid system: Left common and internal carotid arteries are patent without stenosis or dissection. Vertebral arteries: Both vertebral arteries arise from the subclavian arteries. No proximal subclavian artery stenosis. Right vertebral artery dominant. Moderate stenosis noted at the origin of the left vertebral artery. Vertebral arteries are otherwise patent distally without stenosis or dissection. Skeleton: No discrete or worrisome osseous lesions. Mild multilevel cervical spondylosis, most pronounced at C5-6. Other neck: No other acute finding. Few scattered thyroid nodules noted, largest of which measures 1 cm on the right. These are of doubtful significance given size and patient age, no follow-up imaging recommended (ref: J Am Coll Radiol. 2015 Feb;12(2): 143-50). Upper chest: 5 mm left upper lobe nodule (series 11, image 3), indeterminate. No other acute finding. Review of the MIP images confirms the above findings CTA HEAD FINDINGS Anterior circulation: Atheromatous change seen about the carotid siphons without hemodynamically significant stenosis. A1 segments patent bilaterally. Normal anterior communicating artery complex. Anterior cerebral arteries patent without stenosis. Right M1 segment widely  patent. Atheromatous irregularity about the left M1 segment with associated moderate distal left M1 stenosis (series 17, image 44). No proximal MCA branch occlusion or high-grade stenosis. Distal MCA branches perfused and symmetric. Posterior circulation: Both V4 segments mildly irregular but patent without significant stenosis. Left PICA patent. Right PICA not well seen. Atheromatous irregularity about the basilar with mild stenosis at its mid aspect (series 18, image 25). Superior cerebral arteries patent bilaterally. Both PCAs primarily supplied via the basilar. PCAs are widely patent to their distal aspects without stenosis.  Venous sinuses: Patent allowing for timing the contrast bolus. Anatomic variants: As above.  No aneurysm. Review of the MIP images confirms the above findings IMPRESSION: CT HEAD IMPRESSION: 1. No acute intracranial abnormality. 2. Mild chronic microvascular ischemic disease for age. CTA HEAD AND NECK IMPRESSION: 1. Negative CTA for large vessel occlusion or other emergent finding. 2. Atheromatous change about the left M1 segment with associated moderate distal left M1 stenosis. 3. Moderate stenosis at the origin of the left vertebral artery. Right vertebral artery dominant and widely patent. 4. Mild stenosis about the mid basilar artery. 5.  Aortic Atherosclerosis (ICD10-I70.0). 6. 5 mm left upper lobe nodule, indeterminate. Per Fleischner Society Guidelines, if patient is low risk for malignancy, no routine follow-up imaging is recommended. If patient is high risk for malignancy, a non-contrast Chest CT at 12 months is optional. If performed and the nodule is stable at 12 months, no further follow-up is recommended. These guidelines do not apply to immunocompromised patients and patients with cancer. Follow up in patients with significant comorbidities as clinically warranted. For lung cancer screening, adhere to Lung-RADS guidelines. Reference: Radiology. 2017; 284(1):228-43.  Electronically Signed   By: Rise Mu M.D.   On: 06/07/2023 03:37    Medications Ordered in ED Medications  prochlorperazine (COMPAZINE) injection 10 mg (10 mg Intravenous Given 06/07/23 0202)  diphenhydrAMINE (BENADRYL) injection 12.5 mg (12.5 mg Intravenous Given 06/07/23 0203)  sodium chloride 0.9 % bolus 1,000 mL (0 mLs Intravenous Stopped 06/07/23 0740)  iohexol (OMNIPAQUE) 350 MG/ML injection 75 mL (75 mLs Intravenous Contrast Given 06/07/23 0224)  ketorolac (TORADOL) 15 MG/ML injection 15 mg (15 mg Intravenous Given 06/07/23 0535)   Procedures Procedures  (including critical care time) Medical Decision Making / ED Course   Medical Decision Making Amount and/or Complexity of Data Reviewed Labs: ordered. Decision-making details documented in ED Course. Radiology: ordered and independent interpretation performed. Decision-making details documented in ED Course. ECG/medicine tests: ordered and independent interpretation performed. Decision-making details documented in ED Course.  Risk Prescription drug management.    No fevers concerning for meningitis.  No other signs of infectious etiology. CBC without leukocytosis.  Mild anemia. Metabolic panel with mild hypokalemia.  No other significant electrolyte derangements or renal sufficiency. EKG without acute ischemic changes, dysrhythmias or blocks. Nonfocal exam but patient is mildly ataxic.  CTA ruled out acute vascular process, no ICH or mass effect.  Treated with migraine cocktail which provided some relief.  Ataxia resolved. Able to ambulate well. Patient does have a history of cervical radiculopathy which may be contributing to cervicogenic/occipital neuralgia.     Final Clinical Impression(s) / ED Diagnoses Final diagnoses:  Bad headache  Dizziness   The patient appears reasonably screened and/or stabilized for discharge and I doubt any other medical condition or other [facility] requiring further screening, evaluation,  or treatment in the ED at this time. I have discussed the findings, Dx and Tx plan with the patient/family who expressed understanding and agree(s) with the plan. Discharge instructions discussed at length. The patient/family was given strict return precautions who verbalized understanding of the instructions. No further questions at time of discharge.  Disposition: Discharge  Condition: Good  ED Discharge Orders     None        Follow Up: Lorenda Ishihara, MD [address] [place] [address] [phone]  Call  to schedule an appointment for close follow up    This chart was dictated using voice recognition software.  Despite best efforts  to proofread,  errors can occur which can change the documentation meaning.    Nira Conn, MD 06/07/23 [phone]

## 2023-06-15 ENCOUNTER — Encounter: Payer: Self-pay | Admitting: Internal Medicine

## 2023-06-20 ENCOUNTER — Encounter: Payer: Self-pay | Admitting: Certified Registered Nurse Anesthetist

## 2023-06-21 DIAGNOSIS — G4733 Obstructive sleep apnea (adult) (pediatric): Secondary | ICD-10-CM | POA: Diagnosis not present

## 2023-06-22 ENCOUNTER — Ambulatory Visit (INDEPENDENT_AMBULATORY_CARE_PROVIDER_SITE_OTHER): Payer: PPO | Admitting: Internal Medicine

## 2023-06-22 ENCOUNTER — Encounter: Payer: Self-pay | Admitting: Internal Medicine

## 2023-06-22 VITALS — BP 126/82 | HR 76 | Ht 62.0 in | Wt 161.0 lb

## 2023-06-22 DIAGNOSIS — Z794 Long term (current) use of insulin: Secondary | ICD-10-CM | POA: Diagnosis not present

## 2023-06-22 DIAGNOSIS — E876 Hypokalemia: Secondary | ICD-10-CM | POA: Insufficient documentation

## 2023-06-22 DIAGNOSIS — E1142 Type 2 diabetes mellitus with diabetic polyneuropathy: Secondary | ICD-10-CM | POA: Diagnosis not present

## 2023-06-22 DIAGNOSIS — M79671 Pain in right foot: Secondary | ICD-10-CM | POA: Diagnosis not present

## 2023-06-22 DIAGNOSIS — M79672 Pain in left foot: Secondary | ICD-10-CM

## 2023-06-22 LAB — POCT GLYCOSYLATED HEMOGLOBIN (HGB A1C): Hemoglobin A1C: 6.7 % — AB (ref 4.0–5.6)

## 2023-06-22 LAB — VITAMIN B12: Vitamin B-12: 602 pg/mL (ref 211–911)

## 2023-06-22 LAB — CORTISOL: Cortisol, Plasma: 4.4 ug/dL

## 2023-06-22 LAB — POTASSIUM: Potassium: 4.1 meq/L (ref 3.5–5.1)

## 2023-06-22 LAB — VITAMIN D 25 HYDROXY (VIT D DEFICIENCY, FRACTURES): VITD: 10.56 ng/mL — ABNORMAL LOW (ref 30.00–100.00)

## 2023-06-22 MED ORDER — DAPAGLIFLOZIN PROPANEDIOL 10 MG PO TABS: 10.0000 mg | ORAL_TABLET | Freq: Every day | ORAL | 3 refills | Status: DC

## 2023-06-22 NOTE — Patient Instructions (Addendum)
-  Ozempic 0.5 mg weekly  -Continue Farxiga 10 mg daily  -Continue  Basaglar 20  units daily  -Decrease  Novolog 8  units with meals  -Novolog correctional insulin: ADD extra units on insulin to your meal-time Novolog dose if your blood sugars are higher than 160. USE BEFORE YOU EAT   Blood sugar before meal Number of units to inject  Less than 160 0 unit  161 - 190 1 units  191 - 220 2 units  221 -  250 3 units  251 -  280 4 units  281 -  310 5 units    HOW TO TREAT LOW BLOOD SUGARS (Blood sugar LESS THAN 70 MG/DL) Please follow the RULE OF 15 for the treatment of hypoglycemia treatment (when your (blood sugars are less than 70 mg/dL)   STEP 1: Take 15 grams of carbohydrates when your blood sugar is low, which includes:  4 GLUCOSE TABS  OR 4 OZ OF JUICE OR REGULAR SODA OR ONE TUBE OF GLUCOSE GEL    STEP 2: RECHECK blood sugar in 15 MINUTES STEP 3: If your blood sugar is still low at the 15 minute recheck --> then, go back to STEP 1 and treat AGAIN with another 15 grams of carbohydrates    SALIVARY CORTISOL COLLECTION INSTRUCTIONS    Precautions:  Please collect sample at Midnight. You will need to do this on 2 nights  No food or fluids 30 minutes prior to collection.  Do not use any creams, lotions on hands, or use steroid inhalers 24- hours prior to collection.  Wash hands carefully.  Avoid any activity that could cause your gums to bleed: including flushing of brushing your teeth.  Kit must not be used in children less than 63 years of age, or a person that is at risk for choking on collection kit.  Instructions for saliva collection:   Rinse mouth thoroughly with water and discard. Do not swallow.  Hold the Salivette at the rim of the suspended insert and remove the stopper.  Remove the swab.  Place swab under tongue until well saturated, approximately 1 minute.   Return the saturated swab to the suspended insert and close the Salivette firmly with the stopper.  Do not  remove the tube holding the insert. The Salivette should be sent to the lab with the swab.   Come to the lab and leave the Salivette kit for labeling with your identifying information.   Make sure you refrigerate sample if not bringing to the lab immediately. Try to use cold packs for transportation if available.

## 2023-06-22 NOTE — Progress Notes (Unsigned)
Name: Beverly Ramirez  Age/ Sex: 62 y.o., female   MRN/ DOB: [id number], [birth year]     PCP: Lorenda Ishihara, MD   Reason for Endocrinology Evaluation: Type 2 Diabetes Mellitus  Initial Endocrine Consultative Visit: 07/23/2020    PATIENT IDENTIFIER: [name] is a 62 y.o. female with a past medical history of T2DM, OSA, fibromyalgia and UC. The patient has followed with Endocrinology clinic since 07/23/2020 for consultative assistance with management of her diabetes.  DIABETIC HISTORY:  [name] was diagnosed with DM in 2019.Has been on insulin intermittently while on prednisone,  Metformin- too sick. Marland Kitchen Her hemoglobin A1c has ranged from 7.1%in 2021 , peaking at 9.5% in 2020   On her initial visit to our clinic she had an A1c of 7.2 % she was on basal insulin and Novolog, We added farxiga   Started Ozempic 01/2023   SUBJECTIVE:   During the last visit (01/09/2023): A1c 6.9 %    Today (06/22/2023): [name] is here for a follow up diabetes management.  She checks her blood sugars multiple  times a day through freestyle libre . 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 complications  Weight stable  She stopped Ozempic for blurry vision and itchy eyes , continues with   Has chronic nausea  but no vomiting , she is not sure if this is related to colitis  Denies constipation or diarrhea   Has feet pain  Noted with hypokalemia    HOME DIABETES REGIMEN:  Farxiga 10 mg, 1 tablet with breakfast  Ozempic 0.5 mg weekly Basaglar 20 units daily  Novolog 10 units TIDQAC CF : Novolog ( BG -130/30)     Statin: yes ACE-I/ARB: no    CONTINUOUS GLUCOSE MONITORING RECORD INTERPRETATION    Dates of Recording: 7/28-10/25/2024  Sensor description:freestyle libre  Results statistics:   CGM use % of time 62  Average and SD 153/26.9  Time in range 74 %  % Time Above 180  23  % Time above 250 2  % Time Below target 1   Glycemic patterns summary: Hyperglycemic episodes  post prandial  Hypoglycemic episodes occurred n/a  Overnight periods: optimal       DIABETIC COMPLICATIONS: Microvascular complications:  Neuropathy Denies: CKD, retinopathy Last Eye Exam: Completed 02/24/2023  Macrovascular complications:   Denies: CAD, CVA, PVD   HISTORY:  Past Medical History:  Past Medical History:  Diagnosis Date   Anemia    Anxiety    Carpal tunnel syndrome    bilateral   Chronic heel pain    Diabetes mellitus without complication (HCC)    DJD (degenerative joint disease) of cervical spine    MRI 2017   Fibromyalgia    Hyperlipidemia    Hypertension    Plantar fasciitis    bilateral   Sickle cell trait (HCC)    Sleep apnea    Tendonitis    left hand/wrist   UC (ulcerative colitis) (HCC)    Past Surgical History:  Past Surgical History:  Procedure Laterality Date   ABDOMINAL HYSTERECTOMY     APPENDECTOMY     CESAREAN SECTION     x4   COLONOSCOPY     Multiple in [place]   ESOPHAGOGASTRODUODENOSCOPY     ROTATOR CUFF REPAIR Left    03/26/2022   Social History:  reports that she quit smoking about 16 years ago. Her smoking use included cigarettes. She has  never used smokeless tobacco. She reports current alcohol use. She reports that she does not use drugs. Family History:  Family History  Problem Relation Age of Onset   Stroke Mother    Diabetes Mother    Hypertension Mother    Lung cancer Mother    Sickle cell trait Mother    Sickle cell trait Father    Sickle cell anemia Sister 33   Sickle cell anemia Sister    Other Sister        accident and blood clot formed   Diabetes Maternal Aunt    Lung cancer Maternal Aunt    Lung cancer Maternal Aunt    Lung cancer Maternal Aunt    Lung cancer Maternal Grandmother    Colon cancer Cousin 30   Pancreatic cancer Neg Hx    Sleep apnea Neg Hx    Headache Neg Hx      HOME  MEDICATIONS: Allergies as of 06/22/2023       Reactions   Sulfa Antibiotics Hives   Sulfamethoxazole-trimethoprim Hives, Itching   Codeine Itching, Rash, Hives, Other (See Comments)   Latex Hives   With the power        Medication List        Accurate as of June 22, 2023  1:12 PM. If you have any questions, ask your nurse or doctor.          Accu-Chek Aviva Plus test strip Generic drug: glucose blood Check sugar 3-4 times daily  E11.9   albuterol 108 (90 Base) MCG/ACT inhaler Commonly known as: VENTOLIN HFA Inhale 2 puffs into the lungs every 6 (six) hours as needed for wheezing or shortness of breath.   amLODipine 5 MG tablet Commonly known as: NORVASC TAKE 1 TABLET BY MOUTH EVERY DAY   Basaglar KwikPen 100 UNIT/ML Inject 20 Units into the skin at bedtime.   chlorzoxazone 500 MG tablet Commonly known as: PARAFON Take 500 mg by mouth 3 (three) times daily as needed for muscle spasms.   cyclobenzaprine 10 MG tablet Commonly known as: FLEXERIL Take 1 tablet by mouth at bedtime.   dapagliflozin propanediol 10 MG Tabs tablet Commonly known as: Farxiga Take 1 tablet (10 mg total) by mouth daily.   DULoxetine 60 MG capsule Commonly known as: CYMBALTA TAKE 1 CAPSULE BY MOUTH EVERY DAY   escitalopram 20 MG tablet Commonly known as: LEXAPRO Take 20 mg by mouth daily.   esomeprazole 40 MG capsule Commonly known as: NEXIUM TAKE 1 CAPSULE BY MOUTH EVERY DAY BEFORE BREAKFAST What changed: See the new instructions.   ezetimibe 10 MG tablet Commonly known as: ZETIA Take 10 mg by mouth daily.   famotidine 20 MG tablet Commonly known as: PEPCID TK 1 T PO  Q 12 H   fluticasone 50 MCG/ACT nasal spray Commonly known as: FLONASE SPRAY 2 SPRAYS INTO EACH NOSTRIL EVERY DAY What changed: See the new instructions.   FreeStyle Libre 3 Reader Devi 1 Device by Does not apply route every 14 (fourteen) days.   FreeStyle Libre 3 Sensor Misc 1 Device by Does not  apply route every 14 (fourteen) days. Place 1 sensor on the skin every 14 days. Use to check glucose continuously   gabapentin 100 MG capsule Commonly known as: NEURONTIN TK ONE C PO  TID   hydrOXYzine 25 MG tablet Commonly known as: ATARAX Take 25 mg by mouth every 8 (eight) hours as needed.   Insulin Pen Needle 32G X 4 MM Misc 1 Device  by Does not apply route in the morning, at noon, in the evening, and at bedtime.   lisinopril 10 MG tablet Commonly known as: ZESTRIL Take 10 mg by mouth daily.   loratadine 10 MG tablet Commonly known as: CLARITIN Take 10 mg by mouth daily.   meclizine 25 MG tablet Commonly known as: ANTIVERT TAKE 1 TABLET BY MOUTH 3 TIMES A DAY AS NEEDED FOR DIZZINESS   mercaptopurine 50 MG tablet Commonly known as: PURINETHOL TK 2 TS PO QD   Asacol HD 800 MG Tbec Generic drug: Mesalamine TK TWO TS PO Q 8 H What changed: Another medication with the same name was changed. Make sure you understand how and when to take each.   mesalamine 1.2 g EC tablet Commonly known as: LIALDA Take 2 tablets (2.4 g total) by mouth 2 (two) times daily. What changed:  how much to take when to take this   methocarbamol 750 MG tablet Commonly known as: ROBAXIN Take 750 mg by mouth every 6 (six) hours as needed for muscle spasms.   methocarbamol 500 MG tablet Commonly known as: ROBAXIN Take 1 tablet (500 mg total) by mouth 2 (two) times daily.   montelukast 10 MG tablet Commonly known as: SINGULAIR TAKE 1 TABLET BY MOUTH EVERYDAY AT BEDTIME   Na Sulfate-K Sulfate-Mg Sulf 17.5-3.13-1.6 GM/177ML Soln See admin instructions.   NovoLOG FlexPen 100 UNIT/ML FlexPen Generic drug: insulin aspart Max Daily 50 units What changed:  how much to take how to take this when to take this additional instructions   ondansetron 4 MG disintegrating tablet Commonly known as: Zofran ODT Take 1 tablet (4 mg total) by mouth every 8 (eight) hours as needed for nausea or  vomiting.   OneTouch Delica Plus Lancet33G Misc 1 each by Does not apply route 3 (three) times daily. Dx E11.65, z79.4   Accu-Chek Softclix Lancets lancets CHECK SUGAR 3-4 TIMES DAILY E11.9 *PRIOR AUTH WAS REQUIRED FOR INSURANCE TO PAY*   rosuvastatin 40 MG tablet Commonly known as: CRESTOR Take 40 mg by mouth daily.   Semaglutide(0.25 or 0.5MG /DOS) 2 MG/3ML Sopn Inject 0.5 mg into the skin once a week.   traMADol 50 MG tablet Commonly known as: ULTRAM TK 1 T PO  TID   triamcinolone cream 0.1 % Commonly known as: KENALOG Apply 1 application topically 2 (two) times daily.   WOMENS 50+ MULTI VITAMIN/MIN PO Take 1 tablet by mouth daily.         OBJECTIVE:   Vital Signs: BP 126/82 (BP Location: Right Arm, Patient Position: Sitting, Cuff Size: Small)   Pulse 76   Ht 5\' 2"  (1.575 m)   Wt 161 lb (73 kg)   SpO2 99%   BMI 29.45 kg/m   Wt Readings from Last 3 Encounters:  06/22/23 161 lb (73 kg)  06/06/23 160 lb (72.6 kg)  06/05/23 160 lb (72.6 kg)     Exam: General: Pt appears well and is in NAD  Lungs: Clear with good BS bilat with no rales, rhonchi, or wheezes  Heart: RRR   Extremities: No pretibial edema.   Neuro: MS is good with appropriate affect, pt is alert and Ox3      DM Foot Exam 01/09/2023 The skin of the feet is intact without sores or ulcerations. The pedal pulses are 2+ on right and 2+ on left. The sensation is intact to a screening 5.07, 10 gram monofilament bilaterally        DATA REVIEWED:  Lab Results  Component Value Date   HGBA1C 6.7 (A) 06/22/2023   HGBA1C 6.9 (A) 01/09/2023   HGBA1C 8.3 (A) 07/04/2022     Latest Reference Range & Units 06/22/23 14:13  Cortisol, Plasma ug/dL 4.4     Latest Reference Range & Units 06/22/23 14:13  VITD 30.00 - 100.00 ng/mL 10.56 (L)  Vitamin B12 211 - 911 pg/mL 602  (L): Data is abnormally low  Latest Reference Range & Units 06/06/23 23:36  Sodium 135 - 145 mmol/L 138  Potassium 3.5 - 5.1  mmol/L 3.0 (L)  Chloride 98 - 111 mmol/L 107  CO2 22 - 32 mmol/L 22  Glucose 70 - 99 mg/dL 604 (H)  BUN 8 - 23 mg/dL 14  Creatinine 5.40 - 9.81 mg/dL 1.91  Calcium 8.9 - 47.8 mg/dL 8.6 (L)  Anion gap 5 - 15  9  GFR, Estimated >60 mL/min >60  (L): Data is abnormally low (H): Data is abnormally high    ASSESSMENT / PLAN / RECOMMENDATIONS:   1) Type 2 Diabetes Mellitus, Optimally controlled, With Neuropathic complications - Most recent A1c of 6.7 % %. Goal A1c < 7.0 %.    -A1c remains at goal -I started her on Ozempic in May 2024, but over the past month she developed blurry vision and itchy eyes, and she has been off of it for a month without any improvement in her symptoms.  I did explain to the patient that I did not believe that her eye symptoms are related to Ozempic, she has an upcoming appointment with ophthalmology, she has a pending colonoscopy and was also told to hold Ozempic 2 weeks prior to that, patient may resume Ozempic after colonoscopy -I will decrease her prandial dose of insulin as below -She was encouraged to continue to use correction scale as needed for hyperglycemia    MEDICATIONS: -Restart Ozempic  0.5 mg weekly -Continue Farxiga  10 mg, 1 tablet with breakfast  -Continue Basaglar 20 units daily  -Decrease Novolog 8 units with meals  -Continue CF : Novolog ( BG -130/30)  EDUCATION / INSTRUCTIONS: BG monitoring instructions: Patient is instructed to check her blood sugars 3 times a day, before meals . Call [facility] if: BG persistently < 70 I reviewed the Rule of 15 for the treatment of hypoglycemia in detail with the patient. Literature supplied.   2) Diabetic complications:  Eye: Does not have known diabetic retinopathy.  Neuro/ Feet: Does have known diabetic peripheral neuropathy  with toe burning 12/2021 Renal: Patient does not have known baseline CKD. She   is on an ACEI/ARB at present.    3) Hypokalemia:  -This is spontaneous  in nature -Will proceed with Aldo, renin, and saliva cortisol testing       4) Feet pain:  -Suspect musculoskeletal -The patient does have a diagnosis of peripheral neuropathy, but these are not typical symptoms -Vitamin B12 is normal , vitamin D is low   5) Vitamin D deficiency   -Start ergocalciferol 50,000 IU weekly  F/U in 6 months    Signed electronically by: Lyndle Herrlich, MD  [facility] End[facility]  [facility] [address] [place] [place], [place] [address] Phone: [phone] FAX: [phone]   CC: Lorenda Ishihara, MD [address] [place] [address] Phone: [phone]  Fax: [phone]  Return to Endocrinology clinic as below: Future Appointments  Date Time Provider Department Center  06/24/2023  3:00 PM Iva Boop, MD [facility]  01/28/2024  1:45 PM Ihor Austin, NP [facility] None

## 2023-06-23 ENCOUNTER — Telehealth: Payer: Self-pay | Admitting: Internal Medicine

## 2023-06-23 MED ORDER — VITAMIN D (ERGOCALCIFEROL) 1.25 MG (50000 UNIT) PO CAPS: 50000.0000 [IU] | ORAL_CAPSULE | ORAL | 2 refills | Status: DC

## 2023-06-23 MED ORDER — ONETOUCH VERIO VI STRP: ORAL_STRIP | 12 refills | Status: AC

## 2023-06-23 NOTE — Telephone Encounter (Signed)
Called and spoke with patient. Went over instructions for how to take her insulin doses, and also went over instructions with new prep times.

## 2023-06-23 NOTE — Telephone Encounter (Signed)
Please let the patient know that her vitamin D is extremely low  A prescription for ergocalciferol 50,000  ONCE  weekly will be sent, please make sure she understands that this is only once a week.   Vitamin B12 is normal, and to continue on multivitamin

## 2023-06-23 NOTE — Telephone Encounter (Signed)
Patient aware and verbalized understanding.

## 2023-06-23 NOTE — Telephone Encounter (Signed)
PT is scheduled for a colonoscopy on tomorrow and wants to know if she can still take insulin as well as Cymbalta and muscle relaxer today and tomorrow. Please advise.

## 2023-06-24 ENCOUNTER — Encounter: Payer: Self-pay | Admitting: Internal Medicine

## 2023-06-24 ENCOUNTER — Ambulatory Visit: Payer: PPO | Admitting: Internal Medicine

## 2023-06-24 VITALS — BP 151/95 | HR 70 | Temp 98.7°F | Resp 12 | Ht 63.0 in | Wt 160.0 lb

## 2023-06-24 DIAGNOSIS — G4733 Obstructive sleep apnea (adult) (pediatric): Secondary | ICD-10-CM | POA: Diagnosis not present

## 2023-06-24 DIAGNOSIS — K5100 Ulcerative (chronic) pancolitis without complications: Secondary | ICD-10-CM | POA: Diagnosis not present

## 2023-06-24 DIAGNOSIS — E119 Type 2 diabetes mellitus without complications: Secondary | ICD-10-CM | POA: Diagnosis not present

## 2023-06-24 DIAGNOSIS — I10 Essential (primary) hypertension: Secondary | ICD-10-CM | POA: Diagnosis not present

## 2023-06-24 DIAGNOSIS — K529 Noninfective gastroenteritis and colitis, unspecified: Secondary | ICD-10-CM | POA: Diagnosis not present

## 2023-06-24 MED ORDER — SODIUM CHLORIDE 0.9 % IV SOLN: 500.0000 mL | Freq: Once | INTRAVENOUS | Status: DC

## 2023-06-24 NOTE — Op Note (Signed)
[facility] Patient Name: Beverly Ramirez Procedure Date: 06/24/2023 9:31 AM MRN: [id number] Endoscopist: Iva Boop , MD, [id number] Age: 62 Referring MD:  Date of Birth: 05-27-1961 Gender: Female Account #: [id number] Procedure:                Colonoscopy Indications:              High risk colon cancer surveillance: Ulcerative                            pancolitis of 8 (or more) years duration, Last                            colonoscopy: 2022 Medicines:                Monitored Anesthesia Care Procedure:                Pre-Anesthesia Assessment:                           - Prior to the procedure, a History and Physical                            was performed, and patient medications and                            allergies were reviewed. The patient's tolerance of                            previous anesthesia was also reviewed. The risks                            and benefits of the procedure and the sedation                            options and risks were discussed with the patient.                            All questions were answered, and informed consent                            was obtained. Prior Anticoagulants: The patient has                            taken no anticoagulant or antiplatelet agents. ASA                            Grade Assessment: II - A patient with mild systemic                            disease. After reviewing the risks and benefits,                            the patient was deemed in satisfactory condition to  undergo the procedure.                           After obtaining informed consent, the colonoscope                            was passed under direct vision. Throughout the                            procedure, the patient's blood pressure, pulse, and                            oxygen saturations were monitored continuously. The                            CF [id number] #[id number] was introduced through  the anus                            and advanced to the the terminal ileum, with                            identification of the appendiceal orifice and IC                            valve. The colonoscopy was performed without                            difficulty. The patient tolerated the procedure                            well. The quality of the bowel preparation was                            good. The terminal ileum, ileocecal valve,                            appendiceal orifice, and rectum were photographed.                            The bowel preparation used was SUPREP via split                            dose instruction. Scope In: 9:44:17 AM Scope Out: 9:58:07 AM Scope Withdrawal Time: 0 hours 11 minutes 0 seconds  Total Procedure Duration: 0 hours 13 minutes 50 seconds  Findings:                 The perianal and digital rectal examinations were                            normal.                           The terminal ileum appeared normal.  The entire examined colon appeared normal on direct                            and retroflexion views.                           Two biopsies were taken every 10 cm with a cold                            forceps from the entire colon for ulcerative                            colitis surveillance. These biopsy specimens were                            sent to Pathology. Verification of patient                            identification for the specimen was done. Estimated                            blood loss was minimal. Complications:            No immediate complications. Estimated Blood Loss:     Estimated blood loss: none. Impression:               - The examined portion of the ileum was normal.                           - The entire examined colon is normal on direct and                            retroflexion views.                           - Biopsies for surveillance were taken from the                             entire colon. Recommendation:           - Patient has a contact number available for                            emergencies. The signs and symptoms of potential                            delayed complications were discussed with the                            patient. Return to normal activities tomorrow.                            Written discharge instructions were provided to the                            patient.                           -  Resume previous diet.                           - Continue present medications.                           - Repeat colonoscopy is recommended for                            surveillance. The colonoscopy date will be                            determined after pathology results from today's                            exam become available for review. Iva Boop, MD 06/24/2023 10:07:32 AM This report has been signed electronically.

## 2023-06-24 NOTE — Progress Notes (Signed)
Report given to PACU, vss 

## 2023-06-24 NOTE — Progress Notes (Signed)
Called to room to assist during endoscopic procedure.  Patient ID and intended procedure confirmed with present staff. Received instructions for my participation in the procedure from the performing physician.  

## 2023-06-24 NOTE — Progress Notes (Signed)
History and Physical Interval Note:  06/24/2023 9:37 AM  Beverly Ramirez  has presented today for endoscopic procedure(s), with the diagnosis of  Encounter Diagnosis  Name Primary?   Ulcerative pancolitis without complication (HCC) Yes  .  The various methods of evaluation and treatment have been discussed with the patient and/or family. After consideration of risks, benefits and other options for treatment, the patient has consented to  the endoscopic procedure(s).   The patient's history has been reviewed, patient examined, no change in status, stable for endoscopic procedure(s).  I have reviewed the patient's chart and labs.  Questions were answered to the patient's satisfaction.     Iva Boop, MD, [facility]

## 2023-06-24 NOTE — Patient Instructions (Addendum)
The colon lining looked normal to me - biopsies taken and I will let you know.  I appreciate the opportunity to care for you. Iva Boop, MD, [facility]   Please read handouts provided. Continue present medications. Await pathology results.  YOU HAD AN ENDOSCOPIC PROCEDURE TODAY AT THE [facility]:   Refer to the procedure report that was given to you for any specific questions about what was found during the examination.  If the procedure report does not answer your questions, please call your gastroenterologist to clarify.  If you requested that your care partner not be given the details of your procedure findings, then the procedure report has been included in a sealed envelope for you to review at your convenience later.  YOU SHOULD EXPECT: Some feelings of bloating in the abdomen. Passage of more gas than usual.  Walking can help get rid of the air that was put into your GI tract during the procedure and reduce the bloating. If you had a lower endoscopy (such as a colonoscopy or flexible sigmoidoscopy) you may notice spotting of blood in your stool or on the toilet paper. If you underwent a bowel prep for your procedure, you may not have a normal bowel movement for a few days.  Please Note:  You might notice some irritation and congestion in your nose or some drainage.  This is from the oxygen used during your procedure.  There is no need for concern and it should clear up in a day or so.  SYMPTOMS TO REPORT IMMEDIATELY:  Following lower endoscopy (colonoscopy or flexible sigmoidoscopy):  Excessive amounts of blood in the stool  Significant tenderness or worsening of abdominal pains  Swelling of the abdomen that is new, acute  Fever of 100F or higher.  For urgent or emergent issues, a gastroenterologist can be reached at any hour by calling [phone]. Do not use MyChart messaging for urgent concerns.    DIET:  We do recommend a small meal at first, but then you may  proceed to your regular diet.  Drink plenty of fluids but you should avoid alcoholic beverages for 24 hours.  ACTIVITY:  You should plan to take it easy for the rest of today and you should NOT DRIVE or use heavy machinery until tomorrow (because of the sedation medicines used during the test).    FOLLOW UP: Our staff will call the number listed on your records the next business day following your procedure.  We will call around 7:15- 8:00 am to check on you and address any questions or concerns that you may have regarding the information given to you following your procedure. If we do not reach you, we will leave a message.     If any biopsies were taken you will be contacted by phone or by letter within the next 1-3 weeks.  Please call us at [phone] if you have not heard about the biopsies in 3 weeks.    SIGNATURES/CONFIDENTIALITY: You and/or your care partner have signed paperwork which will be entered into your electronic medical record.  These signatures attest to the fact that that the information above on your After Visit Summary has been reviewed and is understood.  Full responsibility of the confidentiality of this discharge information lies with you and/or your care-partner.

## 2023-06-24 NOTE — Progress Notes (Signed)
Pt's states no medical or surgical changes since previsit or office visit. 

## 2023-06-25 ENCOUNTER — Telehealth: Payer: Self-pay | Admitting: *Deleted

## 2023-06-25 NOTE — Telephone Encounter (Signed)
  Follow up Call-     06/24/2023    8:56 AM 08/29/2021    1:15 PM  Call back number  Post procedure Call Back phone  # [phone]  Permission to leave phone message Yes Yes     Patient questions:  Do you have a fever, pain , or abdominal swelling? No. Pain Score  0 *  Have you tolerated food without any problems? Yes.    Have you been able to return to your normal activities? Yes.    Do you have any questions about your discharge instructions: Diet   No. Medications  No. Follow up visit  No.  Do you have questions or concerns about your Care? No.  Actions: * If pain score is 4 or above: No action needed, pain <4.

## 2023-06-26 LAB — SURGICAL PATHOLOGY

## 2023-06-29 ENCOUNTER — Encounter: Payer: Self-pay | Admitting: Internal Medicine

## 2023-06-29 LAB — SPECIMEN STATUS REPORT

## 2023-06-30 DIAGNOSIS — E119 Type 2 diabetes mellitus without complications: Secondary | ICD-10-CM | POA: Diagnosis not present

## 2023-06-30 DIAGNOSIS — J4520 Mild intermittent asthma, uncomplicated: Secondary | ICD-10-CM | POA: Diagnosis not present

## 2023-06-30 DIAGNOSIS — L308 Other specified dermatitis: Secondary | ICD-10-CM | POA: Diagnosis not present

## 2023-06-30 DIAGNOSIS — G4733 Obstructive sleep apnea (adult) (pediatric): Secondary | ICD-10-CM | POA: Diagnosis not present

## 2023-06-30 DIAGNOSIS — Z23 Encounter for immunization: Secondary | ICD-10-CM | POA: Diagnosis not present

## 2023-06-30 DIAGNOSIS — Z794 Long term (current) use of insulin: Secondary | ICD-10-CM | POA: Diagnosis not present

## 2023-06-30 DIAGNOSIS — K5190 Ulcerative colitis, unspecified, without complications: Secondary | ICD-10-CM | POA: Diagnosis not present

## 2023-06-30 LAB — ACTH: ACTH: 17 pg/mL (ref 7.2–63.3)

## 2023-06-30 LAB — ALDOSTERONE + RENIN ACTIVITY W/ RATIO: Aldosterone: 3.8 ng/dL (ref 0.0–30.0)

## 2023-07-13 ENCOUNTER — Ambulatory Visit: Payer: PPO | Admitting: Internal Medicine

## 2023-07-13 ENCOUNTER — Other Ambulatory Visit: Payer: Self-pay

## 2023-07-22 ENCOUNTER — Ambulatory Visit (INDEPENDENT_AMBULATORY_CARE_PROVIDER_SITE_OTHER): Payer: PPO | Admitting: Podiatry

## 2023-07-22 DIAGNOSIS — M7752 Other enthesopathy of left foot: Secondary | ICD-10-CM | POA: Diagnosis not present

## 2023-07-22 DIAGNOSIS — G4733 Obstructive sleep apnea (adult) (pediatric): Secondary | ICD-10-CM | POA: Diagnosis not present

## 2023-07-22 NOTE — Progress Notes (Signed)
Subjective:  Patient ID: Beverly Ramirez, female    DOB: 1960/11/07,  MRN: [id number]  Chief Complaint  Patient presents with   Diabetes    BILAT FEET PAIN BEEN HAVING THE PAIN FOR ABOUT A MONTH    62 y.o. female presents with the above complaint.  Patient presents with complaint left second metatarsophalangeal joint pain has been hurting for quite some time she would like to discuss treatment she has not seen Novo-Spirozine is doing 4 months a scale 7 out of 10 dull achy in nature.  She would like to discuss treatment options.   Review of Systems: Negative except as noted in the HPI. Denies N/V/F/Ch.  Past Medical History:  Diagnosis Date   Anemia    Anxiety    Carpal tunnel syndrome    bilateral   Chronic heel pain    Diabetes mellitus without complication (HCC)    DJD (degenerative joint disease) of cervical spine    MRI 2017   Fibromyalgia    Hyperlipidemia    Hypertension    Plantar fasciitis    bilateral   Sickle cell trait (HCC)    Sleep apnea    Tendonitis    left hand/wrist   UC (ulcerative colitis) (HCC)     Current Outpatient Medications:    Accu-Chek Softclix Lancets lancets, CHECK SUGAR 3-4 TIMES DAILY E11.9 *PRIOR AUTH WAS REQUIRED FOR INSURANCE TO PAY*, Disp: 100 each, Rfl: 12   albuterol (VENTOLIN HFA) 108 (90 Base) MCG/ACT inhaler, Inhale 2 puffs into the lungs every 6 (six) hours as needed for wheezing or shortness of breath., Disp: 18 g, Rfl: 3   amLODipine (NORVASC) 5 MG tablet, TAKE 1 TABLET BY MOUTH EVERY DAY, Disp: 90 tablet, Rfl: 0   chlorzoxazone (PARAFON) 500 MG tablet, Take 500 mg by mouth 3 (three) times daily as needed for muscle spasms., Disp: , Rfl:    Continuous Glucose Receiver (FREESTYLE LIBRE 3 READER) DEVI, 1 Device by Does not apply route every 14 (fourteen) days., Disp: 1 each, Rfl: 0   Continuous Glucose Sensor (FREESTYLE LIBRE 3 SENSOR) MISC, 1 Device by Does not apply route every 14 (fourteen) days. Place 1 sensor on the skin every 14  days. Use to check glucose continuously, Disp: 6 each, Rfl: 3   cyclobenzaprine (FLEXERIL) 10 MG tablet, Take 1 tablet by mouth at bedtime., Disp: , Rfl:    dapagliflozin propanediol (FARXIGA) 10 MG TABS tablet, Take 1 tablet (10 mg total) by mouth daily., Disp: 90 tablet, Rfl: 3   DULoxetine (CYMBALTA) 60 MG capsule, TAKE 1 CAPSULE BY MOUTH EVERY DAY, Disp: 90 capsule, Rfl: 1   escitalopram (LEXAPRO) 20 MG tablet, Take 20 mg by mouth daily., Disp: , Rfl:    esomeprazole (NEXIUM) 40 MG capsule, TAKE 1 CAPSULE BY MOUTH EVERY DAY BEFORE BREAKFAST (Patient taking differently: Take 40 mg by mouth daily as needed (indigestion).), Disp: 30 capsule, Rfl: 11   ezetimibe (ZETIA) 10 MG tablet, Take 10 mg by mouth daily., Disp: , Rfl:    famotidine (PEPCID) 20 MG tablet, TK 1 T PO  Q 12 H, Disp: , Rfl:    fluticasone (FLONASE) 50 MCG/ACT nasal spray, SPRAY 2 SPRAYS INTO EACH NOSTRIL EVERY DAY (Patient taking differently: Place 1 spray into both nostrils daily as needed for allergies. Patient takes as needed), Disp: 48 mL, Rfl: 5   glucose blood (ACCU-CHEK AVIVA PLUS) test strip, Check sugar 3-4 times daily  E11.9, Disp: 100 each, Rfl: 12   glucose blood (ONETOUCH  VERIO) test strip, Use to check blood sugar 2x daily, Disp: 100 each, Rfl: 12   hydrOXYzine (ATARAX) 25 MG tablet, Take 25 mg by mouth every 8 (eight) hours as needed., Disp: , Rfl:    insulin aspart (NOVOLOG FLEXPEN) 100 UNIT/ML FlexPen, Max Daily 50 units (Patient taking differently: Inject 10 Units into the skin 3 (three) times daily with meals. Also adds units per order, depending on blood sugar level), Disp: 30 mL, Rfl: 11   Insulin Glargine (BASAGLAR KWIKPEN) 100 UNIT/ML, Inject 20 Units into the skin at bedtime., Disp: 30 mL, Rfl: 1   Insulin Pen Needle 32G X 4 MM MISC, 1 Device by Does not apply route in the morning, at noon, in the evening, and at bedtime., Disp: 400 each, Rfl: 3   Lancets (ONETOUCH DELICA PLUS LANCET33G) MISC, 1 each by Does  not apply route 3 (three) times daily. Dx E11.65, z79.4, Disp: 100 each, Rfl: 11   lisinopril (ZESTRIL) 10 MG tablet, Take 10 mg by mouth daily., Disp: , Rfl:    loratadine (CLARITIN) 10 MG tablet, Take 10 mg by mouth daily., Disp: , Rfl:    meclizine (ANTIVERT) 25 MG tablet, TAKE 1 TABLET BY MOUTH 3 TIMES A DAY AS NEEDED FOR DIZZINESS, Disp: , Rfl:    mercaptopurine (PURINETHOL) 50 MG tablet, TK 2 TS PO QD, Disp: , Rfl:    Mesalamine (ASACOL HD) 800 MG TBEC, TK TWO TS PO Q 8 H, Disp: , Rfl:    mesalamine (LIALDA) 1.2 g EC tablet, Take 2 tablets (2.4 g total) by mouth 2 (two) times daily. (Patient taking differently: Take 4.8 g by mouth daily.), Disp: 360 tablet, Rfl: 3   methocarbamol (ROBAXIN) 500 MG tablet, Take 1 tablet (500 mg total) by mouth 2 (two) times daily., Disp: 24 tablet, Rfl: 0   methocarbamol (ROBAXIN) 750 MG tablet, Take 750 mg by mouth every 6 (six) hours as needed for muscle spasms., Disp: , Rfl:    montelukast (SINGULAIR) 10 MG tablet, TAKE 1 TABLET BY MOUTH EVERYDAY AT BEDTIME, Disp: 90 tablet, Rfl: 0   Multiple Vitamins-Minerals (WOMENS 50+ MULTI VITAMIN/MIN PO), Take 1 tablet by mouth daily., Disp: , Rfl:    ondansetron (ZOFRAN ODT) 4 MG disintegrating tablet, Take 1 tablet (4 mg total) by mouth every 8 (eight) hours as needed for nausea or vomiting., Disp: 20 tablet, Rfl: 2   rosuvastatin (CRESTOR) 40 MG tablet, Take 40 mg by mouth daily., Disp: , Rfl:    Semaglutide,0.25 or 0.5MG /DOS, 2 MG/3ML SOPN, Inject 0.5 mg into the skin once a week., Disp: 9 mL, Rfl: 3   traMADol (ULTRAM) 50 MG tablet, TK 1 T PO  TID, Disp: , Rfl:    triamcinolone cream (KENALOG) 0.1 %, Apply 1 application topically 2 (two) times daily., Disp: 30 g, Rfl: 2   Vitamin D, Ergocalciferol, (DRISDOL) 1.25 MG (50000 UNIT) CAPS capsule, Take 1 capsule (50,000 Units total) by mouth every 7 (seven) days., Disp: 13 capsule, Rfl: 2  Social History   Tobacco Use  Smoking Status Former   Current packs/day:  0.00   Types: Cigarettes   Quit date: 10/13/2006   Years since quitting: 16.7  Smokeless Tobacco Never    Allergies  Allergen Reactions   Sulfa Antibiotics Hives   Sulfamethoxazole-Trimethoprim Hives and Itching   Codeine Itching, Rash, Hives and Other (See Comments)   Latex Hives    With the power   Objective:  There were no vitals filed for this visit.  There is no height or weight on file to calculate BMI. Constitutional Well developed. Well nourished.  Vascular Dorsalis pedis pulses palpable bilaterally. Posterior tibial pulses palpable bilaterally. Capillary refill normal to all digits.  No cyanosis or clubbing noted. Pedal hair growth normal.  Neurologic Normal speech. Oriented to person, place, and time. Epicritic sensation to light touch grossly present bilaterally.  Dermatologic Nails well groomed and normal in appearance. No open wounds. No skin lesions.  Orthopedic: Pain left second submetatarsal.  Pain with range of motion noted pain on palpation submetatarsal 2.  No deep intra-articular pain   Radiographs: None Assessment:   1. Capsulitis of metatarsophalangeal (MTP) joint of left foot    Plan:  Patient was evaluated and treated and all questions answered.  Left second metatarsophalangeal joint capsulitis -All questions and concerns were discussed with the patient in extensive detail -Given the amount of pain that she is having she will benefit from steroid injection help decrease inflammatory component suture pain.  Patient agrees with plan to proceed with steroid injection. A steroid injection was performed at  probably left second metatarsophalangeal joint using 1% plain Lidocaine and 10 mg of Kenalog. This was well tolerated.   No follow-ups on file.

## 2023-08-13 DIAGNOSIS — N3000 Acute cystitis without hematuria: Secondary | ICD-10-CM | POA: Diagnosis not present

## 2023-08-13 DIAGNOSIS — R350 Frequency of micturition: Secondary | ICD-10-CM | POA: Diagnosis not present

## 2023-08-14 ENCOUNTER — Other Ambulatory Visit: Payer: Self-pay | Admitting: Internal Medicine

## 2023-08-14 DIAGNOSIS — Z1231 Encounter for screening mammogram for malignant neoplasm of breast: Secondary | ICD-10-CM

## 2023-08-15 DIAGNOSIS — M5451 Vertebrogenic low back pain: Secondary | ICD-10-CM | POA: Diagnosis not present

## 2023-08-15 DIAGNOSIS — M5412 Radiculopathy, cervical region: Secondary | ICD-10-CM | POA: Diagnosis not present

## 2023-08-15 DIAGNOSIS — M542 Cervicalgia: Secondary | ICD-10-CM | POA: Diagnosis not present

## 2023-08-17 ENCOUNTER — Ambulatory Visit
Admission: RE | Admit: 2023-08-17 | Discharge: 2023-08-17 | Disposition: A | Discharge status: Home / Self Care | Payer: PPO | Source: Ambulatory Visit | Attending: Internal Medicine | Admitting: Internal Medicine

## 2023-08-17 DIAGNOSIS — Z1231 Encounter for screening mammogram for malignant neoplasm of breast: Secondary | ICD-10-CM | POA: Diagnosis not present

## 2023-08-21 DIAGNOSIS — G4733 Obstructive sleep apnea (adult) (pediatric): Secondary | ICD-10-CM | POA: Diagnosis not present

## 2023-08-26 ENCOUNTER — Ambulatory Visit: Payer: PPO | Admitting: Podiatry

## 2023-10-16 ENCOUNTER — Emergency Department (HOSPITAL_COMMUNITY): Payer: PPO

## 2023-10-16 ENCOUNTER — Encounter (HOSPITAL_COMMUNITY): Payer: Self-pay

## 2023-10-16 ENCOUNTER — Emergency Department (HOSPITAL_COMMUNITY)
Admission: EM | Admit: 2023-10-16 | Discharge: 2023-10-16 | Disposition: A | Discharge status: Home / Self Care | Payer: PPO | Attending: Emergency Medicine | Admitting: Emergency Medicine

## 2023-10-16 ENCOUNTER — Other Ambulatory Visit: Payer: Self-pay

## 2023-10-16 DIAGNOSIS — R519 Headache, unspecified: Secondary | ICD-10-CM | POA: Diagnosis present

## 2023-10-16 DIAGNOSIS — E119 Type 2 diabetes mellitus without complications: Secondary | ICD-10-CM | POA: Insufficient documentation

## 2023-10-16 DIAGNOSIS — Z79899 Other long term (current) drug therapy: Secondary | ICD-10-CM | POA: Insufficient documentation

## 2023-10-16 DIAGNOSIS — Z794 Long term (current) use of insulin: Secondary | ICD-10-CM | POA: Insufficient documentation

## 2023-10-16 DIAGNOSIS — I10 Essential (primary) hypertension: Secondary | ICD-10-CM | POA: Diagnosis not present

## 2023-10-16 DIAGNOSIS — Z91040 Latex allergy status: Secondary | ICD-10-CM | POA: Insufficient documentation

## 2023-10-16 LAB — CBC WITH DIFFERENTIAL/PLATELET
Abs Immature Granulocytes: 0.05 10*3/uL (ref 0.00–0.07)
Basophils Absolute: 0.1 10*3/uL (ref 0.0–0.1)
Basophils Relative: 0 %
Eosinophils Absolute: 0.9 10*3/uL — ABNORMAL HIGH (ref 0.0–0.5)
Eosinophils Relative: 7 %
HCT: 39.7 % (ref 36.0–46.0)
Hemoglobin: 12.8 g/dL (ref 12.0–15.0)
Immature Granulocytes: 0 %
Lymphocytes Relative: 35 %
Lymphs Abs: 4.8 10*3/uL — ABNORMAL HIGH (ref 0.7–4.0)
MCH: 27.9 pg (ref 26.0–34.0)
MCHC: 32.2 g/dL (ref 30.0–36.0)
MCV: 86.5 fL (ref 80.0–100.0)
Monocytes Absolute: 1 10*3/uL (ref 0.1–1.0)
Monocytes Relative: 8 %
Neutro Abs: 6.8 10*3/uL (ref 1.7–7.7)
Neutrophils Relative %: 50 %
Platelets: 360 10*3/uL (ref 150–400)
RBC: 4.59 MIL/uL (ref 3.87–5.11)
RDW: 13.4 % (ref 11.5–15.5)
WBC: 13.6 10*3/uL — ABNORMAL HIGH (ref 4.0–10.5)
nRBC: 0 % (ref 0.0–0.2)

## 2023-10-16 LAB — COMPREHENSIVE METABOLIC PANEL
ALT: 33 U/L (ref 0–44)
AST: 35 U/L (ref 15–41)
Albumin: 3.6 g/dL (ref 3.5–5.0)
Alkaline Phosphatase: 101 U/L (ref 38–126)
Anion gap: 10 (ref 5–15)
BUN: 12 mg/dL (ref 8–23)
CO2: 24 mmol/L (ref 22–32)
Calcium: 9.2 mg/dL (ref 8.9–10.3)
Chloride: 104 mmol/L (ref 98–111)
Creatinine, Ser: 0.82 mg/dL (ref 0.44–1.00)
GFR, Estimated: >60 mL/min (ref >60)
Glucose, Bld: 190 mg/dL — ABNORMAL HIGH (ref 70–99)
Potassium: 4 mmol/L (ref 3.5–5.1)
Sodium: 138 mmol/L (ref 135–145)
Total Bilirubin: 0.9 mg/dL (ref 0.0–1.2)
Total Protein: 7.4 g/dL (ref 6.5–8.1)

## 2023-10-16 LAB — CBG MONITORING, ED: Glucose-Capillary: 183 mg/dL — ABNORMAL HIGH (ref 70–99)

## 2023-10-16 MED ORDER — ONDANSETRON HCL 4 MG/2ML IJ SOLN
4.0000 mg | Freq: Once | INTRAMUSCULAR | Status: AC
  Administered 2023-10-16: 4 mg via INTRAVENOUS
  Filled 2023-10-16: qty 2

## 2023-10-16 MED ORDER — ACETAMINOPHEN 500 MG PO TABS
1000.0000 mg | ORAL_TABLET | Freq: Once | ORAL | Status: AC
Start: 2023-10-16 — End: 2023-10-16
  Administered 2023-10-16: 1000 mg via ORAL
  Filled 2023-10-16: qty 2

## 2023-10-16 MED ORDER — KETOROLAC TROMETHAMINE 15 MG/ML IJ SOLN
15.0000 mg | Freq: Once | INTRAMUSCULAR | Status: AC
  Administered 2023-10-16: 15 mg via INTRAVENOUS
  Filled 2023-10-16: qty 1

## 2023-10-16 MED ORDER — MECLIZINE HCL 25 MG PO TABS
12.5000 mg | ORAL_TABLET | Freq: Once | ORAL | Status: AC
  Administered 2023-10-16: 12.5 mg via ORAL
  Filled 2023-10-16: qty 1

## 2023-10-16 NOTE — ED Provider Notes (Signed)
 [place] EMERGENCY DEPARTMENT AT [facility] Provider Note   CSN: [id number] Arrival date & time: 10/16/23  1408     History  Chief Complaint  Patient presents with   Headache   HPI Beverly Ramirez is a 63 y.o. female with history of anemia, diabetes, fibromyalgia, hypertension presenting for headache.  States it started 3 days ago.  Mostly on the left side of her head.  Also states that her left arm has felt achy and weak for the past 2 days as well.  Also endorsing dizziness with associated loss of balance but no room spinning sensation.  Denies chest pain shortness of breath or palpitations.  She states that whenever she is walking she feels like she is going to fall left forward.  States she is never felt this way before.  Denies recent URI symptoms.  Denies nausea, vomiting, and diarrhea.   Headache      Home Medications Prior to Admission medications   Medication Sig Start Date End Date Taking? Authorizing Provider  Accu-Chek Softclix Lancets lancets CHECK SUGAR 3-4 TIMES DAILY E11.9 *PRIOR AUTH WAS REQUIRED FOR INSURANCE TO PAY* 11/02/22   Shamleffer, Donell Cardinal, MD  albuterol  (VENTOLIN  HFA) 108 (90 Base) MCG/ACT inhaler Inhale 2 puffs into the lungs every 6 (six) hours as needed for wheezing or shortness of breath. 03/13/20   Melonie Colonel, Mikel HERO, MD  amLODipine  (NORVASC ) 5 MG tablet TAKE 1 TABLET BY MOUTH EVERY DAY 06/08/20   Melonie Colonel, Mikel HERO, MD  chlorzoxazone (PARAFON) 500 MG tablet Take 500 mg by mouth 3 (three) times daily as needed for muscle spasms. 08/12/21   [provider]  Continuous Glucose Receiver (FREESTYLE LIBRE 3 READER) DEVI 1 Device by Does not apply route every 14 (fourteen) days. 01/09/23   Shamleffer, Ibtehal Jaralla, MD  Continuous Glucose Sensor (FREESTYLE LIBRE 3 SENSOR) MISC 1 Device by Does not apply route every 14 (fourteen) days. Place 1 sensor on the skin every 14 days. Use to check glucose continuously 01/09/23   Shamleffer,  Ibtehal Jaralla, MD  cyclobenzaprine  (FLEXERIL ) 10 MG tablet Take 1 tablet by mouth at bedtime.    [provider]  dapagliflozin  propanediol (FARXIGA ) 10 MG TABS tablet Take 1 tablet (10 mg total) by mouth daily. 06/22/23   Shamleffer, Ibtehal Jaralla, MD  DULoxetine  (CYMBALTA ) 60 MG capsule TAKE 1 CAPSULE BY MOUTH EVERY DAY 03/20/20   Melonie Colonel, Mikel HERO, MD  escitalopram (LEXAPRO) 20 MG tablet Take 20 mg by mouth daily. 05/07/22   [provider]  esomeprazole  (NEXIUM ) 40 MG capsule TAKE 1 CAPSULE BY MOUTH EVERY DAY BEFORE BREAKFAST Patient taking differently: Take 40 mg by mouth daily as needed (indigestion). 07/20/20   Avram Lupita BRAVO, MD  ezetimibe  (ZETIA ) 10 MG tablet Take 10 mg by mouth daily. 05/21/22   [provider]  famotidine (PEPCID) 20 MG tablet TK 1 T PO  Q 12 H    [provider]  fluticasone  (FLONASE ) 50 MCG/ACT nasal spray SPRAY 2 SPRAYS INTO EACH NOSTRIL EVERY DAY Patient taking differently: Place 1 spray into both nostrils daily as needed for allergies. Patient takes as needed 03/08/20   Melonie Colonel, Mikel HERO, MD  glucose blood (ACCU-CHEK AVIVA PLUS) test strip Check sugar 3-4 times daily  E11.9 10/30/22   Shamleffer, Ibtehal Jaralla, MD  glucose blood (ONETOUCH VERIO) test strip Use to check blood sugar 2x daily 06/23/23   Shamleffer, Ibtehal Jaralla, MD  hydrOXYzine  (ATARAX ) 25 MG tablet Take 25  mg by mouth every 8 (eight) hours as needed. 03/17/23   [provider]  insulin  aspart (NOVOLOG  FLEXPEN) 100 UNIT/ML FlexPen Max Daily 50 units Patient taking differently: Inject 10 Units into the skin 3 (three) times daily with meals. Also adds units per order, depending on blood sugar level 05/31/21   Shamleffer, Ibtehal Jaralla, MD  Insulin  Glargine (BASAGLAR  KWIKPEN) 100 UNIT/ML Inject 20 Units into the skin at bedtime. 03/04/23   Shamleffer, Ibtehal Jaralla, MD  Insulin  Pen Needle 32G X 4 MM MISC 1 Device by Does not apply route in the  morning, at noon, in the evening, and at bedtime. 10/30/22   Shamleffer, Ibtehal Jaralla, MD  Lancets [facility] DELICA PLUS [place]) MISC 1 each by Does not apply route 3 (three) times daily. Dx E11.65, z79.4 01/24/19   Melonie Colonel, Mikel HERO, MD  lisinopril (ZESTRIL) 10 MG tablet Take 10 mg by mouth daily. 08/07/21   [provider]  loratadine (CLARITIN) 10 MG tablet Take 10 mg by mouth daily. 05/10/22   [provider]  meclizine  (ANTIVERT ) 25 MG tablet TAKE 1 TABLET BY MOUTH 3 TIMES A DAY AS NEEDED FOR DIZZINESS    [provider]  mercaptopurine (PURINETHOL) 50 MG tablet TK 2 TS PO QD    [provider]  Mesalamine  (ASACOL  HD) 800 MG TBEC TK TWO TS PO Q 8 H    [provider]  mesalamine  (LIALDA ) 1.2 g EC tablet Take 2 tablets (2.4 g total) by mouth 2 (two) times daily. Patient taking differently: Take 4.8 g by mouth daily. 06/05/23 05/30/24  Avram Lupita BRAVO, MD  methocarbamol  (ROBAXIN ) 500 MG tablet Take 1 tablet (500 mg total) by mouth 2 (two) times daily. 08/10/22   Wedderburn, Ngozi N, NP  methocarbamol  (ROBAXIN ) 750 MG tablet Take 750 mg by mouth every 6 (six) hours as needed for muscle spasms.    [provider]  montelukast  (SINGULAIR ) 10 MG tablet TAKE 1 TABLET BY MOUTH EVERYDAY AT BEDTIME 07/08/22   Marinda Rocky SAILOR, MD  Multiple Vitamins-Minerals (WOMENS 50+ MULTI VITAMIN/MIN PO) Take 1 tablet by mouth daily.    [provider]  ondansetron  (ZOFRAN  ODT) 4 MG disintegrating tablet Take 1 tablet (4 mg total) by mouth every 8 (eight) hours as needed for nausea or vomiting. 06/05/23   Avram Lupita BRAVO, MD  rosuvastatin (CRESTOR) 40 MG tablet Take 40 mg by mouth daily.    [provider]  Semaglutide ,0.25 or 0.5MG /DOS, 2 MG/3ML SOPN Inject 0.5 mg into the skin once a week. 01/09/23   Shamleffer, Ibtehal Jaralla, MD  traMADol  (ULTRAM ) 50 MG tablet TK 1 T PO  TID    [provider]  triamcinolone  cream (KENALOG ) 0.1 % Apply  1 application topically 2 (two) times daily. 04/19/20   Melonie Colonel, Mikel HERO, MD  Vitamin D , Ergocalciferol , (DRISDOL ) 1.25 MG (50000 UNIT) CAPS capsule Take 1 capsule (50,000 Units total) by mouth every 7 (seven) days. 06/23/23   Shamleffer, Ibtehal Jaralla, MD      Allergies    Sulfa  antibiotics, Sulfamethoxazole -trimethoprim , Codeine, and Latex    Review of Systems   Review of Systems  Neurological:  Positive for headaches.    Physical Exam Updated Vital Signs BP 119/69 (BP Location: Left Arm)   Pulse 79   Temp 98 F (36.7 C) (Oral)   Resp 18   Ht 5' 2 (1.575 m)   Wt 74.8 kg   SpO2 96%   BMI 30.18 kg/m  Physical  Exam Vitals and nursing note reviewed.  HENT:     Head: Normocephalic and atraumatic.     Mouth/Throat:     Mouth: Mucous membranes are moist.  Eyes:     General:        Right eye: No discharge.        Left eye: No discharge.     Conjunctiva/sclera: Conjunctivae normal.  Cardiovascular:     Rate and Rhythm: Normal rate and regular rhythm.     Pulses: Normal pulses.     Heart sounds: Normal heart sounds.  Pulmonary:     Effort: Pulmonary effort is normal.     Breath sounds: Normal breath sounds.  Abdominal:     General: Abdomen is flat.     Palpations: Abdomen is soft.  Skin:    General: Skin is warm and dry.  Neurological:     General: No focal deficit present.     Comments: GCS 15. Speech is goal oriented. No deficits appreciated to CN III-XII; symmetric eyebrow raise, no facial drooping, tongue midline. Patient has equal grip strength bilaterally with 5/5 strength against resistance in all major muscle groups bilaterally. Sensation to light touch intact. Patient moves extremities without ataxia. Normal finger-nose-finger. Patient ambulatory with steady gait.  Psychiatric:        Mood and Affect: Mood normal.     ED Results / Procedures / Treatments   Labs (all labs ordered are listed, but only abnormal results are displayed) Labs Reviewed  CBC  WITH DIFFERENTIAL/PLATELET - Abnormal; Notable for the following components:      Result Value   WBC 13.6 (*)    Lymphs Abs 4.8 (*)    Eosinophils Absolute 0.9 (*)    All other components within normal limits  COMPREHENSIVE METABOLIC PANEL - Abnormal; Notable for the following components:   Glucose, Bld 190 (*)    All other components within normal limits  CBG MONITORING, ED - Abnormal; Notable for the following components:   Glucose-Capillary 183 (*)    All other components within normal limits    EKG EKG Interpretation Date/Time:  Friday October 16 2023 17:25:03 EST Ventricular Rate:  65 PR Interval:  170 QRS Duration:  70 QT Interval:  410 QTC Calculation: 426 R Axis:   1  Text Interpretation: Normal sinus rhythm Minimal voltage criteria for LVH, may be normal variant ( R in aVL ) ST & T wave abnormality, consider anterolateral ischemia When compared with ECG of 06-Jun-2023 23:31, PREVIOUS ECG IS PRESENT No STEMI Confirmed by Cottie Cough [phone]) on 10/16/2023 5:34:15 PM  Radiology MR Brain Wo Contrast (neuro protocol) Result Date: 10/16/2023 CLINICAL DATA:  Initial evaluation for acute neuro deficit, stroke suspected. EXAM: MRI HEAD WITHOUT CONTRAST TECHNIQUE: Multiplanar, multiecho pulse sequences of the brain and surrounding structures were obtained without intravenous contrast. COMPARISON:  Comparison made with prior CT from 06/07/2023 as well as MRI from 10/04/2020. FINDINGS: Brain: Cerebral volume within normal limits. Patchy T2/FLAIR hyperintensity involving the periventricular and deep white matter both cerebral hemispheres, with patchy involvement of the deep gray nuclei, most likely related chronic microvascular ischemic disease. Changes are mild to moderate in nature, and mildly progressed as compared to prior MRI from 2022. No abnormal foci of restricted diffusion to suggest acute or subacute ischemia. Gray-white matter differentiation maintained. No areas of chronic  cortical infarction. No acute or chronic intracranial blood products. Previously identified subcentimeter right posterior fossa meningioma not well seen on this noncontrast examination, but is grossly similar. No  associated edema or mass effect within this region. No other mass lesion or midline shift. No hydrocephalus or extra-axial fluid collection. Pituitary gland suprasellar region within normal limits. Vascular: Major intracranial vascular flow voids are maintained. Skull and upper cervical spine: Craniocervical junction with normal limits. Bone marrow signal intensity normal. No scalp soft tissue abnormality. Sinuses/Orbits: Globes and orbital soft tissues within normal limits. Mild scattered mucosal thickening present about the frontoethmoidal and maxillary sinuses. No mastoid effusion. Other: None. IMPRESSION: 1. No acute intracranial abnormality. 2. Mild to moderate chronic microvascular ischemic disease, mildly progressed as compared to prior MRI from 2022. 3. Previously identified subcentimeter right posterior fossa meningioma not well seen on this noncontrast examination, but is grossly similar. No associated edema or mass effect within this region. Electronically Signed   By: Morene Hoard M.D.   On: 10/16/2023 19:25    Procedures Procedures    Medications Ordered in ED Medications  meclizine  (ANTIVERT ) tablet 12.5 mg (12.5 mg Oral Given 10/16/23 1722)  ketorolac  (TORADOL ) 15 MG/ML injection 15 mg (15 mg Intravenous Given 10/16/23 1722)  ondansetron  (ZOFRAN ) injection 4 mg (4 mg Intravenous Given 10/16/23 1722)  acetaminophen  (TYLENOL ) tablet 1,000 mg (1,000 mg Oral Given 10/16/23 1722)    ED Course/ Medical Decision Making/ A&P                                 Medical Decision Making Amount and/or Complexity of Data Reviewed Labs: ordered. Radiology: ordered.  Risk OTC drugs. Prescription drug management.   Initial Impression and Ddx 63 year old well-appearing female  presenting for headache and left arm weakness.  Exam was unremarkable.  DDx includes stroke, electrolyte derangement, hypertensive emergency, complicated migraine, meningitis other. Patient PMH that increases complexity of ED encounter:  history of anemia, diabetes, fibromyalgia, hypertension   Interpretation of Diagnostics I independent reviewed and interpreted the labs as followed: leukocytosis, hyperglycemia  - I independently visualized the following imaging with scope of interpretation limited to determining acute life threatening conditions related to emergency care: MRI, which revealed no acute intracranial pathology.  -I personally reviewed and interpreted EKG which revealed normal sinus rhythm and nonischemic  Patient Reassessment and Ultimate Disposition/Management On reassessment patient stated her headache had improved. Doubt stroke given no focal neurodeficits and reassuring MRI. Suspect this is likely a migraine.  Advised continued supportive treatment at home.  Advised to follow-up with her PCP.  Discussed return precautions. Discharged in good condition.  Did discuss MRI findings with patient.  Considered meningitis but unlikely given lack of meningeal symptoms reassuring MRI.  Also consider hypertensive emergency but unlikely given reassuring blood pressure and improvement of her symptoms with headache cocktail.  Patient management required discussion with the following services or consulting groups:  None  Complexity of Problems Addressed Acute complicated illness or Injury  Additional Data Reviewed and Analyzed Further history obtained from: Past medical history and medications listed in the EMR and Prior ED visit notes  Patient Encounter Risk Assessment None         Final Clinical Impression(s) / ED Diagnoses Final diagnoses:  Nonintractable headache, unspecified chronicity pattern, unspecified headache type    Rx / DC Orders ED Discharge Orders     None          Lang Norleen POUR, PA-C 10/16/23 2006    Cottie Donnice PARAS, MD 10/16/23 [phone]

## 2023-10-16 NOTE — Discharge Instructions (Signed)
 Evaluation today was overall reassuring.  Suspect you are having a migraine.  Treatment at home is supportive.  Recommend rest and hydration along with ibuprofen  and you can also take Tylenol .  If you develop any worsening visual disturbance, numbness or weakness in your extremities, facial droop, changes in your speech or any other concerning symptom please return emergency department further evaluation.  Otherwise recommend follow-up with your PCP.

## 2023-10-16 NOTE — ED Triage Notes (Signed)
 Patient has had a headache for 3 days. Feels like both of her eyes burn. Left arm feels achy and weak for 2 days. Has felt off balanced for 2 days.

## 2023-10-20 ENCOUNTER — Telehealth: Payer: Self-pay | Admitting: Neurology

## 2023-10-20 NOTE — Telephone Encounter (Signed)
Pt states her headaches are worse, she was reminded that she has an upcoming appointment with NP. Pt stated she insist on seeing MD so NP appointment was cx.

## 2023-11-05 ENCOUNTER — Ambulatory Visit (INDEPENDENT_AMBULATORY_CARE_PROVIDER_SITE_OTHER): Payer: PPO | Admitting: Neurology

## 2023-11-05 ENCOUNTER — Encounter: Payer: Self-pay | Admitting: Neurology

## 2023-11-05 VITALS — BP 134/87 | HR 77 | Ht 62.5 in | Wt 166.0 lb

## 2023-11-05 DIAGNOSIS — Z789 Other specified health status: Secondary | ICD-10-CM | POA: Diagnosis not present

## 2023-11-05 DIAGNOSIS — G4733 Obstructive sleep apnea (adult) (pediatric): Secondary | ICD-10-CM | POA: Diagnosis not present

## 2023-11-05 DIAGNOSIS — G43909 Migraine, unspecified, not intractable, without status migrainosus: Secondary | ICD-10-CM | POA: Diagnosis not present

## 2023-11-05 DIAGNOSIS — R519 Headache, unspecified: Secondary | ICD-10-CM

## 2023-11-05 NOTE — Patient Instructions (Signed)
 Please restart using your autoPAP regularly. While your insurance requires that you use PAP at least 4 hours each night on 70% of the nights, I recommend, that you not skip any nights and use it throughout the night if you can. Getting used to PAP and staying with the treatment long term does take time and patience and discipline. Untreated obstructive sleep apnea when it is moderate to severe can have an adverse impact on cardiovascular health and raise her risk for heart disease, arrhythmias, hypertension, congestive heart failure, stroke and diabetes. Untreated obstructive sleep apnea causes sleep disruption, nonrestorative sleep, and sleep deprivation. This can have an impact on your day to day functioning and cause daytime sleepiness and impairment of cognitive function, memory loss, mood disturbance, and problems focussing. Using PAP regularly can improve these symptoms.  Your headaches may very well improve after you're consistent with AutoPap therapy.  Please call us back or email [place] through MyChart with the name of your migraine medication that you were given by your PCP.  Good luck with your shoulder surgery.

## 2023-11-05 NOTE — Progress Notes (Signed)
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 or crackles noted.   Heart: S1+S2+0, regular and normal without murmurs, rubs or gallops noted.    Abdomen: Soft, non-tender and non-distended with normal bowel sounds appreciated on auscultation.   Extremities: There is no obvious edema in the distal lower extremities bilaterally.    Skin: Warm and dry without trophic changes noted.    Musculoskeletal: exam reveals limited range motion right shoulder.     Neurologically:  Mental status: The patient is awake, alert and oriented in all 4 spheres. Her immediate and remote memory, attention, language skills and fund of knowledge are appropriate. There is no evidence of aphasia, agnosia, apraxia or anomia. Speech is clear with normal prosody and enunciation. Thought process is linear. Mood is normal and affect is normal.   Cranial nerves II - XII are as described above under HEENT exam.  Motor exam: Normal bulk, strength and tone is noted with limitation in range of motion right shoulder. There is no obvious tremor. Fine motor skills and coordination: grossly intact.  Cerebellar  testing: No dysmetria or intention tremor. There is no truncal or gait ataxia.  Sensory exam: intact to light touch in the upper and lower extremities.  Gait, station and balance: He stands easily. No veering to one side is noted. No leaning to one side is noted. Posture is age-appropriate and stance is narrow based. Gait shows normal stride length and normal pace. No problems turning are noted.      Assessment and Plan:    In summary, Beverly Ramirez is a 63 year old female with an underlying complex medical history of sickle cell anemia, ulcerative colitis, hyperlipidemia, degenerative cervical disc disease, diabetes, carpal tunnel syndrome, bilateral shoulder pain with rotator cuff tear on the right side, chronic low back pain, history of migraine headaches, anemia, and borderline obesity, who presents for follow-up consultation of her recurrent headaches and sleep apnea.  She has had worsening headaches.  She reports that she did better when she was consistent with her AutoPap.  She is planning to get back on it consistently.  She does need new supplies and I would be happy to prescribe a new mask and tubing as well as a water chamber.  She is on multiple medications.  She has chronic neck pain and low back pain.  She has seen orthopedics and pain management.  She is  supposed to have her right shoulder rotator cuff repair next month.  Of note, she had a home sleep test on 10/21/2022 which showed moderate obstructive sleep apnea with an AHI of 17.6/h, O2 nadir 83% with intermittent mild to moderate snoring detected.  She started home AutoPap therapy on 12/10/2022.  She has a ResMed air sense 10 AutoSet machine and her DME company is [employer].  She had a recent brain MRI and CT angiogram of the head and neck.  Eye examination and has new eyeglasses.  She has seen neurosurgery for her meningioma.  This was an extended visit due to copious record review involved and considerable counseling and  coordination of care. At this juncture, she is encouraged to follow up in sleep clinic to see one of our nurse practitioners routinely in 6 to 8 months.  I answered all her questions today and she was in agreement with our plan.  I spent 40 minutes in total face-to-face time and in reviewing records during pre-charting, more than 50% of which was spent in counseling and coordination of care, reviewing test results, reviewing medications and treatment regimen and/or in discussing or reviewing the diagnosis of recurrent headaches, the prognosis and treatment options. Pertinent laboratory and imaging test results that were available during this visit with the patient were reviewed by me and considered in my medical decision making (see chart for details).

## 2023-11-11 ENCOUNTER — Telehealth: Payer: Self-pay

## 2023-11-18 DIAGNOSIS — G43109 Migraine with aura, not intractable, without status migrainosus: Secondary | ICD-10-CM | POA: Diagnosis not present

## 2023-11-18 DIAGNOSIS — F410 Panic disorder [episodic paroxysmal anxiety] without agoraphobia: Secondary | ICD-10-CM | POA: Diagnosis not present

## 2023-11-18 DIAGNOSIS — J301 Allergic rhinitis due to pollen: Secondary | ICD-10-CM | POA: Diagnosis not present

## 2023-11-19 DIAGNOSIS — G4733 Obstructive sleep apnea (adult) (pediatric): Secondary | ICD-10-CM | POA: Diagnosis not present

## 2023-11-23 DIAGNOSIS — U071 COVID-19: Secondary | ICD-10-CM | POA: Diagnosis not present

## 2023-11-23 DIAGNOSIS — J069 Acute upper respiratory infection, unspecified: Secondary | ICD-10-CM | POA: Diagnosis not present

## 2023-12-02 DIAGNOSIS — J40 Bronchitis, not specified as acute or chronic: Secondary | ICD-10-CM | POA: Diagnosis not present

## 2023-12-02 DIAGNOSIS — Z8616 Personal history of COVID-19: Secondary | ICD-10-CM | POA: Diagnosis not present

## 2023-12-20 DIAGNOSIS — G4733 Obstructive sleep apnea (adult) (pediatric): Secondary | ICD-10-CM | POA: Diagnosis not present

## 2023-12-21 ENCOUNTER — Encounter: Payer: Self-pay | Admitting: Internal Medicine

## 2023-12-21 ENCOUNTER — Ambulatory Visit (INDEPENDENT_AMBULATORY_CARE_PROVIDER_SITE_OTHER): Payer: PPO | Admitting: Internal Medicine

## 2023-12-21 VITALS — BP 136/80 | HR 83 | Resp 20 | Ht 62.5 in | Wt 164.4 lb

## 2023-12-21 DIAGNOSIS — E559 Vitamin D deficiency, unspecified: Secondary | ICD-10-CM | POA: Insufficient documentation

## 2023-12-21 DIAGNOSIS — Z794 Long term (current) use of insulin: Secondary | ICD-10-CM | POA: Insufficient documentation

## 2023-12-21 DIAGNOSIS — E1142 Type 2 diabetes mellitus with diabetic polyneuropathy: Secondary | ICD-10-CM | POA: Diagnosis not present

## 2023-12-21 DIAGNOSIS — E1165 Type 2 diabetes mellitus with hyperglycemia: Secondary | ICD-10-CM

## 2023-12-21 LAB — POCT GLYCOSYLATED HEMOGLOBIN (HGB A1C): Hemoglobin A1C: 8 % — AB (ref 4.0–5.6)

## 2023-12-21 MED ORDER — SITAGLIPTIN PHOSPHATE 100 MG PO TABS: 100.0000 mg | ORAL_TABLET | Freq: Every day | ORAL | 3 refills | Status: DC

## 2023-12-21 NOTE — Patient Instructions (Addendum)
-   Start Januvia 100 mg, 1 tablet every day -Continue Farxiga 10 mg daily  -Continue  Basaglar 20  units daily  - Take Novolog 10  units with meals  -Novolog correctional insulin: ADD extra units on insulin to your meal-time Novolog dose if your blood sugars are higher than 160. USE BEFORE YOU EAT   Blood sugar before meal Number of units to inject  Less than 160 0 unit  161 - 190 1 units  191 - 220 2 units  221 -  250 3 units  251 -  280 4 units  281 -  310 5 units    HOW TO TREAT LOW BLOOD SUGARS (Blood sugar LESS THAN 70 MG/DL) Please follow the RULE OF 15 for the treatment of hypoglycemia treatment (when your (blood sugars are less than 70 mg/dL)   STEP 1: Take 15 grams of carbohydrates when your blood sugar is low, which includes:  4 GLUCOSE TABS  OR 4 OZ OF JUICE OR REGULAR SODA OR ONE TUBE OF GLUCOSE GEL    STEP 2: RECHECK blood sugar in 15 MINUTES STEP 3: If your blood sugar is still low at the 15 minute recheck --> then, go back to STEP 1 and treat AGAIN with another 15 grams of carbohydrates

## 2023-12-21 NOTE — Progress Notes (Signed)
 Name: Beverly Ramirez  Age/ Sex: 63 y.o., female   MRN/ DOB: [id number], March 30, 1961     PCP: Arva Lathe, MD   Reason for Endocrinology Evaluation: Type 2 Diabetes Mellitus  Initial Endocrine Consultative Visit: 07/23/2020    PATIENT IDENTIFIER: Beverly Ramirez is a 63 y.o. female with a past medical history of T2DM, OSA, fibromyalgia and UC. The patient has followed with Endocrinology clinic since 07/23/2020 for consultative assistance with management of her diabetes.  DIABETIC HISTORY:  Beverly Ramirez was diagnosed with DM in 2019.Has been on insulin intermittently while on prednisone,  Metformin- too sick. Aaron Aas Her hemoglobin A1c has ranged from 7.1%in 2021 , peaking at 9.5% in 2020   On her initial visit to our clinic she had an A1c of 7.2 % she was on basal insulin and Novolog, We added farxiga   Started Ozempic 01/2023 but she self discontinued due to weight loss that she did not like  Started Januvia/2025   SUBJECTIVE:   During the last visit (06/22/2023): A1c 6.7 %    Today (12/21/2023): Beverly Ramirez is here for a follow up diabetes management.  She checks her blood sugars multiple  times a day through freestyle libre . She has not had had episode of hypoglycemia   She had COVID, requiring glucocorticoids She has migraine headaches and dizziness  Patient follows with GI for ulcerative pancolitis without complications Denies nausea or vomiting  She stopped Ozempic due to weight loss and decrease appetite  Denies constipation or diarrhea    HOME DIABETES REGIMEN:  Farxiga 10 mg, 1 tablet with breakfast  Ozempic 0.5 mg weekly- not taking  Basaglar 20 units daily  Novolog 8 units TIDQAC CF : Novolog ( BG -130/30)     Statin: yes ACE-I/ARB: no    CONTINUOUS GLUCOSE MONITORING RECORD INTERPRETATION    Dates of Recording: 4/1-4/14/2025  Sensor description:freestyle libre  Results statistics:   CGM use % of time 97  Average and SD 178/27.4  Time in  range 56 %  % Time Above 180 35  % Time above 250 9  % Time Below target 0   Glycemic patterns summary: BG's trend down overnight and fluctuate during the day Hyperglycemic episodes  post prandial  Hypoglycemic episodes occurred n/a  Overnight periods: optimal       DIABETIC COMPLICATIONS: Microvascular complications:  Neuropathy Denies: CKD, retinopathy Last Eye Exam: Completed 02/24/2023  Macrovascular complications:   Denies: CAD, CVA, PVD   HISTORY:  Past Medical History:  Past Medical History:  Diagnosis Date   Anemia    Anxiety    Carpal tunnel syndrome    bilateral   Chronic heel pain    Diabetes mellitus without complication (HCC)    DJD (degenerative joint disease) of cervical spine    MRI 2017   Fibromyalgia    Hyperlipidemia    Hypertension    Plantar fasciitis    bilateral   Sickle cell trait (HCC)    Sleep apnea    Tendonitis    left hand/wrist   UC (ulcerative colitis) (HCC)    Past Surgical History:  Past Surgical History:  Procedure Laterality Date   ABDOMINAL HYSTERECTOMY     APPENDECTOMY     CESAREAN SECTION     x4   COLONOSCOPY     Multiple in [place]    ESOPHAGOGASTRODUODENOSCOPY     ROTATOR CUFF REPAIR Left    03/26/2022   Social History:  reports that she quit smoking about 17 years ago.  Her smoking use included cigarettes. She has never used smokeless tobacco. She reports current alcohol use. She reports that she does not use drugs. Family History:  Family History  Problem Relation Age of Onset   Stroke Mother    Diabetes Mother    Hypertension Mother    Lung cancer Mother    Sickle cell trait Mother    Sickle cell trait Father    Sickle cell anemia Sister 60   Sickle cell anemia Sister    Other Sister        accident and blood clot formed   Diabetes Maternal Aunt    Lung cancer Maternal Aunt    Lung cancer Maternal Aunt    Lung cancer Maternal Aunt    Lung cancer Maternal Grandmother    Colon cancer Cousin 30    Pancreatic cancer Neg Hx    Sleep apnea Neg Hx    Headache Neg Hx    Esophageal cancer Neg Hx    Rectal cancer Neg Hx    Stomach cancer Neg Hx      HOME MEDICATIONS: Allergies as of 12/21/2023       Reactions   Sulfa Antibiotics Hives   Sulfamethoxazole-trimethoprim Hives, Itching   Codeine Itching, Rash, Hives, Other (See Comments)   Latex Hives   With the power        Medication List        Accurate as of December 21, 2023  1:39 PM. If you have any questions, ask your nurse or doctor.          Accu-Chek Aviva Plus test strip Generic drug: glucose blood Check sugar 3-4 times daily  E11.9   OneTouch Verio test strip Generic drug: glucose blood Use to check blood sugar 2x daily   albuterol 108 (90 Base) MCG/ACT inhaler Commonly known as: VENTOLIN HFA Inhale 2 puffs into the lungs every 6 (six) hours as needed for wheezing or shortness of breath.   amLODipine 5 MG tablet Commonly known as: NORVASC TAKE 1 TABLET BY MOUTH EVERY DAY   Basaglar KwikPen 100 UNIT/ML Inject 20 Units into the skin at bedtime.   cyclobenzaprine 10 MG tablet Commonly known as: FLEXERIL Take 1 tablet by mouth at bedtime.   dapagliflozin propanediol 10 MG Tabs tablet Commonly known as: Farxiga Take 1 tablet (10 mg total) by mouth daily.   DULoxetine 60 MG capsule Commonly known as: CYMBALTA TAKE 1 CAPSULE BY MOUTH EVERY DAY   escitalopram 20 MG tablet Commonly known as: LEXAPRO Take 20 mg by mouth daily.   esomeprazole 40 MG capsule Commonly known as: NEXIUM TAKE 1 CAPSULE BY MOUTH EVERY DAY BEFORE BREAKFAST What changed: See the new instructions.   ezetimibe 10 MG tablet Commonly known as: ZETIA Take 10 mg by mouth daily.   famotidine 20 MG tablet Commonly known as: PEPCID TK 1 T PO  Q 12 H   fluticasone 50 MCG/ACT nasal spray Commonly known as: FLONASE SPRAY 2 SPRAYS INTO EACH NOSTRIL EVERY DAY What changed: See the new instructions.   FreeStyle Libre 3 Reader  Devi 1 Device by Does not apply route every 14 (fourteen) days.   FreeStyle Libre 3 Sensor Misc 1 Device by Does not apply route every 14 (fourteen) days. Place 1 sensor on the skin every 14 days. Use to check glucose continuously   hydrOXYzine 25 MG tablet Commonly known as: ATARAX Take 25 mg by mouth every 8 (eight) hours as needed.   Insulin Pen Needle 32G X  4 MM Misc 1 Device by Does not apply route in the morning, at noon, in the evening, and at bedtime.   lisinopril 10 MG tablet Commonly known as: ZESTRIL Take 10 mg by mouth daily.   loratadine 10 MG tablet Commonly known as: CLARITIN Take 10 mg by mouth daily.   meclizine 25 MG tablet Commonly known as: ANTIVERT TAKE 1 TABLET BY MOUTH 3 TIMES A DAY AS NEEDED FOR DIZZINESS   mercaptopurine 50 MG tablet Commonly known as: PURINETHOL TK 2 TS PO QD   Asacol HD 800 MG Tbec Generic drug: Mesalamine TK TWO TS PO Q 8 H What changed: Another medication with the same name was changed. Make sure you understand how and when to take each.   mesalamine 1.2 g EC tablet Commonly known as: LIALDA Take 2 tablets (2.4 g total) by mouth 2 (two) times daily. What changed:  how much to take when to take this   methocarbamol 750 MG tablet Commonly known as: ROBAXIN Take 750 mg by mouth every 6 (six) hours as needed for muscle spasms.   montelukast 10 MG tablet Commonly known as: SINGULAIR TAKE 1 TABLET BY MOUTH EVERYDAY AT BEDTIME   NovoLOG FlexPen 100 UNIT/ML FlexPen Generic drug: insulin aspart Max Daily 50 units What changed:  how much to take how to take this when to take this additional instructions   ondansetron 4 MG disintegrating tablet Commonly known as: Zofran ODT Take 1 tablet (4 mg total) by mouth every 8 (eight) hours as needed for nausea or vomiting.   OneTouch Delica Plus Lancet33G Misc 1 each by Does not apply route 3 (three) times daily. Dx E11.65, z79.4   Accu-Chek Softclix Lancets lancets CHECK  SUGAR 3-4 TIMES DAILY E11.9 *PRIOR AUTH WAS REQUIRED FOR INSURANCE TO PAY*   rosuvastatin 40 MG tablet Commonly known as: CRESTOR Take 40 mg by mouth daily.   Semaglutide(0.25 or 0.5MG /DOS) 2 MG/3ML Sopn Inject 0.5 mg into the skin once a week.   traMADol 50 MG tablet Commonly known as: ULTRAM TK 1 T PO  TID   triamcinolone cream 0.1 % Commonly known as: KENALOG Apply 1 application topically 2 (two) times daily.   Vitamin D (Ergocalciferol) 1.25 MG (50000 UNIT) Caps capsule Commonly known as: DRISDOL Take 1 capsule (50,000 Units total) by mouth every 7 (seven) days.   WOMENS 50+ MULTI VITAMIN/MIN PO Take 1 tablet by mouth daily.         OBJECTIVE:   Vital Signs: BP 136/80 (BP Location: Right Arm, Patient Position: Sitting, Cuff Size: Normal)   Pulse 83   Resp 20   Ht 5' 2.5" (1.588 m)   Wt 164 lb 6.4 oz (74.6 kg)   SpO2 99%   BMI 29.59 kg/m   Wt Readings from Last 3 Encounters:  12/21/23 164 lb 6.4 oz (74.6 kg)  11/05/23 166 lb (75.3 kg)  10/16/23 165 lb (74.8 kg)     Exam: General: Pt appears well and is in NAD  Lungs: Clear with good BS bilat   Heart: RRR   Extremities: No pretibial edema.   Neuro: MS is good with appropriate affect, pt is alert and Ox3      DM Foot Exam 12/21/2023 The skin of the feet is intact without sores or ulcerations. The pedal pulses are 2+ on right and 2+ on left. The sensation is intact to a screening 5.07, 10 gram monofilament bilaterally        DATA REVIEWED:  Lab Results  Component Value Date   HGBA1C 8.0 (A) 12/21/2023   HGBA1C 6.7 (A) 06/22/2023   HGBA1C 6.9 (A) 01/09/2023     Latest Reference Range & Units 06/22/23 14:13  Cortisol, Plasma ug/dL 4.4     Latest Reference Range & Units 06/22/23 14:13  VITD 30.00 - 100.00 ng/mL 10.56 (L)  Vitamin B12 211 - 911 pg/mL 602  (L): Data is abnormally low  Latest Reference Range & Units 06/06/23 23:36  Sodium 135 - 145 mmol/L 138  Potassium 3.5 - 5.1 mmol/L 3.0  (L)  Chloride 98 - 111 mmol/L 107  CO2 22 - 32 mmol/L 22  Glucose 70 - 99 mg/dL 951 (H)  BUN 8 - 23 mg/dL 14  Creatinine 8.84 - 1.66 mg/dL 0.63  Calcium 8.9 - 01.6 mg/dL 8.6 (L)  Anion gap 5 - 15  9  GFR, Estimated >60 mL/min >60  (L): Data is abnormally low (H): Data is abnormally high    ASSESSMENT / PLAN / RECOMMENDATIONS:   1) Type 2 Diabetes Mellitus, Poorly controlled, With Neuropathic complications - Most recent A1c of 8.0 % %. Goal A1c < 7.0 %.    -A1c has trended up from 6.7% to 8.0%, this is largely due to discontinuation of Ozempic as well as receiving glucose records for variable reasons -Patient discontinued Ozempic due to weight loss and decreased appetite, she does not want to lose weight -I have recommended Januvia -I will increase her prandial dose of insulin -She was encouraged to continue to use correction scale as needed and to separate NovoLog doses by 4 hours to prevent insulin stacking and hypoglycemia  -I will decrease her prandial dose of insulin as below -She was encouraged to continue to use correction scale as needed for hyperglycemia    MEDICATIONS: -Start Januvia 100 mg daily -Continue Farxiga  10 mg, 1 tablet with breakfast  -Continue Basaglar 20 units daily  -Increase Novolog 10 units with meals  -Continue CF : Novolog ( BG -130/30)  EDUCATION / INSTRUCTIONS: BG monitoring instructions: Patient is instructed to check her blood sugars 3 times a day, before meals . Call [facility] if: BG persistently < 70 I reviewed the Rule of 15 for the treatment of hypoglycemia in detail with the patient. Literature supplied.   2) Diabetic complications:  Eye: Does not have known diabetic retinopathy.  Neuro/ Feet: Does have known diabetic peripheral neuropathy  with toe burning 12/2021 Renal: Patient does not have known baseline CKD. She   is on an ACEI/ARB at present.    3) Vitamin D deficiency  -She continues to take  ergocalciferol  Medication Continue ergocalciferol 50,000 IU weekly  F/U in 6 months    Signed electronically by: Natale Bail, MD  [facility]  [facility] [address] [place] [place], [place] [address] Phone: [phone] FAX: [phone]   CC: Arva Lathe, MD [address] [place] [address] Phone: [phone]  Fax: [phone]  Return to Endocrinology clinic as below: Future Appointments  Date Time Provider Department Center  06/02/2024 11:15 AM Johny Nap, NP [facility] None

## 2024-01-14 ENCOUNTER — Ambulatory Visit: Payer: PPO | Admitting: Neurology

## 2024-01-19 DIAGNOSIS — G4733 Obstructive sleep apnea (adult) (pediatric): Secondary | ICD-10-CM | POA: Diagnosis not present

## 2024-01-22 DIAGNOSIS — J209 Acute bronchitis, unspecified: Secondary | ICD-10-CM | POA: Diagnosis not present

## 2024-01-27 DIAGNOSIS — J029 Acute pharyngitis, unspecified: Secondary | ICD-10-CM | POA: Diagnosis not present

## 2024-01-27 DIAGNOSIS — J0191 Acute recurrent sinusitis, unspecified: Secondary | ICD-10-CM | POA: Diagnosis not present

## 2024-01-27 DIAGNOSIS — E1169 Type 2 diabetes mellitus with other specified complication: Secondary | ICD-10-CM | POA: Diagnosis not present

## 2024-01-27 DIAGNOSIS — J309 Allergic rhinitis, unspecified: Secondary | ICD-10-CM | POA: Diagnosis not present

## 2024-01-28 ENCOUNTER — Ambulatory Visit: Payer: PPO | Admitting: Adult Health

## 2024-02-03 DIAGNOSIS — Z1231 Encounter for screening mammogram for malignant neoplasm of breast: Secondary | ICD-10-CM | POA: Diagnosis not present

## 2024-02-03 DIAGNOSIS — J301 Allergic rhinitis due to pollen: Secondary | ICD-10-CM | POA: Diagnosis not present

## 2024-02-03 DIAGNOSIS — E785 Hyperlipidemia, unspecified: Secondary | ICD-10-CM | POA: Diagnosis not present

## 2024-02-03 DIAGNOSIS — K2100 Gastro-esophageal reflux disease with esophagitis, without bleeding: Secondary | ICD-10-CM | POA: Diagnosis not present

## 2024-02-03 DIAGNOSIS — M797 Fibromyalgia: Secondary | ICD-10-CM | POA: Diagnosis not present

## 2024-02-03 DIAGNOSIS — G4733 Obstructive sleep apnea (adult) (pediatric): Secondary | ICD-10-CM | POA: Diagnosis not present

## 2024-02-03 DIAGNOSIS — F410 Panic disorder [episodic paroxysmal anxiety] without agoraphobia: Secondary | ICD-10-CM | POA: Diagnosis not present

## 2024-02-03 DIAGNOSIS — Z1331 Encounter for screening for depression: Secondary | ICD-10-CM | POA: Diagnosis not present

## 2024-02-03 DIAGNOSIS — E1169 Type 2 diabetes mellitus with other specified complication: Secondary | ICD-10-CM | POA: Diagnosis not present

## 2024-02-03 DIAGNOSIS — G8929 Other chronic pain: Secondary | ICD-10-CM | POA: Diagnosis not present

## 2024-02-03 DIAGNOSIS — Z794 Long term (current) use of insulin: Secondary | ICD-10-CM | POA: Diagnosis not present

## 2024-02-03 DIAGNOSIS — Z0000 Encounter for general adult medical examination without abnormal findings: Secondary | ICD-10-CM | POA: Diagnosis not present

## 2024-02-03 DIAGNOSIS — K5190 Ulcerative colitis, unspecified, without complications: Secondary | ICD-10-CM | POA: Diagnosis not present

## 2024-02-04 DIAGNOSIS — G4733 Obstructive sleep apnea (adult) (pediatric): Secondary | ICD-10-CM | POA: Diagnosis not present

## 2024-03-22 ENCOUNTER — Other Ambulatory Visit: Payer: Self-pay | Admitting: Internal Medicine

## 2024-04-04 DIAGNOSIS — R195 Other fecal abnormalities: Secondary | ICD-10-CM | POA: Diagnosis not present

## 2024-04-04 DIAGNOSIS — R42 Dizziness and giddiness: Secondary | ICD-10-CM | POA: Diagnosis not present

## 2024-04-04 DIAGNOSIS — R110 Nausea: Secondary | ICD-10-CM | POA: Diagnosis not present

## 2024-04-04 DIAGNOSIS — M5459 Other low back pain: Secondary | ICD-10-CM | POA: Diagnosis not present

## 2024-04-04 DIAGNOSIS — R300 Dysuria: Secondary | ICD-10-CM | POA: Diagnosis not present

## 2024-04-18 ENCOUNTER — Ambulatory Visit (INDEPENDENT_AMBULATORY_CARE_PROVIDER_SITE_OTHER): Admitting: Internal Medicine

## 2024-04-18 ENCOUNTER — Encounter: Payer: Self-pay | Admitting: Internal Medicine

## 2024-04-18 VITALS — BP 124/72 | HR 90 | Ht 62.5 in | Wt 163.0 lb

## 2024-04-18 DIAGNOSIS — Z794 Long term (current) use of insulin: Secondary | ICD-10-CM

## 2024-04-18 DIAGNOSIS — E1142 Type 2 diabetes mellitus with diabetic polyneuropathy: Secondary | ICD-10-CM | POA: Diagnosis not present

## 2024-04-18 DIAGNOSIS — E1165 Type 2 diabetes mellitus with hyperglycemia: Secondary | ICD-10-CM | POA: Diagnosis not present

## 2024-04-18 LAB — POCT GLYCOSYLATED HEMOGLOBIN (HGB A1C): Hemoglobin A1C: 7.2 % — AB (ref 4.0–5.6)

## 2024-04-18 LAB — POCT GLUCOSE (DEVICE FOR HOME USE): POC Glucose: 190 mg/dL — AB (ref 70–99)

## 2024-04-18 MED ORDER — FREESTYLE LIBRE 3 PLUS SENSOR MISC: 1.0000 | 3 refills | Status: AC

## 2024-04-18 MED ORDER — BASAGLAR KWIKPEN 100 UNIT/ML ~~LOC~~ SOPN: 22.0000 [IU] | PEN_INJECTOR | Freq: Every day | SUBCUTANEOUS | 4 refills | Status: AC

## 2024-04-18 MED ORDER — NOVOLOG FLEXPEN 100 UNIT/ML ~~LOC~~ SOPN: 10.0000 [IU] | PEN_INJECTOR | Freq: Three times a day (TID) | SUBCUTANEOUS | 3 refills | Status: AC

## 2024-04-18 MED ORDER — DAPAGLIFLOZIN PROPANEDIOL 10 MG PO TABS: 10.0000 mg | ORAL_TABLET | Freq: Every day | ORAL | 3 refills | Status: AC

## 2024-04-18 MED ORDER — INSULIN PEN NEEDLE 32G X 4 MM MISC: 1.0000 | Freq: Four times a day (QID) | 3 refills | Status: AC

## 2024-04-18 MED ORDER — DAPAGLIFLOZIN PROPANEDIOL 10 MG PO TABS: 10.0000 mg | ORAL_TABLET | Freq: Every day | ORAL | 3 refills | Status: DC

## 2024-04-18 MED ORDER — SITAGLIPTIN PHOSPHATE 100 MG PO TABS: 100.0000 mg | ORAL_TABLET | Freq: Every day | ORAL | 3 refills | Status: AC

## 2024-04-18 NOTE — Progress Notes (Signed)
 Name: Beverly Ramirez  Age/ Sex: 63 y.o., female   MRN/ DOB: [id number], 1961-06-25     PCP: Elliot Charm, MD   Reason for Endocrinology Evaluation: Type 2 Diabetes Mellitus  Initial Endocrine Consultative Visit: 07/23/2020    PATIENT IDENTIFIER: [name] is a 63 y.o. female with a past medical history of T2DM, OSA, fibromyalgia and UC. The patient has followed with Endocrinology clinic since 07/23/2020 for consultative assistance with management of her diabetes.  DIABETIC HISTORY:  [name] was diagnosed with DM in 2019.Has been on insulin  intermittently while on prednisone ,  Metformin - too sick. SABRA Her hemoglobin A1c has ranged from 7.1%in 2021 , peaking at 9.5% in 2020   On her initial visit to our clinic she had an A1c of 7.2 % she was on basal insulin  and Novolog , We added farxiga    Started Ozempic  01/2023 but she self discontinued due to weight loss that she did not like  Started Januvia  12/2023   SUBJECTIVE:   During the last visit (12/21/2023): A1c 8.0 %      Today (04/18/2024): [name] is here for a follow up diabetes management.  She checks her blood sugars multiple  times a day through freestyle libre.   Patient follows with GI for ulcerative pancolitis without complications She has been riding a bike every morning  Will also start  silver sneakers   Has chronic nausea but no vomiting  No heartburn   Had constipation a few weeks ago, but this resolved    HOME DIABETES REGIMEN:  Januvia  100 mg daily Farxiga  10 mg, 1 tablet with breakfast   Basaglar  20 units daily  Novolog  10 units TIDQAC CF : Novolog  ( BG -130/30)     Statin: yes ACE-I/ARB: no    CONTINUOUS GLUCOSE MONITORING RECORD INTERPRETATION           DIABETIC COMPLICATIONS: Microvascular complications:  Neuropathy Denies: CKD, retinopathy Last Eye Exam: Completed 02/24/2023  Macrovascular complications:   Denies: CAD, CVA, PVD   HISTORY:  Past Medical  History:  Past Medical History:  Diagnosis Date   Anemia    Anxiety    Carpal tunnel syndrome    bilateral   Chronic heel pain    Diabetes mellitus without complication (HCC)    DJD (degenerative joint disease) of cervical spine    MRI 2017   Fibromyalgia    Hyperlipidemia    Hypertension    Plantar fasciitis    bilateral   Sickle cell trait (HCC)    Sleep apnea    Tendonitis    left hand/wrist   UC (ulcerative colitis) (HCC)    Past Surgical History:  Past Surgical History:  Procedure Laterality Date   ABDOMINAL HYSTERECTOMY     APPENDECTOMY     CESAREAN SECTION     x4   COLONOSCOPY     Multiple in [place]    ESOPHAGOGASTRODUODENOSCOPY     ROTATOR CUFF REPAIR Left    03/26/2022   Social History:  reports that she quit smoking about 17 years ago. Her smoking use included cigarettes. She has never used smokeless tobacco. She reports current alcohol use. She reports that she does not use drugs. Family History:  Family History  Problem Relation Age of Onset   Stroke Mother    Diabetes Mother    Hypertension Mother    Lung cancer Mother    Sickle cell trait Mother    Sickle cell trait Father    Sickle cell anemia Sister 33  Sickle cell anemia Sister    Other Sister        accident and blood clot formed   Diabetes Maternal Aunt    Lung cancer Maternal Aunt    Lung cancer Maternal Aunt    Lung cancer Maternal Aunt    Lung cancer Maternal Grandmother    Colon cancer Cousin 30   Pancreatic cancer Neg Hx    Sleep apnea Neg Hx    Headache Neg Hx    Esophageal cancer Neg Hx    Rectal cancer Neg Hx    Stomach cancer Neg Hx      HOME MEDICATIONS: Allergies as of 04/18/2024       Reactions   Sulfa  Antibiotics Hives   Sulfamethoxazole -trimethoprim  Hives, Itching   Codeine Itching, Rash, Hives, Other (See Comments)   Latex Hives   With the power        Medication List        Accurate as of April 18, 2024  1:47 PM. If you have any questions, ask your  nurse or doctor.          Accu-Chek Aviva Plus test strip Generic drug: glucose blood Check sugar 3-4 times daily  E11.9   OneTouch Verio test strip Generic drug: glucose blood Use to check blood sugar 2x daily   albuterol  108 (90 Base) MCG/ACT inhaler Commonly known as: VENTOLIN  HFA Inhale 2 puffs into the lungs every 6 (six) hours as needed for wheezing or shortness of breath.   amLODipine  5 MG tablet Commonly known as: NORVASC  TAKE 1 TABLET BY MOUTH EVERY DAY   Basaglar  KwikPen 100 UNIT/ML Inject 20 Units into the skin at bedtime.   cyclobenzaprine  10 MG tablet Commonly known as: FLEXERIL  Take 1 tablet by mouth at bedtime.   dapagliflozin  propanediol 10 MG Tabs tablet Commonly known as: Farxiga  Take 1 tablet (10 mg total) by mouth daily.   DULoxetine  60 MG capsule Commonly known as: CYMBALTA  TAKE 1 CAPSULE BY MOUTH EVERY DAY   escitalopram 20 MG tablet Commonly known as: LEXAPRO Take 20 mg by mouth daily.   esomeprazole  40 MG capsule Commonly known as: NEXIUM  TAKE 1 CAPSULE BY MOUTH EVERY DAY BEFORE BREAKFAST What changed: See the new instructions.   ezetimibe  10 MG tablet Commonly known as: ZETIA  Take 10 mg by mouth daily.   famotidine 20 MG tablet Commonly known as: PEPCID TK 1 T PO  Q 12 H   fluticasone  50 MCG/ACT nasal spray Commonly known as: FLONASE  SPRAY 2 SPRAYS INTO EACH NOSTRIL EVERY DAY What changed: See the new instructions.   FreeStyle Libre 3 Reader Devi 1 Device by Does not apply route every 14 (fourteen) days.   FreeStyle Libre 3 Sensor Misc 1 DEVICE BY DOES NOT APPLY ROUTE EVERY 14 (FOURTEEN) DAYS. PLACE 1 SENSOR ON THE SKIN EVERY 14 DAYS. USE TO CHECK GLUCOSE CONTINUOUSLY   hydrOXYzine  25 MG tablet Commonly known as: ATARAX  Take 25 mg by mouth every 8 (eight) hours as needed.   Insulin  Pen Needle 32G X 4 MM Misc 1 Device by Does not apply route in the morning, at noon, in the evening, and at bedtime.   lisinopril 10 MG  tablet Commonly known as: ZESTRIL Take 10 mg by mouth daily.   loratadine 10 MG tablet Commonly known as: CLARITIN Take 10 mg by mouth daily.   meclizine  25 MG tablet Commonly known as: ANTIVERT  TAKE 1 TABLET BY MOUTH 3 TIMES A DAY AS NEEDED FOR DIZZINESS   mercaptopurine  50 MG tablet Commonly known as: PURINETHOL TK 2 TS PO QD   Asacol  HD 800 MG Tbec Generic drug: Mesalamine  TK TWO TS PO Q 8 H What changed: Another medication with the same name was changed. Make sure you understand how and when to take each.   mesalamine  1.2 g EC tablet Commonly known as: LIALDA  Take 2 tablets (2.4 g total) by mouth 2 (two) times daily. What changed:  how much to take when to take this   methocarbamol  750 MG tablet Commonly known as: ROBAXIN  Take 750 mg by mouth every 6 (six) hours as needed for muscle spasms.   montelukast  10 MG tablet Commonly known as: SINGULAIR  TAKE 1 TABLET BY MOUTH EVERYDAY AT BEDTIME   NovoLOG  FlexPen 100 UNIT/ML FlexPen Generic drug: insulin  aspart Max Daily 50 units What changed:  how much to take how to take this when to take this additional instructions   ondansetron  4 MG disintegrating tablet Commonly known as: Zofran  ODT Take 1 tablet (4 mg total) by mouth every 8 (eight) hours as needed for nausea or vomiting.   OneTouch Delica Plus Lancet33G Misc 1 each by Does not apply route 3 (three) times daily. Dx E11.65, z79.4   Accu-Chek Softclix Lancets lancets CHECK SUGAR 3-4 TIMES DAILY E11.9 *PRIOR AUTH WAS REQUIRED FOR INSURANCE TO PAY*   rosuvastatin 40 MG tablet Commonly known as: CRESTOR Take 40 mg by mouth daily.   sitaGLIPtin  100 MG tablet Commonly known as: Januvia  Take 1 tablet (100 mg total) by mouth daily.   traMADol  50 MG tablet Commonly known as: ULTRAM  TK 1 T PO  TID   triamcinolone  cream 0.1 % Commonly known as: KENALOG  Apply 1 application topically 2 (two) times daily.   Vitamin D  (Ergocalciferol ) 1.25 MG (50000 UNIT)  Caps capsule Commonly known as: DRISDOL  Take 1 capsule (50,000 Units total) by mouth every 7 (seven) days.   WOMENS 50+ MULTI VITAMIN/MIN PO Take 1 tablet by mouth daily.         OBJECTIVE:   Vital Signs: BP 124/72 (BP Location: Left Arm, Patient Position: Sitting, Cuff Size: Normal)   Pulse 90   Ht 5' 2.5 (1.588 m)   Wt 163 lb (73.9 kg)   SpO2 97%   BMI 29.34 kg/m   Wt Readings from Last 3 Encounters:  04/18/24 163 lb (73.9 kg)  12/21/23 164 lb 6.4 oz (74.6 kg)  11/05/23 166 lb (75.3 kg)     Exam: General: Pt appears well and is in NAD  Lungs: Clear with good BS bilat   Heart: RRR   Extremities: No pretibial edema.   Neuro: MS is good with appropriate affect, pt is alert and Ox3    DM Foot Exam 12/21/2023 The skin of the feet is intact without sores or ulcerations. The pedal pulses are 2+ on right and 2+ on left. The sensation is intact to a screening 5.07, 10 gram monofilament bilaterally        DATA REVIEWED:  Lab Results  Component Value Date   HGBA1C 7.2 (A) 04/18/2024   HGBA1C 8.0 (A) 12/21/2023   HGBA1C 6.7 (A) 06/22/2023     Latest Reference Range & Units 06/22/23 14:13  Cortisol, Plasma ug/dL 4.4     Latest Reference Range & Units 06/22/23 14:13  VITD 30.00 - 100.00 ng/mL 10.56 (L)  Vitamin B12 211 - 911 pg/mL 602      Latest Reference Range & Units 06/06/23 23:36  Sodium 135 - 145 mmol/L 138  Potassium 3.5 - 5.1 mmol/L 3.0 (L)  Chloride 98 - 111 mmol/L 107  CO2 22 - 32 mmol/L 22  Glucose 70 - 99 mg/dL 863 (H)  BUN 8 - 23 mg/dL 14  Creatinine 9.55 - 8.99 mg/dL 9.32  Calcium  8.9 - 10.3 mg/dL 8.6 (L)  Anion gap 5 - 15  9  GFR, Estimated >60 mL/min >60    In office BG 190 mg/dL   ASSESSMENT / PLAN / RECOMMENDATIONS:   1) Type 2 Diabetes Mellitus, Sub-Optimally controlled, With Neuropathic complications - Most recent A1c of 7.2% %. Goal A1c < 7.0 %.    -A1c has trended down -Patient discontinued Ozempic  due to weight loss and  decreased appetite, she does not want to lose weight -I will increase her basal insulin   as her fasting BG's > 130 ( memory recall) - A new prescription for freestyle libre 3+ was sent to the pharmacy - The patient tends to forget NovoLog  before breakfast, so she takes it first thing in the morning or sometimes ends up being an hour before she eats, I did discourage the patient from this practice due to the high risk of hypoglycemia  MEDICATIONS: -Continue Januvia  100 mg daily -Continue Farxiga   10 mg, 1 tablet with breakfast  -Increase Basaglar  22 units daily  -Continue Novolog  10 units with meals  -Continue CF : Novolog  ( BG -130/30)  EDUCATION / INSTRUCTIONS: BG monitoring instructions: Patient is instructed to check her blood sugars 3 times a day, before meals . Call [facility] [place] [facility] if: BG persistently < 70 I reviewed the Rule of 15 for the treatment of hypoglycemia in detail with the patient. Literature supplied.   2) Diabetic complications:  Eye: Does not have known diabetic retinopathy.  Neuro/ Feet: Does have known diabetic peripheral neuropathy  with toe burning 12/2021 Renal: Patient does not have known baseline CKD. She   is on an ACEI/ARB at present.    F/U in 6 months    Signed electronically by: Stefano Redgie Butts, MD  [facility]  [facility] [address] [place] [place], [place] [address] Phone: [phone] FAX: [phone]   CC: Elliot Charm, MD [address] [place] [address] Phone: [phone]  Fax: [phone]  Return to Endocrinology clinic as below: Future Appointments  Date Time Provider Department Center  06/02/2024 11:15 AM Whitfield Raisin, NP [facility] None

## 2024-04-18 NOTE — Patient Instructions (Signed)
-   Continue Januvia  100 mg, 1 tablet every day - Continue Farxiga  10 mg daily  - Increase  Basaglar  22  units daily  - Take Novolog  10  units with meals  -Novolog  correctional insulin : ADD extra units on insulin  to your meal-time Novolog  dose if your blood sugars are higher than 160. USE BEFORE YOU EAT   Blood sugar before meal Number of units to inject  Less than 160 0 unit  161 - 190 1 units  191 - 220 2 units  221 -  250 3 units  251 -  280 4 units  281 -  310 5 units    HOW TO TREAT LOW BLOOD SUGARS (Blood sugar LESS THAN 70 MG/DL) Please follow the RULE OF 15 for the treatment of hypoglycemia treatment (when your (blood sugars are less than 70 mg/dL)   STEP 1: Take 15 grams of carbohydrates when your blood sugar is low, which includes:  4 GLUCOSE TABS  OR 4 OZ OF JUICE OR REGULAR SODA OR ONE TUBE OF GLUCOSE GEL    STEP 2: RECHECK blood sugar in 15 MINUTES STEP 3: If your blood sugar is still low at the 15 minute recheck --> then, go back to STEP 1 and treat AGAIN with another 15 grams of carbohydrates

## 2024-05-23 DIAGNOSIS — M5459 Other low back pain: Secondary | ICD-10-CM | POA: Diagnosis not present

## 2024-05-23 DIAGNOSIS — I10 Essential (primary) hypertension: Secondary | ICD-10-CM | POA: Diagnosis not present

## 2024-05-24 DIAGNOSIS — M5459 Other low back pain: Secondary | ICD-10-CM | POA: Diagnosis not present

## 2024-05-24 DIAGNOSIS — M47816 Spondylosis without myelopathy or radiculopathy, lumbar region: Secondary | ICD-10-CM | POA: Diagnosis not present

## 2024-06-01 NOTE — Progress Notes (Unsigned)
 [facility] [address] [place]. [place] [address] [phone]       OFFICE FOLLOW UP NOTE  Ms. Sadonna Kotara Date of Birth:  [birth year] Medical Record Number:  [id number]    Primary neurologist: Dr. Buck Reason for visit: Chronic headaches, OSA    SUBJECTIVE:  CHIEF COMPLAINT:  No chief complaint on file.   Follow-up visit:  Prior visit: 11/05/2023 with Dr. Buck  Brief HPI:   Beverly Ramirez is a 63 y.o. female with PMH of sickle cell anemia, ulcerative colitis, degenerative cervical disc disease, DM, HLD, and cerebral meningioma who is followed for headache and OSA management.  Previously diagnosed with OSA in 2021 but never started AutoPap.  Repeat home sleep test 10/2022 showed moderate OSA and proceeded with AutoPap therapy.  Noted chronic neck pain with continued follow-up with neurosurgery.  At prior visit with Dr. Buck, noted noncompliance with CPAP therapy and complained of worsening headaches over the past several months.  She was seen in the ED 10/2023 for headache and imbalance.  MRI brain no acute abnormality, stable right posterior fossa meningioma.  Discussed importance of CPAP compliance which previously improved headaches and to continue to follow with orthopedics and pain management for chronic neck and low back pain.   Interval history:      ROS:   14 system review of systems performed and negative with exception of ***  PMH:  Past Medical History:  Diagnosis Date   Anemia    Anxiety    Carpal tunnel syndrome    bilateral   Chronic heel pain    Diabetes mellitus without complication (HCC)    DJD (degenerative joint disease) of cervical spine    MRI 2017   Fibromyalgia    Hyperlipidemia    Hypertension    Plantar fasciitis    bilateral   Sickle cell trait    Sleep apnea    Tendonitis    left hand/wrist   UC (ulcerative colitis) (HCC)     PSH:  Past Surgical History:  Procedure Laterality Date   ABDOMINAL  HYSTERECTOMY     APPENDECTOMY     CESAREAN SECTION     x4   COLONOSCOPY     Multiple in [place]    ESOPHAGOGASTRODUODENOSCOPY     ROTATOR CUFF REPAIR Left    03/26/2022    Social History:  Social History   Socioeconomic History   Marital status: Married    Spouse name: Marinda   Number of children: 5   Years of education: Not on file   Highest education level: Not on file  Occupational History   Occupation: retired    Comment: He formerly worked for [employer], disabled due to a fall  Tobacco Use   Smoking status: Former    Current packs/day: 0.00    Types: Cigarettes    Quit date: 10/13/2006    Years since quitting: 17.6   Smokeless tobacco: Never  Vaping Use   Vaping status: Never Used  Substance and Sexual Activity   Alcohol use: Yes    Comment: occasionally   Drug use: No   Sexual activity: Yes    Partners: Male    Birth control/protection: Surgical    Comment: 1ST intercourse- 17, partners- lifetime 5, married- 16 yrs  Other Topics Concern   Not on file  Social History Narrative   She is married, 3 sons born 26, 42, 23.  One daughter born in 15.   She is medically  retired from the [employer] after a fall and a hip injury, around 2004.   Caffeine: tea and coffee once in awhile          Social Drivers of Health   Financial Resource Strain: Not on file  Food Insecurity: Not on file  Transportation Needs: Not on file  Physical Activity: Not on file  Stress: Not on file  Social Connections: Not on file  Intimate Partner Violence: Not on file    Family History:  Family History  Problem Relation Age of Onset   Stroke Mother    Diabetes Mother    Hypertension Mother    Lung cancer Mother    Sickle cell trait Mother    Sickle cell trait Father    Sickle cell anemia Sister 51   Sickle cell anemia Sister    Other Sister        accident and blood clot formed   Diabetes Maternal Aunt    Lung cancer  Maternal Aunt    Lung cancer Maternal Aunt    Lung cancer Maternal Aunt    Lung cancer Maternal Grandmother    Colon cancer Cousin 30   Pancreatic cancer Neg Hx    Sleep apnea Neg Hx    Headache Neg Hx    Esophageal cancer Neg Hx    Rectal cancer Neg Hx    Stomach cancer Neg Hx     Medications:   Current Outpatient Medications on File Prior to Visit  Medication Sig Dispense Refill   Accu-Chek Softclix Lancets lancets CHECK SUGAR 3-4 TIMES DAILY E11.9 *PRIOR AUTH WAS REQUIRED FOR INSURANCE TO PAY* 100 each 12   albuterol  (VENTOLIN  HFA) 108 (90 Base) MCG/ACT inhaler Inhale 2 puffs into the lungs every 6 (six) hours as needed for wheezing or shortness of breath. 18 g 3   amLODipine  (NORVASC ) 5 MG tablet TAKE 1 TABLET BY MOUTH EVERY DAY 90 tablet 0   Continuous Glucose Receiver (FREESTYLE LIBRE 3 READER) DEVI 1 Device by Does not apply route every 14 (fourteen) days. 1 each 0   Continuous Glucose Sensor (FREESTYLE LIBRE 3 PLUS SENSOR) MISC 1 Device by Other route every 14 (fourteen) days. Change sensor every 15 days. 6 each 3   cyclobenzaprine  (FLEXERIL ) 10 MG tablet Take 1 tablet by mouth at bedtime.     dapagliflozin  propanediol (FARXIGA ) 10 MG TABS tablet Take 1 tablet (10 mg total) by mouth daily. 90 tablet 3   DULoxetine  (CYMBALTA ) 60 MG capsule TAKE 1 CAPSULE BY MOUTH EVERY DAY 90 capsule 1   escitalopram (LEXAPRO) 20 MG tablet Take 20 mg by mouth daily.     esomeprazole  (NEXIUM ) 40 MG capsule TAKE 1 CAPSULE BY MOUTH EVERY DAY BEFORE BREAKFAST (Patient taking differently: Take 40 mg by mouth daily as needed (indigestion).) 30 capsule 11   ezetimibe  (ZETIA ) 10 MG tablet Take 10 mg by mouth daily.     famotidine (PEPCID) 20 MG tablet TK 1 T PO  Q 12 H     fluticasone  (FLONASE ) 50 MCG/ACT nasal spray SPRAY 2 SPRAYS INTO EACH NOSTRIL EVERY DAY (Patient taking differently: Place 1 spray into both nostrils daily as needed for allergies. Patient takes as needed) 48 mL 5   glucose blood  (ACCU-CHEK AVIVA PLUS) test strip Check sugar 3-4 times daily  E11.9 100 each 12   glucose blood (ONETOUCH VERIO) test strip Use to check blood sugar 2x daily 100 each 12   hydrOXYzine  (ATARAX ) 25 MG tablet  Take 25 mg by mouth every 8 (eight) hours as needed.     insulin  aspart (NOVOLOG  FLEXPEN) 100 UNIT/ML FlexPen Inject 10-20 Units into the skin 3 (three) times daily with meals. Max Daily 50 units 60 mL 3   Insulin  Glargine (BASAGLAR  KWIKPEN) 100 UNIT/ML Inject 22 Units into the skin at bedtime. 30 mL 4   Insulin  Pen Needle 32G X 4 MM MISC 1 Device by Does not apply route in the morning, at noon, in the evening, and at bedtime. 400 each 3   Lancets (ONETOUCH DELICA PLUS LANCET33G) MISC 1 each by Does not apply route 3 (three) times daily. Dx E11.65, z79.4 100 each 11   lisinopril (ZESTRIL) 10 MG tablet Take 10 mg by mouth daily.     loratadine (CLARITIN) 10 MG tablet Take 10 mg by mouth daily.     meclizine  (ANTIVERT ) 25 MG tablet TAKE 1 TABLET BY MOUTH 3 TIMES A DAY AS NEEDED FOR DIZZINESS     mercaptopurine (PURINETHOL) 50 MG tablet TK 2 TS PO QD     Mesalamine  (ASACOL  HD) 800 MG TBEC TK TWO TS PO Q 8 H     mesalamine  (LIALDA ) 1.2 g EC tablet Take 2 tablets (2.4 g total) by mouth 2 (two) times daily. (Patient taking differently: Take 4.8 g by mouth daily.) 360 tablet 3   methocarbamol  (ROBAXIN ) 750 MG tablet Take 750 mg by mouth every 6 (six) hours as needed for muscle spasms.     montelukast  (SINGULAIR ) 10 MG tablet TAKE 1 TABLET BY MOUTH EVERYDAY AT BEDTIME 90 tablet 0   Multiple Vitamins-Minerals (WOMENS 50+ MULTI VITAMIN/MIN PO) Take 1 tablet by mouth daily.     ondansetron  (ZOFRAN  ODT) 4 MG disintegrating tablet Take 1 tablet (4 mg total) by mouth every 8 (eight) hours as needed for nausea or vomiting. 20 tablet 2   rosuvastatin (CRESTOR) 40 MG tablet Take 40 mg by mouth daily.     sitaGLIPtin  (JANUVIA ) 100 MG tablet Take 1 tablet (100 mg total) by mouth daily. 90 tablet 3   traMADol   (ULTRAM ) 50 MG tablet TK 1 T PO  TID     triamcinolone  cream (KENALOG ) 0.1 % Apply 1 application topically 2 (two) times daily. 30 g 2   Vitamin D , Ergocalciferol , (DRISDOL ) 1.25 MG (50000 UNIT) CAPS capsule Take 1 capsule (50,000 Units total) by mouth every 7 (seven) days. 13 capsule 2   No current facility-administered medications on file prior to visit.    Allergies:   Allergies  Allergen Reactions   Sulfa  Antibiotics Hives   Sulfamethoxazole -Trimethoprim  Hives and Itching   Codeine Itching, Rash, Hives and Other (See Comments)   Latex Hives    With the power      OBJECTIVE:  Physical Exam  There were no vitals filed for this visit. There is no height or weight on file to calculate BMI. No results found.   General: well developed, well nourished, seated, in no evident distress Head: head normocephalic and atraumatic.   Neck: supple with no carotid or supraclavicular bruits Cardiovascular: regular rate and rhythm, no murmurs Musculoskeletal: no deformity Skin:  no rash/petichiae Vascular:  Normal pulses all extremities   Neurologic Exam Mental Status: Awake and fully alert. Oriented to place and time. Recent and remote memory intact. Attention span, concentration and fund of knowledge appropriate. Mood and affect appropriate.  Cranial Nerves: Pupils equal, briskly reactive to light. Extraocular movements full without nystagmus. Visual fields full to confrontation. Hearing intact. Facial sensation  intact. Face, tongue, palate moves normally and symmetrically.  Motor: Normal bulk and tone. Normal strength in all tested extremity muscles Sensory.: intact to touch , pinprick , position and vibratory sensation.  Coordination: Rapid alternating movements normal in all extremities. Finger-to-nose and heel-to-shin performed accurately bilaterally. Gait and Station: Arises from chair without difficulty. Stance is normal. Gait demonstrates normal stride length and balance with ***.  Tandem walk and heel toe ***.  Reflexes: 1+ and symmetric. Toes downgoing.         ASSESSMENT/PLAN: Taira Knabe is a 63 y.o. year old female      *** :       Follow up in *** or call earlier if needed   CC:  PCP: Elliot Charm, MD    I personally spent a total of *** minutes in the care of the patient today including {Time Based Coding:[id number]}.     Harlene Bogaert, AGNP-BC  [facility] [address] [place], [place] [address]  Phone [phone] Fax [phone] Note: This document was prepared with digital dictation and possible smart phrase technology. Any transcriptional errors that result from this process are unintentional.

## 2024-06-02 ENCOUNTER — Ambulatory Visit: Payer: PPO | Admitting: Adult Health

## 2024-06-02 ENCOUNTER — Encounter: Payer: Self-pay | Admitting: Adult Health

## 2024-06-02 VITALS — BP 145/80 | HR 65 | Ht 62.0 in | Wt 162.0 lb

## 2024-06-02 DIAGNOSIS — D320 Benign neoplasm of cerebral meninges: Secondary | ICD-10-CM | POA: Diagnosis not present

## 2024-06-02 DIAGNOSIS — G4733 Obstructive sleep apnea (adult) (pediatric): Secondary | ICD-10-CM | POA: Diagnosis not present

## 2024-06-02 DIAGNOSIS — R519 Headache, unspecified: Secondary | ICD-10-CM | POA: Diagnosis not present

## 2024-06-02 NOTE — Patient Instructions (Addendum)
 Your Plan:  New order will be placed to [facility] to obtain updated supplies - please ensure you are using CPAP nightly for adequate sleep apnea management as well as likely improvement of migraine headaches  Continue to follow with your PCP for management of migraines  We will plan on doing repeat imaging for monitoring of your cerebral meningioma towards the end of next year     Follow up in 1 year or call earlier if needed      Thank you for coming to see us  at [facility]. I hope we have been able to provide you high quality care today.  You may receive a patient satisfaction survey over the next few weeks. We would appreciate your feedback and comments so that we may continue to improve ourselves and the health of our patients.

## 2024-06-13 DIAGNOSIS — M25551 Pain in right hip: Secondary | ICD-10-CM | POA: Diagnosis not present

## 2024-06-13 DIAGNOSIS — M5459 Other low back pain: Secondary | ICD-10-CM | POA: Diagnosis not present

## 2024-06-13 DIAGNOSIS — M25552 Pain in left hip: Secondary | ICD-10-CM | POA: Diagnosis not present

## 2024-06-16 ENCOUNTER — Other Ambulatory Visit: Payer: Self-pay | Admitting: Internal Medicine

## 2024-06-19 ENCOUNTER — Other Ambulatory Visit: Payer: Self-pay | Admitting: Internal Medicine

## 2024-06-20 DIAGNOSIS — M5459 Other low back pain: Secondary | ICD-10-CM | POA: Diagnosis not present

## 2024-06-20 DIAGNOSIS — M25552 Pain in left hip: Secondary | ICD-10-CM | POA: Diagnosis not present

## 2024-06-20 DIAGNOSIS — M25551 Pain in right hip: Secondary | ICD-10-CM | POA: Diagnosis not present

## 2024-06-23 DIAGNOSIS — M5459 Other low back pain: Secondary | ICD-10-CM | POA: Diagnosis not present

## 2024-06-23 DIAGNOSIS — M25551 Pain in right hip: Secondary | ICD-10-CM | POA: Diagnosis not present

## 2024-06-23 DIAGNOSIS — M25552 Pain in left hip: Secondary | ICD-10-CM | POA: Diagnosis not present

## 2024-06-27 DIAGNOSIS — M5459 Other low back pain: Secondary | ICD-10-CM | POA: Diagnosis not present

## 2024-06-27 DIAGNOSIS — M25552 Pain in left hip: Secondary | ICD-10-CM | POA: Diagnosis not present

## 2024-06-27 DIAGNOSIS — M25551 Pain in right hip: Secondary | ICD-10-CM | POA: Diagnosis not present

## 2024-06-30 DIAGNOSIS — M25552 Pain in left hip: Secondary | ICD-10-CM | POA: Diagnosis not present

## 2024-06-30 DIAGNOSIS — M25551 Pain in right hip: Secondary | ICD-10-CM | POA: Diagnosis not present

## 2024-06-30 DIAGNOSIS — M5459 Other low back pain: Secondary | ICD-10-CM | POA: Diagnosis not present

## 2024-07-04 DIAGNOSIS — M25551 Pain in right hip: Secondary | ICD-10-CM | POA: Diagnosis not present

## 2024-07-04 DIAGNOSIS — M25552 Pain in left hip: Secondary | ICD-10-CM | POA: Diagnosis not present

## 2024-07-04 DIAGNOSIS — M5459 Other low back pain: Secondary | ICD-10-CM | POA: Diagnosis not present

## 2024-07-07 DIAGNOSIS — M25551 Pain in right hip: Secondary | ICD-10-CM | POA: Diagnosis not present

## 2024-07-07 DIAGNOSIS — M5459 Other low back pain: Secondary | ICD-10-CM | POA: Diagnosis not present

## 2024-07-07 DIAGNOSIS — M25552 Pain in left hip: Secondary | ICD-10-CM | POA: Diagnosis not present

## 2024-07-11 DIAGNOSIS — M25552 Pain in left hip: Secondary | ICD-10-CM | POA: Diagnosis not present

## 2024-07-11 DIAGNOSIS — M5459 Other low back pain: Secondary | ICD-10-CM | POA: Diagnosis not present

## 2024-07-11 DIAGNOSIS — M25551 Pain in right hip: Secondary | ICD-10-CM | POA: Diagnosis not present

## 2024-07-12 DIAGNOSIS — E1169 Type 2 diabetes mellitus with other specified complication: Secondary | ICD-10-CM | POA: Diagnosis not present

## 2024-07-12 DIAGNOSIS — H04123 Dry eye syndrome of bilateral lacrimal glands: Secondary | ICD-10-CM | POA: Diagnosis not present

## 2024-07-14 DIAGNOSIS — M25551 Pain in right hip: Secondary | ICD-10-CM | POA: Diagnosis not present

## 2024-07-14 DIAGNOSIS — M5459 Other low back pain: Secondary | ICD-10-CM | POA: Diagnosis not present

## 2024-07-14 DIAGNOSIS — M25552 Pain in left hip: Secondary | ICD-10-CM | POA: Diagnosis not present

## 2024-07-21 DIAGNOSIS — H0288B Meibomian gland dysfunction left eye, upper and lower eyelids: Secondary | ICD-10-CM | POA: Diagnosis not present

## 2024-07-21 DIAGNOSIS — M25552 Pain in left hip: Secondary | ICD-10-CM | POA: Diagnosis not present

## 2024-07-21 DIAGNOSIS — H0288A Meibomian gland dysfunction right eye, upper and lower eyelids: Secondary | ICD-10-CM | POA: Diagnosis not present

## 2024-07-21 DIAGNOSIS — H16223 Keratoconjunctivitis sicca, not specified as Sjogren's, bilateral: Secondary | ICD-10-CM | POA: Diagnosis not present

## 2024-07-21 DIAGNOSIS — H5713 Ocular pain, bilateral: Secondary | ICD-10-CM | POA: Diagnosis not present

## 2024-07-21 DIAGNOSIS — H16143 Punctate keratitis, bilateral: Secondary | ICD-10-CM | POA: Diagnosis not present

## 2024-07-21 DIAGNOSIS — M25551 Pain in right hip: Secondary | ICD-10-CM | POA: Diagnosis not present

## 2024-07-21 DIAGNOSIS — M5459 Other low back pain: Secondary | ICD-10-CM | POA: Diagnosis not present

## 2024-07-25 DIAGNOSIS — M5459 Other low back pain: Secondary | ICD-10-CM | POA: Diagnosis not present

## 2024-07-25 DIAGNOSIS — M25551 Pain in right hip: Secondary | ICD-10-CM | POA: Diagnosis not present

## 2024-07-25 DIAGNOSIS — M25552 Pain in left hip: Secondary | ICD-10-CM | POA: Diagnosis not present

## 2024-07-27 DIAGNOSIS — E1169 Type 2 diabetes mellitus with other specified complication: Secondary | ICD-10-CM | POA: Diagnosis not present

## 2024-07-27 DIAGNOSIS — Z23 Encounter for immunization: Secondary | ICD-10-CM | POA: Diagnosis not present

## 2024-07-27 DIAGNOSIS — F410 Panic disorder [episodic paroxysmal anxiety] without agoraphobia: Secondary | ICD-10-CM | POA: Diagnosis not present

## 2024-07-27 DIAGNOSIS — I10 Essential (primary) hypertension: Secondary | ICD-10-CM | POA: Diagnosis not present

## 2024-07-28 DIAGNOSIS — M25552 Pain in left hip: Secondary | ICD-10-CM | POA: Diagnosis not present

## 2024-07-28 DIAGNOSIS — M25551 Pain in right hip: Secondary | ICD-10-CM | POA: Diagnosis not present

## 2024-07-28 DIAGNOSIS — M5459 Other low back pain: Secondary | ICD-10-CM | POA: Diagnosis not present

## 2024-08-08 DIAGNOSIS — H16143 Punctate keratitis, bilateral: Secondary | ICD-10-CM | POA: Diagnosis not present

## 2024-08-08 DIAGNOSIS — E119 Type 2 diabetes mellitus without complications: Secondary | ICD-10-CM | POA: Diagnosis not present

## 2024-08-08 DIAGNOSIS — H04123 Dry eye syndrome of bilateral lacrimal glands: Secondary | ICD-10-CM | POA: Diagnosis not present

## 2024-08-08 LAB — OPHTHALMOLOGY REPORT-SCANNED

## 2024-08-10 DIAGNOSIS — M25551 Pain in right hip: Secondary | ICD-10-CM | POA: Diagnosis not present

## 2024-08-10 DIAGNOSIS — M5459 Other low back pain: Secondary | ICD-10-CM | POA: Diagnosis not present

## 2024-08-10 DIAGNOSIS — M25552 Pain in left hip: Secondary | ICD-10-CM | POA: Diagnosis not present

## 2024-08-15 DIAGNOSIS — M25552 Pain in left hip: Secondary | ICD-10-CM | POA: Diagnosis not present

## 2024-08-15 DIAGNOSIS — M25551 Pain in right hip: Secondary | ICD-10-CM | POA: Diagnosis not present

## 2024-08-15 DIAGNOSIS — M5459 Other low back pain: Secondary | ICD-10-CM | POA: Diagnosis not present

## 2024-08-19 DIAGNOSIS — M25552 Pain in left hip: Secondary | ICD-10-CM | POA: Diagnosis not present

## 2024-08-19 DIAGNOSIS — M25551 Pain in right hip: Secondary | ICD-10-CM | POA: Diagnosis not present

## 2024-08-19 DIAGNOSIS — M5459 Other low back pain: Secondary | ICD-10-CM | POA: Diagnosis not present

## 2024-09-14 ENCOUNTER — Other Ambulatory Visit: Payer: Self-pay | Admitting: Internal Medicine

## 2024-11-07 ENCOUNTER — Ambulatory Visit: Admitting: Internal Medicine

## 2025-06-05 ENCOUNTER — Ambulatory Visit: Admitting: Adult Health
